# Patient Record
Sex: Male | Born: 1950
Health system: Southern US, Community
[De-identification: ages and names within clinical notes are randomized; demographics above are authoritative.]

## PROBLEM LIST (undated history)

## (undated) ENCOUNTER — Emergency Department (HOSPITAL_COMMUNITY): Admission: EM | Payer: BC Managed Care – PPO | Source: Home / Self Care

## (undated) DIAGNOSIS — E785 Hyperlipidemia, unspecified: Secondary | ICD-10-CM

## (undated) DIAGNOSIS — K5900 Constipation, unspecified: Secondary | ICD-10-CM

## (undated) DIAGNOSIS — G4733 Obstructive sleep apnea (adult) (pediatric): Secondary | ICD-10-CM

## (undated) DIAGNOSIS — I351 Nonrheumatic aortic (valve) insufficiency: Secondary | ICD-10-CM

## (undated) DIAGNOSIS — D649 Anemia, unspecified: Secondary | ICD-10-CM

## (undated) DIAGNOSIS — E559 Vitamin D deficiency, unspecified: Secondary | ICD-10-CM

## (undated) DIAGNOSIS — G8929 Other chronic pain: Secondary | ICD-10-CM

## (undated) DIAGNOSIS — I2510 Atherosclerotic heart disease of native coronary artery without angina pectoris: Secondary | ICD-10-CM

## (undated) DIAGNOSIS — F339 Major depressive disorder, recurrent, unspecified: Secondary | ICD-10-CM

## (undated) DIAGNOSIS — F419 Anxiety disorder, unspecified: Secondary | ICD-10-CM

## (undated) DIAGNOSIS — G2581 Restless legs syndrome: Secondary | ICD-10-CM

## (undated) DIAGNOSIS — G4761 Periodic limb movement disorder: Secondary | ICD-10-CM

## (undated) DIAGNOSIS — F429 Obsessive-compulsive disorder, unspecified: Secondary | ICD-10-CM

## (undated) DIAGNOSIS — E669 Obesity, unspecified: Secondary | ICD-10-CM

## (undated) DIAGNOSIS — T7840XA Allergy, unspecified, initial encounter: Secondary | ICD-10-CM

## (undated) DIAGNOSIS — R7303 Prediabetes: Secondary | ICD-10-CM

## (undated) DIAGNOSIS — I6529 Occlusion and stenosis of unspecified carotid artery: Secondary | ICD-10-CM

## (undated) DIAGNOSIS — E7800 Pure hypercholesterolemia, unspecified: Secondary | ICD-10-CM

## (undated) DIAGNOSIS — E739 Lactose intolerance, unspecified: Secondary | ICD-10-CM

## (undated) DIAGNOSIS — G4730 Sleep apnea, unspecified: Secondary | ICD-10-CM

## (undated) HISTORY — DX: Anemia, unspecified: D64.9

## (undated) HISTORY — DX: Allergy, unspecified, initial encounter: T78.40XA

## (undated) HISTORY — DX: Obstructive sleep apnea (adult) (pediatric): G47.33

## (undated) HISTORY — DX: Periodic limb movement disorder: G47.61

## (undated) HISTORY — DX: Obesity, unspecified: E66.9

## (undated) HISTORY — DX: Restless legs syndrome: G25.81

## (undated) HISTORY — DX: Major depressive disorder, recurrent, unspecified: F33.9

## (undated) HISTORY — DX: Occlusion and stenosis of unspecified carotid artery: I65.29

## (undated) HISTORY — DX: Sleep apnea, unspecified: G47.30

## (undated) HISTORY — DX: Pure hypercholesterolemia, unspecified: E78.00

## (undated) HISTORY — DX: Obsessive-compulsive disorder, unspecified: F42.9

## (undated) HISTORY — PX: COLONOSCOPY: SHX174

## (undated) HISTORY — DX: Atherosclerotic heart disease of native coronary artery without angina pectoris: I25.10

## (undated) HISTORY — PX: HEMORRHOID SURGERY: SHX153

## (undated) HISTORY — PX: DG BARIUM ENEMA (ARMC HX): HXRAD1447

## (undated) HISTORY — DX: Vitamin D deficiency, unspecified: E55.9

## (undated) HISTORY — DX: Hyperlipidemia, unspecified: E78.5

## (undated) HISTORY — DX: Constipation, unspecified: K59.00

## (undated) HISTORY — DX: Lactose intolerance, unspecified: E73.9

## (undated) HISTORY — DX: Nonrheumatic aortic (valve) insufficiency: I35.1

## (undated) HISTORY — DX: Other chronic pain: G89.29

## (undated) HISTORY — DX: Anxiety disorder, unspecified: F41.9

## (undated) HISTORY — DX: Prediabetes: R73.03

---
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
## 1998-11-11 ENCOUNTER — Ambulatory Visit (HOSPITAL_COMMUNITY): Admission: RE | Admit: 1998-11-11 | Discharge: 1998-11-11 | Payer: Self-pay | Admitting: Neurology

## 1998-11-11 ENCOUNTER — Encounter: Payer: Self-pay | Admitting: Neurology

## 1999-02-27 ENCOUNTER — Encounter: Admission: RE | Admit: 1999-02-27 | Discharge: 1999-05-28 | Payer: Self-pay | Admitting: Family Medicine

## 1999-06-14 ENCOUNTER — Encounter: Admission: RE | Admit: 1999-06-14 | Discharge: 1999-09-12 | Payer: Self-pay | Admitting: Family Medicine

## 1999-11-09 ENCOUNTER — Encounter: Admission: RE | Admit: 1999-11-09 | Discharge: 2000-02-07 | Payer: Self-pay | Admitting: Family Medicine

## 2000-12-11 ENCOUNTER — Emergency Department (HOSPITAL_COMMUNITY): Admission: EM | Admit: 2000-12-11 | Discharge: 2000-12-11 | Payer: Self-pay

## 2003-02-08 ENCOUNTER — Ambulatory Visit (HOSPITAL_BASED_OUTPATIENT_CLINIC_OR_DEPARTMENT_OTHER): Admission: RE | Admit: 2003-02-08 | Discharge: 2003-02-08 | Payer: Self-pay | Admitting: Pulmonary Disease

## 2003-09-26 ENCOUNTER — Ambulatory Visit (HOSPITAL_BASED_OUTPATIENT_CLINIC_OR_DEPARTMENT_OTHER): Admission: RE | Admit: 2003-09-26 | Discharge: 2003-09-26 | Payer: Self-pay | Admitting: Pulmonary Disease

## 2003-12-15 ENCOUNTER — Ambulatory Visit: Payer: Self-pay

## 2003-12-16 ENCOUNTER — Emergency Department (HOSPITAL_COMMUNITY): Admission: EM | Admit: 2003-12-16 | Discharge: 2003-12-16 | Payer: Self-pay | Admitting: Internal Medicine

## 2003-12-18 ENCOUNTER — Emergency Department (HOSPITAL_COMMUNITY): Admission: EM | Admit: 2003-12-18 | Discharge: 2003-12-18 | Payer: Self-pay | Admitting: Emergency Medicine

## 2003-12-27 ENCOUNTER — Ambulatory Visit: Payer: Self-pay | Admitting: Pulmonary Disease

## 2004-04-13 ENCOUNTER — Ambulatory Visit: Payer: Self-pay | Admitting: Pulmonary Disease

## 2005-05-07 ENCOUNTER — Ambulatory Visit: Payer: Self-pay | Admitting: Pulmonary Disease

## 2005-05-14 ENCOUNTER — Ambulatory Visit (HOSPITAL_BASED_OUTPATIENT_CLINIC_OR_DEPARTMENT_OTHER): Admission: RE | Admit: 2005-05-14 | Discharge: 2005-05-14 | Payer: Self-pay | Admitting: Pulmonary Disease

## 2005-05-29 ENCOUNTER — Ambulatory Visit: Payer: Self-pay | Admitting: Pulmonary Disease

## 2005-07-17 ENCOUNTER — Emergency Department (HOSPITAL_COMMUNITY): Admission: EM | Admit: 2005-07-17 | Discharge: 2005-07-17 | Payer: Self-pay | Admitting: Emergency Medicine

## 2005-07-20 ENCOUNTER — Encounter (HOSPITAL_COMMUNITY): Admission: RE | Admit: 2005-07-20 | Discharge: 2005-10-04 | Payer: Self-pay | Admitting: Emergency Medicine

## 2006-09-09 ENCOUNTER — Ambulatory Visit: Payer: Self-pay | Admitting: Surgery

## 2010-03-17 ENCOUNTER — Emergency Department (HOSPITAL_COMMUNITY)
Admission: EM | Admit: 2010-03-17 | Discharge: 2010-03-18 | Disposition: A | Discharge status: Home / Self Care | Payer: BC Managed Care – PPO | Attending: Emergency Medicine | Admitting: Emergency Medicine

## 2010-03-17 DIAGNOSIS — Z79899 Other long term (current) drug therapy: Secondary | ICD-10-CM | POA: Insufficient documentation

## 2010-03-17 DIAGNOSIS — Z7982 Long term (current) use of aspirin: Secondary | ICD-10-CM | POA: Insufficient documentation

## 2010-03-17 DIAGNOSIS — K648 Other hemorrhoids: Secondary | ICD-10-CM | POA: Insufficient documentation

## 2010-06-06 NOTE — Procedures (Signed)
CAROTID DUPLEX EXAM   INDICATION:  Follow up carotid artery disease.   HISTORY:  Diabetes:  No  Cardiac:  No  Smoking:  No  Previous Surgery:  No  CV History:  Amaurosis Fugax No, Paresthesias No, Hemiparesis No   The patient reports multiple episodes of visual disturbances as if heat  waves are coming off pavement.  Also had dysarthria in past.                                       RIGHT             LEFT  Brachial systolic pressure:         140               142  Brachial Doppler waveforms:         Triphasic         Triphasic  Vertebral direction of flow:        Antegrade         Antegrade  DUPLEX VELOCITIES (cm/sec)  CCA peak systolic                   97                114  ECA peak systolic                   128               159  ICA peak systolic                   93                101  ICA end diastolic                   19                32  PLAQUE MORPHOLOGY:                  Intimal thickening                  Mixed  PLAQUE AMOUNT:                      None              Mild  PLAQUE LOCATION:                                      ICA/bifurcation   IMPRESSION:  No evidence of right ICA stenosis.  Evidence of 1-39% left ICA stenosis.  No significant changes from previous study.   ___________________________________________  Janetta Hora Fields, MD   AS/MEDQ  D:  09/09/2006  T:  09/10/2006  Job:  [id number]   cc:   Duncan Dull, M.D.

## 2010-06-09 NOTE — Procedures (Signed)
NAME:  Eric [name], VILAR NO.:  [id number]   MEDICAL RECORD NO.:  [id number]          PATIENT TYPE:  OUT   LOCATION:  SLEEP CENTER                 FACILITY:  [facility]   PHYSICIAN:  Marcelyn Bruins, M.D. [facility] DATE OF BIRTH:  [birth year]   DATE OF STUDY:  05/14/2005                              NOCTURNAL POLYSOMNOGRAM   REFERRING PHYSICIAN:  Dr. Danice Goltz   INDICATIONS FOR STUDY:  Hypersomnia with sleep apnea.  The patient returns  for pressure optimization.   EPWORTH SCORE:  11.   SLEEP ARCHITECTURE:  The patient had total sleep time of 378 minutes with  adequate REM but never achieved slow wave sleep.  Sleep onset latency was  normal and REM onset was at the upper limits of normal.  Sleep efficiency  was 92%.   RESPIRATORY DATA:  The patient underwent some CPAP titration by protocol  with his medium ResMed Ultra Mirage full face mask from home.  The patient  was titrated incrementally over the night in order to suppress both  obstructive events and snoring.  The final pressure of 17 cm of water the  patient seemed to have a very good response.   OXYGEN DATA:  There was O2 desaturation as low as 74% prior to achieving  optimal CPAP.   CARDIAC DATA:  Rare PAC.   MOVEMENT/PARASOMNIAS:  The patient was found to have 456 leg jerks with  seven per hour resulting in arousal awakening.   IMPRESSION/RECOMMENDATIONS:  1.  Good control of previously documented obstructive sleep apnea with 718      cm of CPAP and the patient's medium ResMed Ultra Mirage full face mask      from home.  2.  Very large numbers of leg jerks with significant sleep disruption that      did not resolve even on optimal CPAP.      It did improve however.  I have noted the patient is on Requip at home,      and perhaps even needs a higher dose or possibly his leg jerks are due      to another etiology.  Clinical correlation is suggested.           ______________________________  Marcelyn Bruins,  M.D. [facility]  Diplomate, American Board of Sleep  Medicine    KC/MEDQ  D:  05/29/2005 15:15:51  T:  05/30/2005 13:18:51  Job:  [id number]

## 2010-06-09 NOTE — Procedures (Signed)
NAME:  Eric [name], Eric [name] NO.:  [id number]   MEDICAL RECORD NO.:  [id number]          PATIENT TYPE:  OUT   LOCATION:  SLEEP CENTER                 FACILITY:  [facility]   PHYSICIAN:  Marcelyn Bruins, M.D. [facility] DATE OF BIRTH:  1950/09/26   DATE OF ADMISSION:  09/26/2003  DATE OF DISCHARGE:  09/26/2003                              NOCTURNAL POLYSOMNOGRAM   REFERRING PHYSICIAN:  Dr. Danice Goltz   INDICATION FOR THE STUDY:  The patient has a history of obstructive sleep  apnea and is returning for CPAP titration.   SLEEP ARCHITECTURE:  The patient had a total sleep time of 408 minutes with  decreased REM and slow wave sleep.  Sleep onset latency was prolonged at 61  minutes and REM latency was greatly prolonged at 219 minutes.   IMPRESSION:  1.  Moderate control of obstructive sleep apnea with a CPAP pressure of 7      cm.  The patient had definite breakthrough toward the end of the study      in the supine position and REM.  I would suggest a treatment pressure of      8-9 cm.  Patient used his ResMed Ultra Mirage mask from home.  2.  No clinically significant cardiac arrhythmias.  3.  Very large numbers of leg jerks with significant sleep disruption.      Clinical correlation is suggested.      KC/MEDQ  D:  10/14/2003 15:53:31  T:  10/15/2003 18:25:12  Job:  [id number]

## 2010-10-19 ENCOUNTER — Other Ambulatory Visit: Payer: Self-pay | Admitting: Gastroenterology

## 2010-10-19 ENCOUNTER — Other Ambulatory Visit (HOSPITAL_COMMUNITY): Payer: Self-pay | Admitting: Gastroenterology

## 2010-10-19 ENCOUNTER — Ambulatory Visit (HOSPITAL_COMMUNITY)
Admission: RE | Admit: 2010-10-19 | Discharge: 2010-10-19 | Disposition: A | Discharge status: Home / Self Care | Payer: BC Managed Care – PPO | Source: Ambulatory Visit | Attending: Gastroenterology | Admitting: Gastroenterology

## 2010-10-19 DIAGNOSIS — Z9889 Other specified postprocedural states: Secondary | ICD-10-CM

## 2010-10-19 DIAGNOSIS — K5730 Diverticulosis of large intestine without perforation or abscess without bleeding: Secondary | ICD-10-CM | POA: Insufficient documentation

## 2010-10-19 DIAGNOSIS — R195 Other fecal abnormalities: Secondary | ICD-10-CM | POA: Insufficient documentation

## 2011-04-13 ENCOUNTER — Telehealth: Payer: Self-pay | Admitting: *Deleted

## 2011-04-13 NOTE — Telephone Encounter (Signed)
Pt called requesting a genetics appt due to strong family hx because he has a daughter asking.  Confirmed 04/19/11 appt w/ pt.

## 2011-04-19 ENCOUNTER — Other Ambulatory Visit: Payer: BC Managed Care – PPO

## 2011-04-19 ENCOUNTER — Ambulatory Visit (HOSPITAL_BASED_OUTPATIENT_CLINIC_OR_DEPARTMENT_OTHER): Payer: BC Managed Care – PPO | Admitting: Genetic Counselor

## 2011-04-19 DIAGNOSIS — Z8041 Family history of malignant neoplasm of ovary: Secondary | ICD-10-CM

## 2011-04-19 DIAGNOSIS — Z809 Family history of malignant neoplasm, unspecified: Secondary | ICD-10-CM

## 2011-04-19 DIAGNOSIS — Z803 Family history of malignant neoplasm of breast: Secondary | ICD-10-CM

## 2011-04-19 DIAGNOSIS — IMO0002 Reserved for concepts with insufficient information to code with codable children: Secondary | ICD-10-CM

## 2011-04-19 NOTE — Progress Notes (Signed)
Dr.  Kevan Ny requested a consultation for genetic counseling and risk assessment for Eric [name], a 61 y.o. male, for discussion of his family history of cancer. He presents to clinic today, with his wife, to discuss the possibility of a genetic predisposition to cancer, and to further clarify his risks, as well as his family members' risks for cancer.   HISTORY OF PRESENT ILLNESS: Eric [name] is a 61 y.o. male with no personal history of cancer.    No past medical history on file.  No past surgical history on file.  History  Substance Use Topics  . Smoking status: Not on file  . Smokeless tobacco: Not on file  . Alcohol Use: Not on file    FAMILY HISTORY:  We obtained a detailed, 4-generation family history.  Significant diagnoses are listed below: The patient's mother was diagnosed with breast cancer at age 59 and then with ovarian cancer at age 27.  She died at age 12.  He has a maternal cousin who died of pancreatic cancer in his 26s and a maternal cousin who died of breast cancer in her 68s.  There is no other reported family history of cancer.  Patient's maternal ancestors are of [place] descent, and paternal ancestors are of [place] descent. There is no reported Ashkenazi Jewish ancestry. There is no  known consanguinity.  GENETIC COUNSELING RISK ASSESSMENT, DISCUSSION, AND SUGGESTED FOLLOW UP: We reviewed the natural history and genetic etiology of sporadic, familial and hereditary cancer syndromes.  Approximately 5-10% of breast cancer is hereditary; of this, about 85% is a result of BRCA1 or BRCA2 mutations.  We discussed the red flags of hereditary cancer syndromes and the dominant inheritance patterns.  The patient was concerned about repercussions of testing for his health insurance.  We reviewed the [place] law, and explained that insurance companies are willing to pay for testing for individuals who meet specific criteria.  The patient had lots of questions  about risks.  The patient's family history is suggestive of the following possible diagnosis: hereditary breast and ovarian cancer.  We discussed that identification of a hereditary cancer syndrome may help his care providers tailor the patients medical management. If a mutation indicating BRCA1 or BRCA2 mutation is detected in this case, the [employer] recommendations would include increased cancer screening. If a mutation is detected, the patient will be referred back to the referring provider and to any additional appropriate care providers to discuss the relevant options.   If a mutation is not found in the patient, this will decrease the likelihood of BRCA mutations as the explanation for his  Family history of cancer. Cancer surveillance options would be discussed for the patient according to the appropriate standard National Comprehensive Cancer Network and American Cancer Society guidelines, with consideration of their personal and family history risk factors. In this case, the patient will be referred back to their care providers for discussions of management.   Based on the patient's personal and family history, statistical models (Penn II)  and literature data were used to estimate his risk of having a BRCA1 or BRCA2 mutation.  This estimates that his risk for a BRCA1 mutation is approximately 4% and the risk for a BRCA2 mutation is 6%, for a combined total of 10% risk.  Based on this estimate genetic testing is recommended.   After considering the risks, benefits, and limitations, the patient decided to think about the test and discuss it with his family members.  The patient was seen for a total of 90 minutes, greater than 50% of which was spent face-to-face counseling.  This plan is being carried out per Dr. Kevan Ny' recommendations.  This note will also be sent to the referring provider via the electronic medical record. The patient will be supplied with a summary  of this genetic counseling discussion as well as educational information on the discussed hereditary cancer syndromes following the conclusion of their visit.   Patient was discussed with Dr. Drue Second.  EDUCATIONAL INFORMATION SUPPLIED TO PATIENT AT ENCOUNTER:  Hereditary Breast and Ovarian Cancer Syndrome brochure  _______________________________________________________________________ For Office Staff:  Number of people involved in session: 3 Was an Intern/ student involved with case: not applicable }

## 2011-05-29 ENCOUNTER — Encounter: Payer: Self-pay | Admitting: Genetic Counselor

## 2011-05-31 ENCOUNTER — Telehealth: Payer: Self-pay | Admitting: Oncology

## 2011-05-31 ENCOUNTER — Other Ambulatory Visit: Payer: BC Managed Care – PPO | Admitting: Lab

## 2011-05-31 NOTE — Telephone Encounter (Signed)
Pt called to schedule his bra1/bfca 2 lab appt. Transferred the pt over to lisa to set up that appt

## 2011-06-05 ENCOUNTER — Telehealth: Payer: Self-pay | Admitting: Genetic Counselor

## 2011-06-05 NOTE — Telephone Encounter (Signed)
Spoke with Rudi Rummage wife, about needing a copy of the insurance card in order to [employer] for genetic testing.  She will fax me over a copy of the insurance card.

## 2011-06-21 ENCOUNTER — Telehealth: Payer: Self-pay | Admitting: Genetic Counselor

## 2011-06-21 ENCOUNTER — Encounter: Payer: Self-pay | Admitting: Genetic Counselor

## 2011-06-21 NOTE — Telephone Encounter (Signed)
Revealed negative BRCA and BART test results. 

## 2011-06-26 ENCOUNTER — Encounter: Payer: Self-pay | Admitting: Genetic Counselor

## 2012-10-14 ENCOUNTER — Other Ambulatory Visit: Payer: Self-pay | Admitting: Family Medicine

## 2012-10-14 DIAGNOSIS — I779 Disorder of arteries and arterioles, unspecified: Secondary | ICD-10-CM

## 2012-10-15 ENCOUNTER — Ambulatory Visit
Admission: RE | Admit: 2012-10-15 | Discharge: 2012-10-15 | Disposition: A | Discharge status: Home / Self Care | Payer: BC Managed Care – PPO | Source: Ambulatory Visit | Attending: Family Medicine | Admitting: Family Medicine

## 2012-10-15 DIAGNOSIS — I779 Disorder of arteries and arterioles, unspecified: Secondary | ICD-10-CM

## 2012-10-22 ENCOUNTER — Encounter: Payer: Self-pay | Admitting: Vascular Surgery

## 2012-10-28 ENCOUNTER — Ambulatory Visit: Payer: BC Managed Care – PPO | Admitting: Interventional Cardiology

## 2012-11-11 ENCOUNTER — Ambulatory Visit: Payer: BC Managed Care – PPO | Admitting: Interventional Cardiology

## 2012-11-19 ENCOUNTER — Encounter: Payer: Self-pay | Admitting: Vascular Surgery

## 2012-11-20 ENCOUNTER — Encounter: Payer: BC Managed Care – PPO | Admitting: Vascular Surgery

## 2012-12-16 ENCOUNTER — Encounter: Payer: BC Managed Care – PPO | Admitting: Vascular Surgery

## 2012-12-22 ENCOUNTER — Ambulatory Visit: Payer: BC Managed Care – PPO | Admitting: Interventional Cardiology

## 2014-01-05 ENCOUNTER — Ambulatory Visit (INDEPENDENT_AMBULATORY_CARE_PROVIDER_SITE_OTHER): Payer: BC Managed Care – PPO | Admitting: Podiatry

## 2014-01-05 ENCOUNTER — Ambulatory Visit (INDEPENDENT_AMBULATORY_CARE_PROVIDER_SITE_OTHER): Payer: BC Managed Care – PPO

## 2014-01-05 ENCOUNTER — Encounter: Payer: Self-pay | Admitting: Podiatry

## 2014-01-05 DIAGNOSIS — M722 Plantar fascial fibromatosis: Secondary | ICD-10-CM

## 2014-01-05 DIAGNOSIS — L603 Nail dystrophy: Secondary | ICD-10-CM

## 2014-01-05 DIAGNOSIS — R52 Pain, unspecified: Secondary | ICD-10-CM

## 2014-01-05 DIAGNOSIS — L600 Ingrowing nail: Secondary | ICD-10-CM

## 2014-01-05 NOTE — Progress Notes (Signed)
   Subjective:    Patient ID: Eric [name], male    DOB: 1950/05/14, 63 y.o.   MRN: [id number]  HPI  PT STATED LT FOOT BACK OF THE HEEL BEEN PAINFUL FOR ON AND OFF SINCE 2013. THE HEEL IS BEEN THE SAME BUT SOMETIMES GET WORSE. THE FOOT GET AGGRAVATED BY WALKING AND TRIED WEARING ORTHOTICS.  ALSO, CHECK LT 3RD TOENAIL IS THICK.  Review of Systems  Skin: Positive for color change.  All other systems reviewed and are negative.      Objective:   Physical Exam: I have reviewed his past medical history medications allergies surgery social history and review of systems. Pulses are strongly palpable bilateral. Neurologic sensorium is intact per Semmes-Weinstein monofilament. Deep tendon reflexes are intact bilaterally muscle strength is 5 over 5 dorsiflexion plantar flexion and inversion everters all edges of musculature is intact. Orthopedic evaluation demonstrates all joints distal to the angle full range of motion without crepitation. He has mild tenderness on palpation of the medial calcaneal tubercle left greater than right. Cutaneous evaluation Mr. supple hydrated uterus with exception of the third digital nail plate left which restraints discoloration as well as a raised appearance that extends from proximal to distal. No tinea pedis. He does relate jock itch.        Assessment & Plan:  Assessment: Plantar fasciitis bilateral. Nail dystrophy with ingrown nail third digit left foot.  Plan: He was scanned for another set of orthotics today. We removed the toenail today in total and dressed with a dry sterile compressive dressing at which time he received metformin home-going instructions for the care of the toe and he will start topical antifungal once the wound heals. I will follow-up with him and his orthotics come in.

## 2014-01-05 NOTE — Patient Instructions (Signed)

## 2014-01-12 ENCOUNTER — Encounter: Payer: Self-pay | Admitting: Podiatry

## 2014-01-12 ENCOUNTER — Ambulatory Visit (INDEPENDENT_AMBULATORY_CARE_PROVIDER_SITE_OTHER): Payer: BC Managed Care – PPO | Admitting: Podiatry

## 2014-01-12 VITALS — BP 125/72 | HR 70 | Resp 16

## 2014-01-12 DIAGNOSIS — Z9889 Other specified postprocedural states: Secondary | ICD-10-CM

## 2014-01-12 NOTE — Progress Notes (Signed)
He presents today for follow-up of his nail avulsion third digit left foot. He states that seems to be healing. He is ready to start applying the econazole cream.  Objective: Vital signs are stable he is alert and oriented 3 there is no erythema edema cellulitis drainage or odor the nail bed appears to be healing well. There is be healing well.  Assessment: Well-healing surgical toe third left.  Plan: Continue so Epsom salts and warm water until completely dried out. He will then start application of a consult cream twice daily until the nail has grown out completely. Should this recur he will contemplate total nail avulsion with matrixectomy.

## 2014-06-25 ENCOUNTER — Other Ambulatory Visit: Payer: Self-pay

## 2014-06-25 ENCOUNTER — Ambulatory Visit: Payer: Self-pay | Admitting: *Deleted

## 2014-06-25 DIAGNOSIS — M722 Plantar fascial fibromatosis: Secondary | ICD-10-CM

## 2014-06-25 NOTE — Progress Notes (Signed)
Pt is here to PUO 

## 2014-06-25 NOTE — Patient Instructions (Signed)

## 2015-05-16 DIAGNOSIS — G4733 Obstructive sleep apnea (adult) (pediatric): Secondary | ICD-10-CM | POA: Diagnosis not present

## 2015-05-16 DIAGNOSIS — G2581 Restless legs syndrome: Secondary | ICD-10-CM | POA: Diagnosis not present

## 2015-05-19 DIAGNOSIS — G4733 Obstructive sleep apnea (adult) (pediatric): Secondary | ICD-10-CM | POA: Diagnosis not present

## 2015-06-06 DIAGNOSIS — G4733 Obstructive sleep apnea (adult) (pediatric): Secondary | ICD-10-CM | POA: Diagnosis not present

## 2015-07-28 DIAGNOSIS — G4761 Periodic limb movement disorder: Secondary | ICD-10-CM | POA: Diagnosis not present

## 2015-07-28 DIAGNOSIS — G4733 Obstructive sleep apnea (adult) (pediatric): Secondary | ICD-10-CM | POA: Diagnosis not present

## 2015-07-28 DIAGNOSIS — G2581 Restless legs syndrome: Secondary | ICD-10-CM | POA: Diagnosis not present

## 2015-08-17 DIAGNOSIS — E669 Obesity, unspecified: Secondary | ICD-10-CM | POA: Diagnosis not present

## 2015-09-07 DIAGNOSIS — E669 Obesity, unspecified: Secondary | ICD-10-CM | POA: Diagnosis not present

## 2015-10-11 DIAGNOSIS — Z23 Encounter for immunization: Secondary | ICD-10-CM | POA: Diagnosis not present

## 2015-10-12 DIAGNOSIS — E669 Obesity, unspecified: Secondary | ICD-10-CM | POA: Diagnosis not present

## 2015-11-04 DIAGNOSIS — G4733 Obstructive sleep apnea (adult) (pediatric): Secondary | ICD-10-CM | POA: Diagnosis not present

## 2015-11-11 DIAGNOSIS — I6522 Occlusion and stenosis of left carotid artery: Secondary | ICD-10-CM | POA: Diagnosis not present

## 2015-11-11 DIAGNOSIS — I779 Disorder of arteries and arterioles, unspecified: Secondary | ICD-10-CM | POA: Diagnosis not present

## 2015-11-23 DIAGNOSIS — E669 Obesity, unspecified: Secondary | ICD-10-CM | POA: Diagnosis not present

## 2015-11-28 DIAGNOSIS — H2513 Age-related nuclear cataract, bilateral: Secondary | ICD-10-CM | POA: Diagnosis not present

## 2015-11-28 DIAGNOSIS — H5213 Myopia, bilateral: Secondary | ICD-10-CM | POA: Diagnosis not present

## 2015-11-28 DIAGNOSIS — H52203 Unspecified astigmatism, bilateral: Secondary | ICD-10-CM | POA: Diagnosis not present

## 2015-11-28 DIAGNOSIS — D3131 Benign neoplasm of right choroid: Secondary | ICD-10-CM | POA: Diagnosis not present

## 2015-12-05 DIAGNOSIS — G4733 Obstructive sleep apnea (adult) (pediatric): Secondary | ICD-10-CM | POA: Diagnosis not present

## 2015-12-07 DIAGNOSIS — G4733 Obstructive sleep apnea (adult) (pediatric): Secondary | ICD-10-CM | POA: Diagnosis not present

## 2015-12-08 DIAGNOSIS — L821 Other seborrheic keratosis: Secondary | ICD-10-CM | POA: Diagnosis not present

## 2015-12-08 DIAGNOSIS — L814 Other melanin hyperpigmentation: Secondary | ICD-10-CM | POA: Diagnosis not present

## 2015-12-08 DIAGNOSIS — D225 Melanocytic nevi of trunk: Secondary | ICD-10-CM | POA: Diagnosis not present

## 2015-12-08 DIAGNOSIS — L570 Actinic keratosis: Secondary | ICD-10-CM | POA: Diagnosis not present

## 2015-12-08 DIAGNOSIS — D1801 Hemangioma of skin and subcutaneous tissue: Secondary | ICD-10-CM | POA: Diagnosis not present

## 2015-12-14 DIAGNOSIS — Z13228 Encounter for screening for other metabolic disorders: Secondary | ICD-10-CM | POA: Diagnosis not present

## 2015-12-14 DIAGNOSIS — Z0000 Encounter for general adult medical examination without abnormal findings: Secondary | ICD-10-CM | POA: Diagnosis not present

## 2015-12-14 DIAGNOSIS — E7800 Pure hypercholesterolemia, unspecified: Secondary | ICD-10-CM | POA: Diagnosis not present

## 2015-12-14 DIAGNOSIS — R7303 Prediabetes: Secondary | ICD-10-CM | POA: Diagnosis not present

## 2015-12-19 DIAGNOSIS — E7800 Pure hypercholesterolemia, unspecified: Secondary | ICD-10-CM | POA: Diagnosis not present

## 2015-12-19 DIAGNOSIS — K921 Melena: Secondary | ICD-10-CM | POA: Diagnosis not present

## 2015-12-19 DIAGNOSIS — E669 Obesity, unspecified: Secondary | ICD-10-CM | POA: Diagnosis not present

## 2015-12-19 DIAGNOSIS — Z0000 Encounter for general adult medical examination without abnormal findings: Secondary | ICD-10-CM | POA: Diagnosis not present

## 2015-12-19 DIAGNOSIS — R7303 Prediabetes: Secondary | ICD-10-CM | POA: Diagnosis not present

## 2016-01-04 DIAGNOSIS — E669 Obesity, unspecified: Secondary | ICD-10-CM | POA: Diagnosis not present

## 2016-01-06 DIAGNOSIS — H00016 Hordeolum externum left eye, unspecified eyelid: Secondary | ICD-10-CM | POA: Diagnosis not present

## 2016-02-01 DIAGNOSIS — E669 Obesity, unspecified: Secondary | ICD-10-CM | POA: Diagnosis not present

## 2016-02-14 DIAGNOSIS — M25561 Pain in right knee: Secondary | ICD-10-CM | POA: Diagnosis not present

## 2016-02-14 DIAGNOSIS — M170 Bilateral primary osteoarthritis of knee: Secondary | ICD-10-CM | POA: Diagnosis not present

## 2016-02-14 DIAGNOSIS — G8929 Other chronic pain: Secondary | ICD-10-CM | POA: Diagnosis not present

## 2016-02-14 DIAGNOSIS — M25562 Pain in left knee: Secondary | ICD-10-CM | POA: Diagnosis not present

## 2016-02-14 DIAGNOSIS — M1711 Unilateral primary osteoarthritis, right knee: Secondary | ICD-10-CM | POA: Diagnosis not present

## 2016-02-29 DIAGNOSIS — E669 Obesity, unspecified: Secondary | ICD-10-CM | POA: Diagnosis not present

## 2016-03-22 DIAGNOSIS — M1712 Unilateral primary osteoarthritis, left knee: Secondary | ICD-10-CM | POA: Diagnosis not present

## 2016-03-22 DIAGNOSIS — M1711 Unilateral primary osteoarthritis, right knee: Secondary | ICD-10-CM | POA: Diagnosis not present

## 2016-04-11 DIAGNOSIS — E669 Obesity, unspecified: Secondary | ICD-10-CM | POA: Diagnosis not present

## 2016-05-09 DIAGNOSIS — E669 Obesity, unspecified: Secondary | ICD-10-CM | POA: Diagnosis not present

## 2016-06-06 DIAGNOSIS — E669 Obesity, unspecified: Secondary | ICD-10-CM | POA: Diagnosis not present

## 2016-06-06 DIAGNOSIS — G4733 Obstructive sleep apnea (adult) (pediatric): Secondary | ICD-10-CM | POA: Diagnosis not present

## 2016-07-20 DIAGNOSIS — G4733 Obstructive sleep apnea (adult) (pediatric): Secondary | ICD-10-CM | POA: Diagnosis not present

## 2016-08-02 DIAGNOSIS — G4733 Obstructive sleep apnea (adult) (pediatric): Secondary | ICD-10-CM | POA: Diagnosis not present

## 2016-08-07 DIAGNOSIS — G4733 Obstructive sleep apnea (adult) (pediatric): Secondary | ICD-10-CM | POA: Diagnosis not present

## 2016-09-03 DIAGNOSIS — M1711 Unilateral primary osteoarthritis, right knee: Secondary | ICD-10-CM | POA: Diagnosis not present

## 2016-09-03 DIAGNOSIS — M25562 Pain in left knee: Secondary | ICD-10-CM | POA: Diagnosis not present

## 2016-09-03 DIAGNOSIS — M25561 Pain in right knee: Secondary | ICD-10-CM | POA: Diagnosis not present

## 2016-09-03 DIAGNOSIS — M1712 Unilateral primary osteoarthritis, left knee: Secondary | ICD-10-CM | POA: Diagnosis not present

## 2016-09-03 DIAGNOSIS — M170 Bilateral primary osteoarthritis of knee: Secondary | ICD-10-CM | POA: Diagnosis not present

## 2016-09-05 DIAGNOSIS — E669 Obesity, unspecified: Secondary | ICD-10-CM | POA: Diagnosis not present

## 2016-09-26 DIAGNOSIS — E669 Obesity, unspecified: Secondary | ICD-10-CM | POA: Diagnosis not present

## 2016-10-24 DIAGNOSIS — E669 Obesity, unspecified: Secondary | ICD-10-CM | POA: Diagnosis not present

## 2016-11-02 DIAGNOSIS — Z23 Encounter for immunization: Secondary | ICD-10-CM | POA: Diagnosis not present

## 2016-11-09 DIAGNOSIS — I6522 Occlusion and stenosis of left carotid artery: Secondary | ICD-10-CM | POA: Diagnosis not present

## 2016-11-09 DIAGNOSIS — I779 Disorder of arteries and arterioles, unspecified: Secondary | ICD-10-CM | POA: Diagnosis not present

## 2016-11-13 DIAGNOSIS — H903 Sensorineural hearing loss, bilateral: Secondary | ICD-10-CM | POA: Diagnosis not present

## 2016-12-11 DIAGNOSIS — D225 Melanocytic nevi of trunk: Secondary | ICD-10-CM | POA: Diagnosis not present

## 2016-12-11 DIAGNOSIS — L814 Other melanin hyperpigmentation: Secondary | ICD-10-CM | POA: Diagnosis not present

## 2016-12-11 DIAGNOSIS — L821 Other seborrheic keratosis: Secondary | ICD-10-CM | POA: Diagnosis not present

## 2016-12-11 DIAGNOSIS — D1801 Hemangioma of skin and subcutaneous tissue: Secondary | ICD-10-CM | POA: Diagnosis not present

## 2016-12-24 DIAGNOSIS — E7800 Pure hypercholesterolemia, unspecified: Secondary | ICD-10-CM | POA: Diagnosis not present

## 2016-12-24 DIAGNOSIS — R7303 Prediabetes: Secondary | ICD-10-CM | POA: Diagnosis not present

## 2016-12-24 DIAGNOSIS — Z125 Encounter for screening for malignant neoplasm of prostate: Secondary | ICD-10-CM | POA: Diagnosis not present

## 2016-12-24 DIAGNOSIS — Z5181 Encounter for therapeutic drug level monitoring: Secondary | ICD-10-CM | POA: Diagnosis not present

## 2016-12-24 DIAGNOSIS — E559 Vitamin D deficiency, unspecified: Secondary | ICD-10-CM | POA: Diagnosis not present

## 2016-12-26 DIAGNOSIS — E7800 Pure hypercholesterolemia, unspecified: Secondary | ICD-10-CM | POA: Diagnosis not present

## 2016-12-26 DIAGNOSIS — R8290 Unspecified abnormal findings in urine: Secondary | ICD-10-CM | POA: Diagnosis not present

## 2016-12-26 DIAGNOSIS — Z23 Encounter for immunization: Secondary | ICD-10-CM | POA: Diagnosis not present

## 2016-12-26 DIAGNOSIS — Z0000 Encounter for general adult medical examination without abnormal findings: Secondary | ICD-10-CM | POA: Diagnosis not present

## 2016-12-26 DIAGNOSIS — E559 Vitamin D deficiency, unspecified: Secondary | ICD-10-CM | POA: Diagnosis not present

## 2016-12-26 DIAGNOSIS — R739 Hyperglycemia, unspecified: Secondary | ICD-10-CM | POA: Diagnosis not present

## 2017-01-22 DIAGNOSIS — H00013 Hordeolum externum right eye, unspecified eyelid: Secondary | ICD-10-CM | POA: Diagnosis not present

## 2017-04-08 DIAGNOSIS — H52203 Unspecified astigmatism, bilateral: Secondary | ICD-10-CM | POA: Diagnosis not present

## 2017-04-08 DIAGNOSIS — H524 Presbyopia: Secondary | ICD-10-CM | POA: Diagnosis not present

## 2017-04-08 DIAGNOSIS — D3131 Benign neoplasm of right choroid: Secondary | ICD-10-CM | POA: Diagnosis not present

## 2017-04-08 DIAGNOSIS — H2513 Age-related nuclear cataract, bilateral: Secondary | ICD-10-CM | POA: Diagnosis not present

## 2017-04-08 DIAGNOSIS — H0012 Chalazion right lower eyelid: Secondary | ICD-10-CM | POA: Diagnosis not present

## 2017-04-08 DIAGNOSIS — H5213 Myopia, bilateral: Secondary | ICD-10-CM | POA: Diagnosis not present

## 2017-04-09 DIAGNOSIS — Z79899 Other long term (current) drug therapy: Secondary | ICD-10-CM | POA: Diagnosis not present

## 2017-04-09 DIAGNOSIS — F331 Major depressive disorder, recurrent, moderate: Secondary | ICD-10-CM | POA: Diagnosis not present

## 2017-05-22 DIAGNOSIS — E669 Obesity, unspecified: Secondary | ICD-10-CM | POA: Diagnosis not present

## 2017-05-30 DIAGNOSIS — H00016 Hordeolum externum left eye, unspecified eyelid: Secondary | ICD-10-CM | POA: Diagnosis not present

## 2017-05-30 DIAGNOSIS — G4733 Obstructive sleep apnea (adult) (pediatric): Secondary | ICD-10-CM | POA: Diagnosis not present

## 2017-08-08 DIAGNOSIS — G4733 Obstructive sleep apnea (adult) (pediatric): Secondary | ICD-10-CM | POA: Diagnosis not present

## 2017-08-08 DIAGNOSIS — H01009 Unspecified blepharitis unspecified eye, unspecified eyelid: Secondary | ICD-10-CM | POA: Diagnosis not present

## 2017-08-14 DIAGNOSIS — F331 Major depressive disorder, recurrent, moderate: Secondary | ICD-10-CM | POA: Diagnosis not present

## 2017-10-15 DIAGNOSIS — F429 Obsessive-compulsive disorder, unspecified: Secondary | ICD-10-CM

## 2017-10-18 ENCOUNTER — Encounter: Payer: Self-pay | Admitting: *Deleted

## 2017-10-24 DIAGNOSIS — G4733 Obstructive sleep apnea (adult) (pediatric): Secondary | ICD-10-CM | POA: Diagnosis not present

## 2017-10-25 DIAGNOSIS — Z23 Encounter for immunization: Secondary | ICD-10-CM | POA: Diagnosis not present

## 2017-11-01 ENCOUNTER — Encounter: Payer: Self-pay | Admitting: Psychiatry

## 2017-11-01 ENCOUNTER — Ambulatory Visit: Payer: No Typology Code available for payment source | Admitting: Psychiatry

## 2017-11-01 VITALS — BP 153/70 | HR 67

## 2017-11-01 DIAGNOSIS — G4733 Obstructive sleep apnea (adult) (pediatric): Secondary | ICD-10-CM | POA: Diagnosis not present

## 2017-11-01 DIAGNOSIS — G2581 Restless legs syndrome: Secondary | ICD-10-CM

## 2017-11-01 DIAGNOSIS — F422 Mixed obsessional thoughts and acts: Secondary | ICD-10-CM | POA: Diagnosis not present

## 2017-11-01 DIAGNOSIS — N529 Male erectile dysfunction, unspecified: Secondary | ICD-10-CM

## 2017-11-01 DIAGNOSIS — F331 Major depressive disorder, recurrent, moderate: Secondary | ICD-10-CM | POA: Diagnosis not present

## 2017-11-01 DIAGNOSIS — R7989 Other specified abnormal findings of blood chemistry: Secondary | ICD-10-CM

## 2017-11-01 MED ORDER — LITHIUM CARBONATE 600 MG PO CAPS: 600.0000 mg | ORAL_CAPSULE | ORAL | 1 refills | Status: DC

## 2017-11-01 MED ORDER — ROPINIROLE HCL 1 MG PO TABS: 3.0000 mg | ORAL_TABLET | Freq: Every day | ORAL | 2 refills | Status: DC

## 2017-11-01 NOTE — Progress Notes (Signed)
[facility]  Patient ID: Eric [name],  MRN: [id number]  PCP: Shaune Pollack, MD  Date of Evaluation: 11/01/2017 Time spent:30 minutes   HISTORY/CURRENT STATUS: HPI At his last visit we changed the ropinirole to pramipexole in hopes of getting some antidepressant effects from the pramipexole.  He did not notice any mood effects.  We had also increase the lithium level.   CC sexual SE. Increased ED.  Disc Lexapro.  Wonders if it's pramipexole.  Did not have it on ropiinirole.  Lifelong fidgety that is not uncomfortable to him.  Still fidgety.  Took ropinirole bc nHS fidgetiness bothered wife's sleep.  No px between transition from ropinirole to pramipexole.  Thinks ropinirole controls RLS better with no change in mood.  OCD no worse with lower Lexapro.  Individual Medical History/ Review of Systems: Changes? :Yes using cpap  Allergies: Erythromycin  Current Medications:  Current Outpatient Medications:  .  Armodafinil 250 MG tablet, Take 250 mg by mouth 2 (two) times daily. qam & noon, Disp: , Rfl:  .  aspirin 81 MG tablet, Take 81 mg by mouth daily., Disp: , Rfl:  .  buPROPion (WELLBUTRIN XL) 150 MG 24 hr tablet, Take 450 mg by mouth daily. , Disp: , Rfl:  .  Cholecalciferol (VITAMIN D-3) 5000 units TABS, Take 5,000 Units by mouth daily., Disp: , Rfl:  .  co-enzyme Q-10 30 MG capsule, Take 30 mg by mouth daily., Disp: , Rfl:  .  CRESTOR 5 MG tablet, Take 5 mg by mouth daily., Disp: , Rfl: 2 .  escitalopram (LEXAPRO) 20 MG tablet, Take 10 mg by mouth daily. , Disp: , Rfl:  .  lithium carbonate 600 MG capsule, Take 1 capsule (600 mg total) by mouth every morning. tk 3, Disp: 90 capsule, Rfl: 1 .  rosuvastatin (CRESTOR) 10 MG tablet, Take 10 mg by mouth daily., Disp: , Rfl:  .  desonide (DESOWEN) 0.05 % ointment, , Disp: , Rfl: 0 .  econazole nitrate 1 % cream, Apply topically daily., Disp: , Rfl:  .  LACTOBACILLUS PO, Take by mouth., Disp: , Rfl:  .  mupirocin  ointment (BACTROBAN) 2 %, , Disp: , Rfl: 1 .  rOPINIRole (REQUIP) 1 MG tablet, Take 3 tablets (3 mg total) by mouth at bedtime., Disp: 90 tablet, Rfl: 2 Medication Side Effects: Other: sexual ED    Family Medical/ Social History: Changes? Yes  PE December with Dr. Kevan Ny who will be retiring.  MENTAL HEALTH EXAM:  Blood pressure (!) 153/70, pulse 67.There is no height or weight on file to calculate BMI.  General Appearance: Casual and fidgety  Eye Contact:  Good  Speech:  Normal Rate  Volume:  Normal  Mood:  Anxious and Depressed  Affect:  Appropriate  Thought Process:  Goal Directed  Orientation:  Full (Time, Place, and Person)  Thought Content: WDL   Suicidal Thoughts:  No  Homicidal Thoughts:  No  Memory:  Recent  Judgement:  Good  Insight:  Good  Psychomotor Activity:  Restlessness  Concentration:  Concentration: Good  Recall:  Good  Fund of Knowledge: Good  Language: Good  Akathisia:  Yes  AIMS (if indicated): not done  Assets:  Architect Leisure Time Social Support Talents/Skills Vocational/Educational  ADL's:  Intact  Cognition: WNL  Prognosis:  Good    DIAGNOSES:    ICD-10-CM   1. Mixed obsessional thoughts and acts F42.2   2. Low vitamin D level R79.89   3.  Major depressive disorder, recurrent episode, moderate (HCC) F33.1   4. Obstructive sleep apnea G47.33   5. Inability to maintain erection N52.9   6. Restless legs syndrome (RLS) G25.81 rOPINIRole (REQUIP) 1 MG tablet   Li 0.5 this is an acceptable level.  RECOMMENDATIONS:  Disc sexual SE occ caused by pramipexole as well as Lexapro.  Asked questions about this. It appears that the switch from ropinirole to pramipexole must have caused sexual side effects with difficulties with erection.  Discussed possible treatment of this versus switching back to ropinirole. DC pramipexole and return to ropinirole 2mg  q pm.  Disc CPAP use and it's importance for his  mental health as well as his physical health.  Asked questions about testosterone.  Discussed advantages and disavantages of treating low T.    Rec check level.  He will talk to his primary care doctor about this at his physical.  Repeat vitamin D.  Discussed the importance of vitamin D for mental health Zeferino has chronic anxiety and fluctuating symptoms in addition to depression and he feels the need for every 3 week follow-up over the long-term.  Psychotherapy focused on stress management.  Also cognitive techniques.  Lauraine Rinne, MD

## 2017-11-06 ENCOUNTER — Telehealth: Payer: Self-pay | Admitting: Psychiatry

## 2017-11-06 MED ORDER — RISPERIDONE 0.5 MG PO TABS: 0.5000 mg | ORAL_TABLET | Freq: Two times a day (BID) | ORAL | 1 refills | Status: DC | PRN

## 2017-11-06 NOTE — Telephone Encounter (Signed)
Patient forgot his medications last night and feel anxious today.  Asks about taking Risperdal initially 0.25 mg now for anxiety as it is been helpful in the past.  Told him to go ahead and take his evening's Lexapro now because he may also be experience some SSRI withdrawal but yes take risperidone 0.25 mg now and as needed for anxiety is okay.  He agreed.  Meredith Staggers MD

## 2017-11-07 DIAGNOSIS — H903 Sensorineural hearing loss, bilateral: Secondary | ICD-10-CM | POA: Diagnosis not present

## 2017-11-08 DIAGNOSIS — I739 Peripheral vascular disease, unspecified: Secondary | ICD-10-CM | POA: Diagnosis not present

## 2017-11-12 ENCOUNTER — Encounter: Payer: Self-pay | Admitting: Emergency Medicine

## 2017-11-18 ENCOUNTER — Other Ambulatory Visit: Payer: Self-pay | Admitting: Psychiatry

## 2017-11-18 DIAGNOSIS — L989 Disorder of the skin and subcutaneous tissue, unspecified: Secondary | ICD-10-CM | POA: Diagnosis not present

## 2017-11-22 ENCOUNTER — Encounter: Payer: Self-pay | Admitting: Psychiatry

## 2017-11-22 ENCOUNTER — Ambulatory Visit: Payer: No Typology Code available for payment source | Admitting: Psychiatry

## 2017-11-22 DIAGNOSIS — F5221 Male erectile disorder: Secondary | ICD-10-CM | POA: Diagnosis not present

## 2017-11-22 DIAGNOSIS — G2581 Restless legs syndrome: Secondary | ICD-10-CM

## 2017-11-22 DIAGNOSIS — R7989 Other specified abnormal findings of blood chemistry: Secondary | ICD-10-CM

## 2017-11-22 DIAGNOSIS — F422 Mixed obsessional thoughts and acts: Secondary | ICD-10-CM | POA: Diagnosis not present

## 2017-11-22 DIAGNOSIS — F331 Major depressive disorder, recurrent, moderate: Secondary | ICD-10-CM

## 2017-11-22 DIAGNOSIS — G4733 Obstructive sleep apnea (adult) (pediatric): Secondary | ICD-10-CM | POA: Diagnosis not present

## 2017-11-22 MED ORDER — LITHIUM CARBONATE 150 MG PO CAPS: 450.0000 mg | ORAL_CAPSULE | Freq: Every day | ORAL | 1 refills | Status: DC

## 2017-11-22 MED ORDER — MODAFINIL 200 MG PO TABS: 200.0000 mg | ORAL_TABLET | Freq: Two times a day (BID) | ORAL | 5 refills | Status: DC

## 2017-11-22 NOTE — Progress Notes (Signed)
Eric [name] [id number] [birth year] 67 y.o.  Subjective:   Patient ID:  Eric [name] is a 67 y.o. (DOB Feb 09, 1950) male.  Chief Complaint:  Chief Complaint  Patient presents with  . Medication Problem    sexual side effects  . Anxiety  . Sleeping Problem    RLS, OSA  . Depression    HPI Eric [name] presents to the office today for follow-up of OCD, Depression RLS, sexual problems.  Sexual px are a primary concern.  He wants to reduce lithium to 150 to see if sex function is better. Took pramipexole for 2 weeks without any changes in RLS or mood.  Stopped it and returned to ropinirole bc he read it could cause sexual SE.  Still having sexual px and reduced lithium and is trying to skip modafanil for the same reasons.  Still problems including erection and difficulty finishing.  Bothers wife.  Asked about various med causes.  Could function Sunday but not since. Restarted Risperidone 0.25 in am bc he felt it made hime feel a bit better re anxiety and mood.  Called wife per his request.  She thinks he's been a little better than usual.  Constant background worry seems the same.  Worry re: [name] and retirement things.  She wonders about mindfulness.  Pt reports that mood is Anxious and Depressed and describes anxiety as Moderate. Anxiety symptoms include: Excessive Worry, Obsessive Compulsive Symptoms:   Checking,,. Pt reports no sleep issues. Pt reports that appetite is good. Pt reports that energy is good and good. Concentration is good. Suicidal thoughts:  denied by patient.   Review of Systems:  Review of Systems  Neurological: Positive for tremors. Negative for weakness.  Psychiatric/Behavioral: Positive for dysphoric mood. Negative for agitation, behavioral problems, confusion, decreased concentration, hallucinations, self-injury, sleep disturbance and suicidal ideas. The patient is nervous/anxious. The patient is not hyperactive.   Tremor intentional is  mild.  Medications: I have reviewed the patient's current medications.  Current Outpatient Medications  Medication Sig Dispense Refill  . Armodafinil 250 MG tablet Take 250 mg by mouth 2 (two) times daily. qam & noon    . aspirin 81 MG tablet Take 81 mg by mouth daily.    Marland Kitchen buPROPion (WELLBUTRIN XL) 150 MG 24 hr tablet Take 450 mg by mouth daily.     . Cholecalciferol (VITAMIN D-3) 5000 units TABS Take 5,000 Units by mouth daily.    Marland Kitchen co-enzyme Q-10 30 MG capsule Take 30 mg by mouth daily.    . CRESTOR 5 MG tablet Take 5 mg by mouth daily.  2  . escitalopram (LEXAPRO) 20 MG tablet Take 10 mg by mouth daily.     Marland Kitchen LACTOBACILLUS PO Take by mouth.    . lithium carbonate 150 MG capsule Take 450 mg by mouth at bedtime.     . modafinil (PROVIGIL) 200 MG tablet Take 200 mg by mouth every morning. 1 qam and 1 noon    . risperiDONE (RISPERDAL) 0.25 MG tablet Take 0.25 mg by mouth daily.     Marland Kitchen rOPINIRole (REQUIP) 1 MG tablet Take 3 tablets (3 mg total) by mouth at bedtime. (Patient taking differently: Take 3 mg by mouth at bedtime. ) 90 tablet 2  . rosuvastatin (CRESTOR) 10 MG tablet Take 10 mg by mouth daily.    Marland Kitchen desonide (DESOWEN) 0.05 % ointment   0  . econazole nitrate 1 % cream Apply topically daily.    Marland Kitchen lithium carbonate 600 MG  capsule Take 1 capsule (600 mg total) by mouth every morning. tk 3 (Patient not taking: Reported on 11/22/2017) 90 capsule 1  . mupirocin ointment (BACTROBAN) 2 %   1  . pramipexole (MIRAPEX) 0.5 MG tablet TAKE ONE AND A HALF TABLET IN THE MORNING & 3 TABLETS IN THE EVENING (Patient not taking: Reported on 11/22/2017) 135 tablet 1   No current facility-administered medications for this visit.     Medication Side Effects: Other: sexual  Allergies:  Allergies  Allergen Reactions  . Erythromycin Hives    Past Medical History:  Diagnosis Date  . Allergy   . Anemia    iron  . Carotid artery occlusion   . Hyperlipidemia   . Obesity   . OCD (obsessive  compulsive disorder)   . Periodic limb movement disorder   . Sleep apnea     Family History  Problem Relation Age of Onset  . Cancer Mother        breast and ovarian  . Depression Father        bi-polar    Social History   Socioeconomic History  . Marital status: Married    Spouse name: Not on file  . Number of children: Not on file  . Years of education: Not on file  . Highest education level: Not on file  Occupational History  . Not on file  Social Needs  . Financial resource strain: Not on file  . Food insecurity:    Worry: Not on file    Inability: Not on file  . Transportation needs:    Medical: Not on file    Non-medical: Not on file  Tobacco Use  . Smoking status: Never Smoker  . Smokeless tobacco: Never Used  Substance and Sexual Activity  . Alcohol use: No  . Drug use: No  . Sexual activity: Not on file  Lifestyle  . Physical activity:    Days per week: Not on file    Minutes per session: Not on file  . Stress: Not on file  Relationships  . Social connections:    Talks on phone: Not on file    Gets together: Not on file    Attends religious service: Not on file    Active member of club or organization: Not on file    Attends meetings of clubs or organizations: Not on file    Relationship status: Not on file  . Intimate partner violence:    Fear of current or ex partner: Not on file    Emotionally abused: Not on file    Physically abused: Not on file    Forced sexual activity: Not on file  Other Topics Concern  . Not on file  Social History Narrative  . Not on file    Past Medical History, Surgical history, Social history, and Family history were reviewed and updated as appropriate.   F had tremor.  PE soon Dec 12.    Please see review of systems for further details on the patient's review from today.   Objective:   Physical Exam:  There were no vitals taken for this visit.  Physical Exam  Constitutional: He is oriented to person,  place, and time. He appears well-developed. No distress.  Musculoskeletal: He exhibits no deformity.  Neurological: He is alert and oriented to person, place, and time. He displays no tremor. Coordination and gait normal.  Psychiatric: His speech is normal and behavior is normal. Judgment and thought content normal. His mood appears  anxious. His affect is not angry, not blunt, not labile and not inappropriate. Cognition and memory are normal. He exhibits a depressed mood. He expresses no homicidal and no suicidal ideation. He expresses no suicidal plans and no homicidal plans.  Insight intact. No auditory or visual hallucinations. No delusions.  He is attentive.    Lab Review:  No results found for: NA, K, CL, CO2, GLUCOSE, BUN, CREATININE, CALCIUM, PROT, ALBUMIN, AST, ALT, ALKPHOS, BILITOT, GFRNONAA, GFRAA  No results found for: WBC, RBC, HGB, HCT, PLT, MCV, MCH, MCHC, RDW, LYMPHSABS, MONOABS, EOSABS, BASOSABS  No results found for: POCLITH, LITHIUM   No results found for: PHENYTOIN, PHENOBARB, VALPROATE, CBMZ   .res Assessment: Plan:    Mixed obsessional thoughts and acts  Major depressive disorder, recurrent episode, moderate (HCC)  Erectile disorder, acquired, generalized, moderate  Low vitamin D level  Restless legs syndrome  Obstructive sleep apnea  Please see After Visit Summary for patient specific instructions.  Option B6 500 mg BID for tremor which predates the lithium.  Likely has essential tremor bc F also had the tremor.  Discussed possible med causes  And non-medical causes of ED, including alcohol.  Also discussed possibility of using ED meds.  He prefers to avoid those if possible.  He wants to sort out if it's related to CVD.  He suspects the modafinil as a cause.  Discussed usual dosing ranges of it vs. Armodafinil.  Answered questions about mindfulness exercises.    Per his request drop lithium back to 150 to see if sex function is better.  Disc risk of  increased depression bc he said before this helped mood when he increased to 450mg  daily.  Get labs D, testosterone. Lithium not needed at so low of a dosage.  Consider TSH.  He plans this.  He asks about possibly increasing ropinirole for off label benefit for mood.  I advised against that to minimize number of medications to minimize risk of sexual dysfunction from that cause as well.  This appointment was 45 minutes  FU q 3weeks.  Meredith Staggers MD [facility]  Future Appointments  Date Time Provider Department Center  12/17/2017 11:30 AM Cottle, Steva Ready., MD [facility] None  01/08/2018 10:30 AM Cottle, Steva Ready., MD [facility] None    No orders of the defined types were placed in this encounter.     -------------------------------

## 2017-11-22 NOTE — Patient Instructions (Signed)
B6 500mg  twice daily may help tremor.

## 2017-11-24 DIAGNOSIS — G4733 Obstructive sleep apnea (adult) (pediatric): Secondary | ICD-10-CM | POA: Diagnosis not present

## 2017-12-08 DIAGNOSIS — J069 Acute upper respiratory infection, unspecified: Secondary | ICD-10-CM | POA: Diagnosis not present

## 2017-12-09 DIAGNOSIS — L814 Other melanin hyperpigmentation: Secondary | ICD-10-CM | POA: Diagnosis not present

## 2017-12-09 DIAGNOSIS — Q386 Other congenital malformations of mouth: Secondary | ICD-10-CM | POA: Diagnosis not present

## 2017-12-09 DIAGNOSIS — L821 Other seborrheic keratosis: Secondary | ICD-10-CM | POA: Diagnosis not present

## 2017-12-09 DIAGNOSIS — D225 Melanocytic nevi of trunk: Secondary | ICD-10-CM | POA: Diagnosis not present

## 2017-12-16 ENCOUNTER — Encounter: Payer: Self-pay | Admitting: Psychiatry

## 2017-12-16 ENCOUNTER — Ambulatory Visit: Payer: No Typology Code available for payment source | Admitting: Psychiatry

## 2017-12-16 DIAGNOSIS — R7989 Other specified abnormal findings of blood chemistry: Secondary | ICD-10-CM | POA: Diagnosis not present

## 2017-12-16 DIAGNOSIS — F331 Major depressive disorder, recurrent, moderate: Secondary | ICD-10-CM | POA: Diagnosis not present

## 2017-12-16 DIAGNOSIS — F422 Mixed obsessional thoughts and acts: Secondary | ICD-10-CM

## 2017-12-16 DIAGNOSIS — G2581 Restless legs syndrome: Secondary | ICD-10-CM

## 2017-12-16 NOTE — Addendum Note (Signed)
Addended by: Kirstie PeriOTTLE, CAREY G on: 12/16/2017 05:28 PM   Modules accepted: Orders

## 2017-12-16 NOTE — Progress Notes (Signed)
Eric ConnersMichael R [name] [id number] December 05, 1950 67 y.o.  Subjective:   Patient ID:  Eric [name] is a 67 y.o. (DOB December 05, 1950) male.  Chief Complaint:  Chief Complaint  Patient [name] with  . Depression  . Anxiety  . Sleep Apnea  . Medication Problem    changes made at last visit    HPI Eric [name] to the office today for follow-up of OCD and depression and med changes. Change back to ropinirole from pramipexole resolved the sexual SE and still mangaged the RLS without worsening depression.  Still sig OCD with anxiety including affecting sleep at times.  Overall the OCD is not much worse than it was at 20mg  daily but he's not sure.  Also unsure about the sexual SE difference.  Varies from 7-12 hours per BiPAP.  Good response to the Tx.  Anxiety over the kids.  Son is making progress.  Still not enough energy.  Prior psychiatric medication trials include Lexapro, citalopram, clomipramine, paroxetine, fluoxetine, Luvox, Abilify, bupropion, Cerefolin NAC, and modafinil and Nuvigil.   Review of Systems:  Review of Systems  Musculoskeletal: Positive for arthralgias and myalgias.  Psychiatric/Behavioral: Positive for dysphoric mood. Negative for agitation, behavioral problems, confusion, decreased concentration, hallucinations, self-injury, sleep disturbance and suicidal ideas. The patient is nervous/anxious. The patient is not hyperactive.     Medications: I have reviewed the patient's current medications.  Current Outpatient Medications  Medication Sig Dispense Refill  . ALPRAZolam (XANAX) 0.25 MG tablet Take 0.25 mg by mouth 2 (two) times daily as needed for anxiety or sleep.    Marland Kitchen. aspirin 81 MG tablet Take 81 mg by mouth daily.    Marland Kitchen. buPROPion (WELLBUTRIN XL) 150 MG 24 hr tablet Take 450 mg by mouth daily.     . Cholecalciferol (VITAMIN D-3) 5000 units TABS Take 5,000 Units by mouth daily.    Marland Kitchen. co-enzyme Q-10 30 MG capsule Take 30 mg by mouth daily.    .  CRESTOR 5 MG tablet Take 5 mg by mouth daily.  2  . desonide (DESOWEN) 0.05 % ointment   0  . econazole nitrate 1 % cream Apply topically daily.    Marland Kitchen. escitalopram (LEXAPRO) 20 MG tablet Take 10 mg by mouth daily.     Marland Kitchen. lithium carbonate 150 MG capsule Take 150 mg by mouth at bedtime.    . modafinil (PROVIGIL) 200 MG tablet Take 1 tablet (200 mg total) by mouth 2 (two) times daily. 1 qam and 1 noon (Patient taking differently: Take 100 mg by mouth daily. ) 60 tablet 5  . mupirocin ointment (BACTROBAN) 2 %   1  . risperiDONE (RISPERDAL) 0.25 MG tablet Take 0.25 mg by mouth daily as needed (tends to brighten mood when needed).     Marland Kitchen. rOPINIRole (REQUIP) 1 MG tablet Take 3 tablets (3 mg total) by mouth at bedtime. (Patient taking differently: Take 2-3 mg by mouth at bedtime. ) 90 tablet 2  . LACTOBACILLUS PO Take by mouth.     No current facility-administered medications for this visit.     Medication Side Effects: None  Allergies:  Allergies  Allergen Reactions  . Erythromycin Hives    Past Medical History:  Diagnosis Date  . Allergy   . Anemia    iron  . Carotid artery occlusion   . Hyperlipidemia   . Obesity   . OCD (obsessive compulsive disorder)   . Periodic limb movement disorder   . Sleep apnea     Family  History  Problem Relation Age of Onset  . Cancer Mother        breast and ovarian  . Depression Father        bi-polar    Social History   Socioeconomic History  . Marital status: Married    Spouse name: Not on file  . Number of children: Not on file  . Years of education: Not on file  . Highest education level: Not on file  Occupational History  . Not on file  Social Needs  . Financial resource strain: Not on file  . Food insecurity:    Worry: Not on file    Inability: Not on file  . Transportation needs:    Medical: Not on file    Non-medical: Not on file  Tobacco Use  . Smoking status: Never Smoker  . Smokeless tobacco: Never Used  Substance and  Sexual Activity  . Alcohol use: No  . Drug use: No  . Sexual activity: Not on file  Lifestyle  . Physical activity:    Days per week: Not on file    Minutes per session: Not on file  . Stress: Not on file  Relationships  . Social connections:    Talks on phone: Not on file    Gets together: Not on file    Attends religious service: Not on file    Active member of club or organization: Not on file    Attends meetings of clubs or organizations: Not on file    Relationship status: Not on file  . Intimate partner violence:    Fear of current or ex partner: Not on file    Emotionally abused: Not on file    Physically abused: Not on file    Forced sexual activity: Not on file  Other Topics Concern  . Not on file  Social History Narrative  . Not on file    Past Medical History, Surgical history, Social history, and Family history were reviewed and updated as appropriate.   Please see review of systems for further details on the patient's review from today.   Objective:   Physical Exam:  There were no vitals taken for this visit.  Physical Exam  Neurological: He displays no tremor. Gait normal.  Fidgety per usual.  Psychiatric: His behavior is normal. Judgment normal. His mood appears anxious. His affect is not angry. His speech is not rapid and/or pressured and not slurred. Thought content is not paranoid. Cognition and memory are normal. He exhibits a depressed mood. He expresses no homicidal and no suicidal ideation.  Insight and judgment good. Obsessions with anxiety continue. He is attentive.    Lab Review:  No results found for: NA, K, CL, CO2, GLUCOSE, BUN, CREATININE, CALCIUM, PROT, ALBUMIN, AST, ALT, ALKPHOS, BILITOT, GFRNONAA, GFRAA  No results found for: WBC, RBC, HGB, HCT, PLT, MCV, MCH, MCHC, RDW, LYMPHSABS, MONOABS, EOSABS, BASOSABS  No results found for: POCLITH, LITHIUM   No results found for: PHENYTOIN, PHENOBARB, VALPROATE, CBMZ   .res Assessment:  Plan:    Mixed obsessional thoughts and acts  Major depressive disorder, recurrent episode, moderate (HCC)  Restless legs syndrome  Low vitamin D level  Eric [name] has a long history of depression and OCD which are partially controlled.  He believes the depression is a consequence of the residual OCD.  He has some compulsive checking and obsessions around the house maintenance.  He wishes to avoid sexual side effects and so we are keeping the SSRI  at the lowest possible dose.  He is tried all of the reasonable SSRI options with the exception possibly of sertraline but it is likely to have more sexual dysfunction than what he is taking now.  When travels then tends to have less OCD bc triggered less.  Will visit D in [place] for 10 days.  No problem with the switch back to ropinirole.  Counseled patient regarding potential benefits, risks, and side effects of lithium to include potential risk of lithium affecting thyroid and renal function.  Discussed need for periodic lab monitoring to determine drug level and to assess for potential adverse effects.  Counseled patient regarding signs and symptoms of lithium toxicity and advised that they notify office immediately or seek urgent medical attention if experiencing these signs and symptoms.  Patient advised to contact office with any questions or concerns.  Will check BMP and TSH and vitamin D.  Discussed potential metabolic side effects associated with atypical antipsychotics, as well as potential risk for movement side effects. Advised pt to contact office if movement side effects occur.   We discussed the role of the Wellbutrin at helping his mood and his energy level.  Follow-up 3 weeks  This appointment was 45 minutes  Meredith Staggers MD, DFAPA. Please see After Visit Summary for patient specific instructions.  Future Appointments  Date Time Provider Department Center  01/21/2018 11:30 AM Cottle, Steva Ready., MD [facility] None   02/05/2018 11:30 AM Cottle, Steva Ready., MD [facility] None    No orders of the defined types were placed in this encounter.     -------------------------------

## 2017-12-17 ENCOUNTER — Ambulatory Visit: Payer: No Typology Code available for payment source | Admitting: Psychiatry

## 2017-12-23 ENCOUNTER — Other Ambulatory Visit: Payer: Self-pay

## 2017-12-23 MED ORDER — ESCITALOPRAM OXALATE 20 MG PO TABS: 20.0000 mg | ORAL_TABLET | Freq: Every day | ORAL | 0 refills | Status: DC

## 2017-12-24 DIAGNOSIS — G4733 Obstructive sleep apnea (adult) (pediatric): Secondary | ICD-10-CM | POA: Diagnosis not present

## 2017-12-31 DIAGNOSIS — Z1159 Encounter for screening for other viral diseases: Secondary | ICD-10-CM | POA: Diagnosis not present

## 2017-12-31 DIAGNOSIS — Z125 Encounter for screening for malignant neoplasm of prostate: Secondary | ICD-10-CM | POA: Diagnosis not present

## 2017-12-31 DIAGNOSIS — E7800 Pure hypercholesterolemia, unspecified: Secondary | ICD-10-CM | POA: Diagnosis not present

## 2017-12-31 DIAGNOSIS — F331 Major depressive disorder, recurrent, moderate: Secondary | ICD-10-CM | POA: Diagnosis not present

## 2017-12-31 DIAGNOSIS — R7303 Prediabetes: Secondary | ICD-10-CM | POA: Diagnosis not present

## 2017-12-31 DIAGNOSIS — E559 Vitamin D deficiency, unspecified: Secondary | ICD-10-CM | POA: Diagnosis not present

## 2017-12-31 DIAGNOSIS — R5383 Other fatigue: Secondary | ICD-10-CM | POA: Diagnosis not present

## 2018-01-02 DIAGNOSIS — E7800 Pure hypercholesterolemia, unspecified: Secondary | ICD-10-CM | POA: Diagnosis not present

## 2018-01-02 DIAGNOSIS — I6529 Occlusion and stenosis of unspecified carotid artery: Secondary | ICD-10-CM | POA: Diagnosis not present

## 2018-01-02 DIAGNOSIS — Z0000 Encounter for general adult medical examination without abnormal findings: Secondary | ICD-10-CM | POA: Diagnosis not present

## 2018-01-02 DIAGNOSIS — R7303 Prediabetes: Secondary | ICD-10-CM | POA: Diagnosis not present

## 2018-01-02 DIAGNOSIS — Z23 Encounter for immunization: Secondary | ICD-10-CM | POA: Diagnosis not present

## 2018-01-06 ENCOUNTER — Ambulatory Visit: Payer: No Typology Code available for payment source | Admitting: Psychiatry

## 2018-01-08 ENCOUNTER — Ambulatory Visit: Payer: No Typology Code available for payment source | Admitting: Psychiatry

## 2018-01-21 ENCOUNTER — Encounter

## 2018-01-21 ENCOUNTER — Encounter: Payer: Self-pay | Admitting: Psychiatry

## 2018-01-21 ENCOUNTER — Ambulatory Visit: Payer: No Typology Code available for payment source | Admitting: Psychiatry

## 2018-01-21 VITALS — BP 121/61 | HR 64

## 2018-01-21 DIAGNOSIS — F422 Mixed obsessional thoughts and acts: Secondary | ICD-10-CM

## 2018-01-21 DIAGNOSIS — F331 Major depressive disorder, recurrent, moderate: Secondary | ICD-10-CM

## 2018-01-21 DIAGNOSIS — G4733 Obstructive sleep apnea (adult) (pediatric): Secondary | ICD-10-CM

## 2018-01-21 DIAGNOSIS — R7989 Other specified abnormal findings of blood chemistry: Secondary | ICD-10-CM

## 2018-01-21 DIAGNOSIS — G2581 Restless legs syndrome: Secondary | ICD-10-CM | POA: Diagnosis not present

## 2018-01-21 DIAGNOSIS — F5221 Male erectile disorder: Secondary | ICD-10-CM

## 2018-01-21 NOTE — Progress Notes (Signed)
Eric [name] [id number] November 19, 1950 67 y.o.  Subjective:   Patient ID:  Eric [name] is a 67 y.o. (DOB March 07, 1950) male.  Chief Complaint:  Chief Complaint  Patient presents with  . Follow-up    Medication Management  . Anxiety    OCD  . Depression    HPI Eric [name] presents to the office today for follow-up of OCD and depression and med changes. Change back to ropinirole from pramipexole resolved the sexual SE and still mangaged the RLS without worsening depression.  Still sig OCD with anxiety including affecting sleep at times.  Overall the OCD is not much worse than it was at 20mg  daily but he's not sure.  Also unsure about the sexual SE difference.  Varies from 7-12 hours per BiPAP.  Good response to the Tx.  Anxiety over the kids.  Son is making progress.  Sexual functioning is better with less Lexapro.  OCD is not markedly worse at this time.  Asked questions about metformin and it's affects.  Also discussed stressor of renewed relationship with mentally and physicallly ill friend.  Still not enough energy.Mood a little better but still afraid to start projects out of fear that he'll make mistakes.  Prior psychiatric medication trials include Lexapro, citalopram, clomipramine, paroxetine, fluoxetine, Luvox, Abilify, bupropion, Cerefolin NAC, and modafinil and Nuvigil, pramipexole.   Review of Systems:  Review of Systems  Musculoskeletal: Positive for arthralgias and myalgias.  Psychiatric/Behavioral: Positive for dysphoric mood. Negative for agitation, behavioral problems, confusion, decreased concentration, hallucinations, self-injury, sleep disturbance and suicidal ideas. The patient is nervous/anxious. The patient is not hyperactive.     Medications: I have reviewed the patient's current medications.  Current Outpatient Medications  Medication Sig Dispense Refill  . ALPRAZolam (XANAX) 0.25 MG tablet Take 0.25 mg by mouth 2 (two) times daily  as needed for anxiety or sleep.    Marland Kitchen aspirin 81 MG tablet Take 81 mg by mouth daily.    Marland Kitchen buPROPion (WELLBUTRIN XL) 150 MG 24 hr tablet Take 450 mg by mouth daily.     . Cholecalciferol (VITAMIN D-3) 5000 units TABS Take 5,000 Units by mouth daily.    . Coenzyme Q10 200 MG TABS Take 300 mg by mouth daily.     . CRESTOR 5 MG tablet Take 5 mg by mouth daily.  2  . desonide (DESOWEN) 0.05 % ointment   0  . econazole nitrate 1 % cream Apply topically daily.    Marland Kitchen escitalopram (LEXAPRO) 20 MG tablet Take 1 tablet (20 mg total) by mouth daily. (Patient taking differently: Take 10 mg by mouth daily. ) 30 tablet 0  . lithium carbonate 150 MG capsule Take 150 mg by mouth at bedtime.    . modafinil (PROVIGIL) 100 MG tablet Take 100 mg by mouth daily.    . mupirocin ointment (BACTROBAN) 2 %   1  . risperiDONE (RISPERDAL) 0.25 MG tablet Take 0.25 mg by mouth daily as needed (tends to brighten mood when needed).     Marland Kitchen rOPINIRole (REQUIP) 1 MG tablet Take 2 mg by mouth at bedtime.     Marland Kitchen LACTOBACILLUS PO Take by mouth.     No current facility-administered medications for this visit.     Medication Side Effects: None sexual SE are better not  All gone.  Allergies:  Allergies  Allergen Reactions  . Erythromycin Hives    Past Medical History:  Diagnosis Date  . Allergy   . Anemia    iron  .  Carotid artery occlusion   . Hyperlipidemia   . Obesity   . OCD (obsessive compulsive disorder)   . Periodic limb movement disorder   . Sleep apnea     Family History  Problem Relation Age of Onset  . Cancer Mother        breast and ovarian  . Depression Father        bi-polar    Social History   Socioeconomic History  . Marital status: Married    Spouse name: Not on file  . Number of children: Not on file  . Years of education: Not on file  . Highest education level: Not on file  Occupational History  . Not on file  Social Needs  . Financial resource strain: Not on file  . Food  insecurity:    Worry: Not on file    Inability: Not on file  . Transportation needs:    Medical: Not on file    Non-medical: Not on file  Tobacco Use  . Smoking status: Never Smoker  . Smokeless tobacco: Never Used  Substance and Sexual Activity  . Alcohol use: No  . Drug use: No  . Sexual activity: Not on file  Lifestyle  . Physical activity:    Days per week: Not on file    Minutes per session: Not on file  . Stress: Not on file  Relationships  . Social connections:    Talks on phone: Not on file    Gets together: Not on file    Attends religious service: Not on file    Active member of club or organization: Not on file    Attends meetings of clubs or organizations: Not on file    Relationship status: Not on file  . Intimate partner violence:    Fear of current or ex partner: Not on file    Emotionally abused: Not on file    Physically abused: Not on file    Forced sexual activity: Not on file  Other Topics Concern  . Not on file  Social History Narrative  . Not on file    Past Medical History, Surgical history, Social history, and Family history were reviewed and updated as appropriate.   Please see review of systems for further details on the patient's review from today.   Objective:   Physical Exam:  BP 121/61   Pulse 64   Physical Exam Neurological:     Motor: No tremor.     Gait: Gait normal.     Comments: Fidgety per usual.  Psychiatric:        Attention and Perception: He is attentive.        Mood and Affect: Mood is anxious and depressed. Affect is not angry.        Speech: Speech is not rapid and pressured or slurred.        Behavior: Behavior normal.        Thought Content: Thought content is not paranoid. Thought content does not include homicidal or suicidal ideation.        Judgment: Judgment normal.     Comments: Insight and judgment good. Obsessions with anxiety continue.     Lab Review:  No results found for: NA, K, CL, CO2, GLUCOSE,  BUN, CREATININE, CALCIUM, PROT, ALBUMIN, AST, ALT, ALKPHOS, BILITOT, GFRNONAA, GFRAA  No results found for: WBC, RBC, HGB, HCT, PLT, MCV, MCH, MCHC, RDW, LYMPHSABS, MONOABS, EOSABS, BASOSABS  No results found for: POCLITH, LITHIUM   No results  found for: PHENYTOIN, PHENOBARB, VALPROATE, CBMZ   .res Assessment: Plan:    Mixed obsessional thoughts and acts  Major depressive disorder, recurrent episode, moderate (HCC)  Restless legs syndrome  Low vitamin D level  Obstructive sleep apnea  Erectile disorder, acquired, generalized, moderate  Eric [name] has a long history of depression and OCD which are partially controlled.  He believes the depression is a consequence of the residual OCD.  He has some compulsive checking and obsessions around the house maintenance.  He wishes to avoid sexual side effects and so we are keeping the SSRI at the lowest possible dose.  He is tried all of the reasonable SSRI options with the exception possibly of sertraline but it is likely to have more sexual dysfunction than what he is taking now.  When travels then tends to have less OCD bc triggered less.    No problem with the switch back to ropinirole. RLS managed.  Counseled patient regarding potential benefits, risks, and side effects of lithium to include potential risk of lithium affecting thyroid and renal function.  Discussed need for periodic lab monitoring to determine drug level and to assess for potential adverse effects.  Counseled patient regarding signs and symptoms of lithium toxicity and advised that they notify office immediately or seek urgent medical attention if experiencing these signs and symptoms.  Patient advised to contact office with any questions or concerns.  Vitamin D level acceptable at 54.5.  Disc value of Vitamin D.  Disc boundary setting with a person she's dealing with.   Answered questions around the use of metformin and it's potential protection of telomere  length.  Discussed potential metabolic side effects associated with atypical antipsychotics, as well as potential risk for movement side effects. Advised pt to contact office if movement side effects occur.   We discussed the role of the Wellbutrin at helping his mood and his energy level.  He's using Nuvigil or Provigil on prn basis for excessive sleepiness related to obstructive sleep apnea  Follow-up 3 weeks  This appointment was 40 minutes  Meredith Staggersarey Cottle MD, DFAPA. Please see After Visit Summary for patient specific instructions.  Future Appointments  Date Time Provider Department Center  02/05/2018 11:30 AM Cottle, Steva Readyarey G Jr., MD [facility] None  02/28/2018  1:00 PM Cottle, Steva Readyarey G Jr., MD [facility] None  03/21/2018 11:15 AM Cottle, Steva Readyarey G Jr., MD [facility] None    No orders of the defined types were placed in this encounter.     -------------------------------

## 2018-01-24 DIAGNOSIS — G4733 Obstructive sleep apnea (adult) (pediatric): Secondary | ICD-10-CM | POA: Diagnosis not present

## 2018-01-26 ENCOUNTER — Other Ambulatory Visit: Payer: Self-pay | Admitting: Psychiatry

## 2018-01-29 ENCOUNTER — Ambulatory Visit: Payer: No Typology Code available for payment source | Admitting: Psychiatry

## 2018-01-30 ENCOUNTER — Other Ambulatory Visit: Payer: Self-pay

## 2018-01-30 MED ORDER — BUPROPION HCL ER (XL) 150 MG PO TB24: 450.0000 mg | ORAL_TABLET | Freq: Every day | ORAL | 0 refills | Status: DC

## 2018-02-05 ENCOUNTER — Encounter: Payer: Self-pay | Admitting: Psychiatry

## 2018-02-05 ENCOUNTER — Ambulatory Visit: Payer: No Typology Code available for payment source | Admitting: Psychiatry

## 2018-02-05 DIAGNOSIS — F422 Mixed obsessional thoughts and acts: Secondary | ICD-10-CM | POA: Diagnosis not present

## 2018-02-05 DIAGNOSIS — F5221 Male erectile disorder: Secondary | ICD-10-CM | POA: Diagnosis not present

## 2018-02-05 DIAGNOSIS — G2581 Restless legs syndrome: Secondary | ICD-10-CM

## 2018-02-05 DIAGNOSIS — F331 Major depressive disorder, recurrent, moderate: Secondary | ICD-10-CM | POA: Diagnosis not present

## 2018-02-05 DIAGNOSIS — G4733 Obstructive sleep apnea (adult) (pediatric): Secondary | ICD-10-CM

## 2018-02-05 DIAGNOSIS — R7989 Other specified abnormal findings of blood chemistry: Secondary | ICD-10-CM

## 2018-02-05 NOTE — Progress Notes (Signed)
[name] [name] [id number] 16-Jul-1950 68 y.o.  Subjective:   Patient ID:  Eric BisMichael R Declercq is a 68 y.o. (DOB 16-Jul-1950) male.  Chief Complaint:  Chief Complaint  Patient presents with  . Follow-up    Medication Management  . Anxiety    HPI  Last seen 01/21/18 [name] [name] presents to the office today for follow-up of OCD and depression and med changes. CO more anxiety.  Asks if son can switch providers.  He's also seeing Eric CarawayJane [name], counseling.  Has to change PCP due to change in health insurance and very upset over this.  Started having panic attacks over money issues that don't all relate to him.  Excessive worry.  Some triggers or increased anxiety.  "Things are going crazy."  More obsessions.  Sleep fine once asleep.  Struggles to remember Wellbutrin.  Gets it maybe 50% time.  Change back to ropinirole from pramipexole resolved the sexual SE and still mangaged the RLS without worsening depression.  Still sig OCD with anxiety including affecting sleep at times.  Sexual functioning is better with less Lexapro.    Also discussed stressor of renewed relationship with mentally and physicallly ill friend.  Still not enough energy.Mood a little better but still afraid to start projects out of fear that he'll make mistakes.  Prior psychiatric medication trials include Lexapro, citalopram, clomipramine, paroxetine, fluoxetine, Luvox, Abilify, bupropion, Cerefolin NAC, and modafinil and Nuvigil, pramipexole.   Review of Systems:  Review of Systems  Musculoskeletal: Positive for arthralgias and myalgias.  Neurological: Negative for tremors and weakness.  Psychiatric/Behavioral: Positive for dysphoric mood. Negative for agitation, behavioral problems, confusion, decreased concentration, hallucinations, self-injury, sleep disturbance and suicidal ideas. The patient is nervous/anxious. The patient is not hyperactive.     Medications: I have reviewed the  patient's current medications.  Current Outpatient Medications  Medication Sig Dispense Refill  . ALPRAZolam (XANAX) 0.25 MG tablet Take 0.25 mg by mouth 2 (two) times daily as needed for anxiety or sleep.    Marland Kitchen. aspirin 81 MG tablet Take 81 mg by mouth daily.    Marland Kitchen. buPROPion (WELLBUTRIN XL) 150 MG 24 hr tablet Take 3 tablets (450 mg total) by mouth daily. 270 tablet 0  . Cholecalciferol (VITAMIN D-3) 5000 units TABS Take 5,000 Units by mouth daily.    . Coenzyme Q10 200 MG TABS Take 300 mg by mouth daily.     . CRESTOR 5 MG tablet Take 5 mg by mouth daily.  2  . econazole nitrate 1 % cream Apply topically daily.    Marland Kitchen. escitalopram (LEXAPRO) 20 MG tablet Take 0.5 tablets (10 mg total) by mouth daily. 30 tablet 0  . lithium carbonate 150 MG capsule Take 150 mg by mouth at bedtime.    . modafinil (PROVIGIL) 100 MG tablet Take 100 mg by mouth daily.    . mupirocin ointment (BACTROBAN) 2 %   1  . risperiDONE (RISPERDAL) 0.25 MG tablet Take 0.25 mg by mouth daily as needed (tends to brighten mood when needed).     Marland Kitchen. rOPINIRole (REQUIP) 1 MG tablet Take 2 mg by mouth at bedtime.     Marland Kitchen. desonide (DESOWEN) 0.05 % ointment   0  . LACTOBACILLUS PO Take by mouth.     No current facility-administered medications for this visit.     Medication Side Effects: None sexual SE are better not  All gone.  Allergies:  Allergies  Allergen Reactions  . Erythromycin Hives    Past Medical History:  Diagnosis Date  . Allergy   . Anemia    iron  . Carotid artery occlusion   . Hyperlipidemia   . Obesity   . OCD (obsessive compulsive disorder)   . Periodic limb movement disorder   . Sleep apnea     Family History  Problem Relation Age of Onset  . Cancer Mother        breast and ovarian  . Depression Father        bi-polar    Social History   Socioeconomic History  . Marital status: Married    Spouse name: Not on file  . Number of children: Not on file  . Years of education: Not on file  .  Highest education level: Not on file  Occupational History  . Not on file  Social Needs  . Financial resource strain: Not on file  . Food insecurity:    Worry: Not on file    Inability: Not on file  . Transportation needs:    Medical: Not on file    Non-medical: Not on file  Tobacco Use  . Smoking status: Never Smoker  . Smokeless tobacco: Never Used  Substance and Sexual Activity  . Alcohol use: No  . Drug use: No  . Sexual activity: Not on file  Lifestyle  . Physical activity:    Days per week: Not on file    Minutes per session: Not on file  . Stress: Not on file  Relationships  . Social connections:    Talks on phone: Not on file    Gets together: Not on file    Attends religious service: Not on file    Active member of club or organization: Not on file    Attends meetings of clubs or organizations: Not on file    Relationship status: Not on file  . Intimate partner violence:    Fear of current or ex partner: Not on file    Emotionally abused: Not on file    Physically abused: Not on file    Forced sexual activity: Not on file  Other Topics Concern  . Not on file  Social History Narrative  . Not on file    Past Medical History, Surgical history, Social history, and Family history were reviewed and updated as appropriate.   Please see review of systems for further details on the patient's review from today.   Objective:   Physical Exam:  There were no vitals taken for this visit.  Physical Exam Neurological:     Motor: No tremor.     Gait: Gait normal.     Comments: Fidgety per usual.  Psychiatric:        Attention and Perception: He is attentive.        Mood and Affect: Mood is anxious and depressed. Affect is not angry.        Speech: Speech is not rapid and pressured or slurred.        Behavior: Behavior normal.        Thought Content: Thought content is not paranoid. Thought content does not include homicidal or suicidal ideation.        Judgment:  Judgment normal.     Comments: Insight and judgment good. Obsessions with anxiety continue.     Lab Review:  No results found for: NA, K, CL, CO2, GLUCOSE, BUN, CREATININE, CALCIUM, PROT, ALBUMIN, AST, ALT, ALKPHOS, BILITOT, GFRNONAA, GFRAA  No results found for: WBC, RBC, HGB, HCT, PLT, MCV, MCH, MCHC,  RDW, LYMPHSABS, MONOABS, EOSABS, BASOSABS  No results found for: POCLITH, LITHIUM   No results found for: PHENYTOIN, PHENOBARB, VALPROATE, CBMZ   .res Assessment: Plan:    Mixed obsessional thoughts and acts  Major depressive disorder, recurrent episode, moderate (HCC)  Restless legs syndrome  Erectile disorder, acquired, generalized, moderate  Low vitamin D level  Obstructive sleep apnea  Mr. Pegan has a long history of depression and OCD which are partially controlled.  He believes the depression is a consequence of the residual OCD.  He has some compulsive checking and obsessions around the house maintenance.  He wishes to avoid sexual side effects and so we are keeping the SSRI at the lowest possible dose.  He is tried all of the reasonable SSRI options with the exception possibly of sertraline but it is likely to have more sexual dysfunction than what he is taking now.  When travels then tends to have less OCD bc triggered less.  However the anxiety is worse since his last visit.  We discussed at length the gradual increased benefit when SSRIs are added and the very slow sometimes loss of benefit when they are withdrawn.  This could be a factor.  Agreed increase in the Lexapro back to 20 mg daily.  Dose cut  To 10 in March 2019.  No problem with the switch back to ropinirole. RLS managed.  Hold new supplement for a week to see if it's causing anxiety(Dr. Gundry's Restore).  Ingredients reviewed with him.  Some of these are unknown to me.  Counseled patient regarding potential benefits, risks, and side effects of lithium to include potential risk of lithium affecting  thyroid and renal function.  Discussed need for periodic lab monitoring to determine drug level and to assess for potential adverse effects.  Counseled patient regarding signs and symptoms of lithium toxicity and advised that they notify office immediately or seek urgent medical attention if experiencing these signs and symptoms.  Patient advised to contact office with any questions or concerns.  Vitamin D level acceptable at 54.5.  Disc value of Vitamin D.  Disc dealing with recent insurance problems, health insurance problems  Discussed potential metabolic side effects associated with atypical antipsychotics, as well as potential risk for movement side effects. Advised pt to contact office if movement side effects occur.   We discussed the role of the Wellbutrin at helping his mood and his energy level.  He's using Nuvigil or Provigil on prn basis for excessive sleepiness related to obstructive sleep apnea  Follow-up 3 weeks  This appointment was 40 minutes  Meredith Staggers MD, DFAPA. Please see After Visit Summary for patient specific instructions.  Future Appointments  Date Time Provider Department Center  02/28/2018  1:00 PM Cottle, Steva Ready., MD [facility] None  03/21/2018 11:15 AM Cottle, Steva Ready., MD [facility] None  04/09/2018  1:30 PM Cottle, Steva Ready., MD [facility] None    No orders of the defined types were placed in this encounter.     -------------------------------

## 2018-02-08 DIAGNOSIS — Z0000 Encounter for general adult medical examination without abnormal findings: Secondary | ICD-10-CM | POA: Diagnosis not present

## 2018-02-24 DIAGNOSIS — G4733 Obstructive sleep apnea (adult) (pediatric): Secondary | ICD-10-CM | POA: Diagnosis not present

## 2018-02-28 ENCOUNTER — Encounter: Payer: Self-pay | Admitting: Psychiatry

## 2018-02-28 ENCOUNTER — Ambulatory Visit: Payer: No Typology Code available for payment source | Admitting: Psychiatry

## 2018-02-28 VITALS — BP 122/71 | HR 61

## 2018-02-28 DIAGNOSIS — R7989 Other specified abnormal findings of blood chemistry: Secondary | ICD-10-CM

## 2018-02-28 DIAGNOSIS — F5221 Male erectile disorder: Secondary | ICD-10-CM

## 2018-02-28 DIAGNOSIS — F422 Mixed obsessional thoughts and acts: Secondary | ICD-10-CM

## 2018-02-28 DIAGNOSIS — F331 Major depressive disorder, recurrent, moderate: Secondary | ICD-10-CM | POA: Diagnosis not present

## 2018-02-28 DIAGNOSIS — G2581 Restless legs syndrome: Secondary | ICD-10-CM | POA: Diagnosis not present

## 2018-02-28 NOTE — Progress Notes (Signed)
Eric [name] [id number] [birth year] 68 y.o.  Subjective:   Patient ID:  Eric [name] is a 69 y.o. (DOB [birth year]) male.  Chief Complaint:  Chief Complaint  Patient presents with  . Follow-up    Medication Management  . Anxiety  . Depression  . Fatigue    HPI  Last seen February 05, 2018 [name] presents to the office today for follow-up of OCD and depression and med changes. CO more fatigue.  Trying to balance benefit and SE SSRI.  Increase Lexapro back to 20 mg 3 weeks ago.  Less interest and enjoyment and motivation and energy.  Wonders if it's depression.  Anxiety is better, no panic.  Usually plenty of sleep.  Not productive enough.  Asks if son can switch providers.  He's also seeing Cari Caraway, counseling.  Has to change PCP due to change in health insurance and very upset over this.  Better remember Wellbutrin.  Gets it most of the time.  Change back to ropinirole from pramipexole resolved the sexual SE and still mangaged the RLS without worsening depression.  Still sig OCD with anxiety including affecting sleep at times.  Sexual functioning is better with less Lexapro.    Also discussed stressor of renewed relationship with mentally and physicallly ill friend.  Mood a little better but still afraid to start projects out of fear that he'll make mistakes.  If takes higher modafinil doesn't seem to do much more. Nuvigil better but higher BP and more sexual SE.  Caffeine varies from none to 5 cups.  No sig risperidone bc no response for anxiety.  Asks to increase it prn.  Prior psychiatric medication trials include Lexapro, citalopram NR, clomipramine, paroxetine, fluoxetine, Luvox, Trintellix,  Abilify 10 fatigue, bupropion, Cerefolin NAC, and modafinil and Nuvigil, pramipexole, remotely took Adderall and Ritalin.   Review of Systems:  Review of Systems  Constitutional: Positive for fatigue.  Respiratory: Negative for shortness  of breath.   Musculoskeletal: Positive for arthralgias and myalgias.  Neurological: Negative for tremors and weakness.  Psychiatric/Behavioral: Positive for dysphoric mood. Negative for agitation, behavioral problems, confusion, decreased concentration, hallucinations, self-injury, sleep disturbance and suicidal ideas. The patient is nervous/anxious. The patient is not hyperactive.     Medications: I have reviewed the patient's current medications.  Current Outpatient Medications  Medication Sig Dispense Refill  . ALPRAZolam (XANAX) 0.25 MG tablet Take 0.25 mg by mouth 2 (two) times daily as needed for anxiety or sleep.    Marland Kitchen aspirin 81 MG tablet Take 81 mg by mouth daily.    Marland Kitchen buPROPion (WELLBUTRIN XL) 150 MG 24 hr tablet Take 3 tablets (450 mg total) by mouth daily. 270 tablet 0  . Cholecalciferol (VITAMIN D-3) 5000 units TABS Take 5,000 Units by mouth daily.    . Coenzyme Q10 200 MG TABS Take 300 mg by mouth daily.     . CRESTOR 5 MG tablet Take 5 mg by mouth daily.  2  . desonide (DESOWEN) 0.05 % ointment   0  . econazole nitrate 1 % cream Apply topically daily.    Marland Kitchen escitalopram (LEXAPRO) 20 MG tablet Take 0.5 tablets (10 mg total) by mouth daily. (Patient taking differently: Take 20 mg by mouth daily. ) 30 tablet 0  . lithium carbonate 150 MG capsule Take 150 mg by mouth at bedtime.    . modafinil (PROVIGIL) 100 MG tablet Take 100 mg by mouth daily.    . mupirocin ointment (BACTROBAN) 2 %   1  .  risperiDONE (RISPERDAL) 0.25 MG tablet Take 0.25 mg by mouth daily as needed (tends to brighten mood when needed).     Marland Kitchen. rOPINIRole (REQUIP) 1 MG tablet Take 2 mg by mouth at bedtime.     Marland Kitchen. LACTOBACILLUS PO Take by mouth.     No current facility-administered medications for this visit.     Medication Side Effects: None sexual SE are better not  All gone.  Allergies:  Allergies  Allergen Reactions  . Erythromycin Hives    Past Medical History:  Diagnosis Date  . Allergy   . Anemia     iron  . Carotid artery occlusion   . Hyperlipidemia   . Obesity   . OCD (obsessive compulsive disorder)   . Periodic limb movement disorder   . Sleep apnea     Family History  Problem Relation Age of Onset  . Cancer Mother        breast and ovarian  . Depression Father        bi-polar    Social History   Socioeconomic History  . Marital status: Married    Spouse name: Not on file  . Number of children: Not on file  . Years of education: Not on file  . Highest education level: Not on file  Occupational History  . Not on file  Social Needs  . Financial resource strain: Not on file  . Food insecurity:    Worry: Not on file    Inability: Not on file  . Transportation needs:    Medical: Not on file    Non-medical: Not on file  Tobacco Use  . Smoking status: Never Smoker  . Smokeless tobacco: Never Used  Substance and Sexual Activity  . Alcohol use: No  . Drug use: No  . Sexual activity: Not on file  Lifestyle  . Physical activity:    Days per week: Not on file    Minutes per session: Not on file  . Stress: Not on file  Relationships  . Social connections:    Talks on phone: Not on file    Gets together: Not on file    Attends religious service: Not on file    Active member of club or organization: Not on file    Attends meetings of clubs or organizations: Not on file    Relationship status: Not on file  . Intimate partner violence:    Fear of current or ex partner: Not on file    Emotionally abused: Not on file    Physically abused: Not on file    Forced sexual activity: Not on file  Other Topics Concern  . Not on file  Social History Narrative  . Not on file    Past Medical History, Surgical history, Social history, and Family history were reviewed and updated as appropriate.   Please see review of systems for further details on the patient's review from today.   Objective:   Physical Exam:  There were no vitals taken for this visit.  Physical  Exam Neurological:     Motor: No tremor.     Gait: Gait normal.     Comments: Fidgety per usual.  Psychiatric:        Attention and Perception: He is attentive.        Mood and Affect: Mood is anxious and depressed. Affect is not angry.        Speech: Speech is not rapid and pressured or slurred.  Behavior: Behavior normal.        Thought Content: Thought content is not paranoid. Thought content does not include homicidal or suicidal ideation.        Judgment: Judgment normal.     Comments: Insight and judgment good. Obsessions with anxiety continue.     Lab Review:  No results found for: NA, K, CL, CO2, GLUCOSE, BUN, CREATININE, CALCIUM, PROT, ALBUMIN, AST, ALT, ALKPHOS, BILITOT, GFRNONAA, GFRAA  No results found for: WBC, RBC, HGB, HCT, PLT, MCV, MCH, MCHC, RDW, LYMPHSABS, MONOABS, EOSABS, BASOSABS  No results found for: POCLITH, LITHIUM   No results found for: PHENYTOIN, PHENOBARB, VALPROATE, CBMZ   .res Assessment: Plan:    No diagnosis found.  Mr. Paulita Cradleendergraft has a long history of depression and OCD which are partially controlled.  He believes the depression is a consequence of the residual OCD.  He has some compulsive checking and obsessions around the house maintenance.  He wishes to avoid sexual side effects and so we are keeping the SSRI at the lowest possible dose.  He is tried all of the reasonable SSRI options with the exception possibly of sertraline but it is likely to have more sexual dysfunction than what he is taking now.  When travels then tends to have less OCD bc triggered less.  However the anxiety is worse since his last visit.  We discussed at length the gradual increased benefit when SSRIs are added and the very slow sometimes loss of benefit when they are withdrawn.  This could be a factor.  Continue the increase in the Lexapro back to 20 mg daily for 3 more weeks, total 6 weeks and then make decisions about depression treatment..  Dose cut  To 10 in  March 2019.  No problem with the switch back to ropinirole. RLS managed.  Hold new supplement for a week to see if it's causing anxiety(Dr. Gundry's Restore).  Ingredients reviewed with him.  Some of these are unknown to me.  Counseled patient regarding potential benefits, risks, and side effects of lithium to include potential risk of lithium affecting thyroid and renal function.  Discussed need for periodic lab monitoring to determine drug level and to assess for potential adverse effects.  Counseled patient regarding signs and symptoms of lithium toxicity and advised that they notify office immediately or seek urgent medical attention if experiencing these signs and symptoms.  Patient advised to contact office with any questions or concerns.  Vitamin D level acceptable at 54.5.  Disc value of Vitamin D.  OK increase risperidone to double dosage prn anxiety.  Disc fatigue risk. Discussed potential metabolic side effects associated with atypical antipsychotics, as well as potential risk for movement side effects. Advised pt to contact office if movement side effects occur.   We discussed the role of the Wellbutrin at helping his mood and his energy level.  He's more consistent.  He's using Nuvigil or Provigil on prn basis for excessive sleepiness related to obstructive sleep apnea.    If still low energy and depression at follow up trial Ritalin.  Option Rexulti.   Follow-up 3 weeks  This appointment was 40 minutes  Meredith Staggersarey Cottle MD, DFAPA. Please see After Visit Summary for patient specific instructions.  Future Appointments  Date Time Provider Department Center  03/21/2018 11:15 AM Cottle, Steva Readyarey G Jr., MD [facility] None  04/09/2018  1:30 PM Cottle, Steva Readyarey G Jr., MD [facility] None    No orders of the defined types were placed in this encounter.     -------------------------------

## 2018-03-05 DIAGNOSIS — Z23 Encounter for immunization: Secondary | ICD-10-CM | POA: Diagnosis not present

## 2018-03-21 ENCOUNTER — Encounter: Payer: Self-pay | Admitting: Psychiatry

## 2018-03-21 ENCOUNTER — Ambulatory Visit: Payer: No Typology Code available for payment source | Admitting: Psychiatry

## 2018-03-21 DIAGNOSIS — G2581 Restless legs syndrome: Secondary | ICD-10-CM

## 2018-03-21 DIAGNOSIS — F5221 Male erectile disorder: Secondary | ICD-10-CM | POA: Diagnosis not present

## 2018-03-21 DIAGNOSIS — R7989 Other specified abnormal findings of blood chemistry: Secondary | ICD-10-CM

## 2018-03-21 DIAGNOSIS — F331 Major depressive disorder, recurrent, moderate: Secondary | ICD-10-CM | POA: Diagnosis not present

## 2018-03-21 DIAGNOSIS — F422 Mixed obsessional thoughts and acts: Secondary | ICD-10-CM | POA: Diagnosis not present

## 2018-03-21 DIAGNOSIS — G4733 Obstructive sleep apnea (adult) (pediatric): Secondary | ICD-10-CM

## 2018-03-21 MED ORDER — METHYLPHENIDATE HCL 10 MG PO TABS: 10.0000 mg | ORAL_TABLET | Freq: Four times a day (QID) | ORAL | 0 refills | Status: DC

## 2018-03-21 NOTE — Patient Instructions (Signed)
Start with 1 methylphenidate 3 times daily for 1 week, Then 1.5 tablet 3 times daily for 1 week, If needed up to 2 tablets 3 times daily.

## 2018-03-21 NOTE — Progress Notes (Signed)
Eric [name] [id number] 1950/11/19 68 y.o.  Subjective:   Patient ID:  Eric [name] is a 68 y.o. (DOB 1950-03-29) male.  Chief Complaint:  Chief Complaint  Patient presents with  . Follow-up    Medication management    HPI  Last seen Feb 28, 2018 Eric [name] presents to the office today for follow-up of OCD and depression and med changes. CO  Fatigue without change.  More consistent with the Wellbutrin but not perfect.  Tends to stay up too late, trouble letting things go.    Anxiety is better off the Dr. Drue [name] supplement.  Trying to balance benefit and SE SSRI.  Increase Lexapro back to 20 mg 6 weeks ago.  It seems to be fine.  Better with the OCD overall at the higher dose and managing the Sexual Se with drug holidays.  Still poor interest and enjoyment and motivation and energy.  Wonders if it's depression.  Anxiety is better, no panic.  Usually plenty of sleep.  Not productive enough.  Change back to ropinirole from pramipexole resolved the sexual SE and still mangaged the RLS without worsening depression.  RLS managed.  Still sig OCD with anxiety including affecting sleep at times.  Sexual functioning was better with less Lexapro.    If takes higher modafinil doesn't seem to do much more. Nuvigil better but higher BP and more sexual SE. he asks about other medications potentially that might help energy and motivation without causing sexual side effects.  Caffeine varies from none to 5 cups.  No sig risperidone bc no response for anxiety.  Asks to increase it prn.  Prior psychiatric medication trials include Lexapro, citalopram NR, clomipramine, paroxetine, fluoxetine, Luvox, Trintellix,  Abilify 10 fatigue, bupropion, Cerefolin NAC, and modafinil and Nuvigil, pramipexole, remotely took Adderall and Ritalin.   Review of Systems:  Review of Systems  Constitutional: Positive for fatigue.  Respiratory: Negative for shortness of breath.    Musculoskeletal: Positive for arthralgias and myalgias.  Neurological: Negative for tremors and weakness.  Psychiatric/Behavioral: Positive for dysphoric mood. Negative for agitation, behavioral problems, confusion, decreased concentration, hallucinations, self-injury, sleep disturbance and suicidal ideas. The patient is nervous/anxious. The patient is not hyperactive.     Medications: I have reviewed the patient's current medications.  Current Outpatient Medications  Medication Sig Dispense Refill  . ALPRAZolam (XANAX) 0.25 MG tablet Take 0.25 mg by mouth 2 (two) times daily as needed for anxiety or sleep.    Marland Kitchen aspirin 81 MG tablet Take 81 mg by mouth daily.    Marland Kitchen buPROPion (WELLBUTRIN XL) 150 MG 24 hr tablet Take 3 tablets (450 mg total) by mouth daily. 270 tablet 0  . Cholecalciferol (VITAMIN D-3) 5000 units TABS Take 5,000 Units by mouth daily.    . Coenzyme Q10 200 MG TABS Take 300 mg by mouth daily.     . CRESTOR 5 MG tablet Take 5 mg by mouth daily.  2  . desonide (DESOWEN) 0.05 % ointment   0  . econazole nitrate 1 % cream Apply topically daily.    Marland Kitchen escitalopram (LEXAPRO) 20 MG tablet Take 0.5 tablets (10 mg total) by mouth daily. (Patient taking differently: Take 20 mg by mouth daily. ) 30 tablet 0  . lithium carbonate 150 MG capsule Take 150 mg by mouth at bedtime.    . modafinil (PROVIGIL) 100 MG tablet Take 100 mg by mouth daily.    . mupirocin ointment (BACTROBAN) 2 %   1  .  risperiDONE (RISPERDAL) 0.25 MG tablet Take 0.25 mg by mouth daily as needed (tends to brighten mood when needed).     Marland Kitchen rOPINIRole (REQUIP) 1 MG tablet Take 2 mg by mouth at bedtime.     . methylphenidate (RITALIN) 10 MG tablet Take 1 tablet (10 mg total) by mouth 4 (four) times daily. 120 tablet 0   No current facility-administered medications for this visit.     Medication Side Effects: None sexual SE are better not  All gone.  Allergies:  Allergies  Allergen Reactions  . Erythromycin Hives     Past Medical History:  Diagnosis Date  . Allergy   . Anemia    iron  . Carotid artery occlusion   . Hyperlipidemia   . Obesity   . OCD (obsessive compulsive disorder)   . Periodic limb movement disorder   . Sleep apnea     Family History  Problem Relation Age of Onset  . Cancer Mother        breast and ovarian  . Depression Father        bi-polar    Social History   Socioeconomic History  . Marital status: Married    Spouse name: Not on file  . Number of children: Not on file  . Years of education: Not on file  . Highest education level: Not on file  Occupational History  . Not on file  Social Needs  . Financial resource strain: Not on file  . Food insecurity:    Worry: Not on file    Inability: Not on file  . Transportation needs:    Medical: Not on file    Non-medical: Not on file  Tobacco Use  . Smoking status: Never Smoker  . Smokeless tobacco: Never Used  Substance and Sexual Activity  . Alcohol use: No  . Drug use: No  . Sexual activity: Not on file  Lifestyle  . Physical activity:    Days per week: Not on file    Minutes per session: Not on file  . Stress: Not on file  Relationships  . Social connections:    Talks on phone: Not on file    Gets together: Not on file    Attends religious service: Not on file    Active member of club or organization: Not on file    Attends meetings of clubs or organizations: Not on file    Relationship status: Not on file  . Intimate partner violence:    Fear of current or ex partner: Not on file    Emotionally abused: Not on file    Physically abused: Not on file    Forced sexual activity: Not on file  Other Topics Concern  . Not on file  Social History Narrative  . Not on file    Past Medical History, Surgical history, Social history, and Family history were reviewed and updated as appropriate.   Please see review of systems for further details on the patient's review from today.   Objective:    Physical Exam:  There were no vitals taken for this visit.  Physical Exam Neurological:     Motor: No tremor.     Gait: Gait normal.     Comments: Fidgety per usual.  Psychiatric:        Attention and Perception: He is attentive.        Mood and Affect: Mood is anxious and depressed. Affect is not angry.        Speech:  Speech is not rapid and pressured or slurred.        Behavior: Behavior normal.        Thought Content: Thought content is not paranoid. Thought content does not include homicidal or suicidal ideation.        Judgment: Judgment normal.     Comments: Insight and judgment good. Obsessions with anxiety continue.     Lab Review:  No results found for: NA, K, CL, CO2, GLUCOSE, BUN, CREATININE, CALCIUM, PROT, ALBUMIN, AST, ALT, ALKPHOS, BILITOT, GFRNONAA, GFRAA  No results found for: WBC, RBC, HGB, HCT, PLT, MCV, MCH, MCHC, RDW, LYMPHSABS, MONOABS, EOSABS, BASOSABS  No results found for: POCLITH, LITHIUM   No results found for: PHENYTOIN, PHENOBARB, VALPROATE, CBMZ   .res Assessment: Plan:    Major depressive disorder, recurrent episode, moderate (HCC)  Mixed obsessional thoughts and acts  Restless legs syndrome  Erectile disorder, acquired, generalized, moderate  Low vitamin D level  Obstructive sleep apnea  Mr. Eric [name] has a long history of depression and OCD which are partially controlled.  He believes the depression is a consequence of the residual OCD.  He has some compulsive checking and obsessions around the house maintenance.  He wishes to avoid sexual side effects and so we are keeping the SSRI at the lowest possible dose.  He is tried all of the reasonable SSRI options with the exception possibly of sertraline but it is likely to have more sexual dysfunction than what he is taking now.  When travels then tends to have less OCD bc triggered less.  However the anxiety is worse since his last visit.  We discussed at length the gradual increased  benefit when SSRIs are added and the very slow sometimes loss of benefit when they are withdrawn.  This could be a factor.  Better with the increase in the Lexapro back to 20 mg daily for total 6 weeks   Dose cut  To 10 in March 2019.  His depression is not controlled as well as he would like and is affecting his quality of life and his productivity.  In view of multiple med failures it is reasonable to consider stimulant augmentation.  We discussed the different types of stimulants in detail.  Discussed potential benefits, risks, and side effects of stimulants with patient to include increased heart rate, palpitations, insomnia, increased anxiety, increased irritability, or decreased appetite.  Instructed patient to contact office if experiencing any significant tolerability issues. The most data for benefit for depression comes with methylphenidate.  We will start with regular methylphenidate 10 mg 3 times daily and increase up to 20 mg 3 times daily if needed.  We them it may then switch him to a long-acting methylphenidate if he prefers.  He agrees with this plan  No problem with the switch back to ropinirole. RLS managed.  Hold new supplement for a week to see if it's causing anxiety(Dr. Gundry's Restore).  Ingredients reviewed with him.  Some of these are unknown to me.  Counseled patient regarding potential benefits, risks, and side effects of lithium to include potential risk of lithium affecting thyroid and renal function.  Discussed need for periodic lab monitoring to determine drug level and to assess for potential adverse effects.  Counseled patient regarding signs and symptoms of lithium toxicity and advised that they notify office immediately or seek urgent medical attention if experiencing these signs and symptoms.  Patient advised to contact office with any questions or concerns.  Vitamin D level acceptable at  54.5.  Disc value of Vitamin D.  OK increase risperidone to double dosage  prn anxiety.  Disc fatigue risk. Discussed potential metabolic side effects associated with atypical antipsychotics, as well as potential risk for movement side effects. Advised pt to contact office if movement side effects occur.   We discussed the role of the Wellbutrin at helping his mood and his energy level.  He's more consistent.  He's using Nuvigil or Provigil on prn basis for excessive sleepiness related to obstructive sleep apnea.    If still low energy and depression at follow up trial Ritalin.  Option Rexulti.   Ritalin 10-20 TID.  This was a 40-minute appointment  Follow-up 3 weeks  This appointment was 40 minutes  Meredith Staggers MD, DFAPA. Please see After Visit Summary for patient specific instructions.  Future Appointments  Date Time Provider Department Center  04/09/2018  1:30 PM Cottle, Steva Ready., MD [facility] None  05/01/2018  1:00 PM Cottle, Steva Ready., MD [facility] None  05/23/2018  9:30 AM Cottle, Steva Ready., MD [facility] None  06/13/2018  9:00 AM Cottle, Steva Ready., MD [facility] None    No orders of the defined types were placed in this encounter.     -------------------------------

## 2018-03-25 DIAGNOSIS — G4733 Obstructive sleep apnea (adult) (pediatric): Secondary | ICD-10-CM | POA: Diagnosis not present

## 2018-03-26 ENCOUNTER — Telehealth: Payer: Self-pay

## 2018-03-26 ENCOUNTER — Other Ambulatory Visit: Payer: Self-pay | Admitting: Psychiatry

## 2018-03-26 MED ORDER — METHYLPHENIDATE HCL 10 MG PO TABS: 10.0000 mg | ORAL_TABLET | Freq: Four times a day (QID) | ORAL | 0 refills | Status: DC

## 2018-03-26 NOTE — Telephone Encounter (Signed)
Prior authorization submitted on methylphenidate 10mg  #120 through [facility], denied due to diagnosis and not being a medically accepted indication. Pt aware and recommend he use [facility], retail is $23.00/month supply. Pt agrees and will submit rx to [facility]. Provider aware of change.

## 2018-03-27 ENCOUNTER — Telehealth: Payer: Self-pay | Admitting: Psychiatry

## 2018-03-27 NOTE — Telephone Encounter (Signed)
Pt left message he checked [facility] for Rx. Pharm stated they did not get Rx. Please send and advise per Pt

## 2018-03-27 NOTE — Telephone Encounter (Signed)
Was submitted yesterday to [facility], needs to check with pharmacy

## 2018-04-02 DIAGNOSIS — Z79899 Other long term (current) drug therapy: Secondary | ICD-10-CM | POA: Diagnosis not present

## 2018-04-02 DIAGNOSIS — R7303 Prediabetes: Secondary | ICD-10-CM | POA: Diagnosis not present

## 2018-04-03 ENCOUNTER — Other Ambulatory Visit: Payer: Self-pay

## 2018-04-03 MED ORDER — ESCITALOPRAM OXALATE 20 MG PO TABS: 20.0000 mg | ORAL_TABLET | Freq: Every day | ORAL | 1 refills | Status: DC

## 2018-04-04 DIAGNOSIS — E669 Obesity, unspecified: Secondary | ICD-10-CM | POA: Diagnosis not present

## 2018-04-04 DIAGNOSIS — R7303 Prediabetes: Secondary | ICD-10-CM | POA: Diagnosis not present

## 2018-04-04 DIAGNOSIS — E7800 Pure hypercholesterolemia, unspecified: Secondary | ICD-10-CM | POA: Diagnosis not present

## 2018-04-09 ENCOUNTER — Ambulatory Visit: Payer: No Typology Code available for payment source | Admitting: Psychiatry

## 2018-04-09 ENCOUNTER — Encounter: Payer: Self-pay | Admitting: Psychiatry

## 2018-04-09 ENCOUNTER — Other Ambulatory Visit: Payer: Self-pay

## 2018-04-09 VITALS — BP 123/70 | HR 69

## 2018-04-09 DIAGNOSIS — F422 Mixed obsessional thoughts and acts: Secondary | ICD-10-CM

## 2018-04-09 DIAGNOSIS — G2581 Restless legs syndrome: Secondary | ICD-10-CM

## 2018-04-09 DIAGNOSIS — F5221 Male erectile disorder: Secondary | ICD-10-CM | POA: Diagnosis not present

## 2018-04-09 DIAGNOSIS — F331 Major depressive disorder, recurrent, moderate: Secondary | ICD-10-CM | POA: Diagnosis not present

## 2018-04-09 DIAGNOSIS — R7989 Other specified abnormal findings of blood chemistry: Secondary | ICD-10-CM

## 2018-04-09 MED ORDER — METHYLPHENIDATE HCL 10 MG PO TABS: 20.0000 mg | ORAL_TABLET | Freq: Two times a day (BID) | ORAL | 0 refills | Status: DC

## 2018-04-09 NOTE — Progress Notes (Signed)
Eric [name] [id number] 1950-03-11 68 y.o.  Subjective:   Patient ID:  Eric [name] is a 68 y.o. (DOB 02/11/1950) male.  Chief Complaint:  Chief Complaint  Patient presents with  . Follow-up    Medication Management  . Depression    HPI  Last seen Mar 21, 2018.  Also called wife on phone. Eric [name] presents to the office today for follow-up of OCD and depression and med changes.  At last visit added Ritalin and helped him be more alert and more focused.  Sees the benefit.  No sleep interruption.  Uncertain about mood.  Better productivity.  Overall pleased though some sexual dysfunction with it so has to time it right.  Typically taking it twice daily at 20 mg.    Per wife:  Less excess sleep.  More chipper.  No SE concerns per wife.  Not taking modafinil.  Trying to balance benefit and SE SSRI.  Increase Lexapro back to 20 mg January.  It seems to be fine.  Better with the OCD overall at the higher dose and managing the Sexual Se with drug holidays.   Anxiety is better, no panic.  Usually plenty of sleep.  Not productive enough.  Change back to ropinirole from pramipexole resolved the sexual SE and still mangaged the RLS without worsening depression.  RLS managed unless stays up too late.   Still sig OCD with anxiety including affecting sleep at times.  Sexual functioning was better with less Lexapro.    If takes higher modafinil doesn't seem to do much more. Nuvigil better but higher BP and more sexual SE. he asks about other medications potentially that might help energy and motivation without causing sexual side effects.  Caffeine varies from none to 5 cups.  Some response from risperidone  At 0.5mg  bc no response for anxiety.  Asks to increase it prn.  Rare alprazolam.  Doesn't want to become habituated to meds and has questions about this.  Prior psychiatric medication trials include Lexapro, citalopram NR, clomipramine, paroxetine,  fluoxetine, Luvox, Trintellix,  Abilify 10 fatigue, bupropion, Cerefolin NAC, and modafinil and Nuvigil, pramipexole, remotely took Adderall and Ritalin.  Review of Systems:  Review of Systems  Constitutional: Negative for fatigue.  Respiratory: Negative for shortness of breath.   Musculoskeletal: Positive for arthralgias and myalgias.  Neurological: Negative for tremors and weakness.  Psychiatric/Behavioral: Positive for dysphoric mood. Negative for agitation, behavioral problems, confusion, decreased concentration, hallucinations, self-injury, sleep disturbance and suicidal ideas. The patient is nervous/anxious. The patient is not hyperactive.     Medications: I have reviewed the patient's current medications.  Current Outpatient Medications  Medication Sig Dispense Refill  . ALPRAZolam (XANAX) 0.25 MG tablet Take 0.25 mg by mouth 2 (two) times daily as needed for anxiety or sleep.    Marland Kitchen aspirin 81 MG tablet Take 81 mg by mouth daily.    Marland Kitchen buPROPion (WELLBUTRIN XL) 150 MG 24 hr tablet Take 3 tablets (450 mg total) by mouth daily. 270 tablet 0  . Cholecalciferol (VITAMIN D-3) 5000 units TABS Take 5,000 Units by mouth daily.    . Coenzyme Q10 200 MG TABS Take 300 mg by mouth daily.     . CRESTOR 5 MG tablet Take 5 mg by mouth daily.  2  . desonide (DESOWEN) 0.05 % ointment   0  . econazole nitrate 1 % cream Apply topically daily.    Marland Kitchen escitalopram (LEXAPRO) 20 MG tablet Take 1 tablet (20 mg total) by mouth  daily. 30 tablet 1  . lithium carbonate 150 MG capsule Take 150 mg by mouth at bedtime.    . methylphenidate (RITALIN) 10 MG tablet Take 2 tablets (20 mg total) by mouth 2 (two) times daily with breakfast and lunch. 360 tablet 0  . modafinil (PROVIGIL) 100 MG tablet Take 100 mg by mouth daily.    . mupirocin ointment (BACTROBAN) 2 %   1  . risperiDONE (RISPERDAL) 0.25 MG tablet Take 0.25 mg by mouth daily as needed (tends to brighten mood when needed).     Marland Kitchen rOPINIRole (REQUIP) 1 MG  tablet TAKE 3 TABLETS (3 MG TOTAL) BY MOUTH AT BEDTIME. (Patient taking differently: 2 mg. ) 90 tablet 2   No current facility-administered medications for this visit.     Medication Side Effects: None sexual SE are better not  All gone.  Allergies:  Allergies  Allergen Reactions  . Erythromycin Hives    Past Medical History:  Diagnosis Date  . Allergy   . Anemia    iron  . Carotid artery occlusion   . Hyperlipidemia   . Obesity   . OCD (obsessive compulsive disorder)   . Periodic limb movement disorder   . Sleep apnea     Family History  Problem Relation Age of Onset  . Cancer Mother        breast and ovarian  . Depression Father        bi-polar    Social History   Socioeconomic History  . Marital status: Married    Spouse name: Not on file  . Number of children: Not on file  . Years of education: Not on file  . Highest education level: Not on file  Occupational History  . Not on file  Social Needs  . Financial resource strain: Not on file  . Food insecurity:    Worry: Not on file    Inability: Not on file  . Transportation needs:    Medical: Not on file    Non-medical: Not on file  Tobacco Use  . Smoking status: Never Smoker  . Smokeless tobacco: Never Used  Substance and Sexual Activity  . Alcohol use: No  . Drug use: No  . Sexual activity: Not on file  Lifestyle  . Physical activity:    Days per week: Not on file    Minutes per session: Not on file  . Stress: Not on file  Relationships  . Social connections:    Talks on phone: Not on file    Gets together: Not on file    Attends religious service: Not on file    Active member of club or organization: Not on file    Attends meetings of clubs or organizations: Not on file    Relationship status: Not on file  . Intimate partner violence:    Fear of current or ex partner: Not on file    Emotionally abused: Not on file    Physically abused: Not on file    Forced sexual activity: Not on file   Other Topics Concern  . Not on file  Social History Narrative  . Not on file    Past Medical History, Surgical history, Social history, and Family history were reviewed and updated as appropriate.   Please see review of systems for further details on the patient's review from today.   Objective:   Physical Exam:  BP 123/70 (BP Location: Right Arm)   Pulse 69   Physical Exam Neurological:  Motor: No tremor.     Gait: Gait normal.     Comments: Fidgety per usual.  Psychiatric:        Attention and Perception: He is attentive.        Mood and Affect: Mood is anxious and depressed. Affect is not angry.        Speech: Speech is not rapid and pressured or slurred.        Behavior: Behavior normal.        Thought Content: Thought content is not paranoid. Thought content does not include homicidal or suicidal ideation.        Judgment: Judgment normal.     Comments: Insight and judgment good. Obsessions with anxiety continue.     Lab Review:  No results found for: NA, K, CL, CO2, GLUCOSE, BUN, CREATININE, CALCIUM, PROT, ALBUMIN, AST, ALT, ALKPHOS, BILITOT, GFRNONAA, GFRAA  No results found for: WBC, RBC, HGB, HCT, PLT, MCV, MCH, MCHC, RDW, LYMPHSABS, MONOABS, EOSABS, BASOSABS  No results found for: POCLITH, LITHIUM   No results found for: PHENYTOIN, PHENOBARB, VALPROATE, CBMZ  Vitamin D level acceptable at 54.5.   Marland Kitchenres Assessment: Plan:    Major depressive disorder, recurrent episode, moderate (HCC)  Mixed obsessional thoughts and acts  Restless legs syndrome  Erectile disorder, acquired, generalized, moderate  Low vitamin D level  Mr. Wickersham has a long history of depression and OCD which are partially controlled.  He believes the depression is a consequence of the residual OCD.  He has some compulsive checking and obsessions around the house maintenance.  He wishes to avoid sexual side effects and so we are keeping the SSRI at the lowest possible dose.  He is  tried all of the reasonable SSRI options with the exception possibly of sertraline but it is likely to have more sexual dysfunction than what he is taking now.  When travels then tends to have less OCD bc triggered less.  However the anxiety is worse since his last visit.  We discussed at length the gradual increased benefit when SSRIs are added and the very slow sometimes loss of benefit when they are withdrawn.  This could be a factor.  Better with the increase in the Lexapro back to 20 mg daily.   Dose cut  To 10 in March 2019.  His depression is improved on the Ritalin.  We discussed the different types of stimulants in detail.  Discussed potential benefits, risks, and side effects of stimulants with patient to include increased heart rate, palpitations, insomnia, increased anxiety, increased irritability, or decreased appetite.  Answered his questions about tolerance and withdrawal.  Instructed patient to contact office if experiencing any significant tolerability issues.  We will continue the benefit by continuing methylphenidate up to 40 mg daily.  We will stick with a short acting because his insurance refuses to cover it.  No problem with the switch back to ropinirole. RLS managed.  OK increase risperidone to double dosage prn anxiety.  Disc fatigue risk. Discussed potential metabolic side effects associated with atypical antipsychotics, as well as potential risk for movement side effects. Advised pt to contact office if movement side effects occur.  Discussed the potential benefit and side effects for anxiety of risperidone versus alprazolam.  The dosages are low of each.  He shows no evidence of abuse. We discussed the short-term risks associated with benzodiazepines including sedation and increased fall risk among others.  Discussed long-term side effect risk including dependence, potential withdrawal symptoms, and the potential eventual  dose-related risk of dementia. We discussed the role of  the Wellbutrin at helping his mood and his energy level.  He's more consistent.  He's stopped using Nuvigil or Provigil on prn basis for excessive sleepiness related to obstructive sleep apnea.     Option Rexulti.   This was a 30-minute appointment  Follow-up 3 weeks  Meredith Staggers MD, DFAPA. Please see After Visit Summary for patient specific instructions.  Future Appointments  Date Time Provider Department Center  05/01/2018  1:00 PM Cottle, Steva Ready., MD [facility] None  05/23/2018  9:30 AM Cottle, Steva Ready., MD [facility] None  06/13/2018  9:00 AM Cottle, Steva Ready., MD [facility] None    No orders of the defined types were placed in this encounter.     -------------------------------

## 2018-04-25 DIAGNOSIS — G4733 Obstructive sleep apnea (adult) (pediatric): Secondary | ICD-10-CM | POA: Diagnosis not present

## 2018-05-01 ENCOUNTER — Other Ambulatory Visit: Payer: Self-pay

## 2018-05-01 ENCOUNTER — Encounter: Payer: Self-pay | Admitting: Psychiatry

## 2018-05-01 ENCOUNTER — Ambulatory Visit (INDEPENDENT_AMBULATORY_CARE_PROVIDER_SITE_OTHER): Payer: No Typology Code available for payment source | Admitting: Psychiatry

## 2018-05-01 DIAGNOSIS — G2581 Restless legs syndrome: Secondary | ICD-10-CM | POA: Diagnosis not present

## 2018-05-01 DIAGNOSIS — R7989 Other specified abnormal findings of blood chemistry: Secondary | ICD-10-CM

## 2018-05-01 DIAGNOSIS — G4733 Obstructive sleep apnea (adult) (pediatric): Secondary | ICD-10-CM | POA: Diagnosis not present

## 2018-05-01 DIAGNOSIS — F331 Major depressive disorder, recurrent, moderate: Secondary | ICD-10-CM | POA: Diagnosis not present

## 2018-05-01 DIAGNOSIS — F422 Mixed obsessional thoughts and acts: Secondary | ICD-10-CM | POA: Diagnosis not present

## 2018-05-01 NOTE — Progress Notes (Signed)
Eric [name] [id number] February 02, 1950 68 y.o.  Subjective:   Patient ID:  Eric [name] is a 68 y.o. (DOB [birth year]) male.  Chief Complaint:  No chief complaint on file.   HPI   [name] presents to the office today for follow-up of OCD and depression and med changes. Last seen April 09, 2018.  He asked to increase the as needed risperidone for anxiety because he finds it helpful.  That was agreed to.  Drugs still interfere with sex but still takes drug holidays.  OCD not over health.  Anxiety managed without extra risperidone lately.  Pretty good and stable.  Trying to take Ritalin more. Asked about duration.   RLS managed ok unless stays up too late.   Added Ritalin and helped him be more alert and more focused.  Sees the benefit.  No sleep interruption.  No increase in BP.  Uncertain about mood.  Better productivity.  Overall pleased though some sexual dysfunction with it so has to time it right.  Typically taking it once daily at 20 mg-30 mg.  Doesn't feel he needs it twice daily most of the time.    Per wife:  Less excess sleep.  More chipper.  No SE concerns per wife.  Not taking modafinil.    Trying to balance benefit and SE SSRI.  Increase Lexapro back to 20 mg January.  It seems to be fine.  Better with the OCD overall at the higher dose and managing the Sexual Se with drug holidays.   Anxiety is better, no panic.  Usually plenty of sleep.  Not productive enough.  Change back to ropinirole from pramipexole resolved the sexual SE and still mangaged the RLS without worsening depression.  RLS managed unless stays up too late.   Still sig OCD with anxiety including affecting sleep at times.  Sexual functioning was better with less Lexapro.    If takes higher modafinil doesn't seem to do much more. Nuvigil better but higher BP and more sexual SE. he asks about other medications potentially that might help energy and motivation without causing sexual  side effects.  Caffeine varies from none to 5 cups.  Some response from risperidone  At 0.5mg  bc no response for anxiety.  Asks to increase it prn.  Rare alprazolam.  Doesn't want to become habituated to meds and has questions about this.  Prior psychiatric medication trials include Lexapro, citalopram NR, clomipramine, paroxetine, fluoxetine, Luvox, Trintellix,  Abilify 10 fatigue, bupropion, Cerefolin NAC, and modafinil and Nuvigil, pramipexole, remotely took Adderall and Ritalin.  Review of Systems:  Review of Systems  Constitutional: Negative for fatigue.  Respiratory: Negative for shortness of breath.   Musculoskeletal: Positive for arthralgias and myalgias.  Neurological: Negative for tremors and weakness.  Psychiatric/Behavioral: Positive for dysphoric mood. Negative for agitation, behavioral problems, confusion, decreased concentration, hallucinations, self-injury, sleep disturbance and suicidal ideas. The patient is nervous/anxious. The patient is not hyperactive.     Medications: I have reviewed the patient's current medications.  Current Outpatient Medications  Medication Sig Dispense Refill  . ALPRAZolam (XANAX) 0.25 MG tablet Take 0.25 mg by mouth 2 (two) times daily as needed for anxiety or sleep.    Marland Kitchen aspirin 81 MG tablet Take 81 mg by mouth daily.    Marland Kitchen buPROPion (WELLBUTRIN XL) 150 MG 24 hr tablet Take 3 tablets (450 mg total) by mouth daily. 270 tablet 0  . Cholecalciferol (VITAMIN D-3) 5000 units TABS Take 5,000 Units by mouth daily.    Marland Kitchen  Coenzyme Q10 200 MG TABS Take 300 mg by mouth daily.     . CRESTOR 5 MG tablet Take 5 mg by mouth daily.  2  . desonide (DESOWEN) 0.05 % ointment   0  . escitalopram (LEXAPRO) 20 MG tablet Take 1 tablet (20 mg total) by mouth daily. 30 tablet 1  . lithium carbonate 150 MG capsule Take 150 mg by mouth at bedtime.    . methylphenidate (RITALIN) 10 MG tablet Take 2 tablets (20 mg total) by mouth 2 (two) times daily with breakfast and  lunch. 360 tablet 0  . risperiDONE (RISPERDAL) 0.25 MG tablet Take 0.25 mg by mouth daily as needed (tends to brighten mood when needed).     Marland Kitchen. rOPINIRole (REQUIP) 1 MG tablet TAKE 3 TABLETS (3 MG TOTAL) BY MOUTH AT BEDTIME. (Patient taking differently: 2 mg. ) 90 tablet 2  . econazole nitrate 1 % cream Apply topically daily.    . modafinil (PROVIGIL) 100 MG tablet Take 100 mg by mouth daily.    . mupirocin ointment (BACTROBAN) 2 %   1   No current facility-administered medications for this visit.     Medication Side Effects: None sexual SE are better not  All gone.  Allergies:  Allergies  Allergen Reactions  . Erythromycin Hives    Past Medical History:  Diagnosis Date  . Allergy   . Anemia    iron  . Carotid artery occlusion   . Hyperlipidemia   . Obesity   . OCD (obsessive compulsive disorder)   . Periodic limb movement disorder   . Sleep apnea     Family History  Problem Relation Age of Onset  . Cancer Mother        breast and ovarian  . Depression Father        bi-polar    Social History   Socioeconomic History  . Marital status: Married    Spouse name: Not on file  . Number of children: Not on file  . Years of education: Not on file  . Highest education level: Not on file  Occupational History  . Not on file  Social Needs  . Financial resource strain: Not on file  . Food insecurity:    Worry: Not on file    Inability: Not on file  . Transportation needs:    Medical: Not on file    Non-medical: Not on file  Tobacco Use  . Smoking status: Never Smoker  . Smokeless tobacco: Never Used  Substance and Sexual Activity  . Alcohol use: No  . Drug use: No  . Sexual activity: Not on file  Lifestyle  . Physical activity:    Days per week: Not on file    Minutes per session: Not on file  . Stress: Not on file  Relationships  . Social connections:    Talks on phone: Not on file    Gets together: Not on file    Attends religious service: Not on file     Active member of club or organization: Not on file    Attends meetings of clubs or organizations: Not on file    Relationship status: Not on file  . Intimate partner violence:    Fear of current or ex partner: Not on file    Emotionally abused: Not on file    Physically abused: Not on file    Forced sexual activity: Not on file  Other Topics Concern  . Not on file  Social History  Narrative  . Not on file    Past Medical History, Surgical history, Social history, and Family history were reviewed and updated as appropriate.   Please see review of systems for further details on the patient's review from today.   Objective:   Physical Exam:  There were no vitals taken for this visit.  Physical Exam Neurological:     Motor: No tremor.     Gait: Gait normal.     Comments: Fidgety per usual.  Psychiatric:        Attention and Perception: He is attentive.        Mood and Affect: Mood is anxious and depressed. Affect is not angry.        Speech: Speech is not rapid and pressured or slurred.        Behavior: Behavior normal.        Thought Content: Thought content is not paranoid. Thought content does not include homicidal or suicidal ideation.        Judgment: Judgment normal.     Comments: Insight and judgment good. Obsessions with anxiety continue.  But fortunately no obsessions over the Covid virus or his health.     Lab Review:  No results found for: NA, K, CL, CO2, GLUCOSE, BUN, CREATININE, CALCIUM, PROT, ALBUMIN, AST, ALT, ALKPHOS, BILITOT, GFRNONAA, GFRAA  No results found for: WBC, RBC, HGB, HCT, PLT, MCV, MCH, MCHC, RDW, LYMPHSABS, MONOABS, EOSABS, BASOSABS  No results found for: POCLITH, LITHIUM   No results found for: PHENYTOIN, PHENOBARB, VALPROATE, CBMZ  Vitamin D level acceptable at 54.5.   Marland Kitchenres Assessment: Plan:    Mixed obsessional thoughts and acts  Major depressive disorder, recurrent episode, moderate (HCC)  Restless legs syndrome  Obstructive  sleep apnea  Low vitamin D level  Mr. Mccord has a long history of depression and OCD which are partially controlled.  He believes the depression is a consequence of the residual OCD.  He has some compulsive checking and obsessions around the house maintenance.  He wishes to avoid sexual side effects and so we are keeping the SSRI at the lowest possible dose.  He is tried all of the reasonable SSRI options with the exception possibly of sertraline but it is likely to have more sexual dysfunction than what he is taking now.  When travels then tends to have less OCD bc triggered less.  As discussed at the last appointment fortunately his anxiety has improved somewhat over time since we increased the Lexapro the last time.    Better with the increase in the Lexapro back to 20 mg daily.   Dose cut  To 10 in March 2019.  His dep pression is improved with the Ritalin as is his productivity.  We discussed the duration of the stimulant and whether to take it once or twice a day which will be left up to him.  answered his questions about possible withdrawal headaches that he is experiencing the day after taking Ritalin.  This is uncertain I asked him to pay attention to the pattern.. We will continue the benefit by continuing methylphenidate up to 40 mg daily.  We will stick with a short acting because his insurance refuses to cover it.  No problem with the switch back to ropinirole. RLS managed.  He's stopped using Nuvigil or Provigil on prn basis for excessive sleepiness related to obstructive sleep apnea.     Option Rexulti.   This was a 15-minute appointment  Follow-up 3 weeks  Meredith Staggers  MD, DFAPA. Please see After Visit Summary for patient specific instructions.  Future Appointments  Date Time Provider Department Center  05/23/2018  9:30 AM Cottle, Steva Ready., MD [facility] None  06/13/2018  9:00 AM Cottle, Steva Ready., MD [facility] None    No orders of the defined types were placed in this  encounter.     -------------------------------

## 2018-05-23 ENCOUNTER — Other Ambulatory Visit: Payer: Self-pay

## 2018-05-23 ENCOUNTER — Ambulatory Visit: Payer: No Typology Code available for payment source | Admitting: Psychiatry

## 2018-05-23 ENCOUNTER — Encounter: Payer: Self-pay | Admitting: Psychiatry

## 2018-05-23 DIAGNOSIS — G2581 Restless legs syndrome: Secondary | ICD-10-CM | POA: Diagnosis not present

## 2018-05-23 DIAGNOSIS — F331 Major depressive disorder, recurrent, moderate: Secondary | ICD-10-CM | POA: Diagnosis not present

## 2018-05-23 DIAGNOSIS — G4733 Obstructive sleep apnea (adult) (pediatric): Secondary | ICD-10-CM

## 2018-05-23 DIAGNOSIS — F422 Mixed obsessional thoughts and acts: Secondary | ICD-10-CM | POA: Diagnosis not present

## 2018-05-23 DIAGNOSIS — F5221 Male erectile disorder: Secondary | ICD-10-CM

## 2018-05-23 NOTE — Progress Notes (Signed)
Eric BisMichael R Kuhlmann [id number] 15-Feb-1950 68 y.o.   Virtual Visit via WebEx app  I connected with pt by WebEx and verified that I am speaking with the correct person using two identifiers.   I discussed the limitations, risks, security and privacy concerns of performing an evaluation and management service by WebEx and the availability of in person appointments. I also discussed with the patient that there may be a patient responsible charge related to this service. The patient expressed understanding and agreed to proceed.  I discussed the assessment and treatment plan with the patient. The patient was provided an opportunity to ask questions and all were answered. The patient agreed with the plan and demonstrated an understanding of the instructions.   The patient was advised to call back or seek an in-person evaluation if the symptoms worsen or if the condition fails to improve as anticipated.  I provided 35 minutes of non-face-to-face time during this encounter. The call started at [phone] and ended at [phone]. The patient was located at home and the provider was located office.   Subjective:   Patient ID:  Eric [name] is a 68 y.o. (DOB 15-Feb-1950) male.  Chief Complaint:  Chief Complaint  Patient presents with  . Follow-up    Medication Management  . Anxiety    OCD  . ADD    HPI   [name] presents to the office today for follow-up of OCD and depression and med changes.  Last seen April 5.  Worry about BP.  Took Ritalin 30 in am and notices avg 130/73 30 min later.  Overall over time ADD seems worse over time.  Better effect from the Ritalin at 30 mg at a time. Usually only once daily. Added Ritalin and helped him be more alert and more focused.  Sees the benefit.  No sleep interruption.  No increase in BP.  Uncertain about mood.  Better productivity.  Overall pleased though some sexual dysfunction with it so has to time it right.  Typically taking it once daily  at 30 mg.    Not much interest in activity that are physical like gardening but interest Obsessing on light switches and son's salary after a lay off.  Has been able to let it go easier than normal. Drugs still interfere with sex but still takes drug holidays.  OCD not over health.  Anxiety managed without extra risperidone lately.  Pretty good and stable.  Trying to balance benefit and SE SSRI.  Increase Lexapro back to 20 mg January.  It seems to be fine.  Better with the OCD overall at the higher dose and managing the Sexual Se with drug holidays.  Anxiety is better, no panic.  Usually plenty of sleep.  Not taking Xanax daytime only HS and usually not needed. Not productive enough.  Per wife: still excess sleep. Mainly annoys her be he goes to sleep late and gets up late.  Average 8-9 hours.   More chipper.  No SE concerns per wife.  RLS managed ok unless stays up too late.   Not taking modafinil and trying Ritalin instead. If takes higher modafinil doesn't seem to do much more. Nuvigil better but higher BP and more sexual SE. he asks about other medications potentially that might help energy and motivation without causing sexual side effects.  Caffeine varies from none to 5 cups.  Doesn't want to become habituated to meds and has questions about this.   Prior psychiatric medication trials include Lexapro,  citalopram NR, clomipramine, paroxetine, fluoxetine, Luvox, Trintellix,  Abilify 10 fatigue, bupropion, Cerefolin NAC, and modafinil and Nuvigil, pramipexole, remotely took Adderall and Ritalin.  Review of Systems:  Review of Systems  Constitutional: Negative for fatigue.  Respiratory: Negative for shortness of breath.   Musculoskeletal: Positive for arthralgias and myalgias.  Neurological: Negative for tremors and weakness.  Psychiatric/Behavioral: Positive for dysphoric mood. Negative for agitation, behavioral problems, confusion, decreased concentration, hallucinations,  self-injury, sleep disturbance and suicidal ideas. The patient is nervous/anxious. The patient is not hyperactive.     Medications: I have reviewed the patient's current medications.  Current Outpatient Medications  Medication Sig Dispense Refill  . ALPRAZolam (XANAX) 0.25 MG tablet Take 0.25 mg by mouth 2 (two) times daily as needed for anxiety or sleep.    Marland Kitchen aspirin 81 MG tablet Take 81 mg by mouth daily.    Marland Kitchen buPROPion (WELLBUTRIN XL) 150 MG 24 hr tablet Take 3 tablets (450 mg total) by mouth daily. 270 tablet 0  . Cholecalciferol (VITAMIN D-3) 5000 units TABS Take 5,000 Units by mouth daily.    . Coenzyme Q10 200 MG TABS Take 300 mg by mouth daily.     . CRESTOR 5 MG tablet Take 5 mg by mouth daily.  2  . desonide (DESOWEN) 0.05 % ointment   0  . econazole nitrate 1 % cream Apply topically daily.    Marland Kitchen escitalopram (LEXAPRO) 20 MG tablet Take 1 tablet (20 mg total) by mouth daily. 30 tablet 1  . lactase (LACTAID) 3000 units tablet Take 3,000 Units by mouth 3 (three) times daily with meals.    Marland Kitchen lithium carbonate 150 MG capsule Take 150 mg by mouth at bedtime.    . methylphenidate (RITALIN) 10 MG tablet Take 2 tablets (20 mg total) by mouth 2 (two) times daily with breakfast and lunch. 360 tablet 0  . mupirocin ointment (BACTROBAN) 2 %   1  . risperiDONE (RISPERDAL) 0.25 MG tablet Take 0.25 mg by mouth daily as needed (tends to brighten mood when needed).     Marland Kitchen rOPINIRole (REQUIP) 1 MG tablet TAKE 3 TABLETS (3 MG TOTAL) BY MOUTH AT BEDTIME. (Patient taking differently: 2 mg. ) 90 tablet 2   No current facility-administered medications for this visit.     Medication Side Effects: None sexual SE are better not  All gone.  Allergies:  Allergies  Allergen Reactions  . Erythromycin Hives    Past Medical History:  Diagnosis Date  . Allergy   . Anemia    iron  . Carotid artery occlusion   . Hyperlipidemia   . Obesity   . OCD (obsessive compulsive disorder)   . Periodic limb  movement disorder   . Sleep apnea     Family History  Problem Relation Age of Onset  . Cancer Mother        breast and ovarian  . Depression Father        bi-polar    Social History   Socioeconomic History  . Marital status: Married    Spouse name: Not on file  . Number of children: Not on file  . Years of education: Not on file  . Highest education level: Not on file  Occupational History  . Not on file  Social Needs  . Financial resource strain: Not on file  . Food insecurity:    Worry: Not on file    Inability: Not on file  . Transportation needs:    Medical: Not on  file    Non-medical: Not on file  Tobacco Use  . Smoking status: Never Smoker  . Smokeless tobacco: Never Used  Substance and Sexual Activity  . Alcohol use: No  . Drug use: No  . Sexual activity: Not on file  Lifestyle  . Physical activity:    Days per week: Not on file    Minutes per session: Not on file  . Stress: Not on file  Relationships  . Social connections:    Talks on phone: Not on file    Gets together: Not on file    Attends religious service: Not on file    Active member of club or organization: Not on file    Attends meetings of clubs or organizations: Not on file    Relationship status: Not on file  . Intimate partner violence:    Fear of current or ex partner: Not on file    Emotionally abused: Not on file    Physically abused: Not on file    Forced sexual activity: Not on file  Other Topics Concern  . Not on file  Social History Narrative  . Not on file    Past Medical History, Surgical history, Social history, and Family history were reviewed and updated as appropriate.   Please see review of systems for further details on the patient's review from today.   Objective:   Physical Exam:  There were no vitals taken for this visit.  Physical Exam Neurological:     Mental Status: He is alert and oriented to person, place, and time.     Cranial Nerves: No dysarthria.   Psychiatric:        Attention and Perception: Attention normal. He does not perceive auditory hallucinations.        Mood and Affect: Mood is anxious and depressed.        Speech: Speech normal.        Behavior: Behavior is cooperative.        Thought Content: Thought content is not paranoid or delusional. Thought content does not include homicidal or suicidal ideation. Thought content does not include homicidal or suicidal plan.        Cognition and Memory: Cognition and memory normal.        Judgment: Judgment normal.     Comments: Obsessing but manageable.     Lab Review:  No results found for: NA, K, CL, CO2, GLUCOSE, BUN, CREATININE, CALCIUM, PROT, ALBUMIN, AST, ALT, ALKPHOS, BILITOT, GFRNONAA, GFRAA  No results found for: WBC, RBC, HGB, HCT, PLT, MCV, MCH, MCHC, RDW, LYMPHSABS, MONOABS, EOSABS, BASOSABS  No results found for: POCLITH, LITHIUM   No results found for: PHENYTOIN, PHENOBARB, VALPROATE, CBMZ  Vitamin D level acceptable at 54.5.   Marland Kitchenres Assessment: Plan:    Mixed obsessional thoughts and acts  Major depressive disorder, recurrent episode, moderate (HCC)  Restless legs syndrome  Obstructive sleep apnea  Erectile disorder, acquired, generalized, moderate  Mr. Carmickle has a long history of depression and OCD which are partially controlled.  He believes the depression is a consequence of the residual OCD.  He has some compulsive checking and obsessions around the house maintenance.  He wishes to avoid sexual side effects and so we are keeping the SSRI at the lowest possible dose.  He is tried all of the reasonable SSRI options with the exception possibly of sertraline but it is likely to have more sexual dysfunction than what he is taking now.  When travels then tends  to have less OCD bc triggered less.  As discussed at the last appointment fortunately his anxiety has improved somewhat over time since we increased the Lexapro the last time.    Better with the  increase in the Lexapro back to 20 mg daily.   Dose cut  To 10 in March 2019.  His dep pression is improved with the Ritalin as is his productivity.  We discussed the duration of the stimulant and whether to take it once or twice a day which will be left up to him.  answered his questions about possible withdrawal headaches that he is experiencing the day after taking Ritalin.  This is uncertain I asked him to pay attention to the pattern.. We will continue the benefit by increasing methylphenidate up to 30 BID to improve health depression and concentration.  We will stick with a short acting because his insurance refuses to cover it.  No problem with the switch back to ropinirole. RLS managed.  He's stopped using Nuvigil or Provigil on prn basis for excessive sleepiness related to obstructive sleep apnea.     Discussed cognitive behavioral techniques for managing both depressive and anxiety symptoms.  Discussed his delayed sleep phase syndrome which currently is only a problem because it is bothering his wife.  Option Rexulti.   Follow-up 3 weeks  Meredith Staggers MD, DFAPA. Please see After Visit Summary for patient specific instructions.  Future Appointments  Date Time Provider Department Center  06/13/2018  9:00 AM Cottle, Steva Ready., MD [facility] None  07/03/2018  4:00 PM Cottle, Steva Ready., MD [facility] None  07/24/2018 11:00 AM Cottle, Steva Ready., MD [facility] None  08/15/2018 11:15 AM Cottle, Steva Ready., MD [facility] None    No orders of the defined types were placed in this encounter.     -------------------------------

## 2018-05-25 DIAGNOSIS — G4733 Obstructive sleep apnea (adult) (pediatric): Secondary | ICD-10-CM | POA: Diagnosis not present

## 2018-06-12 ENCOUNTER — Other Ambulatory Visit: Payer: Self-pay | Admitting: Psychiatry

## 2018-06-13 ENCOUNTER — Other Ambulatory Visit: Payer: Self-pay

## 2018-06-13 ENCOUNTER — Ambulatory Visit: Payer: No Typology Code available for payment source | Admitting: Psychiatry

## 2018-06-13 ENCOUNTER — Other Ambulatory Visit: Payer: Self-pay | Admitting: Psychiatry

## 2018-06-13 ENCOUNTER — Encounter: Payer: Self-pay | Admitting: Psychiatry

## 2018-06-13 DIAGNOSIS — G4733 Obstructive sleep apnea (adult) (pediatric): Secondary | ICD-10-CM | POA: Diagnosis not present

## 2018-06-13 DIAGNOSIS — R7989 Other specified abnormal findings of blood chemistry: Secondary | ICD-10-CM

## 2018-06-13 DIAGNOSIS — G2581 Restless legs syndrome: Secondary | ICD-10-CM

## 2018-06-13 DIAGNOSIS — F331 Major depressive disorder, recurrent, moderate: Secondary | ICD-10-CM | POA: Diagnosis not present

## 2018-06-13 DIAGNOSIS — F422 Mixed obsessional thoughts and acts: Secondary | ICD-10-CM | POA: Diagnosis not present

## 2018-06-13 DIAGNOSIS — F5221 Male erectile disorder: Secondary | ICD-10-CM

## 2018-06-13 NOTE — Progress Notes (Signed)
Caryl BisMichael R Goerner [id number] 11-Jun-1950 68 y.o.   Virtual Visit via WebEx app  I connected with pt by WebEx and verified that I am speaking with the correct person using two identifiers.   I discussed the limitations, risks, security and privacy concerns of performing an evaluation and management service by WebEx and the availability of in person appointments. I also discussed with the patient that there may be a patient responsible charge related to this service. The patient expressed understanding and agreed to proceed.  I discussed the assessment and treatment plan with the patient. The patient was provided an opportunity to ask questions and all were answered. The patient agreed with the plan and demonstrated an understanding of the instructions.   The patient was advised to call back or seek an in-person evaluation if the symptoms worsen or if the condition fails to improve as anticipated.  I provided 35 minutes of non-face-to-face time during this encounter. The call started at 9:00 and ended at 9:35 the patient was located at home and the provider was located office.   Subjective:   Patient ID:  Caryl BisMichael R Benninger is a 68 y.o. (DOB 11-Jun-1950) male.  Chief Complaint:  Chief Complaint  Patient presents with  . Follow-up    Medication Management  . Other    OCD    HPI   Caryl BisMichael R Senters presents for WebEx telemedicine today for follow-up of OCD and depression and med changes.  Last visit May 1.  We increased Ritalin to 30 BID for mood , productivity, and concentration bc some benefit at the lower dose.    He actually only increased  To at most 30 am and 20 pm.  Has to watch out for sleep.  If he takes the Ritalin too late it interferes with his sleep.  He has had somewhat erratic sleep schedule lately staying up too late which is not an uncommon pattern for him.  Then he sleeps too late in the morning.  Watching too much news.  And this is depressing and wife  notices that.  Contributes to depression. Also QT  Restrictions also somewhat depressing.  Depression is not severe.  He has seen mood benefit from the Ritalin.  It is helped his productivity and concentration to some degree.  He would like to experiment with 30 mg in the morning and 20 mg in the afternoon and try to be more consistent as he is not been very consistent with it.  Earliest OCD remembered in early 20's.    Not much interest in activity that are physical like gardening but interest Obsessing on light switches and son's salary after a lay off.  Has been able to let it go easier than normal. Drugs still interfere with sex but still takes drug holidays.  OCD not over health.  Anxiety managed without extra risperidone lately.  Pretty good and stable.  Trying to balance benefit and SE SSRI.  Increase Lexapro back to 20 mg January.  It seems to be fine.  Better with the OCD overall at the higher dose and managing the Sexual Se with drug holidays.  Anxiety is better, no panic.  Usually plenty of sleep.  Not taking Xanax daytime only HS and usually not needed. Not productive enough.  Worrying about light switches more than usual but otherwise the OCD is managed.    Per wife: still excess sleep. Mainly annoys her be he goes to sleep late and gets up late.  Average 8-9 hours.  More chipper.  No SE concerns per wife.  RLS managed ok unless stays up too late.   Not taking modafinil and trying Ritalin instead. If takes higher modafinil doesn't seem to do much more. Nuvigil better but higher BP and more sexual SE. he asks about other medications potentially that might help energy and motivation without causing sexual side effects.  Caffeine varies from none to 5 cups.  Prior psychiatric medication trials include Lexapro, citalopram NR, clomipramine, paroxetine, fluoxetine, Luvox, Trintellix,  Abilify 10 fatigue, bupropion, Cerefolin NAC, and modafinil and Nuvigil, pramipexole, remotely took  Adderall and Ritalin.  Review of Systems:  Review of Systems  Constitutional: Positive for fatigue.  Respiratory: Negative for shortness of breath.   Musculoskeletal: Positive for arthralgias and myalgias.  Neurological: Negative for tremors and weakness.  Psychiatric/Behavioral: Positive for dysphoric mood. Negative for agitation, behavioral problems, confusion, decreased concentration, hallucinations, self-injury, sleep disturbance and suicidal ideas. The patient is nervous/anxious. The patient is not hyperactive.     Medications: I have reviewed the patient's current medications.  Current Outpatient Medications  Medication Sig Dispense Refill  . ALPRAZolam (XANAX) 0.25 MG tablet Take 0.25 mg by mouth 2 (two) times daily as needed for anxiety or sleep.    Marland Kitchen aspirin 81 MG tablet Take 81 mg by mouth daily.    Marland Kitchen buPROPion (WELLBUTRIN XL) 150 MG 24 hr tablet Take 3 tablets (450 mg total) by mouth daily. 270 tablet 0  . Cholecalciferol (VITAMIN D-3) 5000 units TABS Take 5,000 Units by mouth daily.    . Coenzyme Q10 200 MG TABS Take 300 mg by mouth daily.     . CRESTOR 5 MG tablet Take 5 mg by mouth daily.  2  . desonide (DESOWEN) 0.05 % ointment   0  . econazole nitrate 1 % cream Apply topically daily.    Marland Kitchen escitalopram (LEXAPRO) 20 MG tablet Take 1 tablet (20 mg total) by mouth daily. 30 tablet 1  . lactase (LACTAID) 3000 units tablet Take 3,000 Units by mouth 3 (three) times daily with meals.    Marland Kitchen lithium carbonate 150 MG capsule Take 150 mg by mouth at bedtime.    . methylphenidate (RITALIN) 10 MG tablet Take 2 tablets (20 mg total) by mouth 2 (two) times daily with breakfast and lunch. 360 tablet 0  . mupirocin ointment (BACTROBAN) 2 %   1  . risperiDONE (RISPERDAL) 0.25 MG tablet Take 0.25 mg by mouth daily as needed (tends to brighten mood when needed).     Marland Kitchen rOPINIRole (REQUIP) 1 MG tablet TAKE 3 TABLETS (3 MG TOTAL) BY MOUTH AT BEDTIME. (Patient taking differently: 2 mg. ) 90 tablet  2   No current facility-administered medications for this visit.     Medication Side Effects: None sexual SE are better not  All gone.  Allergies:  Allergies  Allergen Reactions  . Erythromycin Hives    Past Medical History:  Diagnosis Date  . Allergy   . Anemia    iron  . Carotid artery occlusion   . Hyperlipidemia   . Obesity   . OCD (obsessive compulsive disorder)   . Periodic limb movement disorder   . Sleep apnea     Family History  Problem Relation Age of Onset  . Cancer Mother        breast and ovarian  . Depression Father        bi-polar    Social History   Socioeconomic History  . Marital status: Married  Spouse name: Not on file  . Number of children: Not on file  . Years of education: Not on file  . Highest education level: Not on file  Occupational History  . Not on file  Social Needs  . Financial resource strain: Not on file  . Food insecurity:    Worry: Not on file    Inability: Not on file  . Transportation needs:    Medical: Not on file    Non-medical: Not on file  Tobacco Use  . Smoking status: Never Smoker  . Smokeless tobacco: Never Used  Substance and Sexual Activity  . Alcohol use: No  . Drug use: No  . Sexual activity: Not on file  Lifestyle  . Physical activity:    Days per week: Not on file    Minutes per session: Not on file  . Stress: Not on file  Relationships  . Social connections:    Talks on phone: Not on file    Gets together: Not on file    Attends religious service: Not on file    Active member of club or organization: Not on file    Attends meetings of clubs or organizations: Not on file    Relationship status: Not on file  . Intimate partner violence:    Fear of current or ex partner: Not on file    Emotionally abused: Not on file    Physically abused: Not on file    Forced sexual activity: Not on file  Other Topics Concern  . Not on file  Social History Narrative  . Not on file    Past Medical  History, Surgical history, Social history, and Family history were reviewed and updated as appropriate.   Please see review of systems for further details on the patient's review from today.   Objective:   Physical Exam:  There were no vitals taken for this visit.  Physical Exam Neurological:     Mental Status: He is alert and oriented to person, place, and time.     Cranial Nerves: No dysarthria.  Psychiatric:        Attention and Perception: Attention normal. He does not perceive auditory hallucinations.        Mood and Affect: Mood is anxious and depressed.        Speech: Speech normal.        Behavior: Behavior is cooperative.        Thought Content: Thought content is not paranoid or delusional. Thought content does not include homicidal or suicidal ideation. Thought content does not include homicidal or suicidal plan.        Cognition and Memory: Cognition and memory normal.        Judgment: Judgment normal.     Comments: Obsessing but manageable.     Lab Review:  No results found for: NA, K, CL, CO2, GLUCOSE, BUN, CREATININE, CALCIUM, PROT, ALBUMIN, AST, ALT, ALKPHOS, BILITOT, GFRNONAA, GFRAA  No results found for: WBC, RBC, HGB, HCT, PLT, MCV, MCH, MCHC, RDW, LYMPHSABS, MONOABS, EOSABS, BASOSABS  No results found for: POCLITH, LITHIUM   No results found for: PHENYTOIN, PHENOBARB, VALPROATE, CBMZ  Vitamin D level acceptable at 54.5.   Marland Kitchenres Assessment: Plan:    No diagnosis found.  Mr. Chmura has a long history of depression and OCD which are partially controlled.  He believes the depression is a consequence of the residual OCD.  He has some compulsive checking and obsessions around the house maintenance.  He wishes to  avoid sexual side effects and so we are keeping the SSRI at the lowest possible dose.  He is tried all of the reasonable SSRI options with the exception possibly of sertraline but it is likely to have more sexual dysfunction than what he is taking now.   When travels then tends to have less OCD bc triggered less.  As discussed at the last appointment fortunately his anxiety has improved somewhat over time since we increased the Lexapro the last time.    Better with the increase in the Lexapro back to 20 mg daily.   Dose cut  To 10 in March 2019.  Extensive discussion His dep pression is improved with the Ritalin as is his productivity.  We had a further extensive discussion about the duration and dose of Ritalin and how it needs to be used in his case twice a day.  We discussed the risks of a crash in the afternoon if he does not take the afternoon dose but also if he takes it too late he will get insomnia and he has had that problem at times.  Our goal will be 30 mg in the morning and 20 mg in the afternoon and for him to be as consistent with it as possible.  We discussed the disadvantage of consistent use of a stimulant because especially if it is at a higher dose like this if he does not take it he will be unusually fatigued and sleepy.  He has noted that on occasion.  However he would like to try the benefit of using it consistently and if is not sufficient then we will wean it back down to a lower dosage.  He is not unusually anxious from it.  We will stick with a short acting because his insurance refuses to cover it.  Discussed potential benefits, risks, and side effects of stimulants with patient to include increased heart rate, palpitations, insomnia, increased anxiety, increased irritability, or decreased appetite.  Instructed patient to contact office if experiencing any significant tolerability issues.  No problem with the switch back to ropinirole. RLS managed.  He's stopped using Nuvigil or Provigil on prn basis for excessive sleepiness related to obstructive sleep apnea.     Discussed cognitive behavioral techniques for managing both depressive and anxiety symptoms.  Discussed his delayed sleep phase syndrome which currently is only a  problem because it is bothering his wife.  Option Rexulti.   Follow-up 3 weeks  Meredith Staggers MD, DFAPA. Please see After Visit Summary for patient specific instructions.  Future Appointments  Date Time Provider Department Center  07/03/2018  4:00 PM Cottle, Steva Ready., MD [facility] None  07/24/2018 11:00 AM Cottle, Steva Ready., MD [facility] None  08/15/2018 11:15 AM Cottle, Steva Ready., MD [facility] None    No orders of the defined types were placed in this encounter.     -------------------------------

## 2018-06-25 DIAGNOSIS — G4733 Obstructive sleep apnea (adult) (pediatric): Secondary | ICD-10-CM | POA: Diagnosis not present

## 2018-06-28 ENCOUNTER — Other Ambulatory Visit: Payer: Self-pay | Admitting: Psychiatry

## 2018-07-02 DIAGNOSIS — R7303 Prediabetes: Secondary | ICD-10-CM | POA: Diagnosis not present

## 2018-07-02 DIAGNOSIS — Z79899 Other long term (current) drug therapy: Secondary | ICD-10-CM | POA: Diagnosis not present

## 2018-07-03 ENCOUNTER — Other Ambulatory Visit: Payer: Self-pay

## 2018-07-03 ENCOUNTER — Encounter: Payer: Self-pay | Admitting: Psychiatry

## 2018-07-03 ENCOUNTER — Ambulatory Visit (INDEPENDENT_AMBULATORY_CARE_PROVIDER_SITE_OTHER): Payer: No Typology Code available for payment source | Admitting: Psychiatry

## 2018-07-03 DIAGNOSIS — R7989 Other specified abnormal findings of blood chemistry: Secondary | ICD-10-CM

## 2018-07-03 DIAGNOSIS — F331 Major depressive disorder, recurrent, moderate: Secondary | ICD-10-CM | POA: Diagnosis not present

## 2018-07-03 DIAGNOSIS — G2581 Restless legs syndrome: Secondary | ICD-10-CM

## 2018-07-03 DIAGNOSIS — G4733 Obstructive sleep apnea (adult) (pediatric): Secondary | ICD-10-CM | POA: Diagnosis not present

## 2018-07-03 DIAGNOSIS — F422 Mixed obsessional thoughts and acts: Secondary | ICD-10-CM

## 2018-07-03 DIAGNOSIS — F5221 Male erectile disorder: Secondary | ICD-10-CM

## 2018-07-03 NOTE — Progress Notes (Signed)
Eric BisMichael R Forrer [id number] 07/22/1950 68 y.o.   Virtual Visit via WebEx app  I connected with pt by WebEx and verified that I am speaking with the correct person using two identifiers.   I discussed the limitations, risks, security and privacy concerns of performing an evaluation and management service by WebEx and the availability of in person appointments. I also discussed with the patient that there may be a patient responsible charge related to this service. The patient expressed understanding and agreed to proceed.  I discussed the assessment and treatment plan with the patient. The patient was provided an opportunity to ask questions and all were answered. The patient agreed with the plan and demonstrated an understanding of the instructions.   The patient was advised to call back or seek an in-person evaluation if the symptoms worsen or if the condition fails to improve as anticipated.  I provided 35 minutes of non-face-to-face time during this encounter. The call started at 4:00 and ended at 4:35 the patient was located at home and the provider was located office.   Subjective:   Patient ID:  Eric [name] is a 68 y.o. (DOB 07/22/1950) male.  Chief Complaint:  Chief Complaint  Patient presents with  . Follow-up    Medication Management  . Other    Medication Management    HPI   Eric BisMichael R Lariccia presents for WebEx telemedicine today for follow-up of OCD and depression and med changes.  Last visit was Jun 13, 2018.  We adjusted on the Ritalin dosing with a goal of 30 mg in the morning and 20 mg in the afternoon.  He has had trouble being consistent with the afternoon dosage.  That is due to forgetfulness.  Pretty good overall.  Getting better at keeping up with Ritalin as noted.  He and wife have seen benefit. Mood better, look happier and get a little more done during the day.  Worries his lack of energy maybe related to his AVR.   Has tracked BP and it's  averaging mostly normal. SE mild HA.  No problems with sleep from it.  Sleep varies with when he goes to bed.  Tends to stay up late.    Watching too much news.  And this is depressing and wife notices that.  Contributes to depression. Also QT  Restrictions also somewhat depressing.  Depression is not severe.  He has seen mood benefit from the Ritalin.  It is helped his productivity and concentration to some degree.  He would like to experiment with 30 mg in the morning and 20 mg in the afternoon and try to be more consistent as he is not been very consistent with it.  Earliest OCD remembered in early 20's.  Still checking light switches when goes to bed but not severe anxiety around.  Will have anxiety if starts to deal with any legal issues.    Not much interest in activity that are physical like gardening but interest Obsessing on light switches and son's salary after a lay off.  Has been able to let it go easier than normal.  Drugs still interfere with sex but still takes drug holidays.  OCD not over health.  Anxiety managed without extra risperidone lately.  Pretty good and stable.  Trying to balance benefit and SE SSRI.  Increase Lexapro back to 20 mg January.  It seems to be fine.  Better with the OCD overall at the higher dose and managing the Sexual Se with drug holidays.  Anxiety is better, no panic.  Usually plenty of sleep.  Not taking Xanax daytime only HS and usually not needed. Not productive enough.  Worrying about light switches more than usual but otherwise the OCD is managed.     Per wife: still excess sleep. Mainly annoys her be he goes to sleep late and gets up late.  Average 8-9 hours.   More chipper.  No SE concerns per wife.  RLS managed ok unless stays up too late.   Not taking modafinil and trying Ritalin instead. If takes higher modafinil doesn't seem to do much more. Nuvigil better but higher BP and more sexual SE. he asks about other medications potentially that  might help energy and motivation without causing sexual side effects.  Caffeine varies from none to 5 cups.  Prior psychiatric medication trials include Lexapro, citalopram NR, clomipramine, paroxetine, fluoxetine, Luvox, Trintellix,  Abilify 10 fatigue, bupropion, Cerefolin NAC, and modafinil and Nuvigil, pramipexole, remotely took Adderall and Ritalin.  Review of Systems:  Review of Systems  Constitutional: Positive for fatigue.  Respiratory: Negative for shortness of breath.   Musculoskeletal: Positive for arthralgias and myalgias.  Neurological: Negative for tremors and weakness.  Psychiatric/Behavioral: Positive for dysphoric mood. Negative for agitation, behavioral problems, confusion, decreased concentration, hallucinations, self-injury, sleep disturbance and suicidal ideas. The patient is nervous/anxious. The patient is not hyperactive.     Medications: I have reviewed the patient's current medications.  Current Outpatient Medications  Medication Sig Dispense Refill  . ALPRAZolam (XANAX) 0.25 MG tablet Take 0.25 mg by mouth 2 (two) times daily as needed for anxiety or sleep.    Marland Kitchen aspirin 81 MG tablet Take 81 mg by mouth daily.    Marland Kitchen buPROPion (WELLBUTRIN XL) 150 MG 24 hr tablet TAKE 3 TABLETS (450 MG TOTAL) BY MOUTH DAILY. 270 tablet 0  . Cholecalciferol (VITAMIN D-3) 5000 units TABS Take 5,000 Units by mouth daily.    . Coenzyme Q10 200 MG TABS Take 300 mg by mouth daily.     . CRESTOR 5 MG tablet Take 5 mg by mouth daily.  2  . desonide (DESOWEN) 0.05 % ointment   0  . econazole nitrate 1 % cream Apply topically daily.    Marland Kitchen escitalopram (LEXAPRO) 20 MG tablet TAKE 1 TABLET BY MOUTH EVERY DAY 60 tablet 0  . lactase (LACTAID) 3000 units tablet Take 3,000 Units by mouth as needed.     . lithium carbonate 150 MG capsule Take 150 mg by mouth at bedtime.    . methylphenidate (RITALIN) 10 MG tablet Take 2 tablets (20 mg total) by mouth 2 (two) times daily with breakfast and lunch.  (Patient taking differently: Take 20 mg by mouth 2 (two) times daily with breakfast and lunch. 3 in the am 2 at 2 pm) 360 tablet 0  . mupirocin ointment (BACTROBAN) 2 %   1  . risperiDONE (RISPERDAL) 0.25 MG tablet Take 0.25 mg by mouth daily as needed (tends to brighten mood when needed).     Marland Kitchen rOPINIRole (REQUIP) 1 MG tablet TAKE 3 TABLETS (3 MG TOTAL) BY MOUTH AT BEDTIME. (Patient taking differently: Take 2 mg by mouth at bedtime. ) 180 tablet 1   No current facility-administered medications for this visit.     Medication Side Effects: None sexual SE are better not  All gone.  Allergies:  Allergies  Allergen Reactions  . Erythromycin Hives    Past Medical History:  Diagnosis Date  . Allergy   . Anemia  iron  . Carotid artery occlusion   . Hyperlipidemia   . Obesity   . OCD (obsessive compulsive disorder)   . Periodic limb movement disorder   . Sleep apnea     Family History  Problem Relation Age of Onset  . Cancer Mother        breast and ovarian  . Depression Father        bi-polar    Social History   Socioeconomic History  . Marital status: Married    Spouse name: Not on file  . Number of children: Not on file  . Years of education: Not on file  . Highest education level: Not on file  Occupational History  . Not on file  Social Needs  . Financial resource strain: Not on file  . Food insecurity    Worry: Not on file    Inability: Not on file  . Transportation needs    Medical: Not on file    Non-medical: Not on file  Tobacco Use  . Smoking status: Never Smoker  . Smokeless tobacco: Never Used  Substance and Sexual Activity  . Alcohol use: No  . Drug use: No  . Sexual activity: Not on file  Lifestyle  . Physical activity    Days per week: Not on file    Minutes per session: Not on file  . Stress: Not on file  Relationships  . Social Musicianconnections    Talks on phone: Not on file    Gets together: Not on file    Attends religious service: Not on  file    Active member of club or organization: Not on file    Attends meetings of clubs or organizations: Not on file    Relationship status: Not on file  . Intimate partner violence    Fear of current or ex partner: Not on file    Emotionally abused: Not on file    Physically abused: Not on file    Forced sexual activity: Not on file  Other Topics Concern  . Not on file  Social History Narrative  . Not on file    Past Medical History, Surgical history, Social history, and Family history were reviewed and updated as appropriate.   Please see review of systems for further details on the patient's review from today.   Objective:   Physical Exam:  There were no vitals taken for this visit.  Physical Exam Neurological:     Mental Status: He is alert and oriented to person, place, and time.     Cranial Nerves: No dysarthria.  Psychiatric:        Attention and Perception: Attention normal. He does not perceive auditory hallucinations.        Mood and Affect: Mood is anxious and depressed.        Speech: Speech normal.        Behavior: Behavior is cooperative.        Thought Content: Thought content is not paranoid or delusional. Thought content does not include homicidal or suicidal ideation. Thought content does not include homicidal or suicidal plan.        Cognition and Memory: Cognition and memory normal.        Judgment: Judgment normal.     Comments: Obsessing but manageable.     Lab Review:  No results found for: NA, K, CL, CO2, GLUCOSE, BUN, CREATININE, CALCIUM, PROT, ALBUMIN, AST, ALT, ALKPHOS, BILITOT, GFRNONAA, GFRAA  No results found for: WBC, RBC, HGB,  HCT, PLT, MCV, MCH, MCHC, RDW, LYMPHSABS, MONOABS, EOSABS, BASOSABS  No results found for: POCLITH, LITHIUM   No results found for: PHENYTOIN, PHENOBARB, VALPROATE, CBMZ  Vitamin D level acceptable at 54.5.   Marland Kitchen.res Assessment: Plan:      Obsessive-compulsive disorder Major depression recurrent moderate in  partial remission Restless leg syndrome Obstructive sleep apnea Erectile dysfunction Low serum vitamin D   Eric [name] has a long history of depression and OCD which are partially controlled.  He believes the depression is a consequence of the residual OCD.  He has some compulsive checking and obsessions around the house maintenance.  He wishes to avoid sexual side effects and so we are keeping the SSRI at the lowest possible dose.  He is tried all of the reasonable SSRI options with the exception possibly of sertraline but it is likely to have more sexual dysfunction than what he is taking now.  When travels then tends to have less OCD bc triggered less.  As discussed at the last appointment fortunately his anxiety has improved somewhat over time since we increased the Lexapro the last time.    Better with the increase in the Lexapro back to 20 mg daily.   Dose cut  To 10 in March 2019.  Extensive discussion His dep pression is improved with the Ritalin as is his productivity.  We had a further extensive discussion about the duration and dose of Ritalin and how it needs to be used in his case twice a day.  We discussed the risks of a crash in the afternoon if he does not take the afternoon dose but also if he takes it too late he will get insomnia and he has had that problem at times.  Our goal will be 30 mg in the morning and 20 mg in the afternoon and for him to be as consistent with it as possible.  We discussed the disadvantage of consistent use of a stimulant because especially if it is at a higher dose like this if he does not take it he will be unusually fatigued and sleepy.  He has noted that on occasion.  However he would like to try the benefit of using it consistently and if is not sufficient then we will wean it back down to a lower dosage.  He is not unusually anxious from it.  We will stick with a short acting because his insurance refuses to cover it.  Discussed potential benefits,  risks, and side effects of stimulants with patient to include increased heart rate, palpitations, insomnia, increased anxiety, increased irritability, or decreased appetite.  Instructed patient to contact office if experiencing any significant tolerability issues.  No problem with the switch back to ropinirole. RLS managed.  He's stopped using Nuvigil or Provigil on prn basis for excessive sleepiness related to obstructive sleep apnea.     Discussed cognitive behavioral techniques for managing both depressive and anxiety symptoms.  Discussed his delayed sleep phase syndrome which currently is only a problem because it is bothering his wife.  Option Rexulti.   No med changes today.  Give methylphenidate longer to work for depressive symptoms.  Follow-up 3 weeks  Meredith Staggersarey Cottle MD, DFAPA. Please see After Visit Summary for patient specific instructions.  Future Appointments  Date Time Provider Department Center  07/24/2018 11:00 AM Cottle, Steva Readyarey G Jr., MD [facility] None  08/15/2018 11:15 AM Cottle, Steva Readyarey G Jr., MD [facility] None  09/05/2018 10:00 AM Cottle, Steva Readyarey G Jr., MD [facility] None  No orders of the defined types were placed in this encounter.     -------------------------------

## 2018-07-07 DIAGNOSIS — I351 Nonrheumatic aortic (valve) insufficiency: Secondary | ICD-10-CM | POA: Diagnosis not present

## 2018-07-07 DIAGNOSIS — R5383 Other fatigue: Secondary | ICD-10-CM | POA: Diagnosis not present

## 2018-07-24 ENCOUNTER — Ambulatory Visit (INDEPENDENT_AMBULATORY_CARE_PROVIDER_SITE_OTHER): Payer: No Typology Code available for payment source | Admitting: Psychiatry

## 2018-07-24 ENCOUNTER — Encounter: Payer: Self-pay | Admitting: Psychiatry

## 2018-07-24 ENCOUNTER — Other Ambulatory Visit: Payer: Self-pay

## 2018-07-24 DIAGNOSIS — F331 Major depressive disorder, recurrent, moderate: Secondary | ICD-10-CM | POA: Diagnosis not present

## 2018-07-24 DIAGNOSIS — G2581 Restless legs syndrome: Secondary | ICD-10-CM | POA: Diagnosis not present

## 2018-07-24 DIAGNOSIS — F422 Mixed obsessional thoughts and acts: Secondary | ICD-10-CM | POA: Diagnosis not present

## 2018-07-24 DIAGNOSIS — F5221 Male erectile disorder: Secondary | ICD-10-CM

## 2018-07-24 DIAGNOSIS — G4733 Obstructive sleep apnea (adult) (pediatric): Secondary | ICD-10-CM | POA: Diagnosis not present

## 2018-07-24 DIAGNOSIS — R7989 Other specified abnormal findings of blood chemistry: Secondary | ICD-10-CM

## 2018-07-24 NOTE — Progress Notes (Signed)
Eric BisMichael R Goldammer [id number] September 10, 1950 68 y.o.   Virtual Visit via WebEx app  I connected with pt by WebEx and verified that I am speaking with the correct person using two identifiers.   I discussed the limitations, risks, security and privacy concerns of performing an evaluation and management service by WebEx and the availability of in person appointments. I also discussed with the patient that there may be a patient responsible charge related to this service. The patient expressed understanding and agreed to proceed.  I discussed the assessment and treatment plan with the patient. The patient was provided an opportunity to ask questions and all were answered. The patient agreed with the plan and demonstrated an understanding of the instructions.   The patient was advised to call back or seek an in-person evaluation if the symptoms worsen or if the condition fails to improve as anticipated.  I provided 35 minutes of non-face-to-face time during this encounter. The call started at 11:30 and ended at [facility] the patient was located at home and the provider was located office.   Subjective:   Patient ID:  Eric [name] is a 68 y.o. (DOB September 10, 1950) male.  Chief Complaint:  Chief Complaint  Patient presents with  . Depression  . Anxiety    OCD  . Medication Management    Depression        Associated symptoms include fatigue and myalgias.  Associated symptoms include no decreased concentration and no suicidal ideas.  Past medical history includes anxiety.   Anxiety Symptoms include nervous/anxious behavior. Patient reports no confusion, decreased concentration, shortness of breath or suicidal ideas.       Eric [name] presents for WebEx telemedicine today for follow-up of OCD and depression and med changes.  Last visit was July 03, 2018.  We adjusted on the Ritalin dosing with a goal of 30 mg in the morning and 20 mg in the afternoon.  He has had trouble being  consistent with the afternoon dosage.  That is due to forgetfulness.  It is helpful.  Needed Xanax twice last week.  Anxiety at bedtime either with world or son's unemployment.  Sometimes hard to go to sleep. It helped.  Dreaming more including anxiety dreams. Admits worsening OCD over son's issues.  Has fought the compulsion that is associated with the obsessive thought.    Fairly well mood.   Getting better at keeping up with Ritalin as noted.  He and wife have seen benefit. Mood better, look happier and get a little more done during the day.  Worries his lack of energy maybe related to his AVR.   Has tracked BP and it's averaging mostly normal. SE mild HA.  [facility].  Still is forgetful and affects Ritalin use in the afternoon.  Sleep varies with when he goes to bed.  Tends to stay up late.    Watching too much news.  And this is depressing and wife notices that.  Contributes to depression. Also QT  Restrictions also somewhat depressing.  Depression is not severe.  He has seen mood benefit from the Ritalin.  It is helped his productivity and concentration to some degree.  He would like to experiment with 30 mg in the morning and 20 mg in the afternoon and try to be more consistent as he is not been very consistent with it.  Earliest OCD remembered in early 20's.  Still checking light switches when goes to  bed but not severe anxiety around.  Will have anxiety if starts to deal with any legal issues.    Not much interest in activity that are physical like gardening but interest Obsessing on light switches and son's salary after a lay off.  Has been able to let it go easier than normal.  Drugs still interfere with sex but still takes drug holidays.  OCD not over health.  Anxiety managed without extra risperidone lately.  Pretty good and stable.  Trying to balance benefit and SE SSRI.  Increase Lexapro back to 20 mg January.  It seems to be fine.  Better  with the OCD overall at the higher dose and managing the Sexual Se with drug holidays. Anxiety is better, no panic.  Usually plenty of sleep.  Not taking Xanax daytime only HS and usually not needed. Not productive enough.  Worrying about light switches more than usual but otherwise the OCD is managed.     Per wife: still excess sleep. Mainly annoys her be he goes to sleep late and gets up late.  Average 8-9 hours.    No SE concerns per wife.  RLS managed ok unless stays up too late.   Caffeine varies from none to 5 cups.  Wife will be able to teach online this fall bc of Covid.  Prior psychiatric medication trials include Lexapro, citalopram NR, clomipramine, paroxetine, fluoxetine, Luvox, Trintellix,  Abilify 10 fatigue, bupropion, Cerefolin NAC, and modafinil and Nuvigil, pramipexole, remotely took Adderall and Ritalin.  Review of Systems:  Review of Systems  Constitutional: Positive for fatigue.  Respiratory: Negative for shortness of breath.   Musculoskeletal: Positive for arthralgias and myalgias.  Neurological: Negative for tremors and weakness.  Psychiatric/Behavioral: Positive for depression and dysphoric mood. Negative for agitation, behavioral problems, confusion, decreased concentration, hallucinations, self-injury, sleep disturbance and suicidal ideas. The patient is nervous/anxious. The patient is not hyperactive.     Medications: I have reviewed the patient's current medications.  Current Outpatient Medications  Medication Sig Dispense Refill  . ALPRAZolam (XANAX) 0.25 MG tablet Take 0.25 mg by mouth 2 (two) times daily as needed for anxiety or sleep.    Marland Kitchen aspirin 81 MG tablet Take 81 mg by mouth daily.    Marland Kitchen buPROPion (WELLBUTRIN XL) 150 MG 24 hr tablet TAKE 3 TABLETS (450 MG TOTAL) BY MOUTH DAILY. 270 tablet 0  . Cholecalciferol (VITAMIN D-3) 5000 units TABS Take 5,000 Units by mouth daily.    . Coenzyme Q10 200 MG TABS Take 300 mg by mouth daily.     . CRESTOR 5 MG  tablet Take 5 mg by mouth daily.  2  . desonide (DESOWEN) 0.05 % ointment   0  . econazole nitrate 1 % cream Apply topically daily.    Marland Kitchen escitalopram (LEXAPRO) 20 MG tablet TAKE 1 TABLET BY MOUTH EVERY DAY 60 tablet 0  . lactase (LACTAID) 3000 units tablet Take 3,000 Units by mouth as needed.     . lithium carbonate 150 MG capsule Take 150 mg by mouth at bedtime.    . methylphenidate (RITALIN) 10 MG tablet Take 2 tablets (20 mg total) by mouth 2 (two) times daily with breakfast and lunch. (Patient taking differently: Take 20 mg by mouth 2 (two) times daily with breakfast and lunch. 3 in the am 2 at 2 pm) 360 tablet 0  . mupirocin ointment (BACTROBAN) 2 %   1  . rOPINIRole (REQUIP) 1 MG tablet TAKE 3 TABLETS (3 MG TOTAL) BY MOUTH  AT BEDTIME. (Patient taking differently: Take 2 mg by mouth at bedtime. ) 180 tablet 1  . risperiDONE (RISPERDAL) 0.25 MG tablet Take 0.25 mg by mouth daily as needed (tends to brighten mood when needed).      No current facility-administered medications for this visit.     Medication Side Effects: None sexual SE are better not  All gone.  Allergies:  Allergies  Allergen Reactions  . Erythromycin Hives    Past Medical History:  Diagnosis Date  . Allergy   . Anemia    iron  . Carotid artery occlusion   . Hyperlipidemia   . Obesity   . OCD (obsessive compulsive disorder)   . Periodic limb movement disorder   . Sleep apnea     Family History  Problem Relation Age of Onset  . Cancer Mother        breast and ovarian  . Depression Father        bi-polar    Social History   Socioeconomic History  . Marital status: Married    Spouse name: Not on file  . Number of children: Not on file  . Years of education: Not on file  . Highest education level: Not on file  Occupational History  . Not on file  Social Needs  . Financial resource strain: Not on file  . Food insecurity    Worry: Not on file    Inability: Not on file  . Transportation needs     Medical: Not on file    Non-medical: Not on file  Tobacco Use  . Smoking status: Never Smoker  . Smokeless tobacco: Never Used  Substance and Sexual Activity  . Alcohol use: No  . Drug use: No  . Sexual activity: Not on file  Lifestyle  . Physical activity    Days per week: Not on file    Minutes per session: Not on file  . Stress: Not on file  Relationships  . Social Musicianconnections    Talks on phone: Not on file    Gets together: Not on file    Attends religious service: Not on file    Active member of club or organization: Not on file    Attends meetings of clubs or organizations: Not on file    Relationship status: Not on file  . Intimate partner violence    Fear of current or ex partner: Not on file    Emotionally abused: Not on file    Physically abused: Not on file    Forced sexual activity: Not on file  Other Topics Concern  . Not on file  Social History Narrative  . Not on file    Past Medical History, Surgical history, Social history, and Family history were reviewed and updated as appropriate.   Please see review of systems for further details on the patient's review from today.   Objective:   Physical Exam:  There were no vitals taken for this visit.  Physical Exam Neurological:     Mental Status: He is alert and oriented to person, place, and time.     Cranial Nerves: No dysarthria.  Psychiatric:        Attention and Perception: Attention normal. He does not perceive auditory hallucinations.        Mood and Affect: Mood is anxious and depressed.        Speech: Speech normal.        Behavior: Behavior is cooperative.  Thought Content: Thought content is not paranoid or delusional. Thought content does not include homicidal or suicidal ideation. Thought content does not include homicidal or suicidal plan.        Cognition and Memory: Cognition and memory normal.        Judgment: Judgment normal.     Comments: Obsessing but manageable.     Lab  Review:  No results found for: NA, K, CL, CO2, GLUCOSE, BUN, CREATININE, CALCIUM, PROT, ALBUMIN, AST, ALT, ALKPHOS, BILITOT, GFRNONAA, GFRAA  No results found for: WBC, RBC, HGB, HCT, PLT, MCV, MCH, MCHC, RDW, LYMPHSABS, MONOABS, EOSABS, BASOSABS  No results found for: POCLITH, LITHIUM   No results found for: PHENYTOIN, PHENOBARB, VALPROATE, CBMZ  Vitamin D level acceptable at 54.5.   Marland Kitchenres Assessment: Plan:      Obsessive-compulsive disorder Major depression recurrent moderate in partial remission Restless leg syndrome Obstructive sleep apnea Erectile dysfunction Low serum vitamin D   Mr. Upperman has a long history of depression and OCD which are partially controlled.  He believes the depression is a consequence of the residual OCD.  He has some compulsive checking and obsessions around the house maintenance.  He wishes to avoid sexual side effects and so we are keeping the SSRI at the lowest possible dose.  He is tried all of the reasonable SSRI options with the exception possibly of sertraline but it is likely to have more sexual dysfunction than what he is taking now.  When travels then tends to have less OCD bc triggered less.  As discussed at the last appointment fortunately his anxiety has improved somewhat over time since we increased the Lexapro the last time.    Better with the increase in the Lexapro back to 20 mg daily.   Dose cut  To 10 in March 2019.  Extensive discussion His dep pression is improved with the Ritalin as is his productivity.  We had a further extensive discussion about the duration and dose of Ritalin and how it needs to be used in his case twice a day.  We discussed the risks of a crash in the afternoon if he does not take the afternoon dose but also if he takes it too late he will get insomnia and he has had that problem at times.  Our goal will be 30 mg in the morning and 20 mg in the afternoon and for him to be as consistent with it as possible.  We  discussed the disadvantage of consistent use of a stimulant because especially if it is at a higher dose like this if he does not take it he will be unusually fatigued and sleepy.  He has noted that on occasion.  However he would like to try the benefit of using it consistently and if is not sufficient then we will wean it back down to a lower dosage.  He is not unusually anxious from it.  We will stick with a short acting because his insurance refuses to cover it.  ERP discussed again in relation to son's situation and this has triggered his OCD.    Discussed potential benefits, risks, and side effects of stimulants with patient to include increased heart rate, palpitations, insomnia, increased anxiety, increased irritability, or decreased appetite.  Instructed patient to contact office if experiencing any significant tolerability issues.  No problem with the switch back to ropinirole. RLS managed.  He's stopped using Nuvigil or Provigil on prn basis for excessive sleepiness related to obstructive sleep apnea.  Discussed cognitive behavioral techniques for managing both depressive and anxiety symptoms.  Discussed his delayed sleep phase syndrome which currently is only a problem because it is bothering his wife.  Option Rexulti.   No med changes today.  Give methylphenidate longer to work for depressive symptoms.  Follow-up 3 weeks  Meredith Staggersarey Cottle MD, DFAPA. Please see After Visit Summary for patient specific instructions.  Future Appointments  Date Time Provider Department Center  08/15/2018 11:15 AM Cottle, Steva Readyarey G Jr., MD [facility] None  09/05/2018 10:00 AM Cottle, Steva Readyarey G Jr., MD [facility] None    No orders of the defined types were placed in this encounter.     -------------------------------

## 2018-07-25 DIAGNOSIS — G4733 Obstructive sleep apnea (adult) (pediatric): Secondary | ICD-10-CM | POA: Diagnosis not present

## 2018-07-25 MED ORDER — METHYLPHENIDATE HCL 10 MG PO TABS: ORAL_TABLET | ORAL | 0 refills | Status: DC

## 2018-07-29 ENCOUNTER — Telehealth: Payer: Self-pay

## 2018-07-29 NOTE — Telephone Encounter (Signed)
Prior authorization approved for Methylphenidate 10 mg #150 per month through [facility] effective 07/29/2018-07/28/2019

## 2018-07-29 NOTE — Telephone Encounter (Signed)
Thank you :)

## 2018-07-30 ENCOUNTER — Telehealth: Payer: Self-pay

## 2018-07-30 NOTE — Telephone Encounter (Signed)
Pt. Informed of PA approval.

## 2018-07-30 NOTE — Telephone Encounter (Signed)
-----   Message from Purnell Shoemaker., MD sent at 07/29/2018  2:42 PM EDT ----- Regarding: notify pt of PA approval for methylphenidate Albina Billet, he was asking about this.  Please let him know it's approved

## 2018-08-05 ENCOUNTER — Other Ambulatory Visit: Payer: Self-pay | Admitting: Psychiatry

## 2018-08-05 DIAGNOSIS — R5383 Other fatigue: Secondary | ICD-10-CM | POA: Diagnosis not present

## 2018-08-05 DIAGNOSIS — I351 Nonrheumatic aortic (valve) insufficiency: Secondary | ICD-10-CM | POA: Diagnosis not present

## 2018-08-14 DIAGNOSIS — G4733 Obstructive sleep apnea (adult) (pediatric): Secondary | ICD-10-CM | POA: Diagnosis not present

## 2018-08-15 ENCOUNTER — Ambulatory Visit (INDEPENDENT_AMBULATORY_CARE_PROVIDER_SITE_OTHER): Payer: No Typology Code available for payment source | Admitting: Psychiatry

## 2018-08-15 ENCOUNTER — Encounter: Payer: Self-pay | Admitting: Psychiatry

## 2018-08-15 ENCOUNTER — Other Ambulatory Visit: Payer: Self-pay

## 2018-08-15 DIAGNOSIS — F5221 Male erectile disorder: Secondary | ICD-10-CM

## 2018-08-15 DIAGNOSIS — F422 Mixed obsessional thoughts and acts: Secondary | ICD-10-CM | POA: Diagnosis not present

## 2018-08-15 DIAGNOSIS — F331 Major depressive disorder, recurrent, moderate: Secondary | ICD-10-CM

## 2018-08-15 DIAGNOSIS — G2581 Restless legs syndrome: Secondary | ICD-10-CM

## 2018-08-15 DIAGNOSIS — G4733 Obstructive sleep apnea (adult) (pediatric): Secondary | ICD-10-CM

## 2018-08-15 DIAGNOSIS — R7989 Other specified abnormal findings of blood chemistry: Secondary | ICD-10-CM

## 2018-08-15 NOTE — Progress Notes (Signed)
Eric BisMichael R Trim [id number] 31-Oct-1950 68 y.o.   Virtual Visit via WebEx app  I connected with pt by WebEx and verified that I am speaking with the correct person using two identifiers.   I discussed the limitations, risks, security and privacy concerns of performing an evaluation and management service by WebEx and the availability of in person appointments. I also discussed with the patient that there may be a patient responsible charge related to this service. The patient expressed understanding and agreed to proceed.  I discussed the assessment and treatment plan with the patient. The patient was provided an opportunity to ask questions and all were answered. The patient agreed with the plan and demonstrated an understanding of the instructions.   The patient was advised to call back or seek an in-person evaluation if the symptoms worsen or if the condition fails to improve as anticipated.  I provided 40 minutes of non-face-to-face time during this encounter. The call started at 1120 and [facility] the patient was located at home and the provider was located office.   Subjective:   Patient ID:  Eric [name] is a 68 y.o. (DOB 31-Oct-1950) male.  Chief Complaint:  Chief Complaint  Patient presents with  . Follow-up    Medication Management  . Other    OCD    Depression        Associated symptoms include fatigue and myalgias.  Associated symptoms include no decreased concentration and no suicidal ideas.  Past medical history includes anxiety.   Anxiety Symptoms include nervous/anxious behavior. Patient reports no confusion, decreased concentration, shortness of breath or suicidal ideas.       [name] Awadallah presents for WebEx telemedicine today for follow-up of OCD and depression and med changes.  Last Visit July 2.  No med changes.    Overall pretty well.  Keep having trouble remembering [facility].  Lasts longer than 4 hours.  Notices benefit from it.  Other  doctors OK it too.  Background anxiety is a bit worse bc son Eric [name]'s job struggles.  No panic.  Still checks light switches so OCD is still about the same.  Wife is on board on the decision about the security system.    Needed Xanax twice last week.  Anxiety at bedtime either with world or son's unemployment.  Sleep is good.   It helped.  Dreaming more including anxiety dreams. Admits worsening OCD over son's issues.  Has fought the compulsion that is associated with the obsessive thought.    Energy poor but can't exercise DT ortho problems.  Plans to eventually start walking again.  Doesn't feel he's motivated enough to get paperwork completed.  Fairly well mood.   Getting better at keeping up with Ritalin as noted.  He and wife have seen benefit. Mood better, look happier and get a little more done during the day.  Has tracked BP and it's averaging mostly normal. SE mild HA.  [facility].  Still is forgetful and affects Ritalin use in the afternoon.  Sleep varies with when he goes to bed.  Tends to stay up late.    Watching too much news.  And this is depressing and wife notices that.  Contributes to depression. Also QT  Restrictions also somewhat depressing.  Depression is not severe.  He has seen mood benefit from the Ritalin.  It is helped his productivity and concentration to some degree.  He would like to  experiment with 30 mg in the morning and 20 mg in the afternoon and try to be more consistent as he is not been very consistent with it.  Earliest OCD remembered in early 20's.  Still checking light switches when goes to bed but not severe anxiety around.  Will have anxiety if starts to deal with any legal issues.    Not much interest in activity that are physical like gardening but interest Obsessing on light switches and son's salary after a lay off.  Has been able to let it go easier than normal.  Drugs still interfere with sex but still takes  drug holidays.  OCD not over health.  Anxiety managed without extra risperidone lately.  Pretty good and stable.  Trying to balance benefit and SE SSRI.  Increase Lexapro back to 20 mg January.  It seems to be fine.  Better with the OCD overall at the higher dose and managing the Sexual Se with drug holidays. Anxiety is better, no panic.  Usually plenty of sleep.  Not taking Xanax daytime only HS and usually not needed. Not productive enough.  Worrying about light switches more than usual but otherwise the OCD is managed.     Per wife: still excess sleep. Mainly annoys her be he goes to sleep late and gets up late.  Average 8-9 hours.    No SE concerns per wife.  RLS managed ok unless stays up too late.   Caffeine varies from none to 5 cups.  Wife will be able to teach online this fall bc of Covid.  Prior psychiatric medication trials include Lexapro, citalopram NR, clomipramine, paroxetine, fluoxetine, Luvox, Trintellix,  Abilify 10 fatigue, bupropion, Cerefolin NAC, and modafinil and Nuvigil, pramipexole, remotely took Adderall and Ritalin.  Review of Systems:  Review of Systems  Constitutional: Positive for fatigue.  Respiratory: Negative for shortness of breath.   Musculoskeletal: Positive for arthralgias and myalgias.  Neurological: Negative for tremors and weakness.  Psychiatric/Behavioral: Positive for depression and dysphoric mood. Negative for agitation, behavioral problems, confusion, decreased concentration, hallucinations, self-injury, sleep disturbance and suicidal ideas. The patient is nervous/anxious. The patient is not hyperactive.     Medications: I have reviewed the patient's current medications.  Current Outpatient Medications  Medication Sig Dispense Refill  . ALPRAZolam (XANAX) 0.25 MG tablet Take 0.25 mg by mouth 2 (two) times daily as needed for anxiety or sleep.    Marland Kitchen. aspirin 81 MG tablet Take 81 mg by mouth daily.    Marland Kitchen. buPROPion (WELLBUTRIN XL) 150 MG 24 hr  tablet TAKE 3 TABLETS (450 MG TOTAL) BY MOUTH DAILY. 270 tablet 0  . Cholecalciferol (VITAMIN D-3) 5000 units TABS Take 5,000 Units by mouth daily.    . Coenzyme Q10 200 MG TABS Take 300 mg by mouth daily.     . CRESTOR 5 MG tablet Take 5 mg by mouth daily.  2  . desonide (DESOWEN) 0.05 % ointment   0  . econazole nitrate 1 % cream Apply topically daily.    Marland Kitchen. escitalopram (LEXAPRO) 20 MG tablet TAKE 1 TABLET BY MOUTH EVERY DAY 60 tablet 0  . lactase (LACTAID) 3000 units tablet Take 3,000 Units by mouth as needed.     . lithium carbonate 150 MG capsule Take 150 mg by mouth at bedtime.    . methylphenidate (RITALIN) 10 MG tablet 3 tablet in the morning and 2 at noon 150 tablet 0  . mupirocin ointment (BACTROBAN) 2 %   1  . risperiDONE (  RISPERDAL) 0.25 MG tablet Take 0.25 mg by mouth daily as needed (tends to brighten mood when needed).     Marland Kitchen. rOPINIRole (REQUIP) 1 MG tablet TAKE 3 TABLETS (3 MG TOTAL) BY MOUTH AT BEDTIME. (Patient taking differently: Take 2 mg by mouth at bedtime. ) 180 tablet 1   No current facility-administered medications for this visit.     Medication Side Effects: None sexual SE are better not  All gone.  Allergies:  Allergies  Allergen Reactions  . Erythromycin Hives    Past Medical History:  Diagnosis Date  . Allergy   . Anemia    iron  . Carotid artery occlusion   . Hyperlipidemia   . Obesity   . OCD (obsessive compulsive disorder)   . Periodic limb movement disorder   . Sleep apnea     Family History  Problem Relation Age of Onset  . Cancer Mother        breast and ovarian  . Depression Father        bi-polar    Social History   Socioeconomic History  . Marital status: Married    Spouse name: Not on file  . Number of children: Not on file  . Years of education: Not on file  . Highest education level: Not on file  Occupational History  . Not on file  Social Needs  . Financial resource strain: Not on file  . Food insecurity    Worry: Not  on file    Inability: Not on file  . Transportation needs    Medical: Not on file    Non-medical: Not on file  Tobacco Use  . Smoking status: Never Smoker  . Smokeless tobacco: Never Used  Substance and Sexual Activity  . Alcohol use: No  . Drug use: No  . Sexual activity: Not on file  Lifestyle  . Physical activity    Days per week: Not on file    Minutes per session: Not on file  . Stress: Not on file  Relationships  . Social Musicianconnections    Talks on phone: Not on file    Gets together: Not on file    Attends religious service: Not on file    Active member of club or organization: Not on file    Attends meetings of clubs or organizations: Not on file    Relationship status: Not on file  . Intimate partner violence    Fear of current or ex partner: Not on file    Emotionally abused: Not on file    Physically abused: Not on file    Forced sexual activity: Not on file  Other Topics Concern  . Not on file  Social History Narrative  . Not on file    Past Medical History, Surgical history, Social history, and Family history were reviewed and updated as appropriate.   Please see review of systems for further details on the patient's review from today.   Objective:   Physical Exam:  There were no vitals taken for this visit.  Physical Exam Neurological:     Mental Status: He is alert and oriented to person, place, and time.     Cranial Nerves: No dysarthria.  Psychiatric:        Attention and Perception: Attention normal. He does not perceive auditory hallucinations.        Mood and Affect: Mood is anxious and depressed.        Speech: Speech normal.  Behavior: Behavior is cooperative.        Thought Content: Thought content is not paranoid or delusional. Thought content does not include homicidal or suicidal ideation. Thought content does not include homicidal or suicidal plan.        Cognition and Memory: Cognition and memory normal.        Judgment: Judgment  normal.     Comments: Obsessing but manageable.     Lab Review:  No results found for: NA, K, CL, CO2, GLUCOSE, BUN, CREATININE, CALCIUM, PROT, ALBUMIN, AST, ALT, ALKPHOS, BILITOT, GFRNONAA, GFRAA  No results found for: WBC, RBC, HGB, HCT, PLT, MCV, MCH, MCHC, RDW, LYMPHSABS, MONOABS, EOSABS, BASOSABS  No results found for: POCLITH, LITHIUM   No results found for: PHENYTOIN, PHENOBARB, VALPROATE, CBMZ  Vitamin D level acceptable at 54.5.    Echocardiogram is stable re: AVR over the last 8 years and not likely the cause of lethargy.  .res Assessment: Plan:    Obsessive-compulsive disorder Major depression recurrent moderate in partial remission Restless leg syndrome Obstructive sleep apnea Erectile dysfunction Low serum vitamin D  Mr. Nole has a long history of depression and OCD which are partially controlled.  He believes the depression is a consequence of the residual OCD.  He has some compulsive checking and obsessions around the house maintenance.  He wishes to avoid sexual side effects and so we are keeping the SSRI at the lowest possible dose.  He is tried all of the reasonable SSRI options with the exception possibly of sertraline but it is likely to have more sexual dysfunction than what he is taking now.  When travels then tends to have less OCD bc triggered less.  As discussed  fortunately his anxiety has improved somewhat over time since we increased the Lexapro the last time.    Better with the increase in the Lexapro back to 20 mg daily.   Dose cut  To 10 in March 2019.  Extensive discussion His depression is improved with the Ritalin as is his productivity.  We had a further extensive discussion about the duration and dose of Ritalin and how it needs to be used in his case twice a day.  We discussed the risks of a crash in the afternoon if he does not take the afternoon dose but also if he takes it too late he will get insomnia and he has had that problem at times.   He's satisfied with 30 mg in the morning and No afternoon dosage.  It has been helpful.  and for him to be as consistent with it as possible.    He is not unusually anxious from it.  Discussed the option of switching to long-acting methylphenidate now that his insurance is agreed to cover it. Discussed potential benefits, risks, and side effects of stimulants with patient to include increased heart rate, palpitations, insomnia, increased anxiety, increased irritability, or decreased appetite.  Instructed patient to contact office if experiencing any significant tolerability issues. ERP discussed again in relation to son's situation and this has triggered his OCD.    Disc screening tools for cognitive deficit.  Disc options.  He'll try getting wife to administer MMSE.  Discussed the options of neuropsychological testing  No problem with the switch back to ropinirole. RLS managed.  Discussed cognitive behavioral techniques for managing both depressive and anxiety symptoms.  Discussed his delayed sleep phase syndrome which currently is only a problem because it is bothering his wife.  Option Rexulti.   No  med changes today.  Give methylphenidate longer to work for depressive symptoms.  He wants to temporarily stop lithium for 3 weeks and see it it's contributing to lethargy.  Follow-up 3 weeks  Meredith Staggers MD, DFAPA. Please see After Visit Summary for patient specific instructions.  Future Appointments  Date Time Provider Department Center  09/05/2018 10:00 AM Cottle, Steva Ready., MD [facility] None  09/26/2018 10:00 AM Cottle, Steva Ready., MD [facility] None  10/17/2018  9:00 AM Cottle, Steva Ready., MD [facility] None    No orders of the defined types were placed in this encounter.     -------------------------------

## 2018-08-25 DIAGNOSIS — G4733 Obstructive sleep apnea (adult) (pediatric): Secondary | ICD-10-CM | POA: Diagnosis not present

## 2018-09-05 ENCOUNTER — Ambulatory Visit (INDEPENDENT_AMBULATORY_CARE_PROVIDER_SITE_OTHER): Payer: No Typology Code available for payment source | Admitting: Psychiatry

## 2018-09-05 ENCOUNTER — Encounter: Payer: Self-pay | Admitting: Psychiatry

## 2018-09-05 ENCOUNTER — Other Ambulatory Visit: Payer: Self-pay

## 2018-09-05 DIAGNOSIS — F422 Mixed obsessional thoughts and acts: Secondary | ICD-10-CM

## 2018-09-05 DIAGNOSIS — G2581 Restless legs syndrome: Secondary | ICD-10-CM

## 2018-09-05 DIAGNOSIS — F331 Major depressive disorder, recurrent, moderate: Secondary | ICD-10-CM | POA: Diagnosis not present

## 2018-09-05 DIAGNOSIS — F5221 Male erectile disorder: Secondary | ICD-10-CM | POA: Diagnosis not present

## 2018-09-05 DIAGNOSIS — R7989 Other specified abnormal findings of blood chemistry: Secondary | ICD-10-CM

## 2018-09-05 DIAGNOSIS — G4733 Obstructive sleep apnea (adult) (pediatric): Secondary | ICD-10-CM

## 2018-09-05 NOTE — Progress Notes (Signed)
Eric BisMichael R Pyles [id number] September 11, 1950 68 y.o.   Virtual Visit via WebEx app  I connected with pt by WebEx and verified that I am speaking with the correct person using two identifiers.   I discussed the limitations, risks, security and privacy concerns of performing an evaluation and management service by WebEx and the availability of in person appointments. I also discussed with the patient that there may be a patient responsible charge related to this service. The patient expressed understanding and agreed to proceed.  I discussed the assessment and treatment plan with the patient. The patient was provided an opportunity to ask questions and all were answered. The patient agreed with the plan and demonstrated an understanding of the instructions.   The patient was advised to call back or seek an in-person evaluation if the symptoms worsen or if the condition fails to improve as anticipated.  I provided 40 minutes of non-face-to-face time during this encounter. The call started at 1030 and ended at 1110 the patient was located at home and the provider was located office.   Subjective:   Patient ID:  Eric [name] is a 68 y.o. (DOB September 11, 1950) male.  Chief Complaint:  Chief Complaint  Patient presents with  . Follow-up    psych med managment  . Depression  . Anxiety    Depression        Associated symptoms include fatigue and myalgias.  Associated symptoms include no decreased concentration and no suicidal ideas.  Past medical history includes anxiety.   Anxiety Symptoms include nervous/anxious behavior. Patient reports no confusion, decreased concentration, shortness of breath or suicidal ideas.       Eric [name] presents for WebEx telemedicine today for follow-up of OCD and depression and med changes.  Last Visit July 24.  No med changes except stopped lithium to see if energy is better.    Can't tell about energy bc sleep more disrupted so wants to  try off the lithium longer.  He also reduced the Lexapro about the same to 10 mg from 20 mg for the purpose of less sexual SE.    Occ SI well tempered by experience with his brother so wouldn't do it.  Still residual depression in part related to chronic OCD.  Most sig health problem is weight of #287.  Asks if Lexapro is contributing.    Overall pretty well.  Keep having trouble remembering [facility].  Lasts longer than 4 hours.  Notices benefit from it.  Other doctors OK it too.   Background anxiety is a bit worse bc son [name]'s job struggles.  No panic.  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  Wife is on board on the decision about the security system.    Needed Xanax a lttle more DT sleep irregularity bc uses it occ for ins and only once for anxiety.  Anxiety at bedtime either with world or son's unemployment.  Sleep is good.   It helped.  Dreaming more including anxiety dreams. Admits worsening OCD over son's issues.  Has fought the compulsion that is associated with the obsessive thought.    Energy poor but can't exercise DT ortho problems.  Plans to eventually start walking again.  Doesn't feel he's motivated enough to get paperwork completed.  Fairly well mood.   Getting better at keeping up with Ritalin as noted.  He  and wife have seen benefit. Mood better, look happier and get a little more done during the day.  Has tracked BP and it's averaging mostly normal. SE mild HA.  D still talking about [facility] [place] [facility] which pisses him off.  Still is forgetful and affects Ritalin use in the afternoon.  Sleep varies with when he goes to bed.  Tends to stay up late.    Watching too much news.  And this is depressing and wife notices that.  Contributes to depression. Also QT  Restrictions also somewhat depressing.  Depression is not severe.  He has seen mood benefit from the Ritalin.  It is  helped his productivity and concentration to some degree.  Earliest OCD remembered in early 20's.  Still checking light switches when goes to bed but not severe anxiety around.  Will have anxiety if starts to deal with any legal issues.    Not much interest in activity that are physical like gardening but interest Obsessing on light switches and son's salary after a lay off.  Has been able to let it go easier than normal.  Drugs still interfere with sex but still takes drug holidays.  OCD not over health.  Anxiety managed without extra risperidone lately.  Pretty good and stable.  Trying to balance benefit and SE SSRI.  Increase Lexapro back to 20 mg January.  This gotten tired of the sexual side effects and reduced the Lexapro on his own to 10 mg between 2 and 3 weeks ago. Anxiety is better, no panic.  Usually plenty of sleep.  Not taking Xanax daytime only HS and usually not needed. Not productive enough.  Worrying about light switches more than usual but otherwise the OCD is managed.     RLS managed ok unless stays up too late.   Caffeine varies from none to 5 cups.  Wife will be able to teach online this fall bc of Covid.  Prior psychiatric medication trials include Lexapro, citalopram NR, clomipramine weight gain, paroxetine, fluoxetine, Luvox, Trintellix,  Abilify 10 fatigue, bupropion, Cerefolin NAC, and modafinil and Nuvigil, pramipexole, remotely took Adderall and Ritalin.  Review of Systems:  Review of Systems  Constitutional: Positive for fatigue.  Respiratory: Negative for shortness of breath.   Musculoskeletal: Positive for arthralgias and myalgias.  Neurological: Negative for tremors and weakness.  Psychiatric/Behavioral: Positive for depression and dysphoric mood. Negative for agitation, behavioral problems, confusion, decreased concentration, hallucinations, self-injury, sleep disturbance and suicidal ideas. The patient is nervous/anxious. The patient is not hyperactive.      Medications: I have reviewed the patient's current medications.  Current Outpatient Medications  Medication Sig Dispense Refill  . ALPRAZolam (XANAX) 0.25 MG tablet Take 0.25 mg by mouth 2 (two) times daily as needed for anxiety or sleep.    Marland Kitchen. aspirin 81 MG tablet Take 81 mg by mouth daily.    Marland Kitchen. buPROPion (WELLBUTRIN XL) 150 MG 24 hr tablet TAKE 3 TABLETS (450 MG TOTAL) BY MOUTH DAILY. 270 tablet 0  . Cholecalciferol (VITAMIN D-3) 5000 units TABS Take 5,000 Units by mouth daily.    . Coenzyme Q10 200 MG TABS Take 300 mg by mouth daily.     . CRESTOR 5 MG tablet Take 5 mg by mouth daily.  2  . desonide (DESOWEN) 0.05 % ointment   0  . methylphenidate (RITALIN) 10 MG tablet 3 tablet in the morning and 2 at noon 150 tablet 0  . risperiDONE (RISPERDAL) 0.25 MG tablet Take 0.25 mg by  mouth daily as needed (tends to brighten mood when needed).     Marland Kitchen. rOPINIRole (REQUIP) 1 MG tablet TAKE 3 TABLETS (3 MG TOTAL) BY MOUTH AT BEDTIME. (Patient taking differently: Take 2 mg by mouth at bedtime. ) 180 tablet 1  . econazole nitrate 1 % cream Apply topically daily.    Marland Kitchen. escitalopram (LEXAPRO) 20 MG tablet TAKE 1 TABLET BY MOUTH EVERY DAY (Patient taking differently: Take 10 mg by mouth daily. ) 60 tablet 0  . lactase (LACTAID) 3000 units tablet Take 3,000 Units by mouth as needed.     . lithium carbonate 150 MG capsule Take 150 mg by mouth at bedtime.    . mupirocin ointment (BACTROBAN) 2 %   1   No current facility-administered medications for this visit.     Medication Side Effects: None sexual SE are better not  All gone.  Allergies:  Allergies  Allergen Reactions  . Erythromycin Hives    Past Medical History:  Diagnosis Date  . Allergy   . Anemia    iron  . Carotid artery occlusion   . Hyperlipidemia   . Obesity   . OCD (obsessive compulsive disorder)   . Periodic limb movement disorder   . Sleep apnea     Family History  Problem Relation Age of Onset  . Cancer Mother         breast and ovarian  . Depression Father        bi-polar    Social History   Socioeconomic History  . Marital status: Married    Spouse name: Not on file  . Number of children: Not on file  . Years of education: Not on file  . Highest education level: Not on file  Occupational History  . Not on file  Social Needs  . Financial resource strain: Not on file  . Food insecurity    Worry: Not on file    Inability: Not on file  . Transportation needs    Medical: Not on file    Non-medical: Not on file  Tobacco Use  . Smoking status: Never Smoker  . Smokeless tobacco: Never Used  Substance and Sexual Activity  . Alcohol use: No  . Drug use: No  . Sexual activity: Not on file  Lifestyle  . Physical activity    Days per week: Not on file    Minutes per session: Not on file  . Stress: Not on file  Relationships  . Social Musicianconnections    Talks on phone: Not on file    Gets together: Not on file    Attends religious service: Not on file    Active member of club or organization: Not on file    Attends meetings of clubs or organizations: Not on file    Relationship status: Not on file  . Intimate partner violence    Fear of current or ex partner: Not on file    Emotionally abused: Not on file    Physically abused: Not on file    Forced sexual activity: Not on file  Other Topics Concern  . Not on file  Social History Narrative  . Not on file    Past Medical History, Surgical history, Social history, and Family history were reviewed and updated as appropriate.   Please see review of systems for further details on the patient's review from today.   Objective:   Physical Exam:  There were no vitals taken for this visit.  Physical Exam Neurological:  Mental Status: He is alert and oriented to person, place, and time.     Cranial Nerves: No dysarthria.  Psychiatric:        Attention and Perception: Attention normal. He does not perceive auditory hallucinations.         Mood and Affect: Mood is anxious and depressed.        Speech: Speech normal.        Behavior: Behavior is cooperative.        Thought Content: Thought content is not paranoid or delusional. Thought content does not include homicidal or suicidal ideation. Thought content does not include homicidal or suicidal plan.        Cognition and Memory: Cognition and memory normal.        Judgment: Judgment normal.     Comments: Obsessing but manageable.     Lab Review:  No results found for: NA, K, CL, CO2, GLUCOSE, BUN, CREATININE, CALCIUM, PROT, ALBUMIN, AST, ALT, ALKPHOS, BILITOT, GFRNONAA, GFRAA  No results found for: WBC, RBC, HGB, HCT, PLT, MCV, MCH, MCHC, RDW, LYMPHSABS, MONOABS, EOSABS, BASOSABS  No results found for: POCLITH, LITHIUM   No results found for: PHENYTOIN, PHENOBARB, VALPROATE, CBMZ  Vitamin D level acceptable at 54.5.    Echocardiogram is stable re: AVR over the last 8 years and not likely the cause of lethargy.  .res Assessment: Plan:    Diaz was seen today for follow-up, depression and anxiety.  Diagnoses and all orders for this visit:  Mixed obsessional thoughts and acts  Major depressive disorder, recurrent episode, moderate (HCC)  Restless legs syndrome  Erectile disorder, acquired, generalized, moderate  Obstructive sleep apnea  Low vitamin D level   Eric [name] has a long history of depression and OCD which are partially controlled.  He believes the depression is a consequence of the residual OCD.  He has some compulsive checking and obsessions around the house maintenance.  He wishes to avoid sexual side effects and so we are keeping the SSRI at the lowest possible dose.  He is tried all of the reasonable SSRI options with the exception possibly of sertraline but it is likely to have more sexual dysfunction than what he is taking now.  When travels then tends to have less OCD bc triggered less.  As discussed  fortunately his anxiety has improved  somewhat over time since we increased the Lexapro the last time.    Better sexual SE with the decrease in the Lexapro.  Disc risk worsening OCD.    Encourage walking and disc dieting for wieght loss.  Disc need for weight loss. Current #287.   Di  Supportive therapy and dealing with some of his questions about his reduction in religiosity associated with aging or perhaps unrelated.  Disc screening tools for cognitive deficit.  Disc options.  Consider MMSE at next visit that occurs in person.  Discussed the options of neuropsychological testing  No problem with the switch back to ropinirole. RLS managed.  Discussed cognitive behavioral techniques for managing both depressive and anxiety symptoms.  Discussed his delayed sleep phase syndrome which currently is only a problem because it is bothering his wife.  Option Rexulti.   No med changes today.  Needs a longer to adjust to the reduction in Lexapro and discontinuing the lithium.  He wants to temporarily stop lithium for 3 more weeks and see it it's contributing to lethargy.  Follow-up 3 weeks  Meredith Staggers MD, DFAPA. Please see After Visit Summary for patient  specific instructions.  Future Appointments  Date Time Provider [place]  09/26/2018 10:00 AM Cottle, Billey Co., MD [facility] None  10/17/2018  9:00 AM Cottle, Billey Co., MD [facility] None    No orders of the defined types were placed in this encounter.     -------------------------------

## 2018-09-25 DIAGNOSIS — G4733 Obstructive sleep apnea (adult) (pediatric): Secondary | ICD-10-CM | POA: Diagnosis not present

## 2018-09-26 ENCOUNTER — Other Ambulatory Visit: Payer: Self-pay

## 2018-09-26 ENCOUNTER — Encounter: Payer: Self-pay | Admitting: Psychiatry

## 2018-09-26 ENCOUNTER — Ambulatory Visit (INDEPENDENT_AMBULATORY_CARE_PROVIDER_SITE_OTHER): Payer: No Typology Code available for payment source | Admitting: Psychiatry

## 2018-09-26 VITALS — BP 152/79

## 2018-09-26 DIAGNOSIS — F422 Mixed obsessional thoughts and acts: Secondary | ICD-10-CM

## 2018-09-26 DIAGNOSIS — F331 Major depressive disorder, recurrent, moderate: Secondary | ICD-10-CM

## 2018-09-26 DIAGNOSIS — G2581 Restless legs syndrome: Secondary | ICD-10-CM

## 2018-09-26 DIAGNOSIS — N529 Male erectile dysfunction, unspecified: Secondary | ICD-10-CM | POA: Diagnosis not present

## 2018-09-26 NOTE — Progress Notes (Signed)
Eric [name] Auth [id number] September 22, 1950 68 y.o.   Virtual Visit via WebEx app  I connected with pt by WebEx and verified that I am speaking with the correct person using two identifiers.   I discussed the limitations, risks, security and privacy concerns of performing an evaluation and management service by WebEx and the availability of in person appointments. I also discussed with the patient that there may be a patient responsible charge related to this service. The patient expressed understanding and agreed to proceed.  I discussed the assessment and treatment plan with the patient. The patient was provided an opportunity to ask questions and all were answered. The patient agreed with the plan and demonstrated an understanding of the instructions.   The patient was advised to call back or seek an in-person evaluation if the symptoms worsen or if the condition fails to improve as anticipated.  I provided 40 minutes of non-face-to-face time during this encounter. The call started at 1000 and ended at 1040 the patient was located at home and the provider was located office.   Subjective:   Patient ID:  Eric [name] Swallows is a 68 y.o. (DOB September 22, 1950) male.  Chief Complaint:  Chief Complaint  Patient presents with  . Follow-up    Medication Management  . Depression    Medication Management  . Other    OCD  . Anxiety  . ADD    wants increase Ritalin     Depression        Associated symptoms include fatigue and myalgias.  Associated symptoms include no decreased concentration, no headaches and no suicidal ideas.  Past medical history includes anxiety.   Anxiety Symptoms include nervous/anxious behavior. Patient reports no confusion, decreased concentration, shortness of breath or suicidal ideas.      Eric ConnersMichael [name] [name] presents for WebEx telemedicine today for follow-up of OCD and depression and med changes.  Last visit 3 weeks ago.    No med changes except stopped  lithium to see if energy is better.    Fairly well except disregulated sleep.  Wants to increase Ritalin to 40 and 30 AM and PM bc limited benefit.  NO SE. Has BP cuff at home.  Took 30 mg 2 hours ago.   Current BP 152/79 & P67. Current coffee 3-4 cups daily.     Wife concerned about his memory.  Trying to sort out inattention vs memory problems.  Can't tell about energy bc sleep more disrupted so wants to try off the lithium longer.  He also reduced the Lexapro about the same to 10 mg from 20 mg for the purpose of less sexual SE.   After longer period with less Lexapro he's had a noticed a little more obsessing but managed.  No sig changes noticed off the lithium.  No better energy.  He'd rather not take it .  Occ SI well tempered by experience with his brother so wouldn't do it.  Still residual depression in part related to chronic OCD.  Most sig health problem is weight of #287.  Asks if Lexapro is contributing.    Overall pretty well.  Keep having trouble remembering [facility].  Lasts longer than 4 hours.  Notices benefit from it.  Other doctors OK it too.   Background anxiety is a bit worse bc son [name]'s job struggles.  No panic.  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  Wife is on board on the decision about the security system.    Needed Xanax a lttle more DT sleep irregularity bc uses it occ for ins and only once for anxiety.  Anxiety at bedtime either with world or son's unemployment.  Sleep is good.   It helped.  Dreaming more including anxiety dreams. Admits worsening OCD over son's issues.  Has fought the compulsion that is associated with the obsessive thought.    Energy poor but can't exercise DT ortho problems.  Plans to eventually start walking again.  Doesn't feel he's motivated enough to get paperwork completed.  Fairly well mood.   Getting better at keeping up  with Ritalin as noted.  He and wife have seen benefit. Mood better, look happier and get a little more done during the day.  Has tracked BP and it's averaging mostly normal. SE mild HA.   Sleep varies with when he goes to bed.  Tends to stay up late.   Then sleeps late and some days as much is 12 hours daily.  Has checked his CPAP and his AHI is no more than 2.  Worrying about light switches and counting is worse more than usual but otherwise the OCD is managed.     RLS managed ok unless stays up too late.   Caffeine varies from none to 5 cups.  Wife will be able to teach online this fall bc of Covid.  Prior psychiatric medication trials include Lexapro, citalopram NR, clomipramine weight gain, paroxetine, fluoxetine, Luvox, Trintellix,  Abilify 10 fatigue, bupropion, Cerefolin NAC, and modafinil and Nuvigil, pramipexole, remotely took Adderall and Ritalin. Increase Lexapro back to 20 mg January. Review of Systems:  Review of Systems  Constitutional: Positive for fatigue.  Respiratory: Negative for shortness of breath.   Musculoskeletal: Positive for arthralgias and myalgias.  Neurological: Negative for tremors, weakness and headaches.  Psychiatric/Behavioral: Positive for depression and dysphoric mood. Negative for agitation, behavioral problems, confusion, decreased concentration, hallucinations, self-injury, sleep disturbance and suicidal ideas. The patient is nervous/anxious. The patient is not hyperactive.     Medications: I have reviewed the patient's current medications.  Current Outpatient Medications  Medication Sig Dispense Refill  . ALPRAZolam (XANAX) 0.25 MG tablet Take 0.25 mg by mouth 2 (two) times daily as needed for anxiety or sleep.    Marland Kitchen aspirin 81 MG tablet Take 81 mg by mouth daily.    Marland Kitchen buPROPion (WELLBUTRIN XL) 150 MG 24 hr tablet TAKE 3 TABLETS (450 MG TOTAL) BY MOUTH DAILY. 270 tablet 0  . Cholecalciferol (VITAMIN D-3) 5000 units TABS Take 5,000 Units by mouth  daily.    . Coenzyme Q10 200 MG TABS Take 300 mg by mouth daily.     . CRESTOR 5 MG tablet Take 5 mg by mouth daily.  2  . desonide (DESOWEN) 0.05 % ointment   0  . econazole nitrate 1 % cream Apply topically daily.    Marland Kitchen escitalopram (LEXAPRO) 20 MG tablet TAKE 1 TABLET BY MOUTH EVERY DAY (Patient taking differently: Take 10 mg by mouth daily. ) 60 tablet 0  . lactase (LACTAID) 3000 units tablet Take 3,000 Units by mouth as needed.     . methylphenidate (RITALIN) 10 MG tablet 3 tablet in the morning and 2 at noon 150 tablet 0  . mupirocin ointment (BACTROBAN) 2 %   1  . NON FORMULARY [employer] high impact plant protein- supplement    . risperiDONE (RISPERDAL) 0.25  MG tablet Take 0.25 mg by mouth daily as needed (tends to brighten mood when needed).     Marland Kitchen rOPINIRole (REQUIP) 1 MG tablet TAKE 3 TABLETS (3 MG TOTAL) BY MOUTH AT BEDTIME. (Patient taking differently: Take 2 mg by mouth at bedtime. ) 180 tablet 1  . lithium carbonate 150 MG capsule Take 150 mg by mouth at bedtime.     No current facility-administered medications for this visit.     Medication Side Effects: None sexual SE are better not  All gone.  Allergies:  Allergies  Allergen Reactions  . Erythromycin Hives    Past Medical History:  Diagnosis Date  . Allergy   . Anemia    iron  . Carotid artery occlusion   . Hyperlipidemia   . Obesity   . OCD (obsessive compulsive disorder)   . Periodic limb movement disorder   . Sleep apnea     Family History  Problem Relation Age of Onset  . Cancer Mother        breast and ovarian  . Depression Father        bi-polar    Social History   Socioeconomic History  . Marital status: Married    Spouse name: Not on file  . Number of children: Not on file  . Years of education: Not on file  . Highest education level: Not on file  Occupational History  . Not on file  Social Needs  . Financial resource strain: Not on file  . Food insecurity    Worry: Not on file     Inability: Not on file  . Transportation needs    Medical: Not on file    Non-medical: Not on file  Tobacco Use  . Smoking status: Never Smoker  . Smokeless tobacco: Never Used  Substance and Sexual Activity  . Alcohol use: No  . Drug use: No  . Sexual activity: Not on file  Lifestyle  . Physical activity    Days per week: Not on file    Minutes per session: Not on file  . Stress: Not on file  Relationships  . Social Musician on phone: Not on file    Gets together: Not on file    Attends religious service: Not on file    Active member of club or organization: Not on file    Attends meetings of clubs or organizations: Not on file    Relationship status: Not on file  . Intimate partner violence    Fear of current or ex partner: Not on file    Emotionally abused: Not on file    Physically abused: Not on file    Forced sexual activity: Not on file  Other Topics Concern  . Not on file  Social History Narrative  . Not on file    Past Medical History, Surgical history, Social history, and Family history were reviewed and updated as appropriate.   Please see review of systems for further details on the patient's review from today.   Objective:   Physical Exam:  BP (!) 152/79   Physical Exam Neurological:     Mental Status: He is alert and oriented to person, place, and time.     Cranial Nerves: No dysarthria.     Motor: No tremor.  Psychiatric:        Attention and Perception: Attention normal. He does not perceive auditory hallucinations.        Mood and Affect: Mood is anxious  and depressed.        Speech: Speech normal.        Behavior: Behavior is cooperative.        Thought Content: Thought content is not paranoid or delusional. Thought content does not include homicidal or suicidal ideation. Thought content does not include homicidal or suicidal plan.        Cognition and Memory: Cognition and memory normal.        Judgment: Judgment normal.      Comments: Obsessing but manageable. Immed meme 3/3 Easily serial 7's.      Lab Review:  No results found for: NA, K, CL, CO2, GLUCOSE, BUN, CREATININE, CALCIUM, PROT, ALBUMIN, AST, ALT, ALKPHOS, BILITOT, GFRNONAA, GFRAA  No results found for: WBC, RBC, HGB, HCT, PLT, MCV, MCH, MCHC, RDW, LYMPHSABS, MONOABS, EOSABS, BASOSABS  No results found for: POCLITH, LITHIUM   No results found for: PHENYTOIN, PHENOBARB, VALPROATE, CBMZ  Vitamin D level acceptable at 54.5.    Echocardiogram is stable re: AVR over the last 8 years and not likely the cause of lethargy.  .res Assessment: Plan:    Casimiro NeedleMichael was seen today for follow-up, depression, other, anxiety and add.  Diagnoses and all orders for this visit:  Mixed obsessional thoughts and acts  Major depressive disorder, recurrent episode, moderate (HCC)  Inability to maintain erection  Restless legs syndrome (RLS)   Mr. Paulita Cradleendergraft has a long history of depression and OCD which are partially controlled.  He believes the depression is a consequence of the residual OCD.  He has some compulsive checking and obsessions around the house maintenance.  He wishes to avoid sexual side effects and so we are keeping the SSRI at the lowest possible dose.  He is tried all of the reasonable SSRI options with the exception possibly of sertraline but it is likely to have more sexual dysfunction than what he is taking now.  When travels then tends to have less OCD bc triggered less.  As discussed  fortunately his anxiety has improved somewhat over time since we increased the Lexapro the last time.    Overall his level of depression is certainly not severe and not even moderate.  He is able to find things that he enjoys and he does have interests.  He is probably too inactive and not as motivated as he should be which is part of his reason for wanting to increase the Ritalin.  Discussed potential benefits, risks, and side effects of stimulants with patient  to include increased heart rate, palpitations, insomnia, increased anxiety, increased irritability, or decreased appetite.  Instructed patient to contact office if experiencing any significant tolerability issues. Disc risk of increasing the Ritalin further elevating BP and pulse if we increase the Ritalin.  Need to verify that it's not markedly elevated from taking the Ritalin. Doesn't think it's been consistently elevated. Disc crash risk.  He doesn't need feel it.   Increase Ritalin to 40 mg in the AM and 30 mg PM.  Thinks memory problem relate to OCD bc often counting in the in the background at rest which tends to reduce his attention.  Better sexual SE with the decrease in the Lexapro.  Disc risk worsening OCD.  As noted he thinks is a little worse but still manageable.  Encourage walking and disc dieting for wieght loss.  Disc need for weight loss. Current #287.   Di  Disc screening tools for cognitive deficit.  Disc options.  Consider MMSE at next visit that  occurs in person.  Discussed the options of neuropsychological testing Plan to administer [facility] test in office at [facility].  No problem with the switch back to ropinirole. RLS managed.  Still no complaints.  Discussed cognitive behavioral techniques for managing both depressive and anxiety symptoms.  Discussed his delayed sleep phase syndrome which currently is only a problem because it is bothering his wife.  Option Rexulti.   No med changes today.  Needs a longer to adjust to the reduction in Lexapro and discontinuing the lithium.  He wants to remain off lithium for 3 more weeks and see it it's contributing to lethargy.  He has noted no change in energy since being off it so far.  Follow-up 3 weeks  Meredith Staggers MD, DFAPA. Please see After Visit Summary for patient specific instructions.  Future Appointments  Date Time Provider Department Center  10/17/2018  9:00 AM Cottle, Steva Ready., MD [facility] None  11/06/2018  1:30 PM  Cottle, Steva Ready., MD [facility] None  11/27/2018  1:00 PM Cottle, Steva Ready., MD [facility] None    No orders of the defined types were placed in this encounter.     -------------------------------

## 2018-09-27 ENCOUNTER — Other Ambulatory Visit: Payer: Self-pay | Admitting: Psychiatry

## 2018-10-17 ENCOUNTER — Other Ambulatory Visit: Payer: Self-pay

## 2018-10-17 ENCOUNTER — Ambulatory Visit (INDEPENDENT_AMBULATORY_CARE_PROVIDER_SITE_OTHER): Payer: No Typology Code available for payment source | Admitting: Psychiatry

## 2018-10-17 ENCOUNTER — Encounter: Payer: Self-pay | Admitting: Psychiatry

## 2018-10-17 VITALS — BP 125/72 | HR 67

## 2018-10-17 DIAGNOSIS — F331 Major depressive disorder, recurrent, moderate: Secondary | ICD-10-CM | POA: Diagnosis not present

## 2018-10-17 DIAGNOSIS — F422 Mixed obsessional thoughts and acts: Secondary | ICD-10-CM | POA: Diagnosis not present

## 2018-10-17 DIAGNOSIS — G2581 Restless legs syndrome: Secondary | ICD-10-CM | POA: Diagnosis not present

## 2018-10-17 DIAGNOSIS — G4733 Obstructive sleep apnea (adult) (pediatric): Secondary | ICD-10-CM

## 2018-10-17 DIAGNOSIS — R7989 Other specified abnormal findings of blood chemistry: Secondary | ICD-10-CM

## 2018-10-17 NOTE — Progress Notes (Signed)
Eric [name] [id number] 09/11/1950 68 y.o.     Subjective:   Patient ID:  Eric [name] is a 68 y.o. (DOB 11/29/1950) male.  Chief Complaint:  Chief Complaint  Patient presents with  . Follow-up    Medication Management  . Depression    Medication Management  . Memory Loss    Depression        Associated symptoms include fatigue and myalgias.  Associated symptoms include no decreased concentration, no headaches and no suicidal ideas.  Past medical history includes anxiety.   Anxiety Symptoms include nervous/anxious behavior. Patient reports no confusion, decreased concentration, shortness of breath or suicidal ideas.      Eric [name] presents for [facility] telemedicine today for follow-up of OCD and depression and med changes.  Last visit 3 weeks ago.    No med changes except stopped lithium to see if energy is better.  No change noted there.  Wife concerned about his memory but thinks it's attention related and sees beenfit with Ritalin.  After longer period with less Lexapro he's had a noticed a little more obsessing but managed.  Overall pretty well.  Residual depression and anxiety and OCD as noted before without worsening.  Some anxiety over anticipated cognitive screening tests today. Some chronic worry obsessively over BP though objectively no real concerns.  Sleep varies with when he goes to bed.  Tends to stay up late.   Then sleeps late and some days as much is 12 hours daily.  Has checked his CPAP and his AHI is no more than 2.  Worrying about light switches and counting is worse more than usual but otherwise the OCD is managed.     RLS managed ok unless stays up too late.   Caffeine varies from none to 5 cups.  Wife will be able to teach online this fall bc of Covid.  Prior psychiatric medication trials include Lexapro, citalopram NR, clomipramine weight gain, paroxetine, fluoxetine, Luvox, Trintellix,  Abilify 10 fatigue, bupropion,  Cerefolin NAC, and modafinil and Nuvigil, pramipexole, remotely took Adderall and Ritalin. Increase Lexapro back to 20 mg January.  Review of Systems:  Review of Systems  Constitutional: Positive for fatigue.  Respiratory: Negative for shortness of breath.   Musculoskeletal: Positive for arthralgias and myalgias.  Neurological: Negative for tremors, weakness and headaches.  Psychiatric/Behavioral: Positive for depression and dysphoric mood. Negative for agitation, behavioral problems, confusion, decreased concentration, hallucinations, self-injury, sleep disturbance and suicidal ideas. The patient is nervous/anxious. The patient is not hyperactive.     Medications: I have reviewed the patient's current medications.  Current Outpatient Medications  Medication Sig Dispense Refill  . ALPRAZolam (XANAX) 0.25 MG tablet Take 0.25 mg by mouth 2 (two) times daily as needed for anxiety or sleep.    Marland Kitchen aspirin 81 MG tablet Take 81 mg by mouth daily.    Marland Kitchen buPROPion (WELLBUTRIN XL) 150 MG 24 hr tablet TAKE 3 TABLETS (450 MG TOTAL) BY MOUTH DAILY. 270 tablet 0  . Cholecalciferol (VITAMIN D-3) 5000 units TABS Take 10,000 Units by mouth daily.     . Coenzyme Q10 200 MG TABS Take 300 mg by mouth daily.     . CRESTOR 5 MG tablet Take 5 mg by mouth daily.  2  . desonide (DESOWEN) 0.05 % ointment   0  . econazole nitrate 1 % cream Apply topically daily.    Marland Kitchen escitalopram (LEXAPRO) 20 MG tablet TAKE 1 TABLET BY MOUTH EVERY DAY (Patient taking differently: Take  10 mg by mouth daily. ) 60 tablet 0  . lactase (LACTAID) 3000 units tablet Take 3,000 Units by mouth as needed.     . lithium carbonate 150 MG capsule Take 150 mg by mouth at bedtime.    . methylphenidate (RITALIN) 10 MG tablet 3 tablet in the morning and 2 at noon (Patient taking differently: 4 tablet in the morning and 3 at noon) 150 tablet 0  . mupirocin ointment (BACTROBAN) 2 %   1  . NON FORMULARY [employer] high impact plant protein- supplement     . risperiDONE (RISPERDAL) 0.25 MG tablet Take 0.25 mg by mouth daily as needed (tends to brighten mood when needed).     Marland Kitchen rOPINIRole (REQUIP) 1 MG tablet TAKE 3 TABLETS (3 MG TOTAL) BY MOUTH AT BEDTIME. (Patient taking differently: Take 2 mg by mouth at bedtime. ) 180 tablet 1   No current facility-administered medications for this visit.     Medication Side Effects: None sexual SE are better not  All gone.  Allergies:  Allergies  Allergen Reactions  . Erythromycin Hives    Past Medical History:  Diagnosis Date  . Allergy   . Anemia    iron  . Carotid artery occlusion   . Hyperlipidemia   . Obesity   . OCD (obsessive compulsive disorder)   . Periodic limb movement disorder   . Sleep apnea     Family History  Problem Relation Age of Onset  . Cancer Mother        breast and ovarian  . Depression Father        bi-polar    Social History   Socioeconomic History  . Marital status: Married    Spouse name: Not on file  . Number of children: Not on file  . Years of education: Not on file  . Highest education level: Not on file  Occupational History  . Not on file  Social Needs  . Financial resource strain: Not on file  . Food insecurity    Worry: Not on file    Inability: Not on file  . Transportation needs    Medical: Not on file    Non-medical: Not on file  Tobacco Use  . Smoking status: Never Smoker  . Smokeless tobacco: Never Used  Substance and Sexual Activity  . Alcohol use: No  . Drug use: No  . Sexual activity: Not on file  Lifestyle  . Physical activity    Days per week: Not on file    Minutes per session: Not on file  . Stress: Not on file  Relationships  . Social Musician on phone: Not on file    Gets together: Not on file    Attends religious service: Not on file    Active member of club or organization: Not on file    Attends meetings of clubs or organizations: Not on file    Relationship status: Not on file  . Intimate  partner violence    Fear of current or ex partner: Not on file    Emotionally abused: Not on file    Physically abused: Not on file    Forced sexual activity: Not on file  Other Topics Concern  . Not on file  Social History Narrative  . Not on file    Past Medical History, Surgical history, Social history, and Family history were reviewed and updated as appropriate.   Please see review of systems for further details  on the patient's review from today.   Objective:   Physical Exam:  BP 125/72   Pulse 67   Physical Exam Neurological:     Mental Status: He is alert and oriented to person, place, and time.     Cranial Nerves: No dysarthria.     Motor: No tremor.  Psychiatric:        Attention and Perception: Attention normal. He does not perceive auditory hallucinations.        Mood and Affect: Mood is anxious and depressed.        Speech: Speech normal.        Behavior: Behavior is cooperative.        Thought Content: Thought content is not paranoid or delusional. Thought content does not include homicidal or suicidal ideation. Thought content does not include homicidal or suicidal plan.        Cognition and Memory: Cognition and memory normal.        Judgment: Judgment normal.     Comments: Obsessing but manageable. Immed meme 3/3 Easily serial 7's.      Lab Review:  No results found for: NA, K, CL, CO2, GLUCOSE, BUN, CREATININE, CALCIUM, PROT, ALBUMIN, AST, ALT, ALKPHOS, BILITOT, GFRNONAA, GFRAA  No results found for: WBC, RBC, HGB, HCT, PLT, MCV, MCH, MCHC, RDW, LYMPHSABS, MONOABS, EOSABS, BASOSABS  No results found for: POCLITH, LITHIUM   No results found for: PHENYTOIN, PHENOBARB, VALPROATE, CBMZ  Vitamin D level acceptable at 54.5.    Echocardiogram is stable re: AVR over the last 8 years and not likely the cause of lethargy.  .res Assessment: Plan:    Akhilesh was seen today for follow-up, depression and memory loss.  Diagnoses and all orders for this  visit:  Major depressive disorder, recurrent episode, moderate (HCC)  Mixed obsessional thoughts and acts  Restless legs syndrome (RLS)  Obstructive sleep apnea  Low vitamin D level   Greater than 50% of 30 min face to face time with patient was spent on counseling and coordination of care. We discussed Mr. Hohmann has a long history of depression and OCD which are partially controlled.  He believes the depression is a consequence of the residual OCD.  He has some compulsive checking and obsessions around the house maintenance.  He wishes to avoid sexual side effects and so we are keeping the SSRI at the lowest possible dose.  He is tried all of the reasonable SSRI options with the exception possibly of sertraline but it is likely to have more sexual dysfunction than what he is taking now.  When travels then tends to have less OCD bc triggered less.  As discussed  fortunately his anxiety has improved somewhat over time since we increased the Lexapro the last time.  Overall balance is ok between benefit and SE Lexapro for OCD at 10 mg daily.  Overall his level of depression is certainly not severe and not even moderate.  He is able to find things that he enjoys and he does have interests.  He is probably too inactive and not as motivated as he should be which is part of his reason for wanting to increase the Ritalin.  Discussed potential benefits, risks, and side effects of stimulants with patient to include increased heart rate, palpitations, insomnia, increased anxiety, increased irritability, or decreased appetite.  Instructed patient to contact office if experiencing any significant tolerability issues. Disc risk of increasing the Ritalin further elevating BP and pulse if we increase the Ritalin.  Need  to verify that it's not markedly elevated from taking the Ritalin. Doesn't think it's been consistently elevated. Disc crash risk.  He doesn't need feel it.   Continue Ritalin to 40 mg in the  AM and 30 mg PM.  Thinks memory problem relate to OCD bc often counting in the in the background at rest which tends to reduce his attention.  Encourage walking and disc dieting for wieght loss.  Disc need for weight loss. Current #287.     Disc screening tools for cognitive deficit.  Disc options.  Consider MMSE at next visit that occurs in person.  Discussed the options of neuropsychological testing [place] Cog test in office Today within normal limits MMSE 28/30. Animal fluency 17 today. (borderline) Taken as a whole, no indication to pursue neuropsychological testing.  No problem with the switch back to ropinirole. RLS managed.  Still no complaints.  Discussed cognitive behavioral techniques for managing both depressive and anxiety symptoms.  Discussed his delayed sleep phase syndrome which currently is only a problem because it is bothering his wife.  Option Rexulti.   No med changes today.  Needs a longer to adjust to the reduction in Lexapro and discontinuing the lithium.  Disc again potential for low dose lithium as cognitive protection. 150 mg daily per Dr. Arlie SolomonsPost article.  Rec he restart it.  Follow-up 3 weeks  Meredith Staggersarey Cottle MD, DFAPA. Please see After Visit Summary for patient specific instructions.  Future Appointments  Date Time Provider Department Center  11/06/2018  1:30 PM Cottle, Steva Readyarey G Jr., MD [facility] None  11/27/2018  1:00 PM Cottle, Steva Readyarey G Jr., MD [facility] None    No orders of the defined types were placed in this encounter.     -------------------------------

## 2018-10-17 NOTE — Patient Instructions (Addendum)
[place] Cog test in office Today within normal limits [place] MMSE 28/30. Animal fluency 17 today. (borderline- influenced by anxiety) Taken as a whole, no indication to pursue neuropsychological testing.

## 2018-10-24 DIAGNOSIS — Z23 Encounter for immunization: Secondary | ICD-10-CM | POA: Diagnosis not present

## 2018-11-03 DIAGNOSIS — G4733 Obstructive sleep apnea (adult) (pediatric): Secondary | ICD-10-CM | POA: Diagnosis not present

## 2018-11-06 ENCOUNTER — Encounter: Payer: Self-pay | Admitting: Psychiatry

## 2018-11-06 ENCOUNTER — Other Ambulatory Visit: Payer: Self-pay

## 2018-11-06 ENCOUNTER — Ambulatory Visit (INDEPENDENT_AMBULATORY_CARE_PROVIDER_SITE_OTHER): Payer: No Typology Code available for payment source | Admitting: Psychiatry

## 2018-11-06 VITALS — BP 131/89

## 2018-11-06 DIAGNOSIS — R7989 Other specified abnormal findings of blood chemistry: Secondary | ICD-10-CM

## 2018-11-06 DIAGNOSIS — G4733 Obstructive sleep apnea (adult) (pediatric): Secondary | ICD-10-CM | POA: Diagnosis not present

## 2018-11-06 DIAGNOSIS — F331 Major depressive disorder, recurrent, moderate: Secondary | ICD-10-CM

## 2018-11-06 DIAGNOSIS — F422 Mixed obsessional thoughts and acts: Secondary | ICD-10-CM | POA: Diagnosis not present

## 2018-11-06 DIAGNOSIS — G2581 Restless legs syndrome: Secondary | ICD-10-CM | POA: Diagnosis not present

## 2018-11-06 NOTE — Progress Notes (Signed)
Eric [name] [id number] [birth year] 68 y.o.     Subjective:   Patient ID:  Eric [name] is a 68 y.o. (DOB 1950-07-12) male.  Chief Complaint:  Chief Complaint  Patient presents with  . Follow-up    Medication Management  . Depression    Medication Management  . Other    OCD  . Medication Refill    Lithium     Depression        Associated symptoms include fatigue and myalgias.  Associated symptoms include no decreased concentration, no headaches and no suicidal ideas.  Past medical history includes anxiety.   Anxiety Symptoms include nervous/anxious behavior. Patient reports no confusion, decreased concentration, shortness of breath or suicidal ideas.    Medication Refill Associated symptoms include arthralgias, fatigue and myalgias. Pertinent negatives include no headaches or weakness.    Eric [name] presents for [facility] telemedicine today for follow-up of OCD and depression and med changes.  Last visit 3 weeks ago.    No med changes except stopped lithium to see if energy is better.  No change noted there.  Wife concerned about his memory but thinks it's attention related and sees beenfit with Ritalin.  Sexual function is OK if he waits long enough between attempts.  Also disc effects of age and testosterone.  Disc risk of testosterone.  After longer period with less Lexapro he's had a noticed a little more obsessing but managed.  A little worsening OCD about the light switches.  But it is manageable.  Still worry over Covid.  Manages this by   Overall pretty well.  Residual depression and anxiety and OCD as noted before without worsening.  Some anxiety over anticipated cognitive screening tests today. Some chronic worry obsessively over BP though objectively no real concerns.  Sleep varies with when he goes to bed.  Tends to stay up late.   Then sleeps late and some days as much is 12 hours daily.  Has checked his CPAP and his AHI is no more than  2.  Worrying about light switches and counting is worse more than usual but otherwise the OCD is managed.     RLS managed ok unless stays up too late.   Caffeine varies from none to 5 cups.  Wife will be able to teach online this fall bc of Covid.  Prior psychiatric medication trials include Lexapro, citalopram NR, clomipramine weight gain, paroxetine, fluoxetine, Luvox, Trintellix,  Abilify 10 fatigue, bupropion, Cerefolin NAC, and modafinil and Nuvigil, pramipexole, remotely took Adderall and Ritalin. Increase Lexapro back to 20 mg January.  Review of Systems:  Review of Systems  Constitutional: Positive for fatigue.  Respiratory: Negative for shortness of breath.   Musculoskeletal: Positive for arthralgias and myalgias.  Neurological: Negative for tremors, weakness and headaches.  Psychiatric/Behavioral: Positive for depression and dysphoric mood. Negative for agitation, behavioral problems, confusion, decreased concentration, hallucinations, self-injury, sleep disturbance and suicidal ideas. The patient is nervous/anxious. The patient is not hyperactive.     Medications: I have reviewed the patient's current medications.  Current Outpatient Medications  Medication Sig Dispense Refill  . ALPRAZolam (XANAX) 0.25 MG tablet Take 0.25 mg by mouth 2 (two) times daily as needed for anxiety or sleep.    Marland Kitchen aspirin 81 MG tablet Take 81 mg by mouth daily.    Marland Kitchen buPROPion (WELLBUTRIN XL) 150 MG 24 hr tablet TAKE 3 TABLETS (450 MG TOTAL) BY MOUTH DAILY. 270 tablet 0  . Cholecalciferol (VITAMIN D-3) 5000 units TABS Take  10,000 Units by mouth daily.     . Coenzyme Q10 200 MG TABS Take 300 mg by mouth daily.     . CRESTOR 5 MG tablet Take 5 mg by mouth daily.  2  . desonide (DESOWEN) 0.05 % ointment   0  . econazole nitrate 1 % cream Apply topically daily.    Marland Kitchen escitalopram (LEXAPRO) 20 MG tablet TAKE 1 TABLET BY MOUTH EVERY DAY (Patient taking differently: Take 10 mg by mouth daily. ) 60 tablet  0  . lactase (LACTAID) 3000 units tablet Take 3,000 Units by mouth as needed.     . lithium carbonate 150 MG capsule Take 150 mg by mouth at bedtime.    . methylphenidate (RITALIN) 10 MG tablet 3 tablet in the morning and 2 at noon (Patient taking differently: 4 tablet in the morning and 3 at noon) 150 tablet 0  . mupirocin ointment (BACTROBAN) 2 %   1  . NON FORMULARY [employer] high impact plant protein- supplement    . risperiDONE (RISPERDAL) 0.25 MG tablet Take 0.25 mg by mouth daily as needed (tends to brighten mood when needed).     Marland Kitchen rOPINIRole (REQUIP) 1 MG tablet TAKE 3 TABLETS (3 MG TOTAL) BY MOUTH AT BEDTIME. (Patient taking differently: Take 2 mg by mouth at bedtime. ) 180 tablet 1   No current facility-administered medications for this visit.     Medication Side Effects: None sexual SE are better not  All gone.  Allergies:  Allergies  Allergen Reactions  . Erythromycin Hives    Past Medical History:  Diagnosis Date  . Allergy   . Anemia    iron  . Carotid artery occlusion   . Hyperlipidemia   . Obesity   . OCD (obsessive compulsive disorder)   . Periodic limb movement disorder   . Sleep apnea     Family History  Problem Relation Age of Onset  . Cancer Mother        breast and ovarian  . Depression Father        bi-polar    Social History   Socioeconomic History  . Marital status: Married    Spouse name: Not on file  . Number of children: Not on file  . Years of education: Not on file  . Highest education level: Not on file  Occupational History  . Not on file  Social Needs  . Financial resource strain: Not on file  . Food insecurity    Worry: Not on file    Inability: Not on file  . Transportation needs    Medical: Not on file    Non-medical: Not on file  Tobacco Use  . Smoking status: Never Smoker  . Smokeless tobacco: Never Used  Substance and Sexual Activity  . Alcohol use: No  . Drug use: No  . Sexual activity: Not on file  Lifestyle   . Physical activity    Days per week: Not on file    Minutes per session: Not on file  . Stress: Not on file  Relationships  . Social Herbalist on phone: Not on file    Gets together: Not on file    Attends religious service: Not on file    Active member of club or organization: Not on file    Attends meetings of clubs or organizations: Not on file    Relationship status: Not on file  . Intimate partner violence    Fear of current  or ex partner: Not on file    Emotionally abused: Not on file    Physically abused: Not on file    Forced sexual activity: Not on file  Other Topics Concern  . Not on file  Social History Narrative  . Not on file    Past Medical History, Surgical history, Social history, and Family history were reviewed and updated as appropriate.   Please see review of systems for further details on the patient's review from today.   Objective:   Physical Exam:  BP 131/89   Physical Exam Neurological:     Mental Status: He is alert and oriented to person, place, and time.     Cranial Nerves: No dysarthria.     Motor: No tremor.  Psychiatric:        Attention and Perception: Attention normal. He does not perceive auditory hallucinations.        Mood and Affect: Mood is anxious and depressed.        Speech: Speech normal. Speech is not slurred.        Behavior: Behavior is cooperative.        Thought Content: Thought content is not paranoid or delusional. Thought content does not include homicidal or suicidal ideation. Thought content does not include homicidal or suicidal plan.        Cognition and Memory: Cognition and memory normal.        Judgment: Judgment normal.     Comments: Obsessing but manageable.       Lab Review:  No results found for: NA, K, CL, CO2, GLUCOSE, BUN, CREATININE, CALCIUM, PROT, ALBUMIN, AST, ALT, ALKPHOS, BILITOT, GFRNONAA, GFRAA  No results found for: WBC, RBC, HGB, HCT, PLT, MCV, MCH, MCHC, RDW, LYMPHSABS,  MONOABS, EOSABS, BASOSABS  No results found for: POCLITH, LITHIUM   No results found for: PHENYTOIN, PHENOBARB, VALPROATE, CBMZ  Vitamin D level acceptable at 54.5.    Echocardiogram is stable re: AVR over the last 8 years and not likely the cause of lethargy.  .res Assessment: Plan:    Ibrohim was seen today for follow-up, depression, other and medication refill.  Diagnoses and all orders for this visit:  Mixed obsessional thoughts and acts  Restless legs syndrome (RLS)  Major depressive disorder, recurrent episode, moderate (HCC)  Obstructive sleep apnea  Low vitamin D level      Greater than 50% of 30 min face to face time with patient was spent on counseling and coordination of care. We discussed Mr. Kossman has a long history of depression and OCD which are partially controlled.  He believes the depression is a consequence of the residual OCD.  He has some compulsive checking and obsessions around the house maintenance.  He wishes to avoid sexual side effects and so we are keeping the SSRI at the lowest possible dose.  He is tried all of the reasonable SSRI options with the exception possibly of sertraline but it is likely to have more sexual dysfunction than what he is taking now.  When travels then tends to have less OCD bc triggered less.  As discussed  fortunately his anxiety has improved somewhat over time since we increased the Lexapro the last time.  Overall balance is ok between benefit and SE Lexapro for OCD at 10 mg daily.  Overall his level of depression is certainly not severe and not even moderate.  He is able to find things that he enjoys and he does have interests.  He is probably too  inactive and not as motivated as he should be which is part of his reason for wanting to increase the Ritalin.  Discussed potential benefits, risks, and side effects of stimulants with patient to include increased heart rate, palpitations, insomnia, increased anxiety, increased  irritability, or decreased appetite.  Instructed patient to contact office if experiencing any significant tolerability issues. Disc risk of increasing the Ritalin further elevating BP and pulse if we increase the Ritalin.  Need to verify that it's not markedly elevated from taking the Ritalin. Doesn't think it's been consistently elevated. Disc crash risk.  He doesn't need feel it.   Continue Ritalin to 40 mg in the AM and 30 mg PM.  Thinks memory problem relate to OCD bc often counting in the in the background at rest which tends to reduce his attention.  Encourage walking and disc dieting for wieght loss.  Disc need for weight loss. Current #287.     Disc screening tools for cognitive deficit.  Disc options.  Consider MMSE at next visit that occurs in person.  Discussed the options of neuropsychological testing [place] Cog test in office Today within normal limits MMSE 28/30. Animal fluency 17 today. (borderline) Taken as a whole, no indication to pursue neuropsychological testing.  No problem with the switch back to ropinirole. RLS managed.  Still no complaints.  Discussed cognitive behavioral techniques for managing both depressive and anxiety symptoms.  Discussed his delayed sleep phase syndrome which currently is only a problem because it is bothering his wife.  Option Rexulti.   Disc again potential for low dose lithium as cognitive protection. 150 mg daily per Dr. Arlie SolomonsPost article.  Rec he restart it.  Follow-up 3 weeks  Meredith Staggersarey Cottle MD, DFAPA. Please see After Visit Summary for patient specific instructions.  Future Appointments  Date Time Provider Department Center  11/27/2018  1:00 PM Cottle, Steva Readyarey G Jr., MD [facility] None  12/11/2018  2:00 PM Cottle, Steva Readyarey G Jr., MD [facility] None  01/01/2019  1:15 PM Cottle, Steva Readyarey G Jr., MD [facility] None  01/22/2019  1:00 PM Cottle, Steva Readyarey G Jr., MD [facility] None    No orders of the defined types were placed in this encounter.      -------------------------------

## 2018-11-07 ENCOUNTER — Telehealth: Payer: Self-pay | Admitting: Psychiatry

## 2018-11-07 ENCOUNTER — Other Ambulatory Visit: Payer: Self-pay | Admitting: Psychiatry

## 2018-11-07 DIAGNOSIS — I779 Disorder of arteries and arterioles, unspecified: Secondary | ICD-10-CM | POA: Diagnosis not present

## 2018-11-07 DIAGNOSIS — I6522 Occlusion and stenosis of left carotid artery: Secondary | ICD-10-CM | POA: Diagnosis not present

## 2018-11-07 MED ORDER — LITHIUM CARBONATE 150 MG PO CAPS: 150.0000 mg | ORAL_CAPSULE | Freq: Every day | ORAL | 1 refills | Status: DC

## 2018-11-07 NOTE — Telephone Encounter (Signed)
Patient called and left a message said that dr. Clovis Pu  Was suppose to send in lithium 150 mg to the pharmacy. Please call him and let him know it has been taken care of.

## 2018-11-20 ENCOUNTER — Other Ambulatory Visit: Payer: Self-pay

## 2018-11-20 ENCOUNTER — Telehealth: Payer: Self-pay | Admitting: Psychiatry

## 2018-11-20 MED ORDER — METHYLPHENIDATE HCL 10 MG PO TABS: ORAL_TABLET | ORAL | 0 refills | Status: DC

## 2018-11-20 NOTE — Telephone Encounter (Signed)
Please RF Methylphenidate to [facility]. On file

## 2018-11-20 NOTE — Telephone Encounter (Signed)
Last refill is July 2020, pended for approval

## 2018-11-24 ENCOUNTER — Other Ambulatory Visit: Payer: Self-pay | Admitting: Psychiatry

## 2018-11-27 ENCOUNTER — Encounter: Payer: Self-pay | Admitting: Psychiatry

## 2018-11-27 ENCOUNTER — Ambulatory Visit (INDEPENDENT_AMBULATORY_CARE_PROVIDER_SITE_OTHER): Payer: No Typology Code available for payment source | Admitting: Psychiatry

## 2018-11-27 ENCOUNTER — Other Ambulatory Visit: Payer: Self-pay

## 2018-11-27 DIAGNOSIS — G4733 Obstructive sleep apnea (adult) (pediatric): Secondary | ICD-10-CM

## 2018-11-27 DIAGNOSIS — R7989 Other specified abnormal findings of blood chemistry: Secondary | ICD-10-CM

## 2018-11-27 DIAGNOSIS — F331 Major depressive disorder, recurrent, moderate: Secondary | ICD-10-CM | POA: Diagnosis not present

## 2018-11-27 DIAGNOSIS — F422 Mixed obsessional thoughts and acts: Secondary | ICD-10-CM | POA: Diagnosis not present

## 2018-11-27 DIAGNOSIS — G2581 Restless legs syndrome: Secondary | ICD-10-CM | POA: Diagnosis not present

## 2018-11-27 DIAGNOSIS — F5221 Male erectile disorder: Secondary | ICD-10-CM

## 2018-11-27 MED ORDER — METHYLPHENIDATE HCL 10 MG PO TABS: ORAL_TABLET | ORAL | 0 refills | Status: DC

## 2018-11-27 NOTE — Progress Notes (Signed)
Eric [name] [id number] [birth year] 68 y.o.   Virtual Visit via [place]  I connected with pt by WebEx and verified that I am speaking with the correct person using two identifiers.   I discussed the limitations, risks, security and privacy concerns of performing an evaluation and management service by Eric [name] and the availability of in person appointments. I also discussed with the patient that there may be a patient responsible charge related to this service. The patient expressed understanding and agreed to proceed.  I discussed the assessment and treatment plan with the patient. The patient was provided an opportunity to ask questions and all were answered. The patient agreed with the plan and demonstrated an understanding of the instructions.   The patient was advised to call back or seek an in-person evaluation if the symptoms worsen or if the condition fails to improve as anticipated.  I provided 30 minutes of video time during this encounter. The call started at 100 and ended at 1:30. The patient was located at home and the provider was located office.   Subjective:   Patient ID:  Eric [name] is a 68 y.o. (DOB 06-04-1950) male.  Chief Complaint:  Chief Complaint  Patient presents with  . Follow-up    Medication Management  . ADHD  . Anxiety    OCD  . Depression    Depression        Associated symptoms include fatigue and myalgias.  Associated symptoms include no decreased concentration, no headaches and no suicidal ideas.  Past medical history includes anxiety.   Medication Refill Associated symptoms include arthralgias, fatigue and myalgias. Pertinent negatives include no headaches or weakness.  Anxiety Symptoms include nervous/anxious behavior. Patient reports no confusion, decreased concentration, dizziness, shortness of breath or suicidal ideas.      Eric [name] presents for WebEx telemedicine today for follow-up of OCD and depression and med  changes.  Last visit 3 weeks ago.   No improvement in energy of lithium and it was recommended that he restart lithium 150 mg daily for his neuro protective effect.  Disc Ritalin and he thinks it's helpful but makes him fidgety SE.  Legs are jumping. No worsening anxiety.  Still some general malaise.    Going to [facility] 12/4 until 12/21.  Can do video session while he's away.  Disc concerns about insurance company PA including QL for Ritalin over 3 daily.    Overall doing ok.  Did poll observation, but was worried about it.  OCD managed.  Wife more on him about cleaning up the house.  Disc it in relation to the Ritalin timing.  Sexual function is OK if he waits long enough between attempts.  Also disc effects of age and testosterone.  Disc risk of testosterone.  Manageable OCD at the lower dose of Lexapro.  Still issues with light switches.  After longer period with less Lexapro he's had a noticed a little more obsessing but managed.  A little worsening OCD about the light switches.  But it is manageable.  Still worry over Covid but does not exacerbate OCD.  Overall pretty well.  Residual depression and anxiety and OCD as noted before without worsening. Some anxiety over anticipated cognitive screening tests today.  Some chronic worry obsessively over BP though objectively no real concerns.  Sleep varies with when he goes to bed.  Tends to stay up late.   Then sleeps late and some days as much is 12 hours daily.  Has checked his  CPAP and his AHI is no more than 2.  Worrying about light switches and counting is worse more than usual but otherwise the OCD is managed.     RLS managed ok unless stays up too late.  Sleep OK.  Caffeine varies from none to 5 cups.  Wife will be able to teach online this fall bc of Covid.  Prior psychiatric medication trials include Lexapro, citalopram NR, clomipramine weight gain, paroxetine, fluoxetine, Luvox, Trintellix,  Abilify 10 fatigue, bupropion, Cerefolin  NAC, and modafinil and Nuvigil, pramipexole, remotely took Adderall and Ritalin. Increase Lexapro back to 20 mg January.  Review of Systems:  Review of Systems  Constitutional: Positive for fatigue.  Respiratory: Negative for shortness of breath.   Musculoskeletal: Positive for arthralgias and myalgias.  Neurological: Negative for dizziness, tremors, weakness and headaches.  Psychiatric/Behavioral: Positive for depression and dysphoric mood. Negative for agitation, behavioral problems, confusion, decreased concentration, hallucinations, self-injury, sleep disturbance and suicidal ideas. The patient is nervous/anxious. The patient is not hyperactive.     Medications: I have reviewed the patient's current medications.  Current Outpatient Medications  Medication Sig Dispense Refill  . ALPRAZolam (XANAX) 0.25 MG tablet Take 0.25 mg by mouth 2 (two) times daily as needed for anxiety or sleep.    Marland Kitchen aspirin 81 MG tablet Take 81 mg by mouth daily.    Marland Kitchen buPROPion (WELLBUTRIN XL) 150 MG 24 hr tablet TAKE 3 TABLETS (450 MG TOTAL) BY MOUTH DAILY. 270 tablet 0  . Cholecalciferol (VITAMIN D-3) 5000 units TABS Take 10,000 Units by mouth daily.     . Coenzyme Q10 200 MG TABS Take 300 mg by mouth daily.     . CRESTOR 5 MG tablet Take 5 mg by mouth daily.  2  . desonide (DESOWEN) 0.05 % ointment   0  . econazole nitrate 1 % cream Apply topically daily.    Marland Kitchen escitalopram (LEXAPRO) 20 MG tablet TAKE 1 TABLET BY MOUTH EVERY DAY (Patient taking differently: Take 10 mg by mouth daily. ) 60 tablet 0  . lactase (LACTAID) 3000 units tablet Take 3,000 Units by mouth as needed.     . lithium carbonate 150 MG capsule Take 1 capsule (150 mg total) by mouth at bedtime. 90 capsule 1  . methylphenidate (RITALIN) 10 MG tablet 4 tablet in the morning and 3 at noon 210 tablet 0  . mupirocin ointment (BACTROBAN) 2 %   1  . NON FORMULARY [employer] high impact plant protein- supplement    . risperiDONE (RISPERDAL) 0.25 MG  tablet Take 0.25 mg by mouth daily as needed (tends to brighten mood when needed).     Marland Kitchen rOPINIRole (REQUIP) 1 MG tablet TAKE 3 TABLETS (3 MG TOTAL) BY MOUTH AT BEDTIME. (Patient taking differently: Take 2 mg by mouth at bedtime. ) 270 tablet 0   No current facility-administered medications for this visit.     Medication Side Effects: None sexual SE are better not  All gone.  Allergies:  Allergies  Allergen Reactions  . Erythromycin Hives    Past Medical History:  Diagnosis Date  . Allergy   . Anemia    iron  . Carotid artery occlusion   . Hyperlipidemia   . Obesity   . OCD (obsessive compulsive disorder)   . Periodic limb movement disorder   . Sleep apnea     Family History  Problem Relation Age of Onset  . Cancer Mother        breast and ovarian  .  Depression Father        bi-polar    Social History   Socioeconomic History  . Marital status: Married    Spouse name: Not on file  . Number of children: Not on file  . Years of education: Not on file  . Highest education level: Not on file  Occupational History  . Not on file  Social Needs  . Financial resource strain: Not on file  . Food insecurity    Worry: Not on file    Inability: Not on file  . Transportation needs    Medical: Not on file    Non-medical: Not on file  Tobacco Use  . Smoking status: Never Smoker  . Smokeless tobacco: Never Used  Substance and Sexual Activity  . Alcohol use: No  . Drug use: No  . Sexual activity: Not on file  Lifestyle  . Physical activity    Days per week: Not on file    Minutes per session: Not on file  . Stress: Not on file  Relationships  . Social Musicianconnections    Talks on phone: Not on file    Gets together: Not on file    Attends religious service: Not on file    Active member of club or organization: Not on file    Attends meetings of clubs or organizations: Not on file    Relationship status: Not on file  . Intimate partner violence    Fear of current or  ex partner: Not on file    Emotionally abused: Not on file    Physically abused: Not on file    Forced sexual activity: Not on file  Other Topics Concern  . Not on file  Social History Narrative  . Not on file    Past Medical History, Surgical history, Social history, and Family history were reviewed and updated as appropriate.   Please see review of systems for further details on the patient's review from today.   Objective:   Physical Exam:  There were no vitals taken for this visit.  Physical Exam Neurological:     Mental Status: He is alert and oriented to person, place, and time.     Cranial Nerves: No dysarthria.     Motor: No tremor.  Psychiatric:        Attention and Perception: Attention normal. He does not perceive auditory hallucinations.        Mood and Affect: Mood is anxious. Mood is not depressed.        Speech: Speech normal. Speech is not slurred.        Behavior: Behavior is cooperative.        Thought Content: Thought content is not paranoid or delusional. Thought content does not include homicidal or suicidal ideation. Thought content does not include homicidal or suicidal plan.        Cognition and Memory: Cognition and memory normal.        Judgment: Judgment normal.     Comments: Obsessing but manageable.     November 06, 2018: [place] Cog test in office within normal limits MMSE 28/30. Animal fluency 17 . (borderline) Taken as a whole, no indication to pursue neuropsychological testing.  Lab Review:  No results found for: NA, K, CL, CO2, GLUCOSE, BUN, CREATININE, CALCIUM, PROT, ALBUMIN, AST, ALT, ALKPHOS, BILITOT, GFRNONAA, GFRAA  No results found for: WBC, RBC, HGB, HCT, PLT, MCV, MCH, MCHC, RDW, LYMPHSABS, MONOABS, EOSABS, BASOSABS  No results found for: POCLITH, LITHIUM  No results found for: PHENYTOIN, PHENOBARB, VALPROATE, CBMZ  Vitamin D level acceptable at 54.5.    Echocardiogram is stable re: AVR over the last 8 years and not likely  the cause of lethargy.  .res Assessment: Plan:    Eric [name] was seen today for follow-up, adhd, anxiety and depression.  Diagnoses and all orders for this visit:  Mixed obsessional thoughts and acts  Restless legs syndrome (RLS)  Major depressive disorder, recurrent episode, moderate (HCC)  Obstructive sleep apnea  Low vitamin D level  Erectile disorder, acquired, generalized, moderate  Other orders -     methylphenidate (RITALIN) 10 MG tablet; 4 tablet in the morning and 3 at noon      Greater than 50% of 30 min face to face time with patient was spent on counseling and coordination of care. We discussed Mr. Eltzroth has a long history of depression and OCD which are partially controlled.  He believes the depression is a consequence of the residual OCD.  He has some compulsive checking and obsessions around the house maintenance.  He wishes to avoid sexual side effects and so we are keeping the SSRI at the lowest possible dose.  He is tried all of the reasonable SSRI options with the exception possibly of sertraline but it is likely to have more sexual dysfunction than what he is taking now.  When travels then tends to have less OCD bc triggered less.  As discussed  fortunately his anxiety has improved somewhat over time since we increased the Lexapro the last time.  Overall balance is ok between benefit and SE Lexapro for OCD at 10 mg daily.  Overall his level of depression is certainly not severe and not even moderate.  He is able to find things that he enjoys and he does have interests.  He is probably too inactive and not as motivated as he should be which is part of his reason for wanting to increase the Ritalin.  Discussed potential benefits, risks, and side effects of stimulants with patient to include increased heart rate, palpitations, insomnia, increased anxiety, increased irritability, or decreased appetite.  Instructed patient to contact office if experiencing any  significant tolerability issues. Disc risk of increasing the Ritalin further elevating BP and pulse if we increase the Ritalin.  Need to verify that it's not markedly elevated from taking the Ritalin. Doesn't think it's been consistently elevated. Disc crash risk.  He doesn't need feel it.   Continue Ritalin to 40 mg in the AM and 30 mg PM or try  40 mg AM and 20 PM DT insurance requirements. He feels that this is helpful. Thinks memory problem relate to OCD bc often counting in the in the background at rest which tends to reduce his attention.  Encourage walking and disc dieting for wieght loss.  Disc need for weight loss. Current #287.     Disc screening tools for cognitive deficit.  Disc options.  Consider MMSE at next visit that occurs in person.  Discussed the options of neuropsychological testing  No problem with the switch back to ropinirole. RLS managed.  Still no complaints.  Discussed cognitive behavioral techniques for managing both depressive and anxiety symptoms.  Discussed his delayed sleep phase syndrome which currently is only a problem because it is bothering his wife.  Option Rexulti.   Disc again potential for low dose lithium as cognitive protection. 150 mg daily per Dr. Olene Floss article.  Rec he restart it.  Follow-up 3 weeks  Eric Parents  MD, DFAPA. Please see After Visit Summary for patient specific instructions.  Future Appointments  Date Time Provider Department Center  12/11/2018  2:00 PM Cottle, Steva Ready., MD [facility] None  01/01/2019  1:15 PM Cottle, Steva Ready., MD [facility] None  01/22/2019  1:00 PM Cottle, Steva Ready., MD [facility] None    No orders of the defined types were placed in this encounter.     -------------------------------

## 2018-12-11 ENCOUNTER — Ambulatory Visit (INDEPENDENT_AMBULATORY_CARE_PROVIDER_SITE_OTHER): Payer: No Typology Code available for payment source | Admitting: Psychiatry

## 2018-12-11 ENCOUNTER — Encounter: Payer: Self-pay | Admitting: Psychiatry

## 2018-12-11 DIAGNOSIS — F422 Mixed obsessional thoughts and acts: Secondary | ICD-10-CM | POA: Diagnosis not present

## 2018-12-11 DIAGNOSIS — R7989 Other specified abnormal findings of blood chemistry: Secondary | ICD-10-CM

## 2018-12-11 DIAGNOSIS — G4733 Obstructive sleep apnea (adult) (pediatric): Secondary | ICD-10-CM

## 2018-12-11 DIAGNOSIS — F5221 Male erectile disorder: Secondary | ICD-10-CM

## 2018-12-11 DIAGNOSIS — F331 Major depressive disorder, recurrent, moderate: Secondary | ICD-10-CM

## 2018-12-11 DIAGNOSIS — G2581 Restless legs syndrome: Secondary | ICD-10-CM | POA: Diagnosis not present

## 2018-12-11 NOTE — Progress Notes (Signed)
Eric BisMichael R Lobdell [id number] 1950/11/09 68 y.o.   Virtual Visit via [facility]  I connected with pt by WebEx and verified that I am speaking with the correct person using two identifiers.   I discussed the limitations, risks, security and privacy concerns of performing an evaluation and management service by [facility] and the availability of in person appointments. I also discussed with the patient that there may be a patient responsible charge related to this service. The patient expressed understanding and agreed to proceed.  I discussed the assessment and treatment plan with the patient. The patient was provided an opportunity to ask questions and all were answered. The patient agreed with the plan and demonstrated an understanding of the instructions.   The patient was advised to call back or seek an in-person evaluation if the symptoms worsen or if the condition fails to improve as anticipated.  I provided 30 minutes of video time during this encounter. The call started at 200 and ended at 2:30. The patient was located at home and the provider was located office.   Subjective:   Patient ID:  Eric [name] is a 68 y.o. (DOB 1950/11/09) male.  Chief Complaint:  Chief Complaint  Patient presents with  . Follow-up    Mediction Management  . Other    Mixed obsessional thoughts and acts  . Sexual Problem    from Lexapro    Depression        Associated symptoms include fatigue and myalgias.  Associated symptoms include no decreased concentration, no headaches and no suicidal ideas.  Past medical history includes anxiety.   Medication Refill Associated symptoms include arthralgias, fatigue and myalgias. Pertinent negatives include no headaches or weakness.  Anxiety Symptoms include nervous/anxious behavior. Patient reports no confusion, decreased concentration, dizziness, shortness of breath or suicidal ideas.      Eric [name] presents for [facility] telemedicine today for  follow-up of OCD and depression and med changes.  Last visit November 27, 2018.   No improvement in energy of lithium and it was recommended that he restart lithium 150 mg daily for his neuro protective effect.  Overall seems fairly well.  Residual OCD.  Not overly anxious over Covid but wife is somewhat.    Disc Ritalin and he thinks it's helpful but makes him fidgety SE.  Legs are jumping. No worsening anxiety.  Still some general malaise.    D and 2 gkids in [place] but trip will be cancelled soon.   Can do video session while he's away.  Taking Ritalin just once daily bc gets up late.  Sig delayed sleep cycle.  Average 8 but sometimes only 6 hours like last night.    Sexual function is OK if he waits long enough between attempts.  Also disc effects of age and testosterone.  Disc risk of testosterone.  Manageable OCD at the lower dose of Lexapro.  Still issues with light switches.  After longer period with less Lexapro he's had a noticed a little more obsessing but managed.  A little worsening OCD about the light switches.  But it is manageable.  Still worry over Covid but does not exacerbate OCD.  Interest and enjoyment is reduced.  Too much time on the computer but tolerable bc interest in sex is stil high.    Sleep varies with when he goes to bed.  Tends to stay up late.   Then sleeps late and some days as much is 12 hours daily.  Has checked his CPAP  and his AHI is no more than 2.  Worrying about light switches and counting is worse more than usual but otherwise the OCD is managed.     RLS managed ok unless stays up too late.  Sleep good without trouble except as noted. .  Caffeine varies from none to 5 cups.  Infrequent Xanax.   Disc article in the [employer] that says Asimov was correct and individuals cannot change history and that political discord will continue.    Wife will be able to teach online this fall bc of Covid.  Prior psychiatric medication trials include Lexapro,  citalopram NR, clomipramine weight gain, paroxetine, fluoxetine, Luvox, Trintellix,  Abilify 10 fatigue, bupropion, Cerefolin NAC, and modafinil and Nuvigil, pramipexole, remotely took Adderall and Ritalin. Increase Lexapro back to 20 mg January.  Review of Systems:  Review of Systems  Constitutional: Positive for fatigue.  Respiratory: Negative for shortness of breath.   Musculoskeletal: Positive for arthralgias and myalgias.  Neurological: Negative for dizziness, tremors, weakness and headaches.  Psychiatric/Behavioral: Positive for depression and dysphoric mood. Negative for agitation, behavioral problems, confusion, decreased concentration, hallucinations, self-injury, sleep disturbance and suicidal ideas. The patient is nervous/anxious. The patient is not hyperactive.     Medications: I have reviewed the patient's current medications.  Current Outpatient Medications  Medication Sig Dispense Refill  . ALPRAZolam (XANAX) 0.25 MG tablet Take 0.25 mg by mouth 2 (two) times daily as needed for anxiety or sleep.    Marland Kitchen aspirin 81 MG tablet Take 81 mg by mouth daily.    Marland Kitchen buPROPion (WELLBUTRIN XL) 150 MG 24 hr tablet TAKE 3 TABLETS (450 MG TOTAL) BY MOUTH DAILY. 270 tablet 0  . Cholecalciferol (VITAMIN D-3) 5000 units TABS Take 10,000 Units by mouth daily.     . Coenzyme Q10 200 MG TABS Take 300 mg by mouth daily.     . CRESTOR 5 MG tablet Take 5 mg by mouth daily.  2  . Cyanocobalamin (VITAMIN B 12) 500 MCG TABS Take by mouth.    . desonide (DESOWEN) 0.05 % ointment   0  . econazole nitrate 1 % cream Apply topically daily.    Marland Kitchen escitalopram (LEXAPRO) 20 MG tablet TAKE 1 TABLET BY MOUTH EVERY DAY (Patient taking differently: Take 10 mg by mouth daily. ) 60 tablet 0  . lactase (LACTAID) 3000 units tablet Take 3,000 Units by mouth as needed.     . lithium carbonate 150 MG capsule Take 1 capsule (150 mg total) by mouth at bedtime. 90 capsule 1  . methylphenidate (RITALIN) 10 MG tablet 4 tablet  in the morning and 3 at noon (Patient taking differently: 3 tablet in the morning and 3 at noon) 210 tablet 0  . mupirocin ointment (BACTROBAN) 2 %   1  . NON FORMULARY [employer] high impact plant protein- supplement    . risperiDONE (RISPERDAL) 0.25 MG tablet Take 0.25 mg by mouth daily as needed (tends to brighten mood when needed).     Marland Kitchen rOPINIRole (REQUIP) 1 MG tablet TAKE 3 TABLETS (3 MG TOTAL) BY MOUTH AT BEDTIME. (Patient taking differently: Take 2 mg by mouth at bedtime. ) 270 tablet 0   No current facility-administered medications for this visit.     Medication Side Effects: None sexual SE are better not  All gone.  Allergies:  Allergies  Allergen Reactions  . Erythromycin Hives    Past Medical History:  Diagnosis Date  . Allergy   . Anemia    iron  .  Carotid artery occlusion   . Hyperlipidemia   . Obesity   . OCD (obsessive compulsive disorder)   . Periodic limb movement disorder   . Sleep apnea     Family History  Problem Relation Age of Onset  . Cancer Mother        breast and ovarian  . Depression Father        bi-polar    Social History   Socioeconomic History  . Marital status: Married    Spouse name: Not on file  . Number of children: Not on file  . Years of education: Not on file  . Highest education level: Not on file  Occupational History  . Not on file  Social Needs  . Financial resource strain: Not on file  . Food insecurity    Worry: Not on file    Inability: Not on file  . Transportation needs    Medical: Not on file    Non-medical: Not on file  Tobacco Use  . Smoking status: Never Smoker  . Smokeless tobacco: Never Used  Substance and Sexual Activity  . Alcohol use: No  . Drug use: No  . Sexual activity: Not on file  Lifestyle  . Physical activity    Days per week: Not on file    Minutes per session: Not on file  . Stress: Not on file  Relationships  . Social Musician on phone: Not on file    Gets together:  Not on file    Attends religious service: Not on file    Active member of club or organization: Not on file    Attends meetings of clubs or organizations: Not on file    Relationship status: Not on file  . Intimate partner violence    Fear of current or ex partner: Not on file    Emotionally abused: Not on file    Physically abused: Not on file    Forced sexual activity: Not on file  Other Topics Concern  . Not on file  Social History Narrative  . Not on file    Past Medical History, Surgical history, Social history, and Family history were reviewed and updated as appropriate.   Please see review of systems for further details on the patient's review from today.   Objective:   Physical Exam:  There were no vitals taken for this visit.  Physical Exam Neurological:     Mental Status: He is alert and oriented to person, place, and time.     Cranial Nerves: No dysarthria.     Motor: No tremor.  Psychiatric:        Attention and Perception: Attention normal. He does not perceive auditory hallucinations.        Mood and Affect: Mood is anxious. Mood is not depressed.        Speech: Speech normal. Speech is not slurred.        Behavior: Behavior is cooperative.        Thought Content: Thought content is not paranoid or delusional. Thought content does not include homicidal or suicidal ideation. Thought content does not include homicidal or suicidal plan.        Cognition and Memory: Cognition and memory normal.        Judgment: Judgment normal.     Comments: Obsessing but manageable.     November 06, 2018: [place] Cog test in office within normal limits MMSE 28/30. Animal fluency 17 . (borderline) Taken as a  whole, no indication to pursue neuropsychological testing.  Lab Review:  No results found for: NA, K, CL, CO2, GLUCOSE, BUN, CREATININE, CALCIUM, PROT, ALBUMIN, AST, ALT, ALKPHOS, BILITOT, GFRNONAA, GFRAA  No results found for: WBC, RBC, HGB, HCT, PLT, MCV, MCH, MCHC,  RDW, LYMPHSABS, MONOABS, EOSABS, BASOSABS  No results found for: POCLITH, LITHIUM   No results found for: PHENYTOIN, PHENOBARB, VALPROATE, CBMZ  Vitamin D level acceptable at 54.5.    Echocardiogram is stable re: AVR over the last 8 years and not likely the cause of lethargy.  .res Assessment: Plan:    Kamarius was seen today for follow-up, other and sexual problem.  Diagnoses and all orders for this visit:  Mixed obsessional thoughts and acts  Major depressive disorder, recurrent episode, moderate (HCC)  Restless legs syndrome (RLS)  Obstructive sleep apnea  Low vitamin D level  Erectile disorder, acquired, generalized, moderate    Greater than 50% of 30 min face to face time with patient was spent on counseling and coordination of care. We discussed Mr. Giraud has a long history of depression and OCD which are partially controlled.  He requires frequent follow-up and his request because of significant residual depression and anxiety and concerns about polypharmacy and he wants regular therapeutic advice on how to further reduce his OCD.  He believes the depression is a consequence of the residual OCD.  He has some compulsive checking and obsessions around the house maintenance.  He wishes to avoid sexual side effects and so we are keeping the SSRI at the lowest possible dose.  He is tried all of the reasonable SSRI options with the exception possibly of sertraline but it is likely to have more sexual dysfunction than what he is taking now.  When travels then tends to have less OCD bc triggered less.  As discussed  fortunately his anxiety has improved somewhat over time since we increased the Lexapro the last time.  Overall balance is ok between benefit and SE Lexapro for OCD at 10 mg daily.  His OCD is persistent with checking lites, stove, etc. but it is not heavily time-consuming and is mildly to moderately distressing.  Overall his level of depression is moderate.  He is  able to find things that he enjoys and he does have interests but they are reduced below normal for him.Marland Kitchen  He is probably too inactive and not as motivated as he should be which is part of his reason for wanting to increase the Ritalin.  Discussed potential benefits, risks, and side effects of stimulants with patient to include increased heart rate, palpitations, insomnia, increased anxiety, increased irritability, or decreased appetite.  Instructed patient to contact office if experiencing any significant tolerability issues. Disc risk of increasing the Ritalin further elevating BP and pulse if we increase the Ritalin.  Need to verify that it's not markedly elevated from taking the Ritalin. Doesn't think it's been consistently elevated. Disc crash risk.  He doesn't need feel it.   Continue Ritalin to 40 mg in the AM and 30 mg PM or try  40 mg AM and 20 PM DT insurance requirements. He feels that this is helpful. Thinks memory problem relate to OCD bc often counting in the in the background at rest which tends to reduce his attention.  Encourage walking and disc dieting for wieght loss.  Disc need for weight loss. Current #287.     No problem with the switch back to ropinirole. RLS managed.  Still no complaints.  Discussed  cognitive behavioral techniques for managing both depressive and anxiety symptoms.  For example discussed delay in giving into the compulsion as a form of extinction and response prevention.  Discussed his delayed sleep phase syndrome which currently is only a problem because it is bothering his wife.  However this is fairly chronic and not changing.  Option Rexulti.   He has resumed lithium 150 mg daily for its neuro protective effect.  He remains concerned about his short-term memory as previously noted  No med changes today Follow-up 3 weeks  Meredith Staggers MD, DFAPA. Please see After Visit Summary for patient specific instructions.  Future Appointments  Date Time  Provider Department Center  01/01/2019  1:15 PM Cottle, Steva Ready., MD [facility] None  01/22/2019  1:00 PM Cottle, Steva Ready., MD [facility] None    No orders of the defined types were placed in this encounter.     -------------------------------

## 2018-12-23 DIAGNOSIS — G4733 Obstructive sleep apnea (adult) (pediatric): Secondary | ICD-10-CM | POA: Diagnosis not present

## 2018-12-24 ENCOUNTER — Other Ambulatory Visit: Payer: Self-pay | Admitting: Psychiatry

## 2018-12-24 NOTE — Telephone Encounter (Signed)
Last note is for 10 mg not 20 mg?

## 2019-01-01 ENCOUNTER — Other Ambulatory Visit: Payer: Self-pay

## 2019-01-01 ENCOUNTER — Ambulatory Visit (INDEPENDENT_AMBULATORY_CARE_PROVIDER_SITE_OTHER): Payer: No Typology Code available for payment source | Admitting: Psychiatry

## 2019-01-01 ENCOUNTER — Encounter: Payer: Self-pay | Admitting: Psychiatry

## 2019-01-01 DIAGNOSIS — G4733 Obstructive sleep apnea (adult) (pediatric): Secondary | ICD-10-CM

## 2019-01-01 DIAGNOSIS — F422 Mixed obsessional thoughts and acts: Secondary | ICD-10-CM

## 2019-01-01 DIAGNOSIS — G2581 Restless legs syndrome: Secondary | ICD-10-CM

## 2019-01-01 DIAGNOSIS — F5221 Male erectile disorder: Secondary | ICD-10-CM

## 2019-01-01 DIAGNOSIS — R7989 Other specified abnormal findings of blood chemistry: Secondary | ICD-10-CM

## 2019-01-01 DIAGNOSIS — F331 Major depressive disorder, recurrent, moderate: Secondary | ICD-10-CM | POA: Diagnosis not present

## 2019-01-01 NOTE — Progress Notes (Signed)
Eric [name] [id number] 1950/05/06 68 y.o.   Virtual Visit via [place]  I connected with pt by WebEx and verified that I am speaking with the correct person using two identifiers.   I discussed the limitations, risks, security and privacy concerns of performing an evaluation and management service by [facility] and the availability of in person appointments. I also discussed with the patient that there may be a patient responsible charge related to this service. The patient expressed understanding and agreed to proceed.  I discussed the assessment and treatment plan with the patient. The patient was provided an opportunity to ask questions and all were answered. The patient agreed with the plan and demonstrated an understanding of the instructions.   The patient was advised to call back or seek an in-person evaluation if the symptoms worsen or if the condition fails to improve as anticipated.  I provided 30 minutes of video time during this encounter. The call started at 115 and ended at 1:45. The patient was located at home and the provider was located office.   Subjective:   Patient ID:  Eric [name] is a 68 y.o. (DOB [birth year]) male.  Chief Complaint:  Chief Complaint  Patient presents with  . Follow-up    Medication Management  . Depression    Medication Management  . OCD  . Fatigue    Depression        Associated symptoms include fatigue and myalgias.  Associated symptoms include no decreased concentration, no headaches and no suicidal ideas.  Past medical history includes anxiety.   Medication Refill Associated symptoms include arthralgias, fatigue and myalgias. Pertinent negatives include no headaches or weakness.  Anxiety Symptoms include nervous/anxious behavior. Patient reports no confusion, decreased concentration, dizziness, shortness of breath or suicidal ideas.      Eric [name] presents for WebEx telemedicine today for follow-up of OCD and  depression and med changes.  visit November 27, 2018.   No improvement in energy of lithium and it was recommended that he restart lithium 150 mg daily for his neuro protective effect.  Last visit December 11, 2018.  No meds were changed.  He was satisfied with the meds currently prescribed.  Pretty good but not great.  OCD and worry seem worse in evening when the Ritalin is wearing off he thinks.  W noted December is usually a bad month for him DT anniversary issues.   Overall seems fairly well.  Residual OCD.  Not overly anxious over Covid but wife is somewhat.    Disc Ritalin and he thinks it's helpful but makes him fidgety SE.  Legs are jumping. No worsening anxiety.  Still some general malaise.    D and 2 gkids in [place] but trip will be cancelled soon.   Can do video session while he's away.  Taking Ritalin just once daily bc gets up late.  Sig delayed sleep cycle.  Average 8 but sometimes only 6 hours like last night.    Sexual function is OK if he waits long enough between attempts.  Also disc effects of age and testosterone.  Disc risk of testosterone.  Manageable OCD at the lower dose of Lexapro.  Still issues with light switches.  After longer period with less Lexapro he's had a noticed a little more obsessing but managed.  A little worsening OCD about the light switches.  But it is manageable.  Still worry over Covid but does not exacerbate OCD.  Interest and enjoyment is reduced.  Too  much time on the computer but tolerable bc interest in sex is stil high and function good.  Sleep varies with when he goes to bed.  Tends to stay up late.   Then sleeps late and some days as much is 12 hours daily.  Has checked his CPAP and his AHI is no more than 2.  Worrying about light switches and counting is worse more than usual but otherwise the OCD is managed.     RLS managed ok unless stays up too late.  Sleep good without trouble except as noted. .  Caffeine varies from none to 5 cups.   Infrequent Xanax.   Disc article in the [place] that says Asimov was correct and individuals cannot change history and that political discord will continue.    Wife will be able to teach online this fall bc of Covid.  Prior psychiatric medication trials include Lexapro, citalopram NR, clomipramine weight gain, paroxetine, fluoxetine, Luvox, Trintellix,   bupropion, Abilify 10 fatigue, Cerefolin NAC, and modafinil and Nuvigil, pramipexole,  remotely took Adderall and Ritalin. Increase Lexapro back to 20 mg January.  Review of Systems:  Review of Systems  Constitutional: Positive for fatigue.  Respiratory: Negative for shortness of breath.   Musculoskeletal: Positive for arthralgias and myalgias.  Neurological: Negative for dizziness, tremors, weakness and headaches.  Psychiatric/Behavioral: Positive for depression and dysphoric mood. Negative for agitation, behavioral problems, confusion, decreased concentration, hallucinations, self-injury, sleep disturbance and suicidal ideas. The patient is nervous/anxious. The patient is not hyperactive.     Medications: I have reviewed the patient's current medications.  Current Outpatient Medications  Medication Sig Dispense Refill  . ALPRAZolam (XANAX) 0.25 MG tablet Take 0.25 mg by mouth 2 (two) times daily as needed for anxiety or sleep.    Marland Kitchen aspirin 81 MG tablet Take 81 mg by mouth daily.    Marland Kitchen buPROPion (WELLBUTRIN XL) 150 MG 24 hr tablet TAKE 3 TABLETS (450 MG TOTAL) BY MOUTH DAILY. 270 tablet 0  . Cholecalciferol (VITAMIN D-3) 5000 units TABS Take 10,000 Units by mouth daily.     . Coenzyme Q10 200 MG TABS Take 300 mg by mouth daily.     . CRESTOR 5 MG tablet Take 5 mg by mouth daily.  2  . Cyanocobalamin (VITAMIN B 12) 500 MCG TABS Take by mouth.    . desonide (DESOWEN) 0.05 % ointment   0  . econazole nitrate 1 % cream Apply topically daily.    Marland Kitchen escitalopram (LEXAPRO) 20 MG tablet TAKE 1 TABLET BY MOUTH EVERY DAY (Patient taking  differently: 10 mg. ) 90 tablet 0  . lactase (LACTAID) 3000 units tablet Take 3,000 Units by mouth as needed.     . lithium carbonate 150 MG capsule Take 1 capsule (150 mg total) by mouth at bedtime. 90 capsule 1  . methylphenidate (RITALIN) 10 MG tablet 4 tablet in the morning and 3 at noon (Patient taking differently: 3 tablet in the morning and 3 at noon) 210 tablet 0  . mupirocin ointment (BACTROBAN) 2 %   1  . risperiDONE (RISPERDAL) 0.25 MG tablet Take 0.25 mg by mouth daily as needed (tends to brighten mood when needed).     Marland Kitchen rOPINIRole (REQUIP) 1 MG tablet TAKE 3 TABLETS (3 MG TOTAL) BY MOUTH AT BEDTIME. (Patient taking differently: Take 2 mg by mouth at bedtime. ) 270 tablet 0   No current facility-administered medications for this visit.    Medication Side Effects: None sexual SE are better  not  All gone.  Allergies:  Allergies  Allergen Reactions  . Erythromycin Hives    Past Medical History:  Diagnosis Date  . Allergy   . Anemia    iron  . Carotid artery occlusion   . Hyperlipidemia   . Obesity   . OCD (obsessive compulsive disorder)   . Periodic limb movement disorder   . Sleep apnea     Family History  Problem Relation Age of Onset  . Cancer Mother        breast and ovarian  . Depression Father        bi-polar    Social History   Socioeconomic History  . Marital status: Married    Spouse name: Not on file  . Number of children: Not on file  . Years of education: Not on file  . Highest education level: Not on file  Occupational History  . Not on file  Tobacco Use  . Smoking status: Never Smoker  . Smokeless tobacco: Never Used  Substance and Sexual Activity  . Alcohol use: No  . Drug use: No  . Sexual activity: Not on file  Other Topics Concern  . Not on file  Social History Narrative  . Not on file   Social Determinants of Health   Financial Resource Strain:   . Difficulty of Paying Living Expenses: Not on file  Food Insecurity:   .  Worried About Programme researcher, broadcasting/film/video in the Last Year: Not on file  . Ran Out of Food in the Last Year: Not on file  Transportation Needs:   . Lack of Transportation (Medical): Not on file  . Lack of Transportation (Non-Medical): Not on file  Physical Activity:   . Days of Exercise per Week: Not on file  . Minutes of Exercise per Session: Not on file  Stress:   . Feeling of Stress : Not on file  Social Connections:   . Frequency of Communication with Friends and Family: Not on file  . Frequency of Social Gatherings with Friends and Family: Not on file  . Attends Religious Services: Not on file  . Active Member of Clubs or Organizations: Not on file  . Attends Banker Meetings: Not on file  . Marital Status: Not on file  Intimate Partner Violence:   . Fear of Current or Ex-Partner: Not on file  . Emotionally Abused: Not on file  . Physically Abused: Not on file  . Sexually Abused: Not on file    Past Medical History, Surgical history, Social history, and Family history were reviewed and updated as appropriate.   Please see review of systems for further details on the patient's review from today.   Objective:   Physical Exam:  There were no vitals taken for this visit.  Physical Exam Neurological:     Mental Status: He is alert and oriented to person, place, and time.     Cranial Nerves: No dysarthria.     Motor: No tremor.  Psychiatric:        Attention and Perception: Attention normal. He does not perceive auditory hallucinations.        Mood and Affect: Mood is anxious and depressed.        Speech: Speech normal. Speech is not slurred.        Behavior: Behavior is cooperative.        Thought Content: Thought content is not paranoid or delusional. Thought content does not include homicidal or suicidal ideation. Thought  content does not include homicidal or suicidal plan.        Cognition and Memory: Cognition and memory normal.        Judgment: Judgment normal.      Comments: Obsessing but manageable. Anxiety is a little worse especially towards the evening and wonders if it is right related to timing of Ritalin wearing off    November 06, 2018: [place] Cog test in office within normal limits MMSE 28/30. Animal fluency 17 . (borderline) Taken as a whole, no indication to pursue neuropsychological testing.  Lab Review:  No results found for: NA, K, CL, CO2, GLUCOSE, BUN, CREATININE, CALCIUM, PROT, ALBUMIN, AST, ALT, ALKPHOS, BILITOT, GFRNONAA, GFRAA  No results found for: WBC, RBC, HGB, HCT, PLT, MCV, MCH, MCHC, RDW, LYMPHSABS, MONOABS, EOSABS, BASOSABS  No results found for: POCLITH, LITHIUM   No results found for: PHENYTOIN, PHENOBARB, VALPROATE, CBMZ  Vitamin D level acceptable at 54.5.    Echocardiogram is stable re: AVR over the last 8 years and not likely the cause of lethargy.  .res Assessment: Plan:    Eric NeedleMichael was seen today for follow-up, depression, ocd and fatigue.  Diagnoses and all orders for this visit:  Mixed obsessional thoughts and acts  Major depressive disorder, recurrent episode, moderate (HCC)  Restless legs syndrome (RLS)  Obstructive sleep apnea  Low vitamin D level  Erectile disorder, acquired, generalized, moderate    Greater than 50% of 30 min face to face time with patient was spent on counseling and coordination of care. We discussed Mr. Paulita Cradleendergraft has a long history of depression and OCD which are partially controlled.  He requires frequent follow-up and his request because of significant residual depression and anxiety and concerns about polypharmacy and he wants regular therapeutic advice on how to further reduce his OCD.  He believes the depression is a consequence of the residual OCD.  He has some compulsive checking and obsessions around the house maintenance.  He wishes to avoid sexual side effects and so we are keeping the SSRI at the lowest possible dose.  He is tried all of the reasonable SSRI  options with the exception possibly of sertraline but it is likely to have more sexual dysfunction than what he is taking now.  When travels then tends to have less OCD bc triggered less.  As discussed  fortunately his anxiety has improved somewhat over time since we increased the Lexapro the last time.  Overall balance is ok between benefit and SE Lexapro for OCD at 10 mg daily.  Asks about increasing Lexapro for worry.  No bc this increase worry may be temporary based on past history of symptoms being worse in November and also because of the question about it being related to the timing of Ritalin wearing off.  Need to answer that question first..    His OCD is persistent with checking lites, stove, etc. but it is not heavily time-consuming and is mildly to moderately distressing.  Overall his level of depression is moderate.  He is able to find things that he enjoys and he does have interests but they are reduced below normal for him.Marland Kitchen.  He is probably too inactive and not as motivated as he should be which is part of his reason for wanting to increase the Ritalin.  Discussed potential benefits, risks, and side effects of stimulants with patient to include increased heart rate, palpitations, insomnia, increased anxiety, increased irritability, or decreased appetite.  Instructed patient to contact office if experiencing any  significant tolerability issues. Disc risk of increasing the Ritalin further elevating BP and pulse if we increase the Ritalin.  Need to verify that it's not markedly elevated from taking the Ritalin. Doesn't think it's been consistently elevated. Disc crash risk.  He doesn't need feel it.   Option reduce Ritalin to lessen the worry when it wears off.   He feels that this is helpful. Thinks memory problem relate to OCD bc often counting in the in the background at rest which tends to reduce his attention.  Encourage walking and disc dieting for wieght loss.  Disc need for weight  loss. Current #[id number].     Disc standard evaluation of fatigue. Send copies of labs to me.  Before his next appt with me with [facility].  No problem with the switch back to ropinirole. RLS managed.  Still no complaints.  Discussed cognitive behavioral techniques for managing both depressive and anxiety symptoms.  For example discussed delay in giving into the compulsion as a form of extinction and response prevention.  Discussed his delayed sleep phase syndrome which currently is only a problem because it is bothering his wife.  However this is fairly chronic and not changing.  Option Rexulti.   He has resumed lithium 150 mg daily for its neuro protective effect.  He remains concerned about his short-term memory as previously noted  No med changes today except OK to reduce Ritalin and see if PM worry is better.  Follow-up 3 weeks  Lynder Parents MD, DFAPA. Please see After Visit Summary for patient specific instructions.  Future Appointments  Date Time Provider [place]  01/22/2019  1:00 PM Cottle, [place]ley [place]., MD [facility] None    No orders of the defined types were placed in this encounter.     -------------------------------

## 2019-01-09 DIAGNOSIS — Z125 Encounter for screening for malignant neoplasm of prostate: Secondary | ICD-10-CM | POA: Diagnosis not present

## 2019-01-09 DIAGNOSIS — Z6839 Body mass index (BMI) 39.0-39.9, adult: Secondary | ICD-10-CM | POA: Diagnosis not present

## 2019-01-09 DIAGNOSIS — Z13220 Encounter for screening for lipoid disorders: Secondary | ICD-10-CM | POA: Diagnosis not present

## 2019-01-09 DIAGNOSIS — Z131 Encounter for screening for diabetes mellitus: Secondary | ICD-10-CM | POA: Diagnosis not present

## 2019-01-09 DIAGNOSIS — Z79899 Other long term (current) drug therapy: Secondary | ICD-10-CM | POA: Diagnosis not present

## 2019-01-13 DIAGNOSIS — Z0000 Encounter for general adult medical examination without abnormal findings: Secondary | ICD-10-CM | POA: Diagnosis not present

## 2019-01-22 ENCOUNTER — Encounter: Payer: Self-pay | Admitting: Psychiatry

## 2019-01-22 ENCOUNTER — Ambulatory Visit (INDEPENDENT_AMBULATORY_CARE_PROVIDER_SITE_OTHER): Payer: No Typology Code available for payment source | Admitting: Psychiatry

## 2019-01-22 DIAGNOSIS — G2581 Restless legs syndrome: Secondary | ICD-10-CM

## 2019-01-22 DIAGNOSIS — F331 Major depressive disorder, recurrent, moderate: Secondary | ICD-10-CM | POA: Diagnosis not present

## 2019-01-22 DIAGNOSIS — F422 Mixed obsessional thoughts and acts: Secondary | ICD-10-CM | POA: Diagnosis not present

## 2019-01-22 DIAGNOSIS — G4733 Obstructive sleep apnea (adult) (pediatric): Secondary | ICD-10-CM | POA: Diagnosis not present

## 2019-01-22 DIAGNOSIS — F5221 Male erectile disorder: Secondary | ICD-10-CM

## 2019-01-22 DIAGNOSIS — R7989 Other specified abnormal findings of blood chemistry: Secondary | ICD-10-CM

## 2019-01-22 NOTE — Progress Notes (Signed)
THERIN VETSCH [id number] [birth year] 68 y.o.   Virtual Visit via [facility]  I connected with pt by WebEx and verified that I am speaking with the correct person using two identifiers.   I discussed the limitations, risks, security and privacy concerns of performing an evaluation and management service by Jackqul[facility] and the availability of in person appointments. I also discussed with the patient that there may be a patient responsible charge related to this service. The patient expressed understanding and agreed to proceed.  I discussed the assessment and treatment plan with the patient. The patient was provided an opportunity to ask questions and all were answered. The patient agreed with the plan and demonstrated an understanding of the instructions.   The patient was advised to call back or seek an in-person evaluation if the symptoms worsen or if the condition fails to improve as anticipated.  I provided 30 minutes of video time during this encounter. The call started at 100 and ended at 1:30. The patient was located at home and the provider was located office.   Subjective:   Patient ID:  Eric [name] is a 68 y.o. (DOB [birth year]) male.  Chief Complaint:  Chief Complaint  Patient presents with  . Follow-up    Medication Management  . Depression    Medication Management  . Other    Medication Management    Depression        Associated symptoms include fatigue and myalgias.  Associated symptoms include no decreased concentration, no headaches and no suicidal ideas.  Past medical history includes anxiety.   Medication Refill Associated symptoms include arthralgias, fatigue and myalgias. Pertinent negatives include no headaches or weakness.  Anxiety Symptoms include nervous/anxious behavior. Patient reports no confusion, decreased concentration, dizziness, shortness of breath or suicidal ideas.      Otelia Limes Baranski presents for WebEx telemedicine today for  follow-up of OCD and depression and med changes.  visit November 27, 2018.   No improvement in energy of lithium and it was recommended that he restart lithium 150 mg daily for his neuro protective effect.  visit December 11, 2018.  No meds were changed.  He was satisfied with the meds currently prescribed.  Last visit December 23, 2018. No med changes except OK to reduce Ritalin and see if PM worry is better.   Disc labs MCV a little high.  MCH a little high.  Glu 104.  EGFR slightly low.  Lipid panel, PSA all OK.  A1C 6.1.  TSH WNL. 2.94.  Disc fatigue with PCP.  Exercise about 3 times weekly with trainer for 30 mins-45 mins.  Watch says he has fragmented sleep.  Dr. Maxwell Caul says CPAP data looks pretty good.    Pretty good but not great.  OCD and worry seem worse in evening when the Ritalin is wearing off he thinks.  W noted December is usually a bad month for him DT anniversary issues.   Overall seems fairly well.  Residual OCD.  OCD manageable generally.  Not overly anxious over Covid but wife is somewhat.     Disc Ritalin and he thinks it's helpful but makes him fidgety SE.  Legs are jumping. No worsening anxiety.  Still some general malaise.    D and 2 gkids in [place] but trip will be cancelled soon.   Can do video session while he's away.  Taking Ritalin just once daily bc gets up late.  Sig delayed sleep cycle.  Average 8 but sometimes only 6  hours like last night.    Sexual function is OK if he waits long enough between attempts.  Also disc effects of age and testosterone.  Disc risk of testosterone.  Manageable OCD at the lower dose of Lexapro.  Still issues with light switches.  After longer period with less Lexapro he's had a noticed a little more obsessing but managed.  A little worsening OCD about the light switches.  But it is manageable.  Still worry over Covid but does not exacerbate OCD.   Interest and enjoyment is reduced.  Too much time on the computer but tolerable bc  interest in sex is stil high and function good. Still CO fatigue.    Sleep varies with when he goes to bed.  Tends to stay up late.   Then sleeps late and some days as much is 12 hours daily.  Usually enough sleep unless he stays up too late.  Has checked his CPAP and his AHI is no more than 2.  Increased ropinirole to 3 mg nightly and thinks it has helped deepen sleep.  Worrying about light switches and counting is worse more than usual but otherwise the OCD is managed.     RLS managed ok unless stays up too late.  Sleep good without trouble except as noted. .  Caffeine varies from none to 5 cups.  Infrequent Xanax.   [name] in [place] state.    Prior psychiatric medication trials include Lexapro, citalopram NR, clomipramine weight gain, paroxetine, fluoxetine, Luvox, Trintellix,   bupropion, Abilify 10 fatigue, Cerefolin NAC, and modafinil and Nuvigil, pramipexole,  remotely took Adderall and Ritalin. Increase Lexapro back to 20 mg January.  Review of Systems:  Review of Systems  Constitutional: Positive for fatigue.  Respiratory: Negative for shortness of breath.   Musculoskeletal: Positive for arthralgias and myalgias.  Neurological: Negative for dizziness, tremors, weakness and headaches.  Psychiatric/Behavioral: Positive for depression and dysphoric mood. Negative for agitation, behavioral problems, confusion, decreased concentration, hallucinations, self-injury, sleep disturbance and suicidal ideas. The patient is nervous/anxious. The patient is not hyperactive.     Medications: I have reviewed the patient's current medications.  Current Outpatient Medications  Medication Sig Dispense Refill  . ALPRAZolam (XANAX) 0.25 MG tablet Take 0.25 mg by mouth 2 (two) times daily as needed for anxiety or sleep.    Marland Kitchen aspirin 81 MG tablet Take 81 mg by mouth daily.    Marland Kitchen buPROPion (WELLBUTRIN XL) 150 MG 24 hr tablet TAKE 3 TABLETS (450 MG TOTAL) BY MOUTH DAILY. 270 tablet 0  . Cholecalciferol  (VITAMIN D-3) 5000 units TABS Take 5,000 Units by mouth daily.     . Coenzyme Q10 200 MG TABS Take 300 mg by mouth daily.     . CRESTOR 5 MG tablet Take 5 mg by mouth daily.  2  . Cyanocobalamin (VITAMIN B 12) 500 MCG TABS Take 1,000 mcg by mouth.     . desonide (DESOWEN) 0.05 % ointment   0  . econazole nitrate 1 % cream Apply topically daily.    Marland Kitchen escitalopram (LEXAPRO) 20 MG tablet TAKE 1 TABLET BY MOUTH EVERY DAY (Patient taking differently: 10 mg. ) 90 tablet 0  . lactase (LACTAID) 3000 units tablet Take 3,000 Units by mouth as needed.     . lithium carbonate 150 MG capsule Take 1 capsule (150 mg total) by mouth at bedtime. 90 capsule 1  . methylphenidate (RITALIN) 10 MG tablet 4 tablet in the morning and 3 at noon (Patient taking  differently: 3 tablet in the morning and 3 at noon) 210 tablet 0  . mupirocin ointment (BACTROBAN) 2 %   1  . risperiDONE (RISPERDAL) 0.25 MG tablet Take 0.25 mg by mouth daily as needed (tends to brighten mood when needed).     Marland Kitchen rOPINIRole (REQUIP) 1 MG tablet TAKE 3 TABLETS (3 MG TOTAL) BY MOUTH AT BEDTIME. (Patient taking differently: Take 3 mg by mouth at bedtime. ) 270 tablet 0   No current facility-administered medications for this visit.    Medication Side Effects: None sexual SE are better not  All gone.  Allergies:  Allergies  Allergen Reactions  . Erythromycin Hives    Past Medical History:  Diagnosis Date  . Allergy   . Anemia    iron  . Carotid artery occlusion   . Hyperlipidemia   . Obesity   . OCD (obsessive compulsive disorder)   . Periodic limb movement disorder   . Sleep apnea     Family History  Problem Relation Age of Onset  . Cancer Mother        breast and ovarian  . Depression Father        bi-polar    Social History   Socioeconomic History  . Marital status: Married    Spouse name: Not on file  . Number of children: Not on file  . Years of education: Not on file  . Highest education level: Not on file   Occupational History  . Not on file  Tobacco Use  . Smoking status: Never Smoker  . Smokeless tobacco: Never Used  Substance and Sexual Activity  . Alcohol use: No  . Drug use: No  . Sexual activity: Not on file  Other Topics Concern  . Not on file  Social History Narrative  . Not on file   Social Determinants of Health   Financial Resource Strain:   . Difficulty of Paying Living Expenses: Not on file  Food Insecurity:   . Worried About Charity fundraiser in the Last Year: Not on file  . Ran Out of Food in the Last Year: Not on file  Transportation Needs:   . Lack of Transportation (Medical): Not on file  . Lack of Transportation (Non-Medical): Not on file  Physical Activity:   . Days of Exercise per Week: Not on file  . Minutes of Exercise per Session: Not on file  Stress:   . Feeling of Stress : Not on file  Social Connections:   . Frequency of Communication with Friends and Family: Not on file  . Frequency of Social Gatherings with Friends and Family: Not on file  . Attends Religious Services: Not on file  . Active Member of Clubs or Organizations: Not on file  . Attends Archivist Meetings: Not on file  . Marital Status: Not on file  Intimate Partner Violence:   . Fear of Current or Ex-Partner: Not on file  . Emotionally Abused: Not on file  . Physically Abused: Not on file  . Sexually Abused: Not on file    Past Medical History, Surgical history, Social history, and Family history were reviewed and updated as appropriate.   Please see review of systems for further details on the patient's review from today.   Objective:   Physical Exam:  There were no vitals taken for this visit.  Physical Exam Neurological:     Mental Status: He is alert and oriented to person, place, and time.  Cranial Nerves: No dysarthria.     Motor: No tremor.  Psychiatric:        Attention and Perception: Attention normal. He does not perceive auditory  hallucinations.        Mood and Affect: Mood is anxious and depressed.        Speech: Speech normal. Speech is not slurred.        Behavior: Behavior is cooperative.        Thought Content: Thought content is not paranoid or delusional. Thought content does not include homicidal or suicidal ideation. Thought content does not include homicidal or suicidal plan.        Cognition and Memory: Cognition and memory normal.        Judgment: Judgment normal.     Comments: Obsessing but manageable. Anxiety is a little worse especially towards the evening and wonders if it is right related to timing of Ritalin wearing off    November 06, 2018: [place] Cog test in office within normal limits MMSE 28/30. Animal fluency 17 . (borderline) Taken as a whole, no indication to pursue neuropsychological testing.  Lab Review:  No results found for: NA, K, CL, CO2, GLUCOSE, BUN, CREATININE, CALCIUM, PROT, ALBUMIN, AST, ALT, ALKPHOS, BILITOT, GFRNONAA, GFRAA  No results found for: WBC, RBC, HGB, HCT, PLT, MCV, MCH, MCHC, RDW, LYMPHSABS, MONOABS, EOSABS, BASOSABS  No results found for: POCLITH, LITHIUM   No results found for: PHENYTOIN, PHENOBARB, VALPROATE, CBMZ  Vitamin D level acceptable at 54.5.    Echocardiogram is stable re: AVR over the last 8 years and not likely the cause of lethargy.  .res Assessment: Plan:    Raiyan was seen today for follow-up, depression and other.  Diagnoses and all orders for this visit:  Mixed obsessional thoughts and acts  Major depressive disorder, recurrent episode, moderate (HCC)  Obstructive sleep apnea  Restless legs syndrome (RLS)  Low vitamin D level  Erectile disorder, acquired, generalized, moderate    Greater than 50% of 30 min face to face time with patient was spent on counseling and coordination of care. We discussed Mr. Mccaffrey has a long history of depression and OCD which are partially controlled.  He requires frequent follow-up and his  request because of significant residual depression and anxiety and concerns about polypharmacy and he wants regular therapeutic advice on how to further reduce his OCD.  He believes the depression is a consequence of the residual OCD.  He has some compulsive checking and obsessions around the house maintenance.  He wishes to avoid sexual side effects and so we are keeping the SSRI at the lowest possible dose.  He is tried all of the reasonable SSRI options with the exception possibly of sertraline but it is likely to have more sexual dysfunction than what he is taking now.  When travels then tends to have less OCD bc triggered less.  As discussed  fortunately his anxiety has improved somewhat over time since we increased the Lexapro the last time.  Overall balance is ok between benefit and SE Lexapro for OCD at 10 mg daily.  His OCD is persistent with checking lites, stove, etc. but it is not heavily time-consuming and is mildly to moderately distressing. Continue Lexapro 10 daily  Overall his level of depression is moderate.  He is able to find things that he enjoys and he does have interests but they are reduced below normal for him.Marland Kitchen  He is probably too inactive and not as motivated as he should  be which is part of his reason for wanting to increase the Ritalin.  Discussed potential benefits, risks, and side effects of stimulants with patient to include increased heart rate, palpitations, insomnia, increased anxiety, increased irritability, or decreased appetite.  Instructed patient to contact office if experiencing any significant tolerability issues. Disc risk of increasing the Ritalin further elevating BP and pulse if we increase the Ritalin.  Need to verify that it's not markedly elevated from taking the Ritalin. Doesn't think it's been consistently elevated. Disc crash risk.  He doesn't need feel it.   Option reduce Ritalin to lessen the worry when it wears off.   He feels that this is  helpful. Thinks memory problem relate to OCD bc often counting in the in the background at rest which tends to reduce his attention.   Encourage walking and disc dieting for wieght loss.  Disc need for weight loss. Current #287.     Disc standard evaluation of fatigue. Send copies of labs to me.  Before his next appt with me with [facility].  No problem with the switch back to ropinirole. RLS managed.  Still no complaints. Disc also dx PLMS.    Discussed cognitive behavioral techniques for managing both depressive and anxiety symptoms.  For example discussed delay in giving into the compulsion as a form of extinction and response prevention.  Discussed his delayed sleep phase syndrome which currently is only a problem because it is bothering his wife.  However this is fairly chronic and not changing.  Option Rexulti.   Continue lithium 150 mg daily for its neuro protective effect.  He remains concerned about his short-term memory as previously noted  reduce Ritalin in PM  And worry is better generally in the evening than it was.   Follow-up 3 weeks  Lynder Parents MD, DFAPA.  Please see After Visit Summary for patient specific instructions.  Future Appointments  Date Time Provider [place]  02/12/2019  1:30 PM Cottle, Billey Co., MD [facility] None  03/05/2019  1:30 PM Cottle, Billey Co., MD [facility] None  03/26/2019  2:00 PM Cottle, Billey Co., MD [facility] None    No orders of the defined types were placed in this encounter.     -------------------------------

## 2019-02-03 DIAGNOSIS — D225 Melanocytic nevi of trunk: Secondary | ICD-10-CM | POA: Diagnosis not present

## 2019-02-03 DIAGNOSIS — Z23 Encounter for immunization: Secondary | ICD-10-CM | POA: Diagnosis not present

## 2019-02-03 DIAGNOSIS — L814 Other melanin hyperpigmentation: Secondary | ICD-10-CM | POA: Diagnosis not present

## 2019-02-03 DIAGNOSIS — L578 Other skin changes due to chronic exposure to nonionizing radiation: Secondary | ICD-10-CM | POA: Diagnosis not present

## 2019-02-12 ENCOUNTER — Encounter: Payer: Self-pay | Admitting: Psychiatry

## 2019-02-12 ENCOUNTER — Ambulatory Visit (INDEPENDENT_AMBULATORY_CARE_PROVIDER_SITE_OTHER): Payer: No Typology Code available for payment source | Admitting: Psychiatry

## 2019-02-12 VITALS — BP 124/74 | HR 73

## 2019-02-12 DIAGNOSIS — R7989 Other specified abnormal findings of blood chemistry: Secondary | ICD-10-CM

## 2019-02-12 DIAGNOSIS — Z23 Encounter for immunization: Secondary | ICD-10-CM | POA: Diagnosis not present

## 2019-02-12 DIAGNOSIS — G4733 Obstructive sleep apnea (adult) (pediatric): Secondary | ICD-10-CM

## 2019-02-12 DIAGNOSIS — F331 Major depressive disorder, recurrent, moderate: Secondary | ICD-10-CM | POA: Diagnosis not present

## 2019-02-12 DIAGNOSIS — G2581 Restless legs syndrome: Secondary | ICD-10-CM

## 2019-02-12 DIAGNOSIS — F5221 Male erectile disorder: Secondary | ICD-10-CM

## 2019-02-12 DIAGNOSIS — F422 Mixed obsessional thoughts and acts: Secondary | ICD-10-CM

## 2019-02-12 NOTE — Patient Instructions (Addendum)
L-Arginine 500 mg capsules 2 daily  Consider discussing gabapentin with [name] in place of ropinirole.

## 2019-02-12 NOTE — Progress Notes (Signed)
Eric [name] [id number] [birth year] 69 y.o.    Subjective:   Patient ID:  Eric [name] is a 69 y.o. (DOB 11-13-1950) male.  Chief Complaint:  Chief Complaint  Patient presents with  . Follow-up    Medication Management  . Other    OCD    Depression        Associated symptoms include fatigue and myalgias.  Associated symptoms include no decreased concentration, no headaches and no suicidal ideas.  Past medical history includes anxiety.   Medication Refill Associated symptoms include arthralgias, fatigue and myalgias. Pertinent negatives include no headaches or weakness.  Anxiety Symptoms include nervous/anxious behavior. Patient reports no confusion, decreased concentration, dizziness, shortness of breath or suicidal ideas.      Eric [name] presents for  for follow-up of OCD and depression and med changes.  visit November 27, 2018.   No improvement in energy of lithium and it was recommended that he restart lithium 150 mg daily for his neuro protective effect.  visit December 11, 2018.  No meds were changed.  He was satisfied with the meds currently prescribed.  visit December 23, 2018. No med changes except OK to reduce Ritalin and see if PM worry is better.   Disc labs MCV a little high.  MCH a little high.  Glu 104.  EGFR slightly low.  Lipid panel, PSA all OK.  A1C 6.1.  TSH WNL. 2.94.  Disc fatigue with PCP.  Recently a little worse with anxiety and OCD but manageable and mainly at night.  Not wanting to change the Lexapro bc sexual SE.  Exercise about 3 times weekly with trainer for 30 mins-45 mins.  Watch says he has fragmented sleep.  Dr. Maxwell Caul says CPAP data looks pretty good.    Pretty good but not great.  OCD and worry seem worse in evening when the Ritalin is wearing off he thinks.  W noted December is usually a bad month for him DT anniversary issues.   Overall seems fairly well.  Residual OCD.  OCD manageable generally.  Not overly  anxious over Covid but wife is somewhat.     Disc Ritalin and he thinks it's helpful for energy without SE.  Legs are jumping. No worsening anxiety.  Still some general malaise.    Primary stress D and 2 gkids in [place].   Can do video session while he's away.  Taking Ritalin 30 mg just once daily bc gets up late.  Sig delayed sleep cycle.  Average 8 but sometimes only 6 hours like last night.   Can find things he enjoys.  But not a lot of things.   Sexual function is OK if he waits long enough between attempts.  Also disc effects of age and testosterone.  Disc risk of testosterone.  Manageable OCD at the lower dose of Lexapro.  Still issues with light switches.  After longer period with less Lexapro he's had a noticed a little more obsessing but managed.  A little worsening OCD about the light switches.  But it is manageable.  Still worry over Covid but does not exacerbate OCD.   Interest and enjoyment is reduced.  Too much time on the computer but tolerable bc interest in sex is stil high and function good. Still CO fatigue.    Sleep varies with when he goes to bed.  Tends to stay up late.   May sleep excessively or not enough 12 hours daily.  Usually enough sleep and sets alarm  clock for 8 hours.unless he stays up too late.  Has checked his CPAP and his AHI is no more than 2.  Increased ropinirole to 3 mg nightly and thinks it has helped deepen sleep.  Worrying about light switches and counting is worse more than usual but otherwise the OCD is managed.     RLS managed ok unless stays up too late.  Sleep good without trouble except as noted. .  Caffeine varies from none to 5 cups.  Infrequent Xanax.   [name] in [place] state.    Prior psychiatric medication trials include Lexapro, citalopram NR, clomipramine weight gain, paroxetine, fluoxetine, Luvox, Trintellix,   bupropion, Abilify 10 fatigue, Cerefolin NAC, and modafinil and Nuvigil,  pramipexole,  remotely took Adderall and Ritalin. Increase  Lexapro back to 20 mg January.  Review of Systems:  Review of Systems  Constitutional: Positive for fatigue.  Respiratory: Negative for shortness of breath.   Musculoskeletal: Positive for arthralgias and myalgias.  Neurological: Negative for dizziness, tremors, weakness and headaches.  Psychiatric/Behavioral: Positive for depression and dysphoric mood. Negative for agitation, behavioral problems, confusion, decreased concentration, hallucinations, self-injury, sleep disturbance and suicidal ideas. The patient is nervous/anxious. The patient is not hyperactive.     Medications: I have reviewed the patient's current medications.  Current Outpatient Medications  Medication Sig Dispense Refill  . ALPRAZolam (XANAX) 0.25 MG tablet Take 0.25 mg by mouth 2 (two) times daily as needed for anxiety or sleep.    Marland Kitchen aspirin 81 MG tablet Take 81 mg by mouth daily.    Marland Kitchen buPROPion (WELLBUTRIN XL) 150 MG 24 hr tablet TAKE 3 TABLETS (450 MG TOTAL) BY MOUTH DAILY. 270 tablet 0  . Cholecalciferol (VITAMIN D-3) 5000 units TABS Take 5,000 Units by mouth daily.     . Coenzyme Q10 200 MG TABS Take 300 mg by mouth daily.     . CRESTOR 5 MG tablet Take 5 mg by mouth daily.  2  . Cyanocobalamin (VITAMIN B 12) 500 MCG TABS Take 1,000 mcg by mouth.     . desonide (DESOWEN) 0.05 % ointment   0  . econazole nitrate 1 % cream Apply topically daily.    Marland Kitchen escitalopram (LEXAPRO) 20 MG tablet TAKE 1 TABLET BY MOUTH EVERY DAY (Patient taking differently: 10 mg. ) 90 tablet 0  . lactase (LACTAID) 3000 units tablet Take 3,000 Units by mouth as needed.     . lithium carbonate 150 MG capsule Take 1 capsule (150 mg total) by mouth at bedtime. 90 capsule 1  . methylphenidate (RITALIN) 10 MG tablet 4 tablet in the morning and 3 at noon (Patient taking differently: 3 tablet in the morning and 3 at noon) 210 tablet 0  . mupirocin ointment (BACTROBAN) 2 %   1  . risperiDONE (RISPERDAL) 0.25 MG tablet Take 0.25 mg by mouth daily as  needed (tends to brighten mood when needed).     Marland Kitchen rOPINIRole (REQUIP) 1 MG tablet TAKE 3 TABLETS (3 MG TOTAL) BY MOUTH AT BEDTIME. (Patient taking differently: Take 3 mg by mouth at bedtime. ) 270 tablet 0   No current facility-administered medications for this visit.    Medication Side Effects: None sexual SE are better not  All gone.  Allergies:  Allergies  Allergen Reactions  . Erythromycin Hives    Past Medical History:  Diagnosis Date  . Allergy   . Anemia    iron  . Carotid artery occlusion   . Hyperlipidemia   . Obesity   .  OCD (obsessive compulsive disorder)   . Periodic limb movement disorder   . Sleep apnea     Family History  Problem Relation Age of Onset  . Cancer Mother        breast and ovarian  . Depression Father        bi-polar    Social History   Socioeconomic History  . Marital status: Married    Spouse name: Not on file  . Number of children: Not on file  . Years of education: Not on file  . Highest education level: Not on file  Occupational History  . Not on file  Tobacco Use  . Smoking status: Never Smoker  . Smokeless tobacco: Never Used  Substance and Sexual Activity  . Alcohol use: No  . Drug use: No  . Sexual activity: Not on file  Other Topics Concern  . Not on file  Social History Narrative  . Not on file   Social Determinants of Health   Financial Resource Strain:   . Difficulty of Paying Living Expenses: Not on file  Food Insecurity:   . Worried About Charity fundraiser in the Last Year: Not on file  . Ran Out of Food in the Last Year: Not on file  Transportation Needs:   . Lack of Transportation (Medical): Not on file  . Lack of Transportation (Non-Medical): Not on file  Physical Activity:   . Days of Exercise per Week: Not on file  . Minutes of Exercise per Session: Not on file  Stress:   . Feeling of Stress : Not on file  Social Connections:   . Frequency of Communication with Friends and Family: Not on file   . Frequency of Social Gatherings with Friends and Family: Not on file  . Attends Religious Services: Not on file  . Active Member of Clubs or Organizations: Not on file  . Attends Archivist Meetings: Not on file  . Marital Status: Not on file  Intimate Partner Violence:   . Fear of Current or Ex-Partner: Not on file  . Emotionally Abused: Not on file  . Physically Abused: Not on file  . Sexually Abused: Not on file    Past Medical History, Surgical history, Social history, and Family history were reviewed and updated as appropriate.   Please see review of systems for further details on the patient's review from today.   Objective:   Physical Exam:  BP 124/74   Pulse 73   Physical Exam Neurological:     Mental Status: He is alert and oriented to person, place, and time.     Cranial Nerves: No dysarthria.     Motor: No tremor.  Psychiatric:        Attention and Perception: Attention normal. He does not perceive auditory hallucinations.        Mood and Affect: Mood is anxious and depressed.        Speech: Speech normal. Speech is not slurred.        Behavior: Behavior is cooperative.        Thought Content: Thought content is not paranoid or delusional. Thought content does not include homicidal or suicidal ideation. Thought content does not include homicidal or suicidal plan.        Cognition and Memory: Cognition and memory normal.        Judgment: Judgment normal.     Comments: Obsessing but manageable. Anxiety is a little worse especially towards the evening and wonders if  it is right related to timing of Ritalin wearing off    November 06, 2018: [place] Cog test in office within normal limits MMSE 28/30. Animal fluency 17 . (borderline) Taken as a whole, no indication to pursue neuropsychological testing.  Lab Review:  No results found for: NA, K, CL, CO2, GLUCOSE, BUN, CREATININE, CALCIUM, PROT, ALBUMIN, AST, ALT, ALKPHOS, BILITOT, GFRNONAA, GFRAA  No  results found for: WBC, RBC, HGB, HCT, PLT, MCV, MCH, MCHC, RDW, LYMPHSABS, MONOABS, EOSABS, BASOSABS  No results found for: POCLITH, LITHIUM   No results found for: PHENYTOIN, PHENOBARB, VALPROATE, CBMZ  Vitamin D level acceptable at 54.5.    Echocardiogram is stable re: AVR over the last 8 years and not likely the cause of lethargy.  .res Assessment: Plan:    Eric [name] was seen today for follow-up and other.  Diagnoses and all orders for this visit:  Mixed obsessional thoughts and acts  Major depressive disorder, recurrent episode, moderate (HCC)  Obstructive sleep apnea  Restless legs syndrome (RLS)  Low vitamin D level  Erectile disorder, acquired, generalized, moderate    Greater than 50% of 30 min face to face time with patient was spent on counseling and coordination of care. We discussed Eric [name] has a long history of depression and OCD which are partially controlled.  He requires frequent follow-up and his request because of significant residual depression and anxiety and concerns about polypharmacy and he wants regular therapeutic advice on how to further reduce his OCD.  He believes the depression is a consequence of the residual OCD.  He has some compulsive checking and obsessions around the house maintenance.  He wishes to avoid sexual side effects and so we are keeping the SSRI at the lowest possible dose.  He is tried all of the reasonable SSRI options with the exception possibly of sertraline but it is likely to have more sexual dysfunction than what he is taking now.  When travels then tends to have less OCD bc triggered less.  As discussed  fortunately his anxiety has improved somewhat over time since we increased the Lexapro the last time.  Overall balance is ok between benefit and SE Lexapro for OCD at 10 mg daily.  His OCD is persistent with checking lites, stove, etc. but it is not heavily time-consuming and is mildly to moderately distressing. Continue  Lexapro 10 daily  Overall his level of depression is moderate.  He is able to find things that he enjoys and he does have interests but they are reduced below normal for him.Marland Kitchen  He is probably too inactive and not as motivated as he should be which is part of his reason for wanting to increase the Ritalin.  Discussed potential benefits, risks, and side effects of stimulants with patient to include increased heart rate, palpitations, insomnia, increased anxiety, increased irritability, or decreased appetite.  Instructed patient to contact office if experiencing any significant tolerability issues. Disc risk of increasing the Ritalin further elevating BP and pulse if we increase the Ritalin.  Need to verify that it's not markedly elevated from taking the Ritalin. Doesn't think it's been consistently elevated. Disc crash risk.  He doesn't need feel it.   Option reduce Ritalin to lessen the worry when it wears off.   He feels that this is helpful. Thinks memory problem relate to OCD bc often counting in the in the background at rest which tends to reduce his attention.   Encourage walking and disc dieting for wieght loss.  Disc  need for weight loss. Current #287.     Disc standard evaluation of fatigue. Send copies of labs to me.  Before his next appt with me with [facility].  No problem with the switch back to ropinirole. RLS managed.  Still no complaints. Disc also dx PLMS.    Discussed cognitive behavioral techniques for managing both depressive and anxiety symptoms.  For example discussed delay in giving into the compulsion as a form of extinction and response prevention.  Discussed his delayed sleep phase syndrome which currently is only a problem because it is bothering his wife.  However this is fairly chronic and not changing.  Option Rexulti.   Continue lithium 150 mg daily for its neuro protective effect.  He remains concerned about his short-term memory as previously noted  reduce  Ritalin in PM  And worry is better generally in the evening than it was.   Follow-up 3 weeks  Eric Parents MD, DFAPA.  Please see After Visit Summary for patient specific instructions.  Future Appointments  Date Time Provider [place]  03/05/2019  1:30 PM Cottle, Billey Co., MD [facility] None  03/26/2019  2:00 PM Cottle, Billey Co., MD [facility] None  04/16/2019  1:00 PM Cottle, Billey Co., MD [facility] None  05/07/2019  1:30 PM Cottle, Billey Co., MD [facility] None    No orders of the defined types were placed in this encounter.     -------------------------------

## 2019-02-27 ENCOUNTER — Other Ambulatory Visit: Payer: Self-pay | Admitting: Psychiatry

## 2019-03-02 ENCOUNTER — Ambulatory Visit: Payer: Self-pay

## 2019-03-05 ENCOUNTER — Encounter: Payer: Self-pay | Admitting: Psychiatry

## 2019-03-05 ENCOUNTER — Ambulatory Visit (INDEPENDENT_AMBULATORY_CARE_PROVIDER_SITE_OTHER): Payer: No Typology Code available for payment source | Admitting: Psychiatry

## 2019-03-05 ENCOUNTER — Other Ambulatory Visit: Payer: Self-pay

## 2019-03-05 DIAGNOSIS — G2581 Restless legs syndrome: Secondary | ICD-10-CM | POA: Diagnosis not present

## 2019-03-05 DIAGNOSIS — F5221 Male erectile disorder: Secondary | ICD-10-CM

## 2019-03-05 DIAGNOSIS — R7989 Other specified abnormal findings of blood chemistry: Secondary | ICD-10-CM | POA: Diagnosis not present

## 2019-03-05 DIAGNOSIS — F422 Mixed obsessional thoughts and acts: Secondary | ICD-10-CM | POA: Diagnosis not present

## 2019-03-05 DIAGNOSIS — Z23 Encounter for immunization: Secondary | ICD-10-CM | POA: Diagnosis not present

## 2019-03-05 DIAGNOSIS — G4733 Obstructive sleep apnea (adult) (pediatric): Secondary | ICD-10-CM

## 2019-03-05 DIAGNOSIS — F331 Major depressive disorder, recurrent, moderate: Secondary | ICD-10-CM

## 2019-03-05 NOTE — Progress Notes (Signed)
Eric [name] [id number] January 14, 1951 69 y.o.    Subjective:   Patient ID:  Eric [name] is a 69 y.o. (DOB Sep 21, 1950) male.  Chief Complaint:  No chief complaint on file.   Depression        Associated symptoms include fatigue and myalgias.  Associated symptoms include no decreased concentration, no headaches and no suicidal ideas.  Past medical history includes anxiety.   Medication Refill Associated symptoms include arthralgias, fatigue and myalgias. Pertinent negatives include no headaches or weakness.  Anxiety Symptoms include nervous/anxious behavior. Patient reports no confusion, decreased concentration, dizziness, shortness of breath or suicidal ideas.      [name] Arora presents for  for follow-up of OCD and depression and med changes.  visit November 27, 2018.   No improvement in energy of lithium and it was recommended that he restart lithium 150 mg daily for his neuro protective effect.  visit December 11, 2018.  No meds were changed.  He was satisfied with the meds currently prescribed.  Last seen February 12, 2019 . No med changes.  Xanax only used 1-2 times/month.  Overall pretty good with better energy and getting to bed earlier and that helped.  Takes Ritalin several days per week.  Seems to help and lasts longer than 6 hours.  Depression manageable.  Feels uneasy late in evenings due to OCD and still checks.  A bit more productive and wife likes that too.  If it weren't for sexual SE would increase it.  No sometimes has ED.  Trainer recommending a colon cleanse.  Disc risk diarrhea affecting med absorption.  Also risk of anxiety with those meds.    To [facility] March 11-23.  Disc Covid concerns.    Exercise about 3 times weekly with trainer for 30 mins-45 mins.  Watch says he has fragmented sleep.  [name] says CPAP data looks pretty good.    Pretty good but not great.  OCD and worry seem worse in evening when the Ritalin is wearing off he  thinks.  W noted December is usually a bad month for him DT anniversary issues.   Overall seems fairly well.  Residual OCD.  OCD manageable generally.  Not overly anxious over Covid but wife is somewhat.     Disc Ritalin and he thinks it's helpful for energy without SE.  Legs are jumping. No worsening anxiety.  Still some general malaise.    Primary stress D and 2 gkids in [place].   Can do video session while he's away.  Taking Ritalin 30 mg just once daily bc gets up late.  Sig delayed sleep cycle.  Average 8 but sometimes only 6 hours like last night.   Can find things he enjoys.  But not a lot of things.   Sexual function is OK if he waits long enough between attempts.  Also disc effects of age and testosterone.  Disc risk of testosterone.  Manageable OCD at the lower dose of Lexapro.  Still issues with light switches.  After longer period with less Lexapro he's had a noticed a little more obsessing but managed.  A little worsening OCD about the light switches.  But it is manageable.  Still worry over Covid but does not exacerbate OCD.   Interest and enjoyment is reduced.  Too much time on the computer but tolerable bc interest in sex is stil high and function good. Still CO fatigue.    Sleep varies with when he goes to bed.  Tends  to stay up late.   May sleep excessively or not enough 12 hours daily.  Usually enough sleep and sets alarm clock for 8 hours.unless he stays up too late.  Has checked his CPAP and his AHI is no more than 2.  Increased ropinirole to 3 mg nightly and thinks it has helped deepen sleep.  Worrying about light switches and counting is worse more than usual but otherwise the OCD is managed.     RLS managed ok unless stays up too late.  Sleep good without trouble except as noted. .  Caffeine varies from none to 5 cups.  Infrequent Xanax.   [name] in [place] state.    Prior psychiatric medication trials include Lexapro, citalopram NR, clomipramine weight gain, paroxetine,  fluoxetine, Luvox, Trintellix,   bupropion, Abilify 10 fatigue, Cerefolin NAC, and modafinil and Nuvigil,  pramipexole,  remotely took Adderall and Ritalin. Increase Lexapro back to 20 mg January.  Review of Systems:  Review of Systems  Constitutional: Positive for fatigue.  Respiratory: Negative for shortness of breath.   Musculoskeletal: Positive for arthralgias and myalgias.  Neurological: Negative for dizziness, tremors, weakness and headaches.  Psychiatric/Behavioral: Positive for depression and dysphoric mood. Negative for agitation, behavioral problems, confusion, decreased concentration, hallucinations, self-injury, sleep disturbance and suicidal ideas. The patient is nervous/anxious. The patient is not hyperactive.     Medications: I have reviewed the patient's current medications.  Current Outpatient Medications  Medication Sig Dispense Refill  . ALPRAZolam (XANAX) 0.25 MG tablet Take 0.25 mg by mouth 2 (two) times daily as needed for anxiety or sleep.    Marland Kitchen aspirin 81 MG tablet Take 81 mg by mouth daily.    Marland Kitchen buPROPion (WELLBUTRIN XL) 150 MG 24 hr tablet TAKE 3 TABLETS (450 MG TOTAL) BY MOUTH DAILY. 270 tablet 0  . Cholecalciferol (VITAMIN D-3) 5000 units TABS Take 5,000 Units by mouth daily.     . Coenzyme Q10 200 MG TABS Take 300 mg by mouth daily.     . CRESTOR 5 MG tablet Take 5 mg by mouth daily.  2  . Cyanocobalamin (VITAMIN B 12) 500 MCG TABS Take 1,000 mcg by mouth.     . desonide (DESOWEN) 0.05 % ointment   0  . econazole nitrate 1 % cream Apply topically daily.    Marland Kitchen escitalopram (LEXAPRO) 20 MG tablet TAKE 1 TABLET BY MOUTH EVERY DAY (Patient taking differently: 10 mg. ) 90 tablet 0  . lactase (LACTAID) 3000 units tablet Take 3,000 Units by mouth as needed.     . lithium carbonate 150 MG capsule Take 1 capsule (150 mg total) by mouth at bedtime. 90 capsule 1  . methylphenidate (RITALIN) 10 MG tablet 4 tablet in the morning and 3 at noon (Patient taking differently:  3 tablet in the morning and 3 at noon) 210 tablet 0  . mupirocin ointment (BACTROBAN) 2 %   1  . risperiDONE (RISPERDAL) 0.25 MG tablet Take 0.25 mg by mouth daily as needed (tends to brighten mood when needed).     Marland Kitchen rOPINIRole (REQUIP) 1 MG tablet TAKE 3 TABLETS (3 MG TOTAL) BY MOUTH AT BEDTIME. 270 tablet 0   No current facility-administered medications for this visit.    Medication Side Effects: None sexual SE are better not  All gone.  Allergies:  Allergies  Allergen Reactions  . Erythromycin Hives    Past Medical History:  Diagnosis Date  . Allergy   . Anemia    iron  . Carotid  artery occlusion   . Hyperlipidemia   . Obesity   . OCD (obsessive compulsive disorder)   . Periodic limb movement disorder   . Sleep apnea     Family History  Problem Relation Age of Onset  . Cancer Mother        breast and ovarian  . Depression Father        bi-polar    Social History   Socioeconomic History  . Marital status: Married    Spouse name: Not on file  . Number of children: Not on file  . Years of education: Not on file  . Highest education level: Not on file  Occupational History  . Not on file  Tobacco Use  . Smoking status: Never Smoker  . Smokeless tobacco: Never Used  Substance and Sexual Activity  . Alcohol use: No  . Drug use: No  . Sexual activity: Not on file  Other Topics Concern  . Not on file  Social History Narrative  . Not on file   Social Determinants of Health   Financial Resource Strain:   . Difficulty of Paying Living Expenses: Not on file  Food Insecurity:   . Worried About Charity fundraiser in the Last Year: Not on file  . Ran Out of Food in the Last Year: Not on file  Transportation Needs:   . Lack of Transportation (Medical): Not on file  . Lack of Transportation (Non-Medical): Not on file  Physical Activity:   . Days of Exercise per Week: Not on file  . Minutes of Exercise per Session: Not on file  Stress:   . Feeling of Stress  : Not on file  Social Connections:   . Frequency of Communication with Friends and Family: Not on file  . Frequency of Social Gatherings with Friends and Family: Not on file  . Attends Religious Services: Not on file  . Active Member of Clubs or Organizations: Not on file  . Attends Archivist Meetings: Not on file  . Marital Status: Not on file  Intimate Partner Violence:   . Fear of Current or Ex-Partner: Not on file  . Emotionally Abused: Not on file  . Physically Abused: Not on file  . Sexually Abused: Not on file    Past Medical History, Surgical history, Social history, and Family history were reviewed and updated as appropriate.   Please see review of systems for further details on the patient's review from today.   Objective:   Physical Exam:  There were no vitals taken for this visit.  Physical Exam Constitutional:      General: He is not in acute distress.    Appearance: He is well-developed.  Musculoskeletal:        General: No deformity.  Neurological:     Mental Status: He is alert and oriented to person, place, and time.     Cranial Nerves: No dysarthria.     Motor: No tremor.     Coordination: Coordination normal.  Psychiatric:        Attention and Perception: Attention and perception normal. He does not perceive auditory or visual hallucinations.        Mood and Affect: Mood is anxious and depressed. Affect is not labile, blunt, angry or inappropriate.        Speech: Speech normal. Speech is not slurred.        Behavior: Behavior normal. Behavior is cooperative.        Thought Content: Thought  content normal. Thought content is not paranoid or delusional. Thought content does not include homicidal or suicidal ideation. Thought content does not include homicidal or suicidal plan.        Cognition and Memory: Cognition and memory normal.        Judgment: Judgment normal.     Comments: Obsessing but manageable. Anxiety is a little worse especially  towards the evening and wonders if it is right related to timing of Ritalin wearing off    November 06, 2018: [place] Cog test in office within normal limits MMSE 28/30. Animal fluency 17 . (borderline) Taken as a whole, no indication to pursue neuropsychological testing.  Lab Review:  No results found for: NA, K, CL, CO2, GLUCOSE, BUN, CREATININE, CALCIUM, PROT, ALBUMIN, AST, ALT, ALKPHOS, BILITOT, GFRNONAA, GFRAA  No results found for: WBC, RBC, HGB, HCT, PLT, MCV, MCH, MCHC, RDW, LYMPHSABS, MONOABS, EOSABS, BASOSABS  No results found for: POCLITH, LITHIUM   No results found for: PHENYTOIN, PHENOBARB, VALPROATE, CBMZ  Vitamin D level acceptable at 54.5.    Echocardiogram is stable re: AVR over the last 8 years and not likely the cause of lethargy.  .res Assessment: Plan:    Diagnoses and all orders for this visit:  Mixed obsessional thoughts and acts  Major depressive disorder, recurrent episode, moderate (HCC)  Restless legs syndrome (RLS)  Low vitamin D level  Obstructive sleep apnea  Erectile disorder, acquired, generalized, moderate    Greater than 50% of 30 min face to face time with patient was spent on counseling and coordination of care. We discussed Mr. Schellinger has a long history of depression and OCD which are partially controlled.  He requires frequent follow-up and his request because of significant residual depression and anxiety and concerns about polypharmacy and he wants regular therapeutic advice on how to further reduce his OCD.  He believes the depression is a consequence of the residual OCD.  He has some compulsive checking and obsessions around the house maintenance.  He wishes to avoid sexual side effects and so we are keeping the SSRI at the lowest possible dose.  He is tried all of the reasonable SSRI options with the exception possibly of sertraline but it is likely to have more sexual dysfunction than what he is taking now.  When travels then  tends to have less OCD bc triggered less.  As discussed  fortunately his anxiety has improved somewhat over time since we increased the Lexapro the last time.  Overall balance is ok between benefit and SE Lexapro for OCD at 10 mg daily.  His OCD is persistent with checking lites, stove, etc. but it is not heavily time-consuming and is mildly to moderately distressing. Continue Lexapro 10 daily he asks about skipping Lexapro for a week.  Discussed withdrawal and tachyphylaxis and strongly recommend against doing so.  He could skip it for a couple of days today with sexual side effects on rare occasions but not on a regular basis.  Overall his level of depression is moderate.  He is able to find things that he enjoys and he does have interests but they are reduced below normal for him.Marland Kitchen  He is probably too inactive and not as motivated as he should be which is part of his reason for wanting to increase the Ritalin.  Discussed potential benefits, risks, and side effects of stimulants with patient to include increased heart rate, palpitations, insomnia, increased anxiety, increased irritability, or decreased appetite.  Instructed patient to contact office  if experiencing any significant tolerability issues. Disc risk of increasing the Ritalin further elevating BP and pulse if we increase the Ritalin.  Need to verify that it's not markedly elevated from taking the Ritalin. Doesn't think it's been consistently elevated. Disc crash risk.  He doesn't need feel it.   Option reduce Ritalin to lessen the worry when it wears off.   He feels that this is helpful. Thinks memory problem relate to OCD bc often counting in the in the background at rest which tends to reduce his attention.   Encourage walking and disc dieting for wieght loss.  Disc need for weight loss. Current #287.     Option sildenafil but he doesn't want to do it.  Disc standard evaluation of fatigue. Send copies of labs to me.  Before his next  appt with me with [facility].  No problem with the switch back to ropinirole. RLS managed.  Still no complaints. Disc also dx PLMS.    Discussed cognitive behavioral techniques for managing both depressive and anxiety symptoms.  For example discussed delay in giving into the compulsion as a form of extinction and response prevention.  Discussed his delayed sleep phase syndrome which currently is only a problem because it is bothering his wife.  However this is fairly chronic and not changing.  Option Rexulti.   Continue lithium 150 mg daily for its neuro protective effect.  He remains concerned about his short-term memory as previously noted  reduce Ritalin in PM  And worry is better generally in the evening than it was.   Follow-up 3 weeks  Meredith Staggers MD, DFAPA.  Please see After Visit Summary for patient specific instructions.  Future Appointments  Date Time Provider Department Center  03/26/2019  2:00 PM Cottle, Steva Ready., MD [facility] None  04/16/2019  1:00 PM Cottle, Steva Ready., MD [facility] None  05/07/2019  1:30 PM Cottle, Steva Ready., MD [facility] None    No orders of the defined types were placed in this encounter.     -------------------------------

## 2019-03-26 ENCOUNTER — Ambulatory Visit (INDEPENDENT_AMBULATORY_CARE_PROVIDER_SITE_OTHER): Payer: No Typology Code available for payment source | Admitting: Psychiatry

## 2019-03-26 ENCOUNTER — Encounter: Payer: Self-pay | Admitting: Psychiatry

## 2019-03-26 DIAGNOSIS — R7989 Other specified abnormal findings of blood chemistry: Secondary | ICD-10-CM

## 2019-03-26 DIAGNOSIS — G2581 Restless legs syndrome: Secondary | ICD-10-CM

## 2019-03-26 DIAGNOSIS — F422 Mixed obsessional thoughts and acts: Secondary | ICD-10-CM | POA: Diagnosis not present

## 2019-03-26 DIAGNOSIS — F331 Major depressive disorder, recurrent, moderate: Secondary | ICD-10-CM

## 2019-03-26 DIAGNOSIS — G4733 Obstructive sleep apnea (adult) (pediatric): Secondary | ICD-10-CM

## 2019-03-26 DIAGNOSIS — F5221 Male erectile disorder: Secondary | ICD-10-CM

## 2019-03-26 MED ORDER — METHYLPHENIDATE HCL 10 MG PO TABS: ORAL_TABLET | ORAL | 0 refills | Status: DC

## 2019-03-26 MED ORDER — BUPROPION HCL ER (XL) 150 MG PO TB24: 450.0000 mg | ORAL_TABLET | Freq: Every day | ORAL | 1 refills | Status: DC

## 2019-03-26 NOTE — Progress Notes (Signed)
Eric [name] [id number] [birth year] 69 y.o.   Virtual Visit via [facility]  I connected with pt by WebEx and verified that I am speaking with the correct person using two identifiers.   I discussed the limitations, risks, security and privacy concerns of performing an evaluation and management service by [facility] and the availability of in person appointments. I also discussed with the patient that there may be a patient responsible charge related to this service. The patient expressed understanding and agreed to proceed.  I discussed the assessment and treatment plan with the patient. The patient was provided an opportunity to ask questions and all were answered. The patient agreed with the plan and demonstrated an understanding of the instructions.   The patient was advised to call back or seek an in-person evaluation if the symptoms worsen or if the condition fails to improve as anticipated.  I provided 30 minutes of video time during this encounter. The call started at 230 and ended at 3:00. The patient was located at home and the provider was located office.  Subjective:   Patient ID:  Eric [name] is a 69 y.o. (DOB 05/29/1950) male.  Chief Complaint:  Chief Complaint  Patient presents with  . Follow-up    Medication Management  . Other    OCD  . Anxiety    worse lately    Depression        Associated symptoms include fatigue and myalgias.  Associated symptoms include no decreased concentration, no headaches and no suicidal ideas.  Past medical history includes anxiety.   Medication Refill Associated symptoms include arthralgias, fatigue and myalgias. Pertinent negatives include no headaches or weakness.  Anxiety Symptoms include nervous/anxious behavior. Patient reports no confusion, decreased concentration, dizziness, shortness of breath or suicidal ideas.      Eric [name] presents for  for follow-up of OCD and depression and med changes.  visit  November 27, 2018.   No improvement in energy of lithium and it was recommended that he restart lithium 150 mg daily for his neuro protective effect.  visit December 11, 2018.  No meds were changed.  He was satisfied with the meds currently prescribed.  Last seen March 05, 2019 . No med changes except he was granted some flexibility around dosing of Ritalin..  Xanax only used 1-2 times/month. 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  Driven me crazy a little.  This is a trigger for OCD.  Xanax helped calm anxiety and help him to sleep.  Manageable OCD otherwise at the lower dose of Lexapro.  Still issues with light switches.  After longer period with less Lexapro he's had a noticed a little more obsessing but managed.  A little worsening OCD about the light switches.  But it is manageable.  Still worry over Covid but does not exacerbate OCD.   Going to [place] soon.  Has gotten both doses.  Wife also vaccinated.  To [facility] March 11-23.  Disc Covid concerns.   Overall pretty good with better energy and getting to bed earlier and that helped.  Takes Ritalin several days per week.  Seems to help and lasts longer than 6 hours.  Depression manageable.  Feels uneasy late in evenings due to OCD and still checks.  A bit more productive and wife likes that too.  If it weren't for sexual SE would increase it.  No sometimes has ED.   Trainer recommending a colon cleanse.  He did  not do it yet.  Disc risk diarrhea affecting med absorption.  Also risk of anxiety with those meds.    Exercise about 3 times weekly with trainer for 30 mins-45 mins.  Wife says he has fragmented sleep.  [name] says CPAP data looks pretty good.   Disc Ritalin and he thinks it's helpful for energy without SE. he feels he is a little more productive on Ritalin.  Legs are jumping. No worsening anxiety.  Still some general malaise.    Primary stress D and 2 gkids in [place].   Can do video session while he's  away.  Taking Ritalin 30 mg just once daily bc gets up late.  Sig delayed sleep cycle.  Average 8 but sometimes only 6 hours like last night.   Can find things he enjoys.  But not a lot of things.   Sexual function is OK if he waits long enough between attempts.  Also disc effects of age and testosterone.  Disc risk of testosterone. Interest and enjoyment is reduced.  Too much time on the computer but tolerable bc interest in sex is stil high and function good. Still CO fatigue.    RLS managed ok unless stays up too late.  Sleep good without trouble except as noted. .  Caffeine varies from none to 5 cups.  Infrequent Xanax.   [name] in [place] state.    Prior psychiatric medication trials include Lexapro, citalopram NR, clomipramine weight gain, paroxetine, fluoxetine, Luvox, Trintellix,   bupropion, Abilify 10 fatigue, Cerefolin NAC, and modafinil and Nuvigil,  pramipexole,  remotely took Adderall and Ritalin. Increase Lexapro back to 20 mg January.  Review of Systems:  Review of Systems  Constitutional: Positive for fatigue.  Respiratory: Negative for shortness of breath.   Musculoskeletal: Positive for arthralgias and myalgias.  Neurological: Negative for dizziness, tremors, weakness and headaches.  Psychiatric/Behavioral: Positive for depression and dysphoric mood. Negative for agitation, behavioral problems, confusion, decreased concentration, hallucinations, self-injury, sleep disturbance and suicidal ideas. The patient is nervous/anxious. The patient is not hyperactive.     Medications: I have reviewed the patient's current medications.  Current Outpatient Medications  Medication Sig Dispense Refill  . ALPRAZolam (XANAX) 0.25 MG tablet Take 0.25 mg by mouth 2 (two) times daily as needed for anxiety or sleep.    Marland Kitchen aspirin 81 MG tablet Take 81 mg by mouth daily.    Marland Kitchen buPROPion (WELLBUTRIN XL) 150 MG 24 hr tablet Take 3 tablets (450 mg total) by mouth daily. 270 tablet 1  .  Cholecalciferol (VITAMIN D-3) 5000 units TABS Take 5,000 Units by mouth daily.     . Coenzyme Q10 200 MG TABS Take 300 mg by mouth daily.     . CRESTOR 5 MG tablet Take 5 mg by mouth daily.  2  . Cyanocobalamin (VITAMIN B 12) 500 MCG TABS Take 1,000 mcg by mouth.     . desonide (DESOWEN) 0.05 % ointment   0  . econazole nitrate 1 % cream Apply topically daily.    Marland Kitchen escitalopram (LEXAPRO) 20 MG tablet TAKE 1 TABLET BY MOUTH EVERY DAY (Patient taking differently: 10 mg. ) 90 tablet 0  . lactase (LACTAID) 3000 units tablet Take 3,000 Units by mouth as needed.     . lithium carbonate 150 MG capsule Take 1 capsule (150 mg total) by mouth at bedtime. 90 capsule 1  . methylphenidate (RITALIN) 10 MG tablet 3 tablet in the morning and 3 at noon 180 tablet 0  .  mupirocin ointment (BACTROBAN) 2 %   1  . risperiDONE (RISPERDAL) 0.25 MG tablet Take 0.25 mg by mouth daily as needed (tends to brighten mood when needed).     Marland Kitchen rOPINIRole (REQUIP) 1 MG tablet TAKE 3 TABLETS (3 MG TOTAL) BY MOUTH AT BEDTIME. (Patient taking differently: Take 2 mg by mouth at bedtime. ) 270 tablet 0   No current facility-administered medications for this visit.    Medication Side Effects: None sexual SE are better not  All gone.  Allergies:  Allergies  Allergen Reactions  . Erythromycin Hives    Past Medical History:  Diagnosis Date  . Allergy   . Anemia    iron  . Carotid artery occlusion   . Hyperlipidemia   . Obesity   . OCD (obsessive compulsive disorder)   . Periodic limb movement disorder   . Sleep apnea     Family History  Problem Relation Age of Onset  . Cancer Mother        breast and ovarian  . Depression Father        bi-polar    Social History   Socioeconomic History  . Marital status: Married    Spouse name: Not on file  . Number of children: Not on file  . Years of education: Not on file  . Highest education level: Not on file  Occupational History  . Not on file  Tobacco Use  .  Smoking status: Never Smoker  . Smokeless tobacco: Never Used  Substance and Sexual Activity  . Alcohol use: No  . Drug use: No  . Sexual activity: Not on file  Other Topics Concern  . Not on file  Social History Narrative  . Not on file   Social Determinants of Health   Financial Resource Strain:   . Difficulty of Paying Living Expenses: Not on file  Food Insecurity:   . Worried About Programme researcher, broadcasting/film/video in the Last Year: Not on file  . Ran Out of Food in the Last Year: Not on file  Transportation Needs:   . Lack of Transportation (Medical): Not on file  . Lack of Transportation (Non-Medical): Not on file  Physical Activity:   . Days of Exercise per Week: Not on file  . Minutes of Exercise per Session: Not on file  Stress:   . Feeling of Stress : Not on file  Social Connections:   . Frequency of Communication with Friends and Family: Not on file  . Frequency of Social Gatherings with Friends and Family: Not on file  . Attends Religious Services: Not on file  . Active Member of Clubs or Organizations: Not on file  . Attends Banker Meetings: Not on file  . Marital Status: Not on file  Intimate Partner Violence:   . Fear of Current or Ex-Partner: Not on file  . Emotionally Abused: Not on file  . Physically Abused: Not on file  . Sexually Abused: Not on file    Past Medical History, Surgical history, Social history, and Family history were reviewed and updated as appropriate.   Please see review of systems for further details on the patient's review from today.   Objective:   Physical Exam:  There were no vitals taken for this visit.  Physical Exam Constitutional:      General: He is not in acute distress.    Appearance: He is well-developed.  Musculoskeletal:        General: No deformity.  Neurological:  Mental Status: He is alert and oriented to person, place, and time.     Cranial Nerves: No dysarthria.     Motor: No tremor.      Coordination: Coordination normal.  Psychiatric:        Attention and Perception: Attention and perception normal. He does not perceive auditory or visual hallucinations.        Mood and Affect: Mood is anxious and depressed. Affect is not labile, blunt, angry or inappropriate.        Speech: Speech normal. Speech is not slurred.        Behavior: Behavior normal. Behavior is cooperative.        Thought Content: Thought content normal. Thought content is not paranoid or delusional. Thought content does not include homicidal or suicidal ideation. Thought content does not include homicidal or suicidal plan.        Cognition and Memory: Cognition and memory normal.        Judgment: Judgment normal.     Comments: Obsessing but manageable. Anxiety is a little worse especially towards the evening and wonders if it is right related to timing of Ritalin wearing off    November 06, 2018: [place] Cog test in office within normal limits MMSE 28/30. Animal fluency 17 . (borderline) Taken as a whole, no indication to pursue neuropsychological testing.  Lab Review:  No results found for: NA, K, CL, CO2, GLUCOSE, BUN, CREATININE, CALCIUM, PROT, ALBUMIN, AST, ALT, ALKPHOS, BILITOT, GFRNONAA, GFRAA  No results found for: WBC, RBC, HGB, HCT, PLT, MCV, MCH, MCHC, RDW, LYMPHSABS, MONOABS, EOSABS, BASOSABS  No results found for: POCLITH, LITHIUM   No results found for: PHENYTOIN, PHENOBARB, VALPROATE, CBMZ  Vitamin D level acceptable at 54.5.    Echocardiogram is stable re: AVR over the last 8 years and not likely the cause of lethargy.  .res Assessment: Plan:    Jerrol was seen today for follow-up, other and anxiety.  Diagnoses and all orders for this visit:  Mixed obsessional thoughts and acts  Major depressive disorder, recurrent episode, moderate (HCC) -     methylphenidate (RITALIN) 10 MG tablet; 3 tablet in the morning and 3 at noon -     buPROPion (WELLBUTRIN XL) 150 MG 24 hr tablet; Take  3 tablets (450 mg total) by mouth daily.  Restless legs syndrome (RLS)  Low vitamin D level  Obstructive sleep apnea  Erectile disorder, acquired, generalized, moderate    Greater than 50% of 30 min face to face time with patient was spent on counseling and coordination of care. We discussed Mr. Labrada has a long history of depression and OCD which are partially controlled.  He requires frequent follow-up and his request because of significant residual depression and anxiety and concerns about polypharmacy and he wants regular therapeutic advice on how to further reduce his OCD.  He believes the depression is a consequence of the residual OCD.  He has some compulsive checking and obsessions around the house maintenance.  He wishes to avoid sexual side effects and so we are keeping the SSRI at the lowest possible dose.  He is tried all of the reasonable SSRI options with the exception possibly of sertraline but it is likely to have more sexual dysfunction than what he is taking now.  When travels then tends to have less OCD bc triggered less.    As discussed  fortunately his anxiety has improved somewhat over time since we increased the Lexapro the last time.  Overall balance  is ok between benefit and SE Lexapro for OCD at 10 mg daily.  His OCD was recently triggered by having to review a rental agreement.  Any legal things will tend to trigger his OCD but he managed with extra Xanax.  No excessive use of Xanax noted.  His OCD is persistent with checking lites, stove, etc. but it is not heavily time-consuming and is mildly to moderately distressing. Continue Lexapro 10 daily he asks about skipping Lexapro for a week.  Discussed withdrawal and tachyphylaxis and strongly recommend against doing so.  He could skip it for a couple of days today with sexual side effects on rare occasions but not on a regular basis.  Overall his level of depression is moderate.  He is able to find things that he  enjoys and he does have interests but they are reduced below normal for him.Marland Kitchen  He is probably too inactive and not as motivated as he should be which is part of his reason for wanting to increase the Ritalin.  Discussed potential benefits, risks, and side effects of stimulants with patient to include increased heart rate, palpitations, insomnia, increased anxiety, increased irritability, or decreased appetite.  Instructed patient to contact office if experiencing any significant tolerability issues. Disc risk of increasing the Ritalin further elevating BP and pulse if we increase the Ritalin.  Need to verify that it's not markedly elevated from taking the Ritalin. Doesn't think it's been consistently elevated. Disc crash risk.  He doesn't need feel it.   Option reduce Ritalin to lessen the worry when it wears off.   He feels that this is helpful. Thinks memory problem relate to OCD bc often counting in the in the background at rest which tends to reduce his attention.  Ritalin is being successfully used off label to augment antidepressants for depression and have resulted in improved productivity and attention.  Encourage walking and disc dieting for wieght loss.  Disc need for weight loss. Current #287.     Option sildenafil but he doesn't want to do it.  Disc standard evaluation of fatigue. Send copies of labs to me.  Before his next appt with me with [facility].  No problem with the switch back to ropinirole. RLS managed.  Still no complaints. Disc also dx PLMS.    Discussed cognitive behavioral techniques for managing both depressive and anxiety symptoms.  For example discussed delay in giving into the compulsion as a form of extinction and response prevention.  Discussed his delayed sleep phase syndrome which currently is only a problem because it is bothering his wife.  However this is fairly chronic and not changing.  Option Rexulti.   Continue lithium 150 mg daily for its neuro  protective effect.  He remains concerned about his short-term memory as previously noted  Flexibility regarding dosing Ritalin in PM  And worry is better generally in the evening than it was.  Usually taking 30 mg daily.  Follow-up 3 weeks  Lynder Parents MD, DFAPA.  Please see After Visit Summary for patient specific instructions.  Future Appointments  Date Time Provider [place]  04/16/2019  1:00 PM Cottle, Billey Co., MD [facility] None  05/07/2019  1:30 PM Cottle, Billey Co., MD [facility] None    No orders of the defined types were placed in this encounter.     -------------------------------

## 2019-03-27 ENCOUNTER — Telehealth: Payer: Self-pay

## 2019-03-27 NOTE — Telephone Encounter (Signed)
Prior authorization submitted for METHYLPHENIDATE 10 MG #180/30 day through [facility] ID# [id number] approved for 6 MONTHS until 07/28/2019.  PA# [id number] Submitted through cover my meds

## 2019-04-16 ENCOUNTER — Other Ambulatory Visit: Payer: Self-pay

## 2019-04-16 ENCOUNTER — Ambulatory Visit (INDEPENDENT_AMBULATORY_CARE_PROVIDER_SITE_OTHER): Payer: No Typology Code available for payment source | Admitting: Psychiatry

## 2019-04-16 ENCOUNTER — Encounter: Payer: Self-pay | Admitting: Psychiatry

## 2019-04-16 DIAGNOSIS — G2581 Restless legs syndrome: Secondary | ICD-10-CM

## 2019-04-16 DIAGNOSIS — F331 Major depressive disorder, recurrent, moderate: Secondary | ICD-10-CM | POA: Diagnosis not present

## 2019-04-16 DIAGNOSIS — R7989 Other specified abnormal findings of blood chemistry: Secondary | ICD-10-CM

## 2019-04-16 DIAGNOSIS — F422 Mixed obsessional thoughts and acts: Secondary | ICD-10-CM | POA: Diagnosis not present

## 2019-04-16 DIAGNOSIS — G4733 Obstructive sleep apnea (adult) (pediatric): Secondary | ICD-10-CM

## 2019-04-16 DIAGNOSIS — F5221 Male erectile disorder: Secondary | ICD-10-CM

## 2019-04-16 MED ORDER — METHYLPHENIDATE HCL 10 MG PO TABS: 30.0000 mg | ORAL_TABLET | Freq: Two times a day (BID) | ORAL | 0 refills | Status: DC

## 2019-04-16 MED ORDER — METHYLPHENIDATE HCL 10 MG PO TABS: ORAL_TABLET | ORAL | 0 refills | Status: DC

## 2019-04-16 NOTE — Progress Notes (Signed)
Eric [name] [id number] [birth year] 69 y.o.    Subjective:   Patient ID:  Eric [name] is a 69 y.o. (DOB September 09, 1950) male.  Chief Complaint:  Chief Complaint  Patient presents with  . Follow-up    Medication Management  . Other    OCD    Depression        Associated symptoms include fatigue and myalgias.  Associated symptoms include no decreased concentration, no headaches and no suicidal ideas.  Past medical history includes anxiety.   Medication Refill Associated symptoms include arthralgias, fatigue and myalgias. Pertinent negatives include no fever, headaches or weakness.  Anxiety Symptoms include nervous/anxious behavior. Patient reports no confusion, decreased concentration, dizziness, shortness of breath or suicidal ideas.      Eric [name] presents for  for follow-up of OCD and depression and med changes.  visit November 27, 2018.   No improvement in energy of lithium and it was recommended that he restart lithium 150 mg daily for his neuro protective effect.  visit December 11, 2018.  No meds were changed.  He was satisfied with the meds currently prescribed.  Last seen March 4,, 2021 . No med changes except he was granted some flexibility around dosing of Ritalin..  Just back from [place] visiting kids. Went well.    Xanax only used 1-2 times/month. 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  Driven me crazy a little.  This is a trigger for OCD.  Xanax helped calm anxiety and help him to sleep.  Manageable OCD otherwise at the lower dose of Lexapro.  Still issues with light switches.  After longer period with less Lexapro he's had a noticed a little more obsessing but managed.  A little worsening OCD about the light switches.  But it is manageable.  Still worry over Covid but does not exacerbate OCD.   Going to [place] soon.  Has gotten both doses.  Wife also vaccinated.  To [facility] March 11-23.  Disc Covid concerns. Occ bouts of  worry, obsessing.  No clear reasons for the bouts.  Frequency every few weeks lasting 20 mins.  Feels uneasy late in evenings due to OCD and still checks.  Recent trigger for OCD around legal issues.  A bit more productive and wife likes that too.  If it weren't for sexual SE would increase it.  No sometimes has ED.   Trainer recommending a colon cleanse.  He did not do it yet.  Disc risk diarrhea affecting med absorption.  Also risk of anxiety with those meds.    Exercise about 3 times weekly with trainer for 30 mins-45 mins.  Wife says he has fragmented sleep.  Dr. Maxwell Caul says CPAP data looks pretty good.   Disc Ritalin and he thinks it's helpful for energy without SE. he feels he is a little more productive on Ritalin.  Legs are jumping. No worsening anxiety.  Still some general malaise.    Primary stress D and 2 gkids in [place].   Can do video session while he's away.  Taking Ritalin 30 mg just once daily bc gets up late.  Sig delayed sleep cycle.  Average 8 but sometimes only 6 hours like last night.   Can find things he enjoys.  But not a lot of things.   Sexual function is OK if he waits long enough between attempts.  Also disc effects of age and testosterone.  Disc risk of testosterone. Interest and enjoyment is reduced.  Too much time on the computer but tolerable bc interest in sex is stil high and function good. Still CO fatigue.    RLS managed ok unless stays up too late.  Sleep good without trouble except as noted. .  Caffeine varies from none to 5 cups.  Infrequent Xanax.   [name] in [place] state.  Attends [employer].    B schizophrenic SUI. After M's death.  Prior psychiatric medication trials include Lexapro, citalopram NR, clomipramine weight gain, paroxetine, fluoxetine, Luvox, Trintellix,   bupropion, Abilify 10 fatigue, Cerefolin NAC, and modafinil and Nuvigil,  pramipexole,  remotely took Adderall and Ritalin. Increase Lexapro back to 20 mg  January.  Review of Systems:  Review of Systems  Constitutional: Positive for fatigue. Negative for fever.  Respiratory: Negative for shortness of breath.   Musculoskeletal: Positive for arthralgias and myalgias.  Neurological: Negative for dizziness, tremors, weakness and headaches.  Psychiatric/Behavioral: Positive for depression and dysphoric mood. Negative for agitation, behavioral problems, confusion, decreased concentration, hallucinations, self-injury, sleep disturbance and suicidal ideas. The patient is nervous/anxious. The patient is not hyperactive.     Medications: I have reviewed the patient's current medications.  Current Outpatient Medications  Medication Sig Dispense Refill  . ALPRAZolam (XANAX) 0.25 MG tablet Take 0.25 mg by mouth 2 (two) times daily as needed for anxiety or sleep.    Marland Kitchen aspirin 81 MG tablet Take 81 mg by mouth daily.    Marland Kitchen buPROPion (WELLBUTRIN XL) 150 MG 24 hr tablet Take 3 tablets (450 mg total) by mouth daily. 270 tablet 1  . Cholecalciferol (VITAMIN D-3) 5000 units TABS Take 5,000 Units by mouth daily.     . Coenzyme Q10 200 MG TABS Take 300 mg by mouth daily.     . CRESTOR 5 MG tablet Take 5 mg by mouth daily.  2  . Cyanocobalamin (VITAMIN B 12) 500 MCG TABS Take 1,000 mcg by mouth.     . desonide (DESOWEN) 0.05 % ointment   0  . econazole nitrate 1 % cream Apply topically daily.    Marland Kitchen escitalopram (LEXAPRO) 20 MG tablet TAKE 1 TABLET BY MOUTH EVERY DAY (Patient taking differently: 10 mg. ) 90 tablet 0  . lactase (LACTAID) 3000 units tablet Take 3,000 Units by mouth as needed.     . lithium carbonate 150 MG capsule Take 1 capsule (150 mg total) by mouth at bedtime. 90 capsule 1  . methylphenidate (RITALIN) 10 MG tablet 3 tablet in the morning and 3 at noon 180 tablet 0  . mupirocin ointment (BACTROBAN) 2 %   1  . risperiDONE (RISPERDAL) 0.25 MG tablet Take 0.25 mg by mouth daily as needed (tends to brighten mood when needed).     Marland Kitchen rOPINIRole (REQUIP)  1 MG tablet TAKE 3 TABLETS (3 MG TOTAL) BY MOUTH AT BEDTIME. (Patient taking differently: Take 2 mg by mouth at bedtime. ) 270 tablet 0   No current facility-administered medications for this visit.    Medication Side Effects: None sexual SE are better not  All gone.  Allergies:  Allergies  Allergen Reactions  . Erythromycin Hives    Past Medical History:  Diagnosis Date  . Allergy   . Anemia    iron  . Carotid artery occlusion   . Hyperlipidemia   . Obesity   . OCD (obsessive compulsive disorder)   . Periodic limb movement disorder   . Sleep apnea     Family History  Problem Relation Age of Onset  .  Cancer Mother        breast and ovarian  . Depression Father        bi-polar    Social History   Socioeconomic History  . Marital status: Married    Spouse name: Not on file  . Number of children: Not on file  . Years of education: Not on file  . Highest education level: Not on file  Occupational History  . Not on file  Tobacco Use  . Smoking status: Never Smoker  . Smokeless tobacco: Never Used  Substance and Sexual Activity  . Alcohol use: No  . Drug use: No  . Sexual activity: Not on file  Other Topics Concern  . Not on file  Social History Narrative  . Not on file   Social Determinants of Health   Financial Resource Strain:   . Difficulty of Paying Living Expenses:   Food Insecurity:   . Worried About Programme researcher, broadcasting/film/videounning Out of Food in the Last Year:   . Baristaan Out of Food in the Last Year:   Transportation Needs:   . Freight forwarderLack of Transportation (Medical):   Marland Kitchen. Lack of Transportation (Non-Medical):   Physical Activity:   . Days of Exercise per Week:   . Minutes of Exercise per Session:   Stress:   . Feeling of Stress :   Social Connections:   . Frequency of Communication with Friends and Family:   . Frequency of Social Gatherings with Friends and Family:   . Attends Religious Services:   . Active Member of Clubs or Organizations:   . Attends BankerClub or Organization  Meetings:   Marland Kitchen. Marital Status:   Intimate Partner Violence:   . Fear of Current or Ex-Partner:   . Emotionally Abused:   Marland Kitchen. Physically Abused:   . Sexually Abused:     Past Medical History, Surgical history, Social history, and Family history were reviewed and updated as appropriate.   Please see review of systems for further details on the patient's review from today.   Objective:   Physical Exam:  There were no vitals taken for this visit.  Physical Exam Constitutional:      General: He is not in acute distress.    Appearance: He is well-developed.  Musculoskeletal:        General: No deformity.  Neurological:     Mental Status: He is alert and oriented to person, place, and time.     Cranial Nerves: No dysarthria.     Motor: No tremor.     Coordination: Coordination normal.  Psychiatric:        Attention and Perception: Attention and perception normal. He does not perceive auditory or visual hallucinations.        Mood and Affect: Mood is anxious and depressed. Affect is not labile, blunt, angry or inappropriate.        Speech: Speech normal. Speech is not slurred.        Behavior: Behavior normal. Behavior is cooperative.        Thought Content: Thought content normal. Thought content is not paranoid or delusional. Thought content does not include homicidal or suicidal ideation. Thought content does not include homicidal or suicidal plan.        Cognition and Memory: Cognition and memory normal.        Judgment: Judgment normal.     Comments: Obsessing but manageable. Anxiety is a little worse especially towards the evening    November 06, 2018: [place] Cog test in office  within normal limits MMSE 28/30. Animal fluency 17 . (borderline) Taken as a whole, no indication to pursue neuropsychological testing.  Lab Review:  No results found for: NA, K, CL, CO2, GLUCOSE, BUN, CREATININE, CALCIUM, PROT, ALBUMIN, AST, ALT, ALKPHOS, BILITOT, GFRNONAA, GFRAA  No results  found for: WBC, RBC, HGB, HCT, PLT, MCV, MCH, MCHC, RDW, LYMPHSABS, MONOABS, EOSABS, BASOSABS  No results found for: POCLITH, LITHIUM   No results found for: PHENYTOIN, PHENOBARB, VALPROATE, CBMZ  Vitamin D level acceptable at 54.5.    Echocardiogram is stable re: AVR over the last 8 years and not likely the cause of lethargy.  .res Assessment: Plan:    There are no diagnoses linked to this encounter.  Greater than 50% of 30 min face to face time with patient was spent on counseling and coordination of care. We discussed Mr. Gerry has a long history of depression and OCD which are partially controlled.  He requires frequent follow-up and his request because of significant residual depression and anxiety and concerns about polypharmacy and he wants regular therapeutic advice on how to further reduce his OCD.  He believes the depression is a consequence of the residual OCD.  He has some compulsive checking and obsessions around the house maintenance.  He wishes to avoid sexual side effects and so we are keeping the SSRI at the lowest possible dose.  He is tried all of the reasonable SSRI options with the exception possibly of sertraline but it is likely to have more sexual dysfunction than what he is taking now.  When travels then tends to have less OCD bc triggered less.    As discussed  fortunately his anxiety has improved somewhat over time since we increased the Lexapro the last time.  Overall balance is ok between benefit and SE Lexapro for OCD at 10 mg daily.  His OCD was recently triggered by having to review a rental agreement.  Any legal things will tend to trigger his OCD but he managed with extra Xanax.  No excessive use of Xanax noted.  His OCD is persistent with checking lites, stove, etc. but it is not heavily time-consuming and is mildly to moderately distressing. Continue Lexapro 10 daily he asks about skipping Lexapro for a week.  Discussed withdrawal and tachyphylaxis and  strongly recommend against doing so.  He could skip it for a couple of days today with sexual side effects on rare occasions but not on a regular basis.  Overall his level of depression is moderate.  He is able to find things that he enjoys and he does have interests but they are reduced below normal for him.Marland Kitchen  He is probably too inactive and not as motivated as he should be which is part of his reason for wanting to increase the Ritalin.  Discussed potential benefits, risks, and side effects of stimulants with patient to include increased heart rate, palpitations, insomnia, increased anxiety, increased irritability, or decreased appetite.  Instructed patient to contact office if experiencing any significant tolerability issues. Disc risk of increasing the Ritalin further elevating BP and pulse if we increase the Ritalin.  Need to verify that it's not markedly elevated from taking the Ritalin. Doesn't think it's been consistently elevated. Disc crash risk.  He doesn't need feel it.   Option reduce Ritalin to lessen the worry when it wears off.   He feels that this is helpful. Thinks memory problem relate to OCD bc often counting in the in the background at rest which tends  to reduce his attention.  Ritalin is being successfully used off label to augment antidepressants for depression and have resulted in improved productivity and attention.  Encourage walking and disc dieting for wieght loss.  Disc need for weight loss. Current #287.     Option sildenafil but he doesn't want to do it.  Disc standard evaluation of fatigue. Send copies of labs to me.  Before his next appt with me with [facility].  No problem with the switch back to ropinirole. RLS managed.  Still no complaints. Disc also dx PLMS.    Discussed cognitive behavioral techniques for managing both depressive and anxiety symptoms.  For example discussed delay in giving into the compulsion as a form of extinction and response  prevention.  Discussed his delayed sleep phase syndrome which currently is only a problem because it is bothering his wife.  However this is fairly chronic and not changing.  Option Rexulti.   Continue lithium 150 mg daily for its neuro protective effect.  He remains concerned about his short-term memory as previously noted  Flexibility regarding dosing Ritalin in PM  And worry is better generally in the evening than it was.  Usually taking 30 mg daily.   No other med change  Follow-up 4 weeks  Meredith Staggers MD, DFAPA.  Please see After Visit Summary for patient specific instructions.  Future Appointments  Date Time Provider Department Center  05/07/2019  1:30 PM Cottle, Steva Ready., MD [facility] None  05/29/2019 11:00 AM Cottle, Steva Ready., MD [facility] None  06/18/2019  1:00 PM Cottle, Steva Ready., MD [facility] None    No orders of the defined types were placed in this encounter.     -------------------------------

## 2019-04-28 DIAGNOSIS — G4733 Obstructive sleep apnea (adult) (pediatric): Secondary | ICD-10-CM | POA: Diagnosis not present

## 2019-05-07 ENCOUNTER — Ambulatory Visit (INDEPENDENT_AMBULATORY_CARE_PROVIDER_SITE_OTHER): Payer: No Typology Code available for payment source | Admitting: Psychiatry

## 2019-05-07 ENCOUNTER — Other Ambulatory Visit: Payer: Self-pay

## 2019-05-07 ENCOUNTER — Encounter: Payer: Self-pay | Admitting: Psychiatry

## 2019-05-07 DIAGNOSIS — R7989 Other specified abnormal findings of blood chemistry: Secondary | ICD-10-CM

## 2019-05-07 DIAGNOSIS — F331 Major depressive disorder, recurrent, moderate: Secondary | ICD-10-CM

## 2019-05-07 DIAGNOSIS — G4733 Obstructive sleep apnea (adult) (pediatric): Secondary | ICD-10-CM

## 2019-05-07 DIAGNOSIS — F422 Mixed obsessional thoughts and acts: Secondary | ICD-10-CM | POA: Diagnosis not present

## 2019-05-07 DIAGNOSIS — G2581 Restless legs syndrome: Secondary | ICD-10-CM

## 2019-05-07 DIAGNOSIS — F5221 Male erectile disorder: Secondary | ICD-10-CM

## 2019-05-07 MED ORDER — ALPRAZOLAM 0.25 MG PO TABS: 0.2500 mg | ORAL_TABLET | Freq: Two times a day (BID) | ORAL | 1 refills | Status: DC | PRN

## 2019-05-07 NOTE — Progress Notes (Signed)
Eric [name] [id number] [birth year] 69 y.o.    Subjective:   Patient ID:  Eric [name] is a 69 y.o. (DOB [birth year]) male.  Chief Complaint:  Chief Complaint  Patient presents with  . Anxiety  . Depression  . Follow-up    OCD    Depression        Associated symptoms include fatigue and myalgias.  Associated symptoms include no decreased concentration, no headaches and no suicidal ideas.  Past medical history includes anxiety.   Medication Refill Associated symptoms include arthralgias, fatigue and myalgias. Pertinent negatives include no fever, headaches or weakness.  Anxiety Symptoms include nervous/anxious behavior. Patient reports no confusion, decreased concentration, dizziness, palpitations, shortness of breath or suicidal ideas.      Eric [name] presents for  for follow-up of OCD and depression and med changes.  visit November 27, 2018.   No improvement in energy of lithium and it was recommended that he restart lithium 150 mg daily for his neuro protective effect.  visit December 11, 2018.  No meds were changed.  He was satisfied with the meds currently prescribed.  seen March 4,, 2021 . No med changes except he was granted some flexibility around dosing of Ritalin.. Just back from [place] visiting kids. Went well.    Last seen April 16, 2019.  No meds were changed.  As of May 07, 2019 he reports the following:  Xanax only used 1-2 times/month. 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  Driven me crazy a little.  This is a trigger for OCD.  Xanax helped calm anxiety and help him to sleep.  Manageable OCD otherwise at the lower dose of Lexapro.  Still issues with light switches.  After longer period with less Lexapro he's had a noticed a little more obsessing but managed.  A little worsening OCD about the light switches.  But it is manageable.  Still worry over Covid but does not exacerbate OCD.  Risperidone is  infrequent.  Eric [name] says he's doing a little better with chore completion.  GS 69 yo coming to visit end of May and will play with train set.  OCD at baseline with light switches 5-10 minutes.  Had a relapse since here but it was brief.  Working on his sleep.    Exercise about 3 times weekly with trainer for 30 mins-45 mins.  Wife says he has fragmented sleep.  Dr. Earl [name] says CPAP data looks pretty good.   Disc Ritalin and he thinks it's helpful for energy without SE. he feels he is a little more productive on Ritalin.  Legs are jumping. No worsening anxiety.  Still some general malaise.    Taking Ritalin 30 mg just once daily bc gets up late. Primary benefit is energy.  Does not take it daily.  Sig delayed sleep cycle.  Average 8 but sometimes only 6 hours like last night.   Can find things he enjoys.  But not a lot of things.   Sexual function is OK if he waits long enough between attempts.  Also disc effects of age and testosterone.  Disc risk of testosterone. Interest and enjoyment is reduced.  Too much time on the computer but tolerable bc interest in sex is stil high and function good. Still CO fatigue.    RLS managed ok unless stays up too late.  Sleep good without trouble except as noted. .  Caffeine varies from none to 5 cups.  Infrequent Xanax.  [name] in [place] state.  Attends [employer].    B schizophrenic SUI. After M's death. PCP Eric [name] at [place]  Prior psychiatric medication trials include Lexapro, citalopram NR, clomipramine weight gain, paroxetine, fluoxetine, Luvox, Trintellix,   bupropion, Abilify 10 fatigue, Cerefolin NAC, and modafinil and Nuvigil,  pramipexole,  remotely took Adderall and Ritalin. Increase Lexapro back to 20 mg January.  Review of Systems:  Review of Systems  Constitutional: Positive for fatigue. Negative for fever.  Respiratory: Negative for shortness of breath.   Cardiovascular: Negative for palpitations.   Musculoskeletal: Positive for arthralgias and myalgias.  Neurological: Negative for dizziness, tremors, weakness and headaches.  Psychiatric/Behavioral: Positive for depression and dysphoric mood. Negative for agitation, behavioral problems, confusion, decreased concentration, hallucinations, self-injury, sleep disturbance and suicidal ideas. The patient is nervous/anxious. The patient is not hyperactive.     Medications: I have reviewed the patient's current medications.  Current Outpatient Medications  Medication Sig Dispense Refill  . ALPRAZolam (XANAX) 0.25 MG tablet Take 1 tablet (0.25 mg total) by mouth 2 (two) times daily as needed for anxiety or sleep. 30 tablet 1  . aspirin 81 MG tablet Take 81 mg by mouth daily.    Marland Kitchen buPROPion (WELLBUTRIN XL) 150 MG 24 hr tablet Take 3 tablets (450 mg total) by mouth daily. 270 tablet 1  . Cholecalciferol (VITAMIN D-3) 5000 units TABS Take 5,000 Units by mouth daily.     . Coenzyme Q10 200 MG TABS Take 300 mg by mouth daily.     . CRESTOR 5 MG tablet Take 5 mg by mouth daily.  2  . Cyanocobalamin (VITAMIN B 12) 500 MCG TABS Take 1,000 mcg by mouth.     . desonide (DESOWEN) 0.05 % ointment   0  . econazole nitrate 1 % cream Apply topically daily.    Marland Kitchen escitalopram (LEXAPRO) 20 MG tablet TAKE 1 TABLET BY MOUTH EVERY DAY (Patient taking differently: 10 mg. ) 90 tablet 0  . lactase (LACTAID) 3000 units tablet Take 3,000 Units by mouth as needed.     . lithium carbonate 150 MG capsule Take 1 capsule (150 mg total) by mouth at bedtime. 90 capsule 1  . methylphenidate (RITALIN) 10 MG tablet 3 tablet in the morning and 3 at noon 180 tablet 0  . [START ON 05/14/2019] methylphenidate (RITALIN) 10 MG tablet Take 3 tablets (30 mg total) by mouth 2 (two) times daily. 180 tablet 0  . [START ON 06/11/2019] methylphenidate (RITALIN) 10 MG tablet Take 3 tablets (30 mg total) by mouth 2 (two) times daily. 180 tablet 0  . mupirocin ointment (BACTROBAN) 2 %   1  .  risperiDONE (RISPERDAL) 0.25 MG tablet Take 0.25 mg by mouth daily as needed (tends to brighten mood when needed).     Marland Kitchen rOPINIRole (REQUIP) 1 MG tablet TAKE 3 TABLETS (3 MG TOTAL) BY MOUTH AT BEDTIME. (Patient taking differently: Take 2 mg by mouth at bedtime. ) 270 tablet 0   No current facility-administered medications for this visit.    Medication Side Effects: None sexual SE are better not  All gone.  Allergies:  Allergies  Allergen Reactions  . Erythromycin Hives    Past Medical History:  Diagnosis Date  . Allergy   . Anemia    iron  . Carotid artery occlusion   . Hyperlipidemia   . Obesity   . OCD (obsessive compulsive disorder)   . Periodic limb movement disorder   . Sleep apnea  Family History  Problem Relation Age of Onset  . Cancer Mother        breast and ovarian  . Depression Father        bi-polar    Social History   Socioeconomic History  . Marital status: Married    Spouse name: Not on file  . Number of children: Not on file  . Years of education: Not on file  . Highest education level: Not on file  Occupational History  . Not on file  Tobacco Use  . Smoking status: Never Smoker  . Smokeless tobacco: Never Used  Substance and Sexual Activity  . Alcohol use: No  . Drug use: No  . Sexual activity: Not on file  Other Topics Concern  . Not on file  Social History Narrative  . Not on file   Social Determinants of Health   Financial Resource Strain:   . Difficulty of Paying Living Expenses:   Food Insecurity:   . Worried About Programme researcher, broadcasting/film/videounning Out of Food in the Last Year:   . Baristaan Out of Food in the Last Year:   Transportation Needs:   . Freight forwarderLack of Transportation (Medical):   Marland Kitchen. Lack of Transportation (Non-Medical):   Physical Activity:   . Days of Exercise per Week:   . Minutes of Exercise per Session:   Stress:   . Feeling of Stress :   Social Connections:   . Frequency of Communication with Friends and Family:   . Frequency of Social  Gatherings with Friends and Family:   . Attends Religious Services:   . Active Member of Clubs or Organizations:   . Attends BankerClub or Organization Meetings:   Marland Kitchen. Marital Status:   Intimate Partner Violence:   . Fear of Current or Ex-Partner:   . Emotionally Abused:   Marland Kitchen. Physically Abused:   . Sexually Abused:     Past Medical History, Surgical history, Social history, and Family history were reviewed and updated as appropriate.   Please see review of systems for further details on the patient's review from today.   Objective:   Physical Exam:  There were no vitals taken for this visit.  Physical Exam Constitutional:      General: He is not in acute distress.    Appearance: He is well-developed. He is obese.  Musculoskeletal:        General: No deformity.  Neurological:     Mental Status: He is alert and oriented to person, place, and time.     Cranial Nerves: No dysarthria.     Motor: No tremor.     Coordination: Coordination normal.  Psychiatric:        Attention and Perception: Attention and perception normal. He does not perceive auditory or visual hallucinations.        Mood and Affect: Mood is anxious and depressed. Affect is not labile, blunt, angry or inappropriate.        Speech: Speech normal. Speech is not rapid and pressured or slurred.        Behavior: Behavior normal. Behavior is cooperative.        Thought Content: Thought content normal. Thought content is not paranoid or delusional. Thought content does not include homicidal or suicidal ideation. Thought content does not include homicidal or suicidal plan.        Cognition and Memory: Cognition and memory normal.        Judgment: Judgment normal.     Comments: Obsessing but manageable. Anxiety is a  little worse especially towards the evening    November 06, 2018: [place] Cog test in office within normal limits MMSE 28/30. Animal fluency 17 . (borderline) Taken as a whole, no indication to pursue  neuropsychological testing.  Lab Review:  No results found for: NA, K, CL, CO2, GLUCOSE, BUN, CREATININE, CALCIUM, PROT, ALBUMIN, AST, ALT, ALKPHOS, BILITOT, GFRNONAA, GFRAA  No results found for: WBC, RBC, HGB, HCT, PLT, MCV, MCH, MCHC, RDW, LYMPHSABS, MONOABS, EOSABS, BASOSABS  No results found for: POCLITH, LITHIUM   No results found for: PHENYTOIN, PHENOBARB, VALPROATE, CBMZ  Vitamin D level acceptable at 54.5.    Echocardiogram is stable re: AVR over the last 8 years and not likely the cause of lethargy.  .res Assessment: Plan:    Eric [name] was seen today for anxiety, depression and follow-up.  Diagnoses and all orders for this visit:  Mixed obsessional thoughts and acts  Major depressive disorder, recurrent episode, moderate (HCC)  Restless legs syndrome (RLS)  Low vitamin D level  Obstructive sleep apnea  Erectile disorder, acquired, generalized, moderate  Restless legs syndrome  Other orders -     ALPRAZolam (XANAX) 0.25 MG tablet; Take 1 tablet (0.25 mg total) by mouth 2 (two) times daily as needed for anxiety or sleep.    Greater than 50% of 30 min face to face time with patient was spent on counseling and coordination of care. We discussed Mr. Dente has a long history of depression and OCD which are partially controlled.  He requires frequent follow-up and his request because of significant residual depression and anxiety and concerns about polypharmacy and he wants regular therapeutic advice on how to further reduce his OCD.  He believes the depression is a consequence of the residual OCD.  He has some compulsive checking and obsessions around the house maintenance.  He wishes to avoid sexual side effects and so we are keeping the SSRI at the lowest possible dose.  He is tried all of the reasonable SSRI options with the exception possibly of sertraline but it is likely to have more sexual dysfunction than what he is taking now.  When travels then tends to have  less OCD bc triggered less.    Disc goal of more completely eliminating the OCD the less prone he'll be to relapses.  He is not sure he agrees.  As discussed  fortunately his anxiety has improved somewhat over time since we increased the Lexapro the last time.  Overall balance is ok between benefit and SE Lexapro for OCD at 10 mg daily.  His OCD was recently triggered by having to review a rental agreement.  Any legal things will tend to trigger his OCD but he managed with extra Xanax.  No excessive use of Xanax noted.  His OCD is persistent with checking lites, stove, etc. but it is not heavily time-consuming and is mildly to moderately distressing. Continue Lexapro 10 daily he asks about skipping Lexapro for a week.  Discussed withdrawal and tachyphylaxis and strongly recommend against doing so.  He could skip it for a couple of days today with sexual side effects on rare occasions but not on a regular basis.  Overall his level of depression is moderate.  He is able to find things that he enjoys and he does have interests but they are reduced below normal for him.Marland Kitchen  He is probably too inactive and not as motivated as he should be which is part of his reason for wanting to increase the Ritalin.  Discussed potential benefits, risks, and side effects of stimulants with patient to include increased heart rate, palpitations, insomnia, increased anxiety, increased irritability, or decreased appetite.  Instructed patient to contact office if experiencing any significant tolerability issues. Disc risk of increasing the Ritalin further elevating BP and pulse if we increase the Ritalin.  Need to verify that it's not markedly elevated from taking the Ritalin. Doesn't think it's been consistently elevated. Disc crash risk.  He doesn't need feel it.   Option reduce Ritalin to lessen the worry when it wears off.   He feels that this is helpful for energy. Flexibility regarding dosing Ritalin in PM  And worry is  better generally in the evening than it was.  Usually taking 30 mg daily. Disc pros and cons of daily vs prn and tolerance issues.  He plans to use it  Prn.  Thinks memory problem relate to OCD bc often counting in the in the background at rest which tends to reduce his attention.  Ritalin is being successfully used off label to augment antidepressants for depression and have resulted in improved productivity and attention.  Encourage walking and disc dieting for wieght loss.  Disc need for weight loss. Current #287.   Disc option Ozempic with PCP.  Option sildenafil but he doesn't want to do it.  Disc standard evaluation of fatigue. Send copies of labs to me.  Before his next appt with me with [facility].  No problem with the switch back to ropinirole. RLS managed.  Still no complaints. Disc also dx PLMS.    Discussed cognitive behavioral techniques for managing both depressive and anxiety symptoms.  For example discussed delay in giving into the compulsion as a form of extinction and response prevention.  Discussed his delayed sleep phase syndrome which currently is only a problem because it is bothering his wife.  However this is fairly chronic and not changing.  Option Rexulti.   Continue lithium 150 mg daily for its neuro protective effect.  He remains concerned about his short-term memory as previously noted  No other med change  Follow-up 4 weeks  Meredith Staggers MD, DFAPA.  Please see After Visit Summary for patient specific instructions.  Future Appointments  Date Time Provider Department Center  05/29/2019 11:00 AM Cottle, Steva Ready., MD [facility] None  06/18/2019  1:00 PM Cottle, Steva Ready., MD [facility] None    No orders of the defined types were placed in this encounter.     -------------------------------

## 2019-05-29 ENCOUNTER — Ambulatory Visit (INDEPENDENT_AMBULATORY_CARE_PROVIDER_SITE_OTHER): Payer: No Typology Code available for payment source | Admitting: Psychiatry

## 2019-05-29 ENCOUNTER — Encounter: Payer: Self-pay | Admitting: Psychiatry

## 2019-05-29 ENCOUNTER — Other Ambulatory Visit: Payer: Self-pay

## 2019-05-29 VITALS — BP 148/76 | HR 68

## 2019-05-29 DIAGNOSIS — F331 Major depressive disorder, recurrent, moderate: Secondary | ICD-10-CM

## 2019-05-29 DIAGNOSIS — F422 Mixed obsessional thoughts and acts: Secondary | ICD-10-CM

## 2019-05-29 DIAGNOSIS — G2581 Restless legs syndrome: Secondary | ICD-10-CM

## 2019-05-29 DIAGNOSIS — R7989 Other specified abnormal findings of blood chemistry: Secondary | ICD-10-CM | POA: Diagnosis not present

## 2019-05-29 DIAGNOSIS — F5221 Male erectile disorder: Secondary | ICD-10-CM

## 2019-05-29 DIAGNOSIS — G4733 Obstructive sleep apnea (adult) (pediatric): Secondary | ICD-10-CM

## 2019-05-29 NOTE — Progress Notes (Signed)
Eric [name] [id number] 12-24-1950 69 y.o.    Subjective:   Patient ID:  Eric [name] is a 69 y.o. (DOB Aug 11, 1950) male.  Chief Complaint:  Chief Complaint  Patient presents with  . Follow-up  . Anxiety    OCD  . Depression  . Fatigue    Depression        Associated symptoms include fatigue and myalgias.  Associated symptoms include no decreased concentration, no headaches and no suicidal ideas.  Past medical history includes anxiety.   Medication Refill Associated symptoms include arthralgias, fatigue and myalgias. Pertinent negatives include no chest pain, fever, headaches or weakness.  Anxiety Symptoms include nervous/anxious behavior. Patient reports no chest pain, confusion, decreased concentration, dizziness, palpitations, shortness of breath or suicidal ideas.      Eric [name] presents for  for follow-up of OCD and depression and med changes.  visit November 27, 2018.   No improvement in energy of lithium and it was recommended that he restart lithium 150 mg daily for his neuro protective effect.  visit December 11, 2018.  No meds were changed.  He was satisfied with the meds currently prescribed.  seen March 4,, 2021 . No med changes except he was granted some flexibility around dosing of Ritalin.. Just back from [place] visiting kids. Went well.    Last seen April 16, 2019.  No meds were changed.  As of May 07, 2019 he reports the following: Xanax only used 1-2 times/month. 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  Driven me crazy a little.  This is a trigger for OCD.  Xanax helped calm anxiety and help him to sleep.  Manageable OCD otherwise at the lower dose of Lexapro.  Still issues with light switches.  After longer period with less Lexapro he's had a noticed a little more obsessing but managed.  A little worsening OCD about the light switches.  But it is manageable.  Still worry over Covid but does not  exacerbate OCD.  Risperidone is infrequent. Eric [name] says he's doing a little better with chore completion.  GS 69 yo coming to visit end of May and will play with train set.  OCD at baseline with light switches 5-10 minutes.  Had a relapse since here but it was brief.    RLS managed ok unless stays up too late.  Caffeine varies from none to 5 cups.  Infrequent Xanax.  Exercise about 3 times weekly with trainer for 30 mins-45 mins. Wife says he has fragmented sleep.  Dr. Earl [name] says CPAP data looks pretty good.   Disc Ritalin and he thinks it's helpful for energy without SE. he feels he is a little more productive on Ritalin.  Legs are jumping. No worsening anxiety.  Still some general malaise.   Taking Ritalin 30 mg just once daily bc gets up late. Primary benefit is energy.  Still CO fatigue.  Does not take it daily.    Average 8.   Can find things he enjoys.  But not a lot of things.  Interest and enjoyment is reduced.  Sexual function is OK if he waits long enough between attempts.  Also disc effects of age and testosterone.  Disc risk of testosterone. Plan: Disc Ozempic for weight loss with PCP  05/29/2019 appt, the following noted: Increased ropinirole to 3 mg bc felt it worked better.  Rare Xanax and risperidone.   Making progress and getting things done. OCD does interfere bc doesn't  want to throw things away.  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   Setting up train set for GS.   Going to bed earlier and getting  Up earlier.  Taking least necessary Ritalin so just in the AM. Depression at baseline.   Stamina is not good. Wonders about tiredness.  Stumbling too much.  Stairs are a problem but manages.   [name].  Attends [employer].    B schizophrenic SUI. After M's death. PCP Eric [name], [facility]  Prior psychiatric medication trials include Lexapro, citalopram NR, clomipramine weight gain, paroxetine, fluoxetine, Luvox, Trintellix,    bupropion, Abilify 10 fatigue, Cerefolin NAC, and   pramipexole,  ropinirole remotely took Adderall, Ritalin 30, modafinil and Nuvigil, Increase Lexapro back to 20 mg January 2020.  Review of Systems:  Review of Systems  Constitutional: Positive for fatigue. Negative for fever.  Respiratory: Negative for shortness of breath.   Cardiovascular: Negative for chest pain and palpitations.  Musculoskeletal: Positive for arthralgias and myalgias.  Neurological: Negative for dizziness, tremors, weakness and headaches.  Psychiatric/Behavioral: Positive for depression and dysphoric mood. Negative for agitation, behavioral problems, confusion, decreased concentration, hallucinations, self-injury, sleep disturbance and suicidal ideas. The patient is nervous/anxious. The patient is not hyperactive.     Medications: I have reviewed the patient's current medications.  Current Outpatient Medications  Medication Sig Dispense Refill  . ALPRAZolam (XANAX) 0.25 MG tablet Take 1 tablet (0.25 mg total) by mouth 2 (two) times daily as needed for anxiety or sleep. 30 tablet 1  . aspirin 81 MG tablet Take 81 mg by mouth daily.    Marland Kitchen. buPROPion (WELLBUTRIN XL) 150 MG 24 hr tablet Take 3 tablets (450 mg total) by mouth daily. 270 tablet 1  . Cholecalciferol (VITAMIN D-3) 5000 units TABS Take 5,000 Units by mouth daily.     . Coenzyme Q10 200 MG TABS Take 300 mg by mouth daily.     . CRESTOR 5 MG tablet Take 5 mg by mouth daily.  2  . Cyanocobalamin (VITAMIN B 12) 500 MCG TABS Take 1,000 mcg by mouth.     . desonide (DESOWEN) 0.05 % ointment   0  . econazole nitrate 1 % cream Apply topically daily.    Marland Kitchen. escitalopram (LEXAPRO) 20 MG tablet TAKE 1 TABLET BY MOUTH EVERY DAY (Patient taking differently: 10 mg. ) 90 tablet 0  . lactase (LACTAID) 3000 units tablet Take 3,000 Units by mouth as needed.     . lithium carbonate 150 MG capsule Take 1 capsule (150 mg total) by mouth at bedtime. 90 capsule 1  . methylphenidate  (RITALIN) 10 MG tablet 3 tablet in the morning and 3 at noon 180 tablet 0  . methylphenidate (RITALIN) 10 MG tablet Take 3 tablets (30 mg total) by mouth 2 (two) times daily. 180 tablet 0  . [START ON 06/11/2019] methylphenidate (RITALIN) 10 MG tablet Take 3 tablets (30 mg total) by mouth 2 (two) times daily. 180 tablet 0  . mupirocin ointment (BACTROBAN) 2 %   1  . rOPINIRole (REQUIP) 1 MG tablet TAKE 3 TABLETS (3 MG TOTAL) BY MOUTH AT BEDTIME. (Patient taking differently: Take 3 mg by mouth at bedtime. ) 270 tablet 0  . risperiDONE (RISPERDAL) 0.25 MG tablet Take 0.25 mg by mouth daily as needed (tends to brighten mood when needed).      No current facility-administered medications for this visit.    Medication Side Effects: None sexual SE are  better not  All gone.  Allergies:  Allergies  Allergen Reactions  . Erythromycin Hives    Past Medical History:  Diagnosis Date  . Allergy   . Anemia    iron  . Carotid artery occlusion   . Hyperlipidemia   . Obesity   . OCD (obsessive compulsive disorder)   . Periodic limb movement disorder   . Sleep apnea     Family History  Problem Relation Age of Onset  . Cancer Mother        breast and ovarian  . Depression Father        bi-polar    Social History   Socioeconomic History  . Marital status: Married    Spouse name: Not on file  . Number of children: Not on file  . Years of education: Not on file  . Highest education level: Not on file  Occupational History  . Not on file  Tobacco Use  . Smoking status: Never Smoker  . Smokeless tobacco: Never Used  Substance and Sexual Activity  . Alcohol use: No  . Drug use: No  . Sexual activity: Not on file  Other Topics Concern  . Not on file  Social History Narrative  . Not on file   Social Determinants of Health   Financial Resource Strain:   . Difficulty of Paying Living Expenses:   Food Insecurity:   . Worried About Programme researcher, broadcasting/film/video in the Last Year:   . Garment/textile technologist in the Last Year:   Transportation Needs:   . Freight forwarder (Medical):   Marland Kitchen Lack of Transportation (Non-Medical):   Physical Activity:   . Days of Exercise per Week:   . Minutes of Exercise per Session:   Stress:   . Feeling of Stress :   Social Connections:   . Frequency of Communication with Friends and Family:   . Frequency of Social Gatherings with Friends and Family:   . Attends Religious Services:   . Active Member of Clubs or Organizations:   . Attends Banker Meetings:   Marland Kitchen Marital Status:   Intimate Partner Violence:   . Fear of Current or Ex-Partner:   . Emotionally Abused:   Marland Kitchen Physically Abused:   . Sexually Abused:     Past Medical History, Surgical history, Social history, and Family history were reviewed and updated as appropriate.   Please see review of systems for further details on the patient's review from today.   Objective:   Physical Exam:  BP (!) 148/76   Pulse 68   Physical Exam Constitutional:      General: He is not in acute distress.    Appearance: He is well-developed. He is obese.  Musculoskeletal:        General: No deformity.  Neurological:     Mental Status: He is alert and oriented to person, place, and time.     Cranial Nerves: No dysarthria.     Motor: No tremor.     Coordination: Coordination normal.  Psychiatric:        Attention and Perception: Attention and perception normal. He does not perceive auditory or visual hallucinations.        Mood and Affect: Mood is anxious and depressed. Affect is not labile, blunt, angry or inappropriate.        Speech: Speech normal. Speech is not rapid and pressured or slurred.        Behavior: Behavior normal. Behavior is cooperative.  Thought Content: Thought content normal. Thought content is not paranoid or delusional. Thought content does not include homicidal or suicidal ideation. Thought content does not include homicidal or suicidal plan.        Cognition  and Memory: Cognition and memory normal.        Judgment: Judgment normal.     Comments: Obsessing but manageable. Anxiety is a little worse especially towards the evening  Fidgety chronically varies intensity    November 06, 2018: [place] Cog test in office within normal limits MMSE 28/30. Animal fluency 17 . (borderline) Taken as a whole, no indication to pursue neuropsychological testing.  Lab Review:  No results found for: NA, K, CL, CO2, GLUCOSE, BUN, CREATININE, CALCIUM, PROT, ALBUMIN, AST, ALT, ALKPHOS, BILITOT, GFRNONAA, GFRAA  No results found for: WBC, RBC, HGB, HCT, PLT, MCV, MCH, MCHC, RDW, LYMPHSABS, MONOABS, EOSABS, BASOSABS  No results found for: POCLITH, LITHIUM   No results found for: PHENYTOIN, PHENOBARB, VALPROATE, CBMZ  Vitamin D level acceptable at 54.5.    Echocardiogram is stable re: AVR over the last 8 years and not likely the cause of lethargy.  .res Assessment: Plan:    Eric [name] was seen today for follow-up, anxiety, depression and fatigue.  Diagnoses and all orders for this visit:  Mixed obsessional thoughts and acts  Major depressive disorder, recurrent episode, moderate (HCC)  Restless legs syndrome (RLS)  Low vitamin D level  Obstructive sleep apnea  Erectile disorder, acquired, generalized, moderate    Greater than 50% of 30 min face to face time with patient was spent on counseling and coordination of care. We discussed Mr. Elsass has a long history of depression and OCD which are partially controlled.  He requires frequent follow-up and his request because of significant residual depression and anxiety and concerns about polypharmacy and he wants regular therapeutic advice on how to further reduce his OCD.  He believes the depression is a consequence of the residual OCD.  He has some compulsive checking and obsessions around the house maintenance.  He wishes to avoid sexual side effects and so we are keeping the SSRI at the lowest  possible dose.  He is tried all of the reasonable SSRI options with the exception possibly of sertraline but it is likely to have more sexual dysfunction than what he is taking now.  When travels then tends to have less OCD bc triggered less.    Disc goal of more completely eliminating the OCD the less prone he'll be to relapses. He is working on hoarding tendencies and this was discussed in relation to current behavioral therapy approaches.  As discussed  fortunately his anxiety has improved somewhat over time since we increased the Lexapro the last time.  Overall balance is ok between benefit and SE Lexapro for OCD at 10 mg daily.  His OCD was recently triggered by having to review a rental agreement.  Any legal things will tend to trigger his OCD but he managed with extra Xanax.  No excessive use of Xanax noted.  His OCD is persistent with checking lites, stove, etc. but it is not heavily time-consuming and is mildly to moderately distressing. Continue Lexapro 10 daily he asks about skipping Lexapro for a week.  Discussed withdrawal and tachyphylaxis and strongly recommend against doing so.  He could skip it for a couple of days today with sexual side effects on rare occasions but not on a regular basis.  Overall his level of depression is moderate better.  He is  able to find things that he enjoys and he does have interests but they are reduced below normal for him.Marland Kitchen  He is probably too inactive and not as motivated as he should be which is part of his reason for wanting to increase the Ritalin.  Discussed potential benefits, risks, and side effects of stimulants with patient to include increased heart rate, palpitations, insomnia, increased anxiety, increased irritability, or decreased appetite.  Instructed patient to contact office if experiencing any significant tolerability issues. Disc risk of increasing the Ritalin further elevating BP and pulse if we increase the Ritalin.  Need to verify that  it's not markedly elevated from taking the Ritalin. Doesn't think it's been consistently elevated. Disc crash risk.  He doesn't need feel it.   Option reduce Ritalin to lessen the worry when it wears off.   He feels that this is helpful for energy. Flexibility regarding dosing Ritalin in PM  And worry is better generally in the evening than it was.  Usually taking 30 mg daily. Disc pros and cons of daily vs prn and tolerance issues.  He plans to use it  Prn.  Thinks memory problem relate to OCD bc often counting in the in the background at rest which tends to reduce his attention.  Ritalin is being successfully used off label to augment antidepressants for depression and have resulted in improved productivity and attention.  Encourage walking and disc dieting for wieght loss.  Disc need for weight loss. Current #287.   Disc option Ozempic with PCP.  Gave copy of abstract and answered questions re: off-label use.  Option sildenafil but he doesn't want to do it.  Disc standard evaluation of fatigue. He wonders about Send copies of labs to me.  Before his next appt with me with [facility].  No problem with the switch back to ropinirole. RLS managed.  Still no complaints. Disc also dx PLMS.    Discussed cognitive behavioral techniques for managing both depressive and anxiety symptoms.  For example discussed delay in giving into the compulsion as a form of extinction and response prevention.  Discussed his delayed sleep phase syndrome which currently is only a problem because it is bothering his wife.  However this is fairly chronic and not changing.  Option Rexulti.   Continue lithium 150 mg daily for its neuro protective effect.  He remains concerned about his short-term memory as previously noted  No other med change  Follow-up 4 weeks  Meredith Staggers MD, DFAPA.  Please see After Visit Summary for patient specific instructions.  Future Appointments  Date Time Provider Department  Center  06/18/2019  1:00 PM Cottle, Steva Ready., MD [facility] None  07/17/2019 10:30 AM Cottle, Steva Ready., MD [facility] None    No orders of the defined types were placed in this encounter.     -------------------------------

## 2019-06-18 ENCOUNTER — Ambulatory Visit: Payer: No Typology Code available for payment source | Admitting: Psychiatry

## 2019-07-08 ENCOUNTER — Ambulatory Visit: Payer: No Typology Code available for payment source | Admitting: Psychiatry

## 2019-07-15 DIAGNOSIS — E669 Obesity, unspecified: Secondary | ICD-10-CM | POA: Diagnosis not present

## 2019-07-15 DIAGNOSIS — Z79899 Other long term (current) drug therapy: Secondary | ICD-10-CM | POA: Diagnosis not present

## 2019-07-15 DIAGNOSIS — E7800 Pure hypercholesterolemia, unspecified: Secondary | ICD-10-CM | POA: Diagnosis not present

## 2019-07-15 DIAGNOSIS — R7309 Other abnormal glucose: Secondary | ICD-10-CM | POA: Diagnosis not present

## 2019-07-17 ENCOUNTER — Ambulatory Visit (INDEPENDENT_AMBULATORY_CARE_PROVIDER_SITE_OTHER): Payer: No Typology Code available for payment source | Admitting: Psychiatry

## 2019-07-17 ENCOUNTER — Encounter: Payer: Self-pay | Admitting: Psychiatry

## 2019-07-17 ENCOUNTER — Other Ambulatory Visit: Payer: Self-pay

## 2019-07-17 DIAGNOSIS — F331 Major depressive disorder, recurrent, moderate: Secondary | ICD-10-CM

## 2019-07-17 DIAGNOSIS — F422 Mixed obsessional thoughts and acts: Secondary | ICD-10-CM

## 2019-07-17 DIAGNOSIS — F5221 Male erectile disorder: Secondary | ICD-10-CM

## 2019-07-17 DIAGNOSIS — G4733 Obstructive sleep apnea (adult) (pediatric): Secondary | ICD-10-CM

## 2019-07-17 DIAGNOSIS — R7989 Other specified abnormal findings of blood chemistry: Secondary | ICD-10-CM

## 2019-07-17 DIAGNOSIS — N529 Male erectile dysfunction, unspecified: Secondary | ICD-10-CM

## 2019-07-17 DIAGNOSIS — G2581 Restless legs syndrome: Secondary | ICD-10-CM

## 2019-07-17 NOTE — Progress Notes (Signed)
Eric [name] [id number] 1950-05-10 69 y.o.    Subjective:   Patient ID:  Eric [name] is a 69 y.o. (DOB 1950-05-10) male.  Chief Complaint:  Chief Complaint  Patient presents with  . Follow-up  . ADHD  . Depression  . Anxiety    Depression        Associated symptoms include fatigue and myalgias.  Associated symptoms include no decreased concentration, no headaches and no suicidal ideas.  Past medical history includes anxiety.   Medication Refill Associated symptoms include arthralgias, fatigue and myalgias. Pertinent negatives include no chest pain, fever, headaches or weakness.  Anxiety Symptoms include nervous/anxious behavior. Patient reports no chest pain, confusion, decreased concentration, dizziness, shortness of breath or suicidal ideas.      Eric [name] presents for  for follow-up of OCD and depression and med changes.  visit November 27, 2018.   No improvement in energy of lithium and it was recommended that he restart lithium 150 mg daily for his neuro protective effect.  visit December 11, 2018.  No meds were changed.  He was satisfied with the meds currently prescribed.  seen March 4,, 2021 . No med changes except he was granted some flexibility around dosing of Ritalin.. Just back from [place] visiting kids. Went well.    seen April 16, 2019.  No meds were changed.  As of May 07, 2019 he reports the following: Xanax only used 1-2 times/month. 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  Driven me crazy a little.  This is a trigger for OCD.  Xanax helped calm anxiety and help him to sleep.  Manageable OCD otherwise at the lower dose of Lexapro.  Still issues with light switches.  After longer period with less Lexapro he's had a noticed a little more obsessing but managed.  A little worsening OCD about the light switches.  But it is manageable.  Still worry over Covid but does not exacerbate OCD.  Risperidone is  infrequent. [name] says he's doing a little better with chore completion.  GS 69 yo coming to visit end of May and will play with train set.  OCD at baseline with light switches 5-10 minutes.  Had a relapse since here but it was brief.    RLS managed ok unless stays up too late.  Caffeine varies from none to 5 cups.  Infrequent Xanax.  Exercise about 3 times weekly with trainer for 30 mins-45 mins. Wife says he has fragmented sleep.  Dr. [name] says CPAP data looks pretty good.   Disc Ritalin and he thinks it's helpful for energy without SE. he feels he is a little more productive on Ritalin.  Legs are jumping. No worsening anxiety.  Still some general malaise.   Taking Ritalin 30 mg just once daily bc gets up late. Primary benefit is energy.  Still CO fatigue.  Does not take it daily.    Average 8.   Can find things he enjoys.  But not a lot of things.  Interest and enjoyment is reduced.  Sexual function is OK if he waits long enough between attempts.  Also disc effects of age and testosterone.  Disc risk of testosterone. Plan: Disc Ozempic for weight loss with PCP  05/29/2019 appt, the following noted: Increased ropinirole to 3 mg bc felt it worked better.  Rare Xanax and risperidone.   Making progress and getting things done. OCD does interfere bc doesn't want to throw things away.  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   Setting up train set for GS.   Going to bed earlier and getting  Up earlier.  Taking least necessary Ritalin so just in the AM. Depression at baseline.   Stamina is not good. Wonders about tiredness.  Stumbling too much.  Stairs are a problem but manages.   [name] in [place] state.  Attends [employer].   Plan without med changes.  07/17/19 appt with the following noted: Still checking light switches and perseverating on things and wife notieces. Lexapro 10 still causes some sexual SE and will occ skip it for sexual function. Asks about reduction. Still depressed  but not overly so. Sleep 8-10 hours. Doesn't want to increase Lexapro. Questions about lithium and Ozempic.  Concerns about lithium and blood level. Occ Xanax and rare Risperidone.  Ran out of Requip and kicked all night and stopped back on it.   Tolerating meds except Crestor. Asked questions about ropinirole dosing and effectiveness. Concerns about lethargy and wonders  Usually taking Ritalin just once daily.  B schizophrenic SUI. After M's death. PCP Kendra Opitz at [place]  Prior psychiatric medication trials include Lexapro, citalopram NR, clomipramine weight gain, paroxetine, fluoxetine, Luvox, Trintellix,   bupropion, Abilify 10 fatigue, Cerefolin NAC, and   pramipexole,  ropinirole remotely took Adderall, Ritalin 30, modafinil and Nuvigil, Increase Lexapro back to 20 mg January 2020.  Review of Systems:  Review of Systems  Constitutional: Positive for fatigue. Negative for fever.  Respiratory: Negative for shortness of breath.   Cardiovascular: Negative for chest pain.  Musculoskeletal: Positive for arthralgias and myalgias.  Neurological: Negative for dizziness, tremors, weakness and headaches.  Psychiatric/Behavioral: Positive for depression and dysphoric mood. Negative for agitation, behavioral problems, confusion, decreased concentration, hallucinations, self-injury, sleep disturbance and suicidal ideas. The patient is nervous/anxious. The patient is not hyperactive.     Medications: I have reviewed the patient's current medications.  Current Outpatient Medications  Medication Sig Dispense Refill  . ALPRAZolam (XANAX) 0.25 MG tablet Take 1 tablet (0.25 mg total) by mouth 2 (two) times daily as needed for anxiety or sleep. 30 tablet 1  . aspirin 81 MG tablet Take 81 mg by mouth daily.    Marland Kitchen buPROPion (WELLBUTRIN XL) 150 MG 24 hr tablet Take 3 tablets (450 mg total) by mouth daily. 270 tablet 1  . Cholecalciferol (VITAMIN D-3) 5000 units TABS Take 5,000 Units by  mouth daily.     . Coenzyme Q10 200 MG TABS Take 300 mg by mouth daily.     . Cyanocobalamin (VITAMIN B 12) 500 MCG TABS Take 1,000 mcg by mouth.     . desonide (DESOWEN) 0.05 % ointment   0  . econazole nitrate 1 % cream Apply topically daily.    Marland Kitchen escitalopram (LEXAPRO) 20 MG tablet TAKE 1 TABLET BY MOUTH EVERY DAY (Patient taking differently: 10 mg. ) 90 tablet 0  . Ezetimibe-Simvastatin (VYTORIN PO) Take by mouth.    . lactase (LACTAID) 3000 units tablet Take 3,000 Units by mouth as needed.     . lithium carbonate 150 MG capsule Take 1 capsule (150 mg total) by mouth at bedtime. 90 capsule 1  . methylphenidate (RITALIN) 10 MG tablet 3 tablet in the morning and 3 at noon 180 tablet 0  . methylphenidate (RITALIN) 10 MG tablet Take 3 tablets (30 mg total) by mouth 2 (two) times daily. 180 tablet 0  . methylphenidate (RITALIN) 10 MG tablet Take 3 tablets (30  mg total) by mouth 2 (two) times daily. 180 tablet 0  . mupirocin ointment (BACTROBAN) 2 %   1  . risperiDONE (RISPERDAL) 0.25 MG tablet Take 0.25 mg by mouth daily as needed (tends to brighten mood when needed).     Marland Kitchen rOPINIRole (REQUIP) 1 MG tablet TAKE 3 TABLETS (3 MG TOTAL) BY MOUTH AT BEDTIME. (Patient taking differently: Take 3 mg by mouth at bedtime. ) 270 tablet 0   No current facility-administered medications for this visit.    Medication Side Effects: None sexual SE are better not  All gone.  Allergies:  Allergies  Allergen Reactions  . Erythromycin Hives    Past Medical History:  Diagnosis Date  . Allergy   . Anemia    iron  . Carotid artery occlusion   . Hyperlipidemia   . Obesity   . OCD (obsessive compulsive disorder)   . Periodic limb movement disorder   . Sleep apnea     Family History  Problem Relation Age of Onset  . Cancer Mother        breast and ovarian  . Depression Father        bi-polar  . Depression Son     Social History   Socioeconomic History  . Marital status: Married    Spouse  name: Not on file  . Number of children: Not on file  . Years of education: Not on file  . Highest education level: Not on file  Occupational History  . Not on file  Tobacco Use  . Smoking status: Never Smoker  . Smokeless tobacco: Never Used  Substance and Sexual Activity  . Alcohol use: No  . Drug use: No  . Sexual activity: Not on file  Other Topics Concern  . Not on file  Social History Narrative  . Not on file   Social Determinants of Health   Financial Resource Strain:   . Difficulty of Paying Living Expenses:   Food Insecurity:   . Worried About Programme researcher, broadcasting/film/video in the Last Year:   . Barista in the Last Year:   Transportation Needs:   . Freight forwarder (Medical):   Marland Kitchen Lack of Transportation (Non-Medical):   Physical Activity:   . Days of Exercise per Week:   . Minutes of Exercise per Session:   Stress:   . Feeling of Stress :   Social Connections:   . Frequency of Communication with Friends and Family:   . Frequency of Social Gatherings with Friends and Family:   . Attends Religious Services:   . Active Member of Clubs or Organizations:   . Attends Banker Meetings:   Marland Kitchen Marital Status:   Intimate Partner Violence:   . Fear of Current or Ex-Partner:   . Emotionally Abused:   Marland Kitchen Physically Abused:   . Sexually Abused:     Past Medical History, Surgical history, Social history, and Family history were reviewed and updated as appropriate.   Please see review of systems for further details on the patient's review from today.   Objective:   Physical Exam:  There were no vitals taken for this visit.  Physical Exam Constitutional:      General: He is not in acute distress.    Appearance: He is well-developed. He is obese.  Musculoskeletal:        General: No deformity.  Neurological:     Mental Status: He is alert and oriented to person, place, and time.  Cranial Nerves: No dysarthria.     Motor: No tremor.      Coordination: Coordination normal.  Psychiatric:        Attention and Perception: Attention and perception normal. He does not perceive auditory or visual hallucinations.        Mood and Affect: Mood is anxious and depressed. Affect is not labile, blunt, angry or inappropriate.        Speech: Speech normal. Speech is not rapid and pressured or slurred.        Behavior: Behavior normal. Behavior is cooperative.        Thought Content: Thought content normal. Thought content is not paranoid or delusional. Thought content does not include homicidal or suicidal ideation. Thought content does not include homicidal or suicidal plan.        Cognition and Memory: Cognition and memory normal.        Judgment: Judgment normal.     Comments: Obsessing but manageable. Anxiety is a little worse especially towards the evening  .  Some concerns on meds. Fidgety chronically varies intensity    November 06, 2018: [place] Cog test in office within normal limits MMSE 28/30. Animal fluency 17 . (borderline) Taken as a whole, no indication to pursue neuropsychological testing.  Lab Review:  No results found for: NA, K, CL, CO2, GLUCOSE, BUN, CREATININE, CALCIUM, PROT, ALBUMIN, AST, ALT, ALKPHOS, BILITOT, GFRNONAA, GFRAA  No results found for: WBC, RBC, HGB, HCT, PLT, MCV, MCH, MCHC, RDW, LYMPHSABS, MONOABS, EOSABS, BASOSABS  No results found for: POCLITH, LITHIUM   No results found for: PHENYTOIN, PHENOBARB, VALPROATE, CBMZ  Vitamin D level acceptable at 54.5.    Echocardiogram is stable re: AVR over the last 8 years and not likely the cause of lethargy.  .res Assessment: Plan:    Korie was seen today for follow-up, adhd, depression and anxiety.  Diagnoses and all orders for this visit:  Major depressive disorder, recurrent episode, moderate (HCC)  Mixed obsessional thoughts and acts  Restless legs syndrome (RLS)  Restless legs syndrome  Obstructive sleep apnea  Erectile disorder,  acquired, generalized, moderate  Low vitamin D level  Inability to maintain erection    Greater than 50% of 30 min face to face time with patient was spent on counseling and coordination of care. We discussed Mr. Depaul has a long history of depression and OCD which are partially controlled.  He requires frequent follow-up and his request because of significant residual depression and anxiety and concerns about polypharmacy and he wants regular therapeutic advice on how to further reduce his OCD.  He believes the depression is a consequence of the residual OCD.  He has some compulsive checking and obsessions around the house maintenance.  He wishes to avoid sexual side effects and so we are keeping the SSRI at the lowest possible dose.  He is tried all of the reasonable SSRI options with the exception possibly of sertraline but it is likely to have more sexual dysfunction than what he is taking now.  When travels then tends to have less OCD bc triggered less.    Disc goal of more completely eliminating the OCD the less prone he'll be to relapses. He is working on hoarding tendencies and this was discussed in relation to current behavioral therapy approaches.  As discussed  fortunately his anxiety has improved somewhat over time since we increased the Lexapro the last time.  Overall balance is ok between benefit and SE Lexapro for OCD at 10 mg  daily.  His OCD was recently triggered by having to review a rental agreement.  Any legal things will tend to trigger his OCD but he managed with extra Xanax.  No excessive use of Xanax noted.  His OCD is persistent with checking lites, stove, etc. but it is not heavily time-consuming and is mildly to moderately distressing. Continue Lexapro 10 daily he asks about skipping Lexapro for a week.  Discussed withdrawal and tachyphylaxis and strongly recommend against doing so.  He could skip it for a couple of days today with sexual side effects on rare occasions  but not on a regular basis.  Overall his level of depression is moderate better.  He is able to find things that he enjoys and he does have interests but they are reduced below normal for him.Marland Kitchen  He is probably too inactive and not as motivated as he should be which is part of his reason for wanting to increase the Ritalin.  Discussed potential benefits, risks, and side effects of stimulants with patient to include increased heart rate, palpitations, insomnia, increased anxiety, increased irritability, or decreased appetite.  Instructed patient to contact office if experiencing any significant tolerability issues. Disc risk of increasing the Ritalin further elevating BP and pulse if we increase the Ritalin.  Need to verify that it's not markedly elevated from taking the Ritalin. Doesn't think it's been consistently elevated. Disc crash risk.  He doesn't need feel it.   Option reduce Ritalin to lessen the worry when it wears off.   He feels that this is helpful for energy. Flexibility regarding dosing Ritalin in PM  And worry is better generally in the evening than it was.  Usually taking 30 mg daily. Disc pros and cons of daily vs prn and tolerance issues.  He plans to use it  Prn.  Thinks memory problem relate to OCD bc often counting in the in the background at rest which tends to reduce his attention.  Ritalin is being successfully used off label to augment antidepressants for depression and have resulted in improved productivity and attention.  Encourage walking and disc dieting for wieght loss.  Disc need for weight loss. Current #287.   Disc option Ozempic with PCP.  Gave copy of abstract and answered questions re: off-label use.  Option sildenafil but he doesn't want to do it.  Disc standard evaluation of fatigue. He wonders about Send copies of labs to me.  Before his next appt with me with [facility].  No problem with the switch back to ropinirole. RLS managed.  Still no complaints.  Disc also dx PLMS.    Discussed cognitive behavioral techniques for managing both depressive and anxiety symptoms.  For example discussed delay in giving into the compulsion as a form of extinction and response prevention.  Discussed his delayed sleep phase syndrome which currently is only a problem because it is bothering his wife.  However this is fairly chronic and not changing.  Option Rexulti.  Plans to start Ozempic.  Some concerns about it.  Answered questions about it and the cost benefit analysis and the weight loss study  May stop lithium for awhile if he desires.  Option Continue lithium 150 mg daily for its neuro protective effect.  He remains concerned about his short-term memory as previously noted He got lithium level at [facility] on 07/16/19.  Disc his concerns.  Level like to be nondetectible bc dose is so low. Disc potential effect on calcium and CR as primary things  to monitor. Counseled patient regarding potential benefits, risks, and side effects of lithium to include potential risk of lithium affecting thyroid and renal function.  Discussed need for periodic lab monitoring to determine drug level and to assess for potential adverse effects.  Counseled patient regarding signs and symptoms of lithium toxicity and advised that they notify office immediately or seek urgent medical attention if experiencing these signs and symptoms.  Patient advised to contact office with any questions or concerns.  No other med change  Follow-up 4 weeks  Lynder Parents MD, DFAPA.  Please see After Visit Summary for patient specific instructions.  Future Appointments  Date Time Provider [place]  08/10/2019  1:00 PM Cottle, Billey Co., MD [facility] None  09/17/2019 11:30 AM Cottle, Billey Co., MD [facility] None  10/15/2019  1:30 PM Cottle, Billey Co., MD [facility] None    No orders of the defined types were placed in this encounter.     -------------------------------

## 2019-07-22 ENCOUNTER — Ambulatory Visit: Payer: BLUE CROSS/BLUE SHIELD | Admitting: Family Medicine

## 2019-07-22 ENCOUNTER — Ambulatory Visit: Payer: Self-pay

## 2019-07-22 ENCOUNTER — Encounter: Payer: Self-pay | Admitting: Family Medicine

## 2019-07-22 ENCOUNTER — Ambulatory Visit (INDEPENDENT_AMBULATORY_CARE_PROVIDER_SITE_OTHER): Payer: BLUE CROSS/BLUE SHIELD

## 2019-07-22 ENCOUNTER — Other Ambulatory Visit: Payer: Self-pay

## 2019-07-22 VITALS — BP 130/70 | HR 80 | Ht 71.0 in | Wt 286.4 lb

## 2019-07-22 DIAGNOSIS — M25552 Pain in left hip: Secondary | ICD-10-CM | POA: Diagnosis not present

## 2019-07-22 DIAGNOSIS — M160 Bilateral primary osteoarthritis of hip: Secondary | ICD-10-CM | POA: Diagnosis not present

## 2019-07-22 DIAGNOSIS — M25559 Pain in unspecified hip: Secondary | ICD-10-CM | POA: Diagnosis not present

## 2019-07-22 DIAGNOSIS — M79661 Pain in right lower leg: Secondary | ICD-10-CM | POA: Diagnosis not present

## 2019-07-22 DIAGNOSIS — M25551 Pain in right hip: Secondary | ICD-10-CM

## 2019-07-22 NOTE — Progress Notes (Signed)
Subjective:    CC: R calf pain  I, Molly Weber, LAT, ATC, am serving as scribe for Dr. Clementeen Graham.  HPI: Pt is a 69 y/o male presenting w/ c/o L calf pain x approximately 2 weeks after injuring himself jumping off of a diving board.  He locates his pain to his R medial calf near where the gastroc and the soleus intersect.  He reports having limped for about 5 days but pain has resolved w/ walking  Radiating pain: no Swelling: slight Aggravating factors: pressure to the area;  Treatments tried: ice, Aleve initially but not any longer  B hip pain: Chronic.  Also has hx of chronic B knee pain and has had cortizone injections in his knees.  He also had a course of PT for his knees.  He states that he has very tight hip flexors and occasional pain.  Pertinent review of Systems: No fevers or chills  Relevant historical information: Hypertension   Objective:    Vitals:   07/22/19 1321  BP: 130/70  Pulse: 80  SpO2: 98%   General: Well Developed, well nourished, and in no acute distress.   MSK: Hips bilaterally normal-appearing decreased range of motion to internal rotation with some pain. Right calf slight bruise at medial gastrocnemius muscle tendinous junction. Tender palpation in this region as well. Normal strength. No palpable cords.  Lab and Radiology Results  X-ray images bilateral hips obtained today personally and independently reviewed Bilateral hip DJD mild to moderate with some acetabular spurring present.  No acute fractures. Await formal radiology review  Diagnostic Limited MSK Ultrasound of: Right calf Right medial head gastrocnemius muscle tendinous junction with slight hypoechoic fluid tracking superficial to muscle to insertion with disruption of pennate muscle fibers consistent with small partial tear medial head. Impression: Head gastrocnemius strain/tear.    Impression and Recommendations:    Assessment and Plan: 69 y.o. male with right calf  strain.  Already improving 2 weeks then.  Plan to treat conservatively with eccentric exercises.  Patient already sees a physical therapist on his own.  Recommend adding eccentric exercise to the protocol  Hip tightness: Radiology over read of my x-ray interpretation is still pending however I am concerned for DJD and femoral acetabular impingement.  Reasonable to try some physical therapy activities however I am somewhat less optimistic that these will be helpful.  Recheck back as needed..   Orders Placed This Encounter  Procedures  . [place] LIMITED JOINT SPACE STRUCTURES LOW BILAT(NO LINKED CHARGES)    Order Specific Question:   Reason for Exam (SYMPTOM  OR DIAGNOSIS REQUIRED)    Answer:   r calf    Order Specific Question:   Preferred imaging location?    Answer:   [facility]ult [facility]  . DG HIP UNILAT WITH PELVIS 2-3 VIEWS RIGHT    Standing Status:   Future    Number of Occurrences:   1    Standing Expiration Date:   07/21/2020    Order Specific Question:   Reason for Exam (SYMPTOM  OR DIAGNOSIS REQUIRED)    Answer:   eval hip rightness    Order Specific Question:   Preferred imaging location?    Answer:   [facility]    Order Specific Question:   Radiology Contrast Protocol - do NOT remove file path    Answer:   [email or web address]  . DG Hip Unilat W OR W/O Pelvis 2-3 Views Left    Standing Status:  Future    Number of Occurrences:   1    Standing Expiration Date:   07/21/2020    Order Specific Question:   Reason for Exam (SYMPTOM  OR DIAGNOSIS REQUIRED)    Answer:   eval hip tightness    Order Specific Question:   Preferred imaging location?    Answer:   [facility]    Order Specific Question:   Radiology Contrast Protocol - do NOT remove file path    Answer:   \\[email or web address]   No orders of the defined types were placed in this encounter.   Discussed warning signs  or symptoms. Please see discharge instructions. Patient expresses understanding.   The above documentation has been reviewed and is accurate and complete Clementeen Graham, M.D.

## 2019-07-22 NOTE — Progress Notes (Signed)
Note duplication 

## 2019-07-22 NOTE — Patient Instructions (Signed)
Thank you for coming in today. Plan for xray today.  You should hear from my office tomorrow or Friday about the results of the xray.  Your report and my note will be available in mychart shortly.  Please show my note and the xray to your PT.    Recheck as needed.

## 2019-07-23 DIAGNOSIS — H52203 Unspecified astigmatism, bilateral: Secondary | ICD-10-CM | POA: Diagnosis not present

## 2019-07-23 DIAGNOSIS — H2513 Age-related nuclear cataract, bilateral: Secondary | ICD-10-CM | POA: Diagnosis not present

## 2019-07-23 DIAGNOSIS — H524 Presbyopia: Secondary | ICD-10-CM | POA: Diagnosis not present

## 2019-07-23 DIAGNOSIS — H5213 Myopia, bilateral: Secondary | ICD-10-CM | POA: Diagnosis not present

## 2019-07-23 DIAGNOSIS — D3131 Benign neoplasm of right choroid: Secondary | ICD-10-CM | POA: Diagnosis not present

## 2019-07-23 NOTE — Progress Notes (Signed)
No fractures.  Medium arthritis or narrowing present in left hip.

## 2019-07-23 NOTE — Progress Notes (Signed)
No fractures present.  Medium narrowing arthritis present right hip.

## 2019-07-28 ENCOUNTER — Encounter: Payer: Self-pay | Admitting: Family Medicine

## 2019-08-10 ENCOUNTER — Other Ambulatory Visit: Payer: Self-pay

## 2019-08-10 ENCOUNTER — Encounter: Payer: Self-pay | Admitting: Psychiatry

## 2019-08-10 ENCOUNTER — Ambulatory Visit (INDEPENDENT_AMBULATORY_CARE_PROVIDER_SITE_OTHER): Payer: No Typology Code available for payment source | Admitting: Psychiatry

## 2019-08-10 DIAGNOSIS — F5221 Male erectile disorder: Secondary | ICD-10-CM

## 2019-08-10 DIAGNOSIS — F331 Major depressive disorder, recurrent, moderate: Secondary | ICD-10-CM

## 2019-08-10 DIAGNOSIS — F422 Mixed obsessional thoughts and acts: Secondary | ICD-10-CM | POA: Diagnosis not present

## 2019-08-10 DIAGNOSIS — G2581 Restless legs syndrome: Secondary | ICD-10-CM | POA: Diagnosis not present

## 2019-08-10 DIAGNOSIS — G4733 Obstructive sleep apnea (adult) (pediatric): Secondary | ICD-10-CM

## 2019-08-10 NOTE — Progress Notes (Signed)
Eric [name] [id number] 09/12/1950 69 y.o.    Subjective:   Patient ID:  Eric [name] is a 69 y.o. (DOB 1950/12/24) male.  Chief Complaint:  Chief Complaint  Patient presents with  . Follow-up  . Anxiety  . Depression    Depression        Associated symptoms include fatigue and myalgias.  Associated symptoms include no decreased concentration, no headaches and no suicidal ideas.  Past medical history includes anxiety.   Medication Refill Associated symptoms include arthralgias, fatigue and myalgias. Pertinent negatives include no chest pain, fever, headaches or weakness.  Anxiety Symptoms include nervous/anxious behavior. Patient reports no chest pain, confusion, decreased concentration, dizziness, palpitations, shortness of breath or suicidal ideas.      [name] Hastings presents for  for follow-up of OCD and depression and med changes.  visit November 27, 2018.   No improvement in energy of lithium and it was recommended that he restart lithium 150 mg daily for his neuro protective effect.  visit December 11, 2018.  No meds were changed.  He was satisfied with the meds currently prescribed.  seen March 4,, 2021 . No med changes except he was granted some flexibility around dosing of Ritalin.. Just back from [place] visiting kids. Went well.    seen April 16, 2019.  No meds were changed.  As of May 07, 2019 he reports the following: Xanax only used 1-2 times/month. 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  Driven me crazy a little.  This is a trigger for OCD.  Xanax helped calm anxiety and help him to sleep.  Manageable OCD otherwise at the lower dose of Lexapro.  Still issues with light switches.  After longer period with less Lexapro he's had a noticed a little more obsessing but managed.  A little worsening OCD about the light switches.  But it is manageable.  Still worry over Covid but does not exacerbate OCD.  Risperidone is  infrequent. [name] says he's doing a little better with chore completion.  GS 69 yo coming to visit end of May and will play with train set.  OCD at baseline with light switches 5-10 minutes.  Had a relapse since here but it was brief.    RLS managed ok unless stays up too late.  Caffeine varies from none to 5 cups.  Infrequent Xanax.  Exercise about 3 times weekly with trainer for 30 mins-45 mins. Wife says he has fragmented sleep.  [name] says CPAP data looks pretty good.   Disc Ritalin and he thinks it's helpful for energy without SE. he feels he is a little more productive on Ritalin.  Legs are jumping. No worsening anxiety.  Still some general malaise.   Taking Ritalin 30 mg just once daily bc gets up late. Primary benefit is energy.  Still CO fatigue.  Does not take it daily.    Average 8.   Can find things he enjoys.  But not a lot of things.  Interest and enjoyment is reduced.  Sexual function is OK if he waits long enough between attempts.  Also disc effects of age and testosterone.  Disc risk of testosterone. Plan: Disc Ozempic for weight loss with PCP  05/29/2019 appt, the following noted: Increased ropinirole to 3 mg bc felt it worked better.  Rare Xanax and risperidone.   Making progress and getting things done. OCD does interfere bc doesn't want to throw things away.  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   Setting up train set for GS.   Going to bed earlier and getting  Up earlier.  Taking least necessary Ritalin so just in the AM. Depression at baseline.   Stamina is not good. Wonders about tiredness.  Stumbling too much.  Stairs are a problem but manages.   [name] in [place] state.  Attends [employer].   Plan without med changes.  07/17/19 appt with the following noted: Still checking light switches and perseverating on things and wife notieces. Lexapro 10 still causes some sexual SE and will occ skip it for sexual function. Asks about reduction. Still depressed  but not overly so. Sleep 8-10 hours. Doesn't want to increase Lexapro. Questions about lithium and Ozempic.  Concerns about lithium and blood level. Occ Xanax and rare Risperidone.  Ran out of Requip and kicked all night and stopped back on it.   Tolerating meds except Crestor. Asked questions about ropinirole dosing and effectiveness. Concerns about lethargy Usually taking Ritalin just once daily. No med changes.  08/10/19 appt with the following noted: Overall about the same and no worse.  Residual OCD unchanged.  Esp checks light switches.   Working on going to sleep earlier and up earlier bc wife says he has better energy in that situation than if stays up later. Disc weight loss concerns. Sleep unchanged. CPAP doc soon.  Disc brain and health concerns.   Depression, anxiety unchanged markedly.  A little more anxious in the PM. Taking Ritalin about half the time.  Doesn't think he withdraws. Coffee varies 1 cup to 4-5 daily.  Tolerates it. Disc questions about generics of Wellbutrin.  B schizophrenic SUI. After M's death. PCP Kendra Opitz at [facility] at 69 years old.  Prior psychiatric medication trials include Lexapro, citalopram NR, clomipramine weight gain, paroxetine, fluoxetine, Luvox, Trintellix,   bupropion, Abilify 10 fatigue, Cerefolin NAC, and   pramipexole,  ropinirole remotely took Adderall, Ritalin 30, modafinil and Nuvigil, Increase Lexapro back to 20 mg January 2020.  Review of Systems:  Review of Systems  Constitutional: Positive for fatigue. Negative for fever.  Respiratory: Negative for shortness of breath.   Cardiovascular: Negative for chest pain and palpitations.  Musculoskeletal: Positive for arthralgias and myalgias.  Neurological: Negative for dizziness, tremors, weakness and headaches.  Psychiatric/Behavioral: Positive for depression and dysphoric mood. Negative for agitation, behavioral problems, confusion, decreased  concentration, hallucinations, self-injury, sleep disturbance and suicidal ideas. The patient is nervous/anxious. The patient is not hyperactive.     Medications: I have reviewed the patient's current medications.  Current Outpatient Medications  Medication Sig Dispense Refill  . ALPRAZolam (XANAX) 0.25 MG tablet Take 1 tablet (0.25 mg total) by mouth 2 (two) times daily as needed for anxiety or sleep. 30 tablet 1  . aspirin 81 MG tablet Take 81 mg by mouth daily.    Marland Kitchen buPROPion (WELLBUTRIN XL) 150 MG 24 hr tablet Take 3 tablets (450 mg total) by mouth daily. 270 tablet 1  . Cholecalciferol (VITAMIN D-3) 5000 units TABS Take 5,000 Units by mouth daily.     . Coenzyme Q10 200 MG TABS Take 300 mg by mouth daily.     . Cyanocobalamin (VITAMIN B 12) 500 MCG TABS Take 1,000 mcg by mouth.     . desonide (DESOWEN) 0.05 % ointment   0  . econazole nitrate 1 % cream Apply topically daily.    Marland Kitchen escitalopram (LEXAPRO) 20 MG tablet TAKE 1 TABLET  BY MOUTH EVERY DAY (Patient taking differently: 10 mg. ) 90 tablet 0  . Ezetimibe-Simvastatin (VYTORIN PO) Take by mouth.    . lactase (LACTAID) 3000 units tablet Take 3,000 Units by mouth as needed.     . lithium carbonate 150 MG capsule Take 1 capsule (150 mg total) by mouth at bedtime. 90 capsule 1  . methylphenidate (RITALIN) 10 MG tablet 3 tablet in the morning and 3 at noon 180 tablet 0  . methylphenidate (RITALIN) 10 MG tablet Take 3 tablets (30 mg total) by mouth 2 (two) times daily. 180 tablet 0  . methylphenidate (RITALIN) 10 MG tablet Take 3 tablets (30 mg total) by mouth 2 (two) times daily. 180 tablet 0  . mupirocin ointment (BACTROBAN) 2 %   1  . risperiDONE (RISPERDAL) 0.25 MG tablet Take 0.25 mg by mouth daily as needed (tends to brighten mood when needed).     Marland Kitchen. rOPINIRole (REQUIP) 1 MG tablet TAKE 3 TABLETS (3 MG TOTAL) BY MOUTH AT BEDTIME. (Patient taking differently: Take 3 mg by mouth at bedtime. ) 270 tablet 0  . simvastatin (ZOCOR) 20 MG  tablet Take 20 mg by mouth at bedtime.     No current facility-administered medications for this visit.    Medication Side Effects: None sexual SE are better not  All gone.  Allergies:  Allergies  Allergen Reactions  . Erythromycin Hives    Past Medical History:  Diagnosis Date  . Allergy   . Anemia    iron  . Carotid artery occlusion   . Hyperlipidemia   . Obesity   . OCD (obsessive compulsive disorder)   . Periodic limb movement disorder   . Sleep apnea     Family History  Problem Relation Age of Onset  . Cancer Mother        breast and ovarian  . Depression Father        bi-polar  . Depression Son     Social History   Socioeconomic History  . Marital status: Married    Spouse name: Not on file  . Number of children: Not on file  . Years of education: Not on file  . Highest education level: Not on file  Occupational History  . Not on file  Tobacco Use  . Smoking status: Never Smoker  . Smokeless tobacco: Never Used  Substance and Sexual Activity  . Alcohol use: No  . Drug use: No  . Sexual activity: Not on file  Other Topics Concern  . Not on file  Social History Narrative  . Not on file   Social Determinants of Health   Financial Resource Strain:   . Difficulty of Paying Living Expenses:   Food Insecurity:   . Worried About Programme researcher, broadcasting/film/videounning Out of Food in the Last Year:   . Baristaan Out of Food in the Last Year:   Transportation Needs:   . Freight forwarderLack of Transportation (Medical):   Marland Kitchen. Lack of Transportation (Non-Medical):   Physical Activity:   . Days of Exercise per Week:   . Minutes of Exercise per Session:   Stress:   . Feeling of Stress :   Social Connections:   . Frequency of Communication with Friends and Family:   . Frequency of Social Gatherings with Friends and Family:   . Attends Religious Services:   . Active Member of Clubs or Organizations:   . Attends BankerClub or Organization Meetings:   Marland Kitchen. Marital Status:   Intimate Partner Violence:   .  Fear of  Current or Ex-Partner:   . Emotionally Abused:   Marland Kitchen Physically Abused:   . Sexually Abused:     Past Medical History, Surgical history, Social history, and Family history were reviewed and updated as appropriate.   Please see review of systems for further details on the patient's review from today.   Objective:   Physical Exam:  There were no vitals taken for this visit.  Physical Exam Constitutional:      General: He is not in acute distress.    Appearance: He is well-developed. He is obese.  Musculoskeletal:        General: No deformity.  Neurological:     Mental Status: He is alert and oriented to person, place, and time.     Cranial Nerves: No dysarthria.     Motor: No tremor.     Coordination: Coordination normal.  Psychiatric:        Attention and Perception: Attention and perception normal. He does not perceive auditory or visual hallucinations.        Mood and Affect: Mood is anxious and depressed. Affect is not labile, blunt, angry or inappropriate.        Speech: Speech normal. Speech is not rapid and pressured or slurred.        Behavior: Behavior normal. Behavior is cooperative.        Thought Content: Thought content normal. Thought content is not paranoid or delusional. Thought content does not include homicidal or suicidal ideation. Thought content does not include homicidal or suicidal plan.        Cognition and Memory: Cognition and memory normal.        Judgment: Judgment normal.     Comments: Obsessing but manageable. Anxiety is a little worse especially towards the evening  .  Overall residual chronic obsessions and compulsions Fidgety chronically varies intensity     November 06, 2018: [place] Cog test in office within normal limits MMSE 28/30. Animal fluency 17 . (borderline) Taken as a whole, no indication to pursue neuropsychological testing.  Lab Review:  No results found for: NA, K, CL, CO2, GLUCOSE, BUN, CREATININE, CALCIUM, PROT, ALBUMIN, AST,  ALT, ALKPHOS, BILITOT, GFRNONAA, GFRAA  No results found for: WBC, RBC, HGB, HCT, PLT, MCV, MCH, MCHC, RDW, LYMPHSABS, MONOABS, EOSABS, BASOSABS  No results found for: POCLITH, LITHIUM   No results found for: PHENYTOIN, PHENOBARB, VALPROATE, CBMZ  Vitamin D level acceptable at 54.5.    Echocardiogram is stable re: AVR over the last 8 years and not likely the cause of lethargy.  .res Assessment: Plan:    Reynald was seen today for follow-up, anxiety and depression.  Diagnoses and all orders for this visit:  Major depressive disorder, recurrent episode, moderate (HCC)  Mixed obsessional thoughts and acts  Restless legs syndrome (RLS)  Obstructive sleep apnea  Erectile disorder, acquired, generalized, moderate    Greater than 50% of 30 min face to face time with patient was spent on counseling and coordination of care. We discussed Mr. Loconte has a long history of depression and OCD which are partially controlled.  He requires frequent follow-up and his request because of significant residual depression and anxiety and concerns about polypharmacy and he wants regular therapeutic advice on how to further reduce his OCD.  He believes the depression is a consequence of the residual OCD.  He has some compulsive checking and obsessions around the house maintenance.  He wishes to avoid sexual side effects and so we are keeping  the SSRI at the lowest possible dose.  He is tried all of the reasonable SSRI options with the exception possibly of sertraline but it is likely to have more sexual dysfunction than what he is taking now.  When travels then tends to have less OCD bc triggered less.    Overall he is satisfied with the benefit to side effect ratio of Lexapro 10 mg versus the 20 mg which caused more sexual side effects. His OCD is persistent with checking lites, stove, etc. but it is not heavily time-consuming and is mildly to moderately distressing. Continue Lexapro 10 daily he asks  about skipping Lexapro for a week.    Overall his level of depression is mild to moderate with good general functioning.Marland Kitchen  He is able to find things that he enjoys and he does have interests but they are reduced below normal for him.Marland Kitchen  He is probably too inactive and not as motivated as he should be which is part of his reason for wanting to increase the Ritalin.  Discussed potential benefits, risks, and side effects of stimulants with patient to include increased heart rate, palpitations, insomnia, increased anxiety, increased irritability, or decreased appetite.  Instructed patient to contact office if experiencing any significant tolerability issues. Disc risk of increasing the Ritalin further elevating BP and pulse if we increase the Ritalin.  Need to verify that it's not markedly elevated from taking the Ritalin. Doesn't think it's been consistently elevated. Disc crash risk.  He doesn't need feel it.   Option reduce Ritalin to lessen the worry when it wears off.   He feels that this is helpful for energy. Flexibility regarding dosing Ritalin in PM  And worry is better generally in the evening than it was.  Usually taking 30 mg daily. Disc pros and cons of daily vs prn and tolerance issues.  He plans to use it  Prn.  Thinks memory problem relate to OCD bc often counting in the in the background at rest which tends to reduce his attention.  Ritalin is being successfully used off label to augment antidepressants for depression and have resulted in improved productivity and attention.  Previous screening of memory was not suggestive of any neuro degenerative process.  Encourage walking and disc dieting for wieght loss.  Disc need for weight loss. Current #287.   Disc option Ozempic with PCP.  Gave copy of abstract and answered questions re: off-label use. Plans to start Ozempic.  Some concerns about it.  Answered questions about it and the cost benefit analysis and the weight loss study Answered  questions about other weight loss meds in detail.  Option sildenafil but he doesn't want to do it.  No problem with the switch back to ropinirole. RLS managed.  Still no complaints. Disc also dx PLMS.    Option Rexulti.   No other med change  Follow-up 4 weeks  Meredith Staggers MD, DFAPA.  Please see After Visit Summary for patient specific instructions.  Future Appointments  Date Time Provider Department Center  09/17/2019 11:30 AM Cottle, Steva Ready., MD [facility] None  10/15/2019  1:30 PM Cottle, Steva Ready., MD [facility] None  11/12/2019  1:30 PM Cottle, Steva Ready., MD [facility] None  12/10/2019  1:30 PM Cottle, Steva Ready., MD [facility] None    No orders of the defined types were placed in this encounter.     -------------------------------

## 2019-08-13 DIAGNOSIS — G4733 Obstructive sleep apnea (adult) (pediatric): Secondary | ICD-10-CM | POA: Diagnosis not present

## 2019-08-24 ENCOUNTER — Encounter: Payer: Self-pay | Admitting: Psychiatry

## 2019-09-10 ENCOUNTER — Encounter: Payer: Self-pay | Admitting: Genetic Counselor

## 2019-09-10 DIAGNOSIS — H00021 Hordeolum internum right upper eyelid: Secondary | ICD-10-CM | POA: Diagnosis not present

## 2019-09-10 DIAGNOSIS — Z79899 Other long term (current) drug therapy: Secondary | ICD-10-CM | POA: Diagnosis not present

## 2019-09-10 DIAGNOSIS — E7800 Pure hypercholesterolemia, unspecified: Secondary | ICD-10-CM | POA: Diagnosis not present

## 2019-09-17 ENCOUNTER — Ambulatory Visit (INDEPENDENT_AMBULATORY_CARE_PROVIDER_SITE_OTHER): Payer: No Typology Code available for payment source | Admitting: Psychiatry

## 2019-09-17 ENCOUNTER — Other Ambulatory Visit: Payer: Self-pay

## 2019-09-17 DIAGNOSIS — F422 Mixed obsessional thoughts and acts: Secondary | ICD-10-CM

## 2019-09-17 DIAGNOSIS — G2581 Restless legs syndrome: Secondary | ICD-10-CM | POA: Diagnosis not present

## 2019-09-17 DIAGNOSIS — G4733 Obstructive sleep apnea (adult) (pediatric): Secondary | ICD-10-CM | POA: Diagnosis not present

## 2019-09-17 DIAGNOSIS — F331 Major depressive disorder, recurrent, moderate: Secondary | ICD-10-CM | POA: Diagnosis not present

## 2019-09-17 DIAGNOSIS — F5221 Male erectile disorder: Secondary | ICD-10-CM

## 2019-09-17 DIAGNOSIS — R7989 Other specified abnormal findings of blood chemistry: Secondary | ICD-10-CM

## 2019-09-17 MED ORDER — METHYLPHENIDATE HCL 10 MG PO TABS: 30.0000 mg | ORAL_TABLET | Freq: Two times a day (BID) | ORAL | 0 refills | Status: DC

## 2019-09-17 MED ORDER — METHYLPHENIDATE HCL 10 MG PO TABS: ORAL_TABLET | ORAL | 0 refills | Status: DC

## 2019-09-17 MED ORDER — ALPRAZOLAM 0.25 MG PO TABS: 0.2500 mg | ORAL_TABLET | Freq: Two times a day (BID) | ORAL | 1 refills | Status: DC | PRN

## 2019-09-17 NOTE — Progress Notes (Signed)
Eric [name] [id number] 1950-11-02 69 y.o.    Subjective:   Patient ID:  Eric [name] is a 69 y.o. (DOB 1950-11-02) male.  Chief Complaint:  Chief Complaint  Patient presents with  . Follow-up  . Depression  . Anxiety  . ADHD    Depression        Associated symptoms include fatigue and myalgias.  Associated symptoms include no decreased concentration, no headaches and no suicidal ideas.  Past medical history includes anxiety.   Medication Refill Associated symptoms include arthralgias, fatigue and myalgias. Pertinent negatives include no chest pain, fever, headaches or weakness.  Anxiety Symptoms include nervous/anxious behavior. Patient reports no chest pain, confusion, decreased concentration, dizziness, palpitations, shortness of breath or suicidal ideas.      Eric [name] presents for  for follow-up of OCD and depression and med changes.  visit November 27, 2018.   No improvement in energy of lithium and it was recommended that he restart lithium 150 mg daily for his neuro protective effect.  visit December 11, 2018.  No meds were changed.  He was satisfied with the meds currently prescribed.  seen March 4,, 2021 . No med changes except he was granted some flexibility around dosing of Ritalin.. Just back from [place] visiting kids. Went well.    seen April 16, 2019.  No meds were changed.  As of May 07, 2019 he reports the following: Xanax only used 1-2 times/month. 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  Driven me crazy a little.  This is a trigger for OCD.  Xanax helped calm anxiety and help him to sleep.  Manageable OCD otherwise at the lower dose of Lexapro.  Still issues with light switches.  After longer period with less Lexapro he's had a noticed a little more obsessing but managed.  A little worsening OCD about the light switches.  But it is manageable.  Still worry over Covid but does not exacerbate OCD.   Risperidone is infrequent. Eric DandyMary says he's doing a little better with chore completion.  GS 69 yo coming to visit end of May and will play with train set.  OCD at baseline with light switches 5-10 minutes.  Had a relapse since here but it was brief.    RLS managed ok unless stays up too late.  Caffeine varies from none to 5 cups.  Infrequent Xanax.  Exercise about 3 times weekly with trainer for 30 mins-45 mins. Wife says he has fragmented sleep.  Dr. [name] says CPAP data looks pretty good.   Disc Ritalin and he thinks it's helpful for energy without SE. he feels he is a little more productive on Ritalin.  Legs are jumping. No worsening anxiety.  Still some general malaise.   Taking Ritalin 30 mg just once daily bc gets up late. Primary benefit is energy.  Still CO fatigue.  Does not take it daily.    Average 8.   Can find things he enjoys.  But not a lot of things.  Interest and enjoyment is reduced.  Sexual function is OK if he waits long enough between attempts.  Also disc effects of age and testosterone.  Disc risk of testosterone. Plan: Disc Ozempic for weight loss with PCP  05/29/2019 appt, the following noted: Increased ropinirole to 3 mg bc felt it worked better.  Rare Xanax and risperidone.   Making progress and getting things done. OCD does interfere bc doesn't want to throw things away.  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   Setting up train set for GS.   Going to bed earlier and getting  Up earlier.  Taking least necessary Ritalin so just in the AM. Depression at baseline.   Stamina is not good. Wonders about tiredness.  Stumbling too much.  Stairs are a problem but manages.   [name] in [place] state.  Attends [employer].   Plan without med changes.  07/17/19 appt with the following noted: Still checking light switches and perseverating on things and wife notieces. Lexapro 10 still causes some sexual SE and will occ skip it for sexual function. Asks about  reduction. Still depressed but not overly so. Sleep 8-10 hours. Doesn't want to increase Lexapro. Questions about lithium and Ozempic.  Concerns about lithium and blood level. Occ Xanax and rare Risperidone.  Ran out of Requip and kicked all night and stopped back on it.   Tolerating meds except Crestor. Asked questions about ropinirole dosing and effectiveness. Concerns about lethargy Usually taking Ritalin just once daily. No med changes.  08/10/19 appt with the following noted: Overall about the same and no worse.  Residual OCD unchanged.  Esp checks light switches.   Working on going to sleep earlier and up earlier bc wife says he has better energy in that situation than if stays up later. Disc weight loss concerns. Sleep unchanged. CPAP doc soon.  Disc brain and health concerns.   Depression, anxiety unchanged markedly.  A little more anxious in the PM. Taking Ritalin about half the time.  Doesn't think he withdraws. Coffee varies 1 cup to 4-5 daily.  Tolerates it. Disc questions about generics of Wellbutrin. Plan no med changes  09/17/19 appt with the following noted: Still taking meds the same with Ritalin taking 30 -60 mg daily. Feels a little more anxious  Compulsive light switching only taking 5 mins and not causing a lot of distress. 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  Should be a shared position.  Historically this kind of thing would trigger OCD but he recognizes it.  Will approach it also as a means of behaviour therapy for OCD.  Already been involved in the church.  B schizophrenic SUI. After M's death. PCP Kendra Opitz at [facility] at 69 years old.  Prior psychiatric medication trials include Lexapro, citalopram NR, clomipramine weight gain, paroxetine, fluoxetine, Luvox, Trintellix,   bupropion, Abilify 10 fatigue, Cerefolin NAC, and   pramipexole,  ropinirole remotely took Adderall, Ritalin 30,  modafinil and Nuvigil, Increase Lexapro back to 20 mg January 2020.  Review of Systems:  Review of Systems  Constitutional: Positive for fatigue. Negative for fever.  Respiratory: Negative for chest tightness and shortness of breath.   Cardiovascular: Negative for chest pain and palpitations.  Musculoskeletal: Positive for arthralgias and myalgias.  Neurological: Negative for dizziness, tremors, weakness and headaches.  Psychiatric/Behavioral: Positive for depression and dysphoric mood. Negative for agitation, behavioral problems, confusion, decreased concentration, hallucinations, self-injury, sleep disturbance and suicidal ideas. The patient is nervous/anxious. The patient is not hyperactive.     Medications: I have reviewed the patient's current medications.  Current Outpatient Medications  Medication Sig Dispense Refill  . ALPRAZolam (XANAX) 0.25 MG tablet Take 1 tablet (0.25 mg total) by mouth 2 (two) times daily as needed for anxiety or sleep. 30 tablet 1  . aspirin 81 MG tablet Take 81 mg by mouth daily.    Marland Kitchen buPROPion (WELLBUTRIN XL) 150  MG 24 hr tablet Take 3 tablets (450 mg total) by mouth daily. 270 tablet 1  . Cholecalciferol (VITAMIN D-3) 5000 units TABS Take 5,000 Units by mouth daily.     . Coenzyme Q10 200 MG TABS Take 300 mg by mouth daily.     . Cyanocobalamin (VITAMIN B 12) 500 MCG TABS Take 1,000 mcg by mouth.     . desonide (DESOWEN) 0.05 % ointment   0  . econazole nitrate 1 % cream Apply topically daily.    Marland Kitchen escitalopram (LEXAPRO) 20 MG tablet TAKE 1 TABLET BY MOUTH EVERY DAY (Patient taking differently: 10 mg. ) 90 tablet 0  . Ezetimibe-Simvastatin (VYTORIN PO) Take by mouth.    . lactase (LACTAID) 3000 units tablet Take 3,000 Units by mouth as needed.     . lithium carbonate 150 MG capsule Take 1 capsule (150 mg total) by mouth at bedtime. 90 capsule 1  . methylphenidate (RITALIN) 10 MG tablet 3 tablet in the morning and 3 at noon 180 tablet 0  . [START ON  10/15/2019] methylphenidate (RITALIN) 10 MG tablet Take 3 tablets (30 mg total) by mouth 2 (two) times daily. 180 tablet 0  . [START ON 11/12/2019] methylphenidate (RITALIN) 10 MG tablet Take 3 tablets (30 mg total) by mouth 2 (two) times daily. 180 tablet 0  . mupirocin ointment (BACTROBAN) 2 %   1  . risperiDONE (RISPERDAL) 0.25 MG tablet Take 0.25 mg by mouth daily as needed (tends to brighten mood when needed).     Marland Kitchen rOPINIRole (REQUIP) 1 MG tablet TAKE 3 TABLETS (3 MG TOTAL) BY MOUTH AT BEDTIME. (Patient taking differently: Take 3 mg by mouth at bedtime. ) 270 tablet 0  . simvastatin (ZOCOR) 20 MG tablet Take 20 mg by mouth at bedtime.     No current facility-administered medications for this visit.    Medication Side Effects: None sexual SE are better not  All gone.  Allergies:  Allergies  Allergen Reactions  . Erythromycin Hives    Past Medical History:  Diagnosis Date  . Allergy   . Anemia    iron  . Carotid artery occlusion   . Hyperlipidemia   . Obesity   . OCD (obsessive compulsive disorder)   . Periodic limb movement disorder   . Sleep apnea     Family History  Problem Relation Age of Onset  . Cancer Mother        breast and ovarian  . Depression Father        bi-polar  . Depression Son     Social History   Socioeconomic History  . Marital status: Married    Spouse name: Not on file  . Number of children: Not on file  . Years of education: Not on file  . Highest education level: Not on file  Occupational History  . Not on file  Tobacco Use  . Smoking status: Never Smoker  . Smokeless tobacco: Never Used  Substance and Sexual Activity  . Alcohol use: No  . Drug use: No  . Sexual activity: Not on file  Other Topics Concern  . Not on file  Social History Narrative  . Not on file   Social Determinants of Health   Financial Resource Strain:   . Difficulty of Paying Living Expenses: Not on file  Food Insecurity:   . Worried About Brewing technologist in the Last Year: Not on file  . Ran Out of Food in the Last Year:  Not on file  Transportation Needs:   . Lack of Transportation (Medical): Not on file  . Lack of Transportation (Non-Medical): Not on file  Physical Activity:   . Days of Exercise per Week: Not on file  . Minutes of Exercise per Session: Not on file  Stress:   . Feeling of Stress : Not on file  Social Connections:   . Frequency of Communication with Friends and Family: Not on file  . Frequency of Social Gatherings with Friends and Family: Not on file  . Attends Religious Services: Not on file  . Active Member of Clubs or Organizations: Not on file  . Attends Banker Meetings: Not on file  . Marital Status: Not on file  Intimate Partner Violence:   . Fear of Current or Ex-Partner: Not on file  . Emotionally Abused: Not on file  . Physically Abused: Not on file  . Sexually Abused: Not on file    Past Medical History, Surgical history, Social history, and Family history were reviewed and updated as appropriate.   Please see review of systems for further details on the patient's review from today.   Objective:   Physical Exam:  There were no vitals taken for this visit.  Physical Exam Constitutional:      General: He is not in acute distress.    Appearance: He is well-developed. He is obese.  Musculoskeletal:        General: No deformity.  Neurological:     Mental Status: He is alert and oriented to person, place, and time.     Cranial Nerves: No dysarthria.     Motor: No tremor.     Coordination: Coordination normal.  Psychiatric:        Attention and Perception: Attention and perception normal. He does not perceive auditory or visual hallucinations.        Mood and Affect: Mood is anxious and depressed. Affect is not labile, blunt, angry or inappropriate.        Speech: Speech normal. Speech is not rapid and pressured or slurred.        Behavior: Behavior normal. Behavior is cooperative.         Thought Content: Thought content normal. Thought content is not paranoid or delusional. Thought content does not include homicidal or suicidal ideation. Thought content does not include homicidal or suicidal plan.        Cognition and Memory: Cognition and memory normal.        Judgment: Judgment normal.     Comments: Obsessing but manageable. Anxiety is a little worse especially towards the evening  .  Overall residual chronic obsessions and compulsions    November 06, 2018: [place] Cog test in office within normal limits MMSE 28/30. Animal fluency 17 . (borderline) Taken as a whole, no indication to pursue neuropsychological testing.  Lab Review:  No results found for: NA, K, CL, CO2, GLUCOSE, BUN, CREATININE, CALCIUM, PROT, ALBUMIN, AST, ALT, ALKPHOS, BILITOT, GFRNONAA, GFRAA  No results found for: WBC, RBC, HGB, HCT, PLT, MCV, MCH, MCHC, RDW, LYMPHSABS, MONOABS, EOSABS, BASOSABS  No results found for: POCLITH, LITHIUM   No results found for: PHENYTOIN, PHENOBARB, VALPROATE, CBMZ  Vitamin D level acceptable at 54.5.    Echocardiogram is stable re: AVR over the last 8 years and not likely the cause of lethargy.  .res Assessment: Plan:    Haig was seen today for follow-up, depression, anxiety and adhd.  Diagnoses and all orders for this visit:  Mixed obsessional thoughts and acts -     ALPRAZolam (XANAX) 0.25 MG tablet; Take 1 tablet (0.25 mg total) by mouth 2 (two) times daily as needed for anxiety or sleep.  Major depressive disorder, recurrent episode, moderate (HCC) -     methylphenidate (RITALIN) 10 MG tablet; 3 tablet in the morning and 3 at noon -     methylphenidate (RITALIN) 10 MG tablet; Take 3 tablets (30 mg total) by mouth 2 (two) times daily. -     methylphenidate (RITALIN) 10 MG tablet; Take 3 tablets (30 mg total) by mouth 2 (two) times daily.  Restless legs syndrome (RLS)  Obstructive sleep apnea  Erectile disorder, acquired, generalized,  moderate  Low vitamin D level    Greater than 50% of 30 min face to face time with patient was spent on counseling and coordination of care. We discussed Mr. Lacko has a long history of depression and OCD which are partially controlled.  He requires frequent follow-up and his request because of significant residual depression and anxiety and concerns about polypharmacy and he wants regular therapeutic advice on how to further reduce his OCD.  He believes the depression is a consequence of the residual OCD.  He has some compulsive checking and obsessions around the house maintenance.  He wishes to avoid sexual side effects and so we are keeping the SSRI at the lowest possible dose.  He is tried all of the reasonable SSRI options with the exception possibly of sertraline but it is likely to have more sexual dysfunction than what he is taking now.  When travels then tends to have less OCD bc triggered less.    Overall he is satisfied with the benefit to side effect ratio of Lexapro 10 mg versus the 20 mg which caused more sexual side effects. His OCD is persistent with checking lites, stove, etc. but it is not heavily time-consuming and is mildly to moderately distressing. Continue Lexapro 10 daily he asks about skipping Lexapro for a week.    Overall his level of depression is mild to moderate with good general functioning.Marland Kitchen  He is able to find things that he enjoys and he does have interests but they are reduced below normal for him.Marland Kitchen  He is probably too inactive and not as motivated as he should be which is part of his reason for wanting to increase the Ritalin.  Discussed potential benefits, risks, and side effects of stimulants with patient to include increased heart rate, palpitations, insomnia, increased anxiety, increased irritability, or decreased appetite.  Instructed patient to contact office if experiencing any significant tolerability issues. Disc risk of increasing the Ritalin further  elevating BP and pulse if we increase the Ritalin.  Need to verify that it's not markedly elevated from taking the Ritalin. Doesn't think it's been consistently elevated. Disc crash risk.  He doesn't need feel it.    He feels that this is helpful for energy. Flexibility regarding dosing Ritalin in PM  And worry is better generally in the evening than it was.  Usually taking 30 mg daily. Disc pros and cons of daily vs prn and tolerance issues.  He plans to use it  Prn.  Thinks memory problem relate to OCD bc often counting in the in the background at rest which tends to reduce his attention.  Ritalin is being successfully used off label to augment antidepressants for depression and have resulted in improved productivity and attention.  Previous screening of memory was not suggestive of any  neuro degenerative process.  Encourage walking and disc dieting for wieght loss.  Disc need for weight loss.  Option sildenafil but he doesn't want to do it.  No problem with the switch back to ropinirole. RLS managed.  Still no complaints. Disc also dx PLMS.    Option Rexulti.   No other med change  Follow-up 4 weeks  Meredith Staggers MD, DFAPA.  Please see After Visit Summary for patient specific instructions.  Future Appointments  Date Time Provider Department Center  10/15/2019  1:30 PM Cottle, Steva Ready., MD [facility] None  11/12/2019  1:30 PM Cottle, Steva Ready., MD [facility] None  12/10/2019  1:30 PM Cottle, Steva Ready., MD [facility] None  12/31/2019  1:45 PM Cottle, Steva Ready., MD [facility] None    No orders of the defined types were placed in this encounter.     -------------------------------

## 2019-09-22 ENCOUNTER — Encounter: Payer: Self-pay | Admitting: Psychiatry

## 2019-09-22 DIAGNOSIS — Z9189 Other specified personal risk factors, not elsewhere classified: Secondary | ICD-10-CM | POA: Diagnosis not present

## 2019-09-24 ENCOUNTER — Telehealth: Payer: Self-pay

## 2019-09-24 DIAGNOSIS — G4733 Obstructive sleep apnea (adult) (pediatric): Secondary | ICD-10-CM | POA: Diagnosis not present

## 2019-09-24 NOTE — Telephone Encounter (Signed)
Prior authorization submitted and approved for METHYLPHENIDATE 10 MG #180/30 DAYS, [employer] [facility] ID# [id number], PA# [id number] effective 09/22/2019-09/21/2020

## 2019-10-15 ENCOUNTER — Encounter: Payer: Self-pay | Admitting: Psychiatry

## 2019-10-15 ENCOUNTER — Other Ambulatory Visit: Payer: Self-pay

## 2019-10-15 ENCOUNTER — Ambulatory Visit (INDEPENDENT_AMBULATORY_CARE_PROVIDER_SITE_OTHER): Payer: No Typology Code available for payment source | Admitting: Psychiatry

## 2019-10-15 VITALS — BP 138/78 | HR 69

## 2019-10-15 DIAGNOSIS — G2581 Restless legs syndrome: Secondary | ICD-10-CM

## 2019-10-15 DIAGNOSIS — F422 Mixed obsessional thoughts and acts: Secondary | ICD-10-CM

## 2019-10-15 DIAGNOSIS — F331 Major depressive disorder, recurrent, moderate: Secondary | ICD-10-CM

## 2019-10-15 DIAGNOSIS — G4733 Obstructive sleep apnea (adult) (pediatric): Secondary | ICD-10-CM

## 2019-10-15 DIAGNOSIS — F5221 Male erectile disorder: Secondary | ICD-10-CM

## 2019-10-15 NOTE — Progress Notes (Signed)
Eric ConnersMichael R [name] [id number] [birth year] 69 y.o.    Subjective:   Patient ID:  Eric [name] is a 69 y.o. (DOB [birth year]) male.  Chief Complaint:  Chief Complaint  Patient presents with  . Follow-up    Medication Management  . Depression    Medication Management    Depression        Associated symptoms include fatigue and myalgias.  Associated symptoms include no decreased concentration, no headaches and no suicidal ideas.  Past medical history includes anxiety.   Medication Refill Associated symptoms include arthralgias, fatigue and myalgias. Pertinent negatives include no fever, headaches or weakness.  Anxiety Symptoms include nervous/anxious behavior. Patient reports no confusion, decreased concentration, dizziness, palpitations, shortness of breath or suicidal ideas.      Eric ConnersMichael R [name] presents for  for follow-up of OCD and depression and med changes.  visit November 27, 2018.   No improvement in energy of lithium and it was recommended that he restart lithium 150 mg daily for his neuro protective effect.  visit December 11, 2018.  No meds were changed.  He was satisfied with the meds currently prescribed.  seen March 4,, 2021 . No med changes except he was granted some flexibility around dosing of Ritalin.. Just back from [place] visiting kids. Went well.    seen April 16, 2019.  No meds were changed.  As of May 07, 2019 he reports the following: Xanax only used 1-2 times/month. 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  Driven me crazy a little.  This is a trigger for OCD.  Xanax helped calm anxiety and help him to sleep.  Manageable OCD otherwise at the lower dose of Lexapro.  Still issues with light switches.  After longer period with less Lexapro he's had a noticed a little more obsessing but managed.  A little worsening OCD about the light switches.  But it is manageable.  Still worry over Covid but does not exacerbate OCD.   Risperidone is infrequent. Eric DandyMary says he's doing a little better with chore completion.  GS 69 yo coming to visit end of May and will play with train set.  OCD at baseline with light switches 5-10 minutes.  Had a relapse since here but it was brief.    RLS managed ok unless stays up too late.  Caffeine varies from none to 5 cups.  Infrequent Xanax.  Exercise about 3 times weekly with trainer for 30 mins-45 mins. Wife says he has fragmented sleep.  Eric [name] says CPAP data looks pretty good.   Disc Ritalin and he thinks it's helpful for energy without SE. he feels he is a little more productive on Ritalin.  Legs are jumping. No worsening anxiety.  Still some general malaise.   Taking Ritalin 30 mg just once daily bc gets up late. Primary benefit is energy.  Still CO fatigue.  Does not take it daily.    Average 8.   Can find things he enjoys.  But not a lot of things.  Interest and enjoyment is reduced.  Sexual function is OK if he waits long enough between attempts.  Also disc effects of age and testosterone.  Disc risk of testosterone. Plan: Disc Ozempic for weight loss with PCP  05/29/2019 appt, the following noted: Increased ropinirole to 3 mg bc felt it worked better.  Rare Xanax and risperidone.   Making progress and getting things done. OCD does interfere bc doesn't want to throw things away.  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   Setting up train set for GS.   Going to bed earlier and getting  Up earlier.  Taking least necessary Ritalin so just in the AM. Depression at baseline.   Stamina is not good. Wonders about tiredness.  Stumbling too much.  Stairs are a problem but manages.   [name] in [place] state.  Attends [employer].   Plan without med changes.  07/17/19 appt with the following noted: Still checking light switches and perseverating on things and wife notieces. Lexapro 10 still causes some sexual SE and will occ skip it for sexual function. Asks about  reduction. Still depressed but not overly so. Sleep 8-10 hours. Doesn't want to increase Lexapro. Questions about lithium and Ozempic.  Concerns about lithium and blood level. Occ Xanax and rare Risperidone.  Ran out of Requip and kicked all night and stopped back on it.   Tolerating meds except Crestor. Asked questions about ropinirole dosing and effectiveness. Concerns about lethargy Usually taking Ritalin just once daily. No med changes.  08/10/19 appt with the following noted: Overall about the same and no worse.  Residual OCD unchanged.  Esp checks light switches.   Working on going to sleep earlier and up earlier bc wife says he has better energy in that situation than if stays up later. Disc weight loss concerns. Sleep unchanged. CPAP doc soon.  Disc brain and health concerns.   Depression, anxiety unchanged markedly.  A little more anxious in the PM. Taking Ritalin about half the time.  Doesn't think he withdraws. Coffee varies 1 cup to 4-5 daily.  Tolerates it. Disc questions about generics of Wellbutrin. Plan no med changes  09/17/19 appt with the following noted: Still taking meds the same with Ritalin taking 30 -60 mg daily. Feels a little more anxious  Compulsive light switching only taking 5 mins and not causing a lot of distress. 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  Should be a shared position.  Historically this kind of thing would trigger OCD but he recognizes it.  Will approach it also as a means of behaviour therapy for OCD.  Already been involved in the church.   10/15/19 appt with the following needed: Cont with meds.  Same dose of Ritalin as noted above. Asks about increasing Ritalin to 40 mg AM. More active physically and trying to prolong activity in afternoon so using afternoon Ritalin is using.   Holding his own.  Getting to bed more on time.  No complaints from wife. Chronic obsessiveness with a disconnect from  rationality but not a lot of time nor anxiety involved. Not chairing committees as planned.  Wife supports this decision. Has interests and activity.  Doing some exercise with trainer to keep him going. Not eligible.   No concerns with meds.  B schizophrenic SUI. After M's death. [facility] at 69 [facility] old.  Prior psychiatric medication trials include Lexapro, citalopram NR, clomipramine weight gain, paroxetine, fluoxetine, Luvox, Trintellix,   bupropion, Abilify 10 fatigue, Cerefolin NAC, and   pramipexole,  ropinirole remotely took Adderall, Ritalin 30, modafinil and Nuvigil, Increase Lexapro back to 20 mg January 2020.  Review of Systems:  Review of Systems  Constitutional: Positive for fatigue. Negative for fever.  Respiratory: Negative for chest tightness and shortness of breath.   Cardiovascular: Negative for palpitations.  Musculoskeletal: Positive for arthralgias and myalgias.  Neurological: Negative for dizziness,  tremors, weakness and headaches.  Psychiatric/Behavioral: Positive for depression and dysphoric mood. Negative for agitation, behavioral problems, confusion, decreased concentration, hallucinations, self-injury, sleep disturbance and suicidal ideas. The patient is nervous/anxious. The patient is not hyperactive.     Medications: I have reviewed the patient's current medications.  Current Outpatient Medications  Medication Sig Dispense Refill  . ALPRAZolam (XANAX) 0.25 MG tablet Take 1 tablet (0.25 mg total) by mouth 2 (two) times daily as needed for anxiety or sleep. 30 tablet 1  . aspirin 81 MG tablet Take 81 mg by mouth daily.    Marland Kitchen buPROPion (WELLBUTRIN XL) 150 MG 24 hr tablet Take 3 tablets (450 mg total) by mouth daily. 270 tablet 1  . Cholecalciferol (VITAMIN D-3) 5000 units TABS Take 5,000 Units by mouth daily.     . Coenzyme Q10 200 MG TABS Take 300 mg by mouth daily.     Marland Kitchen desonide (DESOWEN) 0.05 % ointment    0  . econazole nitrate 1 % cream Apply topically daily.    Marland Kitchen escitalopram (LEXAPRO) 20 MG tablet TAKE 1 TABLET BY MOUTH EVERY DAY (Patient taking differently: 10 mg. ) 90 tablet 0  . lactase (LACTAID) 3000 units tablet Take 3,000 Units by mouth as needed.     . methylphenidate (RITALIN) 10 MG tablet 3 tablet in the morning and 3 at noon 180 tablet 0  . methylphenidate (RITALIN) 10 MG tablet Take 3 tablets (30 mg total) by mouth 2 (two) times daily. 180 tablet 0  . [START ON 11/12/2019] methylphenidate (RITALIN) 10 MG tablet Take 3 tablets (30 mg total) by mouth 2 (two) times daily. 180 tablet 0  . mupirocin ointment (BACTROBAN) 2 %   1  . risperiDONE (RISPERDAL) 0.25 MG tablet Take 0.25 mg by mouth daily as needed (tends to brighten mood when needed).     Marland Kitchen rOPINIRole (REQUIP) 1 MG tablet TAKE 3 TABLETS (3 MG TOTAL) BY MOUTH AT BEDTIME. (Patient taking differently: Take 3 mg by mouth at bedtime. ) 270 tablet 0  . simvastatin (ZOCOR) 20 MG tablet Take 20 mg by mouth at bedtime.    Marland Kitchen lithium carbonate 150 MG capsule Take 1 capsule (150 mg total) by mouth at bedtime. (Patient not taking: Reported on 10/15/2019) 90 capsule 1   No current facility-administered medications for this visit.    Medication Side Effects: None sexual SE are better not  All gone.  Allergies:  Allergies  Allergen Reactions  . Erythromycin Hives    Past Medical History:  Diagnosis Date  . Allergy   . Anemia    iron  . Carotid artery occlusion   . Hyperlipidemia   . Obesity   . OCD (obsessive compulsive disorder)   . Periodic limb movement disorder   . Sleep apnea     Family History  Problem Relation Age of Onset  . Cancer Mother        breast and ovarian  . Depression Father        bi-polar  . Depression Son     Social History   Socioeconomic History  . Marital status: Married    Spouse name: Not on file  . Number of children: Not on file  . Years of education: Not on file  . Highest education  level: Not on file  Occupational History  . Not on file  Tobacco Use  . Smoking status: Never Smoker  . Smokeless tobacco: Never Used  Substance and Sexual Activity  . Alcohol use: No  .  Drug use: No  . Sexual activity: Not on file  Other Topics Concern  . Not on file  Social History Narrative  . Not on file   Social Determinants of Health   Financial Resource Strain:   . Difficulty of Paying Living Expenses: Not on file  Food Insecurity:   . Worried About Programme researcher, broadcasting/film/video in the Last Year: Not on file  . Ran Out of Food in the Last Year: Not on file  Transportation Needs:   . Lack of Transportation (Medical): Not on file  . Lack of Transportation (Non-Medical): Not on file  Physical Activity:   . Days of Exercise per Week: Not on file  . Minutes of Exercise per Session: Not on file  Stress:   . Feeling of Stress : Not on file  Social Connections:   . Frequency of Communication with Friends and Family: Not on file  . Frequency of Social Gatherings with Friends and Family: Not on file  . Attends Religious Services: Not on file  . Active Member of Clubs or Organizations: Not on file  . Attends Banker Meetings: Not on file  . Marital Status: Not on file  Intimate Partner Violence:   . Fear of Current or Ex-Partner: Not on file  . Emotionally Abused: Not on file  . Physically Abused: Not on file  . Sexually Abused: Not on file    Past Medical History, Surgical history, Social history, and Family history were reviewed and updated as appropriate.   Please see review of systems for further details on the patient's review from today.   Objective:   Physical Exam:  BP 138/78   Pulse 67   Physical Exam Constitutional:      General: He is not in acute distress.    Appearance: He is well-developed. He is obese.  Musculoskeletal:        General: No deformity.  Neurological:     Mental Status: He is alert and oriented to person, place, and time.      Cranial Nerves: No dysarthria.     Motor: No tremor.     Coordination: Coordination normal.  Psychiatric:        Attention and Perception: Attention and perception normal. He does not perceive auditory or visual hallucinations.        Mood and Affect: Mood is anxious and depressed. Affect is not labile, blunt, angry, tearful or inappropriate.        Speech: Speech normal. Speech is not rapid and pressured or slurred.        Behavior: Behavior normal. Behavior is cooperative.        Thought Content: Thought content normal. Thought content is not paranoid or delusional. Thought content does not include homicidal or suicidal ideation. Thought content does not include homicidal or suicidal plan.        Cognition and Memory: Cognition and memory normal.        Judgment: Judgment normal.     Comments: Obsessing but manageable. Anxiety is a little worse especially towards the evening  .  Overall residual chronic obsessions and compulsions    November 06, 2018: [place] Cog test in office within normal limits MMSE 28/30. Animal fluency 17 . (borderline) Taken as a whole, no indication to pursue neuropsychological testing.  Lab Review:  No results found for: NA, K, CL, CO2, GLUCOSE, BUN, CREATININE, CALCIUM, PROT, ALBUMIN, AST, ALT, ALKPHOS, BILITOT, GFRNONAA, GFRAA  No results found for: WBC, RBC, HGB, HCT,  PLT, MCV, MCH, MCHC, RDW, LYMPHSABS, MONOABS, EOSABS, BASOSABS  No results found for: POCLITH, LITHIUM   No results found for: PHENYTOIN, PHENOBARB, VALPROATE, CBMZ  Vitamin D level acceptable at 54.5.    Echocardiogram is stable re: AVR over the last 8 years and not likely the cause of lethargy.  .res Assessment: Plan:    Tyrik was seen today for follow-up and depression.  Diagnoses and all orders for this visit:  Major depressive disorder, recurrent episode, moderate (HCC)  Mixed obsessional thoughts and acts  Restless legs syndrome (RLS)  Obstructive sleep  apnea  Erectile disorder, acquired, generalized, moderate    Greater than 50% of 30 min face to face time with patient was spent on counseling and coordination of care. We discussed Mr. Sweetser has a long history of depression and OCD which are partially controlled.  He requires frequent follow-up and his request because of significant residual depression and anxiety and concerns about polypharmacy and he wants regular therapeutic advice on how to further reduce his OCD.  He believes the depression is a consequence of the residual OCD.  He has some compulsive checking and obsessions around the house maintenance.  He wishes to avoid sexual side effects and so we are keeping the SSRI at the lowest possible dose.  He is tried all of the reasonable SSRI options with the exception possibly of sertraline but it is likely to have more sexual dysfunction than what he is taking now.  When travels then tends to have less OCD bc triggered less.    Overall he is satisfied with the benefit to side effect ratio of Lexapro 10 mg versus the 20 mg which caused more sexual side effects. His OCD is persistent with checking lites, stove, etc. but it is not heavily time-consuming and is mildly to moderately distressing. Continue Lexapro 10 daily he asks about skipping Lexapro for a week.    Overall his level of depression is mild to moderate with good general functioning.Marland Kitchen  He is able to find things that he enjoys and he does have interests but they are reduced below normal for him.Marland Kitchen  He is probably too inactive and not as motivated as he should be which is part of his reason for wanting to increase the Ritalin. Not   Discussed potential benefits, risks, and side effects of stimulants with patient to include increased heart rate, palpitations, insomnia, increased anxiety, increased irritability, or decreased appetite.  Instructed patient to contact office if experiencing any significant tolerability issues. Disc risk of  increasing the Ritalin further elevating BP and pulse if we increase the Ritalin.  Need to verify that it's not markedly elevated from taking the Ritalin. Doesn't think it's been consistently elevated. Disc crash risk.  He doesn't need feel it.    He feels that this is helpful for energy. Flexibility regarding dosing Ritalin in PM  And worry is better generally in the evening than it was.  Usually taking 30 mg daily. Disc pros and cons of daily vs prn and tolerance issues.  He plans to use it  Prn.  Disc B12 and vitamin D.   Thinks memory problem relate to OCD bc often counting in the in the background at rest which tends to reduce his attention.  Ritalin is being successfully used off label to augment antidepressants for depression and have resulted in improved productivity and attention.  Previous screening of memory was not suggestive of any neuro degenerative process.  Encourage walking and disc dieting for  wieght loss.  Disc need for weight loss.  Disc Ozempic and Saxenda options and differences.  Marland Kitchen  Option sildenafil but he doesn't want to do it.  No problem with the switch back to ropinirole. RLS managed.  Still no complaints. Disc also dx PLMS.    Option Rexulti.   No med change  Follow-up 4 weeks  Meredith Staggers MD, DFAPA.  Please see After Visit Summary for patient specific instructions.  Future Appointments  Date Time Provider Department Center  11/12/2019  1:30 PM Cottle, Steva Ready., MD [facility] None  12/10/2019  1:30 PM Cottle, Steva Ready., MD [facility] None  12/31/2019  1:45 PM Cottle, Steva Ready., MD [facility] None    No orders of the defined types were placed in this encounter.     -------------------------------

## 2019-10-23 ENCOUNTER — Other Ambulatory Visit: Payer: Self-pay | Admitting: Psychiatry

## 2019-10-28 DIAGNOSIS — Z23 Encounter for immunization: Secondary | ICD-10-CM | POA: Diagnosis not present

## 2019-11-06 DIAGNOSIS — Z7982 Long term (current) use of aspirin: Secondary | ICD-10-CM | POA: Diagnosis not present

## 2019-11-06 DIAGNOSIS — E785 Hyperlipidemia, unspecified: Secondary | ICD-10-CM | POA: Diagnosis not present

## 2019-11-06 DIAGNOSIS — I6523 Occlusion and stenosis of bilateral carotid arteries: Secondary | ICD-10-CM | POA: Diagnosis not present

## 2019-11-06 DIAGNOSIS — I10 Essential (primary) hypertension: Secondary | ICD-10-CM | POA: Diagnosis not present

## 2019-11-12 ENCOUNTER — Ambulatory Visit (INDEPENDENT_AMBULATORY_CARE_PROVIDER_SITE_OTHER): Payer: No Typology Code available for payment source | Admitting: Psychiatry

## 2019-11-12 ENCOUNTER — Encounter: Payer: Self-pay | Admitting: Psychiatry

## 2019-11-12 ENCOUNTER — Other Ambulatory Visit: Payer: Self-pay

## 2019-11-12 DIAGNOSIS — G4733 Obstructive sleep apnea (adult) (pediatric): Secondary | ICD-10-CM | POA: Diagnosis not present

## 2019-11-12 DIAGNOSIS — F422 Mixed obsessional thoughts and acts: Secondary | ICD-10-CM

## 2019-11-12 DIAGNOSIS — R7989 Other specified abnormal findings of blood chemistry: Secondary | ICD-10-CM

## 2019-11-12 DIAGNOSIS — F331 Major depressive disorder, recurrent, moderate: Secondary | ICD-10-CM | POA: Diagnosis not present

## 2019-11-12 DIAGNOSIS — F5221 Male erectile disorder: Secondary | ICD-10-CM

## 2019-11-12 DIAGNOSIS — G2581 Restless legs syndrome: Secondary | ICD-10-CM

## 2019-11-12 DIAGNOSIS — N529 Male erectile dysfunction, unspecified: Secondary | ICD-10-CM

## 2019-11-12 MED ORDER — ESCITALOPRAM OXALATE 20 MG PO TABS: 20.0000 mg | ORAL_TABLET | Freq: Every day | ORAL | 0 refills | Status: DC

## 2019-11-12 NOTE — Progress Notes (Signed)
Eric [name] [id number] 05-23-1950 69 y.o.    Subjective:   Patient ID:  Eric [name] is a 69 y.o. (DOB 05-23-1950) male.  Chief Complaint:  Chief Complaint  Patient presents with  . Follow-up    Medication Management  . Depression    Medication Management    Depression        Associated symptoms include fatigue and myalgias.  Associated symptoms include no decreased concentration, no headaches and no suicidal ideas.  Past medical history includes anxiety.   Medication Refill Associated symptoms include arthralgias, fatigue and myalgias. Pertinent negatives include no fever, headaches or weakness.  Anxiety Symptoms include nervous/anxious behavior. Patient reports no confusion, decreased concentration, dizziness, palpitations, shortness of breath or suicidal ideas.      Eric [name] presents for  for follow-up of OCD and depression and med changes.  visit November 27, 2018.   No improvement in energy of lithium and it was recommended that he restart lithium 150 mg daily for his neuro protective effect.  visit December 11, 2018.  No meds were changed.  He was satisfied with the meds currently prescribed.  seen March 4,, 2021 . No med changes except he was granted some flexibility around dosing of Ritalin.. Just back from [place] visiting kids. Went well.    seen April 16, 2019.  No meds were changed.  As of May 07, 2019 he reports the following: Xanax only used 1-2 times/month. 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  Driven me crazy a little.  This is a trigger for OCD.  Xanax helped calm anxiety and help him to sleep.  Manageable OCD otherwise at the lower dose of Lexapro.  Still issues with light switches.  After longer period with less Lexapro he's had a noticed a little more obsessing but managed.  A little worsening OCD about the light switches.  But it is manageable.  Still worry over Covid but does not exacerbate OCD.   Risperidone is infrequent. [name] says he's doing a little better with chore completion.  GS 69 yo coming to visit end of May and will play with train set.  OCD at baseline with light switches 5-10 minutes.  Had a relapse since here but it was brief.    RLS managed ok unless stays up too late.  Caffeine varies from none to 5 cups.  Infrequent Xanax.  Exercise about 3 times weekly with trainer for 30 mins-45 mins. Wife says he has fragmented sleep.  Dr. [name] says CPAP data looks pretty good.   Disc Ritalin and he thinks it's helpful for energy without SE. he feels he is a little more productive on Ritalin.  Legs are jumping. No worsening anxiety.  Still some general malaise.   Taking Ritalin 30 mg just once daily bc gets up late. Primary benefit is energy.  Still CO fatigue.  Does not take it daily.    Average 8.   Can find things he enjoys.  But not a lot of things.  Interest and enjoyment is reduced.  Sexual function is OK if he waits long enough between attempts.  Also disc effects of age and testosterone.  Disc risk of testosterone. Plan: Disc Ozempic for weight loss with PCP  05/29/2019 appt, the following noted: Increased ropinirole to 3 mg bc felt it worked better.  Rare Xanax and risperidone.   Making progress and getting things done. OCD does interfere bc doesn't want to throw things away.  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   Setting up train set for GS.   Going to bed earlier and getting  Up earlier.  Taking least necessary Ritalin so just in the AM. Depression at baseline.   Stamina is not good. Wonders about tiredness.  Stumbling too much.  Stairs are a problem but manages.   [name] in [place] state.  Attends [employer].   Plan without med changes.  07/17/19 appt with the following noted: Still checking light switches and perseverating on things and wife notieces. Lexapro 10 still causes some sexual SE and will occ skip it for sexual function. Asks about  reduction. Still depressed but not overly so. Sleep 8-10 hours. Doesn't want to increase Lexapro. Questions about lithium and Ozempic.  Concerns about lithium and blood level. Occ Xanax and rare Risperidone.  Ran out of Requip and kicked all night and stopped back on it.   Tolerating meds except Crestor. Asked questions about ropinirole dosing and effectiveness. Concerns about lethargy Usually taking Ritalin just once daily. No med changes.  08/10/19 appt with the following noted: Overall about the same and no worse.  Residual OCD unchanged.  Esp checks light switches.   Working on going to sleep earlier and up earlier bc wife says he has better energy in that situation than if stays up later. Disc weight loss concerns. Sleep unchanged. CPAP doc soon.  Disc brain and health concerns.   Depression, anxiety unchanged markedly.  A little more anxious in the PM. Taking Ritalin about half the time.  Doesn't think he withdraws. Coffee varies 1 cup to 4-5 daily.  Tolerates it. Disc questions about generics of Wellbutrin. Plan no med changes  09/17/19 appt with the following noted: Still taking meds the same with Ritalin taking 30 -60 mg daily. Feels a little more anxious  Compulsive light switching only taking 5 mins and not causing a lot of distress. 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  Should be a shared position.  Historically this kind of thing would trigger OCD but he recognizes it.  Will approach it also as a means of behaviour therapy for OCD.  Already been involved in the church.   10/15/19 appt with the following needed: Cont with meds.  Same dose of Ritalin as noted above. Asks about increasing Ritalin to 40 mg AM. More active physically and trying to prolong activity in afternoon so using afternoon Ritalin is using.   Holding his own.  Getting to bed more on time.  No complaints from wife. Chronic obsessiveness with a disconnect from  rationality but not a lot of time nor anxiety involved. Not chairing committees as planned.  Wife supports this decision. Has interests and activity.  Doing some exercise with trainer to keep him going. Not eligible.   No concerns with meds. And No med changes made.  11/12/2019 appointment with the following noted: Running myself ragged helping this Afghani family.  Man was shot defending the [place].  Answered questions about getting help for the man. He has helped raise money at [employer] for him. Has not added to his OCD and he thinks bc he's not responsible for fixing it just transportation and communication.  He's not the overall leader but heavily involved. Mostly only ritalin in the morning.  Not generally napping afternoon.  Mood improved.  Answered questions about CBD for pain.    B schizophrenic SUI. After M's death. PCP Kendra Opitz at [facility] at 46 years old.  Prior psychiatric medication trials include Lexapro, citalopram NR, clomipramine weight gain, paroxetine, fluoxetine, Luvox, Trintellix,   bupropion, Abilify 10 fatigue, Cerefolin NAC, and   pramipexole,  ropinirole remotely took Adderall, Ritalin 30, modafinil and Nuvigil, Increase Lexapro back to 20 mg January 2020.  Review of Systems:  Review of Systems  Constitutional: Positive for fatigue. Negative for fever.  Respiratory: Negative for chest tightness and shortness of breath.   Cardiovascular: Negative for palpitations.  Musculoskeletal: Positive for arthralgias and myalgias.  Neurological: Negative for dizziness, tremors, weakness and headaches.  Psychiatric/Behavioral: Positive for depression and dysphoric mood. Negative for agitation, behavioral problems, confusion, decreased concentration, hallucinations, self-injury, sleep disturbance and suicidal ideas. The patient is nervous/anxious. The patient is not hyperactive.     Medications: I have reviewed the patient's current  medications.  Current Outpatient Medications  Medication Sig Dispense Refill  . ALPRAZolam (XANAX) 0.25 MG tablet Take 1 tablet (0.25 mg total) by mouth 2 (two) times daily as needed for anxiety or sleep. 30 tablet 1  . aspirin 81 MG tablet Take 81 mg by mouth daily.    Marland Kitchen buPROPion (WELLBUTRIN XL) 150 MG 24 hr tablet Take 3 tablets (450 mg total) by mouth daily. 270 tablet 1  . Cholecalciferol (VITAMIN D-3) 5000 units TABS Take 5,000 Units by mouth daily.     . Coenzyme Q10 200 MG TABS Take 300 mg by mouth daily.     Marland Kitchen desonide (DESOWEN) 0.05 % ointment   0  . econazole nitrate 1 % cream Apply topically daily.    Marland Kitchen escitalopram (LEXAPRO) 20 MG tablet Take 1 tablet (20 mg total) by mouth daily. 90 tablet 0  . lactase (LACTAID) 3000 units tablet Take 3,000 Units by mouth as needed.     . methylphenidate (RITALIN) 10 MG tablet 3 tablet in the morning and 3 at noon 180 tablet 0  . methylphenidate (RITALIN) 10 MG tablet Take 3 tablets (30 mg total) by mouth 2 (two) times daily. 180 tablet 0  . methylphenidate (RITALIN) 10 MG tablet Take 3 tablets (30 mg total) by mouth 2 (two) times daily. 180 tablet 0  . mupirocin ointment (BACTROBAN) 2 %   1  . risperiDONE (RISPERDAL) 0.25 MG tablet Take 0.25 mg by mouth daily as needed (tends to brighten mood when needed).     Marland Kitchen rOPINIRole (REQUIP) 1 MG tablet TAKE 3 TABLETS (3 MG TOTAL) BY MOUTH AT BEDTIME. 270 tablet 0  . simvastatin (ZOCOR) 20 MG tablet Take 20 mg by mouth at bedtime.     No current facility-administered medications for this visit.    Medication Side Effects: None sexual SE are better not  All gone.  Allergies:  Allergies  Allergen Reactions  . Erythromycin Hives    Past Medical History:  Diagnosis Date  . Allergy   . Anemia    iron  . Carotid artery occlusion   . Hyperlipidemia   . Obesity   . OCD (obsessive compulsive disorder)   . Periodic limb movement disorder   . Sleep apnea     Family History  Problem Relation  Age of Onset  . Cancer Mother        breast and ovarian  . Depression Father        bi-polar  . Depression Son     Social History   Socioeconomic History  . Marital status: Married    Spouse name: Not on file  . Number of  children: Not on file  . Years of education: Not on file  . Highest education level: Not on file  Occupational History  . Not on file  Tobacco Use  . Smoking status: Never Smoker  . Smokeless tobacco: Never Used  Substance and Sexual Activity  . Alcohol use: No  . Drug use: No  . Sexual activity: Not on file  Other Topics Concern  . Not on file  Social History Narrative  . Not on file   Social Determinants of Health   Financial Resource Strain:   . Difficulty of Paying Living Expenses: Not on file  Food Insecurity:   . Worried About Programme researcher, broadcasting/film/video in the Last Year: Not on file  . Ran Out of Food in the Last Year: Not on file  Transportation Needs:   . Lack of Transportation (Medical): Not on file  . Lack of Transportation (Non-Medical): Not on file  Physical Activity:   . Days of Exercise per Week: Not on file  . Minutes of Exercise per Session: Not on file  Stress:   . Feeling of Stress : Not on file  Social Connections:   . Frequency of Communication with Friends and Family: Not on file  . Frequency of Social Gatherings with Friends and Family: Not on file  . Attends Religious Services: Not on file  . Active Member of Clubs or Organizations: Not on file  . Attends Banker Meetings: Not on file  . Marital Status: Not on file  Intimate Partner Violence:   . Fear of Current or Ex-Partner: Not on file  . Emotionally Abused: Not on file  . Physically Abused: Not on file  . Sexually Abused: Not on file    Past Medical History, Surgical history, Social history, and Family history were reviewed and updated as appropriate.   Please see review of systems for further details on the patient's review from today.   Objective:    Physical Exam:  There were no vitals taken for this visit.  Physical Exam Constitutional:      General: He is not in acute distress.    Appearance: He is well-developed. He is obese.  Musculoskeletal:        General: No deformity.  Neurological:     Mental Status: He is alert and oriented to person, place, and time.     Cranial Nerves: No dysarthria.     Motor: No tremor.     Coordination: Coordination normal.  Psychiatric:        Attention and Perception: Attention and perception normal. He does not perceive auditory or visual hallucinations.        Mood and Affect: Mood is anxious and depressed. Affect is not labile, blunt, angry or inappropriate.        Speech: Speech normal. Speech is not rapid and pressured or slurred.        Behavior: Behavior normal. Behavior is cooperative.        Thought Content: Thought content normal. Thought content is not paranoid or delusional. Thought content does not include homicidal or suicidal ideation. Thought content does not include homicidal or suicidal plan.        Cognition and Memory: Cognition and memory normal.        Judgment: Judgment normal.     Comments: Obsessing but manageable. Anxiety is a little worse especially towards the evening  .  Overall residual chronic obsessions and compulsions  Less depressed.    November 06, 2018:  Montreal Cog test in office within normal limits MMSE 28/30. Animal fluency 17 . (borderline) Taken as a whole, no indication to pursue neuropsychological testing.  Lab Review:  No results found for: NA, K, CL, CO2, GLUCOSE, BUN, CREATININE, CALCIUM, PROT, ALBUMIN, AST, ALT, ALKPHOS, BILITOT, GFRNONAA, GFRAA  No results found for: WBC, RBC, HGB, HCT, PLT, MCV, MCH, MCHC, RDW, LYMPHSABS, MONOABS, EOSABS, BASOSABS  No results found for: POCLITH, LITHIUM   No results found for: PHENYTOIN, PHENOBARB, VALPROATE, CBMZ  Vitamin D level acceptable at 54.5.    Echocardiogram is stable re: AVR over the last  8 years and not likely the cause of lethargy.  .res Assessment: Plan:    Eric [name] was seen today for follow-up and depression.  Diagnoses and all orders for this visit:  Major depressive disorder, recurrent episode, moderate (HCC) -     escitalopram (LEXAPRO) 20 MG tablet; Take 1 tablet (20 mg total) by mouth daily.  Mixed obsessional thoughts and acts -     escitalopram (LEXAPRO) 20 MG tablet; Take 1 tablet (20 mg total) by mouth daily.  Restless legs syndrome (RLS)  Obstructive sleep apnea  Erectile disorder, acquired, generalized, moderate  Low vitamin D level  Restless legs syndrome  Inability to maintain erection    Greater than 50% of 30 min face to face time with patient was spent on counseling and coordination of care. We discussed Eric [name] has a long history of depression and OCD which are partially controlled.  He requires frequent follow-up and his request because of significant residual depression and anxiety and concerns about polypharmacy and he wants regular therapeutic advice on how to further reduce his OCD.  He believes the depression is a consequence of the residual OCD.  He has some compulsive checking and obsessions around the house maintenance.  He wishes to avoid sexual side effects and so we are keeping the SSRI at the lowest possible dose.  He is tried all of the reasonable SSRI options with the exception possibly of sertraline but it is likely to have more sexual dysfunction than what he is taking now.  When travels then tends to have less OCD bc triggered less.    Overall he is satisfied with the benefit to side effect ratio of Lexapro 10 mg versus the 20 mg which caused more sexual side effects. His OCD is persistent with checking lites, stove, etc. but it is not heavily time-consuming and is mildly to moderately distressing. Continue Lexapro 10 daily he asks about skipping Lexapro for a week.    Overall his level of depression is mild to moderate  with good general functioning.Marland Kitchen  He is able to find things that he enjoys and he does have interests but they are reduced below normal for him.Marland Kitchen  He is probably too inactive and not as motivated as he should be which is part of his reason for wanting to increase the Ritalin. Not   Discussed potential benefits, risks, and side effects of stimulants with patient to include increased heart rate, palpitations, insomnia, increased anxiety, increased irritability, or decreased appetite.  Instructed patient to contact office if experiencing any significant tolerability issues. Disc risk of increasing the Ritalin further elevating BP and pulse if we increase the Ritalin.  Need to verify that it's not markedly elevated from taking the Ritalin. Doesn't think it's been consistently elevated. Disc crash risk.  He doesn't need feel it.    He feels that this is helpful for energy. Flexibility regarding dosing Ritalin  in PM  And worry is better generally in the evening than it was.  Usually taking 30 mg daily. Disc pros and cons of daily vs prn and tolerance issues.  He plans to use it  Prn.  Disc B12 and vitamin D.   Thinks memory problem relate to OCD bc often counting in the in the background at rest which tends to reduce his attention.  Ritalin is being successfully used off label to augment antidepressants for depression and have resulted in improved productivity and attention.  Previous screening of memory was not suggestive of any neuro degenerative process.  Encourage walking and disc dieting for wieght loss.  Disc need for weight loss.  Disc Ozempic and Saxenda options and differences.  Marland Kitchen  Option sildenafil but he doesn't want to do it.  No problem with the switch back to ropinirole. RLS managed.  Still no complaints. Disc also dx PLMS.    Option Rexulti.   No med change  Follow-up 4 weeks  Eric Staggers MD, DFAPA.  Please see After Visit Summary for patient specific instructions.  Future  Appointments  Date Time Provider Department Center  12/10/2019  1:30 PM Cottle, Steva Ready., MD [facility] None  12/31/2019  1:45 PM Cottle, Steva Ready., MD [facility] None    No orders of the defined types were placed in this encounter.     -------------------------------

## 2019-12-10 ENCOUNTER — Other Ambulatory Visit: Payer: Self-pay | Admitting: Psychiatry

## 2019-12-10 ENCOUNTER — Ambulatory Visit (INDEPENDENT_AMBULATORY_CARE_PROVIDER_SITE_OTHER): Payer: No Typology Code available for payment source | Admitting: Psychiatry

## 2019-12-10 ENCOUNTER — Encounter: Payer: Self-pay | Admitting: Psychiatry

## 2019-12-10 ENCOUNTER — Other Ambulatory Visit: Payer: Self-pay

## 2019-12-10 DIAGNOSIS — R7989 Other specified abnormal findings of blood chemistry: Secondary | ICD-10-CM

## 2019-12-10 DIAGNOSIS — G2581 Restless legs syndrome: Secondary | ICD-10-CM | POA: Diagnosis not present

## 2019-12-10 DIAGNOSIS — F331 Major depressive disorder, recurrent, moderate: Secondary | ICD-10-CM | POA: Diagnosis not present

## 2019-12-10 DIAGNOSIS — F422 Mixed obsessional thoughts and acts: Secondary | ICD-10-CM

## 2019-12-10 DIAGNOSIS — F5221 Male erectile disorder: Secondary | ICD-10-CM

## 2019-12-10 DIAGNOSIS — G4733 Obstructive sleep apnea (adult) (pediatric): Secondary | ICD-10-CM | POA: Diagnosis not present

## 2019-12-10 MED ORDER — METHYLPHENIDATE HCL 10 MG PO TABS: 30.0000 mg | ORAL_TABLET | Freq: Two times a day (BID) | ORAL | 0 refills | Status: DC

## 2019-12-10 MED ORDER — ROPINIROLE HCL 1 MG PO TABS: ORAL_TABLET | ORAL | 1 refills | Status: DC

## 2019-12-10 MED ORDER — METHYLPHENIDATE HCL 10 MG PO TABS: ORAL_TABLET | ORAL | 0 refills | Status: DC

## 2019-12-10 NOTE — Telephone Encounter (Signed)
Has apt today 
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
## 2019-12-31 ENCOUNTER — Ambulatory Visit: Payer: No Typology Code available for payment source | Admitting: Psychiatry

## 2020-01-11 ENCOUNTER — Other Ambulatory Visit: Payer: BLUE CROSS/BLUE SHIELD

## 2020-01-11 DIAGNOSIS — Z20822 Contact with and (suspected) exposure to covid-19: Secondary | ICD-10-CM

## 2020-01-14 LAB — NOVEL CORONAVIRUS, NAA: SARS-CoV-2, NAA: NOT DETECTED

## 2020-01-21 ENCOUNTER — Encounter: Payer: Self-pay | Admitting: Psychiatry

## 2020-01-21 ENCOUNTER — Telehealth (INDEPENDENT_AMBULATORY_CARE_PROVIDER_SITE_OTHER): Payer: BLUE CROSS/BLUE SHIELD | Admitting: Psychiatry

## 2020-01-21 DIAGNOSIS — G2581 Restless legs syndrome: Secondary | ICD-10-CM

## 2020-01-21 DIAGNOSIS — F422 Mixed obsessional thoughts and acts: Secondary | ICD-10-CM

## 2020-01-21 DIAGNOSIS — F5221 Male erectile disorder: Secondary | ICD-10-CM

## 2020-01-21 DIAGNOSIS — G4733 Obstructive sleep apnea (adult) (pediatric): Secondary | ICD-10-CM | POA: Diagnosis not present

## 2020-01-21 DIAGNOSIS — F331 Major depressive disorder, recurrent, moderate: Secondary | ICD-10-CM | POA: Diagnosis not present

## 2020-01-21 DIAGNOSIS — R7989 Other specified abnormal findings of blood chemistry: Secondary | ICD-10-CM

## 2020-01-21 MED ORDER — ALPRAZOLAM 0.25 MG PO TABS: 0.2500 mg | ORAL_TABLET | Freq: Two times a day (BID) | ORAL | 1 refills | Status: DC | PRN
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
## 2020-02-10 DIAGNOSIS — Z0000 Encounter for general adult medical examination without abnormal findings: Secondary | ICD-10-CM | POA: Diagnosis not present

## 2020-02-10 DIAGNOSIS — Z79899 Other long term (current) drug therapy: Secondary | ICD-10-CM | POA: Diagnosis not present

## 2020-02-10 DIAGNOSIS — E7800 Pure hypercholesterolemia, unspecified: Secondary | ICD-10-CM | POA: Diagnosis not present

## 2020-02-10 DIAGNOSIS — E559 Vitamin D deficiency, unspecified: Secondary | ICD-10-CM | POA: Diagnosis not present

## 2020-02-10 DIAGNOSIS — Z125 Encounter for screening for malignant neoplasm of prostate: Secondary | ICD-10-CM | POA: Diagnosis not present

## 2020-02-10 DIAGNOSIS — R7303 Prediabetes: Secondary | ICD-10-CM | POA: Diagnosis not present

## 2020-02-15 ENCOUNTER — Other Ambulatory Visit: Payer: Self-pay | Admitting: Psychiatry

## 2020-02-15 DIAGNOSIS — F331 Major depressive disorder, recurrent, moderate: Secondary | ICD-10-CM

## 2020-02-15 DIAGNOSIS — F422 Mixed obsessional thoughts and acts: Secondary | ICD-10-CM

## 2020-02-15 NOTE — Telephone Encounter (Signed)
Send in 10 mg or 20 mg?

## 2020-02-18 ENCOUNTER — Ambulatory Visit (INDEPENDENT_AMBULATORY_CARE_PROVIDER_SITE_OTHER): Payer: BLUE CROSS/BLUE SHIELD | Admitting: Psychiatry

## 2020-02-18 ENCOUNTER — Other Ambulatory Visit: Payer: Self-pay

## 2020-02-18 ENCOUNTER — Encounter: Payer: Self-pay | Admitting: Psychiatry

## 2020-02-18 DIAGNOSIS — G4733 Obstructive sleep apnea (adult) (pediatric): Secondary | ICD-10-CM

## 2020-02-18 DIAGNOSIS — G2581 Restless legs syndrome: Secondary | ICD-10-CM | POA: Diagnosis not present

## 2020-02-18 DIAGNOSIS — F331 Major depressive disorder, recurrent, moderate: Secondary | ICD-10-CM

## 2020-02-18 DIAGNOSIS — R7989 Other specified abnormal findings of blood chemistry: Secondary | ICD-10-CM

## 2020-02-18 DIAGNOSIS — F422 Mixed obsessional thoughts and acts: Secondary | ICD-10-CM

## 2020-02-18 DIAGNOSIS — F5221 Male erectile disorder: Secondary | ICD-10-CM
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
## 2020-03-01 ENCOUNTER — Ambulatory Visit (INDEPENDENT_AMBULATORY_CARE_PROVIDER_SITE_OTHER): Payer: Self-pay | Admitting: Family Medicine

## 2020-03-01 DIAGNOSIS — Z0289 Encounter for other administrative examinations: Secondary | ICD-10-CM

## 2020-03-09 DIAGNOSIS — G4733 Obstructive sleep apnea (adult) (pediatric): Secondary | ICD-10-CM | POA: Diagnosis not present

## 2020-03-15 ENCOUNTER — Ambulatory Visit (INDEPENDENT_AMBULATORY_CARE_PROVIDER_SITE_OTHER): Payer: BLUE CROSS/BLUE SHIELD | Admitting: Family Medicine

## 2020-03-15 ENCOUNTER — Other Ambulatory Visit: Payer: Self-pay

## 2020-03-15 ENCOUNTER — Ambulatory Visit (INDEPENDENT_AMBULATORY_CARE_PROVIDER_SITE_OTHER): Payer: Self-pay | Admitting: Family Medicine

## 2020-03-15 ENCOUNTER — Encounter (INDEPENDENT_AMBULATORY_CARE_PROVIDER_SITE_OTHER): Payer: Self-pay | Admitting: Family Medicine

## 2020-03-15 VITALS — BP 134/73 | HR 64 | Temp 97.8°F | Ht 71.0 in | Wt 285.0 lb

## 2020-03-15 DIAGNOSIS — E559 Vitamin D deficiency, unspecified: Secondary | ICD-10-CM

## 2020-03-15 DIAGNOSIS — E65 Localized adiposity: Secondary | ICD-10-CM

## 2020-03-15 DIAGNOSIS — R0602 Shortness of breath: Secondary | ICD-10-CM | POA: Diagnosis not present

## 2020-03-15 DIAGNOSIS — Z6839 Body mass index (BMI) 39.0-39.9, adult: Secondary | ICD-10-CM

## 2020-03-15 DIAGNOSIS — R7303 Prediabetes: Secondary | ICD-10-CM

## 2020-03-15 DIAGNOSIS — G4733 Obstructive sleep apnea (adult) (pediatric): Secondary | ICD-10-CM

## 2020-03-15 DIAGNOSIS — I6523 Occlusion and stenosis of bilateral carotid arteries: Secondary | ICD-10-CM

## 2020-03-15 DIAGNOSIS — E8881 Metabolic syndrome: Secondary | ICD-10-CM | POA: Diagnosis not present

## 2020-03-15 DIAGNOSIS — F428 Other obsessive-compulsive disorder: Secondary | ICD-10-CM

## 2020-03-15 DIAGNOSIS — Z9189 Other specified personal risk factors, not elsewhere classified: Secondary | ICD-10-CM | POA: Diagnosis not present

## 2020-03-15 DIAGNOSIS — E739 Lactose intolerance, unspecified: Secondary | ICD-10-CM

## 2020-03-15 DIAGNOSIS — F331 Major depressive disorder, recurrent, moderate: Secondary | ICD-10-CM | POA: Diagnosis not present

## 2020-03-15 DIAGNOSIS — E7800 Pure hypercholesterolemia, unspecified: Secondary | ICD-10-CM

## 2020-03-15 DIAGNOSIS — R5382 Chronic fatigue, unspecified: Secondary | ICD-10-CM

## 2020-03-15 DIAGNOSIS — Z9989 Dependence on other enabling machines and devices: Secondary | ICD-10-CM

## 2020-03-15 DIAGNOSIS — R5383 Other fatigue: Secondary | ICD-10-CM

## 2020-03-15 DIAGNOSIS — G2581 Restless legs syndrome: Secondary | ICD-10-CM

## 2020-03-15 DIAGNOSIS — G8929 Other chronic pain: Secondary | ICD-10-CM

## 2020-03-15 DIAGNOSIS — E66812 Obesity, class 2: Secondary | ICD-10-CM

## 2020-03-15 MED ORDER — OZEMPIC (0.25 OR 0.5 MG/DOSE) 2 MG/1.5ML ~~LOC~~ SOPN: 0.2500 mg | PEN_INJECTOR | SUBCUTANEOUS | 0 refills | Status: DC

## 2020-03-16 ENCOUNTER — Encounter (INDEPENDENT_AMBULATORY_CARE_PROVIDER_SITE_OTHER): Payer: Self-pay

## 2020-03-16 NOTE — Progress Notes (Signed)
Dear Dr. Docia Chuck,   Thank you for referring Eric [name] to our clinic. The following note includes my evaluation and treatment recommendations.  Chief Complaint:   Eric [name] (MR# [id number]) is a 70 y.o. male who presents for evaluation and treatment of Eric and related comorbidities. Current BMI is Body mass index is 39.75 kg/m. Eric [name] has been struggling with his weight for many years and has been unsuccessful in either losing weight, maintaining weight loss, or reaching his healthy weight goal.  Eric [name] is currently in the action stage of change and ready to dedicate time achieving and maintaining a healthier weight. Eric [name] is interested in becoming our patient and working on intensive lifestyle modifications including (but not limited to) diet and exercise for weight loss.  Eric [name] is a retired [employer]. He had labs on January 19.   Eric [name]'s habits were reviewed today and are as follows: His family eats meals together, he thinks his family will eat healthier with him, he struggles with family and or coworkers weight loss sabotage, his desired weight loss is 85 pounds, he started gaining weight at age 62, his heaviest weight ever was 290 pounds, he craves sweets, baked goods, ice cream, he snacks frequently in the evenings, he skips breakfast and lunch frequently, he is frequently drinking liquids with calories, he frequently makes poor food choices, he has problems with excessive hunger, he frequently eats larger portions than normal and he struggles with emotional eating.  Depression Screen Eric [name]'s Food and Mood (modified PHQ-9) score was 9.  Depression screen [facility] 2/9 03/15/2020  Decreased Interest 2  Down, Depressed, Hopeless 1  PHQ - 2 Score 3  Altered sleeping 0  Tired, decreased energy 1  Change in appetite 1  Feeling bad or failure about yourself  1  Trouble concentrating 2  Moving slowly or fidgety/restless 1  Suicidal thoughts 0   PHQ-9 Score 9  Difficult doing work/chores Somewhat difficult   Assessment/Plan:   1. Other fatigue Eric [name] admits to daytime somnolence and reports waking up still tired. Patent has a history of symptoms of daytime fatigue, morning fatigue and snoring. Eric [name] generally gets 9 hours of sleep per night, and states that he has poor quality sleep. Snoring is present. Apneic episodes are present. Epworth Sleepiness Score is 8.  Eric [name] has known OSA and uses a CPAP machine.  Eric [name] does feel that his weight is causing his energy to be lower than it should be. Fatigue may be related to Eric, depression or many other causes. Labs will be ordered, and in the meanwhile, Eric [name] will focus on self care including making healthy food choices, increasing physical activity and focusing on stress reduction.  - EKG 12-Lead  2. SOB (shortness of breath) on exertion Eric [name] notes increasing shortness of breath with exercising and seems to be worsening over time with weight gain. He notes getting out of breath sooner with activity than he used to. This has not gotten worse recently. Eric [name] denies shortness of breath at rest or orthopnea.  Eric [name] does not feel that he gets out of breath more easily that he used to when he exercises. Eric [name]'s shortness of breath appears to be Eric related and exercise induced. He has agreed to work on weight loss and gradually increase exercise to treat his exercise induced shortness of breath. Will continue to monitor closely.  3. Metabolic syndrome Starting goal: Lose 7-10% of starting weight. He will continue to focus on protein-rich, low simple carbohydrate foods. We  reviewed the importance of hydration, regular exercise for stress reduction, and restorative sleep.  We will continue to check lab work every 3 months, with 10% weight loss, or should any other concerns arise.  He denies angina.  - Start Semaglutide,0.25 or 0.5MG /DOS, (OZEMPIC, 0.25 OR 0.5 MG/DOSE,) 2  MG/1.5ML SOPN; Inject 0.25 mg into the skin once a week.  Dispense: 1.5 mL; Refill: 0  4. Visceral Eric Current visceral fat rating: 26. Visceral fat rating should be < 13. Visceral adipose tissue is a hormonally active component of total body fat. This body composition phenotype is associated with medical disorders such as metabolic syndrome, cardiovascular disease and several malignancies including prostate, breast, and colorectal cancers. Starting goal: Lose 7-10% of starting weight.   5. Bilateral carotid artery stenosis He is taking simvastatin 20 mg daily.  6. Prediabetes Goal is HgbA1c < 5.7.  A1c is 6.3.    Plan:  He will continue to focus on protein-rich, low simple carbohydrate foods. We reviewed the importance of hydration, regular exercise for stress reduction, and restorative sleep.   7. Pure hypercholesterolemia Lipid-lowering medications: simvastatin 20 mg daily.   Plan: Dietary changes: Increase soluble fiber, decrease simple carbohydrates, decrease saturated fat. Exercise changes: Moderate to vigorous-intensity aerobic activity 150 minutes per week or as tolerated. We will continue to monitor along with PCP/specialists as it pertains to his weight loss journey.  8. OSA on CPAP OSA is a cause of systemic hypertension and is associated with an increased incidence of stroke, heart failure, atrial fibrillation, and coronary heart disease. Severe OSA increases all-cause mortality and  cardiovascular mortality.   Goal: Treatment of OSA via CPAP compliance and weight loss. . Plasma ghrelin levels (appetite or "hunger hormone") are significantly higher in OSA patients than in BMI-matched controls, but decrease to levels similar to those of obese patients without OSA after CPAP treatment.  . Weight loss improves OSA by several mechanisms, including reduction in fatty tissue in the throat (i.e. parapharyngeal fat) and the tongue. Loss of abdominal fat increases mediastinal traction  on the upper airway making it less likely to collapse during sleep. . Studies have also shown that compliance with CPAP treatment improves leptin (hunger inhibitory hormone) imbalance.  9. Vitamin D deficiency Optimal goal > 50 ng/dL.  He is taking OTC vitamin D 5,000 IU daily.  Plan: Continue current OTC vitamin D supplementation.  Follow-up for routine testing of Vitamin D, at least 2-3 times per year to avoid over-replacement.  10. Other chronic pain Will follow because mobility and pain control are important for weight management.  11. Chronic fatigue He is taking methylphenidate, which is prescribed by Dr. Jennelle Human.  12. Restless legs syndrome (RLS) Periodic limb movements of sleep.  Taking Requip 3 mg at bedtime.  13. Lactose intolerance With constipation.  Takes Lactaid as needed.  14. Other obsessive-compulsive disorders He is followed by Dr. Jennelle Human in Psychiatry.  Taking risperidone 0.5 mg and Xanax 0.25 mg as needed.  15. Moderate episode of recurrent major depressive disorder (HCC) Eric [name] is struggling with emotional eating and using food for comfort to the extent that it is negatively impacting his health. He has been working on behavior modification techniques to help reduce his emotional eating and has been unsuccessful. He shows no sign of suicidal or homicidal ideations.  He is taking Wellbutrin XL 450 mg daily and Lexapro 10 mg daily.  Behavior modification techniques were discussed today to help Yanuel deal with his emotional/non-hunger eating behaviors.  Orders and  follow up as documented in patient record.   16. At risk for heart disease Due to Eric [name]'s current state of health and medical condition(s), he is at a higher risk for heart disease.  This puts the patient at much greater risk to subsequently develop cardiopulmonary conditions that can significantly affect patient's quality of life in a negative manner.    At least 8 minutes were spent on counseling  Eric [name] about these concerns today,. Counseling:  Intensive lifestyle modifications were discussed with Eric [name] as the most appropriate first line of treatment.  he will continue to work on diet, exercise, and weight loss efforts.  We will continue to reassess these conditions on a fairly regular basis in an attempt to decrease the patient's overall morbidity and mortality.  Evidence-based interventions for health behavior change were utilized today including the discussion of self monitoring techniques, problem-solving barriers, and SMART goal setting techniques.  Specifically, regarding patient's less desirable eating habits and patterns, we employed the technique of small changes when Armany has not been able to fully commit to his prudent nutritional plan.  17. Class 2 severe Eric with serious comorbidity and body mass index (BMI) of 39.0 to 39.9 in adult, unspecified Eric type [name])  Eric [name] is currently in the action stage of change and his goal is to continue with weight loss efforts. I recommend Eric [name] begin the structured treatment plan as follows:  He has agreed to the Category 4 Plan.  Exercise goals: As is.   Behavioral modification strategies: increasing lean protein intake, decreasing simple carbohydrates, increasing vegetables, increasing water intake, decreasing liquid calories, decreasing alcohol intake, decreasing sodium intake and increasing high fiber foods.  He was informed of the importance of frequent follow-up visits to maximize his success with intensive lifestyle modifications for his multiple health conditions. He was informed we would discuss his lab results at his next visit unless there is a critical issue that needs to be addressed sooner. Jaran agreed to keep his next visit at the agreed upon time to discuss these results.  Objective:   Blood pressure 134/73, pulse 64, temperature 97.8 F (36.6 C), temperature source Oral, height 5\' 11"  (1.803 m), weight  285 lb (129.3 kg), SpO2 95 %. Body mass index is 39.75 kg/m.  EKG: Normal sinus rhythm, rate 68 bpm.  Indirect Calorimeter completed today shows a VO2 of 360 and a REE of 2507.  His calculated basal metabolic rate is thus his basal metabolic rate is better than expected.  General: Cooperative, alert, well developed, in no acute distress. HEENT: Conjunctivae and lids unremarkable. Cardiovascular: Regular rhythm.  Lungs: Normal work of breathing. Neurologic: No focal deficits.   Attestation Statements:   This is the patient's first visit at [facility]. The patient's NEW PATIENT PACKET was reviewed at length. Included in the packet: current and past health history, medications, allergies, ROS, gynecologic history (women only), surgical history, family history, social history, weight history, weight loss [name] history (for those that have had weight loss [name]), nutritional evaluation, mood and food questionnaire, PHQ9, Epworth questionnaire, sleep habits questionnaire, patient life and health improvement goals questionnaire. These will all be scanned into the patient's chart under media.   During the visit, I independently reviewed the patient's EKG, bioimpedance scale results, and indirect calorimeter results. I used this information to tailor a meal plan for the patient that will help him to lose weight and will improve his Eric-related conditions going forward. I performed a medically necessary appropriate examination and/or  evaluation. I discussed the assessment and treatment plan with the patient. The patient was provided an opportunity to ask questions and all were answered. The patient agreed with the plan and demonstrated an understanding of the instructions. Labs were ordered at this visit and will be reviewed at the next visit unless more critical results need to be addressed immediately. Clinical information was updated and documented in the EMR.   I, ComptrollerAmber  Agner, CMA, am acting as transcriptionist for Helane RimaErica Paxtyn Boyar, DO  I have reviewed the above documentation for accuracy and completeness, and I agree with the above. Helane Rima- Roper Tolson, DO

## 2020-03-17 ENCOUNTER — Encounter: Payer: Self-pay | Admitting: Psychiatry

## 2020-03-17 ENCOUNTER — Ambulatory Visit (INDEPENDENT_AMBULATORY_CARE_PROVIDER_SITE_OTHER): Payer: No Typology Code available for payment source | Admitting: Psychiatry

## 2020-03-17 ENCOUNTER — Other Ambulatory Visit: Payer: Self-pay

## 2020-03-17 VITALS — BP 125/67 | HR 64

## 2020-03-17 DIAGNOSIS — F331 Major depressive disorder, recurrent, moderate: Secondary | ICD-10-CM | POA: Diagnosis not present

## 2020-03-17 DIAGNOSIS — F422 Mixed obsessional thoughts and acts: Secondary | ICD-10-CM | POA: Diagnosis not present

## 2020-03-17 DIAGNOSIS — F5221 Male erectile disorder: Secondary | ICD-10-CM

## 2020-03-17 DIAGNOSIS — G4733 Obstructive sleep apnea (adult) (pediatric): Secondary | ICD-10-CM | POA: Diagnosis not present

## 2020-03-17 DIAGNOSIS — R7989 Other specified abnormal findings of blood chemistry: Secondary | ICD-10-CM

## 2020-03-17 DIAGNOSIS — G2581 Restless legs syndrome: Secondary | ICD-10-CM

## 2020-03-17 DIAGNOSIS — H903 Sensorineural hearing loss, bilateral: Secondary | ICD-10-CM | POA: Diagnosis not present
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
## 2020-03-18 ENCOUNTER — Encounter (INDEPENDENT_AMBULATORY_CARE_PROVIDER_SITE_OTHER): Payer: Self-pay | Admitting: Family Medicine

## 2020-03-29 ENCOUNTER — Ambulatory Visit (INDEPENDENT_AMBULATORY_CARE_PROVIDER_SITE_OTHER): Payer: BLUE CROSS/BLUE SHIELD | Admitting: Family Medicine

## 2020-03-29 ENCOUNTER — Other Ambulatory Visit: Payer: Self-pay

## 2020-03-29 ENCOUNTER — Encounter (INDEPENDENT_AMBULATORY_CARE_PROVIDER_SITE_OTHER): Payer: Self-pay | Admitting: Family Medicine

## 2020-03-29 VITALS — BP 134/75 | HR 69 | Temp 98.2°F | Ht 71.0 in | Wt 276.0 lb

## 2020-03-29 DIAGNOSIS — E65 Localized adiposity: Secondary | ICD-10-CM

## 2020-03-29 DIAGNOSIS — Z9189 Other specified personal risk factors, not elsewhere classified: Secondary | ICD-10-CM

## 2020-03-29 DIAGNOSIS — R5382 Chronic fatigue, unspecified: Secondary | ICD-10-CM | POA: Diagnosis not present

## 2020-03-29 DIAGNOSIS — E8881 Metabolic syndrome: Secondary | ICD-10-CM

## 2020-03-29 DIAGNOSIS — E7800 Pure hypercholesterolemia, unspecified: Secondary | ICD-10-CM | POA: Diagnosis not present

## 2020-03-29 DIAGNOSIS — Z6838 Body mass index (BMI) 38.0-38.9, adult: Secondary | ICD-10-CM

## 2020-03-30 DIAGNOSIS — E7800 Pure hypercholesterolemia, unspecified: Secondary | ICD-10-CM | POA: Insufficient documentation

## 2020-03-30 DIAGNOSIS — R7303 Prediabetes: Secondary | ICD-10-CM | POA: Insufficient documentation

## 2020-03-30 DIAGNOSIS — I351 Nonrheumatic aortic (valve) insufficiency: Secondary | ICD-10-CM | POA: Insufficient documentation

## 2020-03-30 DIAGNOSIS — F331 Major depressive disorder, recurrent, moderate: Secondary | ICD-10-CM | POA: Insufficient documentation

## 2020-03-30 DIAGNOSIS — I6523 Occlusion and stenosis of bilateral carotid arteries: Secondary | ICD-10-CM | POA: Insufficient documentation

## 2020-03-30 DIAGNOSIS — G4733 Obstructive sleep apnea (adult) (pediatric): Secondary | ICD-10-CM | POA: Insufficient documentation

## 2020-03-30 DIAGNOSIS — G8929 Other chronic pain: Secondary | ICD-10-CM | POA: Insufficient documentation

## 2020-03-30 DIAGNOSIS — G2581 Restless legs syndrome: Secondary | ICD-10-CM | POA: Insufficient documentation

## 2020-03-30 DIAGNOSIS — E559 Vitamin D deficiency, unspecified: Secondary | ICD-10-CM | POA: Insufficient documentation

## 2020-03-30 MED ORDER — OZEMPIC (0.25 OR 0.5 MG/DOSE) 2 MG/1.5ML ~~LOC~~ SOPN: 0.5000 mg | PEN_INJECTOR | SUBCUTANEOUS | 0 refills | Status: DC

## 2020-03-31 NOTE — Progress Notes (Signed)
Chief Complaint:   OBESITY Eric [name] is here to discuss his progress with his obesity treatment plan along with follow-up of his obesity related diagnoses.   Today's visit was #: 2 Starting weight: 285 lbs Starting date: 03/15/2020 Today's weight: 276 lbs Today's date: 03/29/2020 Total lbs lost to date: 9 lbs Body mass index is 38.49 kg/m.  Total weight loss percentage to date: -3.16%  Interim History:  Eric [name]'s wife is with him today.  We reviewed his meal plan recommendations.  Current Meal Plan: the Category 4 Plan for 80% of the time.  Current Exercise Plan: Walking/strength/pilates for 30-45 minutes 3-5 times per week. Current Anti-Obesity Medications: Ozempic 0.25 mg subcutaneously weekly. Side effects: None.  Assessment/Plan:   1. Metabolic syndrome Starting goal: Lose 7-10% of starting weight. He will continue to focus on protein-rich, low simple carbohydrate foods. We reviewed the importance of hydration, regular exercise for stress reduction, and restorative sleep.  We will continue to check lab work every 3 months, with 10% weight loss, or should any other concerns arise.  Plan:  Increase Ozempic to 0.5 mg subcutaneously weekly.  Refill sent to pharmacy today.  - Increase and refill Semaglutide,0.25 or 0.5MG /DOS, (OZEMPIC, 0.25 OR 0.5 MG/DOSE,) 2 MG/1.5ML SOPN; Inject 0.5 mg into the skin once a week.  Dispense: 1.5 mL; Refill: 0  2. Visceral obesity Current visceral fat rating: 25. Visceral fat rating should be < 13. Visceral adipose tissue is a hormonally active component of total body fat. This body composition phenotype is associated with medical disorders such as metabolic syndrome, cardiovascular disease and several malignancies including prostate, breast, and colorectal cancers. Starting goal: Lose 7-10% of starting weight.   3. Pure hypercholesterolemia Lipid-lowering medications: simvastatin.   Plan: Dietary changes: Increase soluble fiber, decrease simple  carbohydrates, decrease saturated fat. Exercise changes: Moderate to vigorous-intensity aerobic activity 150 minutes per week or as tolerated. We will continue to monitor along with PCP/specialists as it pertains to his weight loss journey.  4. Chronic fatigue Followed by Dr. Jennelle Human.  He takes methylphenidate 30 mg twice daily.  Plan:  Discussed sleep hygiene, circadian rhythm, regular schedule.  5. At risk for heart disease Due to Eric [name]'s current state of health and medical condition(s), he is at a higher risk for heart disease.  This puts the patient at much greater risk to subsequently develop cardiopulmonary conditions that can significantly affect patient's quality of life in a negative manner. At least 9 minutes were spent on counseling Eric [name] about these concerns today. Evidence-based interventions for health behavior change were utilized today including the discussion of self monitoring techniques, problem-solving barriers, and SMART goal setting techniques.  Specifically, regarding patient's less desirable eating habits and patterns, we employed the technique of small changes when Eric [name] has not been able to fully commit to his prudent nutritional plan.  6. Class 2 severe obesity with serious comorbidity and body mass index (BMI) of 38.0 to 38.9 in adult, unspecified obesity type Eric [name])  Course: Eric [name] is currently in the action stage of change. As such, his goal is to continue with weight loss efforts.   Nutrition goals: He has agreed to the Category 4 Plan.   Exercise goals: As is.  Behavioral modification strategies: increasing lean protein intake, decreasing simple carbohydrates, increasing vegetables and increasing water intake.  Eric [name] has agreed to follow-up with our clinic in 3 weeks. He was informed of the importance of frequent follow-up visits to maximize his success with intensive lifestyle modifications for his  multiple health conditions.   Objective:   Blood  pressure 134/75, pulse 69, temperature 98.2 F (36.8 C), temperature source Oral, height 5\' 11"  (1.803 m), weight 276 lb (125.2 kg), SpO2 95 %. Body mass index is 38.49 kg/m.  General: Cooperative, alert, well developed, in no acute distress. HEENT: Conjunctivae and lids unremarkable. Cardiovascular: Regular rhythm.  Lungs: Normal work of breathing. Neurologic: No focal deficits.   Attestation Statements:   Reviewed by clinician on day of visit: allergies, medications, problem list, medical history, surgical history, family history, social history, and previous encounter notes.  I, , CMA, am acting as transcriptionist for Insurance claims handler, DO  I have reviewed the above documentation for accuracy and completeness, and I agree with the above. Helane Rima, DO

## 2020-04-13 ENCOUNTER — Ambulatory Visit (INDEPENDENT_AMBULATORY_CARE_PROVIDER_SITE_OTHER): Payer: BLUE CROSS/BLUE SHIELD | Admitting: Physician Assistant

## 2020-04-14 ENCOUNTER — Ambulatory Visit (AMBULATORY_SURGERY_CENTER): Payer: Self-pay | Admitting: *Deleted

## 2020-04-14 ENCOUNTER — Encounter: Payer: Self-pay | Admitting: Psychiatry

## 2020-04-14 ENCOUNTER — Other Ambulatory Visit: Payer: Self-pay

## 2020-04-14 ENCOUNTER — Other Ambulatory Visit: Payer: Self-pay | Admitting: Psychiatry

## 2020-04-14 ENCOUNTER — Ambulatory Visit (INDEPENDENT_AMBULATORY_CARE_PROVIDER_SITE_OTHER): Payer: BLUE CROSS/BLUE SHIELD | Admitting: Psychiatry

## 2020-04-14 VITALS — BP 137/58 | HR 73

## 2020-04-14 VITALS — Ht 71.0 in | Wt 283.0 lb

## 2020-04-14 DIAGNOSIS — Z1211 Encounter for screening for malignant neoplasm of colon: Secondary | ICD-10-CM

## 2020-04-14 DIAGNOSIS — G2581 Restless legs syndrome: Secondary | ICD-10-CM

## 2020-04-14 DIAGNOSIS — F5221 Male erectile disorder: Secondary | ICD-10-CM | POA: Diagnosis not present

## 2020-04-14 DIAGNOSIS — F422 Mixed obsessional thoughts and acts: Secondary | ICD-10-CM | POA: Diagnosis not present

## 2020-04-14 DIAGNOSIS — F331 Major depressive disorder, recurrent, moderate: Secondary | ICD-10-CM | POA: Diagnosis not present

## 2020-04-14 DIAGNOSIS — G4733 Obstructive sleep apnea (adult) (pediatric): Secondary | ICD-10-CM

## 2020-04-14 LAB — TESTOSTERONE: Testosterone: 306 ng/dL (ref 250–827)
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
## 2020-04-14 NOTE — Progress Notes (Signed)
No egg or soy allergy known to patient  No issues with past sedation with any surgeries or procedures Patient denies ever being told they had issues or difficulty with intubation  No FH of Malignant Hyperthermia No diet pills per patient No home 02 use per patient  No blood thinners per patient  Pt denies issues with constipation - in the past constipation an issue- very irregular - has hard to soft stools - USES MiraLax prn AND DIET CHANGES HELP - NO DAILY ISSUES OF CONSTIPATION  No A fib or A flutter  EMMI video to pt or via MyChart  COVID 19 guidelines implemented in [facility] today with Pt and RN  Pt is fully vaccinated  for Covid    Due to the COVID-19 pandemic we are asking patients to follow certain guidelines.  Pt aware of COVID protocols and [facility] guidelines

## 2020-04-15 NOTE — Progress Notes (Signed)
Not surprisingly, level is in the normal range but the low normal range.  Please inform him.  I am unable to send results to his PCP Dibas Docia Chuck, MD, [facility]ering in [place].   He will need to discuss the next step with PCP.   Testosterone   results  306            Ref Range & Units         250 - 827 ng/dL

## 2020-04-19 ENCOUNTER — Encounter (INDEPENDENT_AMBULATORY_CARE_PROVIDER_SITE_OTHER): Payer: Self-pay | Admitting: Physician Assistant

## 2020-04-19 ENCOUNTER — Ambulatory Visit (INDEPENDENT_AMBULATORY_CARE_PROVIDER_SITE_OTHER): Payer: BLUE CROSS/BLUE SHIELD | Admitting: Physician Assistant

## 2020-04-19 ENCOUNTER — Other Ambulatory Visit: Payer: Self-pay

## 2020-04-19 VITALS — BP 130/73 | HR 65 | Temp 97.8°F | Ht 71.0 in | Wt 272.0 lb

## 2020-04-19 DIAGNOSIS — Z9189 Other specified personal risk factors, not elsewhere classified: Secondary | ICD-10-CM

## 2020-04-19 DIAGNOSIS — E8881 Metabolic syndrome: Secondary | ICD-10-CM | POA: Diagnosis not present

## 2020-04-19 DIAGNOSIS — R7303 Prediabetes: Secondary | ICD-10-CM

## 2020-04-19 DIAGNOSIS — Z6839 Body mass index (BMI) 39.0-39.9, adult: Secondary | ICD-10-CM | POA: Diagnosis not present

## 2020-04-25 NOTE — Progress Notes (Signed)
Chief Complaint:   OBESITY Eric [name] is here to discuss his progress with his obesity treatment plan along with follow-up of his obesity related diagnoses. Eric [name] is on the Category 4 Plan and states he is following his eating plan approximately 50% of the time. Eric [name] states he is exercising with a personal trainer for 30-45 minutes 3 times per week.  Today's visit was #: 3 Starting weight: 285 lbs Starting date: 03/15/2020 Today's weight: 272 lbs Today's date: 04/19/2020 Total lbs lost to date: 13 Total lbs lost since last in-office visit: 4  Interim History: Eric [name] reports that he traveled to [place] and he did not follow the plan closely. His hunger is controlled.  Subjective:   1. Metabolic syndrome Eric [name] is on Ozempic and it is working well to control his hunger. His cravings have decreased as well.  2. Pre-diabetes Eric [name] is on Ozempic without side effects.  3. At risk for diabetes mellitus Eric [name] is at higher than average risk for developing diabetes due to obesity.   Assessment/Plan:   1. Metabolic syndrome Eric [name] will continue Ozempic 0.5 mg weekly, and he will follow up as directed.  2. Pre-diabetes Eric [name] will continue his medications and meal plan, and will continue to work on weight loss, exercise, and decreasing simple carbohydrates to help decrease the risk of diabetes.   3. At risk for diabetes mellitus Eric [name] was given approximately 15 minutes of diabetes education and counseling today. We discussed intensive lifestyle modifications today with an emphasis on weight loss as well as increasing exercise and decreasing simple carbohydrates in his diet. We also reviewed medication options with an emphasis on risk versus benefit of those discussed.   Repetitive spaced learning was employed today to elicit superior memory formation and behavioral change.  4. Class 2 severe obesity with serious comorbidity and body mass index (BMI) of 39.0 to 39.9 in  adult, unspecified obesity type [name] Eric [name] is currently in the action stage of change. As such, his goal is to continue with weight loss efforts. He has agreed to the Category 4 Plan + 100 calories.   Exercise goals: As is.  Behavioral modification strategies: meal planning and cooking strategies and keeping healthy foods in the home.  Eric [name] has agreed to follow-up with our clinic in 2 weeks. He was informed of the importance of frequent follow-up visits to maximize his success with intensive lifestyle modifications for his multiple health conditions.   Objective:   Blood pressure 130/73, pulse 65, temperature 97.8 F (36.6 C), height 5\' 11"  (1.803 m), weight 272 lb (123.4 kg), SpO2 95 %. Body mass index is 37.94 kg/m.  General: Cooperative, alert, well developed, in no acute distress. HEENT: Conjunctivae and lids unremarkable. Cardiovascular: Regular rhythm.  Lungs: Normal work of breathing. Neurologic: No focal deficits.   No results found for: CREATININE, BUN, NA, K, CL, CO2 No results found for: ALT, AST, GGT, ALKPHOS, BILITOT No results found for: HGBA1C No results found for: INSULIN No results found for: TSH No results found for: CHOL, HDL, LDLCALC, LDLDIRECT, TRIG, CHOLHDL No results found for: WBC, HGB, HCT, MCV, PLT No results found for: IRON, TIBC, FERRITIN  Attestation Statements:   Reviewed by clinician on day of visit: allergies, medications, problem list, medical history, surgical history, family history, social history, and previous encounter notes.   , am acting as transcriptionist for Trude Mcburney, PA-C.  I have reviewed the above documentation for accuracy and completeness, and I agree with the above. -  *  Abby Potash, PA-C

## 2020-04-26 ENCOUNTER — Encounter: Payer: Self-pay | Admitting: Internal Medicine

## 2020-04-28 ENCOUNTER — Ambulatory Visit (AMBULATORY_SURGERY_CENTER): Payer: BLUE CROSS/BLUE SHIELD | Admitting: Internal Medicine

## 2020-04-28 ENCOUNTER — Encounter: Payer: Self-pay | Admitting: Internal Medicine

## 2020-04-28 ENCOUNTER — Other Ambulatory Visit: Payer: Self-pay

## 2020-04-28 VITALS — BP 120/75 | HR 64 | Temp 95.5°F | Resp 11 | Ht 71.0 in | Wt 283.0 lb

## 2020-04-28 DIAGNOSIS — Z1211 Encounter for screening for malignant neoplasm of colon: Secondary | ICD-10-CM | POA: Diagnosis not present

## 2020-04-28 MED ORDER — SODIUM CHLORIDE 0.9 % IV SOLN
500.0000 mL | Freq: Once | INTRAVENOUS | Status: DC
Start: 2020-04-28 — End: 2020-04-28

## 2020-04-28 NOTE — Patient Instructions (Addendum)
It was difficult but we were able to reach the beginning of the colon.  No polyps or cancer seen.  Mild diverticulosis detected - please read the handout.   No need for further routine colonoscopy or stool tests.  Due to the abdominal binder and pressure applied your abdomen and ribs might be a little sore. This should go away soon.  I appreciate the opportunity to care for you. Iva Boop, MD, [facility] Resume previous diet Continue current medications No repeat colonoscopy needed for surveillance   YOU HAD AN ENDOSCOPIC PROCEDURE TODAY AT THE [facility]:   Refer to the procedure report that was given to you for any specific questions about what was found during the examination.  If the procedure report does not answer your questions, please call your gastroenterologist to clarify.  If you requested that your care partner not be given the details of your procedure findings, then the procedure report has been included in a sealed envelope for you to review at your convenience later.  YOU SHOULD EXPECT: Some feelings of bloating in the abdomen. Passage of more gas than usual.  Walking can help get rid of the air that was put into your GI tract during the procedure and reduce the bloating. If you had a lower endoscopy (such as a colonoscopy or flexible sigmoidoscopy) you may notice spotting of blood in your stool or on the toilet paper. If you underwent a bowel prep for your procedure, you may not have a normal bowel movement for a few days.  Please Note:  You might notice some irritation and congestion in your nose or some drainage.  This is from the oxygen used during your procedure.  There is no need for concern and it should clear up in a day or so.  SYMPTOMS TO REPORT IMMEDIATELY:   Following lower endoscopy (colonoscopy or flexible sigmoidoscopy):  Excessive amounts of blood in the stool  Significant tenderness or worsening of abdominal pains  Swelling of the abdomen  that is new, acute  Fever of 100F or higher  For urgent or emergent issues, a gastroenterologist can be reached at any hour by calling [phone]. Do not use MyChart messaging for urgent concerns.   DIET:  We do recommend a small meal at first, but then you may proceed to your regular diet.  Drink plenty of fluids but you should avoid alcoholic beverages for 24 hours.  ACTIVITY:  You should plan to take it easy for the rest of today and you should NOT DRIVE or use heavy machinery until tomorrow (because of the sedation medicines used during the test).    FOLLOW UP: Our staff will call the number listed on your records 48-72 hours following your procedure to check on you and address any questions or concerns that you may have regarding the information given to you following your procedure. If we do not reach you, we will leave a message.  We will attempt to reach you two times.  During this call, we will ask if you have developed any symptoms of COVID 19. If you develop any symptoms (ie: fever, flu-like symptoms, shortness of breath, cough etc.) before then, please call [phone].  If you test positive for Covid 19 in the 2 weeks post procedure, please call and report this information to [place].    If any biopsies were taken you will be contacted by phone or by letter within the next 1-3 weeks.  Please call us at [phone]  if you have not heard about the biopsies in 3 weeks.   SIGNATURES/CONFIDENTIALITY: You and/or your care partner have signed paperwork which will be entered into your electronic medical record.  These signatures attest to the fact that that the information above on your After Visit Summary has been reviewed and is understood.  Full responsibility of the confidentiality of this discharge information lies with you and/or your care-partner.

## 2020-04-28 NOTE — Progress Notes (Signed)
Pt's states no medical or surgical changes since previsit or office visit.  VS taken by [place] 

## 2020-04-28 NOTE — Op Note (Signed)
[facility] Patient Name: Eric [name] Procedure Date: 04/28/2020 10:53 AM MRN: [id number] Endoscopist: Iva Boop , MD Age: 70 Referring MD:  Date of Birth: Jun 12, 1950 Gender: Male Account #: [id number] Procedure:                Colonoscopy Indications:              Screening for colorectal malignant neoplasm, Last                            colonoscopy: 2012 Incomplete - had subsequent BE Medicines:                Propofol per Anesthesia, Monitored Anesthesia Care Procedure:                Pre-Anesthesia Assessment:                           - Prior to the procedure, a History and Physical                            was performed, and patient medications and                            allergies were reviewed. The patient's tolerance of                            previous anesthesia was also reviewed. The risks                            and benefits of the procedure and the sedation                            options and risks were discussed with the patient.                            All questions were answered, and informed consent                            was obtained. Prior Anticoagulants: The patient has                            taken no previous anticoagulant or antiplatelet                            agents. ASA Grade Assessment: II - A patient with                            mild systemic disease. After reviewing the risks                            and benefits, the patient was deemed in                            satisfactory condition to undergo the procedure.  After obtaining informed consent, the colonoscope                            was passed under direct vision. Throughout the                            procedure, the patient's blood pressure, pulse, and                            oxygen saturations were monitored continuously. The                            Colonoscope was introduced through the anus and                             advanced to the the cecum, identified by                            appendiceal orifice and ileocecal valve. The                            colonoscopy was performed with difficulty due to a                            redundant colon, significant looping and a tortuous                            colon. Successful completion of the procedure was                            aided by using abdominal binder and manual                            pressure. The patient tolerated the procedure                            fairly well. The quality of the bowel preparation                            was excellent. The ileocecal valve, appendiceal                            orifice, and rectum were photographed. The bowel                            preparation used was Miralax via split dose                            instruction. Scope In: 11:09:10 AM Scope Out: 11:33:10 AM Scope Withdrawal Time: 0 hours 11 minutes 18 seconds  Total Procedure Duration: 0 hours 24 minutes 0 seconds  Findings:                 The perianal and digital rectal examinations were  normal. Pertinent negatives include normal prostate                            (size, shape, and consistency).                           Scattered diverticula were found in the left colon.                           The colon (entire examined portion) was                            significantly redundant.                           The left colon was significantly tortuous.                           The exam was otherwise without abnormality on                            direct and retroflexion views.                           Skin tags were found on perianal exam. Complications:            No immediate complications. Estimated Blood Loss:     Estimated blood loss: none. Impression:               - Diverticulosis in the left colon. Scattered.                           - Redundant colon.                            - Tortuous colon.                           - The examination was otherwise normal on direct                            and retroflexion views.                           - Perianal skin tags found on perianal exam.                           - No specimens collected. Recommendation:           - Patient has a contact number available for                            emergencies. The signs and symptoms of potential                            delayed complications were discussed with the  patient. Return to normal activities tomorrow.                            Written discharge instructions were provided to the                            patient.                           - Resume previous diet.                           - Continue present medications.                           - No repeat colonoscopy due to current age (80                            years or older) and the absence of colonic polyps.                           -SHOULD HE NEED A DIAGNOSTIC COLONOSCOPY IN FUTURE                            WOULD DEFINITELY APPLY AN ABDOMINAL BINDER AGAIN.                            NEED XL OR GREATER Iva Boop, MD 04/28/2020 11:44:02 AM This report has been signed electronically.

## 2020-04-28 NOTE — Progress Notes (Signed)
approx 1128 binder off (binder put on before induction).  1130 pt noted to be gupping.  Sedation halted and suctioning started.  Small amounts spittle collected but no bile colored fluid.  Pt alert and able to swallow and cough on command before end of procedure   BS clear VSS

## 2020-05-02 ENCOUNTER — Telehealth: Payer: Self-pay | Admitting: *Deleted

## 2020-05-02 ENCOUNTER — Telehealth: Payer: Self-pay

## 2020-05-02 NOTE — Telephone Encounter (Signed)
  Follow up Call-  Call back number 04/28/2020  Post procedure Call Back phone  # [phone]  Permission to leave phone message Yes  Some recent data might be hidden     Patient questions:  Message left to call us if necessary.

## 2020-05-02 NOTE — Telephone Encounter (Signed)
  Follow up Call-  Call back number 04/28/2020  Post procedure Call Back phone  # [phone]  Permission to leave phone message Yes  Some recent data might be hidden     Patient questions:  Do you have a fever, pain , or abdominal swelling? No. Pain Score  0 *  Have you tolerated food without any problems? Yes.    Have you been able to return to your normal activities? Yes.    Do you have any questions about your discharge instructions: Diet   No. Medications  No. Follow up visit  No.  Do you have questions or concerns about your Care? No.  Actions: * If pain score is 4 or above: No action needed, pain <4. 1. Have you developed a fever since your procedure? no  2.   Have you had an respiratory symptoms (SOB or cough) since your procedure? no  3.   Have you tested positive for COVID 19 since your procedure no  4.   Have you had any family members/close contacts diagnosed with the COVID 19 since your procedure?  no   If yes to any of these questions please route to Laverna Peace, RN and Karlton Lemon, RN

## 2020-05-04 ENCOUNTER — Ambulatory Visit (INDEPENDENT_AMBULATORY_CARE_PROVIDER_SITE_OTHER): Payer: BLUE CROSS/BLUE SHIELD | Admitting: Physician Assistant

## 2020-05-04 ENCOUNTER — Ambulatory Visit (INDEPENDENT_AMBULATORY_CARE_PROVIDER_SITE_OTHER): Payer: BLUE CROSS/BLUE SHIELD | Admitting: Family Medicine

## 2020-05-04 ENCOUNTER — Other Ambulatory Visit: Payer: Self-pay

## 2020-05-04 ENCOUNTER — Encounter (INDEPENDENT_AMBULATORY_CARE_PROVIDER_SITE_OTHER): Payer: Self-pay | Admitting: Family Medicine

## 2020-05-04 VITALS — BP 137/88 | HR 74 | Temp 98.3°F | Ht 71.0 in | Wt 271.0 lb

## 2020-05-04 DIAGNOSIS — E8881 Metabolic syndrome: Secondary | ICD-10-CM

## 2020-05-04 DIAGNOSIS — R7303 Prediabetes: Secondary | ICD-10-CM | POA: Diagnosis not present

## 2020-05-04 DIAGNOSIS — Z9189 Other specified personal risk factors, not elsewhere classified: Secondary | ICD-10-CM | POA: Diagnosis not present

## 2020-05-04 DIAGNOSIS — Z6839 Body mass index (BMI) 39.0-39.9, adult: Secondary | ICD-10-CM

## 2020-05-04 MED ORDER — OZEMPIC (0.25 OR 0.5 MG/DOSE) 2 MG/1.5ML ~~LOC~~ SOPN: 0.5000 mg | PEN_INJECTOR | SUBCUTANEOUS | 1 refills | Status: DC

## 2020-05-06 DIAGNOSIS — Z23 Encounter for immunization: Secondary | ICD-10-CM | POA: Diagnosis not present

## 2020-05-10 ENCOUNTER — Ambulatory Visit (INDEPENDENT_AMBULATORY_CARE_PROVIDER_SITE_OTHER): Payer: BLUE CROSS/BLUE SHIELD | Admitting: Family Medicine

## 2020-05-12 ENCOUNTER — Other Ambulatory Visit: Payer: Self-pay

## 2020-05-12 ENCOUNTER — Encounter: Payer: Self-pay | Admitting: Psychiatry

## 2020-05-12 ENCOUNTER — Ambulatory Visit (INDEPENDENT_AMBULATORY_CARE_PROVIDER_SITE_OTHER): Payer: BLUE CROSS/BLUE SHIELD | Admitting: Psychiatry

## 2020-05-12 VITALS — BP 118/72 | HR 65

## 2020-05-12 DIAGNOSIS — G4733 Obstructive sleep apnea (adult) (pediatric): Secondary | ICD-10-CM | POA: Diagnosis not present

## 2020-05-12 DIAGNOSIS — F422 Mixed obsessional thoughts and acts: Secondary | ICD-10-CM | POA: Diagnosis not present

## 2020-05-12 DIAGNOSIS — F5221 Male erectile disorder: Secondary | ICD-10-CM

## 2020-05-12 DIAGNOSIS — F331 Major depressive disorder, recurrent, moderate: Secondary | ICD-10-CM | POA: Diagnosis not present

## 2020-05-12 DIAGNOSIS — G2581 Restless legs syndrome: Secondary | ICD-10-CM | POA: Diagnosis not present

## 2020-05-12 DIAGNOSIS — R7989 Other specified abnormal findings of blood chemistry: Secondary | ICD-10-CM

## 2020-05-12 MED ORDER — METHYLPHENIDATE HCL 10 MG PO TABS: 30.0000 mg | ORAL_TABLET | Freq: Two times a day (BID) | ORAL | 0 refills | Status: DC
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
## 2020-05-17 NOTE — Progress Notes (Signed)
Chief Complaint:   OBESITY Eric [name] is here to discuss his progress with his obesity treatment plan along with follow-up of his obesity related diagnoses. Delvon is on the Category 4 Plan and states he is following his eating plan approximately 50% of the time. Easton states he is going to the gym 30 minutes 3 times per week.  Today's visit was #: 4 Starting weight: 285 lbs Starting date: 03/15/2020 Today's weight: 271 lbs Today's date: 05/04/2020 Total lbs lost to date: 14 lbs Total lbs lost since last in-office visit: 1  Interim History: Winfield reports indulgent eating over the past few weeks. He had a screening colonoscopy last Thursday 04/28/20 and had to discontinue plan for bowel prep. He is struggling to follow the plan due to differing options with his wife for mea prep. He is tolerating Ozempic thus far. His wife is supportive, but nervous about being restricted to his diet. Pt enjoys nighttime snacking.  Subjective:   1. Pre-diabetes Harlie is working towards reducing simple carbs and weight loss. He is tolerating Ozempic so far.   2. Metabolic syndrome Jsoeph is on GLP-1 and category 4 with additional 100 calories. Initial labs done 02/2020.  3. At risk for diabetes mellitus Kadrian is at higher than average risk for developing diabetes due to obesity.   Assessment/Plan:   1. Pre-diabetes Coral will continue to work on weight loss, exercise, and decreasing simple carbohydrates to help decrease the risk of diabetes. Continue Ozempic 0.5 mg weekly for now. Rx sent.  2. Metabolic syndrome Repeat labs in early June 2022.  - Semaglutide,0.25 or 0.5MG /DOS, (OZEMPIC, 0.25 OR 0.5 MG/DOSE,) 2 MG/1.5ML SOPN; Inject 0.5 mg into the skin once a week.  Dispense: 1.5 mL; Refill: 1  3. At risk for diabetes mellitus Bruin was given approximately 15 minutes of diabetes education and counseling today. We discussed intensive lifestyle modifications today with an emphasis on  weight loss as well as increasing exercise and decreasing simple carbohydrates in his diet. We also reviewed medication options with an emphasis on risk versus benefit of those discussed.   Repetitive spaced learning was employed today to elicit superior memory formation and behavioral change.  4. Class 2 severe obesity with serious comorbidity and body mass index (BMI) of 39.0 to 39.9 in adult, unspecified obesity type [facility]) Jahkai is currently in the action stage of change. As such, his goal is to continue with weight loss efforts. He has agreed to the Category 4 Plan + 100 calories.   Exercise goals: As is  Behavioral modification strategies: increasing lean protein intake, ways to avoid night time snacking, emotional eating strategies and dealing with family or coworker sabotage. Provided with alternatives/plan appropriate changes that would benefit both patient and wife to improve plan adherence.  Nazaiah has agreed to follow-up with our clinic in 2 weeks. He was informed of the importance of frequent follow-up visits to maximize his success with intensive lifestyle modifications for his multiple health conditions.   Objective:   Blood pressure 137/88, pulse 74, temperature 98.3 F (36.8 C), height 5\' 11"  (1.803 m), weight 271 lb (122.9 kg), SpO2 96 %. Body mass index is 37.8 kg/m.  General: Cooperative, alert, well developed, in no acute distress. HEENT: Conjunctivae and lids unremarkable. Cardiovascular: Regular rhythm.  Lungs: Normal work of breathing. Neurologic: No focal deficits.   No results found for: CREATININE, BUN, NA, K, CL, CO2 No results found for: ALT, AST, GGT, ALKPHOS, BILITOT No results found for: HGBA1C No  results found for: INSULIN No results found for: TSH No results found for: CHOL, HDL, LDLCALC, LDLDIRECT, TRIG, CHOLHDL No results found for: WBC, HGB, HCT, MCV, PLT No results found for: IRON, TIBC, FERRITIN   Attestation Statements:   Reviewed by  clinician on day of visit: allergies, medications, problem list, medical history, surgical history, family history, social history, and previous encounter notes.  Edmund Hilda, am acting as transcriptionist for Reuben Likes, MD.   I have reviewed the above documentation for accuracy and completeness, and I agree with the above. - Katherina Mires, MD

## 2020-05-24 ENCOUNTER — Other Ambulatory Visit: Payer: Self-pay

## 2020-05-24 ENCOUNTER — Encounter (INDEPENDENT_AMBULATORY_CARE_PROVIDER_SITE_OTHER): Payer: Self-pay | Admitting: Family Medicine

## 2020-05-24 ENCOUNTER — Ambulatory Visit (INDEPENDENT_AMBULATORY_CARE_PROVIDER_SITE_OTHER): Payer: BLUE CROSS/BLUE SHIELD | Admitting: Family Medicine

## 2020-05-24 VITALS — BP 134/70 | HR 61 | Temp 97.7°F | Ht 71.0 in | Wt 265.0 lb

## 2020-05-24 DIAGNOSIS — E8881 Metabolic syndrome: Secondary | ICD-10-CM

## 2020-05-24 DIAGNOSIS — Z9189 Other specified personal risk factors, not elsewhere classified: Secondary | ICD-10-CM

## 2020-05-24 DIAGNOSIS — G4733 Obstructive sleep apnea (adult) (pediatric): Secondary | ICD-10-CM | POA: Diagnosis not present

## 2020-05-24 DIAGNOSIS — Z6839 Body mass index (BMI) 39.0-39.9, adult: Secondary | ICD-10-CM

## 2020-05-24 DIAGNOSIS — F331 Major depressive disorder, recurrent, moderate: Secondary | ICD-10-CM | POA: Diagnosis not present

## 2020-05-24 DIAGNOSIS — Z9989 Dependence on other enabling machines and devices: Secondary | ICD-10-CM

## 2020-05-24 DIAGNOSIS — E65 Localized adiposity: Secondary | ICD-10-CM

## 2020-05-25 MED ORDER — OZEMPIC (1 MG/DOSE) 4 MG/3ML ~~LOC~~ SOPN
1.0000 mg | PEN_INJECTOR | SUBCUTANEOUS | 0 refills | Status: DC
Start: 2020-05-25 — End: 2020-06-16

## 2020-05-31 NOTE — Progress Notes (Signed)
Chief Complaint:   OBESITY Eric [name] is here to discuss his progress with his obesity treatment plan along with follow-up of his obesity related diagnoses.   Today's visit was #: 5 Starting weight: 285 lbs Starting date: 03/15/2020 Today's weight: 265 lbs Today's date: 05/24/2020 Total lbs lost to date: 20 lbs Body mass index is 36.96 kg/m.  Total weight loss percentage to date: -7.02%  Interim History:  Eric [name] is having Orgain Premix daily - BID. He went to a funeral last week, endorses drinking liquor.  He says he is tolerating Ozempic.  He endorses more depression over world/local events. Current Meal Plan: the Category 4 Plan +100 calories.  Current Exercise Plan: Walking for 30 minutes 3 times per week. Current Anti-Obesity Medications: Ozempic 0.5 mg subcutaneously weekly.  Side effects: None.  Assessment/Plan:   1. Metabolic syndrome Starting goal: Lose 7-10% of starting weight. He will continue to focus on protein-rich, low simple carbohydrate foods. We reviewed the importance of hydration, regular exercise for stress reduction, and restorative sleep.    Plan:  Increase Ozempic to 1 mg subcutaneously weekly.  We will continue to check lab work every 3 months, with 10% weight loss, or should any other concerns arise.  - Increase Semaglutide, 1 MG/DOSE, (OZEMPIC, 1 MG/DOSE,) 4 MG/3ML SOPN; Inject 1 mg into the skin once a week.  Dispense: 3 mL; Refill: 0  2. Visceral obesity Current visceral fat rating: 25. Visceral fat rating should be < 13. Visceral adipose tissue is a hormonally active component of total body fat. This body composition phenotype is associated with medical disorders such as metabolic syndrome, cardiovascular disease and several malignancies including prostate, breast, and colorectal cancers. Starting goal: Lose 7-10% of starting weight.   3. OSA on CPAP OSA is a cause of systemic hypertension and is associated with an increased incidence of stroke, heart  failure, atrial fibrillation, and coronary heart disease. Severe OSA increases all-cause mortality and  cardiovascular mortality.   Goal: Treatment of OSA via CPAP compliance and weight loss. . Plasma ghrelin levels (appetite or "hunger hormone") are significantly higher in OSA patients than in BMI-matched controls, but decrease to levels similar to those of obese patients without OSA after CPAP treatment.  . Weight loss improves OSA by several mechanisms, including reduction in fatty tissue in the throat (i.e. parapharyngeal fat) and the tongue. Loss of abdominal fat increases mediastinal traction on the upper airway making it less likely to collapse during sleep. . Studies have also shown that compliance with CPAP treatment improves leptin (hunger inhibitory hormone) imbalance.  4. Moderate episode of recurrent major depressive disorder (HCC) Not at goal. Medication: Wellbutrin XL 450 mg daily, Lexapro 10 mg daily.  Plan:  Behavior modification techniques were discussed today to help deal with emotional/non-hunger eating behaviors.  5. At risk for heart disease Due to Eric [name]'s current state of health and medical condition(s), he is at a higher risk for heart disease.  This puts the patient at much greater risk to subsequently develop cardiopulmonary conditions that can significantly affect patient's quality of life in a negative manner.    At least 8 minutes were spent on counseling Eric [name] about these concerns today. Evidence-based interventions for health behavior change were utilized today including the discussion of self monitoring techniques, problem-solving barriers, and SMART goal setting techniques.  Specifically, regarding patient's less desirable eating habits and patterns, we employed the technique of small changes when Eric [name] has not been able to fully commit to his prudent  nutritional plan.  6. Obesity, current BMI 37.1  Course: Meet is currently in the action stage of change.  As such, his goal is to continue with weight loss efforts.   Nutrition goals: He has agreed to the Category 4 Plan.   Exercise goals: As is.  Behavioral modification strategies: increasing lean protein intake, decreasing simple carbohydrates, increasing vegetables, increasing water intake, decreasing alcohol intake, decreasing sodium intake and increasing high fiber foods.  Eric [name] has agreed to follow-up with our clinic in 3 weeks. He was informed of the importance of frequent follow-up visits to maximize his success with intensive lifestyle modifications for his multiple health conditions.   Objective:   Blood pressure 134/70, pulse 61, temperature 97.7 F (36.5 C), temperature source Oral, height 5\' 11"  (1.803 m), weight 265 lb (120.2 kg), SpO2 95 %. Body mass index is 36.96 kg/m.  General: Cooperative, alert, well developed, in no acute distress. HEENT: Conjunctivae and lids unremarkable. Cardiovascular: Regular rhythm.  Lungs: Normal work of breathing. Neurologic: No focal deficits.   Attestation Statements:   Reviewed by clinician on day of visit: allergies, medications, problem list, medical history, surgical history, family history, social history, and previous encounter notes.  I, , CMA, am acting as transcriptionist for Insurance claims handler, DO  I have reviewed the above documentation for accuracy and completeness, and I agree with the above. Helane Rima, DO

## 2020-06-07 ENCOUNTER — Other Ambulatory Visit: Payer: Self-pay

## 2020-06-07 ENCOUNTER — Ambulatory Visit (INDEPENDENT_AMBULATORY_CARE_PROVIDER_SITE_OTHER): Payer: BLUE CROSS/BLUE SHIELD | Admitting: Family Medicine

## 2020-06-07 ENCOUNTER — Encounter (INDEPENDENT_AMBULATORY_CARE_PROVIDER_SITE_OTHER): Payer: Self-pay | Admitting: Family Medicine

## 2020-06-07 VITALS — BP 134/68 | HR 69 | Temp 98.1°F | Ht 71.0 in | Wt 264.0 lb

## 2020-06-07 DIAGNOSIS — E8881 Metabolic syndrome: Secondary | ICD-10-CM | POA: Diagnosis not present

## 2020-06-07 DIAGNOSIS — F331 Major depressive disorder, recurrent, moderate: Secondary | ICD-10-CM | POA: Diagnosis not present

## 2020-06-07 DIAGNOSIS — Z9189 Other specified personal risk factors, not elsewhere classified: Secondary | ICD-10-CM | POA: Diagnosis not present

## 2020-06-07 DIAGNOSIS — E65 Localized adiposity: Secondary | ICD-10-CM

## 2020-06-07 DIAGNOSIS — Z6839 Body mass index (BMI) 39.0-39.9, adult: Secondary | ICD-10-CM

## 2020-06-09 ENCOUNTER — Other Ambulatory Visit: Payer: Self-pay

## 2020-06-09 ENCOUNTER — Encounter: Payer: Self-pay | Admitting: Psychiatry

## 2020-06-09 ENCOUNTER — Ambulatory Visit (INDEPENDENT_AMBULATORY_CARE_PROVIDER_SITE_OTHER): Payer: BLUE CROSS/BLUE SHIELD | Admitting: Psychiatry

## 2020-06-09 VITALS — BP 124/69 | HR 75

## 2020-06-09 DIAGNOSIS — F331 Major depressive disorder, recurrent, moderate: Secondary | ICD-10-CM

## 2020-06-09 DIAGNOSIS — G4733 Obstructive sleep apnea (adult) (pediatric): Secondary | ICD-10-CM | POA: Diagnosis not present

## 2020-06-09 DIAGNOSIS — N529 Male erectile dysfunction, unspecified: Secondary | ICD-10-CM

## 2020-06-09 DIAGNOSIS — G2581 Restless legs syndrome: Secondary | ICD-10-CM

## 2020-06-09 DIAGNOSIS — F422 Mixed obsessional thoughts and acts: Secondary | ICD-10-CM | POA: Diagnosis not present

## 2020-06-09 DIAGNOSIS — F5221 Male erectile disorder: Secondary | ICD-10-CM
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
## 2020-06-13 NOTE — Progress Notes (Signed)
Chief Complaint:   OBESITY Eric [name] is here to discuss his progress with his obesity treatment plan along with follow-up of his obesity related diagnoses.   Today's visit was #: 6 Starting weight: 285 lbs Starting date: 03/15/2020 Today's weight: 264 lbs Today's date: 06/07/2020 Weight change since last visit: 1 lb Total lbs lost to date: 21 lbs Body mass index is 36.82 kg/m.  Total weight loss percentage to date: -7.37%  Current Meal Plan: the Category 4 Plan for 75-80% of the time.  Current Exercise Plan: Walking/strength training for 30-45 minutes 3 times per week. Current Anti-Obesity Medications: Ozempic 1 mg subcutaneously weekly. Side effects: None.  Assessment/Plan:   1. Metabolic syndrome Starting goal: Lose 7-10% of starting weight. He will continue to focus on protein-rich, low simple carbohydrate foods. We reviewed the importance of hydration, regular exercise for stress reduction, and restorative sleep.  We will continue to check lab work every 3 months, with 10% weight loss, or should any other concerns arise.  2. Visceral obesity Current visceral fat rating: 23. Visceral fat rating should be < 13. Visceral adipose tissue is a hormonally active component of total body fat. This body composition phenotype is associated with medical disorders such as metabolic syndrome, cardiovascular disease and several malignancies including prostate, breast, and colorectal cancers. Starting goal: Lose 7-10% of starting weight.   3. Moderate episode of recurrent major depressive disorder (HCC) [name] is taking Wellbutrin XL 450 mg daily and Lexapro 10 mg daily.  He is followed by [facility].  Plan:  Continue Wellbutrin and Lexapro.  Continue to follow with Behavioral Health.  4. At risk for heart disease Due to Eric [name]'s current state of health and medical condition(s), he is at a higher risk for heart disease.  This puts the patient at much greater risk to subsequently  develop cardiopulmonary conditions that can significantly affect patient's quality of life in a negative manner.   At least 8 minutes were spent on counseling Rexford about these concerns today. Evidence-based interventions for health behavior change were utilized today including the discussion of self monitoring techniques, problem-solving barriers, and SMART goal setting techniques.  Specifically, regarding patient's less desirable eating habits and patterns, we employed the technique of small changes when Eric [name] has not been able to fully commit to his prudent nutritional plan.  5. Obesity, current BMI 36.9  Course: Kane is currently in the action stage of change. As such, his goal is to continue with weight loss efforts.   Nutrition goals: He has agreed to the Category 4 Plan.   Exercise goals: For substantial health benefits, adults should do at least 150 minutes (2 hours and 30 minutes) a week of moderate-intensity, or 75 minutes (1 hour and 15 minutes) a week of vigorous-intensity aerobic physical activity, or an equivalent combination of moderate- and vigorous-intensity aerobic activity. Aerobic activity should be performed in episodes of at least 10 minutes, and preferably, it should be spread throughout the week.  Behavioral modification strategies: increasing lean protein intake, decreasing simple carbohydrates, increasing vegetables and increasing water intake.  Eric [name] has agreed to follow-up with our clinic in 2 weeks. He was informed of the importance of frequent follow-up visits to maximize his success with intensive lifestyle modifications for his multiple health conditions.   Objective:   Blood pressure 134/68, pulse 69, temperature 98.1 F (36.7 C), temperature source Oral, height 5\' 11"  (1.803 m), weight 264 lb (119.7 kg), SpO2 94 %. Body mass index is 36.82 kg/m.  General: Cooperative, alert, well developed, in no acute distress. HEENT: Conjunctivae and lids  unremarkable. Cardiovascular: Regular rhythm.  Lungs: Normal work of breathing. Neurologic: No focal deficits.   Attestation Statements:   Reviewed by clinician on day of visit: allergies, medications, problem list, medical history, surgical history, family history, social history, and previous encounter notes.  I, Insurance claims handler, CMA, am acting as transcriptionist for Helane Rima, DO  I have reviewed the above documentation for accuracy and completeness, and I agree with the above. Helane Rima, DO

## 2020-06-15 ENCOUNTER — Ambulatory Visit (INDEPENDENT_AMBULATORY_CARE_PROVIDER_SITE_OTHER): Payer: BLUE CROSS/BLUE SHIELD | Admitting: Family Medicine

## 2020-06-16 ENCOUNTER — Encounter (INDEPENDENT_AMBULATORY_CARE_PROVIDER_SITE_OTHER): Payer: Self-pay | Admitting: Bariatrics

## 2020-06-16 ENCOUNTER — Other Ambulatory Visit: Payer: Self-pay

## 2020-06-16 ENCOUNTER — Ambulatory Visit (INDEPENDENT_AMBULATORY_CARE_PROVIDER_SITE_OTHER): Payer: BLUE CROSS/BLUE SHIELD | Admitting: Bariatrics

## 2020-06-16 VITALS — BP 148/74 | HR 74 | Temp 97.6°F | Ht 71.0 in | Wt 259.0 lb

## 2020-06-16 DIAGNOSIS — E8881 Metabolic syndrome: Secondary | ICD-10-CM | POA: Diagnosis not present

## 2020-06-16 DIAGNOSIS — R7303 Prediabetes: Secondary | ICD-10-CM | POA: Diagnosis not present

## 2020-06-16 DIAGNOSIS — Z6839 Body mass index (BMI) 39.0-39.9, adult: Secondary | ICD-10-CM

## 2020-06-16 DIAGNOSIS — G4733 Obstructive sleep apnea (adult) (pediatric): Secondary | ICD-10-CM

## 2020-06-16 MED ORDER — OZEMPIC (1 MG/DOSE) 4 MG/3ML ~~LOC~~ SOPN
1.0000 mg | PEN_INJECTOR | SUBCUTANEOUS | 0 refills | Status: DC
Start: 2020-06-16 — End: 2020-07-13

## 2020-06-16 MED ORDER — OZEMPIC (1 MG/DOSE) 4 MG/3ML ~~LOC~~ SOPN: 1.0000 mg | PEN_INJECTOR | SUBCUTANEOUS | 0 refills | Status: DC

## 2020-06-22 NOTE — Progress Notes (Signed)
Chief Complaint:   OBESITY Eric [name] is here to discuss his progress with his obesity treatment plan along with follow-up of his obesity related diagnoses. Eric [name] is on the Category 4 Plan and states he is following his eating plan approximately 50-75% of the time. Eric [name] states he is walking and doing weights for 30-45 minutes 2-3 times per week.  Today's visit was #: 7 Starting weight: 285 lbs Starting date: 03/15/2020 Today's weight: 259 lbs Today's date: 06/16/2020 Total lbs lost to date: 26 Total lbs lost since last in-office visit: 5  Interim History: Eric [name] is down another 5 lbs.  Subjective:   1. Pre-diabetes Eric [name] is taking Ozempic.  2. Metabolic syndrome Eric [name] is taking Ozempic.  Assessment/Plan:   1. Pre-diabetes Eric [name] will continue to work on weight loss, exercise, and decreasing simple carbohydrates to help decrease the risk of diabetes. We will refill Ozempic for 1 month.  - Semaglutide, 1 MG/DOSE, (OZEMPIC, 1 MG/DOSE,) 4 MG/3ML SOPN; Inject 1 mg into the skin once a week.  Dispense: 3 mL; Refill: 0  2. Metabolic syndrome Eric [name] will continue Ozempic, decrease carbohydrates, and increase healthy fats and protein.  3. Obesity, current BMI 36 Eric [name] is currently in the action stage of change. As such, his goal is to continue with weight loss efforts. He has agreed to the Category 3 Plan or the Category 4 Plan.   Intentional eating was discussed. [employer] handout was provided today.  Exercise goals: As is.  Behavioral modification strategies: increasing lean protein intake, decreasing simple carbohydrates, increasing vegetables, increasing water intake, decreasing eating out, no skipping meals, meal planning and cooking strategies, keeping healthy foods in the home and avoiding temptations.  Eric [name] has agreed to follow-up with our clinic in 2 weeks with Dr. Earlene Plater. He was informed of the importance of frequent follow-up visits to maximize his  success with intensive lifestyle modifications for his multiple health conditions.   Objective:   Blood pressure (!) 148/74, pulse 74, temperature 97.6 F (36.4 C), height 5\' 11"  (1.803 m), weight 259 lb (117.5 kg), SpO2 95 %. Body mass index is 36.12 kg/m.  General: Cooperative, alert, well developed, in no acute distress. HEENT: Conjunctivae and lids unremarkable. Cardiovascular: Regular rhythm.  Lungs: Normal work of breathing. Neurologic: No focal deficits.   No results found for: CREATININE, BUN, NA, K, CL, CO2 No results found for: ALT, AST, GGT, ALKPHOS, BILITOT No results found for: HGBA1C No results found for: INSULIN No results found for: TSH No results found for: CHOL, HDL, LDLCALC, LDLDIRECT, TRIG, CHOLHDL No results found for: WBC, HGB, HCT, MCV, PLT No results found for: IRON, TIBC, FERRITIN  Obesity Behavioral Intervention:   Approximately 15 minutes were spent on the discussion below.  ASK: We discussed the diagnosis of obesity with today and Eric [name] agreed to give Eric [name] permission to discuss obesity behavioral modification therapy today.  ASSESS: Eric [name] has the diagnosis of obesity and his BMI today is 36.14. Eric [name] is in the action stage of change.   ADVISE: Eric [name] was educated on the multiple health risks of obesity as well as the benefit of weight loss to improve his health. He was advised of the need for long term treatment and the importance of lifestyle modifications to improve his current health and to decrease his risk of future health problems.  AGREE: Multiple dietary modification options and treatment options were discussed and Eric [name] agreed to follow the recommendations documented in the above note.  ARRANGE: Eric [name] was  educated on the importance of frequent visits to treat obesity as outlined per CMS and USPSTF guidelines and agreed to schedule his next follow up appointment today.  Attestation Statements:   Reviewed by clinician on  day of visit: allergies, medications, problem list, medical history, surgical history, family history, social history, and previous encounter notes.   Trude Mcburney, am acting as Energy manager for [employer], DO.  I have reviewed the above documentation for accuracy and completeness, and I agree with the above. Corinna Capra, DO

## 2020-06-23 ENCOUNTER — Encounter (INDEPENDENT_AMBULATORY_CARE_PROVIDER_SITE_OTHER): Payer: Self-pay | Admitting: Bariatrics

## 2020-06-28 ENCOUNTER — Ambulatory Visit (INDEPENDENT_AMBULATORY_CARE_PROVIDER_SITE_OTHER): Payer: BLUE CROSS/BLUE SHIELD | Admitting: Family Medicine

## 2020-06-28 ENCOUNTER — Other Ambulatory Visit: Payer: Self-pay

## 2020-06-28 ENCOUNTER — Encounter (INDEPENDENT_AMBULATORY_CARE_PROVIDER_SITE_OTHER): Payer: Self-pay | Admitting: Family Medicine

## 2020-06-28 VITALS — BP 132/72 | HR 71 | Temp 98.0°F | Ht 71.0 in | Wt 256.0 lb

## 2020-06-28 DIAGNOSIS — E65 Localized adiposity: Secondary | ICD-10-CM | POA: Diagnosis not present

## 2020-06-28 DIAGNOSIS — Z9189 Other specified personal risk factors, not elsewhere classified: Secondary | ICD-10-CM

## 2020-06-28 DIAGNOSIS — F331 Major depressive disorder, recurrent, moderate: Secondary | ICD-10-CM

## 2020-06-28 DIAGNOSIS — E8881 Metabolic syndrome: Secondary | ICD-10-CM | POA: Diagnosis not present

## 2020-06-28 DIAGNOSIS — E66812 Obesity, class 2: Secondary | ICD-10-CM

## 2020-06-28 DIAGNOSIS — Z6839 Body mass index (BMI) 39.0-39.9, adult: Secondary | ICD-10-CM

## 2020-07-01 ENCOUNTER — Other Ambulatory Visit: Payer: Self-pay

## 2020-07-01 ENCOUNTER — Telehealth: Payer: Self-pay | Admitting: Psychiatry

## 2020-07-01 ENCOUNTER — Other Ambulatory Visit: Payer: Self-pay | Admitting: Psychiatry

## 2020-07-01 DIAGNOSIS — F331 Major depressive disorder, recurrent, moderate: Secondary | ICD-10-CM

## 2020-07-01 DIAGNOSIS — F422 Mixed obsessional thoughts and acts: Secondary | ICD-10-CM

## 2020-07-01 MED ORDER — ALPRAZOLAM 0.25 MG PO TABS: 0.2500 mg | ORAL_TABLET | Freq: Two times a day (BID) | ORAL | 1 refills | Status: DC | PRN

## 2020-07-01 MED ORDER — METHYLPHENIDATE HCL 10 MG PO TABS: 30.0000 mg | ORAL_TABLET | Freq: Two times a day (BID) | ORAL | 0 refills | Status: DC

## 2020-07-01 MED ORDER — BUPROPION HCL ER (XL) 150 MG PO TB24
450.0000 mg | ORAL_TABLET | Freq: Every day | ORAL | 1 refills | Status: DC
Start: 2020-07-01 — End: 2021-01-30

## 2020-07-01 NOTE — Telephone Encounter (Signed)
Next appt is 07/07/20. Requesting refill on:  [facility], [address], [place], [place]. Phone # is [phone].

## 2020-07-01 NOTE — Telephone Encounter (Signed)
Sent!

## 2020-07-01 NOTE — Telephone Encounter (Signed)
Eric [name] called asking if the prescription could be sent in by noon.  I told him it would not happen by noon.  It would be taken care of today.  He is going out of town and needs to pick the mediation ASAP.

## 2020-07-01 NOTE — Telephone Encounter (Signed)
Pended for review

## 2020-07-07 ENCOUNTER — Ambulatory Visit: Payer: BLUE CROSS/BLUE SHIELD | Admitting: Psychiatry

## 2020-07-07 DIAGNOSIS — G4733 Obstructive sleep apnea (adult) (pediatric): Secondary | ICD-10-CM | POA: Diagnosis not present

## 2020-07-07 NOTE — Progress Notes (Signed)
Chief Complaint:   OBESITY Eric [name] is here to discuss his progress with his obesity treatment plan along with follow-up of his obesity related diagnoses.   Today's visit was #: 8 Starting weight: 285 lbs Starting date: 03/15/2020 Today's weight: 256 lbs Today's date: 06/28/2020 Weight change since last visit: 3 lbs Total lbs lost to date: 29 lbs Body mass index is 35.7 kg/m.  Total weight loss percentage to date: -10.18%  Interim History: Eric [name] denies polyphagia.  He is tolerating the 1 mg dose of Ozempic. Current Meal Plan: the Category 4 Plan for 25% of the time.  Current Exercise Plan: Walking/cardio/strength training for 30-45 minutes 2-3 times per week. Current Anti-Obesity Medications: Ozempic 1 mg subcutaneously weekly. Side effects: None.  Assessment/Plan:   1. Metabolic syndrome Starting goal: Lose 7-10% of starting weight. He will continue to focus on protein-rich, low simple carbohydrate foods. We reviewed the importance of hydration, regular exercise for stress reduction, and restorative sleep.  We will continue to check lab work every 3 months, with 10% weight loss, or should any other concerns arise.  He is taking Ozempic 1 mg subcutaneously weekly.  Plan:  Continue Ozempic at current dose.  2. Visceral obesity Current visceral fat rating: 22. Visceral fat rating should be < 13. Visceral adipose tissue is a hormonally active component of total body fat. This body composition phenotype is associated with medical disorders such as metabolic syndrome, cardiovascular disease and several malignancies including prostate, breast, and colorectal cancers. Starting goal: Lose 7-10% of starting weight.   3. Moderate episode of recurrent major depressive disorder (HCC) Eric [name] is taking Wellbutrin XL 450 mg daily and Lexapro 10 mg daily.  He is followed by [facility].   Plan:  Continue Wellbutrin and Lexapro.  Continue to follow with Behavioral Health.  4. At risk  for deficient intake of food Eric [name] was given extensive education and counseling today of more than 8 minutes on risks associated with deficient food intake.  Counseled him on the importance of following our prescribed meal plan and eating adequate amounts of protein.     5. Obesity, current BMI 35.8  Course: Eric [name] is currently in the action stage of change. As such, his goal is to continue with weight loss efforts.   Nutrition goals: He has agreed to the Category 4 Plan.   Exercise goals: For substantial health benefits, adults should do at least 150 minutes (2 hours and 30 minutes) a week of moderate-intensity, or 75 minutes (1 hour and 15 minutes) a week of vigorous-intensity aerobic physical activity, or an equivalent combination of moderate- and vigorous-intensity aerobic activity. Aerobic activity should be performed in episodes of at least 10 minutes, and preferably, it should be spread throughout the week.  Behavioral modification strategies: increasing lean protein intake, decreasing simple carbohydrates, increasing vegetables, and increasing water intake.  Eric [name] has agreed to follow-up with our clinic in 2-3 weeks. He was informed of the importance of frequent follow-up visits to maximize his success with intensive lifestyle modifications for his multiple health conditions.   Objective:   Blood pressure 132/72, pulse 71, temperature 98 F (36.7 C), temperature source Oral, height 5\' 11"  (1.803 m), weight 256 lb (116.1 kg), SpO2 95 %. Body mass index is 35.7 kg/m.  General: Cooperative, alert, well developed, in no acute distress. HEENT: Conjunctivae and lids unremarkable. Cardiovascular: Regular rhythm.  Lungs: Normal work of breathing. Neurologic: No focal deficits.   Attestation Statements:   Reviewed by clinician on day  of visit: allergies, medications, problem list, medical history, surgical history, family history, social history, and previous encounter notes.  I,  Insurance claims handler, CMA, am acting as transcriptionist for Helane Rima, DO  I have reviewed the above documentation for accuracy and completeness, and I agree with the above. Helane Rima, DO

## 2020-07-13 ENCOUNTER — Encounter (INDEPENDENT_AMBULATORY_CARE_PROVIDER_SITE_OTHER): Payer: Self-pay | Admitting: Family Medicine

## 2020-07-13 ENCOUNTER — Other Ambulatory Visit: Payer: Self-pay

## 2020-07-13 ENCOUNTER — Telehealth (INDEPENDENT_AMBULATORY_CARE_PROVIDER_SITE_OTHER): Payer: Self-pay

## 2020-07-13 ENCOUNTER — Ambulatory Visit (INDEPENDENT_AMBULATORY_CARE_PROVIDER_SITE_OTHER): Payer: BLUE CROSS/BLUE SHIELD | Admitting: Family Medicine

## 2020-07-13 VITALS — BP 126/69 | HR 67 | Temp 98.3°F | Ht 71.0 in | Wt 256.0 lb

## 2020-07-13 DIAGNOSIS — R7303 Prediabetes: Secondary | ICD-10-CM | POA: Diagnosis not present

## 2020-07-13 DIAGNOSIS — Z9189 Other specified personal risk factors, not elsewhere classified: Secondary | ICD-10-CM

## 2020-07-13 DIAGNOSIS — E65 Localized adiposity: Secondary | ICD-10-CM

## 2020-07-13 DIAGNOSIS — Z6839 Body mass index (BMI) 39.0-39.9, adult: Secondary | ICD-10-CM

## 2020-07-13 DIAGNOSIS — E8881 Metabolic syndrome: Secondary | ICD-10-CM | POA: Diagnosis not present

## 2020-07-13 MED ORDER — OZEMPIC (1 MG/DOSE) 4 MG/3ML ~~LOC~~ SOPN: 1.0000 mg | PEN_INJECTOR | SUBCUTANEOUS | 0 refills | Status: DC

## 2020-07-13 NOTE — Telephone Encounter (Signed)
Pt called in and stated that he is at the pharmacy and his meds are ready to pick up and that he left an hour ago from his appointment. I told him I will send a message to the nurse . The pt is requesting a refill on [facility] [address]. Please advise

## 2020-07-13 NOTE — Telephone Encounter (Signed)
E-Prescribing Status: Receipt confirmed by pharmacy (07/13/2020  2:28 PM EDT)

## 2020-07-20 NOTE — Progress Notes (Signed)
Chief Complaint:   OBESITY Eric [name] is here to discuss his progress with his obesity treatment plan along with follow-up of his obesity related diagnoses.   Today's visit was #: 9 Starting weight: 285 lbs Starting date: 03/15/2020 Today's weight: 256 lbs Today's date: 07/13/2020 Weight change since last visit: 0 Total lbs lost to date: 29 lbs Body mass index is 35.7 kg/m.  Total weight loss percentage to date: -10.18%  Interim History: Eric [name] says he is adjusting to Ozempic.  He traveled over the last couple of weeks and will be going to [place] soon.  We will discuss his exercise goal at his next visit.  Current Meal Plan: the Category 4 Plan for 0% of the time.  Current Exercise Plan: Walking for 30 minutes 2 times per week. Current Anti-Obesity Medications: Ozempic 1 mg subcutaneously weekly. Side effects: None.  Assessment/Plan:   1. Pre-diabetes Goal is HgbA1c < 5.7.  Medication: Ozempic 1 mg subcutaneously weekly.    Plan:  He will continue to focus on protein-rich, low simple carbohydrate foods. We reviewed the importance of hydration, regular exercise for stress reduction, and restorative sleep.   - Refill Semaglutide, 1 MG/DOSE, (OZEMPIC, 1 MG/DOSE,) 4 MG/3ML SOPN; Inject 1 mg into the skin once a week.  Dispense: 3 mL; Refill: 0  2. Metabolic syndrome Starting goal: Lose 7-10% of starting weight. He will continue to focus on protein-rich, low simple carbohydrate foods. We reviewed the importance of hydration, regular exercise for stress reduction, and restorative sleep.  We will continue to check lab work every 3 months, with 10% weight loss, or should any other concerns arise.  3. Visceral obesity Current visceral fat rating: 22. Visceral fat rating should be < 13. Visceral adipose tissue is a hormonally active component of total body fat. This body composition phenotype is associated with medical disorders such as metabolic syndrome, cardiovascular disease and  several malignancies including prostate, breast, and colorectal cancers. Starting goal: Lose 7-10% of starting weight.   4. At risk for heart disease Due to Eric [name]'s current state of health and medical condition(s), he is at a higher risk for heart disease.  This puts the patient at much greater risk to subsequently develop cardiopulmonary conditions that can significantly affect patient's quality of life in a negative manner.    At least 8 minutes were spent on counseling [name] about these concerns today. Evidence-based interventions for health behavior change were utilized today including the discussion of self monitoring techniques, problem-solving barriers, and SMART goal setting techniques.  Specifically, regarding patient's less desirable eating habits and patterns, we employed the technique of small changes when Eric [name] has not been able to fully commit to his prudent nutritional plan.  5. Obesity, current BMI 35.7  Course: Markes is currently in the action stage of change. As such, his goal is to continue with weight loss efforts.   Nutrition goals: He has agreed to practicing portion control and making smarter food choices, such as increasing vegetables and decreasing simple carbohydrates.   Exercise goals:  As is.  Behavioral modification strategies: increasing lean protein intake, decreasing simple carbohydrates, increasing vegetables, and increasing water intake.  Eric [name] has agreed to follow-up with our clinic in 3 weeks. He was informed of the importance of frequent follow-up visits to maximize his success with intensive lifestyle modifications for his multiple health conditions.   Objective:   Blood pressure 126/69, pulse 67, temperature 98.3 F (36.8 C), temperature source Oral, height 5\' 11"  (1.803 m), weight  256 lb (116.1 kg), SpO2 96 %. Body mass index is 35.7 kg/m.  General: Cooperative, alert, well developed, in no acute distress. HEENT: Conjunctivae and lids  unremarkable. Cardiovascular: Regular rhythm.  Lungs: Normal work of breathing. Neurologic: No focal deficits.   Attestation Statements:   Reviewed by clinician on day of visit: allergies, medications, problem list, medical history, surgical history, family history, social history, and previous encounter notes.  I, Insurance claims handler, CMA, am acting as transcriptionist for Helane Rima, DO  I have reviewed the above documentation for accuracy and completeness, and I agree with the above. Helane Rima, DO

## 2020-07-21 ENCOUNTER — Other Ambulatory Visit: Payer: Self-pay | Admitting: Psychiatry

## 2020-07-21 DIAGNOSIS — G2581 Restless legs syndrome: Secondary | ICD-10-CM

## 2020-07-22 NOTE — Telephone Encounter (Signed)
Do you want him back on this?

## 2020-07-27 ENCOUNTER — Other Ambulatory Visit: Payer: Self-pay

## 2020-07-27 ENCOUNTER — Encounter (INDEPENDENT_AMBULATORY_CARE_PROVIDER_SITE_OTHER): Payer: Self-pay | Admitting: Family Medicine

## 2020-07-27 ENCOUNTER — Ambulatory Visit (INDEPENDENT_AMBULATORY_CARE_PROVIDER_SITE_OTHER): Payer: Medicare Other | Admitting: Family Medicine

## 2020-07-27 VITALS — BP 132/68 | HR 70 | Temp 97.8°F | Ht 71.0 in | Wt 251.0 lb

## 2020-07-27 DIAGNOSIS — R7303 Prediabetes: Secondary | ICD-10-CM

## 2020-07-27 DIAGNOSIS — E8881 Metabolic syndrome: Secondary | ICD-10-CM

## 2020-07-27 DIAGNOSIS — E65 Localized adiposity: Secondary | ICD-10-CM

## 2020-07-27 DIAGNOSIS — Z6839 Body mass index (BMI) 39.0-39.9, adult: Secondary | ICD-10-CM

## 2020-08-03 ENCOUNTER — Other Ambulatory Visit: Payer: Self-pay

## 2020-08-03 ENCOUNTER — Ambulatory Visit (INDEPENDENT_AMBULATORY_CARE_PROVIDER_SITE_OTHER): Payer: Medicare Other | Admitting: Psychiatry

## 2020-08-03 ENCOUNTER — Encounter: Payer: Self-pay | Admitting: Psychiatry

## 2020-08-03 DIAGNOSIS — F3342 Major depressive disorder, recurrent, in full remission: Secondary | ICD-10-CM | POA: Diagnosis not present

## 2020-08-03 DIAGNOSIS — G2581 Restless legs syndrome: Secondary | ICD-10-CM

## 2020-08-03 DIAGNOSIS — G4733 Obstructive sleep apnea (adult) (pediatric): Secondary | ICD-10-CM | POA: Diagnosis not present

## 2020-08-03 DIAGNOSIS — F5221 Male erectile disorder: Secondary | ICD-10-CM

## 2020-08-03 DIAGNOSIS — F422 Mixed obsessional thoughts and acts: Secondary | ICD-10-CM

## 2020-08-03 DIAGNOSIS — N529 Male erectile dysfunction, unspecified: Secondary | ICD-10-CM

## 2020-08-03 NOTE — Progress Notes (Signed)
Chief Complaint:   OBESITY Eric [name] is here to discuss his progress with his obesity treatment plan along with follow-up of his obesity related diagnoses.   Today's visit was #: 10 Starting weight: 285 lbs Starting date: 03/15/2020 Today's weight: 251 lbs Today's date: 07/27/2020 Weight change since last visit: 5 lbs Total lbs lost to date: 34 lbs Body mass index is 35.01 kg/m.  Total weight loss percentage to date: -11.93%  Interim History:  Eric [name] says he enjoyed his time in [place].  He is tolerating his medications. Current Meal Plan: practicing portion control and making smarter food choices, such as increasing vegetables and decreasing simple carbohydrates for 50% of the time.  Current Exercise Plan: Cardio/strength training for 30 minutes 2 times per week. Current Anti-Obesity Medications: Ozempic 1 mg subcutaneously weekly. Side effects: None.  Assessment/Plan:   1. Pre-diabetes Goal is HgbA1c < 5.7.  Medication: Ozempic 1 mg subcutaneously weekly.    Plan:  Continue medication.  He will continue to focus on protein-rich, low simple carbohydrate foods. We reviewed the importance of hydration, regular exercise for stress reduction, and restorative sleep.   2. Metabolic syndrome Starting goal: Lose 7-10% of starting weight. He will continue to focus on protein-rich, low simple carbohydrate foods. We reviewed the importance of hydration, regular exercise for stress reduction, and restorative sleep.  We will continue to check lab work every 3 months, with 10% weight loss, or should any other concerns arise.  3. Visceral obesity Current visceral fat rating: 22. Visceral fat rating should be < 13. Visceral adipose tissue is a hormonally active component of total body fat. This body composition phenotype is associated with medical disorders such as metabolic syndrome, cardiovascular disease and several malignancies including prostate, breast, and colorectal cancers. Starting  goal: Lose 7-10% of starting weight.   4. Obesity, current BMI 35.1  Course: Eric [name] is currently in the action stage of change. As such, his goal is to continue with weight loss efforts.   Nutrition goals: He has agreed to practicing portion control and making smarter food choices, such as increasing vegetables and decreasing simple carbohydrates.   Exercise goals:  As is.  Behavioral modification strategies: increasing lean protein intake, decreasing simple carbohydrates, and increasing vegetables.  Eric [name] has agreed to follow-up with our clinic in 2 weeks. He was informed of the importance of frequent follow-up visits to maximize his success with intensive lifestyle modifications for his multiple health conditions.   Objective:   Blood pressure 132/68, pulse 70, temperature 97.8 F (36.6 C), temperature source Oral, height 5\' 11"  (1.803 m), weight 251 lb (113.9 kg), SpO2 95 %. Body mass index is 35.01 kg/m.  General: Cooperative, alert, well developed, in no acute distress. HEENT: Conjunctivae and lids unremarkable. Cardiovascular: Regular rhythm.  Lungs: Normal work of breathing. Neurologic: No focal deficits.   Obesity Behavioral Intervention:   Approximately 15 minutes were spent on the discussion below.  ASK: We discussed the diagnosis of obesity with today and Eric [name] agreed to give Eric [name] permission to discuss obesity behavioral modification therapy today.  ASSESS: Eric [name] has the diagnosis of obesity and his BMI today is 35.1. Eric [name] is in the action stage of change.   ADVISE: Eric [name] was educated on the multiple health risks of obesity as well as the benefit of weight loss to improve his health. He was advised of the need for long term treatment and the importance of lifestyle modifications to improve his current health and to decrease his risk  of future health problems.  AGREE: Multiple dietary modification options and treatment options were discussed and  Eric [name] agreed to follow the recommendations documented in the above note.  ARRANGE: Eric [name] was educated on the importance of frequent visits to treat obesity as outlined per CMS and USPSTF guidelines and agreed to schedule his next follow up appointment today.  Attestation Statements:   Reviewed by clinician on day of visit: allergies, medications, problem list, medical history, surgical history, family history, social history, and previous encounter notes.  I, Insurance claims handler, CMA, am acting as transcriptionist for Helane Rima, DO  I have reviewed the above documentation for accuracy and completeness, and I agree with the above. Helane Rima, DO
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
## 2020-08-04 ENCOUNTER — Ambulatory Visit: Payer: BLUE CROSS/BLUE SHIELD | Admitting: Psychiatry

## 2020-08-10 ENCOUNTER — Encounter (INDEPENDENT_AMBULATORY_CARE_PROVIDER_SITE_OTHER): Payer: Self-pay | Admitting: Family Medicine

## 2020-08-10 ENCOUNTER — Other Ambulatory Visit: Payer: Self-pay

## 2020-08-10 ENCOUNTER — Ambulatory Visit (INDEPENDENT_AMBULATORY_CARE_PROVIDER_SITE_OTHER): Payer: Medicare Other | Admitting: Family Medicine

## 2020-08-10 VITALS — BP 121/68 | HR 77 | Temp 98.2°F | Ht 71.0 in | Wt 253.0 lb

## 2020-08-10 DIAGNOSIS — R7301 Impaired fasting glucose: Secondary | ICD-10-CM | POA: Diagnosis not present

## 2020-08-10 DIAGNOSIS — D3131 Benign neoplasm of right choroid: Secondary | ICD-10-CM | POA: Diagnosis not present

## 2020-08-10 DIAGNOSIS — E7849 Other hyperlipidemia: Secondary | ICD-10-CM

## 2020-08-10 DIAGNOSIS — E8881 Metabolic syndrome: Secondary | ICD-10-CM

## 2020-08-10 DIAGNOSIS — H2513 Age-related nuclear cataract, bilateral: Secondary | ICD-10-CM | POA: Diagnosis not present

## 2020-08-10 DIAGNOSIS — H5213 Myopia, bilateral: Secondary | ICD-10-CM | POA: Diagnosis not present

## 2020-08-10 DIAGNOSIS — Z6839 Body mass index (BMI) 39.0-39.9, adult: Secondary | ICD-10-CM | POA: Diagnosis not present

## 2020-08-10 DIAGNOSIS — F3289 Other specified depressive episodes: Secondary | ICD-10-CM

## 2020-08-10 DIAGNOSIS — H52203 Unspecified astigmatism, bilateral: Secondary | ICD-10-CM | POA: Diagnosis not present

## 2020-08-11 DIAGNOSIS — G4733 Obstructive sleep apnea (adult) (pediatric): Secondary | ICD-10-CM | POA: Diagnosis not present

## 2020-08-11 MED ORDER — MOUNJARO 5 MG/0.5ML ~~LOC~~ SOAJ
5.0000 mg | SUBCUTANEOUS | 0 refills | Status: DC
Start: 2020-08-11 — End: 2020-08-25

## 2020-08-17 ENCOUNTER — Ambulatory Visit (INDEPENDENT_AMBULATORY_CARE_PROVIDER_SITE_OTHER): Payer: BLUE CROSS/BLUE SHIELD | Admitting: Family Medicine

## 2020-08-20 NOTE — Progress Notes (Signed)
Chief Complaint:   OBESITY Eric [name] is here to discuss his progress with his obesity treatment plan along with follow-up of his obesity related diagnoses.   Today's visit was #: 11 Starting weight: 285 lbs Starting date: 03/15/2020 Today's weight: 253 lbs Today's date: 08/10/2020 Weight change since last visit: +2 lbs Total lbs lost to date: 32 lbs Body mass index is 35.29 kg/m.  Total weight loss percentage to date: -11.23%  Interim History:  Eric [name] admits to eating more ice cream.  He says he has been craving sweets.  Endorses polyphagia. Current Meal Plan: the Category 3 Plan and the Category 4 Plan for 20% of the time.  Current Exercise Plan: Walking/strength training/pilates for 30 minutes 2 times per week. Current Anti-Obesity Medications: Ozempic 1 mg subcutaneously weekly. Side effects: None.  Assessment/Plan:   Meds ordered this encounter  Medications   tirzepatide (MOUNJARO) 5 MG/0.5ML Pen    Sig: Inject 5 mg into the skin once a week.    Dispense:  2 mL    Refill:  0    1. Impaired fasting glucose Eric [name] will stop Ozempic and start Mounjaro 5 mg subcutaneously weekly.  - Start tirzepatide Eric [name]) 5 MG/0.5ML Pen; Inject 5 mg into the skin once a week.  Dispense: 2 mL; Refill: 0  2. Other hyperlipidemia Lipid-lowering medications: Zocor 20 mg daily.   Plan: Dietary changes: Increase soluble fiber, decrease simple carbohydrates, decrease saturated fat. Exercise changes: Moderate to vigorous-intensity aerobic activity 150 minutes per week or as tolerated. We will continue to monitor along with PCP/specialists as it pertains to his weight loss journey.  3. Metabolic syndrome Starting goal: Lose 7-10% of starting weight. He will continue to focus on protein-rich, low simple carbohydrate foods. We reviewed the importance of hydration, regular exercise for stress reduction, and restorative sleep.  We will continue to check lab work every 3 months, with 10% weight  loss, or should any other concerns arise.  Plan:  Stop Ozempic and start Mounjary 5 mg subcutaneously weekly, as per below.  - Start tirzepatide Eric [name]) 5 MG/0.5ML Pen; Inject 5 mg into the skin once a week.  Dispense: 2 mL; Refill: 0  4. Other depression, with emotional eating Not at goal. Medication: Wellbutrin XL 450 mg daily, Lexapro 10 mg daily.  Plan:  Discussed cues and consequences, how thoughts affect eating, model of thoughts, feelings, and behaviors, and strategies for change by focusing on the cue. Discussed cognitive distortions, coping thoughts, and how to change your thoughts.  5. Obesity, current BMI 35.4  Course: Eric [name] is currently in the action stage of change. As such, his goal is to continue with weight loss efforts.   Nutrition goals: He has agreed to practicing portion control and making smarter food choices, such as increasing vegetables and decreasing simple carbohydrates.   Exercise goals:  As is.  Behavioral modification strategies: increasing lean protein intake, decreasing simple carbohydrates, increasing vegetables, increasing water intake, and emotional eating strategies.  Eric [name] has agreed to follow-up with our clinic in 3-4 weeks. He was informed of the importance of frequent follow-up visits to maximize his success with intensive lifestyle modifications for his multiple health conditions.   Objective:   Blood pressure 121/68, pulse 77, temperature 98.2 F (36.8 C), temperature source Oral, height 5\' 11"  (1.803 m), weight 253 lb (114.8 kg), SpO2 97 %. Body mass index is 35.29 kg/m.  General: Cooperative, alert, well developed, in no acute distress. HEENT: Conjunctivae and lids unremarkable. Cardiovascular: Regular rhythm.  Lungs: Normal work of breathing. Neurologic: No focal deficits.   Obesity Behavioral Intervention:   Approximately 15 minutes were spent on the discussion below.  ASK: We discussed the diagnosis of obesity with Eric [name]  today and Eric [name] agreed to give [place] permission to discuss obesity behavioral modification therapy today.  ASSESS: Eric [name] has the diagnosis of obesity and his BMI today is 35.4. Eric [name] is in the action stage of change.   ADVISE: Eric [name] was educated on the multiple health risks of obesity as well as the benefit of weight loss to improve his health. He was advised of the need for long term treatment and the importance of lifestyle modifications to improve his current health and to decrease his risk of future health problems.  AGREE: Multiple dietary modification options and treatment options were discussed and Eric [name] agreed to follow the recommendations documented in the above note.  ARRANGE: Eric [name] was educated on the importance of frequent visits to treat obesity as outlined per CMS and USPSTF guidelines and agreed to schedule his next follow up appointment today.  Attestation Statements:   Reviewed by clinician on day of visit: allergies, medications, problem list, medical history, surgical history, family history, social history, and previous encounter notes.  I, Insurance claims handler, CMA, am acting as transcriptionist for Helane Rima, DO  I have reviewed the above documentation for accuracy and completeness, and I agree with the above. Helane Rima, DO

## 2020-08-25 ENCOUNTER — Other Ambulatory Visit: Payer: Self-pay

## 2020-08-25 ENCOUNTER — Ambulatory Visit (INDEPENDENT_AMBULATORY_CARE_PROVIDER_SITE_OTHER): Payer: Medicare Other | Admitting: Family Medicine

## 2020-08-25 ENCOUNTER — Encounter (INDEPENDENT_AMBULATORY_CARE_PROVIDER_SITE_OTHER): Payer: Self-pay | Admitting: Family Medicine

## 2020-08-25 VITALS — BP 136/79 | HR 69 | Temp 98.3°F | Ht 71.0 in | Wt 249.0 lb

## 2020-08-25 DIAGNOSIS — R7301 Impaired fasting glucose: Secondary | ICD-10-CM | POA: Diagnosis not present

## 2020-08-25 DIAGNOSIS — Z6839 Body mass index (BMI) 39.0-39.9, adult: Secondary | ICD-10-CM | POA: Diagnosis not present

## 2020-08-25 DIAGNOSIS — E8881 Metabolic syndrome: Secondary | ICD-10-CM

## 2020-08-25 DIAGNOSIS — E65 Localized adiposity: Secondary | ICD-10-CM

## 2020-08-25 MED ORDER — TIRZEPATIDE 10 MG/0.5ML ~~LOC~~ SOAJ: 10.0000 mg | SUBCUTANEOUS | 0 refills | Status: DC

## 2020-08-30 ENCOUNTER — Encounter (INDEPENDENT_AMBULATORY_CARE_PROVIDER_SITE_OTHER): Payer: Self-pay | Admitting: Family Medicine

## 2020-08-30 NOTE — Progress Notes (Signed)
Chief Complaint:   OBESITY Eric [name] is here to discuss his progress with his obesity treatment plan along with follow-up of his obesity related diagnoses.   Today's visit was #: 12 Starting weight: 285 lbs Starting date: 03/15/2020 Today's weight: 249 lbs Today's date: 08/25/2020 Weight change since last visit: 4 lbs Total lbs lost to date: 36 lbs Body mass index is 34.73 kg/m.  Total weight loss percentage to date: -12.63%  Interim History:  Eric [name] says he would like to restart a category diet but work in ice cream. Current Meal Plan: practicing portion control and making smarter food choices, such as increasing vegetables and decreasing simple carbohydrates for 0% of the time.  Current Exercise Plan: Walking/strength training for 30-45 minutes 3 times per week. Current Anti-Obesity Medications: Mounjaro 5 mg subcutaneously weekly. Side effects: None.  Assessment/Plan:   1. Impaired fasting glucose, with polyphagia Improving. Increase Mounjaro to 10 mg subcutaneously weekly, as per below.  - Increase and refill tirzepatide (MOUNJARO) 10 MG/0.5ML Pen; Inject 10 mg into the skin once a week.  Dispense: 6 mL; Refill: 0  2. Metabolic syndrome Starting goal MET: Lose 7-10% of starting weight. He will continue to focus on protein-rich, low simple carbohydrate foods. We reviewed the importance of hydration, regular exercise for stress reduction, and restorative sleep.  We will continue to check lab work every 3 months, with 10% weight loss, or should any other concerns arise.  3. Visceral obesity Current visceral fat rating: 22. Visceral fat rating should be < 13. Visceral adipose tissue is a hormonally active component of total body fat. This body composition phenotype is associated with medical disorders such as metabolic syndrome, cardiovascular disease and several malignancies including prostate, breast, and colorectal cancers. Starting goal: Lose 7-10% of starting weight.   4.  Obesity, current BMI 34.8  Course: Eric [name] is currently in the action stage of change. As such, his goal is to continue with weight loss efforts.   Nutrition goals: He has agreed to the Category 2 Plan +500 calories.   Exercise goals:  As is.  Behavioral modification strategies: increasing lean protein intake, decreasing simple carbohydrates, increasing vegetables, and increasing water intake.  Eric [name] has agreed to follow-up with our clinic in 3 weeks. He was informed of the importance of frequent follow-up visits to maximize his success with intensive lifestyle modifications for his multiple health conditions.   Objective:   Blood pressure 136/79, pulse 69, temperature 98.3 F (36.8 C), temperature source Oral, height 5' 11" (1.803 m), weight 249 lb (112.9 kg), SpO2 97 %. Body mass index is 34.73 kg/m.  General: Cooperative, alert, well developed, in no acute distress. HEENT: Conjunctivae and lids unremarkable. Cardiovascular: Regular rhythm.  Lungs: Normal work of breathing. Neurologic: No focal deficits.   Obesity Behavioral Intervention:   Approximately 15 minutes were spent on the discussion below.  ASK: We discussed the diagnosis of obesity with Eric [name] today and Eric [name] agreed to give [place] permission to discuss obesity behavioral modification therapy today.  ASSESS: Eric [name] has the diagnosis of obesity and his BMI today is 34.8. Eric [name] is in the action stage of change.   ADVISE: Eric [name] was educated on the multiple health risks of obesity as well as the benefit of weight loss to improve his health. He was advised of the need for long term treatment and the importance of lifestyle modifications to improve his current health and to decrease his risk of future health problems.  AGREE: Multiple dietary modification options and  treatment options were discussed and Eric [name] agreed to follow the recommendations documented in the above note.  ARRANGE: Eric [name] was educated on  the importance of frequent visits to treat obesity as outlined per CMS and USPSTF guidelines and agreed to schedule his next follow up appointment today.  Attestation Statements:   Reviewed by clinician on day of visit: allergies, medications, problem list, medical history, surgical history, family history, social history, and previous encounter notes.  I, Water quality scientist, CMA, am acting as transcriptionist for Briscoe Deutscher, DO  I have reviewed the above documentation for accuracy and completeness, and I agree with the above. Briscoe Deutscher, DO

## 2020-08-31 NOTE — Telephone Encounter (Signed)
Prior authorization has been started for Mounjaro. Will notify patient and provider once response is received.  

## 2020-09-05 ENCOUNTER — Telehealth: Payer: Self-pay | Admitting: Psychiatry

## 2020-09-05 ENCOUNTER — Encounter (INDEPENDENT_AMBULATORY_CARE_PROVIDER_SITE_OTHER): Payer: Self-pay

## 2020-09-05 ENCOUNTER — Other Ambulatory Visit: Payer: Self-pay

## 2020-09-05 DIAGNOSIS — F331 Major depressive disorder, recurrent, moderate: Secondary | ICD-10-CM

## 2020-09-05 MED ORDER — METHYLPHENIDATE HCL 10 MG PO TABS: 30.0000 mg | ORAL_TABLET | Freq: Two times a day (BID) | ORAL | 0 refills | Status: DC

## 2020-09-05 NOTE — Telephone Encounter (Signed)
Pended.

## 2020-09-05 NOTE — Telephone Encounter (Signed)
Please RF Ritilan for pt. Next appt 8/18. Please send to : [id number] - [place], [place] - [address]

## 2020-09-06 DIAGNOSIS — M6208 Separation of muscle (nontraumatic), other site: Secondary | ICD-10-CM | POA: Diagnosis not present

## 2020-09-06 DIAGNOSIS — E7800 Pure hypercholesterolemia, unspecified: Secondary | ICD-10-CM | POA: Diagnosis not present

## 2020-09-06 DIAGNOSIS — Z79899 Other long term (current) drug therapy: Secondary | ICD-10-CM | POA: Diagnosis not present

## 2020-09-06 DIAGNOSIS — R7309 Other abnormal glucose: Secondary | ICD-10-CM | POA: Diagnosis not present

## 2020-09-06 NOTE — Telephone Encounter (Signed)
Pt last seen by Dr. Wallace.  

## 2020-09-08 ENCOUNTER — Ambulatory Visit (INDEPENDENT_AMBULATORY_CARE_PROVIDER_SITE_OTHER): Payer: Medicare Other | Admitting: Family Medicine

## 2020-09-08 ENCOUNTER — Other Ambulatory Visit (INDEPENDENT_AMBULATORY_CARE_PROVIDER_SITE_OTHER): Payer: Self-pay | Admitting: Family Medicine

## 2020-09-08 ENCOUNTER — Encounter: Payer: Self-pay | Admitting: Psychiatry

## 2020-09-08 ENCOUNTER — Other Ambulatory Visit: Payer: Self-pay

## 2020-09-08 ENCOUNTER — Ambulatory Visit: Payer: BLUE CROSS/BLUE SHIELD | Admitting: Psychiatry

## 2020-09-08 ENCOUNTER — Encounter (INDEPENDENT_AMBULATORY_CARE_PROVIDER_SITE_OTHER): Payer: Self-pay | Admitting: Family Medicine

## 2020-09-08 ENCOUNTER — Ambulatory Visit (INDEPENDENT_AMBULATORY_CARE_PROVIDER_SITE_OTHER): Payer: Medicare Other | Admitting: Psychiatry

## 2020-09-08 VITALS — BP 125/71 | HR 64

## 2020-09-08 VITALS — BP 113/64 | HR 62 | Temp 98.1°F | Ht 71.0 in | Wt 248.0 lb

## 2020-09-08 DIAGNOSIS — G2581 Restless legs syndrome: Secondary | ICD-10-CM

## 2020-09-08 DIAGNOSIS — G4733 Obstructive sleep apnea (adult) (pediatric): Secondary | ICD-10-CM | POA: Diagnosis not present

## 2020-09-08 DIAGNOSIS — R7989 Other specified abnormal findings of blood chemistry: Secondary | ICD-10-CM

## 2020-09-08 DIAGNOSIS — F422 Mixed obsessional thoughts and acts: Secondary | ICD-10-CM

## 2020-09-08 DIAGNOSIS — F3289 Other specified depressive episodes: Secondary | ICD-10-CM

## 2020-09-08 DIAGNOSIS — R7301 Impaired fasting glucose: Secondary | ICD-10-CM | POA: Diagnosis not present

## 2020-09-08 DIAGNOSIS — Z6839 Body mass index (BMI) 39.0-39.9, adult: Secondary | ICD-10-CM

## 2020-09-08 DIAGNOSIS — F5221 Male erectile disorder: Secondary | ICD-10-CM

## 2020-09-08 DIAGNOSIS — F331 Major depressive disorder, recurrent, moderate: Secondary | ICD-10-CM

## 2020-09-08 MED ORDER — SEMAGLUTIDE (2 MG/DOSE) 8 MG/3ML ~~LOC~~ SOPN
2.0000 mg | PEN_INJECTOR | SUBCUTANEOUS | 0 refills | Status: DC
Start: 2020-09-08 — End: 2020-09-22

## 2020-09-08 MED ORDER — TIRZEPATIDE 15 MG/0.5ML ~~LOC~~ SOAJ: 15.0000 mg | SUBCUTANEOUS | 0 refills | Status: DC
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
## 2020-09-12 NOTE — Telephone Encounter (Signed)
Dr.Wallace °

## 2020-09-13 NOTE — Progress Notes (Signed)
Chief Complaint:   OBESITY Eric [name] is here to discuss his progress with his obesity treatment plan along with follow-up of his obesity related diagnoses.   Today's visit was #: 13 Starting weight: 285 lbs Starting date: 03/15/2020 Today's weight: 248 lbs Today's date: 09/08/2020 Weight change since last visit: 1 lb Total lbs lost to date: 37 lbs Body mass index is 34.59 kg/m.  Total weight loss percentage to date: -12.98%  Current Meal Plan: the Category 2 Plan +500 calories for 0% of the time.  Current Exercise Plan: Walking/pilates/cardio for 30-45 minutes 3-4 times per week. Current Anti-Obesity Medications: Mounjaro 10 mg subcutaneously weekly. Side effects: None.  Interim History:  Eric [name] has Mounjaro 10 mg and is tolerating it well, but will likely not be able to obtain another prescription.  Assessment/Plan:   1. Impaired fasting glucose Increase to Mounjaro 15 mg subcutaneously weekly.  If not covered, will send in Ozempic 2 mg subcutaneously weekly.  - Increase tirzepatide (MOUNJARO) 15 MG/0.5ML Pen; Inject 15 mg into the skin once a week.  Dispense: 6 mL; Refill: 0 - Start (if Mounjaro not covered/available) Semaglutide, 2 MG/DOSE, 8 MG/3ML SOPN; Inject 2 mg as directed once a week.  Dispense: 9 mL; Refill: 0  UPDATE: PHARMACY MESSAGE REQUESTING MORE INFO. Staff clarifying and will call patient with options.   2. Other depression, with emotional eating Controlled. Medication: Wellbutrin XL 150 mg daily, Lexapro 20 mg daily.  Plan:  Continue medications.  Discussed cues and consequences, how thoughts affect eating, model of thoughts, feelings, and behaviors, and strategies for change by focusing on the cue. Discussed cognitive distortions, coping thoughts, and how to change your thoughts.  3. Obesity, current BMI 34.7  Course: Eric [name] is currently in the action stage of change. As such, his goal is to continue with weight loss efforts.   Nutrition goals: He  has agreed to the Category 2 Plan.   Exercise goals:  As is.  Behavioral modification strategies: increasing lean protein intake, decreasing simple carbohydrates, increasing vegetables, and emotional eating strategies.  [name] has agreed to follow-up with our clinic in 2 weeks. He was informed of the importance of frequent follow-up visits to maximize his success with intensive lifestyle modifications for his multiple health conditions.   Objective:   Blood pressure 113/64, pulse 62, temperature 98.1 F (36.7 C), temperature source Oral, height 5\' 11"  (1.803 m), weight 248 lb (112.5 kg), SpO2 95 %. Body mass index is 34.59 kg/m.  General: Cooperative, alert, well developed, in no acute distress. HEENT: Conjunctivae and lids unremarkable. Cardiovascular: Regular rhythm.  Lungs: Normal work of breathing. Neurologic: No focal deficits.   Obesity Behavioral Intervention:   Approximately 15 minutes were spent on the discussion below.  ASK: We discussed the diagnosis of obesity with today and Jac agreed to give [name] permission to discuss obesity behavioral modification therapy today.  ASSESS: Eric [name] has the diagnosis of obesity and his BMI today is 34.7. [name] is in the action stage of change.   ADVISE: Vega was educated on the multiple health risks of obesity as well as the benefit of weight loss to improve his health. He was advised of the need for long term treatment and the importance of lifestyle modifications to improve his current health and to decrease his risk of future health problems.  AGREE: Multiple dietary modification options and treatment options were discussed and Eric [name] agreed to follow the recommendations documented in the above note.  ARRANGE: Eric [name] was educated  on the importance of frequent visits to treat obesity as outlined per CMS and USPSTF guidelines and agreed to schedule his next follow up appointment today.  Attestation Statements:    Reviewed by clinician on day of visit: allergies, medications, problem list, medical history, surgical history, family history, social history, and previous encounter notes.  I, Insurance claims handler, CMA, am acting as transcriptionist for Helane Rima, DO  I have reviewed the above documentation for accuracy and completeness, and I agree with the above. Helane Rima, DO

## 2020-09-14 ENCOUNTER — Encounter (INDEPENDENT_AMBULATORY_CARE_PROVIDER_SITE_OTHER): Payer: Self-pay

## 2020-09-14 ENCOUNTER — Ambulatory Visit (INDEPENDENT_AMBULATORY_CARE_PROVIDER_SITE_OTHER): Payer: Medicare Other | Admitting: Family Medicine

## 2020-09-14 NOTE — Telephone Encounter (Signed)
Msg sent to pt 

## 2020-09-15 ENCOUNTER — Encounter (INDEPENDENT_AMBULATORY_CARE_PROVIDER_SITE_OTHER): Payer: Self-pay | Admitting: Family Medicine

## 2020-09-22 ENCOUNTER — Ambulatory Visit (INDEPENDENT_AMBULATORY_CARE_PROVIDER_SITE_OTHER): Payer: Medicare Other | Admitting: Family Medicine

## 2020-09-22 ENCOUNTER — Encounter (INDEPENDENT_AMBULATORY_CARE_PROVIDER_SITE_OTHER): Payer: Self-pay | Admitting: Family Medicine

## 2020-09-22 ENCOUNTER — Other Ambulatory Visit: Payer: Self-pay

## 2020-09-22 VITALS — BP 124/68 | HR 68 | Temp 97.8°F | Ht 71.0 in | Wt 248.0 lb

## 2020-09-22 DIAGNOSIS — F3289 Other specified depressive episodes: Secondary | ICD-10-CM | POA: Diagnosis not present

## 2020-09-22 DIAGNOSIS — R7301 Impaired fasting glucose: Secondary | ICD-10-CM

## 2020-09-22 DIAGNOSIS — Z6839 Body mass index (BMI) 39.0-39.9, adult: Secondary | ICD-10-CM

## 2020-09-22 DIAGNOSIS — E8881 Metabolic syndrome: Secondary | ICD-10-CM

## 2020-09-22 MED ORDER — TIRZEPATIDE 12.5 MG/0.5ML ~~LOC~~ SOAJ: 12.5000 mg | SUBCUTANEOUS | 0 refills | Status: DC

## 2020-09-27 ENCOUNTER — Telehealth (INDEPENDENT_AMBULATORY_CARE_PROVIDER_SITE_OTHER): Payer: Self-pay

## 2020-09-27 NOTE — Progress Notes (Signed)
Office: [phone]  /  Fax: [phone]    Date: October 10, 2020   Appointment Start Time: 11:59am Duration: 70 minutes Provider: Glennie Isle, Psy.D. Type of Session: Intake for Individual Therapy  Location of Patient: Home (private room) Location of Provider: Provider's home (private office) Type of Contact: Telepsychological Visit via MyChart Video Visit  Informed Consent: This provider called Eric [name] at 11:57am, as this provider noticed he was attempting to join today's MyChart Video Visit. He did not answer, but was observed joining shortly after.   Prior to proceeding with today's appointment, two pieces of identifying information were obtained. In addition, Eric [name]'s physical location at the time of this appointment was obtained as well a phone number he could be reached at in the event of technical difficulties. Eric [name] and this provider participated in today's telepsychological service.   The provider's role was explained to [employer]. The provider reviewed and discussed issues of confidentiality, privacy, and limits therein (e.g., reporting obligations). In addition to verbal informed consent, written informed consent for psychological services was obtained prior to the initial appointment. Since the clinic is not a 24/7 crisis center, mental health emergency resources were shared and this  provider explained MyChart, e-mail, voicemail, and/or other messaging systems should be utilized only for non-emergency reasons. This provider also explained that information obtained during appointments will be placed in Huxton's medical record and relevant information will be shared with other providers at [facility] for coordination of care. [name] agreed information may be shared with other [facility] providers as needed for coordination of care and by signing the service agreement document, he provided written consent for coordination of care.  Prior to initiating telepsychological services, Eric [name] completed an informed consent document, which included the development of a safety plan (i.e., an emergency contact and emergency resources) in the event of an emergency/crisis. [name] verbally acknowledged understanding he is ultimately responsible for understanding his insurance benefits for telepsychological and in-person services. This provider also reviewed confidentiality, as it relates to telepsychological services, as well as the rationale for telepsychological services (i.e., to reduce exposure risk to COVID-19). [name]  acknowledged understanding that appointments cannot be recorded without both party consent and he is aware he is responsible for securing confidentiality on his end of the session. [name] verbally consented to proceed.  Chief Complaint/HPI: Eric [name] was referred by Dr. Briscoe Deutscher due to other depression, with emotional eating. Per the note for the visit with Dr. Briscoe Deutscher on September 22, 2020, "Not at goal. Medication: Wellbutrin XL 450 mg daily and Lexapro 10 mg daily." The note for the initial appointment with Dr. Briscoe Deutscher on March 15, 2020 indicated the following: " His family eats meals together, he thinks his family will eat healthier with him, he struggles with family and or coworkers weight loss sabotage, his desired weight loss is 85 pounds, he started gaining weight at age 26, his heaviest weight ever was 290 pounds, he craves sweets, baked goods, ice cream, he snacks frequently in the evenings, he skips breakfast and lunch frequently, he is frequently drinking liquids with calories, he frequently makes poor food choices, he has problems with excessive hunger, he frequently eats larger portions than normal and he struggles with emotional eating." Eric [name]'s Food and Mood (modified PHQ-9) score on March 15, 2020 was 9.  During today's appointment, Eric [name] stated he lost 40 pounds since starting with the  clinic. He was verbally administered a questionnaire assessing various behaviors related to emotional  eating behaviors. Eric [name] endorsed a history of the following: overeat when you are celebrating, experience food cravings on a regular basis, use food to help you cope with emotional situations, find food is comforting to you, and overeat frequently when you are bored or lonely. He shared he craves macaroni and cheese. Eric [name] believes the onset of emotional eating behaviors was likely prior to retirement, adding he gained weight when he was reportedly prescribed medication to treat OCD in 1991. He described the current frequency of emotional eating behaviors as 2-3xs when it is late at night and he is watching television. In addition, Eric [name] reported he will consume the entirety of certain foods (e.g., half gallon of ice cream). He recalled the last time he engaged in binge eating behaviors was around February 2022. Currently, Eric [name] indicated out of habit triggers emotional eating behaviors, whereas taking Ozempic makes emotional eating behaviors better. [name] discussed a history of counting calories and restricting food intake, noting the last time was 3-4xs years ago. Eric [name] denied a history of restricting food intake, purging and engagement in other compensatory strategies, and has never been diagnosed with an eating disorder. He also denied a history of treatment for emotional eating or binge eating behaviors. 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    Mental Status Examination:  Appearance: well groomed and appropriate hygiene  Behavior: appropriate to circumstances Mood: euthymic Affect: mood congruent Speech: normal in rate, volume, and tone Eye Contact: appropriate Psychomotor Activity: appropriate  Gait: unable to assess  Thought Process: linear, logical, and goal  directed overall, but tangential at times Thought Content/Perception: denies suicidal and homicidal ideation, plan, and intent, no hallucinations, delusions, bizarre thinking or behavior reported or observed, and denies ideation and engagement in self-injurious behaviors Orientation: time, person, place, and purpose of appointment Memory/Concentration: memory, attention, language, and fund of knowledge intact  Insight/Judgment: fair  Family & Psychosocial History: Lavance reported he is married and he has three adult children (ages 2, 73, and 42). He indicated he is currently retired and on disability. Additionally, Atzin shared his highest level of education obtained is a JD degree. Currently, Yidel's social support system consists of his wife and children. Moreover, Ajeet stated he resides with his wife.   Medical History:  Past Medical History:  Diagnosis Date   Allergy    Anemia    iron- pt denies    Anxiety    Aortic cusp regurgitation    Carotid artery occlusion    Constipation    Coronary artery stenosis    Hyperlipidemia    Lactose intolerance    Major depression, recurrent, chronic (HCC)    Obesity    OCD (obsessive compulsive disorder)    OSA (obstructive sleep apnea)    Other chronic pain    Periodic limb movement disorder    Periodic limb movements of sleep    Prediabetes    Pure hypercholesterolemia    Restless legs    Sleep apnea    wears cpap    Vitamin D deficiency    Past Surgical History:  Procedure Laterality Date   COLONOSCOPY     DG BARIUM ENEMA ([place] [facility])     10-19-2010- turtous, elongated colon    HEMORRHOID SURGERY     TONSILLECTOMY  1960   Current Outpatient Medications on File Prior to Visit  Medication Sig Dispense Refill   ALPRAZolam (XANAX) 0.25 MG tablet Take 1  tablet (0.25 mg total) by mouth 2 (two) times daily as needed for anxiety or sleep. 30 tablet 1   aspirin 81 MG tablet Take 81 mg by mouth daily.     buPROPion (WELLBUTRIN XL)  150 MG 24 hr tablet Take 3 tablets (450 mg total) by mouth daily. 270 tablet 1   Cholecalciferol (VITAMIN D-3) 5000 units TABS Take 5,000 Units by mouth daily.      escitalopram (LEXAPRO) 20 MG tablet Take 20 mg by mouth daily.     gabapentin (NEURONTIN) 300 MG capsule 1 at night for 5 nights, then 2 at night for restless legs 60 capsule 1   lactase (LACTAID) 3000 units tablet Take 3,000 Units by mouth as needed.      methylphenidate (RITALIN) 10 MG tablet Take 3 tablets (30 mg total) by mouth 2 (two) times daily. (Patient taking differently: Take 30 mg by mouth 2 (two) times daily. 3 tabs daily) 180 tablet 0   risperiDONE (RISPERDAL) 0.5 MG tablet Take 0.5 mg by mouth as needed.     rOPINIRole (REQUIP) 1 MG tablet TAKE 3 TABLETS (3 MG TOTAL) BY MOUTH AT BEDTIME. (Patient not taking: Reported on 10/05/2020) 270 tablet 0   Semaglutide, 2 MG/DOSE, 8 MG/3ML SOPN Inject 2 mg as directed once a week. 9 mL 0   simvastatin (ZOCOR) 20 MG tablet Take 20 mg by mouth at bedtime.     No current facility-administered medications on file prior to visit.  Medication compliant overall.   Mental Health History: Jamail reported he met with a psychotherapist to address symptoms of OCD in the last 15 years, noting their last appointment was in 2015. Chart review indicated Hung's current psychiatrist is Dr. Lynder Parents. They meet 1x a month for medication management and their next appointment is scheduled on October 12th or 13th. Annette reported there is no history of hospitalizations for psychiatric concerns. Markelle denied a family history of substance abuse. He indicated his mother was "extremely anxious," noting she was also diagnosed with "manic depression" and had "shock therapy." He indicated his brother was diagnosed as "paranoid schizophrenic" and died by suicide after their mother passed away. Rourke recalled his brother was "difficult to deal with." Taryn reported there is no history of trauma including  psychological, physical , and sexual abuse, as well as neglect. However, he indicated he had a "rough childhood" due to his brother causing "lots of problems."  Bransyn reported experiencing suicidal ideation "years ago." He stated he thought about using a gun, but denied ever experiencing suicidal intent. He indicated he last experienced suicidal ideation approximately 15-20 years ago. The following protective factors were identified for Torez: grandchildren and wife. If he were to become overwhelmed in the future, which is a sign that a crisis may occur, he identified the following coping skills he could engage in: focusing on what he has (e.g., wife, providers, children, grandchildren). Psychoeducation regarding the importance of reaching out to a trusted individual and/or utilizing emergency resources if there is a change in emotional status and/or there is an inability to ensure safety was provided. Laster's confidence in reaching out to a trusted individual and/or utilizing emergency resources should there be an intensification in emotional status and/or there is an inability to ensure safety was assessed on a scale of one to ten where one is not confident and ten is extremely confident. He reported his confidence is a 10. Additionally, Arafat endorsed current access to firearms and/or weapons. He stated he has a  couple "long guns" and agreed to re-locate them if there were any safety concerns. Notably, he indicated there are no bullets in the house.   Richard described his typical mood lately as "okay" and "a little depressed" due to "OCD." Aside from concerns noted above and endorsed on the PHQ-9 and GAD-7, Redding reported his wife is concerned about his memory; however, he indicated he is not. Jsoeph stated Dr. Clovis Pu is aware about these concerns and his memory was recently tested and there were reportedly no concerns. 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 Currently, he likes "certain" switches to be a "certain way," noting he does not feel something bad will happen if they are not how he wants them to be. 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 Moreover, Curtiss stated he consumes 1-2 standard drinks couple times a week. He denied current tobacco use. He denied current illicit/recreational substance use. Regarding caffeine intake, Zarek reported consuming 8 oz of coffee daily, noting some days he drinks up to 20oz. Furthermore, Eric [name] indicated he is not experiencing the following: hallucinations and delusions, paranoia, symptoms of mania , social withdrawal, crying spells, and memory concerns. He also denied current suicidal ideation, plan, and intent; history of and current homicidal ideation, plan, and intent; and history of and current engagement in self-harm.  The following strengths were reported by Eric [name]: ability to analyze and wood working. The following strengths were observed by this provider: ability to express thoughts and feelings during the therapeutic session, ability to establish and benefit from a therapeutic relationship, willingness to work toward established goal(s) with the clinic and ability to engage in reciprocal conversation.   Legal History: Zeppelin reported there is no history of legal involvement.   Structured Assessments Results: The Patient Health Questionnaire-9 (PHQ-9) is a self-report measure that assesses symptoms and severity of depression over the course of the last two weeks. Phil obtained a score of 8 suggesting mild depression. Josemaria finds the endorsed symptoms to be very difficult. [0= Not at all; 1= Several days; 2= More than half the days; 3= Nearly every day] Little  interest or pleasure in doing things 0  Feeling down, depressed, or hopeless 0  Trouble falling or staying asleep, or sleeping too much 1  Feeling tired or having little energy 3  Poor appetite or overeating 3  Feeling bad about yourself --- or that you are a failure or have let yourself or your family down 0  Trouble concentrating on things, such as reading the newspaper or watching television 1  Moving or speaking so slowly that other people could have noticed? Or the opposite --- being so fidgety or restless that you have been moving around a lot more than usual 0  Thoughts that you would be better off dead or hurting yourself in some way 0  PHQ-9 Score 8    The Generalized Anxiety Disorder-7 (GAD-7) is a brief self-report measure that assesses symptoms of anxiety over the course of the last two weeks. Berle obtained a score of 0. [0= Not at all; 1= Several days; 2= Over half the days; 3= Nearly every day] Feeling nervous, anxious, on edge 0  Not being able to stop or control worrying 0  Worrying  too much about different things 0  Trouble relaxing 0  Being so restless that it's hard to sit still 0  Becoming easily annoyed or irritable 0  Feeling afraid as if something awful might happen 0  GAD-7 Score 0   Interventions:  Conducted a chart review Focused on rapport building Verbally administered PHQ-9 and GAD-7 for symptom monitoring Verbally administered Food & Mood questionnaire to assess various behaviors related to emotional eating Provided emphatic reflections and validation Collaborated with patient on a treatment goal  Psychoeducation provided regarding physical versus emotional hunger Conducted a risk assessment   Provisional DSM-5 Diagnosis(es): F32.89 Other Specified Depressive Disorder, Emotional Eating Behaviors and F42.9 Unspecified Obsessive-Compulsive and Related Disorder  Plan: Tafari appears able and willing to participate as evidenced by collaboration on a  treatment goal, engagement in reciprocal conversation, and asking questions as needed for clarification. The next appointment will be scheduled in two weeks, which will be via MyChart Video Visit. The following treatment goal was established: increase coping skills. This provider will regularly review the treatment plan and medical chart to keep informed of status changes. Dannie expressed understanding and agreement with the initial treatment plan of care. Thailan will be sent a handout via e-mail to utilize between now and the next appointment to increase awareness of hunger patterns and subsequent eating. Eric [name] provided verbal consent during today's appointment for this provider to send the handout via e-mail.

## 2020-09-27 NOTE — Telephone Encounter (Signed)
Spoke with pt and he stated that the pharmacy told him the Ozempic was half the price of the Mounjaro 12.5 dose. Pt informed that we went in the [facility] due to the pharmacy informing us that it was the cheaper option. He wanted to know which to take. I informed him that it was completely up to him since he was paying out of pocked for the medication and they work essentially the same way.

## 2020-09-27 NOTE — Telephone Encounter (Signed)
Pt called in and stated that he doesn't know what inject to pick up at the pharmacy. The pt stated that he would like a call back today before 5. I informed him that they are with pts and I cant conform that they can. He said to put that in the telephone message.

## 2020-09-28 ENCOUNTER — Encounter (INDEPENDENT_AMBULATORY_CARE_PROVIDER_SITE_OTHER): Payer: Self-pay

## 2020-09-28 IMAGING — DX DG HIP (WITH OR WITHOUT PELVIS) 2-3V*L*
2 series · 2 of 2 positions shown · non-contrast
Comparison: None.

CLINICAL DATA: Bilateral hip pain

EXAM:
DG PELVIS AND BILATERAL HIPS: 4+ V

[hip ap]
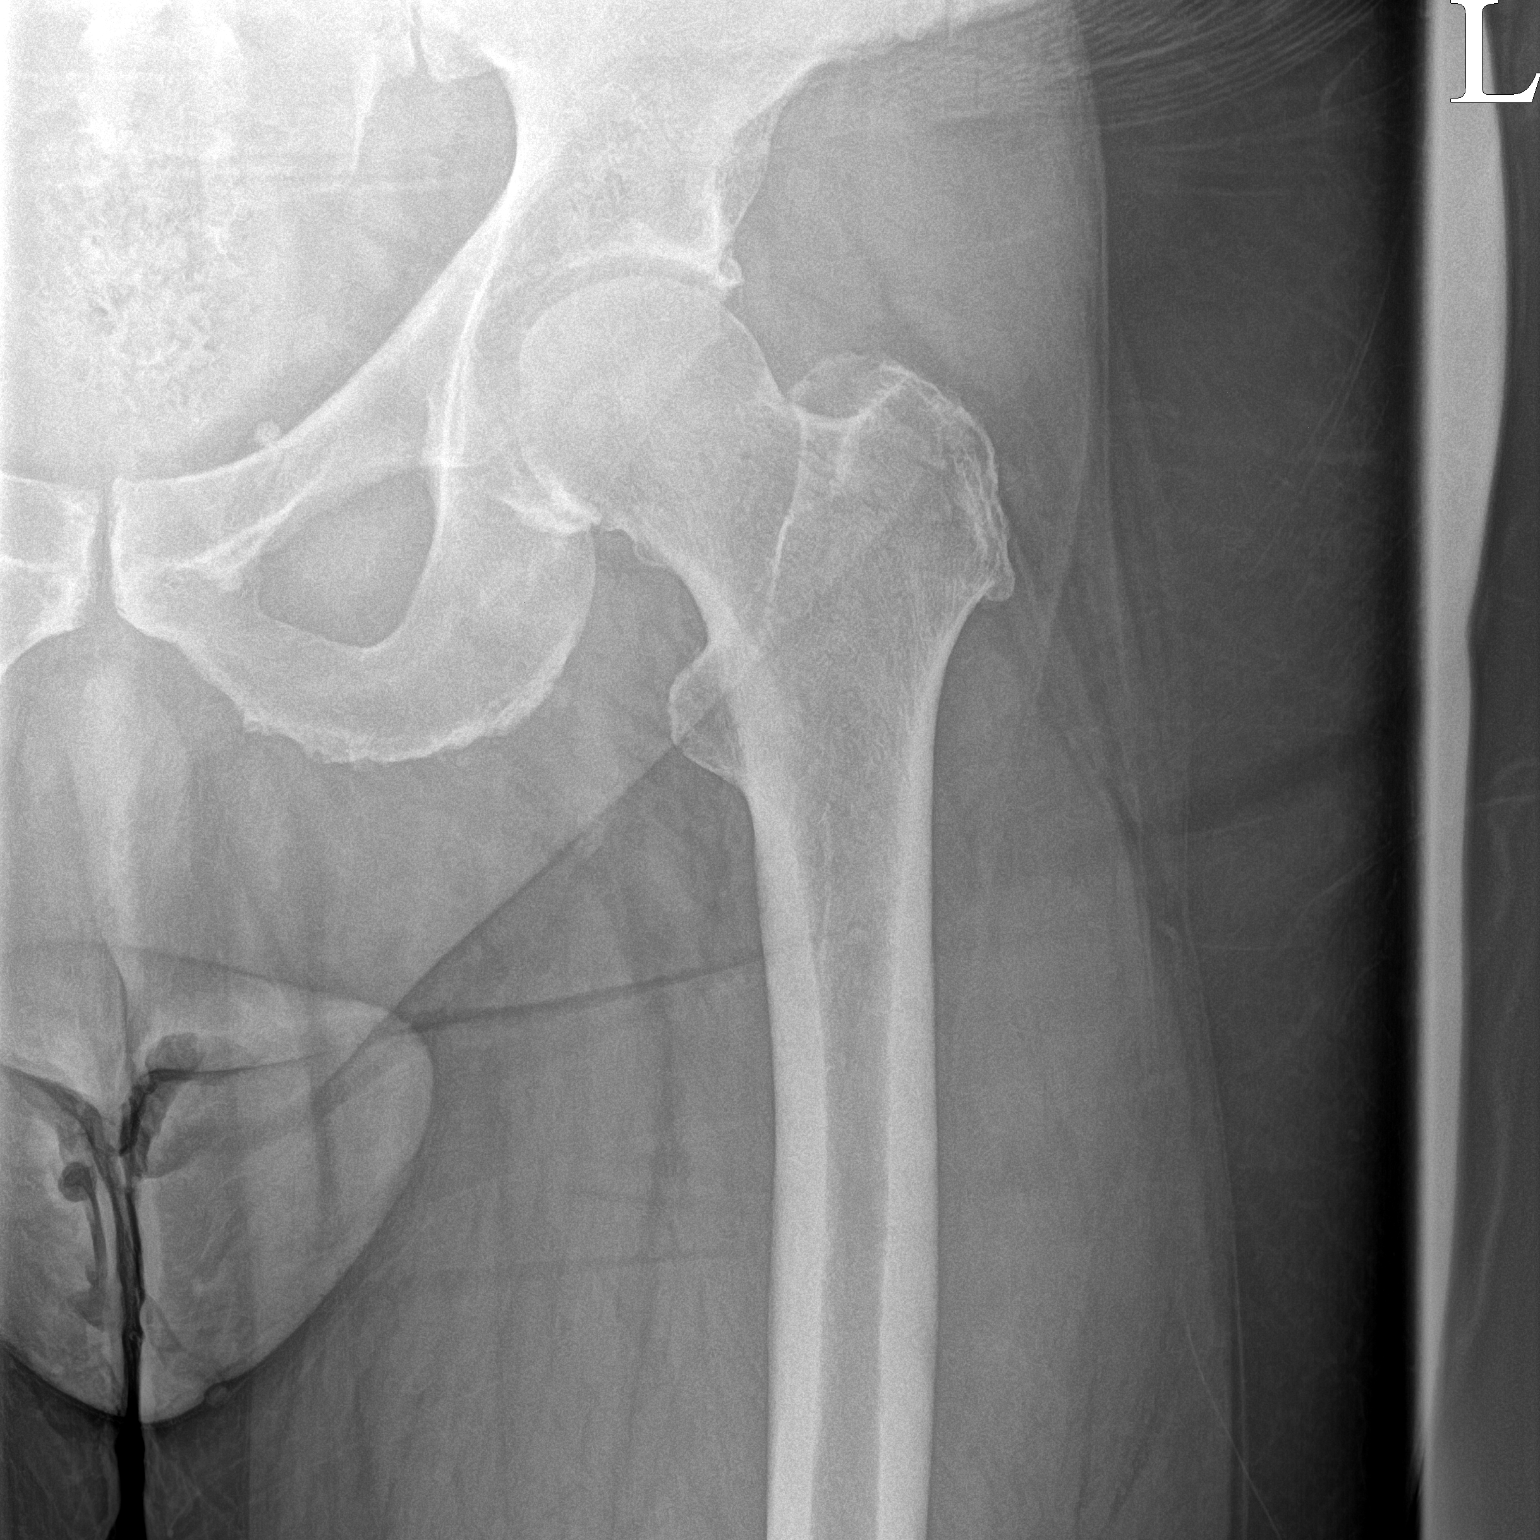

[hip frog leg]
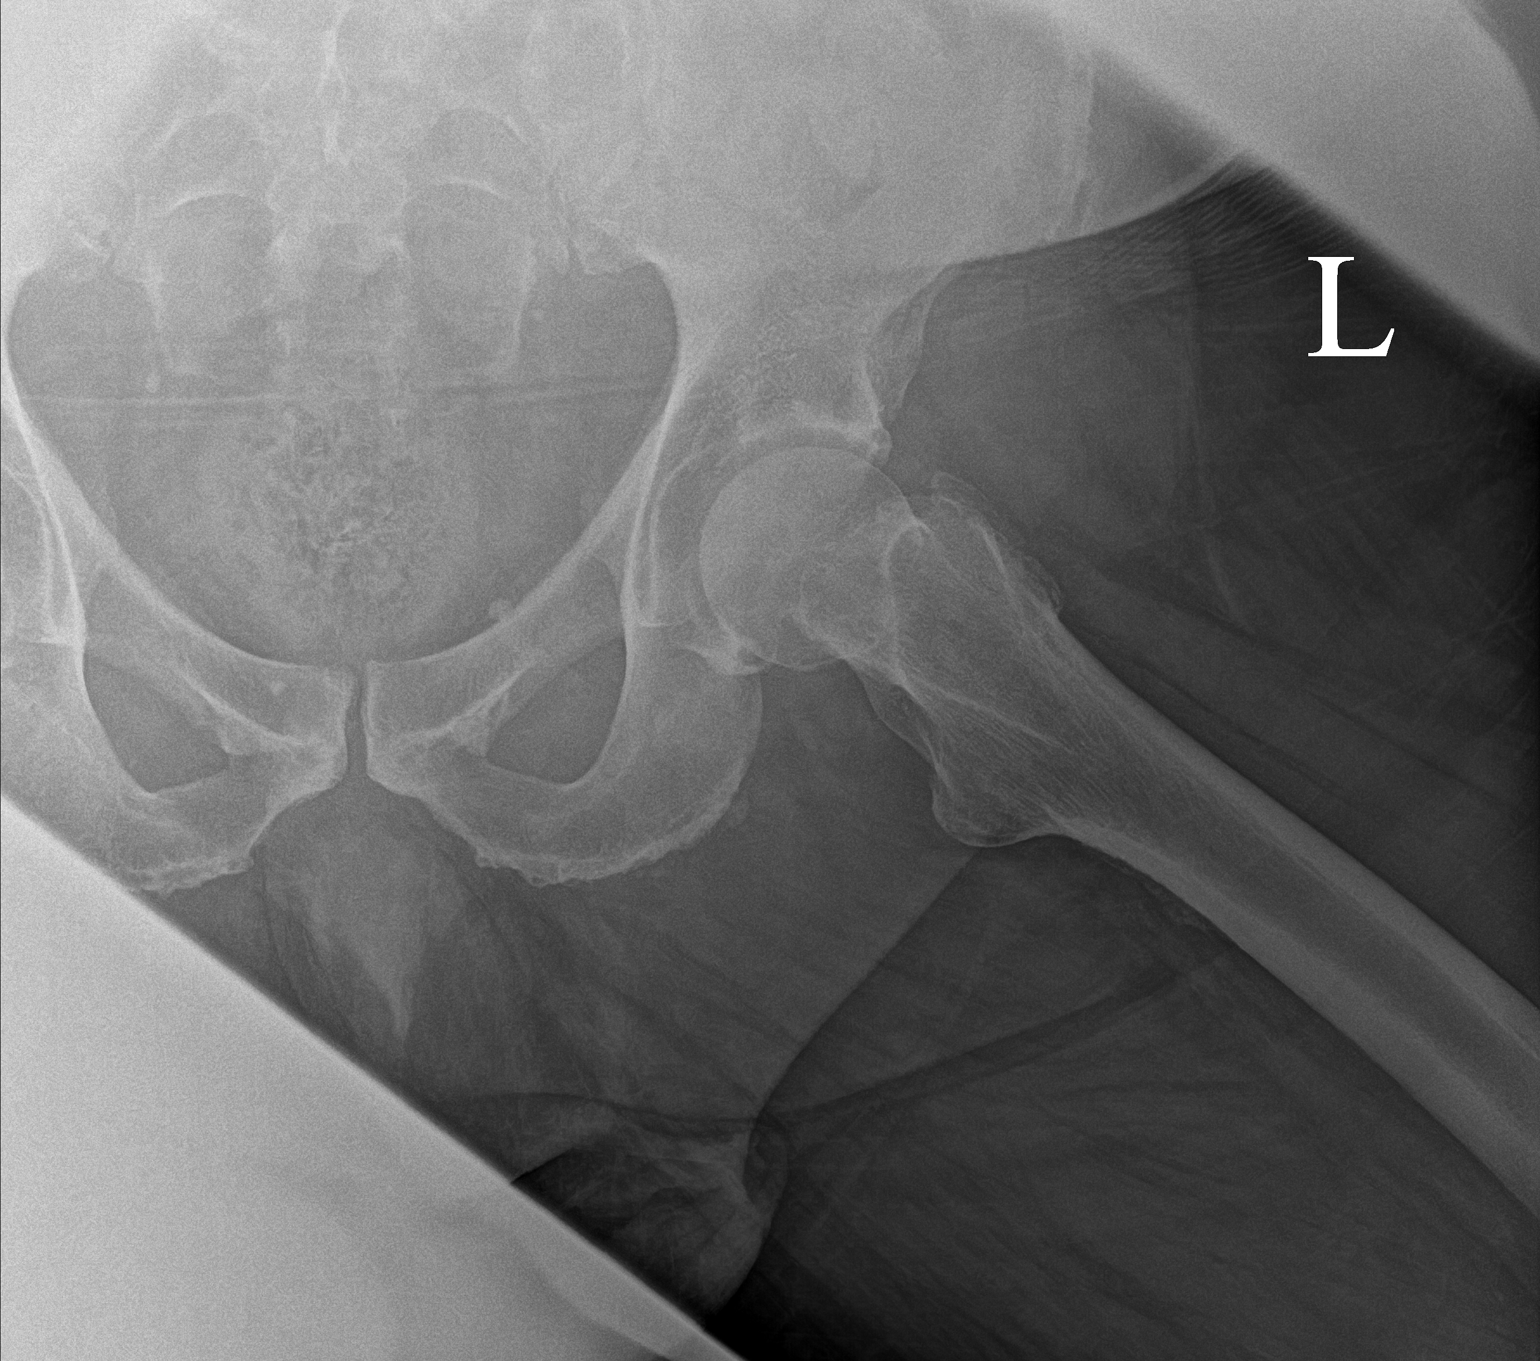

[2 of 2 positions shown; findings below may reference images not displayed]

FINDINGS: Frontal pelvis as well as frontal and lateral hip images bilaterally
total 5-views obtained. No fracture or dislocation. There is
moderate symmetric narrowing of each hip joint. No erosive change.
IMPRESSION: No fracture or dislocation.  Symmetric narrowing each hip joint.

## 2020-09-29 ENCOUNTER — Encounter: Payer: Self-pay | Admitting: Psychiatry

## 2020-09-29 ENCOUNTER — Ambulatory Visit (INDEPENDENT_AMBULATORY_CARE_PROVIDER_SITE_OTHER): Payer: Medicare Other | Admitting: Psychiatry

## 2020-09-29 ENCOUNTER — Other Ambulatory Visit: Payer: Self-pay

## 2020-09-29 VITALS — BP 124/72 | HR 66

## 2020-09-29 DIAGNOSIS — F422 Mixed obsessional thoughts and acts: Secondary | ICD-10-CM

## 2020-09-29 DIAGNOSIS — G4733 Obstructive sleep apnea (adult) (pediatric): Secondary | ICD-10-CM

## 2020-09-29 DIAGNOSIS — G2581 Restless legs syndrome: Secondary | ICD-10-CM

## 2020-09-29 DIAGNOSIS — F331 Major depressive disorder, recurrent, moderate: Secondary | ICD-10-CM | POA: Diagnosis not present

## 2020-09-29 DIAGNOSIS — R7989 Other specified abnormal findings of blood chemistry: Secondary | ICD-10-CM

## 2020-09-29 MED ORDER — GABAPENTIN 300 MG PO CAPS
ORAL_CAPSULE | ORAL | 1 refills | Status: DC
Start: 2020-09-29 — End: 2020-10-19

## 2020-09-29 NOTE — Progress Notes (Signed)
Chief Complaint:   OBESITY English is here to discuss his progress with his obesity treatment plan along with follow-up of his obesity related diagnoses.   Today's visit was #: 14 Starting weight: 285 lbs Starting date: 03/15/2020 Today's weight: 248 lbs Today's date: 09/22/2020 Weight change since last visit: 0 Total lbs lost to date: 37 lbs Body mass index is 34.59 kg/m.  Total weight loss percentage to date: -12.98%  Current Meal Plan: the Category 2 Plan for 0% of the time.  Current Exercise Plan: Walking/strength training for 30 minutes 3-4 times per week. Current Anti-Obesity Medications:  Mounjaro 10 mg subcutaneously weekly.  Side effects:  None  Interim History:  Eric [name] celebrated his 80nd birthday and had 2 slices of birthday cake per day.  Mounjaro with insurance costs ($525.43).  Ozempic is ($623.25).  Assessment/Plan:   1. Insulin resistance, with polyphagia Not at goal. Goal is HgbA1c < 5.7, fasting insulin closer to 5.  Medication: Mounjaro 10 mg subcutaneously weekly.    Plan:  Increase Mounjaro to 12.5 mg subcutaneously weekly, as per below.  He will continue to focus on protein-rich, low simple carbohydrate foods. We reviewed the importance of hydration, regular exercise for stress reduction, and restorative sleep.   - Increase and refill tirzepatide (MOUNJARO) 12.5 MG/0.5ML Pen; Inject 12.5 mg into the skin once a week.  Dispense: 2 mL; Refill: 0  2. Other depression, with emotional eating Not at goal. Medication: Wellbutrin XL 450 mg daily and Lexapro 10 mg daily.  Plan:  Patient was referred to Dr. Dewaine [name], our Bariatric Psychologist, for evaluation due to his elevated PHQ-9 score and significant struggles with emotional eating.  3. Obesity, current BMI 34.6  Course: Eric [name] is currently in the action stage of change. As such, his goal is to continue with weight loss efforts.   Nutrition goals: He has agreed to the Category 2 Plan +500 calories.    Exercise goals:  As is.  Behavioral modification strategies: increasing lean protein intake, decreasing simple carbohydrates, increasing vegetables, increasing water intake, decreasing liquid calories, and emotional eating strategies.  Eric [name] has agreed to follow-up with our clinic in 2-3 weeks. He was informed of the importance of frequent follow-up visits to maximize his success with intensive lifestyle modifications for his multiple health conditions.   Objective:   Blood pressure 124/68, pulse 68, temperature 97.8 F (36.6 C), temperature source Oral, height 5\' 11"  (1.803 m), weight 248 lb (112.5 kg), SpO2 97 %. Body mass index is 34.59 kg/m.  General: Cooperative, alert, well developed, in no acute distress. HEENT: Conjunctivae and lids unremarkable. Cardiovascular: Regular rhythm.  Lungs: Normal work of breathing. Neurologic: No focal deficits.   Obesity Behavioral Intervention:   Approximately 15 minutes were spent on the discussion below.  ASK: We discussed the diagnosis of obesity with today and Eric [name] agreed to give Eric [name] permission to discuss obesity behavioral modification therapy today.  ASSESS: Eric [name] has the diagnosis of obesity and his BMI today is 34.6. Eric [name] is in the action stage of change.   ADVISE: Eric [name] was educated on the multiple health risks of obesity as well as the benefit of weight loss to improve his health. He was advised of the need for long term treatment and the importance of lifestyle modifications to improve his current health and to decrease his risk of future health problems.  AGREE: Multiple dietary modification options and treatment options were discussed and Eric [name] agreed to follow the recommendations documented in the above  note.  ARRANGE: Eric [name] was educated on the importance of frequent visits to treat obesity as outlined per CMS and USPSTF guidelines and agreed to schedule his next follow up appointment  today.  Attestation Statements:   Reviewed by clinician on day of visit: allergies, medications, problem list, medical history, surgical history, family history, social history, and previous encounter notes.  I, Insurance claims handler, CMA, am acting as transcriptionist for Helane Rima, DO  I have reviewed the above documentation for accuracy and completeness, and I agree with the above. Helane Rima, DO

## 2020-09-29 NOTE — Patient Instructions (Signed)
Switch ropinirole to gabapentin by doing the following: Start gabapentin 1 of the 300 mg capsules at night and continue ropinirole 2 mg for 5 nights,  then increase gabapentin to 2 capsules nightly and reduce ropinirole to 1 mg nightly for 5 nights, Then reduce ropinirole to 1/2 tablet at night for 5 nights, Then stop ropinirole and continue the 2 gabapentin capsules nightly.

## 2020-09-29 NOTE — Progress Notes (Signed)
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 11:40 AM Helane Rima, DO [facility] None  10/27/2020  2:30 PM Cottle, Steva Ready., MD [facility] None  11/24/2020  1:30 PM Cottle, Steva Ready., MD [facility] None  01/02/2021  1:30 PM Cottle, Steva Ready., MD [facility] None    No orders of the defined types were placed in this encounter.     -------------------------------

## 2020-10-05 ENCOUNTER — Encounter (INDEPENDENT_AMBULATORY_CARE_PROVIDER_SITE_OTHER): Payer: Self-pay | Admitting: Family Medicine

## 2020-10-05 ENCOUNTER — Other Ambulatory Visit: Payer: Self-pay

## 2020-10-05 ENCOUNTER — Ambulatory Visit (INDEPENDENT_AMBULATORY_CARE_PROVIDER_SITE_OTHER): Payer: Medicare Other | Admitting: Family Medicine

## 2020-10-05 VITALS — BP 133/75 | HR 70 | Temp 97.7°F | Ht 71.0 in | Wt 247.0 lb

## 2020-10-05 DIAGNOSIS — Z6839 Body mass index (BMI) 39.0-39.9, adult: Secondary | ICD-10-CM | POA: Diagnosis not present

## 2020-10-05 DIAGNOSIS — R7301 Impaired fasting glucose: Secondary | ICD-10-CM | POA: Diagnosis not present

## 2020-10-05 DIAGNOSIS — E7849 Other hyperlipidemia: Secondary | ICD-10-CM | POA: Diagnosis not present

## 2020-10-05 MED ORDER — SEMAGLUTIDE (2 MG/DOSE) 8 MG/3ML ~~LOC~~ SOPN: 2.0000 mg | PEN_INJECTOR | SUBCUTANEOUS | 0 refills | Status: DC

## 2020-10-05 NOTE — Progress Notes (Signed)
Chief Complaint:   OBESITY Eric [name] is here to discuss his progress with his obesity treatment plan along with follow-up of his obesity related diagnoses. Eric [name] is on the Category 2 Plan + 500 calories and states he is following his eating plan approximately 75% of the time. Eric [name] states he is walking ? minutes 3 times per week.  Today's visit was #: 15 Starting weight: 285 lbs Starting date: 03/15/2020 Today's weight: 247 lbs Today's date: 10/05/2020 Total lbs lost to date: 38 Total lbs lost since last in-office visit: 1  Interim History: [name] has been having issues with Mounjaro due to GI side effects. He has tried to get up to 10 mg dose of Mounjaro, but had nausea and overall malaise. He is still able to get all food in and would just feel nauseous.  Subjective:   1. Impaired fasting glucose Eric [name] is on Mounjaro 10 mg. He did not tolerate increased dose and is requesting to go back to Ozempic.  2. Other hyperlipidemia He is on statin therapy with no myalgias or transaminitis.  Assessment/Plan:   1. Impaired fasting glucose Discontinue Mounjaro and restart Ozempic 2 mg weekly.  Restart- Semaglutide, 2 MG/DOSE, 8 MG/3ML SOPN; Inject 2 mg as directed once a week.  Dispense: 9 mL; Refill: 0  2. Other hyperlipidemia Cardiovascular risk and specific lipid/LDL goals reviewed.  We discussed several lifestyle modifications today and Kayceon will continue to work on diet, exercise and weight loss efforts. Orders and follow up as documented in patient record. Continue current treatment plan.  Counseling Intensive lifestyle modifications are the first line treatment for this issue. Dietary changes: Increase soluble fiber. Decrease simple carbohydrates. Exercise changes: Moderate to vigorous-intensity aerobic activity 150 minutes per week if tolerated. Lipid-lowering medications: see documented in medical record.  3. Obesity, current BMI of 34.4  Eric [name] is currently in  the action stage of change. As such, his goal is to continue with weight loss efforts. He has agreed to the Category 3 Plan.   Exercise goals: All adults should avoid inactivity. Some physical activity is better than none, and adults who participate in any amount of physical activity gain some health benefits. Add in resistance training 20 minutes 4 times a week.  Behavioral modification strategies: increasing lean protein intake, meal planning and cooking strategies, keeping healthy foods in the home, and planning for success.  Eric [name] has agreed to follow-up with our clinic in 3 weeks. He was informed of the importance of frequent follow-up visits to maximize his success with intensive lifestyle modifications for his multiple health conditions.   Objective:   Blood pressure 133/75, pulse 70, temperature 97.7 F (36.5 C), height 5\' 11"  (1.803 m), weight 247 lb (112 kg), SpO2 96 %. Body mass index is 34.45 kg/m.  General: Cooperative, alert, well developed, in no acute distress. HEENT: Conjunctivae and lids unremarkable. Cardiovascular: Regular rhythm.  Lungs: Normal work of breathing. Neurologic: No focal deficits.   No results found for: CREATININE, BUN, NA, K, CL, CO2 No results found for: ALT, AST, GGT, ALKPHOS, BILITOT No results found for: HGBA1C No results found for: INSULIN No results found for: TSH No results found for: CHOL, HDL, LDLCALC, LDLDIRECT, TRIG, CHOLHDL No results found for: No results found for: WBC, HGB, HCT, MCV, PLT No results found for: IRON, TIBC, FERRITIN  Obesity Behavioral Intervention:   Approximately 15 minutes were spent on the discussion below.  ASK: We discussed the diagnosis of obesity with Erion today and Eric [name] Needle  agreed to give [place] permission to discuss obesity behavioral modification therapy today.  ASSESS: Eric [name] has the diagnosis of obesity and his BMI today is 34.4. Eric [name] is in the action stage of change.   ADVISE: Waverly  was educated on the multiple health risks of obesity as well as the benefit of weight loss to improve his health. He was advised of the need for long term treatment and the importance of lifestyle modifications to improve his current health and to decrease his risk of future health problems.  AGREE: Multiple dietary modification options and treatment options were discussed and Eric [name] agreed to follow the recommendations documented in the above note.  ARRANGE: Eric [name] was educated on the importance of frequent visits to treat obesity as outlined per CMS and USPSTF guidelines and agreed to schedule his next follow up appointment today.  Attestation Statements:   Reviewed by clinician on day of visit: allergies, medications, problem list, medical history, surgical history, family history, social history, and previous encounter notes.  Edmund Hilda, CMA, am acting as transcriptionist for Reuben Likes, MD.   I have reviewed the above documentation for accuracy and completeness, and I agree with the above. - Reuben Likes, MD

## 2020-10-10 ENCOUNTER — Telehealth (INDEPENDENT_AMBULATORY_CARE_PROVIDER_SITE_OTHER): Payer: Medicare Other | Admitting: Psychology

## 2020-10-10 DIAGNOSIS — F429 Obsessive-compulsive disorder, unspecified: Secondary | ICD-10-CM | POA: Diagnosis not present

## 2020-10-10 DIAGNOSIS — F3289 Other specified depressive episodes: Secondary | ICD-10-CM

## 2020-10-10 NOTE — Progress Notes (Signed)
  Office: [phone]  /  Fax: [phone]    Date: October 24, 2020   Appointment Start Time: 2:37pm Duration: 25 minutes Provider: Lawerance Cruel, Psy.D. Type of Session: Individual Therapy  Location of Patient: Home (private room) Location of Provider: Provider's Home (private office) Type of Contact: Telepsychological Visit via MyChart Video Visit  Session Content: This provider called [name] at 2:32pm as he did not present for today's appointment. A HIPAA compliant voicemail was left requesting a call back. This provider was informed by front desk staff at 2:35pm that Tighe called the clinic, noting he was unable to join the appointment via MyChart Video Visit. He agreed to proceed via a regular telephone call for today's appointment. As such, today's appointment was initiated 7 minutes late.   Faruq is a 70 y.o. male presenting for a follow-up appointment to address the previously established treatment goal of increasing coping skills.Today's appointment was a telepsychological visit due to COVID-19. [name] provided verbal consent for today's telepsychological appointment and he is aware he is responsible for securing confidentiality on his end of the session. Prior to proceeding with today's appointment, Eiden's physical location at the time of this appointment was obtained as well a phone number he could be reached at in the event of technical difficulties. [name] and this provider participated in today's telepsychological service.   This provider conducted a brief check-in. Britian reported he continues to gain weight, noting he is eating ice cream at night and is staying up late. He shared when he is able to go to bed early, he is eating better during the day. Due to ongoing sleeping difficulties, psychoeducation regarding sleep hygiene was provided. [name] provided verbal consent during today's appointment for this provider to send a handout about sleep hygiene via e-mail.  Overall, Pride was receptive to today's appointment as evidenced by openness to sharing, responsiveness to feedback, and willingness to implement discussed strategies .  Mental Status Examination:  Appearance: unable to assess  Behavior: unable to assess Mood: euthymic Affect: unable to fully assess Speech: normal in rate, volume, and tone Eye Contact: unable to assess Psychomotor Activity: unable to assess  Gait: unable to assess Thought Process: linear, logical, and goal directed  Thought Content/Perception: no hallucinations, delusions, bizarre thinking or behavior reported or observed and no evidence or endorsement of suicidal and homicidal ideation, plan, and intent Orientation: time, person, place, and purpose of appointment Memory/Concentration: memory, attention, language, and fund of knowledge intact  Insight/Judgment: fair  Interventions:  Conducted a brief chart review Provided empathic reflections and validation Employed supportive psychotherapy interventions to facilitate reduced distress and to improve coping skills with identified stressors Psychoeducation provided regarding sleep hygiene  DSM-5 Diagnosis(es):  F32.89 Other Specified Depressive Disorder, Emotional Eating Behaviors and F42.9 Unspecified Obsessive-Compulsive and Related Disorder  Treatment Goal & Progress: During the initial appointment with this provider, the following treatment goal was established: increase coping skills. Progress is limited, as Daley has just begun treatment with this provider; however, he is receptive to the interaction and interventions and rapport is being established.   Plan: Chanler stated he is traveling out of state from October 13th-November 9th. Thus, the next appointment will be scheduled in 6 weeks, which will be via MyChart Video Visit. The next session will focus on working towards the established treatment goal.

## 2020-10-14 ENCOUNTER — Encounter (INDEPENDENT_AMBULATORY_CARE_PROVIDER_SITE_OTHER): Payer: Self-pay

## 2020-10-17 ENCOUNTER — Ambulatory Visit (INDEPENDENT_AMBULATORY_CARE_PROVIDER_SITE_OTHER): Payer: Medicare Other | Admitting: Family Medicine

## 2020-10-17 DIAGNOSIS — Z23 Encounter for immunization: Secondary | ICD-10-CM | POA: Diagnosis not present

## 2020-10-19 ENCOUNTER — Encounter (INDEPENDENT_AMBULATORY_CARE_PROVIDER_SITE_OTHER): Payer: Self-pay | Admitting: Family Medicine

## 2020-10-19 ENCOUNTER — Ambulatory Visit (INDEPENDENT_AMBULATORY_CARE_PROVIDER_SITE_OTHER): Payer: Medicare Other | Admitting: Family Medicine

## 2020-10-19 ENCOUNTER — Other Ambulatory Visit: Payer: Self-pay

## 2020-10-19 VITALS — BP 128/74 | HR 66 | Temp 97.5°F | Ht 71.0 in | Wt 245.0 lb

## 2020-10-19 DIAGNOSIS — Z6839 Body mass index (BMI) 39.0-39.9, adult: Secondary | ICD-10-CM

## 2020-10-19 DIAGNOSIS — E65 Localized adiposity: Secondary | ICD-10-CM

## 2020-10-19 DIAGNOSIS — E8881 Metabolic syndrome: Secondary | ICD-10-CM

## 2020-10-19 DIAGNOSIS — E88819 Insulin resistance, unspecified: Secondary | ICD-10-CM

## 2020-10-19 MED ORDER — SEMAGLUTIDE (2 MG/DOSE) 8 MG/3ML ~~LOC~~ SOPN: 2.0000 mg | PEN_INJECTOR | SUBCUTANEOUS | 0 refills | Status: DC

## 2020-10-20 NOTE — Progress Notes (Signed)
Chief Complaint:   OBESITY Eric [name] is here to discuss his progress with his obesity treatment plan along with follow-up of his obesity related diagnoses.   Today's visit was #: 16 Starting weight: 285 lbs Starting date: 03/15/2020 Today's weight: 245 lbs Today's date: 10/19/2020 Weight change since last visit: 2 lbs Total lbs lost to date: 40 lbs Body mass index is 34.17 kg/m.  Total weight loss percentage to date: -14.04%  Current Meal Plan: the Category 3 Plan for 0% of the time.  Current Exercise Plan: Walking/strength training for 30 minutes 6-7 times per week. Current Anti-Obesity Medications: Ozempic 2 mg subcutaneously weekly. Side effects: None.  Interim History:  Eric [name] says he will be going to [place] on October 14, and will be staying until the first week in November.  He reports that he switched from ice cream to sherbet.  He says he does better in terms of night eating if he goes to bed earlier.  Assessment/Plan:   1. Insulin resistance, with polyphagia Goal is HgbA1c < 5.7, fasting insulin closer to 5.  Medication: Ozempic 2 mg subcutaneously weekly.    Plan:  Continue Ozempic 2 mg subcutaneously weekly.  Will refill today, as per below.  He will continue to focus on protein-rich, low simple carbohydrate foods. We reviewed the importance of hydration, regular exercise for stress reduction, and restorative sleep.   - Refill Semaglutide, 2 MG/DOSE, 8 MG/3ML SOPN; Inject 2 mg as directed once a week.  Dispense: 6 mL; Refill: 0  2. Metabolic syndrome Starting goal: Lose 7-10% of starting weight. He will continue to focus on protein-rich, low simple carbohydrate foods. We reviewed the importance of hydration, regular exercise for stress reduction, and restorative sleep.  We will continue to check lab work every 3 months, with 10% weight loss, or should any other concerns arise.  3. Visceral obesity Current visceral fat rating: 21. Visceral fat rating should be < 13.  Visceral adipose tissue is a hormonally active component of total body fat. He will continue to focus on protein-rich, low simple carbohydrate foods. We reviewed the importance of hydration, regular exercise for stress reduction, and restorative sleep.   4. Obesity, current BMI of 34.3  Course: Eric [name] is currently in the action stage of change. As such, his goal is to continue with weight loss efforts.   Nutrition goals: He has agreed to the Category 3 Plan.   Exercise goals: For substantial health benefits, adults should do at least 150 minutes (2 hours and 30 minutes) a week of moderate-intensity, or 75 minutes (1 hour and 15 minutes) a week of vigorous-intensity aerobic physical activity, or an equivalent combination of moderate- and vigorous-intensity aerobic activity. Aerobic activity should be performed in episodes of at least 10 minutes, and preferably, it should be spread throughout the week.  Behavioral modification strategies: increasing lean protein intake, decreasing simple carbohydrates, increasing vegetables, and increasing water intake.  [name] has agreed to follow-up with our clinic in 4 weeks. He was informed of the importance of frequent follow-up visits to maximize his success with intensive lifestyle modifications for his multiple health conditions.   Objective:   Blood pressure 128/74, pulse 66, temperature (!) 97.5 F (36.4 C), temperature source Oral, height 5\' 11"  (1.803 m), weight 245 lb (111.1 kg), SpO2 97 %. Body mass index is 34.17 kg/m.  General: Cooperative, alert, well developed, in no acute distress. HEENT: Conjunctivae and lids unremarkable. Cardiovascular: Regular rhythm.  Lungs: Normal work of breathing. Neurologic: No  focal deficits.   Attestation Statements:   Reviewed by clinician on day of visit: allergies, medications, problem list, medical history, surgical history, family history, social history, and previous encounter notes.  I, Insurance claims handler,  CMA, am acting as transcriptionist for Helane Rima, DO  I have reviewed the above documentation for accuracy and completeness, and I agree with the above. Helane Rima, DO

## 2020-10-24 ENCOUNTER — Telehealth (INDEPENDENT_AMBULATORY_CARE_PROVIDER_SITE_OTHER): Payer: Medicare Other | Admitting: Psychology

## 2020-10-24 DIAGNOSIS — F429 Obsessive-compulsive disorder, unspecified: Secondary | ICD-10-CM

## 2020-10-24 DIAGNOSIS — F3289 Other specified depressive episodes: Secondary | ICD-10-CM

## 2020-10-27 ENCOUNTER — Other Ambulatory Visit: Payer: Self-pay

## 2020-10-27 ENCOUNTER — Other Ambulatory Visit: Payer: Self-pay | Admitting: Psychiatry

## 2020-10-27 ENCOUNTER — Ambulatory Visit (INDEPENDENT_AMBULATORY_CARE_PROVIDER_SITE_OTHER): Payer: Medicare Other | Admitting: Psychiatry

## 2020-10-27 DIAGNOSIS — F5221 Male erectile disorder: Secondary | ICD-10-CM

## 2020-10-27 DIAGNOSIS — G2581 Restless legs syndrome: Secondary | ICD-10-CM

## 2020-10-27 DIAGNOSIS — F331 Major depressive disorder, recurrent, moderate: Secondary | ICD-10-CM | POA: Diagnosis not present

## 2020-10-27 DIAGNOSIS — F422 Mixed obsessional thoughts and acts: Secondary | ICD-10-CM

## 2020-10-27 DIAGNOSIS — G4733 Obstructive sleep apnea (adult) (pediatric): Secondary | ICD-10-CM

## 2020-10-27 DIAGNOSIS — R7989 Other specified abnormal findings of blood chemistry: Secondary | ICD-10-CM

## 2020-10-27 MED ORDER — ROPINIROLE HCL 1 MG PO TABS: ORAL_TABLET | ORAL | 1 refills | Status: DC

## 2020-10-27 MED ORDER — METHYLPHENIDATE HCL 10 MG PO TABS
30.0000 mg | ORAL_TABLET | Freq: Two times a day (BID) | ORAL | 0 refills | Status: DC
Start: 2020-10-27 — End: 2020-11-03
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
## 2020-10-27 NOTE — Patient Instructions (Signed)
Geriatric MD Dr. Kevan Ny, Dr. Merlene Laughter

## 2020-10-27 NOTE — Telephone Encounter (Signed)
Appt today

## 2020-11-03 ENCOUNTER — Other Ambulatory Visit: Payer: Self-pay

## 2020-11-03 ENCOUNTER — Other Ambulatory Visit (HOSPITAL_COMMUNITY): Payer: Self-pay

## 2020-11-03 ENCOUNTER — Telehealth: Payer: Self-pay | Admitting: Psychiatry

## 2020-11-03 ENCOUNTER — Encounter (INDEPENDENT_AMBULATORY_CARE_PROVIDER_SITE_OTHER): Payer: Self-pay | Admitting: Family Medicine

## 2020-11-03 ENCOUNTER — Ambulatory Visit (INDEPENDENT_AMBULATORY_CARE_PROVIDER_SITE_OTHER): Payer: Medicare Other | Admitting: Family Medicine

## 2020-11-03 VITALS — BP 123/71 | HR 75 | Temp 98.2°F | Ht 71.0 in | Wt 245.0 lb

## 2020-11-03 DIAGNOSIS — E65 Localized adiposity: Secondary | ICD-10-CM | POA: Diagnosis not present

## 2020-11-03 DIAGNOSIS — F331 Major depressive disorder, recurrent, moderate: Secondary | ICD-10-CM

## 2020-11-03 DIAGNOSIS — E8881 Metabolic syndrome: Secondary | ICD-10-CM | POA: Diagnosis not present

## 2020-11-03 DIAGNOSIS — Z6839 Body mass index (BMI) 39.0-39.9, adult: Secondary | ICD-10-CM

## 2020-11-03 DIAGNOSIS — E88819 Insulin resistance, unspecified: Secondary | ICD-10-CM

## 2020-11-03 MED ORDER — SEMAGLUTIDE (2 MG/DOSE) 8 MG/3ML ~~LOC~~ SOPN
2.0000 mg | PEN_INJECTOR | SUBCUTANEOUS | 0 refills | Status: AC
  Filled 2020-11-03: qty 3, 28d supply, fill #0

## 2020-11-03 NOTE — Telephone Encounter (Signed)
Last refill 08/15 Pended for Dr. Jennelle Human to send

## 2020-11-03 NOTE — Telephone Encounter (Signed)
Refill request for Methylphenidate 10 mg #180 Last refill 09/05/20

## 2020-11-03 NOTE — Telephone Encounter (Signed)
Pt called [facility] doesn't have Adderall. Pt called around and [facility] [address] ASAP. Leaving early morning to go out of town.

## 2020-11-04 DIAGNOSIS — E785 Hyperlipidemia, unspecified: Secondary | ICD-10-CM | POA: Diagnosis not present

## 2020-11-04 DIAGNOSIS — Z7982 Long term (current) use of aspirin: Secondary | ICD-10-CM | POA: Diagnosis not present

## 2020-11-04 DIAGNOSIS — I6523 Occlusion and stenosis of bilateral carotid arteries: Secondary | ICD-10-CM | POA: Diagnosis not present

## 2020-11-04 DIAGNOSIS — E669 Obesity, unspecified: Secondary | ICD-10-CM | POA: Diagnosis not present

## 2020-11-04 MED ORDER — METHYLPHENIDATE HCL 10 MG PO TABS: 30.0000 mg | ORAL_TABLET | Freq: Two times a day (BID) | ORAL | 0 refills | Status: DC

## 2020-11-06 ENCOUNTER — Other Ambulatory Visit: Payer: Self-pay | Admitting: Family Medicine

## 2020-11-06 DIAGNOSIS — F422 Mixed obsessional thoughts and acts: Secondary | ICD-10-CM

## 2020-11-06 DIAGNOSIS — F331 Major depressive disorder, recurrent, moderate: Secondary | ICD-10-CM

## 2020-11-08 NOTE — Progress Notes (Signed)
     Chief Complaint:   OBESITY Eric [name] is here to discuss his progress with his obesity treatment plan along with follow-up of his obesity related diagnoses. See Medical Weight Management Flowsheet for complete bioelectrical impedance results.  Today's visit was #: 80 Starting weight: 285 lbs Starting date: 03/15/2020 Weight change since last visit: 0 Total lbs lost to date: 40 lbs Total weight loss percentage to date: -14.04%  Nutrition Plan: Category 3 Meal Plan for 0% of the time. Activity: Walking/strength training for 30-45 minutes 2-3 times per week. Anti-obesity medications: Ozempic 2 mg subcutaneously weekly. Reported side effects: None.  Interim History: Eric [name] says he is going out of town this week and will return on November 9.  He will need another Ozempic pen on November 3.    Assessment/Plan:   1. Insulin resistance, with polyphagia Controlled. Current treatment: Ozempic 2 mg subcutaneously weekly.    Plan:  Continue Ozempic 2 mg subcutaneously weekly.  Will refill today, as per below.  He will continue to focus on protein-rich, low simple carbohydrate foods. We reviewed the importance of hydration, regular exercise for stress reduction, and restorative sleep.  - Refill Semaglutide, 2 MG/DOSE, 8 MG/3ML SOPN; Inject 2 mg as directed once a week.  Dispense: 6 mL; Refill: 0  2. Metabolic syndrome Starting goal MET: Lose 7-10% of starting weight. He will continue to focus on protein-rich, low simple carbohydrate foods. We reviewed the importance of hydration, regular exercise for stress reduction, and restorative sleep.  We will continue to check lab work every 3 months, with 10% weight loss, or should any other concerns arise.  3. Visceral obesity Current visceral fat rating: 21. Visceral fat rating should be < 13. Visceral adipose tissue is a hormonally active component of total body fat. This body composition phenotype is associated with medical disorders such as  metabolic syndrome, cardiovascular disease and several malignancies including prostate, breast, and colorectal cancers. Starting goal: Lose 7-10% of starting weight.   4. Obesity, current BMI of 34.2  Course: Eric [name] is currently in the action stage of change. As such, his goal is to continue with weight loss efforts.   Nutrition goals: He has agreed to the Category 3 Plan.   Exercise goals:  As is.  Behavioral modification strategies: increasing lean protein intake, decreasing simple carbohydrates, increasing vegetables, and increasing water intake.  Eric [name] has agreed to follow-up with our clinic in 4 weeks. He was informed of the importance of frequent follow-up visits to maximize his success with intensive lifestyle modifications for his multiple health conditions.   Objective:   Blood pressure 123/71, pulse 75, temperature 98.2 F (36.8 C), temperature source Oral, height $RemoveBefo'5\' 11"'RleNdtIOlJc$  (1.803 m), weight 245 lb (111.1 kg), SpO2 95 %. Body mass index is 34.17 kg/m.  General: Cooperative, alert, well developed, in no acute distress. HEENT: Conjunctivae and lids unremarkable. Cardiovascular: Regular rhythm.  Lungs: Normal work of breathing. Neurologic: No focal deficits.   Attestation Statements:   Reviewed by clinician on day of visit: allergies, medications, problem list, medical history, surgical history, family history, social history, and previous encounter notes.  I, Water quality scientist, CMA, am acting as transcriptionist for Briscoe Deutscher, DO  I have reviewed the above documentation for accuracy and completeness, and I agree with the above. -  Briscoe Deutscher, DO, MS, FAAFP, DABOM - Family and Bariatric Medicine.

## 2020-11-22 NOTE — Progress Notes (Signed)
Office: [phone]  /  Fax: [phone]    Date: December 06, 2020   Appointment Start Time: 10:02am Duration: 21 minutes Provider: Lawerance Cruel, Psy.D. Type of Session: Individual Therapy  Location of Patient: Home (private location) Location of Provider: Provider's Home (private office) Type of Contact: Telepsychological Visit via MyChart Video Visit  Session Content: Eric [name] is a 70 y.o. male presenting for a follow-up appointment to address the previously established treatment goal of increasing coping skills.Today's appointment was a telepsychological visit due to COVID-19. Eric [name] provided verbal consent for today's telepsychological appointment and he is aware he is responsible for securing confidentiality on his end of the session. Prior to proceeding with today's appointment, Eric [name]'s physical location at the time of this appointment was obtained as well a phone number he could be reached at in the event of technical difficulties. Eric [name] and this provider participated in today's telepsychological service.   This provider conducted a brief check-in. Eric [name] shared he has RSV, noting before he got sick he was in [place] for three weeks visiting family. Discussed eating habits while he was on vacation. Eric [name] shared he ate "a fair amount of ice cream." He acknowledged a decrease in appetite while being sick. He was receptive to eating protein when possible. Reviewed sleep hygiene. He discussed an improvement in sleep.   Psychoeducation regarding triggers for emotional eating was provided. Eric [name] was provided a handout, and encouraged to utilize the handout between now and the next appointment to increase awareness of triggers and frequency. Eric [name] agreed. This provider also discussed behavioral strategies for specific triggers, such as placing the utensil down when conversing to avoid mindless eating. Eric [name] provided verbal consent during today's appointment for this provider to send  a handout about triggers via e-mail. Overall, Eric [name] was receptive to today's appointment as evidenced by openness to sharing, responsiveness to feedback, and willingness to explore triggers for emotional eating.  Mental Status Examination:  Appearance: well groomed and appropriate hygiene  Behavior: appropriate to circumstances Mood: euthymic Affect: mood congruent Speech: normal in rate, volume, and tone Eye Contact: appropriate Psychomotor Activity: appropriate Gait: unable to assess Thought Process: linear, logical, and goal directed  Thought Content/Perception: no hallucinations, delusions, bizarre thinking or behavior reported or observed and no evidence or endorsement of suicidal and homicidal ideation, plan, and intent Orientation: time, person, place, and purpose of appointment Memory/Concentration: memory, attention, language, and fund of knowledge intact  Insight/Judgment: fair  Interventions:  Conducted a brief chart review Provided empathic reflections and validation Reviewed content from the previous session Employed supportive psychotherapy interventions to facilitate reduced distress and to improve coping skills with identified stressors Psychoeducation provided regarding triggers for emotional eating  DSM-5 Diagnosis(es):  F32.89 Other Specified Depressive Disorder, Emotional Eating Behaviors and F42.9 Unspecified Obsessive-Compulsive and Related Disorder  Treatment Goal & Progress: During the initial appointment with this provider, the following treatment goal was established: increase coping skills. Eric [name] has demonstrated progress in his goal as evidenced by increased awareness of hunger patterns. Eric [name] also continues to demonstrate willingness to engage in learned skill(s).  Plan: The next appointment will be scheduled in two weeks, which will be via MyChart Video Visit. The next session will focus on working towards the established treatment goal. Eric [name]'s next  appointment with his psychiatrist (Dr. Jennelle Human) is on January 02, 2021. He was receptive to signing to an authorization for coordination of care for Dr. Jennelle Human when he is next in the office for his appointment with Dr. Earlene Plater (December 20, 2020).

## 2020-11-24 ENCOUNTER — Encounter: Payer: Self-pay | Admitting: Psychiatry

## 2020-11-24 ENCOUNTER — Telehealth (INDEPENDENT_AMBULATORY_CARE_PROVIDER_SITE_OTHER): Payer: Medicare Other | Admitting: Psychiatry

## 2020-11-24 DIAGNOSIS — F331 Major depressive disorder, recurrent, moderate: Secondary | ICD-10-CM | POA: Diagnosis not present

## 2020-11-24 DIAGNOSIS — F900 Attention-deficit hyperactivity disorder, predominantly inattentive type: Secondary | ICD-10-CM

## 2020-11-24 DIAGNOSIS — F422 Mixed obsessional thoughts and acts: Secondary | ICD-10-CM | POA: Diagnosis not present

## 2020-11-24 DIAGNOSIS — R7989 Other specified abnormal findings of blood chemistry: Secondary | ICD-10-CM

## 2020-11-24 DIAGNOSIS — F5221 Male erectile disorder: Secondary | ICD-10-CM

## 2020-11-24 DIAGNOSIS — G4733 Obstructive sleep apnea (adult) (pediatric): Secondary | ICD-10-CM

## 2020-11-24 DIAGNOSIS — G2581 Restless legs syndrome: Secondary | ICD-10-CM

## 2020-11-24 MED ORDER — METHYLPHENIDATE HCL ER (OSM) 54 MG PO TBCR: 54.0000 mg | EXTENDED_RELEASE_TABLET | ORAL | 0 refills | Status: DC

## 2020-11-24 NOTE — Progress Notes (Addendum)
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 have resulted in improved productivity and attention.  Previous screening of memory was not suggestive of any neuro degenerative process. Mini-Mental status exam 28/30 on 10/27/20.  No evidence of dementia.  He has lost pounds on Ozempic. Disc use of this for weight loss.  This will help a number of things including joints.   Option sildenafil .  He wishes to defer.  No problem with the reduction in ropinirole. RLS managed.  Still no complaints. Disc also dx PLMS.  The concern his wife raised about dopamine agonist resulting sometimes in compulsive and impulsive behaviors was discussed at length.  We will attempt to change to gabapentin.  Per his request and wife's concerns about DA agonists: Better with ropinirole than gabapentin bc   Supportive therapy and problem solving around distinguishing decision from OCD.  This volunteering is triggering some anxiety and obsessing but not severely.     Follow-up 4 weeks per pt request  Meredith Staggers MD, DFAPA.  Please see After Visit Summary for patient specific instructions.  Future Appointments  Date Time Provider Department Center  12/06/2020 10:00 AM Cristy Folks, PsyD [facility] None  12/06/2020  2:40 PM Helane Rima, DO [facility] None  12/20/2020 10:00 AM Helane Rima, DO [facility] None  01/02/2021  1:30 PM Cottle, Steva Ready., MD [facility] None  02/02/2021  1:30 PM Cottle, Steva Ready., MD [facility] None    No orders of the defined types were placed in this encounter.     -------------------------------

## 2020-12-03 DIAGNOSIS — B349 Viral infection, unspecified: Secondary | ICD-10-CM | POA: Diagnosis not present

## 2020-12-03 DIAGNOSIS — J029 Acute pharyngitis, unspecified: Secondary | ICD-10-CM | POA: Diagnosis not present

## 2020-12-03 DIAGNOSIS — Z03818 Encounter for observation for suspected exposure to other biological agents ruled out: Secondary | ICD-10-CM | POA: Diagnosis not present

## 2020-12-03 DIAGNOSIS — R058 Other specified cough: Secondary | ICD-10-CM | POA: Diagnosis not present

## 2020-12-06 ENCOUNTER — Other Ambulatory Visit: Payer: Self-pay

## 2020-12-06 ENCOUNTER — Encounter (INDEPENDENT_AMBULATORY_CARE_PROVIDER_SITE_OTHER): Payer: Self-pay | Admitting: Family Medicine

## 2020-12-06 ENCOUNTER — Telehealth (INDEPENDENT_AMBULATORY_CARE_PROVIDER_SITE_OTHER): Payer: Medicare Other | Admitting: Family Medicine

## 2020-12-06 ENCOUNTER — Telehealth (INDEPENDENT_AMBULATORY_CARE_PROVIDER_SITE_OTHER): Payer: Medicare Other | Admitting: Psychology

## 2020-12-06 DIAGNOSIS — F3289 Other specified depressive episodes: Secondary | ICD-10-CM

## 2020-12-06 DIAGNOSIS — E8881 Metabolic syndrome: Secondary | ICD-10-CM | POA: Diagnosis not present

## 2020-12-06 DIAGNOSIS — F429 Obsessive-compulsive disorder, unspecified: Secondary | ICD-10-CM

## 2020-12-06 DIAGNOSIS — E88819 Insulin resistance, unspecified: Secondary | ICD-10-CM

## 2020-12-06 DIAGNOSIS — B338 Other specified viral diseases: Secondary | ICD-10-CM | POA: Diagnosis not present

## 2020-12-06 DIAGNOSIS — Z6839 Body mass index (BMI) 39.0-39.9, adult: Secondary | ICD-10-CM

## 2020-12-06 NOTE — Progress Notes (Signed)
  Office: [phone]  /  Fax: [phone]    Date: December 20, 2020   Appointment Start Time: 2:36pm Duration: 26 minutes Provider: Lawerance Cruel, Psy.D. Type of Session: Individual Therapy  Location of Patient: Home Location of Provider: Provider's Home (private office) Type of Contact: Telepsychological Visit via MyChart Video Visit (switched to phone call)  Session Content:This provider called Eric [name] at 2:32pm as he did not present for today's appointment. A HIPAA compliant voicemail was left requesting a call back. Nyko called the clinic at 2:35pm and indicated MyChart was not allowing him to login. Thus, he provided verbal consent to proceed via telephone for today's appointment. As such, today's appointment was initiated 6 minutes late. Eric [name] is a 70 y.o. male presenting for a follow-up appointment to address the previously established treatment goal of increasing coping skills.Today's appointment was a telepsychological visit due to COVID-19. Eric [name] provided verbal consent for today's telepsychological appointment and he is aware he is responsible for securing confidentiality on his end of the session. Prior to proceeding with today's appointment, Eric [name]'s physical location at the time of this appointment was obtained as well a phone number he could be reached at in the event of technical difficulties. Eric [name] and this provider participated in today's telepsychological service.   This provider conducted a brief check-in. Stepfon reported "I've been feeling alright." He indicated he lost three more pounds. Eric [name] acknowledged he is going long periods without eating resulting in him eating only dinner followed by ice cream. Thus, he was engaged in problem solving to help him eat regularly. Eric [name] discussed his typical day and determined the times he can set alarms on his phone. Eric [name] indicated he did not sign the authorization for this provider to coordinate care with his  psychiatrist (Dr. Jennelle Human) while he was at the clinic today. He provided verbal consent for this provider to e-mail an authorization form for him to complete and return. Overall, Eric [name] was receptive to today's appointment as evidenced by openness to sharing, responsiveness to feedback, and willingness to implement discussed strategies .  Mental Status Examination:  Appearance: unable to assess  Behavior: unable to assess Mood: euthymic Affect: unable to fully assess Speech: normal in rate, volume, and tone Eye Contact: unable to assess Psychomotor Activity: unable to assess  Gait: unable to assess Thought Process: linear, logical, and goal directed  Thought Content/Perception: no hallucinations, delusions, bizarre thinking or behavior reported or observed and no evidence or endorsement of suicidal and homicidal ideation, plan, and intent Orientation: time, person, place, and purpose of appointment Memory/Concentration: memory, attention, language, and fund of knowledge intact  Insight/Judgment: fair  Interventions:  Conducted a brief chart review Provided empathic reflections and validation Employed supportive psychotherapy interventions to facilitate reduced distress and to improve coping skills with identified stressors Engaged patient in problem solving  DSM-5 Diagnosis(es):  F32.89 Other Specified Depressive Disorder, Emotional Eating Behaviors and F42.9 Unspecified Obsessive-Compulsive and Related Disorder  Treatment Goal & Progress: During the initial appointment with this provider, the following treatment goal was established: increase coping skills. Eric [name] has demonstrated progress in his goal as evidenced by increased awareness of hunger patterns and increased awareness of triggers for emotional eating behaviors. Eric [name] also continues to demonstrate willingness to engage in learned skill(s).  Plan: The next appointment will be scheduled in two weeks, which will be via MyChart  Video Visit. The next session will focus on working towards the established treatment goal.

## 2020-12-07 NOTE — Progress Notes (Signed)
    TeleHealth Visit:  Due to the COVID-19 pandemic, this visit was completed with telemedicine (audio/video) technology to reduce patient and provider exposure as well as to preserve personal protective equipment.   Eric [name] has verbally consented to this TeleHealth visit. The patient is located at home, the provider is located at the [facility] office. The participants in this visit include the listed provider and patient. The visit was conducted today via MyChart video.  Chief Complaint: OBESITY Eric [name] is here to discuss his progress with his obesity treatment plan along with follow-up of his obesity related diagnoses. Eric [name] is on the Category 3 Plan and states he is following his eating plan approximately 0% of the time. Eric [name] states he is not exercising at this time.  Today's visit was #: 18 Starting weight: 285 lbs Starting date: 03/15/2020  Interim History: Eric [name] tested positive for RSV at [facility].  He is taking guaifenesin.  He has a prescription that he is waiting to pick up.  Will send in 2 refills.  He is happy that he maintained.  He says that ice cream is still his vice.    Assessment/Plan:   1. Insulin resistance, with polyphagia Goal is HgbA1c < 5.7, fasting insulin closer to 5.  Medication: Ozempic 2 mg subcutaneously weekly.    Plan:  Continue Ozempic 2 mg subcutaneously weekly.  He will continue to focus on protein-rich, low simple carbohydrate foods. We reviewed the importance of hydration, regular exercise for stress reduction, and restorative sleep.   2. RSV (respiratory syncytial virus infection) Eric [name] is taking guaifenesin for RSV.  He recently test positive at is PCPs office. The patient understands monitoring parameters and red flags.   3. Emotional eating behaviors Controlled. Medication: Wellbutrin XL 450 mg daily and Lexapro 20 mg daily.   Plan: Continue Wellbutrin and Lexapro.  Discussed cues and consequences, how thoughts affect eating,  model of thoughts, feelings, and behaviors, and strategies for change by focusing on the cue. Discussed cognitive distortions, coping thoughts, and how to change your thoughts.  4. Obesity, current BMI of 34.2  Eric [name] is currently in the action stage of change. As such, his goal is to continue with weight loss efforts. He has agreed to practicing portion control and making smarter food choices, such as increasing vegetables and decreasing simple carbohydrates.   Exercise goals:  As tolerated.  Behavioral modification strategies: increasing lean protein intake, decreasing simple carbohydrates, increasing vegetables, increasing water intake, and emotional eating strategies.  Eric [name] has agreed to follow-up with our clinic in 2 weeks. He was informed of the importance of frequent follow-up visits to maximize his success with intensive lifestyle modifications for his multiple health conditions.  Objective:   VITALS: Per patient if applicable, see vitals. GENERAL: Alert and in no acute distress. CARDIOPULMONARY: No increased WOB. Speaking in clear sentences.  PSYCH: Pleasant and cooperative. Speech normal rate and rhythm. Affect is appropriate. Insight and judgement are appropriate. Attention is focused, linear, and appropriate.  NEURO: Oriented as arrived to appointment on time with no prompting.   Attestation Statements:   Reviewed by clinician on day of visit: allergies, medications, problem list, medical history, surgical history, family history, social history, and previous encounter notes.  I, Insurance claims handler, CMA, am acting as transcriptionist for Helane Rima, DO   I have reviewed the above documentation for accuracy and completeness, and I agree with the above. - Helane Rima, DO, MS, FAAFP, DABOM - Family and Bariatric Medicine.

## 2020-12-11 ENCOUNTER — Other Ambulatory Visit: Payer: Self-pay | Admitting: Psychiatry

## 2020-12-20 ENCOUNTER — Telehealth: Payer: Self-pay | Admitting: Psychiatry

## 2020-12-20 ENCOUNTER — Ambulatory Visit (INDEPENDENT_AMBULATORY_CARE_PROVIDER_SITE_OTHER): Payer: Medicare Other | Admitting: Family Medicine

## 2020-12-20 ENCOUNTER — Encounter (INDEPENDENT_AMBULATORY_CARE_PROVIDER_SITE_OTHER): Payer: Self-pay | Admitting: Family Medicine

## 2020-12-20 ENCOUNTER — Telehealth (INDEPENDENT_AMBULATORY_CARE_PROVIDER_SITE_OTHER): Payer: Medicare Other | Admitting: Psychology

## 2020-12-20 ENCOUNTER — Other Ambulatory Visit: Payer: Self-pay

## 2020-12-20 VITALS — BP 122/76 | HR 68 | Temp 97.7°F | Ht 71.0 in | Wt 242.0 lb

## 2020-12-20 DIAGNOSIS — F3289 Other specified depressive episodes: Secondary | ICD-10-CM

## 2020-12-20 DIAGNOSIS — Z6839 Body mass index (BMI) 39.0-39.9, adult: Secondary | ICD-10-CM

## 2020-12-20 DIAGNOSIS — E8881 Metabolic syndrome: Secondary | ICD-10-CM | POA: Diagnosis not present

## 2020-12-20 DIAGNOSIS — F429 Obsessive-compulsive disorder, unspecified: Secondary | ICD-10-CM

## 2020-12-20 DIAGNOSIS — F900 Attention-deficit hyperactivity disorder, predominantly inattentive type: Secondary | ICD-10-CM

## 2020-12-20 NOTE — Telephone Encounter (Signed)
Eric [name] called because he saw that one of his medications is Concerta.  Though as he reported he is not taking this medication anymore.  He is wondering why a prescription was submitted for this medication.  Please call to discuss.  Do you feel he should be taking this medication?

## 2020-12-20 NOTE — Telephone Encounter (Signed)
Pt did not pick up rx sent on 11/3.He stated you wanted to see his B12 and TSH levels first and he sent those to you.Do you want him to take the med?

## 2020-12-20 NOTE — Telephone Encounter (Signed)
LVM to rtc 

## 2020-12-20 NOTE — Progress Notes (Unsigned)
  Office: [phone]  /  Fax: [phone]    Date: January 03, 2021   Appointment Start Time: *** Duration: *** minutes Provider: Lawerance Cruel, Psy.D. Type of Session: Individual Therapy  Location of Patient: {gbptloc:23249} Location of Provider: Provider's Home (private office) Type of Contact: Telepsychological Visit via MyChart Video Visit  Session Content: Eric [name] is a 70 y.o. male presenting for a follow-up appointment to address the previously established treatment goal of increasing coping skills.Today's appointment was a telepsychological visit due to COVID-19. [name] provided verbal consent for today's telepsychological appointment and he is aware he is responsible for securing confidentiality on his end of the session. Prior to proceeding with today's appointment, Amadu's physical location at the time of this appointment was obtained as well a phone number he could be reached at in the event of technical difficulties. [name] and this provider participated in today's telepsychological service.   This provider conducted a brief check-in. *** [name] was receptive to today's appointment as evidenced by openness to sharing, responsiveness to feedback, and {gbreceptiveness:23401}.  Mental Status Examination:  Appearance: {Appearance:22431} Behavior: {Behavior:22445} Mood: {gbmood:21757} Affect: {Affect:22436} Speech: {Speech:22432} Eye Contact: {Eye Contact:22433} Psychomotor Activity: {Motor Activity:22434} Gait: {gbgait:23404} Thought Process: {thought process:22448}  Thought Content/Perception: {disturbances:22451} Orientation: {Orientation:22437} Memory/Concentration: {gbcognition:22449} Insight/Judgment: {Insight:22446}  Interventions:  {Interventions for Progress Notes:23405}  DSM-5 Diagnosis(es):  F32.89 Other Specified Depressive Disorder, Emotional Eating Behaviors and F42.9 Unspecified Obsessive-Compulsive and Related Disorder  Treatment Goal & Progress:  During the initial appointment with this provider, the following treatment goal was established: increase coping skills. [name] has demonstrated progress in his goal as evidenced by {gbtxprogress:22839}. [name] also {gbtxprogress2:22951}.  Plan: The next appointment will be scheduled in {gbweeks:21758}, which will be {gbtxmodality:23402}. The next session will focus on {Plan for Next Appointment:23400}.

## 2020-12-21 NOTE — Telephone Encounter (Signed)
Prior Authorization submitted and approved for METHYLPHENIDATE ER TBCR 54 MG effective 12/21/2020-12/21/2021 with [employer] Medicare ID# [id number]

## 2020-12-21 NOTE — Telephone Encounter (Signed)
Pt would like for you to send order to [facility] and wants to know should he fast and what time of day he should go

## 2020-12-21 NOTE — Progress Notes (Signed)
Chief Complaint:   OBESITY Eric [name] is here to discuss his progress with his obesity treatment plan along with follow-up of his obesity related diagnoses. See Medical Weight Management Flowsheet for complete bioelectrical impedance results.  Today's visit was #: 19 Starting weight: 285 lbs Starting date: 03/15/2020 Weight change since last visit: 3 lbs Total lbs lost to date: 43 lbs Total weight loss percentage to date: -15.09%  Nutrition Plan: Practicing portion control and making smarter food choices, such as increasing vegetables and decreasing simple carbohydrates for 0% of the time.  Activity: Working with a Psychologist, educational for 30 minutes 3 times per week. Anti-obesity medications: Ozempic 2 mg subcutaneously weekly. Reported side effects: None.  Interim History: Eric [name] is working with Dr. Jennelle Human, his psychiatrist, on impulsive eating.  He is adjusting stimulant medication.  He is also working with Dr. Dewaine Conger.  He says he has decreased his ice cream intake.  Assessment/Plan:   1. Insulin resistance, with polyphagia Goal is HgbA1c < 5.7, fasting insulin closer to 5.  Medication: Ozempic 2 mg subcutaneously weekly.    Plan:  Continue Ozempic 2 mg subcutaneously weekly.  He will continue to focus on protein-rich, low simple carbohydrate foods. We reviewed the importance of hydration, regular exercise for stress reduction, and restorative sleep.   2. Attention deficit hyperactivity disorder (ADHD), predominantly inattentive type Followed by Dr. Jennelle Human.  Transitioning to Concerta 54 mg daily.   Counseling ADHD is the most missed diagnosis in relation to food and appetite problems. Often the strong urge to binge or to self-medicate with food subsides once the impulsivity and inattention of ADHD are treated. A person can experience a new ability to tune in to the body's signals, control cravings and improve impulse control.  A deficiency in norepinephrine and dopamine can lead to the  following behaviors related to eating: Poor awareness of internal cues of hunger and satiety, or fullness. Inability to follow a meal plan. Inability to judge portion size accurately. Inability to stop bingeing or purging. Distraction by continual thoughts of food, weight and body shape. Increased desire to overeat, especially high calorie, "reward" type foods. Poor self-esteem due to repeated failures of self-control.  Plan: We will continue to monitor symptoms as they relate to his weight loss journey.  3. Emotional eating behaviors Improving, but not optimized. Medication: Wellbutrin XL 450 mg daily, Lexapro 20 mg daily.   Plan:  Continue to work with Dr. Jennelle Human and Dr. Dewaine Conger.  Discussed cues and consequences, how thoughts affect eating, model of thoughts, feelings, and behaviors, and strategies for change by focusing on the cue. Discussed cognitive distortions, coping thoughts, and how to change your thoughts.  4. Obesity, current BMI of 33.8  Course: Eric [name] is currently in the action stage of change. As such, his goal is to continue with weight loss efforts.   Nutrition goals: He has agreed to practicing portion control and making smarter food choices, such as increasing vegetables and decreasing simple carbohydrates.   Exercise goals:  As is.  Behavioral modification strategies: increasing lean protein intake, decreasing simple carbohydrates, increasing vegetables, increasing water intake, and emotional eating strategies.  Eric [name] has agreed to follow-up with our clinic in 4 weeks. He was informed of the importance of frequent follow-up visits to maximize his success with intensive lifestyle modifications for his multiple health conditions.   Objective:   Blood pressure 122/76, pulse 68, temperature 97.7 F (36.5 C), height 5\' 11"  (1.803 m), weight 242 lb (109.8 kg), SpO2 95 %.  Body mass index is 33.75 kg/m.  General: Cooperative, alert, well developed, in no acute  distress. HEENT: Conjunctivae and lids unremarkable. Cardiovascular: Regular rhythm.  Lungs: Normal work of breathing. Neurologic: No focal deficits.   Attestation Statements:   Reviewed by clinician on day of visit: allergies, medications, problem list, medical history, surgical history, family history, social history, and previous encounter notes.  I, Insurance claims handler, CMA, am acting as transcriptionist for Helane Rima, DO  I have reviewed the above documentation for accuracy and completeness, and I agree with the above. -  Helane Rima, DO, MS, FAAFP, DABOM - Family and Bariatric Medicine.

## 2020-12-21 NOTE — Telephone Encounter (Signed)
LVM to rtc 

## 2020-12-21 NOTE — Telephone Encounter (Signed)
Reviewed labs from his primary care doctor.  The last vitamin B12 level was in 2019 which is too old to be useful.  That should probably be repeated. his last TSH was normal in January 2022.  It is likely that that is still normal as well but probably should be repeated to be sure.  There is also the opinion of his primary care doctor. I can write the order for these lab tests or he can have PCP do it.  The decision to try Concerta in place of regular Ritalin was independent of the desire to get the laboratory tests.  Concerta is long-acting Ritalin.  The purpose of trying the Concerta was to see if he could have better energy, focus, productivity throughout the day and perhaps better concentration and memory.  I think he should still try the Concerta in place of the Ritalin that he has been taking.  They have the same active ingredient

## 2020-12-22 ENCOUNTER — Other Ambulatory Visit: Payer: Self-pay | Admitting: Psychiatry

## 2020-12-22 DIAGNOSIS — G3184 Mild cognitive impairment, so stated: Secondary | ICD-10-CM

## 2020-12-22 DIAGNOSIS — F331 Major depressive disorder, recurrent, moderate: Secondary | ICD-10-CM | POA: Diagnosis not present

## 2020-12-22 NOTE — Telephone Encounter (Signed)
Pt informed

## 2020-12-22 NOTE — Telephone Encounter (Signed)
OK .  Sent to [facility].  No fasting required and any time of day for the test.

## 2020-12-23 LAB — TSH: TSH: 1.56 u[IU]/mL (ref 0.450–4.500)

## 2020-12-23 LAB — B12 AND FOLATE PANEL
Folate: 12.4 ng/mL (ref >3.0)
Vitamin B-12: 1580 pg/mL — ABNORMAL HIGH (ref 232–1245)

## 2020-12-23 NOTE — Progress Notes (Signed)
Please let him know that his thyroid test, vitamin B12 and folate levels are normal, so no changes are needed.  He should try the Concerta that I RX (I.e, methylphenidate ER).

## 2020-12-26 NOTE — Progress Notes (Signed)
Patient notified of results and recommendations, expressed understanding.

## 2021-01-02 ENCOUNTER — Encounter: Payer: Self-pay | Admitting: Psychiatry

## 2021-01-02 ENCOUNTER — Ambulatory Visit (INDEPENDENT_AMBULATORY_CARE_PROVIDER_SITE_OTHER): Payer: Medicare Other | Admitting: Psychiatry

## 2021-01-02 ENCOUNTER — Other Ambulatory Visit: Payer: Self-pay

## 2021-01-02 DIAGNOSIS — F331 Major depressive disorder, recurrent, moderate: Secondary | ICD-10-CM

## 2021-01-02 DIAGNOSIS — F422 Mixed obsessional thoughts and acts: Secondary | ICD-10-CM | POA: Diagnosis not present

## 2021-01-02 DIAGNOSIS — F5221 Male erectile disorder: Secondary | ICD-10-CM

## 2021-01-02 DIAGNOSIS — R7989 Other specified abnormal findings of blood chemistry: Secondary | ICD-10-CM

## 2021-01-02 DIAGNOSIS — G2581 Restless legs syndrome: Secondary | ICD-10-CM

## 2021-01-02 DIAGNOSIS — F900 Attention-deficit hyperactivity disorder, predominantly inattentive type: Secondary | ICD-10-CM | POA: Diagnosis not present

## 2021-01-02 DIAGNOSIS — G4733 Obstructive sleep apnea (adult) (pediatric): Secondary | ICD-10-CM

## 2021-01-02 MED ORDER — METHYLPHENIDATE HCL ER (OSM) 36 MG PO TBCR: 72.0000 mg | EXTENDED_RELEASE_TABLET | ORAL | 0 refills | Status: DC

## 2021-01-02 NOTE — Progress Notes (Signed)
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 PLMS.  The concern his wife raised about dopamine agonist resulting sometimes in compulsive and impulsive behaviors was discussed at length.  We will attempt to change to gabapentin.  Per his request and wife's concerns about DA agonists: Better with ropinirole than gabapentin bc   Supportive therapy and problem solving around distinguishing decision from OCD.  This volunteering is triggering some anxiety and obsessing but not severely.    Follow-up 4 weeks per pt request  Meredith Staggers MD, DFAPA.  Please see After Visit Summary for patient specific instructions.  Future Appointments  Date Time Provider Department Center  01/03/2021  2:30 PM Cristy Folks, PsyD [facility] None  01/25/2021  3:00 PM Helane Rima, DO [facility] None  02/02/2021  1:30 PM Cottle, Steva Ready., MD [facility] None  02/08/2021 11:00 AM Helane Rima, DO [facility] None    No orders of the defined types  were placed in this encounter.     -------------------------------

## 2021-01-03 ENCOUNTER — Encounter (INDEPENDENT_AMBULATORY_CARE_PROVIDER_SITE_OTHER): Payer: Self-pay

## 2021-01-03 ENCOUNTER — Other Ambulatory Visit: Payer: Self-pay | Admitting: Psychiatry

## 2021-01-03 ENCOUNTER — Other Ambulatory Visit (INDEPENDENT_AMBULATORY_CARE_PROVIDER_SITE_OTHER): Payer: Self-pay | Admitting: Family Medicine

## 2021-01-03 ENCOUNTER — Telehealth (INDEPENDENT_AMBULATORY_CARE_PROVIDER_SITE_OTHER): Payer: Medicare Other | Admitting: Psychology

## 2021-01-03 DIAGNOSIS — E8881 Metabolic syndrome: Secondary | ICD-10-CM

## 2021-01-03 NOTE — Telephone Encounter (Signed)
Pt last seen by Dr. Wallace.  

## 2021-01-03 NOTE — Telephone Encounter (Signed)
Msg sent to pt 

## 2021-01-04 ENCOUNTER — Other Ambulatory Visit (INDEPENDENT_AMBULATORY_CARE_PROVIDER_SITE_OTHER): Payer: Self-pay | Admitting: Family Medicine

## 2021-01-04 DIAGNOSIS — E8881 Metabolic syndrome: Secondary | ICD-10-CM

## 2021-01-05 NOTE — Telephone Encounter (Signed)
Pt last seen by Dr. Wallace.  

## 2021-01-10 DIAGNOSIS — G4733 Obstructive sleep apnea (adult) (pediatric): Secondary | ICD-10-CM | POA: Diagnosis not present

## 2021-01-25 ENCOUNTER — Encounter (INDEPENDENT_AMBULATORY_CARE_PROVIDER_SITE_OTHER): Payer: Self-pay | Admitting: Family Medicine

## 2021-01-25 ENCOUNTER — Ambulatory Visit (INDEPENDENT_AMBULATORY_CARE_PROVIDER_SITE_OTHER): Payer: Medicare Other | Admitting: Family Medicine

## 2021-01-25 ENCOUNTER — Other Ambulatory Visit: Payer: Self-pay

## 2021-01-25 VITALS — BP 120/68 | HR 78 | Temp 97.7°F | Ht 71.0 in | Wt 249.0 lb

## 2021-01-25 DIAGNOSIS — E65 Localized adiposity: Secondary | ICD-10-CM

## 2021-01-25 DIAGNOSIS — E88819 Insulin resistance, unspecified: Secondary | ICD-10-CM

## 2021-01-25 DIAGNOSIS — R5382 Chronic fatigue, unspecified: Secondary | ICD-10-CM

## 2021-01-25 DIAGNOSIS — E8881 Metabolic syndrome: Secondary | ICD-10-CM

## 2021-01-25 DIAGNOSIS — Z6839 Body mass index (BMI) 39.0-39.9, adult: Secondary | ICD-10-CM

## 2021-01-25 MED ORDER — SEMAGLUTIDE (2 MG/DOSE) 8 MG/3ML ~~LOC~~ SOPN: 2.0000 mg | PEN_INJECTOR | SUBCUTANEOUS | 1 refills | Status: DC

## 2021-01-26 NOTE — Progress Notes (Signed)
Chief Complaint:   OBESITY Eric [name] is here to discuss his progress with his obesity treatment plan along with follow-up of his obesity related diagnoses. See Medical Weight Management Flowsheet for complete bioelectrical impedance results.  Today's visit was #: 20 Starting weight: 285 lbs Starting date: 03/15/2020 Weight change since last visit: +7 lbs Total lbs lost to date: 36 lbs Total weight loss percentage to date: -12.63%  Nutrition Plan: Practicing portion control and making smarter food choices, such as increasing vegetables and decreasing simple carbohydrates for 0% of the time. Activity: Walking/strength training for 20-40 minutes 2-4 times per week.  Anti-obesity medications: Ozempic 2 mg subcutaneously weekly. Reported side effects: None.  Interim History: Eric [name]'s highest weight was 290 pounds.  He says he overindulged over the holidays.  Reports feeling chronically fatigued.  He says he sleeps 10-12 hours per day and exercises regularly.  He is working on his medications with Dr. Jennelle [name].  Assessment/Plan:   1. Metabolic syndrome Starting goal: Lose 7-10% of starting weight. He will continue to focus on protein-rich, low simple carbohydrate foods. We reviewed the importance of hydration, regular exercise for stress reduction, and restorative sleep.  We will continue to check lab work every 3 months, with 10% weight loss, or should any other concerns arise.  2. Visceral obesity Current visceral fat rating: 22. Visceral fat rating goal is < 13. Visceral adipose tissue is a hormonally active component of total body fat. This body composition phenotype is associated with medical disorders such as metabolic syndrome, cardiovascular disease, and several malignancies including prostate, breast, and colorectal cancers.   3. Insulin resistance, with polyphagia Goal is HgbA1c < 5.7, fasting insulin closer to 5.  Medication: Ozempic 2 mg subcutaneously weekly.    Plan:  He will  continue to focus on protein-rich, low simple carbohydrate foods. We reviewed the importance of hydration, regular exercise for stress reduction, and restorative sleep.   - Refill Semaglutide, 2 MG/DOSE, 8 MG/3ML SOPN; Inject 2 mg as directed once a week.  Dispense: 9 mL; Refill: 1  4. Chronic fatigue He is working on medication adjustments with Dr. Jennelle [name].   5. Obesity, current BMI of 34.8  Course: Eric [name] is currently in the action stage of change. As such, his goal is to continue with weight loss efforts.   Nutrition goals: He has agreed to practicing portion control and making smarter food choices, such as increasing vegetables and decreasing simple carbohydrates.   Exercise goals:  As is.  Behavioral modification strategies: increasing lean protein intake, decreasing simple carbohydrates, increasing vegetables, and increasing water intake.  Eric [name] has agreed to follow-up with our clinic in 4 weeks. He was informed of the importance of frequent follow-up visits to maximize his success with intensive lifestyle modifications for his multiple health conditions.   Objective:   Blood pressure 120/68, pulse 78, temperature 97.7 F (36.5 C), temperature source Oral, height 5\' 11"  (1.803 m), weight 249 lb (112.9 kg), SpO2 95 %. Body mass index is 34.73 kg/m.  General: Cooperative, alert, well developed, in no acute distress. HEENT: Conjunctivae and lids unremarkable. Cardiovascular: Regular rhythm.  Lungs: Normal work of breathing. Neurologic: No focal deficits.   Lab Results  Component Value Date   TSH 1.560 12/22/2020   Attestation Statements:   Reviewed by clinician on day of visit: allergies, medications, problem list, medical history, surgical history, family history, social history, and previous encounter notes.  I, 14/01/2020, CMA, am acting as Insurance claims handler for Energy manager, DO  I have reviewed the above documentation for accuracy and completeness, and I agree with  the above. -  Helane Rima, DO, MS, FAAFP, DABOM - Family and Bariatric Medicine.

## 2021-01-28 ENCOUNTER — Other Ambulatory Visit: Payer: Self-pay | Admitting: Psychiatry

## 2021-01-28 DIAGNOSIS — F331 Major depressive disorder, recurrent, moderate: Secondary | ICD-10-CM

## 2021-02-02 ENCOUNTER — Other Ambulatory Visit: Payer: Self-pay

## 2021-02-02 ENCOUNTER — Encounter: Payer: Self-pay | Admitting: Psychiatry

## 2021-02-02 ENCOUNTER — Ambulatory Visit (INDEPENDENT_AMBULATORY_CARE_PROVIDER_SITE_OTHER): Payer: Medicare Other | Admitting: Psychiatry

## 2021-02-02 VITALS — BP 137/75 | HR 70

## 2021-02-02 DIAGNOSIS — F422 Mixed obsessional thoughts and acts: Secondary | ICD-10-CM | POA: Diagnosis not present

## 2021-02-02 DIAGNOSIS — F5221 Male erectile disorder: Secondary | ICD-10-CM

## 2021-02-02 DIAGNOSIS — R7989 Other specified abnormal findings of blood chemistry: Secondary | ICD-10-CM

## 2021-02-02 DIAGNOSIS — F900 Attention-deficit hyperactivity disorder, predominantly inattentive type: Secondary | ICD-10-CM

## 2021-02-02 DIAGNOSIS — G3184 Mild cognitive impairment, so stated: Secondary | ICD-10-CM

## 2021-02-02 DIAGNOSIS — F331 Major depressive disorder, recurrent, moderate: Secondary | ICD-10-CM

## 2021-02-02 DIAGNOSIS — G2581 Restless legs syndrome: Secondary | ICD-10-CM | POA: Diagnosis not present

## 2021-02-02 DIAGNOSIS — G4733 Obstructive sleep apnea (adult) (pediatric): Secondary | ICD-10-CM

## 2021-02-02 MED ORDER — AMPHETAMINE-DEXTROAMPHET ER 30 MG PO CP24: 30.0000 mg | ORAL_CAPSULE | Freq: Every day | ORAL | 0 refills | Status: DC

## 2021-02-02 NOTE — Progress Notes (Signed)
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 OCD bc often counting in the in the background at rest which tends to reduce his attention.  Ritalin is being successfully used off label to augment antidepressants for depression and have resulted in improved productivity and attention.  Previous screening of memory was not suggestive of any neuro degenerative process. Mini-Mental status exam 28/30 on 10/27/20.  No evidence of dementia.  He has lost pounds on Ozempic. Disc use of this for weight loss.  This will help a number of things including joints.   Option sildenafil .  He wishes to defer.  No problem with the reduction in ropinirole. RLS managed.  Still no complaints. Disc also dx PLMS.  The concern his wife raised about dopamine agonist resulting sometimes in compulsive and impulsive behaviors was discussed at length.  We will attempt to change to gabapentin.  Per his request and wife's concerns about DA agonists: Better  with ropinirole than gabapentin bc   Supportive therapy and problem solving around distinguishing decision from OCD.  This volunteering is triggering some anxiety and obsessing but not severely.    Started seeing [facility] counseling again.  Follow-up 4 weeks per pt request  Lynder Parents MD, DFAPA.  Please see After Visit Summary for patient specific instructions.  Future Appointments  Date Time Provider [place]  02/21/2021  2:00 PM Briscoe Deutscher, DO [facility] None  03/07/2021  1:30 PM Cottle, Billey Co., MD [facility] None  03/21/2021  3:20 PM Briscoe Deutscher, DO [facility] None    No orders of the defined types were placed in this encounter.      -------------------------------

## 2021-02-08 ENCOUNTER — Ambulatory Visit (INDEPENDENT_AMBULATORY_CARE_PROVIDER_SITE_OTHER): Payer: Medicare Other | Admitting: Family Medicine

## 2021-02-21 ENCOUNTER — Ambulatory Visit (INDEPENDENT_AMBULATORY_CARE_PROVIDER_SITE_OTHER): Payer: Medicare Other | Admitting: Family Medicine

## 2021-02-22 ENCOUNTER — Other Ambulatory Visit: Payer: Self-pay | Admitting: Psychiatry

## 2021-02-22 DIAGNOSIS — F331 Major depressive disorder, recurrent, moderate: Secondary | ICD-10-CM

## 2021-02-24 ENCOUNTER — Telehealth: Payer: Self-pay | Admitting: Psychiatry

## 2021-02-24 NOTE — Telephone Encounter (Signed)
I don't see it on med list but he stated he is taking ritalin 10 mg.He also stated adderall XR is giving him sexual side effects,he stated it suppresses his sexual energy.He wants to go back to IR adderall or try something else like vyvanse or Strattera.

## 2021-02-24 NOTE — Telephone Encounter (Signed)
Pt called requesting a RF on Ritalin. Also, he knows CC is in office and not with Pt's today. He has question he needs to talk to him for a brief moment. Pt was advised message would be sent [phone].

## 2021-02-27 ENCOUNTER — Other Ambulatory Visit: Payer: Self-pay

## 2021-02-27 ENCOUNTER — Encounter (INDEPENDENT_AMBULATORY_CARE_PROVIDER_SITE_OTHER): Payer: Self-pay | Admitting: Family Medicine

## 2021-02-27 ENCOUNTER — Ambulatory Visit (INDEPENDENT_AMBULATORY_CARE_PROVIDER_SITE_OTHER): Payer: Medicare Other | Admitting: Family Medicine

## 2021-02-27 ENCOUNTER — Other Ambulatory Visit: Payer: Self-pay | Admitting: Psychiatry

## 2021-02-27 VITALS — BP 117/70 | HR 75 | Temp 98.1°F | Ht 71.0 in | Wt 243.0 lb

## 2021-02-27 DIAGNOSIS — Z6833 Body mass index (BMI) 33.0-33.9, adult: Secondary | ICD-10-CM

## 2021-02-27 DIAGNOSIS — R632 Polyphagia: Secondary | ICD-10-CM | POA: Diagnosis not present

## 2021-02-27 DIAGNOSIS — F331 Major depressive disorder, recurrent, moderate: Secondary | ICD-10-CM

## 2021-02-27 DIAGNOSIS — E8881 Metabolic syndrome: Secondary | ICD-10-CM | POA: Diagnosis not present

## 2021-02-27 DIAGNOSIS — Z6839 Body mass index (BMI) 39.0-39.9, adult: Secondary | ICD-10-CM

## 2021-02-27 DIAGNOSIS — E669 Obesity, unspecified: Secondary | ICD-10-CM | POA: Diagnosis not present

## 2021-02-27 MED ORDER — METHYLPHENIDATE HCL 10 MG PO TABS: 30.0000 mg | ORAL_TABLET | Freq: Two times a day (BID) | ORAL | 0 refills | Status: DC

## 2021-02-27 NOTE — Telephone Encounter (Signed)
We have tried Adderall XR and Concerta.  We should return to Ritalin as he was taking it before until his appointment and we can discuss alternatives at that time.  I will send Ritalin RX

## 2021-02-28 ENCOUNTER — Encounter (INDEPENDENT_AMBULATORY_CARE_PROVIDER_SITE_OTHER): Payer: Self-pay | Admitting: Family Medicine

## 2021-02-28 NOTE — Progress Notes (Signed)
° ° ° °  Chief Complaint:   OBESITY Eric [name] is here to discuss his progress with his obesity treatment plan along with follow-up of his obesity related diagnoses. Eric [name] is on practicing portion control and making smarter food choices, such as increasing vegetables and decreasing simple carbohydrates and states he is following his eating plan approximately 40% of the time. Eric [name] states he is walking, weight training, and pilates for 30-45 minutes 4-5 times per week.  Today's visit was #: 21 Starting weight: 285 lbs Starting date: 03/15/2020 Today's weight: 243 lbs Today's date: 02/27/2021 Total lbs lost to date: 42 lbs Total lbs lost since last in-office visit: 6 lbs  Interim History: Eric [name] is working on eating more protein. He tends to eat 2-3 meals per day. He thinks he is supposed to get 40 grams of protein per day. He and his wife cook and they mostly eat at home. He has occasional juice but no sweet tea.   Subjective:   1. Insulin resistance, with polyphagia Eric [name] is on Ozempic 2 mg. He does not feel that it helps as much as it used to. He denies nausea. He has constipation, but this is chronic. Eric [name]'s las A1C was 5.5 on 09/06/2020.  Assessment/Plan:   1. Insulin resistance, with polyphagia  Eric [name] will continue Ozempic.   2. Obesity, current BMI of 33.91 Eric [name] is currently in the action stage of change. As such, his goal is to continue with weight loss efforts. He has agreed to practicing portion control and making smarter food choices, such as increasing vegetables and decreasing simple carbohydrates (80-100 grams of protein per day)  We discussed ways to increase protein.  We discussed that his protein intake should be 80 to 100 g/day and he will work on attaining this.  Exercise goals:  As is.  Behavioral modification strategies: increasing lean protein intake.  Eric [name] has agreed to follow-up with our clinic in 3 weeks with Dr. Juleen [name].  Objective:   Blood  pressure 117/70, pulse 75, temperature 98.1 F (36.7 C), height 5\' 11"  (1.803 m), weight 243 lb (110.2 kg), SpO2 98 %. Body mass index is 33.89 kg/m.  General: Cooperative, alert, well developed, in no acute distress. HEENT: Conjunctivae and lids unremarkable. Cardiovascular: Regular rhythm.  Lungs: Normal work of breathing. Neurologic: No focal deficits.   No results found for: CREATININE, BUN, NA, K, CL, CO2 No results found for: ALT, AST, GGT, ALKPHOS, BILITOT No results found for: HGBA1C No results found for: INSULIN Lab Results  Component Value Date   TSH 1.560 12/22/2020   No results found for: CHOL, HDL, LDLCALC, LDLDIRECT, TRIG, CHOLHDL No results found for: VD25OH No results found for: WBC, HGB, HCT, MCV, PLT No results found for: IRON, TIBC, FERRITIN  Attestation Statements:   Reviewed by clinician on day of visit: allergies, medications, problem list, medical history, surgical history, family history, social history, and previous encounter notes.  I, Eric [name], RMA, am acting as Location manager for [employer], [place].  I have reviewed the above documentation for accuracy and completeness, and I agree with the above. -  Eric Fick, FNP

## 2021-02-28 NOTE — Telephone Encounter (Signed)
LVM with info

## 2021-03-03 ENCOUNTER — Other Ambulatory Visit: Payer: Self-pay | Admitting: Psychiatry

## 2021-03-03 DIAGNOSIS — F331 Major depressive disorder, recurrent, moderate: Secondary | ICD-10-CM

## 2021-03-07 ENCOUNTER — Ambulatory Visit (INDEPENDENT_AMBULATORY_CARE_PROVIDER_SITE_OTHER): Payer: Medicare Other | Admitting: Psychiatry

## 2021-03-07 ENCOUNTER — Encounter: Payer: Self-pay | Admitting: Psychiatry

## 2021-03-07 ENCOUNTER — Other Ambulatory Visit: Payer: Self-pay

## 2021-03-07 VITALS — BP 135/81 | HR 78

## 2021-03-07 DIAGNOSIS — G4733 Obstructive sleep apnea (adult) (pediatric): Secondary | ICD-10-CM

## 2021-03-07 DIAGNOSIS — G3184 Mild cognitive impairment, so stated: Secondary | ICD-10-CM

## 2021-03-07 DIAGNOSIS — F422 Mixed obsessional thoughts and acts: Secondary | ICD-10-CM

## 2021-03-07 DIAGNOSIS — F900 Attention-deficit hyperactivity disorder, predominantly inattentive type: Secondary | ICD-10-CM | POA: Diagnosis not present

## 2021-03-07 DIAGNOSIS — G2581 Restless legs syndrome: Secondary | ICD-10-CM | POA: Diagnosis not present

## 2021-03-07 DIAGNOSIS — F331 Major depressive disorder, recurrent, moderate: Secondary | ICD-10-CM | POA: Diagnosis not present

## 2021-03-07 DIAGNOSIS — R7989 Other specified abnormal findings of blood chemistry: Secondary | ICD-10-CM

## 2021-03-07 MED ORDER — AMPHETAMINE-DEXTROAMPHETAMINE 20 MG PO TABS: 20.0000 mg | ORAL_TABLET | Freq: Every day | ORAL | 0 refills | Status: DC

## 2021-03-07 NOTE — Progress Notes (Signed)
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 off label to augment antidepressants for depression and have resulted in improved productivity and attention.  Previous screening of memory was not suggestive of any neuro degenerative process. Mini-Mental status exam 28/30 on 10/27/20.  No evidence of dementia.  He has lost 47 # pounds on Ozempic. Disc use of this for weight loss.  This will help a number of things including joints.   Option sildenafil .  He wishes to defer.  No problem with the reduction in ropinirole. RLS managed.  Still no complaints. Disc also dx PLMS.  The concern his wife raised about dopamine agonist resulting sometimes in compulsive and impulsive behaviors was discussed at length.  We will attempt to change to gabapentin.  Per his request and wife's concerns about DA agonists: Better with ropinirole than gabapentin bc   Supportive therapy and problem solving around  distinguishing decision from OCD.    Started seeing [facility] counseling again.  Follow-up 4 weeks per pt request  Meredith Staggers MD, DFAPA.  Please see After Visit Summary for patient specific instructions.  Future Appointments  Date Time Provider Department Center  03/07/2021  1:30 PM Cottle, Steva Ready., MD [facility] None  03/21/2021  3:20 PM Helane Rima, DO [facility] None  03/22/2021  2:30 PM Cottle, Steva Ready., MD [facility] None  04/12/2021 11:20 AM Helane Rima, DO [facility] None  04/24/2021  1:00 PM Cottle, Steva Ready., MD [facility] None    No orders of the defined types were placed in this encounter.      -------------------------------

## 2021-03-08 ENCOUNTER — Ambulatory Visit (INDEPENDENT_AMBULATORY_CARE_PROVIDER_SITE_OTHER): Payer: Medicare Other | Admitting: Family Medicine

## 2021-03-09 DIAGNOSIS — R7303 Prediabetes: Secondary | ICD-10-CM | POA: Diagnosis not present

## 2021-03-09 DIAGNOSIS — Z79899 Other long term (current) drug therapy: Secondary | ICD-10-CM | POA: Diagnosis not present

## 2021-03-09 DIAGNOSIS — E7800 Pure hypercholesterolemia, unspecified: Secondary | ICD-10-CM | POA: Diagnosis not present

## 2021-03-15 DIAGNOSIS — Z79899 Other long term (current) drug therapy: Secondary | ICD-10-CM | POA: Diagnosis not present

## 2021-03-15 DIAGNOSIS — Z0001 Encounter for general adult medical examination with abnormal findings: Secondary | ICD-10-CM | POA: Diagnosis not present

## 2021-03-15 DIAGNOSIS — E7800 Pure hypercholesterolemia, unspecified: Secondary | ICD-10-CM | POA: Diagnosis not present

## 2021-03-15 DIAGNOSIS — F331 Major depressive disorder, recurrent, moderate: Secondary | ICD-10-CM | POA: Diagnosis not present

## 2021-03-21 ENCOUNTER — Ambulatory Visit (INDEPENDENT_AMBULATORY_CARE_PROVIDER_SITE_OTHER): Payer: Medicare Other | Admitting: Family Medicine

## 2021-03-21 DIAGNOSIS — G4733 Obstructive sleep apnea (adult) (pediatric): Secondary | ICD-10-CM | POA: Diagnosis not present

## 2021-03-22 ENCOUNTER — Encounter (INDEPENDENT_AMBULATORY_CARE_PROVIDER_SITE_OTHER): Payer: Self-pay

## 2021-03-22 ENCOUNTER — Ambulatory Visit: Payer: Medicare Other | Admitting: Psychiatry

## 2021-03-23 ENCOUNTER — Ambulatory Visit (INDEPENDENT_AMBULATORY_CARE_PROVIDER_SITE_OTHER): Payer: Medicare Other | Admitting: Family Medicine

## 2021-03-27 ENCOUNTER — Other Ambulatory Visit: Payer: Self-pay

## 2021-03-27 ENCOUNTER — Ambulatory Visit (INDEPENDENT_AMBULATORY_CARE_PROVIDER_SITE_OTHER): Payer: Medicare Other | Admitting: Family Medicine

## 2021-03-27 ENCOUNTER — Encounter (INDEPENDENT_AMBULATORY_CARE_PROVIDER_SITE_OTHER): Payer: Self-pay | Admitting: Family Medicine

## 2021-03-27 VITALS — BP 118/67 | HR 67 | Temp 97.5°F | Ht 71.0 in | Wt 245.0 lb

## 2021-03-27 DIAGNOSIS — R638 Other symptoms and signs concerning food and fluid intake: Secondary | ICD-10-CM | POA: Diagnosis not present

## 2021-03-27 DIAGNOSIS — E8881 Metabolic syndrome: Secondary | ICD-10-CM | POA: Diagnosis not present

## 2021-03-27 DIAGNOSIS — E88819 Insulin resistance, unspecified: Secondary | ICD-10-CM

## 2021-03-27 DIAGNOSIS — F331 Major depressive disorder, recurrent, moderate: Secondary | ICD-10-CM | POA: Diagnosis not present

## 2021-03-27 DIAGNOSIS — F900 Attention-deficit hyperactivity disorder, predominantly inattentive type: Secondary | ICD-10-CM

## 2021-03-27 DIAGNOSIS — E669 Obesity, unspecified: Secondary | ICD-10-CM

## 2021-03-27 DIAGNOSIS — Z6834 Body mass index (BMI) 34.0-34.9, adult: Secondary | ICD-10-CM

## 2021-03-27 DIAGNOSIS — Z6839 Body mass index (BMI) 39.0-39.9, adult: Secondary | ICD-10-CM

## 2021-04-01 ENCOUNTER — Other Ambulatory Visit: Payer: Self-pay | Admitting: Psychiatry

## 2021-04-03 NOTE — Progress Notes (Signed)
Chief Complaint:   OBESITY Eric [name] is here to discuss his progress with his obesity treatment plan along with follow-up of his obesity related diagnoses. See Medical Weight Management Flowsheet for complete bioelectrical impedance results.  Today's visit was #: 22 Starting weight: 285 lbs Starting date: 03/15/2020 Weight change since last visit: +2 lbs Total lbs lost to date: 40 lbs Total weight loss percentage to date: -14.04%  Nutrition Plan: Category 3 Plan for 50% of the time.  Activity: Pilates/walking/strength training for 45-60 minutes 3-4 times per week. Anti-obesity medications: Ozempic 2 mg subcutaneously weekly. Reported side effects: None.  Interim History: Eric [name]'s bioimpedance indicates an increase water and in muscle mass  He is taking Ozempic 2 mg weekly and Wellbutrin 150 mg x 3 per day.  He says he will be in [place] during the month of April and in [place] in July.  Assessment/Plan:   1. Craving for particular food, sugar Will add naltrexone to medication regimen if okay with Dr. Jennelle Human. This issue directly impacts care plan for optimization of BMI and metabolic health as it impacts the patient's ability to make lifestyle changes.  2. Insulin resistance, with polyphagia Goal is HgbA1c < 5.7, fasting insulin closer to 5.  Medication: Ozempic 2 mg subcutaneously weekly.    Plan:  Continue Ozempic 2 mg subcutaneously weekly. He will continue to focus on protein-rich, low simple carbohydrate foods. We reviewed the importance of hydration, regular exercise for stress reduction, and restorative sleep.   3. Attention deficit hyperactivity disorder (ADHD), predominantly inattentive type Controlled. Medication:  Adderall 20 mg daily and Ritalin 30 mg daily.  Counseling ADHD is the most missed diagnosis in relation to food and appetite problems. Often the strong urge to binge or to self-medicate with food subsides once the impulsivity and inattention of ADHD are treated.  A person can experience a new ability to tune in to the body's signals, control cravings and improve impulse control.  A deficiency in norepinephrine and dopamine can lead to the following behaviors related to eating: Poor awareness of internal cues of hunger and satiety, or fullness. Inability to follow a meal plan. Inability to judge portion size accurately. Inability to stop bingeing or purging. Distraction by continual thoughts of food, weight and body shape. Increased desire to overeat, especially high calorie, "reward" type foods. Poor self-esteem due to repeated failures of self-control.  Plan: The current medical regimen is effective;  continue present plan and medications.  4. Moderate episode of recurrent major depressive disorder (HCC) Eric [name] is taking Wellbutrin XL 450 mg daily and Lexapro 20 mg daily.  He is followed by Dr. Jennelle Human. Specifically regarding patient's less desirable eating habits and patterns, we employed the technique of small changes when he cannot fully commit to his prudent nutritional plan.  5. Obesity, current BMI of 34.3  Course: Eric [name] is currently in the action stage of change. As such, his goal is to continue with weight loss efforts.   Nutrition goals: He has agreed to the Category 3 Plan.   Exercise goals:  As is.  Behavioral modification strategies: increasing lean protein intake, decreasing simple carbohydrates, increasing vegetables, and increasing water intake.  Eric [name] has agreed to follow-up with our clinic in 4 weeks. He was informed of the importance of frequent follow-up visits to maximize his success with intensive lifestyle modifications for his multiple health conditions.   Objective:   Blood pressure 118/67, pulse 67, temperature (!) 97.5 F (36.4 C), temperature source Oral, height 5\' 11"  (  1.803 m), weight 245 lb (111.1 kg), SpO2 96 %. Body mass index is 34.17 kg/m.  General: Cooperative, alert, well developed, in no acute  distress. HEENT: Conjunctivae and lids unremarkable. Cardiovascular: Regular rhythm.  Lungs: Normal work of breathing. Neurologic: No focal deficits.   Lab Results  Component Value Date   TSH 1.560 12/22/2020   Attestation Statements:   Reviewed by clinician on day of visit: allergies, medications, problem list, medical history, surgical history, family history, social history, and previous encounter notes.  I, Insurance claims handler, CMA, am acting as transcriptionist for Helane Rima, DO  I have reviewed the above documentation for accuracy and completeness, and I agree with the above. -  Helane Rima, DO, MS, FAAFP, DABOM - Family and Bariatric Medicine.

## 2021-04-12 ENCOUNTER — Ambulatory Visit (INDEPENDENT_AMBULATORY_CARE_PROVIDER_SITE_OTHER): Payer: Medicare Other | Admitting: Family Medicine

## 2021-04-12 ENCOUNTER — Other Ambulatory Visit: Payer: Self-pay

## 2021-04-12 ENCOUNTER — Telehealth: Payer: Self-pay | Admitting: Psychiatry

## 2021-04-12 ENCOUNTER — Ambulatory Visit: Payer: Medicare Other | Admitting: Psychiatry

## 2021-04-12 ENCOUNTER — Encounter (INDEPENDENT_AMBULATORY_CARE_PROVIDER_SITE_OTHER): Payer: Self-pay | Admitting: Family Medicine

## 2021-04-12 VITALS — BP 143/73 | HR 69 | Temp 97.5°F | Ht 71.0 in | Wt 246.0 lb

## 2021-04-12 DIAGNOSIS — R638 Other symptoms and signs concerning food and fluid intake: Secondary | ICD-10-CM

## 2021-04-12 DIAGNOSIS — F341 Dysthymic disorder: Secondary | ICD-10-CM | POA: Diagnosis not present

## 2021-04-12 DIAGNOSIS — F900 Attention-deficit hyperactivity disorder, predominantly inattentive type: Secondary | ICD-10-CM | POA: Diagnosis not present

## 2021-04-12 DIAGNOSIS — F331 Major depressive disorder, recurrent, moderate: Secondary | ICD-10-CM

## 2021-04-12 DIAGNOSIS — E8881 Metabolic syndrome: Secondary | ICD-10-CM | POA: Diagnosis not present

## 2021-04-12 DIAGNOSIS — Z6834 Body mass index (BMI) 34.0-34.9, adult: Secondary | ICD-10-CM

## 2021-04-12 DIAGNOSIS — E669 Obesity, unspecified: Secondary | ICD-10-CM | POA: Diagnosis not present

## 2021-04-12 DIAGNOSIS — F422 Mixed obsessional thoughts and acts: Secondary | ICD-10-CM | POA: Diagnosis not present

## 2021-04-12 MED ORDER — METHYLPHENIDATE HCL 10 MG PO TABS: 30.0000 mg | ORAL_TABLET | Freq: Two times a day (BID) | ORAL | 0 refills | Status: DC

## 2021-04-12 MED ORDER — NALTREXONE HCL 50 MG PO TABS: 50.0000 mg | ORAL_TABLET | Freq: Every day | ORAL | 0 refills | Status: DC

## 2021-04-12 MED ORDER — SEMAGLUTIDE (2 MG/DOSE) 8 MG/3ML ~~LOC~~ SOPN: 2.0000 mg | PEN_INJECTOR | SUBCUTANEOUS | 1 refills | Status: AC

## 2021-04-12 NOTE — Telephone Encounter (Signed)
Pt says he needs to come in and get a written script for the methylphenidate.  He is going out of town and needs it for 30days. ? ?He had appt with Dr Jennelle Human today but Dr Jennelle Human was out of the office.  So he has no upcoming appts scheduled yet. ?

## 2021-04-12 NOTE — Telephone Encounter (Signed)
Get printed version to me to sign ?

## 2021-04-12 NOTE — Telephone Encounter (Signed)
Pended. Last filled 2/8.  ?

## 2021-04-13 ENCOUNTER — Ambulatory Visit (INDEPENDENT_AMBULATORY_CARE_PROVIDER_SITE_OTHER): Payer: Medicare Other | Admitting: Family Medicine

## 2021-04-24 ENCOUNTER — Telehealth: Payer: Medicare Other | Admitting: Psychiatry

## 2021-04-24 ENCOUNTER — Encounter: Payer: Medicare Other | Admitting: Psychiatry

## 2021-04-24 NOTE — Progress Notes (Signed)
Chief Complaint:   OBESITY Eric [name] is here to discuss his progress with his obesity treatment plan along with follow-up of his obesity related diagnoses. See Medical Weight Management Flowsheet for complete bioelectrical impedance results.  Today's visit was #: 23 Starting weight: 285 lbs Starting date: 03/15/2020 Weight change since last visit: +1 lb Total lbs lost to date: 36 lbs Total weight loss percentage to date: -13.68%  Nutrition Plan: Category 3 Plan for 50% of the time. Activity: Pilates/strength training/walking for 30-45 minutes 3-4 Anti-obesity medications: Ozempic 2 mg subcutaneously weekly. Reported side effects: None.  Interim History: Eric [name] says he is going on vacation soon.  Assessment/Plan:   1. Insulin resistance, with polyphagia Improving. Goal is HgbA1c < 5.7, fasting insulin closer to 5.  Medication: Ozempic 2 mg subcutaneously weekly.    Plan:  Continue Ozempic 2 mg subcutaneously weekly.  Will refill today. He will continue to focus on protein-rich, low simple carbohydrate foods. We reviewed the importance of hydration, regular exercise for stress reduction, and restorative sleep.   - Refill Semaglutide, 2 MG/DOSE, 8 MG/3ML SOPN; Inject 2 mg as directed once a week.  Dispense: 9 mL; Refill: 1  2. Craving for particular food, sugar Stable to worsening.  Plan:  Start naltrexone 50 mg daily.  - naltrexone (DEPADE) 50 MG tablet; Take 1 tablet (50 mg total) by mouth daily.  Dispense: 30 tablet; Refill: 0  3. Attention deficit hyperactivity disorder (ADHD), predominantly inattentive type Medication: per Psychiatry.  Counseling ADHD is the most missed diagnosis in relation to food and appetite problems. Often the strong urge to binge or to self-medicate with food subsides once the impulsivity and inattention of ADHD are treated. A person can experience a new ability to tune in to the body's signals, control cravings and improve impulse control.  A  deficiency in norepinephrine and dopamine can lead to the following behaviors related to eating: Poor awareness of internal cues of hunger and satiety, or fullness. Inability to follow a meal plan. Inability to judge portion size accurately. Inability to stop bingeing or purging. Distraction by continual thoughts of food, weight and body shape. Increased desire to overeat, especially high calorie, "reward" type foods. Poor self-esteem due to repeated failures of self-control.  Plan: The current medical regimen is effective; continue present plan and medications..  4. Obesity, current BMI of 34.3  Course: Eric [name] is currently in the action stage of change. As such, his goal is to continue with weight loss efforts.   Nutrition goals: He has agreed to the Category 3 Plan.   Exercise goals:  As is.  Behavioral modification strategies: increasing lean protein intake, decreasing simple carbohydrates, and increasing vegetables.  Eric [name] has agreed to follow-up with our clinic in 4 weeks. He was informed of the importance of frequent follow-up visits to maximize his success with intensive lifestyle modifications for his multiple health conditions.   Objective:   Blood pressure (!) 143/73, pulse 69, temperature (!) 97.5 F (36.4 C), temperature source Oral, height 5\' 11"  (1.803 m), weight 246 lb (111.6 kg), SpO2 97 %. Body mass index is 34.31 kg/m.  General: Cooperative, alert, well developed, in no acute distress. HEENT: Conjunctivae and lids unremarkable. Cardiovascular: Regular rhythm.  Lungs: Normal work of breathing. Neurologic: No focal deficits.   Lab Results  Component Value Date   TSH 1.560 12/22/2020   Attestation Statements:   Reviewed by clinician on day of visit: allergies, medications, problem list, medical history, surgical history, family history, social  history, and previous encounter notes.  I, Insurance claims handler, CMA, am acting as transcriptionist for Helane Rima,  DO  I have reviewed the above documentation for accuracy and completeness, and I agree with the above. -  Helane Rima, DO, MS, FAAFP, DABOM - Family and Bariatric Medicine.

## 2021-04-24 NOTE — Progress Notes (Signed)
This encounter was created in error - please disregard. ?Could not reach patient for televisit ?

## 2021-04-25 ENCOUNTER — Encounter: Payer: Self-pay | Admitting: Psychiatry

## 2021-04-25 ENCOUNTER — Telehealth: Payer: Medicare Other | Admitting: Psychiatry

## 2021-04-25 ENCOUNTER — Telehealth (INDEPENDENT_AMBULATORY_CARE_PROVIDER_SITE_OTHER): Payer: Medicare Other | Admitting: Psychiatry

## 2021-04-25 DIAGNOSIS — F5221 Male erectile disorder: Secondary | ICD-10-CM

## 2021-04-25 DIAGNOSIS — F331 Major depressive disorder, recurrent, moderate: Secondary | ICD-10-CM | POA: Diagnosis not present

## 2021-04-25 DIAGNOSIS — F900 Attention-deficit hyperactivity disorder, predominantly inattentive type: Secondary | ICD-10-CM | POA: Diagnosis not present

## 2021-04-25 DIAGNOSIS — G2581 Restless legs syndrome: Secondary | ICD-10-CM

## 2021-04-25 DIAGNOSIS — F422 Mixed obsessional thoughts and acts: Secondary | ICD-10-CM | POA: Diagnosis not present

## 2021-04-25 DIAGNOSIS — G4733 Obstructive sleep apnea (adult) (pediatric): Secondary | ICD-10-CM
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
## 2021-04-27 ENCOUNTER — Other Ambulatory Visit: Payer: Self-pay | Admitting: Psychiatry

## 2021-04-27 DIAGNOSIS — G2581 Restless legs syndrome: Secondary | ICD-10-CM

## 2021-04-29 ENCOUNTER — Other Ambulatory Visit: Payer: Self-pay | Admitting: Psychiatry

## 2021-05-12 ENCOUNTER — Other Ambulatory Visit: Payer: Self-pay | Admitting: Psychiatry

## 2021-05-12 DIAGNOSIS — G2581 Restless legs syndrome: Secondary | ICD-10-CM

## 2021-05-24 ENCOUNTER — Encounter: Payer: Self-pay | Admitting: Psychiatry

## 2021-05-24 ENCOUNTER — Ambulatory Visit (INDEPENDENT_AMBULATORY_CARE_PROVIDER_SITE_OTHER): Payer: Medicare Other | Admitting: Psychiatry

## 2021-05-24 DIAGNOSIS — F331 Major depressive disorder, recurrent, moderate: Secondary | ICD-10-CM | POA: Diagnosis not present

## 2021-05-24 DIAGNOSIS — G2581 Restless legs syndrome: Secondary | ICD-10-CM

## 2021-05-24 DIAGNOSIS — F900 Attention-deficit hyperactivity disorder, predominantly inattentive type: Secondary | ICD-10-CM

## 2021-05-24 DIAGNOSIS — R7989 Other specified abnormal findings of blood chemistry: Secondary | ICD-10-CM

## 2021-05-24 DIAGNOSIS — F5221 Male erectile disorder: Secondary | ICD-10-CM

## 2021-05-24 DIAGNOSIS — F422 Mixed obsessional thoughts and acts: Secondary | ICD-10-CM | POA: Diagnosis not present

## 2021-05-24 DIAGNOSIS — G4733 Obstructive sleep apnea (adult) (pediatric): Secondary | ICD-10-CM

## 2021-05-24 NOTE — Patient Instructions (Signed)
Option Vraylar ?
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
## 2021-06-14 DIAGNOSIS — R7303 Prediabetes: Secondary | ICD-10-CM | POA: Diagnosis not present

## 2021-06-14 DIAGNOSIS — R638 Other symptoms and signs concerning food and fluid intake: Secondary | ICD-10-CM | POA: Diagnosis not present

## 2021-06-14 DIAGNOSIS — F909 Attention-deficit hyperactivity disorder, unspecified type: Secondary | ICD-10-CM | POA: Diagnosis not present

## 2021-06-14 DIAGNOSIS — E669 Obesity, unspecified: Secondary | ICD-10-CM | POA: Diagnosis not present

## 2021-06-26 ENCOUNTER — Other Ambulatory Visit: Payer: Self-pay | Admitting: Psychiatry

## 2021-06-26 ENCOUNTER — Encounter: Payer: Self-pay | Admitting: Psychiatry

## 2021-06-26 ENCOUNTER — Ambulatory Visit: Payer: Medicare Other | Admitting: Psychiatry

## 2021-06-26 ENCOUNTER — Telehealth: Payer: Self-pay | Admitting: Psychiatry

## 2021-06-26 VITALS — BP 136/78 | HR 71

## 2021-06-26 DIAGNOSIS — G2581 Restless legs syndrome: Secondary | ICD-10-CM

## 2021-06-26 DIAGNOSIS — F900 Attention-deficit hyperactivity disorder, predominantly inattentive type: Secondary | ICD-10-CM | POA: Diagnosis not present

## 2021-06-26 DIAGNOSIS — F331 Major depressive disorder, recurrent, moderate: Secondary | ICD-10-CM | POA: Diagnosis not present

## 2021-06-26 DIAGNOSIS — F5221 Male erectile disorder: Secondary | ICD-10-CM

## 2021-06-26 DIAGNOSIS — G4733 Obstructive sleep apnea (adult) (pediatric): Secondary | ICD-10-CM

## 2021-06-26 DIAGNOSIS — F422 Mixed obsessional thoughts and acts: Secondary | ICD-10-CM | POA: Diagnosis not present

## 2021-06-26 MED ORDER — NALTREXONE HCL 50 MG PO TABS
25.0000 mg | ORAL_TABLET | Freq: Every day | ORAL | 1 refills | Status: DC
Start: 2021-06-26 — End: 2021-06-26

## 2021-06-26 MED ORDER — BUPROPION HCL ER (XL) 150 MG PO TB24: 450.0000 mg | ORAL_TABLET | Freq: Every day | ORAL | 0 refills | Status: DC

## 2021-06-26 MED ORDER — METHYLPHENIDATE HCL 10 MG PO TABS: 30.0000 mg | ORAL_TABLET | Freq: Two times a day (BID) | ORAL | 0 refills | Status: DC

## 2021-06-26 NOTE — Progress Notes (Signed)
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 tends to have less OCD bc triggered less.    Thinks anxiety and depression were  a little worse with 10 mg so increased Lexapro to 20 and this seems helpful partly since change in Sept 2022. Strongly discourage stopping SSRI bc high relapse risk.  Disc this can render himself more  resistant.  He wants to wean Wellbutrin over a couple of mos.  Ok down to 300 mg daily.  His OCD is persistent with checking lites, stove, etc. but it is not heavily time-consuming and is mildly to moderately distressing.  Overall his level of depression is mild to moderate with poor motivation to do things which bothers his wife.  Supportive therapy in solving this.Marland Kitchen  He is able to find things that he enjoys and he does have interests but they are reduced below normal for him.Marland Kitchen  He improved activity levels generally.  Discussed potential benefits, risks, and  side effects of stimulants with patient to include increased heart rate, palpitations, insomnia, increased anxiety, increased irritability, or decreased appetite.  Instructed patient to contact office if experiencing any significant tolerability issues. Disc risk of increasing the Ritalin further elevating BP and pulse if we increase the Ritalin.  Need to verify that it's not markedly elevated from taking the Ritalin. Doesn't think it's been consistently elevated. Disc crash risk.  He doesn't need feel it.    Disc difference between different types of cognitive difficulties such as Alzheimer's disease for which there is no evidence and ADD related inattentiveness which can contribute to some of the cognitive problems that his wife identifies.  He may also have longstanding underlying ADD.  He has used Ritalin but it is inherently short acting and he is only taking it once a day.  It would seem reasonable to use a longer acting  product to see if this solved some of the problems that his wife identifies.  He feels that Ritalin is helpful for energy, productivity, focus and attention. It might help his depression more if he could remember twice daily dosing of Ritalin.  We discussed techniques like combining it with his lunch meal or using an alarm in the phone.  Recommend he increase Ritalin to 30 mg every morning and 10 to 20 mg every afternoon to try to help with the motivation and as an off-label treatment for depression.  Disc SE.  Including worsening OCD  Normal B12 and folate and TSH recently.  Thinks memory problem relate to OCD bc often counting in the in the background at rest which tends to reduce his attention.  Ritalin is being successfully used off label to augment antidepressants for depression and have resulted in improved productivity and attention.  Previous screening of memory was not suggestive of any neuro degenerative process. Mini-Mental status exam 28/30 on 10/27/20.  No evidence of  dementia.  He has lost 47 # pounds on Ozempic. Disc use of this for weight loss.  This will help a number of things including joints.   Option sildenafil .  He wishes to defer.  No problem with the reduction in ropinirole. RLS managed.  Still no complaints. Disc also dx PLMS.  The concern his wife raised about dopamine agonist resulting sometimes in compulsive and impulsive behaviors was discussed at length.  We will attempt to change to gabapentin.  Per his request and wife's concerns about DA agonists: Better with ropinirole than gabapentin   Supportive therapy and problem solving around distinguishing decision from OCD.    Started seeing [facility] counseling again.  Follow-up 4 weeks per pt request  Meredith Staggers MD, DFAPA.  Please see After Visit Summary for patient specific instructions.  Future Appointments  Date Time Provider Department Center  07/26/2021  3:00 PM Cottle, Steva Ready., MD [facility] None  08/28/2021  3:00 PM Cottle, Steva Ready., MD [facility] None  09/28/2021  3:00 PM Cottle, Steva Ready., MD [facility] None  10/30/2021  3:00 PM Cottle, Steva Ready., MD [facility] None  11/30/2021  1:00 PM Cottle, Steva Ready., MD [facility] None  01/02/2022  1:00 PM Cottle, Steva Ready., MD [facility] None    No orders of the defined types were placed in this encounter.      -------------------------------

## 2021-06-27 DIAGNOSIS — R55 Syncope and collapse: Secondary | ICD-10-CM | POA: Diagnosis not present

## 2021-06-27 DIAGNOSIS — Q231 Congenital insufficiency of aortic valve: Secondary | ICD-10-CM | POA: Diagnosis not present

## 2021-06-27 NOTE — Telephone Encounter (Signed)
LVM to rtc 

## 2021-06-27 NOTE — Telephone Encounter (Signed)
Pharmacy lvm at 11:10 am that they received a script for Eric [name]. He just got it filled and requires a police report and verification that dr. Jennelle Human approves this refill. Please call them at [phone]

## 2021-06-28 DIAGNOSIS — I4510 Unspecified right bundle-branch block: Secondary | ICD-10-CM | POA: Diagnosis not present

## 2021-06-29 DIAGNOSIS — G4733 Obstructive sleep apnea (adult) (pediatric): Secondary | ICD-10-CM | POA: Diagnosis not present

## 2021-07-01 ENCOUNTER — Other Ambulatory Visit: Payer: Self-pay | Admitting: Psychiatry

## 2021-07-01 DIAGNOSIS — F331 Major depressive disorder, recurrent, moderate: Secondary | ICD-10-CM

## 2021-07-06 DIAGNOSIS — E669 Obesity, unspecified: Secondary | ICD-10-CM | POA: Diagnosis not present

## 2021-07-06 DIAGNOSIS — F39 Unspecified mood [affective] disorder: Secondary | ICD-10-CM | POA: Diagnosis not present

## 2021-07-06 DIAGNOSIS — E8881 Metabolic syndrome: Secondary | ICD-10-CM | POA: Diagnosis not present

## 2021-07-06 DIAGNOSIS — R638 Other symptoms and signs concerning food and fluid intake: Secondary | ICD-10-CM | POA: Diagnosis not present

## 2021-07-26 ENCOUNTER — Ambulatory Visit: Payer: Medicare Other | Admitting: Psychiatry

## 2021-07-26 DIAGNOSIS — G4733 Obstructive sleep apnea (adult) (pediatric): Secondary | ICD-10-CM | POA: Diagnosis not present

## 2021-08-24 DIAGNOSIS — R632 Polyphagia: Secondary | ICD-10-CM | POA: Diagnosis not present

## 2021-08-24 DIAGNOSIS — E65 Localized adiposity: Secondary | ICD-10-CM | POA: Diagnosis not present

## 2021-08-24 DIAGNOSIS — F909 Attention-deficit hyperactivity disorder, unspecified type: Secondary | ICD-10-CM | POA: Diagnosis not present

## 2021-08-24 DIAGNOSIS — R7303 Prediabetes: Secondary | ICD-10-CM | POA: Diagnosis not present

## 2021-08-28 ENCOUNTER — Encounter: Payer: Self-pay | Admitting: Psychiatry

## 2021-08-28 ENCOUNTER — Ambulatory Visit (INDEPENDENT_AMBULATORY_CARE_PROVIDER_SITE_OTHER): Payer: Medicare Other | Admitting: Psychiatry

## 2021-08-28 VITALS — BP 147/71 | HR 70

## 2021-08-28 DIAGNOSIS — G3184 Mild cognitive impairment, so stated: Secondary | ICD-10-CM

## 2021-08-28 DIAGNOSIS — F331 Major depressive disorder, recurrent, moderate: Secondary | ICD-10-CM

## 2021-08-28 DIAGNOSIS — F422 Mixed obsessional thoughts and acts: Secondary | ICD-10-CM | POA: Diagnosis not present

## 2021-08-28 DIAGNOSIS — R7989 Other specified abnormal findings of blood chemistry: Secondary | ICD-10-CM

## 2021-08-28 DIAGNOSIS — F900 Attention-deficit hyperactivity disorder, predominantly inattentive type: Secondary | ICD-10-CM

## 2021-08-28 DIAGNOSIS — G2581 Restless legs syndrome: Secondary | ICD-10-CM

## 2021-08-28 DIAGNOSIS — G4733 Obstructive sleep apnea (adult) (pediatric): Secondary | ICD-10-CM

## 2021-08-28 MED ORDER — METHYLPHENIDATE HCL ER (OSM) 36 MG PO TBCR: 72.0000 mg | EXTENDED_RELEASE_TABLET | Freq: Every day | ORAL | 0 refills | Status: DC

## 2021-08-28 NOTE — Progress Notes (Signed)
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 residual depression and anxiety and concerns about polypharmacy and he wants regular therapeutic advice on how to further reduce his OCD.  He believes the depression is a consequence of the residual OCD.  He has some compulsive checking and obsessions around the house maintenance.  He wishes to avoid sexual side effects and so we are keeping the SSRI at the lowest possible dose.  He is tried all of the reasonable SSRI options with the exception possibly of sertraline but it is likely to have more sexual dysfunction than what he is taking now.  When travels then tends to have less OCD bc triggered less.    Thinks anxiety and depression were  a little worse with 10 mg so increased Lexapro to 20 and this seems helpful partly since change in Sept 2022. Strongly discourage stopping SSRI bc high relapse risk.  Disc this can render himself more  resistant.  No problems off the Wellbutrin so far.    His OCD is persistent with checking  lites, stove, etc. but it is not heavily time-consuming and is mildly to moderately distressing.  Overall his level of depression is mild to moderate with poor motivation to do things which bothers his wife.  Supportive therapy in solving this.Marland Kitchen.  He is able to find things that he enjoys and he does have interests but they are reduced below normal for him.Marland Kitchen.  He improved activity levels generally.  Discussed potential benefits, risks, and side effects of stimulants with patient to include increased heart rate, palpitations, insomnia, increased anxiety, increased irritability, or decreased appetite.  Instructed patient to contact office if experiencing any significant tolerability issues. Disc risk of increasing the Ritalin further elevating BP and pulse if we increase the Ritalin.  Need to verify that it's not markedly elevated from taking the Ritalin. Doesn't think it's been consistently elevated. Disc crash risk.  He doesn't need feel  it.    Disc difference between different types of cognitive difficulties such as Alzheimer's disease for which there is no evidence and ADD related inattentiveness which can contribute to some of the cognitive problems that his wife identifies.  He may also have longstanding underlying ADD.  He has used Ritalin but it is inherently short acting and he is only taking it once a day.  It would seem reasonable to use a longer acting product to see if this solved some of the problems that his wife identifies.  He feels that Ritalin is helpful for energy, productivity, focus and attention. He wants to retry Concerta 72 mg took it in [place] and it seemed to help more Check BP and agreed.  Disc SE.  Including worsening OCD  Normal B12 and folate and TSH recently.  Thinks memory problem relate to OCD bc often counting in the in the background at rest which tends to reduce his attention.  Ritalin is being successfully used off label to augment antidepressants for depression and  have resulted in improved productivity and attention.  Previous screening of memory was not suggestive of any neuro degenerative process. Mini-Mental status exam 28/30 on 10/27/20.  No evidence of dementia.  He has lost 47 # pounds on Ozempic. Disc use of this for weight loss.  This will help a number of things including joints.   Option sildenafil .  He wishes to defer.  No problem with the reduction in ropinirole. RLS managed.  Still no complaints. Disc also dx PLMS.  The concern his wife raised about dopamine agonist resulting sometimes in compulsive and impulsive behaviors was discussed at length.  We will attempt to change to gabapentin.  Per his request and wife's concerns about DA agonists: Better with ropinirole than gabapentin   Supportive therapy and problem solving around distinguishing decision from OCD.    Started seeing [facility] counseling again.  Follow-up 4 weeks per pt request  Meredith Staggers MD, DFAPA.  Please see After Visit Summary for patient specific instructions.  Future Appointments  Date Time Provider Department Center  09/28/2021  3:00 PM Cottle, Steva Ready., MD [facility] None  10/30/2021  3:00 PM Cottle, Steva Ready., MD [facility] None  11/30/2021  1:00 PM Cottle, Steva Ready., MD [facility] None  01/02/2022  1:00 PM Cottle, Steva Ready., MD [facility] None    No orders of the defined types were placed in this encounter.      -------------------------------

## 2021-08-30 ENCOUNTER — Encounter (INDEPENDENT_AMBULATORY_CARE_PROVIDER_SITE_OTHER): Payer: Self-pay

## 2021-09-08 DIAGNOSIS — I4729 Other ventricular tachycardia: Secondary | ICD-10-CM | POA: Diagnosis not present

## 2021-09-08 DIAGNOSIS — Q231 Congenital insufficiency of aortic valve: Secondary | ICD-10-CM | POA: Diagnosis not present

## 2021-09-08 DIAGNOSIS — R0902 Hypoxemia: Secondary | ICD-10-CM | POA: Diagnosis not present

## 2021-09-26 DIAGNOSIS — R7303 Prediabetes: Secondary | ICD-10-CM | POA: Diagnosis not present

## 2021-09-26 DIAGNOSIS — E65 Localized adiposity: Secondary | ICD-10-CM | POA: Diagnosis not present

## 2021-09-26 DIAGNOSIS — R632 Polyphagia: Secondary | ICD-10-CM | POA: Diagnosis not present

## 2021-09-28 ENCOUNTER — Ambulatory Visit: Payer: Medicare Other | Admitting: Psychiatry

## 2021-09-28 ENCOUNTER — Encounter: Payer: Self-pay | Admitting: Psychiatry

## 2021-09-28 VITALS — BP 125/76 | HR 63

## 2021-09-28 DIAGNOSIS — F5221 Male erectile disorder: Secondary | ICD-10-CM

## 2021-09-28 DIAGNOSIS — F331 Major depressive disorder, recurrent, moderate: Secondary | ICD-10-CM | POA: Diagnosis not present

## 2021-09-28 DIAGNOSIS — G2581 Restless legs syndrome: Secondary | ICD-10-CM

## 2021-09-28 DIAGNOSIS — F900 Attention-deficit hyperactivity disorder, predominantly inattentive type: Secondary | ICD-10-CM | POA: Diagnosis not present

## 2021-09-28 DIAGNOSIS — F422 Mixed obsessional thoughts and acts: Secondary | ICD-10-CM

## 2021-09-28 DIAGNOSIS — G3184 Mild cognitive impairment, so stated: Secondary | ICD-10-CM

## 2021-09-28 DIAGNOSIS — G4733 Obstructive sleep apnea (adult) (pediatric): Secondary | ICD-10-CM

## 2021-09-28 DIAGNOSIS — R7989 Other specified abnormal findings of blood chemistry: Secondary | ICD-10-CM

## 2021-09-28 MED ORDER — METHYLPHENIDATE HCL ER (OSM) 54 MG PO TBCR: 54.0000 mg | EXTENDED_RELEASE_TABLET | Freq: Every day | ORAL | 0 refills | Status: DC

## 2021-09-28 NOTE — Progress Notes (Signed)
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 for follow-up, depression and adhd.  Diagnoses and all orders for this visit:  Mixed obsessional thoughts and acts  Major depressive disorder, recurrent episode, moderate (HCC)  Attention deficit hyperactivity disorder (ADHD), predominantly inattentive type -     methylphenidate 54 MG PO CR tablet; Take 1 tablet (54 mg total) by mouth daily.  Restless legs syndrome  Obstructive sleep apnea  Low vitamin D level  Mild cognitive impairment  Erectile disorder, acquired, generalized, moderate  Restless legs syndrome (RLS)     Greater than 50% of 30 min face to face time with patient was spent on counseling and coordination of care. We discussed Mr. Paulita Cradleendergraft has a long history of depression and OCD which are partially controlled.  He requires frequent follow-up and his request because of significant residual depression and anxiety and concerns about polypharmacy and he wants regular  therapeutic advice on how to further reduce his OCD.  He believes the depression is a consequence of the residual OCD.  He has some compulsive checking and obsessions around the house maintenance.  He wishes to avoid sexual side effects and so we are keeping the SSRI at the lowest possible dose.  He is tried all of the reasonable SSRI options with the exception possibly of sertraline but it is likely to have more sexual dysfunction than what he is taking now.  When travels then tends to have less OCD bc triggered less.    Thinks anxiety and depression were  a little worse with 10 mg so increased Lexapro to 20 and this seems helpful partly since change in Sept 2022. Strongly discourage stopping SSRI bc high relapse risk.  Disc this can render himself more  resistant.  No problems off the Wellbutrin  His OCD is persistent  with checking lites, stove, etc. but it is not heavily time-consuming and is mildly to moderately distressing. Some obsessing over BP but it is normal.  Disc   Overall his level of depression is mild to moderate with poor motivation to do things which bothers his wife.  Supportive therapy in solving this.Marland Kitchen  He is able to find things that he enjoys and he does have interests but they are reduced below normal for him.Marland Kitchen  He improved activity levels generally.  Discussed potential benefits, risks, and side effects of stimulants with patient to include increased heart rate, palpitations, insomnia, increased anxiety, increased irritability, or decreased appetite.  Instructed patient to contact office if experiencing any significant tolerability issues. Disc risk of increasing the Ritalin further elevating BP and pulse if we increase the Ritalin.  Need to verify that it's not markedly elevated from taking the Ritalin. Doesn't think it's been consistently elevated. Disc crash risk.  He doesn't need feel it.    Disc difference between different types of cognitive difficulties such as  Alzheimer's disease for which there is no evidence and ADD related inattentiveness which can contribute to some of the cognitive problems that his wife identifies.  He may also have longstanding underlying ADD.  He has used Ritalin but it is inherently short acting and he is only taking it once a day.  It would seem reasonable to use a longer acting product to see if this solved some of the problems that his wife identifies.  He feels that Ritalin is helpful for energy, productivity, focus and attention. He wants to use Concerta 54 mg bc benefit but more SE at higher dose. Check BP and agreed disc in detail.  Disc SE.  Including worsening OCD  Normal B12 and folate and TSH recently.  Thinks memory problem relate to OCD bc often counting in the in the background at rest which tends to reduce his attention.  Ritalin is being successfully used off label to augment antidepressants for depression and have resulted in improved productivity and attention.  Previous screening of memory was not suggestive of any neuro degenerative process. Mini-Mental status exam 28/30 on 10/27/20.  No evidence of dementia.  He has lost 47 # pounds on Ozempic. Disc use of this for weight loss.  This will help a number of things including joints.   Option sildenafil .  He wishes to defer.  No problem with the reduction in ropinirole. RLS managed.  Still no complaints. Disc also dx PLMS.  The concern his wife raised about dopamine agonist resulting sometimes in compulsive and impulsive behaviors was discussed at length.  We will attempt to change to gabapentin.  Per his request and wife's concerns about DA agonists: Better with ropinirole than gabapentin   Supportive therapy and problem solving around distinguishing decision from OCD.    Disc naltrexone and wt loss given his lack of sufficient repsonse with Ozempic 2mg  weekly.  Started seeing counseling again.  Follow-up 4 weeks per pt request  [employer]  MD, DFAPA.  Please see After Visit Summary for patient specific instructions.  Future Appointments  Date Time Provider Department Center  09/29/2021  9:30 AM 11/29/2021, MD [facility] None  11/30/2021  1:00 PM Cottle, 13/09/2021., MD [facility] None  01/02/2022  1:00 PM Cottle, 14/12/2021., MD [facility] None    No orders of the defined types were placed in this encounter.      -------------------------------

## 2021-09-29 ENCOUNTER — Ambulatory Visit (INDEPENDENT_AMBULATORY_CARE_PROVIDER_SITE_OTHER): Payer: Medicare Other

## 2021-09-29 ENCOUNTER — Ambulatory Visit: Payer: Self-pay

## 2021-09-29 ENCOUNTER — Ambulatory Visit: Payer: Medicare Other | Admitting: Family Medicine

## 2021-09-29 VITALS — BP 122/74 | HR 65 | Ht 71.0 in | Wt 256.8 lb

## 2021-09-29 DIAGNOSIS — M25551 Pain in right hip: Secondary | ICD-10-CM

## 2021-09-29 DIAGNOSIS — M7061 Trochanteric bursitis, right hip: Secondary | ICD-10-CM | POA: Diagnosis not present

## 2021-09-29 DIAGNOSIS — M7062 Trochanteric bursitis, left hip: Secondary | ICD-10-CM | POA: Diagnosis not present

## 2021-09-29 DIAGNOSIS — M25552 Pain in left hip: Secondary | ICD-10-CM | POA: Diagnosis not present

## 2021-09-29 NOTE — Patient Instructions (Addendum)
Thank you for coming in today.   Please get an Xray today before you leave   I've referred you to Physical Therapy.  Let us know if you don't hear from them in one week.   If not improving let me know.   We can do more.

## 2021-09-29 NOTE — Progress Notes (Signed)
I, Eric [name], LAT, ATC acting as a scribe for Eric Graham, MD.  Subjective:    CC: Bilat hip pain  HPI: Pt is a 71 y/o male c/o cont'd bilat hip pain. Pt was last seen by Dr. Denyse Amass for this complaint on 07/22/19. Today, pt reports bilat hip pain have worsened over the last 6 month. Bilat hip pain was exacerbated when he was traveling [place]. Pt locates pain to deep within both hip pain.  Pain is located primarily in the lateral hips but he does have some anterior hip pain as well.  Pain is primarily worse with walking.  He does not have trouble getting his shoes or socks on.  Radiates: no Mechanical symptoms: no LE Numbness/tingling: no LE Weakness: no Aggravates: increased activity/walking Treatments tried: prior PT, naproxen  Dx imaging: 07/22/19 R & L hip XR  Pertinent review of Systems: No fevers or chills  Relevant historical information: Depression.   Objective:    Vitals:   09/29/21 0932  BP: 122/74  Pulse: 65  SpO2: 95%   General: Well Developed, well nourished, and in no acute distress.   MSK: Right hip: Normal-appearing Tender palpation greater trochanter. Hip range of motion reduced to internal rotation but normal flexion and external rotation.  Not much pain present with internal rotation. Strength: 4/5 abduction.  5/5 external rotation.  Left hip: Normal appearing. Tender palpation greater trochanter. Hip range of motion reduced to internal rotation but otherwise normal to flexion and external rotation and no pain present with rotation. Strength: 4/5 abduction.  5/5 external rotation.   Lab and Radiology Results  X-ray images bilateral hips obtained today personally and independently interpreted  Right hip: Mild DJD minimal rounded osteophytes or avulsions of the inferior pubic ramus  Left hip: Mild DJD.  Rounded avulsions or osteophytes at the inferior pubic ramus  No acute fractures are present bilateral hips or pelvis  Await formal  radiology review  Hip greater trochanteric injection: Right Consent obtained and timeout performed. Area of maximum tenderness palpated and identified. Skin cleaned with alcohol, cold spray applied. A spinal needle was used to access the greater trochanteric bursa. 40mg  of Kenalog and 2 mL of Marcaine were used to inject the trochanteric bursa. Patient tolerated the procedure well.  Hip greater trochanteric injection: Left Consent obtained and timeout performed. Area of maximum tenderness palpated and identified. Skin cleaned with alcohol, cold spray applied. A spinal needle was used to access the greater trochanteric bursa. 40mg  of Kenalog and 2 mL of Marcaine were used to inject the trochanteric bursa. Patient tolerated the procedure well.   Impression and Recommendations:    Assessment and Plan: 71 y.o. male with bilateral hip pain predominantly felt in the lateral hips.  Pain is predominantly due to greater trochanteric bursitis.  He does have hip arthritis and that could be a source of pain as well but this is thought to be less dominant.  Plan for greater trochanter bursa injection.  Additionally refer to physical therapy.  Hip adduction strengthening should help.  He has established relationship with a physical therapist in [place] which we will refer to.  Recheck in about 6 weeks.  If not improved consider either trial of diagnostic and therapeutic intra-articular hip injection or potentially even MRI.  PDMP not reviewed this encounter. Orders Placed This Encounter  Procedures   DG HIPS BILAT WITH PELVIS MIN 5 VIEWS    Standing Status:   Future    Number of Occurrences:   1  Standing Expiration Date:   09/30/2022    Order Specific Question:   Reason for Exam (SYMPTOM  OR DIAGNOSIS REQUIRED)    Answer:   eval hip pain    Order Specific Question:   Preferred imaging location?    Answer:   [facility]   Ambulatory referral to Physical Therapy    Referral  Priority:   Routine    Referral Type:   Physical Medicine    Referral Reason:   Specialty Services Required    Requested Specialty:   Physical Therapy    Number of Visits Requested:   1   No orders of the defined types were placed in this encounter.   Discussed warning signs or symptoms. Please see discharge instructions. Patient expresses understanding.   The above documentation has been reviewed and is accurate and complete Eric [name], M.D.

## 2021-10-02 DIAGNOSIS — R55 Syncope and collapse: Secondary | ICD-10-CM | POA: Diagnosis not present

## 2021-10-02 NOTE — Progress Notes (Signed)
Mild hip arthritis present on x-ray both sides.

## 2021-10-06 NOTE — Telephone Encounter (Signed)
CD/Report ready and placed in cabinet up front - JAI

## 2021-10-11 DIAGNOSIS — R638 Other symptoms and signs concerning food and fluid intake: Secondary | ICD-10-CM | POA: Diagnosis not present

## 2021-10-11 DIAGNOSIS — G4733 Obstructive sleep apnea (adult) (pediatric): Secondary | ICD-10-CM | POA: Diagnosis not present

## 2021-10-11 DIAGNOSIS — E65 Localized adiposity: Secondary | ICD-10-CM | POA: Diagnosis not present

## 2021-10-11 DIAGNOSIS — Z72820 Sleep deprivation: Secondary | ICD-10-CM | POA: Diagnosis not present

## 2021-10-30 ENCOUNTER — Other Ambulatory Visit: Payer: Self-pay

## 2021-10-30 ENCOUNTER — Telehealth: Payer: Self-pay | Admitting: Psychiatry

## 2021-10-30 ENCOUNTER — Ambulatory Visit: Payer: Medicare Other | Admitting: Psychiatry

## 2021-10-30 DIAGNOSIS — F900 Attention-deficit hyperactivity disorder, predominantly inattentive type: Secondary | ICD-10-CM

## 2021-10-30 MED ORDER — METHYLPHENIDATE HCL ER (OSM) 54 MG PO TBCR: 54.0000 mg | EXTENDED_RELEASE_TABLET | Freq: Every day | ORAL | 0 refills | Status: DC

## 2021-10-30 NOTE — Telephone Encounter (Signed)
Pt requesting Rx for Methylphenidate 54 mg. [facility]. Apt 11/9

## 2021-10-30 NOTE — Telephone Encounter (Signed)
Pended.

## 2021-11-06 DIAGNOSIS — I6523 Occlusion and stenosis of bilateral carotid arteries: Secondary | ICD-10-CM | POA: Diagnosis not present

## 2021-11-06 DIAGNOSIS — I779 Disorder of arteries and arterioles, unspecified: Secondary | ICD-10-CM | POA: Diagnosis not present

## 2021-11-06 DIAGNOSIS — E785 Hyperlipidemia, unspecified: Secondary | ICD-10-CM | POA: Diagnosis not present

## 2021-11-06 DIAGNOSIS — I2510 Atherosclerotic heart disease of native coronary artery without angina pectoris: Secondary | ICD-10-CM | POA: Diagnosis not present

## 2021-11-15 ENCOUNTER — Other Ambulatory Visit: Payer: Self-pay | Admitting: Psychiatry

## 2021-11-15 DIAGNOSIS — Z72820 Sleep deprivation: Secondary | ICD-10-CM | POA: Diagnosis not present

## 2021-11-15 DIAGNOSIS — G2581 Restless legs syndrome: Secondary | ICD-10-CM

## 2021-11-15 DIAGNOSIS — E8881 Metabolic syndrome: Secondary | ICD-10-CM | POA: Diagnosis not present

## 2021-11-15 DIAGNOSIS — R638 Other symptoms and signs concerning food and fluid intake: Secondary | ICD-10-CM | POA: Diagnosis not present

## 2021-11-15 DIAGNOSIS — E669 Obesity, unspecified: Secondary | ICD-10-CM | POA: Diagnosis not present

## 2021-11-30 ENCOUNTER — Ambulatory Visit: Payer: Medicare Other | Admitting: Psychiatry

## 2021-12-12 DIAGNOSIS — Q231 Congenital insufficiency of aortic valve: Secondary | ICD-10-CM | POA: Diagnosis not present

## 2021-12-27 DIAGNOSIS — E8881 Metabolic syndrome: Secondary | ICD-10-CM | POA: Diagnosis not present

## 2021-12-27 DIAGNOSIS — Z6836 Body mass index (BMI) 36.0-36.9, adult: Secondary | ICD-10-CM | POA: Diagnosis not present

## 2021-12-27 DIAGNOSIS — G4733 Obstructive sleep apnea (adult) (pediatric): Secondary | ICD-10-CM | POA: Diagnosis not present

## 2021-12-27 DIAGNOSIS — F5081 Binge eating disorder: Secondary | ICD-10-CM | POA: Diagnosis not present

## 2022-01-02 ENCOUNTER — Encounter: Payer: Self-pay | Admitting: Psychiatry

## 2022-01-02 ENCOUNTER — Ambulatory Visit: Payer: Medicare Other | Admitting: Psychiatry

## 2022-01-02 VITALS — BP 126/79 | HR 74

## 2022-01-02 DIAGNOSIS — F5221 Male erectile disorder: Secondary | ICD-10-CM

## 2022-01-02 DIAGNOSIS — G2581 Restless legs syndrome: Secondary | ICD-10-CM

## 2022-01-02 DIAGNOSIS — F331 Major depressive disorder, recurrent, moderate: Secondary | ICD-10-CM | POA: Diagnosis not present

## 2022-01-02 DIAGNOSIS — G3184 Mild cognitive impairment, so stated: Secondary | ICD-10-CM

## 2022-01-02 DIAGNOSIS — F900 Attention-deficit hyperactivity disorder, predominantly inattentive type: Secondary | ICD-10-CM

## 2022-01-02 DIAGNOSIS — R7989 Other specified abnormal findings of blood chemistry: Secondary | ICD-10-CM

## 2022-01-02 DIAGNOSIS — G4733 Obstructive sleep apnea (adult) (pediatric): Secondary | ICD-10-CM

## 2022-01-02 DIAGNOSIS — F422 Mixed obsessional thoughts and acts: Secondary | ICD-10-CM | POA: Diagnosis not present

## 2022-01-02 MED ORDER — METHYLPHENIDATE HCL ER (OSM) 36 MG PO TBCR: 36.0000 mg | EXTENDED_RELEASE_TABLET | Freq: Every day | ORAL | 0 refills | Status: DC

## 2022-01-02 NOTE — Progress Notes (Signed)
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 "BASOSABS"  No results found for: "POCLITH", "LITHIUM"   No results found for: "PHENYTOIN", "PHENOBARB", "VALPROATE", "CBMZ"  Vitamin D level acceptable at 54.5.    Echocardiogram is stable re: AVR over the last 8 years and not likely the cause of lethargy.  .res Assessment: Plan:    Eric [name] was seen today for follow-up, depression and adhd.  Diagnoses and all orders for this visit:  Mixed obsessional thoughts and acts  Major depressive disorder, recurrent episode, moderate (HCC)  Attention deficit hyperactivity disorder (ADHD), predominantly inattentive type -     methylphenidate 36 MG PO CR tablet; Take 1 tablet (36 mg total) by mouth daily.  Restless legs syndrome  Obstructive sleep apnea  Low vitamin D level  Mild cognitive impairment  Erectile disorder, acquired, generalized, moderate     Greater than 50% of 30 min face to face time with  patient was spent on counseling and coordination of care. We discussed Eric [name] has a long history of depression and OCD which are partially controlled.  He requires frequent follow-up and his request because of significant residual depression and anxiety and concerns about polypharmacy and he wants regular therapeutic advice on how to further reduce his OCD.  He believes the depression is a consequence of the residual OCD.  He has some compulsive checking and obsessions around the house maintenance.  He wishes to avoid sexual side effects and so we are keeping the SSRI at the lowest possible dose.  He is tried all of the reasonable SSRI options with the exception possibly of sertraline but it is likely to have more sexual dysfunction than what he is taking now.  When travels then tends to  have less OCD bc triggered less.    Thinks anxiety and depression were  a little worse with 10 mg so increased Lexapro to 20 and this seems helpful partly since change in Sept 2022. Strongly discourage stopping SSRI bc high relapse risk.  Disc this can render himself more  resistant.  He's agreeable with recent flare up.  His OCD is persistent with checking lites, stove, etc. but it is not heavily time-consuming and is mildly to moderately distressing.  Overall his level of depression is mild to moderate with poor motivation to do things which bothers his wife.  Supportive therapy in solving this.Marland Kitchen  He is able to find things that he enjoys and he does have interests but they are reduced below normal for him.Marland Kitchen  He improved activity levels generally.  Discussed potential benefits, risks, and side effects of stimulants with patient to include increased heart rate, palpitations, insomnia, increased anxiety, increased irritability, or decreased appetite.  Instructed patient to contact office if experiencing any significant tolerability issues. Disc risk of increasing the Ritalin further elevating BP and pulse if we  increase the Ritalin.  Need to verify that it's not markedly elevated from taking the Ritalin. Doesn't think it's been consistently elevated. Disc crash risk.  He doesn't need feel it.    Disc difference between different types of cognitive difficulties such as Alzheimer's disease for which there is no evidence and ADD related inattentiveness which can contribute to some of the cognitive problems that his wife identifies.  He may also have longstanding underlying ADD.  He has used Ritalin but it is inherently short acting and he is only taking it once a day.  It would seem reasonable to use a longer acting product to see if this solved some of the problems that his wife identifies.  He feels that Ritalin is helpful for energy, productivity, focus and attention. He wants to use Concerta 54 mg bc benefit but more SE at higher dose. Check BP and agreed disc in detail.  Disc SE.  Including worsening OCD  Normal B12 and folate and TSH recently.  Thinks memory problem relate to OCD bc often counting in the in the background at rest which tends to reduce his attention.  Ritalin is being successfully used off label to augment antidepressants for depression and have resulted in improved productivity and attention.  Previous screening of memory was not suggestive of any neuro degenerative process. Mini-Mental status exam 28/30 on 10/27/20.  No evidence of dementia.  He has lost 47 # pounds on Ozempic. Disc use of this for weight loss.  This will help a number of things including joints.   Option sildenafil .  He wishes to defer.  No problem with the reduction in ropinirole. RLS managed.  Still no complaints. Disc also dx PLMS.  The concern his wife raised about dopamine agonist resulting sometimes in compulsive and impulsive behaviors was discussed at length.  We will attempt to change to gabapentin.  Per his request and wife's concerns about DA agonists: Better with ropinirole than gabapentin    Supportive therapy and problem solving around distinguishing decision from OCD.    Disc naltrexone and wt loss given his lack of sufficient repsonse with Ozempic  weekly.  Started seeing [facility] counseling again.  Follow-up 4 weeks per pt request  Eric Staggers MD, DFAPA.  Please see After Visit Summary for patient specific instructions.  Future Appointments  Date Time Provider Department Center  03/12/2022  2:00 PM Cottle,  Steva Ready., MD [facility] None  04/10/2022  1:00 PM Cottle, Steva Ready., MD [facility] None  05/10/2022  1:00 PM Cottle, Steva Ready., MD [facility] None  06/11/2022  1:00 PM Cottle, Steva Ready., MD [facility] None  07/12/2022  1:00 PM Cottle, Steva Ready., MD [facility] None     No orders of the defined types were placed in this encounter.      -------------------------------

## 2022-01-18 ENCOUNTER — Telehealth: Payer: Self-pay | Admitting: Psychiatry

## 2022-01-18 ENCOUNTER — Telehealth: Payer: Self-pay

## 2022-01-18 NOTE — Telephone Encounter (Addendum)
Prior Authorization initiated Methylphenidate 36 mg CR  #30 [employer] Part D  Approved Effective 01/19/22-01/20/23  Patient notified.

## 2022-01-18 NOTE — Telephone Encounter (Signed)
PT called in to ask whether methylphenidate has an updated PA. The RX from 12/12 is not filled and pending a PA per pharmacy.

## 2022-01-19 ENCOUNTER — Telehealth: Payer: Self-pay | Admitting: Psychiatry

## 2022-01-19 NOTE — Telephone Encounter (Signed)
Eric [name] with [facility] [employer] called for approval on Eric [name], DOB [birth year]. Approval given for non-formulary exception on medication request for Methylphenidate HCI ER 36 mg or TBCR. Member has been noticed about approval with start date 01/19/22 until end date 01/20/23. Any questions call her at [phone] and option 5.

## 2022-01-19 NOTE — Telephone Encounter (Signed)
I initiated the PA. Asked Traci to review and make any changes needed.

## 2022-01-25 DIAGNOSIS — F429 Obsessive-compulsive disorder, unspecified: Secondary | ICD-10-CM | POA: Diagnosis not present

## 2022-01-25 DIAGNOSIS — R7303 Prediabetes: Secondary | ICD-10-CM | POA: Diagnosis not present

## 2022-01-25 DIAGNOSIS — F331 Major depressive disorder, recurrent, moderate: Secondary | ICD-10-CM | POA: Diagnosis not present

## 2022-02-01 DIAGNOSIS — G2581 Restless legs syndrome: Secondary | ICD-10-CM | POA: Diagnosis not present

## 2022-02-01 DIAGNOSIS — G4733 Obstructive sleep apnea (adult) (pediatric): Secondary | ICD-10-CM | POA: Diagnosis not present

## 2022-02-10 ENCOUNTER — Other Ambulatory Visit: Payer: Self-pay | Admitting: Psychiatry

## 2022-02-12 DIAGNOSIS — E65 Localized adiposity: Secondary | ICD-10-CM | POA: Diagnosis not present

## 2022-02-12 DIAGNOSIS — F331 Major depressive disorder, recurrent, moderate: Secondary | ICD-10-CM | POA: Diagnosis not present

## 2022-02-12 DIAGNOSIS — R7301 Impaired fasting glucose: Secondary | ICD-10-CM | POA: Diagnosis not present

## 2022-02-12 DIAGNOSIS — K5903 Drug induced constipation: Secondary | ICD-10-CM | POA: Diagnosis not present

## 2022-03-12 ENCOUNTER — Ambulatory Visit (INDEPENDENT_AMBULATORY_CARE_PROVIDER_SITE_OTHER): Payer: Medicare Other | Admitting: Psychiatry

## 2022-03-12 ENCOUNTER — Encounter: Payer: Self-pay | Admitting: Psychiatry

## 2022-03-12 DIAGNOSIS — G4733 Obstructive sleep apnea (adult) (pediatric): Secondary | ICD-10-CM

## 2022-03-12 DIAGNOSIS — G4710 Hypersomnia, unspecified: Secondary | ICD-10-CM

## 2022-03-12 DIAGNOSIS — F900 Attention-deficit hyperactivity disorder, predominantly inattentive type: Secondary | ICD-10-CM | POA: Diagnosis not present

## 2022-03-12 DIAGNOSIS — F422 Mixed obsessional thoughts and acts: Secondary | ICD-10-CM

## 2022-03-12 DIAGNOSIS — G3184 Mild cognitive impairment, so stated: Secondary | ICD-10-CM

## 2022-03-12 DIAGNOSIS — G2581 Restless legs syndrome: Secondary | ICD-10-CM

## 2022-03-12 DIAGNOSIS — G4730 Sleep apnea, unspecified: Secondary | ICD-10-CM

## 2022-03-12 DIAGNOSIS — R7989 Other specified abnormal findings of blood chemistry: Secondary | ICD-10-CM

## 2022-03-12 DIAGNOSIS — F331 Major depressive disorder, recurrent, moderate: Secondary | ICD-10-CM | POA: Diagnosis not present

## 2022-03-12 NOTE — Progress Notes (Signed)
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 hallucinations.        Mood and Affect: Mood is anxious and depressed. Affect is not labile, blunt, angry or inappropriate.        Speech: Speech normal.        Behavior: Behavior normal. Behavior is cooperative.        Thought Content: Thought content normal. Thought content is not paranoid or delusional. Thought content does not include homicidal or suicidal ideation. Thought content does not include suicidal plan.        Cognition and Memory: Cognition and memory normal.        Judgment: Judgment normal.     Comments: Insight intact Residual depression and fatigued   November 06, 2018: [place] Cog test in office within normal limits MMSE 28/30. Animal fluency 17 . (borderline) Taken as a whole, no indication to pursue neuropsychological testing.  Mini-Mental status exam 28/30 on 10/27/20.  No evidence of dementia.  Lab Review:  No results found for: "NA", "K", "CL", "CO2", "GLUCOSE", "BUN", "CREATININE",  "CALCIUM", "PROT", "ALBUMIN", "AST", "ALT", "ALKPHOS", "BILITOT", "GFRNONAA", "GFRAA"  No results found for: "WBC", "RBC", "HGB", "HCT", "PLT", "MCV", "MCH", "MCHC", "RDW", "LYMPHSABS", "MONOABS", "EOSABS", "BASOSABS"  No results found for: "POCLITH", "LITHIUM"   No results found for: "PHENYTOIN", "PHENOBARB", "VALPROATE", "CBMZ"  Vitamin D level acceptable at 54.5.    Echocardiogram is stable re: AVR over the last 8 years and not likely the cause of lethargy.  .res Assessment: Plan:    Juriah was seen today for follow-up, depression, anxiety, add and fatigue.  Diagnoses and all orders for this visit:  Major depressive disorder, recurrent episode, moderate ([place])  Obstructive sleep apnea  Mixed obsessional thoughts and  acts  Attention deficit hyperactivity disorder (ADHD), predominantly inattentive type  Restless legs syndrome  Low vitamin D level  Mild cognitive impairment  Restless legs syndrome (RLS)  Hypersomnia with sleep apnea     Greater than 50% of 30 min face to face time with patient was spent on counseling and coordination of care. We discussed Mr. Suffridge has a long history of depression and OCD which are partially controlled.  He requires frequent follow-up and his request because of significant residual depression and anxiety and concerns about polypharmacy and he wants regular therapeutic advice on how to further reduce his OCD.  He believes the depression is a consequence of the residual OCD.  He has some compulsive checking and obsessions around the house maintenance.  He wishes to avoid sexual side effects and so we are keeping the SSRI at the lowest possible dose.  He is tried all of the reasonable SSRI options with the exception possibly of sertraline but it is likely to have more sexual dysfunction than what he is taking now.  When travels then tends to have less OCD bc triggered less.    Reduce Lexapro to 20 mg daily.  He tried 40 mg daily without  extra benefit.  Disc extra cardiac risk.  Trial Auvelity once clear what the result of sleep study is.  Sleep disorder could be cause of the hypersomnia or it could be residual depression.  His OCD is persistent with checking lites, stove, etc. but it is not heavily time-consuming and is mildly to moderately distressing.  Overall his level of depression is mild to moderate with poor motivation to do things which bothers his wife.  Supportive therapy in solving this.Marland Kitchen  He is able to find things that he enjoys and he does have interests but they are reduced below normal for him.Marland Kitchen  He improved activity levels generally.  Discussed potential benefits, risks, and side effects of stimulants with patient to include increased heart rate, palpitations, insomnia, increased anxiety, increased irritability, or decreased appetite.  Instructed patient to contact office if experiencing any significant tolerability issues. Disc risk of increasing the Ritalin further elevating BP and pulse if we increase the Ritalin.  Need to verify that it's not markedly elevated from taking the Ritalin. Doesn't think it's been consistently elevated. Disc crash risk.  He doesn't need feel it.    Disc difference between different types of cognitive difficulties such as Alzheimer's disease for which there is no evidence and ADD related inattentiveness which can contribute to some of the cognitive problems that his wife identifies.  He may also have longstanding underlying ADD.  He has used Ritalin but it is inherently short acting and he is only taking it once a day.  It would seem reasonable to use a longer acting product to see if this solved some of the problems that his wife identifies.  He feels that Ritalin is helpful for energy, productivity, focus and attention. He wants to use Concerta 54 mg bc benefit but more SE at higher dose. Check BP and agreed disc in detail.  Disc SE.   Normal B12 and folate and TSH  recently.  Thinks memory problem relate to OCD bc often counting in the in the background at rest which tends to reduce his attention.  Ritalin is being successfully used off label to augment antidepressants for depression and have resulted in improved productivity and attention.  Previous screening of memory was not suggestive of any neuro degenerative process. Mini-Mental status exam  28/30 on 10/27/20.  No evidence of dementia.  He has lost 47 # pounds on Ozempic. Disc use of this for weight loss.  This will help a number of things including joints.   Option sildenafil .  He wishes to defer.  No problem with the reduction in ropinirole. RLS managed.  Still no complaints. Disc also dx PLMS.  The concern his wife raised about dopamine agonist resulting sometimes in compulsive and impulsive behaviors was discussed at length.  We will attempt to change to gabapentin.  Per his request and wife's concerns about DA agonists: Better with ropinirole than gabapentin   Supportive therapy and problem solving around distinguishing decision from OCD.    Disc naltrexone and wt loss given his lack of sufficient repsonse with Ozempic 90m weekly.  [facility]ed seeing [facility].  Follow-up 4 weeks per pt request  CLynder ParentsMD, DFAPA.  Please see After Visit Summary for patient specific instructions.  Future Appointments  Date Time Provider [place] 04/10/2022  1:00 PM Cottle, CBilley Co, MD [facility] None  05/10/2022  1:00 PM Cottle, CBilley Co, MD [facility] None  06/11/2022  1:00 PM Cottle, CBilley Co, MD [facility] None  07/12/2022  1:00 PM Cottle, CBilley Co, MD [facility] None     No orders of the defined types were placed in this encounter.      -------------------------------

## 2022-03-12 NOTE — Patient Instructions (Signed)
Auvelity 1 in the AM and then 1 twice daily. Reduce Lexapro to 20 mg daily.

## 2022-03-19 DIAGNOSIS — E7800 Pure hypercholesterolemia, unspecified: Secondary | ICD-10-CM | POA: Diagnosis not present

## 2022-03-19 DIAGNOSIS — Z79899 Other long term (current) drug therapy: Secondary | ICD-10-CM | POA: Diagnosis not present

## 2022-03-19 DIAGNOSIS — E559 Vitamin D deficiency, unspecified: Secondary | ICD-10-CM | POA: Diagnosis not present

## 2022-03-19 DIAGNOSIS — R7303 Prediabetes: Secondary | ICD-10-CM | POA: Diagnosis not present

## 2022-03-21 DIAGNOSIS — E7800 Pure hypercholesterolemia, unspecified: Secondary | ICD-10-CM | POA: Diagnosis not present

## 2022-03-21 DIAGNOSIS — Z79899 Other long term (current) drug therapy: Secondary | ICD-10-CM | POA: Diagnosis not present

## 2022-03-21 DIAGNOSIS — F331 Major depressive disorder, recurrent, moderate: Secondary | ICD-10-CM | POA: Diagnosis not present

## 2022-03-21 DIAGNOSIS — Z0001 Encounter for general adult medical examination with abnormal findings: Secondary | ICD-10-CM | POA: Diagnosis not present

## 2022-03-22 DIAGNOSIS — E8881 Metabolic syndrome: Secondary | ICD-10-CM | POA: Diagnosis not present

## 2022-03-22 DIAGNOSIS — G4733 Obstructive sleep apnea (adult) (pediatric): Secondary | ICD-10-CM | POA: Diagnosis not present

## 2022-03-22 DIAGNOSIS — F5081 Binge eating disorder: Secondary | ICD-10-CM | POA: Diagnosis not present

## 2022-03-22 DIAGNOSIS — Z6836 Body mass index (BMI) 36.0-36.9, adult: Secondary | ICD-10-CM | POA: Diagnosis not present

## 2022-04-02 DIAGNOSIS — Z125 Encounter for screening for malignant neoplasm of prostate: Secondary | ICD-10-CM | POA: Diagnosis not present

## 2022-04-10 ENCOUNTER — Encounter: Payer: Self-pay | Admitting: Psychiatry

## 2022-04-10 ENCOUNTER — Ambulatory Visit: Payer: Medicare Other | Admitting: Psychiatry

## 2022-04-10 DIAGNOSIS — F900 Attention-deficit hyperactivity disorder, predominantly inattentive type: Secondary | ICD-10-CM | POA: Diagnosis not present

## 2022-04-10 DIAGNOSIS — G4710 Hypersomnia, unspecified: Secondary | ICD-10-CM

## 2022-04-10 DIAGNOSIS — G4733 Obstructive sleep apnea (adult) (pediatric): Secondary | ICD-10-CM | POA: Diagnosis not present

## 2022-04-10 DIAGNOSIS — G2581 Restless legs syndrome: Secondary | ICD-10-CM

## 2022-04-10 DIAGNOSIS — G4730 Sleep apnea, unspecified: Secondary | ICD-10-CM

## 2022-04-10 DIAGNOSIS — G3184 Mild cognitive impairment, so stated: Secondary | ICD-10-CM

## 2022-04-10 DIAGNOSIS — F422 Mixed obsessional thoughts and acts: Secondary | ICD-10-CM

## 2022-04-10 DIAGNOSIS — F331 Major depressive disorder, recurrent, moderate: Secondary | ICD-10-CM | POA: Diagnosis not present

## 2022-04-10 DIAGNOSIS — R7989 Other specified abnormal findings of blood chemistry: Secondary | ICD-10-CM

## 2022-04-10 MED ORDER — METHYLPHENIDATE HCL 10 MG PO TABS: 10.0000 mg | ORAL_TABLET | Freq: Three times a day (TID) | ORAL | 0 refills | Status: DC

## 2022-04-10 MED ORDER — ALPRAZOLAM 0.25 MG PO TABS: 0.2500 mg | ORAL_TABLET | Freq: Two times a day (BID) | ORAL | 1 refills | Status: DC | PRN

## 2022-04-10 NOTE — Progress Notes (Signed)
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  Transportation Needs: Not on file  Physical Activity: Not on file  Stress: Not on file  Social Connections: Not on file  Intimate Partner Violence: Not on file    Past Medical History, Surgical history, Social history, and Family history were reviewed and updated as appropriate.   Please see review of systems for further details on the patient's review from today.   Objective:   Physical Exam:  There were no vitals taken for this visit.  Physical Exam Constitutional:      General: He is not in acute distress.    Appearance: He is obese.  Musculoskeletal:        General: No deformity.  Neurological:     Mental Status: He is alert and oriented to person, place, and time.     Cranial Nerves: No dysarthria.     Coordination: Coordination normal.  Psychiatric:        Attention and Perception: Attention and perception normal. He does not perceive auditory or visual hallucinations.        Mood and Affect: Mood is anxious and depressed. Affect is not labile, blunt, angry  or inappropriate.        Speech: Speech normal.        Behavior: Behavior normal. Behavior is cooperative.        Thought Content: Thought content normal. Thought content is not paranoid or delusional. Thought content does not include homicidal or suicidal ideation. Thought content does not include suicidal plan.        Cognition and Memory: Cognition and memory normal.        Judgment: Judgment normal.     Comments: Insight intact Residual depression  and fatigued ongoing   November 06, 2018: [place] Cog test in office within normal limits MMSE 28/30. Animal fluency 17 . (borderline) Taken as a whole, no indication to pursue neuropsychological testing.  Mini-Mental status exam 28/30 on 10/27/20.  No evidence of dementia.  Lab Review:  No results found for: "NA", "K", "CL", "CO2", "GLUCOSE", "BUN", "CREATININE", "CALCIUM", "PROT", "ALBUMIN", "AST", "ALT", "ALKPHOS", "BILITOT", "GFRNONAA", "GFRAA"  No results found for: "WBC", "RBC", "HGB", "HCT", "PLT", "MCV", "MCH", "MCHC", "RDW", "LYMPHSABS", "MONOABS", "EOSABS", "BASOSABS"  No results found for: "POCLITH", "LITHIUM"   No results found for: "PHENYTOIN", "PHENOBARB", "VALPROATE", "CBMZ"  Vitamin D level acceptable at 54.5.    Echocardiogram is stable re: AVR over the last 8 years and not likely the cause of lethargy.  .res Assessment: Plan:    Bartek was seen today for follow-up, depression and anxiety.  Diagnoses and all orders for this visit:  Major depressive disorder, recurrent episode, moderate ([place])  Mixed obsessional thoughts and acts -     ALPRAZolam (XANAX) 0.25 MG tablet; Take 1 tablet (0.25 mg total) by mouth 2 (two) times daily as needed for anxiety or sleep.  Obstructive sleep apnea  Attention deficit hyperactivity disorder (ADHD), predominantly inattentive type  Restless legs syndrome  Low vitamin D level  Mild cognitive impairment  Hypersomnia with sleep apnea  Other orders -     methylphenidate  (RITALIN) 10 MG tablet; Take 1 tablet (10 mg total) by mouth 3 (three) times daily with meals.   Greater than 50% of 30 min face to face time with patient was spent on counseling and coordination of care. We discussed Mr. Woltz has a long history of depression and OCD which are partially controlled.  He requires frequent follow-up and his request because of significant residual depression and anxiety and concerns about polypharmacy and he wants regular therapeutic advice on how to further reduce his OCD.  He believes the depression is a consequence of the residual OCD.  He has some compulsive checking and obsessions around the house maintenance.  He wishes to avoid sexual side effects and so we are keeping the SSRI at the lowest possible dose.  He is tried all of the reasonable SSRI options with the exception possibly of sertraline but it is likely to have more sexual dysfunction than what he is taking now.  When travels then tends to have less OCD bc triggered less.    Lexapro to 20 mg daily.  He tried 40 mg daily without extra benefit.  Disc extra cardiac risk.  Continue Trial Auvelity once clear what the result of sleep study is.  Sleep disorder could be cause of the hypersomnia or it could be residual depression.  Need to locate recent sleep study.  His OCD is persistent with checking lites, stove, etc. but it is not heavily time-consuming and is mildly to moderately distressing.  Overall his level of depression is mild to moderate with poor motivation to do things which bothers his wife.  Supportive therapy in solving this.Marland Kitchen  He is able to find things that he enjoys and he does have interests but they are reduced below normal for him.Marland Kitchen  He improved activity levels generally.  Discussed potential benefits, risks, and side effects of stimulants with patient to include increased heart rate, palpitations, insomnia, increased anxiety, increased irritability, or decreased appetite.  Instructed  patient to contact office if experiencing any significant tolerability issues. Disc risk of increasing the Ritalin further elevating BP and pulse if we increase  the Ritalin.  Need to verify that it's not markedly elevated from taking the Ritalin. Doesn't think it's been consistently elevated. Disc crash risk.  He doesn't need feel it.    Disc difference between different types of cognitive difficulties such as Alzheimer's disease for which there is no evidence and ADD related inattentiveness which can contribute to some of the cognitive problems that his wife identifies.  He may also have longstanding underlying ADD.  He has used Ritalin but it is inherently short acting and he is only taking it once a day.  It would seem reasonable to use a longer acting product to see if this solved some of the problems that his wife identifies.  He feels that Ritalin is helpful for energy, productivity, focus and attention. Retry Concerta 54 -72  mg bto see if it can be more effective. Check BP and agreed disc in detail.  Disc SE.   Normal B12 and folate and TSH in last couple of years.Rozell Searing memory problem relate to OCD bc often counting in the in the background at rest which tends to reduce his attention.  Ritalin is being successfully used off label to augment antidepressants for depression and have resulted in improved productivity and attention.  Previous screening of memory was not suggestive of any neuro degenerative process. Mini-Mental status exam 28/30 on 10/27/20.  No evidence of dementia.  He has lost 47 # pounds on Ozempic. Disc use of this for weight loss.  This will help a number of things including joints.   Option sildenafil .  He wishes to defer.  No problem with the reduction in ropinirole. RLS managed.  Still no complaints. Disc also dx PLMS.  The concern his wife raised about dopamine agonist resulting sometimes in compulsive and impulsive behaviors was discussed at length.  We will  attempt to change to gabapentin.  Per his request and wife's concerns about DA agonists: Better with ropinirole than gabapentin   Supportive therapy and problem solving around distinguishing decision from OCD.    Disc naltrexone and wt loss given his lack of sufficient repsonse with Ozempic 2mg  weekly.  Started seeing [employer] counseling again.  Follow-up 4 weeks per pt request  Lynder Parents MD, DFAPA.  Please see After Visit Summary for patient specific instructions.  Future Appointments  Date Time Provider [place]  05/10/2022  1:00 PM Cottle, Billey Co., MD [facility] None  06/11/2022  1:00 PM Cottle, Billey Co., MD [facility] None  07/12/2022  1:00 PM Cottle, Billey Co., MD [facility] None     No orders of the defined types were placed in this encounter.      -------------------------------

## 2022-04-19 DIAGNOSIS — F5081 Binge eating disorder: Secondary | ICD-10-CM | POA: Diagnosis not present

## 2022-04-19 DIAGNOSIS — E65 Localized adiposity: Secondary | ICD-10-CM | POA: Diagnosis not present

## 2022-04-19 DIAGNOSIS — I779 Disorder of arteries and arterioles, unspecified: Secondary | ICD-10-CM | POA: Diagnosis not present

## 2022-04-19 DIAGNOSIS — G4733 Obstructive sleep apnea (adult) (pediatric): Secondary | ICD-10-CM | POA: Diagnosis not present

## 2022-04-23 ENCOUNTER — Telehealth: Payer: Self-pay | Admitting: Psychiatry

## 2022-04-23 NOTE — Telephone Encounter (Signed)
Sleep study results for patient printed and put in your box.

## 2022-04-23 NOTE — Telephone Encounter (Signed)
Pt  LVM 3/29 @ 1:30p.  He said he thought he was going to get a message through my chart from Dr Clovis Pu about the results of his sleep study.  Pls call him.  Next appt 4/18

## 2022-04-23 NOTE — Telephone Encounter (Signed)
I did not see results of sleep study in Epic or Care Everywhere. Patient sent via MyChart and I printed out and put in Dr. [name] box.

## 2022-04-25 ENCOUNTER — Other Ambulatory Visit: Payer: Self-pay

## 2022-04-25 ENCOUNTER — Ambulatory Visit: Payer: Medicare Other | Admitting: Family Medicine

## 2022-04-25 VITALS — BP 130/78 | HR 75 | Ht 71.0 in | Wt 262.0 lb

## 2022-04-25 DIAGNOSIS — M25551 Pain in right hip: Secondary | ICD-10-CM | POA: Diagnosis not present

## 2022-04-25 DIAGNOSIS — M25552 Pain in left hip: Secondary | ICD-10-CM

## 2022-04-25 DIAGNOSIS — G894 Chronic pain syndrome: Secondary | ICD-10-CM

## 2022-04-25 NOTE — Progress Notes (Unsigned)
Shirlyn Goltz, PhD, LAT, ATC acting as a scribe for Lynne Leader, MD.  Eric [name] is a 72 y.o. male who presents to [facility] today for exacerbation of his hip pain.  Patient was last seen by Dr. Georgina Snell on 09/29/2021 and was given bilateral GT steroid injections and was referred to PT at [facility], in [place].  Today, pt reports he does not recall when his hip started hurting again. Pt suspects he has fibromyalgia, and hurts all over, but has never been dx. Pt locates pain to the lateral aspect of both hips. Pt doesn't recall the prior steroid injections providing much relief. Pt is leaving tomorrow for a trip to [place].  Pain occurs all over his hip from the anterior aspect of the lateral aspect.  Pain is more anterior however.  Pain is located bilaterally.  He is concerned he may have fibromyalgia as he has pain all over.  He has never had any medications for fibromyalgia.  Does have restless leg syndrome.  Dx imaging: 09/29/21 Bilat hips/pelvis XR 07/22/19 R & L hip XR   Pertinent review of systems: No fevers or chills  Relevant historical information: Restless leg syndrome.   Exam:  BP 130/78   Pulse 75   Ht 5\' 11"  (1.803 m)   Wt 262 lb (118.8 kg)   SpO2 95%   BMI 36.54 kg/m  General: Well Developed, well nourished, and in no acute distress.   MSK: Hips bilaterally decreased range of motion.    Lab and Radiology Results  Procedure: Real-time Ultrasound Guided Injection of right hip joint intra-articular anterior approach. Device: Philips Affiniti 50G Images permanently stored and available for review in PACS Verbal informed consent obtained.  Discussed risks and benefits of procedure. Warned about infection, bleeding, hyperglycemia damage to structures among others. Patient expresses understanding and agreement Time-out conducted.   Noted no overlying erythema, induration, or other signs of local  infection.   Skin prepped in a sterile fashion.   Local anesthesia: Topical Ethyl chloride.   With sterile technique and under real time ultrasound guidance: 40 mg of Kenalog and 2 mL Marcaine injected into hip joint. Fluid seen entering the joint capsule.   Completed without difficulty   Pain moderately  resolved suggesting accurate placement of the medication.   Advised to call if fevers/chills, erythema, induration, drainage, or persistent bleeding.   Images permanently stored and available for review in the ultrasound unit.  Impression: Technically successful ultrasound guided injection.    Procedure: Real-time Ultrasound Guided Injection of left hip joint intra-articular anterior approach Device: Philips Affiniti 50G Images permanently stored and available for review in PACS Verbal informed consent obtained.  Discussed risks and benefits of procedure. Warned about infection, bleeding, hyperglycemia damage to structures among others. Patient expresses understanding and agreement Time-out conducted.   Noted no overlying erythema, induration, or other signs of local infection.   Skin prepped in a sterile fashion.   Local anesthesia: Topical Ethyl chloride.   With sterile technique and under real time ultrasound guidance: 40 mg of Kenalog and 2 ml Marcaine injected into hip joint. Fluid seen entering the joint capsule.   Completed without difficulty   Pain moderately  resolved suggesting accurate placement of the medication.   Advised to call if fevers/chills, erythema, induration, drainage, or persistent bleeding.   Images permanently stored and available for review in the ultrasound unit.  Impression: Technically successful ultrasound guided injection.   EXAM:  DG HIP (WITH OR WITHOUT PELVIS) 5+V BILAT   COMPARISON:  Hip radiographs 07/22/2019   FINDINGS: Again seen bilateral hip joint space narrowing. There is mild bilateral acetabular spurring. Femoral heads are well seated in  the respective acetabula. No acute evidence of prior fracture. Pubic rami and remainder of the bony pelvis are intact. There is scattered enthesopathic changes at the hamstring insertions and anterior iliac spines. No erosion or avascular necrosis. Pubic symphysis and sacroiliac joints are congruent.   IMPRESSION: 1. Mild bilateral hip osteoarthritis, similar to prior exam. 2. Scattered enthesopathic changes about the pelvis.     Electronically Signed   By: Keith Rake M.D.   On: 10/01/2021 10:26 I, Lynne Leader, personally (independently) visualized and performed the interpretation of the images attached in this note.      Assessment and Plan: 72 y.o. male with bilateral hip pain thought to be due to DJD exacerbation.  There may be also component of bursitis.  He did not have great results with bursitis injections previously.  Plan for trial of intra-articular injections now.  He does have arthritis on x-ray proceed previously.  Additionally he is concerned he may have fibromyalgia.  There is a distinct possibility.  We talked about options.  He will do some research and reading about his general fibromyalgia treatment approaches.  I recommended considering Lyrica.  He will think about it and let me know.   PDMP not reviewed this encounter. Orders Placed This Encounter  Procedures   [place] LIMITED JOINT SPACE STRUCTURES LOW BILAT(NO LINKED CHARGES)    Order Specific Question:   Reason for Exam (SYMPTOM  OR DIAGNOSIS REQUIRED)    Answer:   bilateral hip pain    Order Specific Question:   Preferred imaging location?    Answer:   [place]   No orders of the defined types were placed in this encounter.    Discussed warning signs or symptoms. Please see discharge instructions. Patient expresses understanding.   The above documentation has been reviewed and is accurate and complete Lynne Leader, M.D.

## 2022-04-25 NOTE — Patient Instructions (Addendum)
Thank you for coming in today.   Let try this shots.   Let me know about lyrica (pregabalin) when you return   Pregabalin Capsules What is this medication? PREGABALIN (pre GAB a lin) treats nerve pain. It may also be used to prevent and control seizures in people with epilepsy. It works by calming overactive nerves in your body. This medicine may be used for other purposes; ask your health care provider or pharmacist if you have questions. COMMON BRAND NAME(S): Lyrica What should I tell my care team before I take this medication? They need to know if you have any of these conditions: Heart failure Kidney disease Lung disease Substance use disorder Suicidal thoughts, plans or attempt by you or a family member An unusual or allergic reaction to pregabalin, other medications, foods, dyes, or preservatives Pregnant or trying to get pregnant Breast-feeding How should I use this medication? Take this medication by mouth with water. Take it as directed on the prescription label at the same time every day. You can take it with or without food. If it upsets your stomach, take it with food. Keep taking it unless your care team tells you to stop. A special MedGuide will be given to you by the pharmacist with each prescription and refill. Be sure to read this information carefully each time. Talk to your care team about the use of this medication in children. While it may be prescribed for children as young as 1 month for selected conditions, precautions do apply. Overdosage: If you think you have taken too much of this medicine contact a poison control center or emergency room at once. NOTE: This medicine is only for you. Do not share this medicine with others. What if I miss a dose? If you miss a dose, take it as soon as you can. If it is almost time for your next dose, take only that dose. Do not take double or extra doses. What may interact with this medication? This medication may interact  with the following: Alcohol Antihistamines for allergy, cough, and cold Certain medications for anxiety or sleep Certain medications for blood pressure, heart disease Certain medications for depression like amitriptyline, fluoxetine, sertraline Certain medications for diabetes, like pioglitazone, rosiglitazone Certain medications for seizures like phenobarbital, primidone General anesthetics like halothane, isoflurane, methoxyflurane, propofol Medications that relax muscles for surgery Opioid medications for pain Phenothiazines like chlorpromazine, mesoridazine, prochlorperazine, thioridazine This list may not describe all possible interactions. Give your health care provider a list of all the medicines, herbs, non-prescription drugs, or dietary supplements you use. Also tell them if you smoke, drink alcohol, or use illegal drugs. Some items may interact with your medicine. What should I watch for while using this medication? Visit your care team for regular checks on your progress. Tell your care team if your symptoms do not start to get better or if they get worse. Do not suddenly stop taking this medication. You may develop a severe reaction. Your care team will tell you how much medication to take. If your care team wants you to stop the medication, the dose may be slowly lowered over time to avoid any side effects. This medication may affect your coordination, reaction time, or judgment. Do not drive or operate machinery until you know how this medication affects you. Sit up or stand slowly to reduce the risk of dizzy or fainting spells. Drinking alcohol with this medication can increase the risk of these side effects. If you or your family notice any  changes in your behavior, such as new or worsening depression, thoughts of harming yourself, anxiety, other unusual or disturbing thoughts, or memory loss, call your care team right away. Wear a medical ID bracelet or chain if you are taking this  medication for seizures. Carry a card that describes your condition. List the medications and doses you take on the card. This medication may make it more difficult to father a child. Talk to your care team if you are concerned about your fertility. What side effects may I notice from receiving this medication? Side effects that you should report to your care team as soon as possible: Allergic reactions or angioedema--skin rash, itching, hives, swelling of the face, eyes, lips, tongue, arms, or legs, trouble swallowing or breathing Blurry vision Thoughts of suicide or self-harm, worsening mood, feelings of depression Trouble breathing Side effects that usually do not require medical attention (report to your care team if they continue or are bothersome): Dizziness Drowsiness Dry mouth Nausea Swelling of the ankles, feet, hands Vomiting Weight gain This list may not describe all possible side effects. Call your doctor for medical advice about side effects. You may report side effects to FDA at 1-800-FDA-1088. Where should I keep my medication? Keep out of the reach of children and pets. This medication can be abused. Keep it in a safe place to protect it from theft. Do not share it with anyone. It is only for you. Selling or giving away this medication is dangerous and against the law. Store at [employer] C (77 degrees F). Get rid of any unused medication after the expiration date. This medication may cause harm and death if it is taken by other adults, children, or pets. It is important to get rid of the medication as soon as you no longer need it, or it is expired. You can do this in two ways: Take the medication to a medication take-back program. Check with your pharmacy or law enforcement to find a location. If you cannot return the medication, check the label or package insert to see if the medication should be thrown out in the garbage or flushed down the toilet. If you are not sure, ask  your care team. If it is safe to put it in the trash, take the medication out of the container. Mix the medication with cat litter, dirt, coffee grounds, or other unwanted substance. Seal the mixture in a bag or container. Put it in the trash. NOTE: This sheet is a summary. It may not cover all possible information. If you have questions about this medicine, talk to your doctor, pharmacist, or health care provider.  2023 Elsevier/Gold Standard (2020-09-30 00:00:00)

## 2022-05-10 ENCOUNTER — Ambulatory Visit: Payer: Medicare Other | Admitting: Psychiatry

## 2022-05-10 ENCOUNTER — Encounter: Payer: Self-pay | Admitting: Psychiatry

## 2022-05-10 VITALS — BP 120/70 | HR 77

## 2022-05-10 DIAGNOSIS — F422 Mixed obsessional thoughts and acts: Secondary | ICD-10-CM

## 2022-05-10 DIAGNOSIS — F900 Attention-deficit hyperactivity disorder, predominantly inattentive type: Secondary | ICD-10-CM

## 2022-05-10 DIAGNOSIS — G3184 Mild cognitive impairment, so stated: Secondary | ICD-10-CM

## 2022-05-10 DIAGNOSIS — G2581 Restless legs syndrome: Secondary | ICD-10-CM

## 2022-05-10 DIAGNOSIS — F331 Major depressive disorder, recurrent, moderate: Secondary | ICD-10-CM

## 2022-05-10 DIAGNOSIS — G4733 Obstructive sleep apnea (adult) (pediatric): Secondary | ICD-10-CM

## 2022-05-10 DIAGNOSIS — R7989 Other specified abnormal findings of blood chemistry: Secondary | ICD-10-CM

## 2022-05-10 NOTE — Progress Notes (Signed)
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    Comment: occasionally    Drug use: No   Sexual activity: Not on file  Other Topics Concern   Not on file  Social History Narrative   Not on file   Social Determinants of Health   Financial Resource Strain: Not on file  Food Insecurity: Not on file  Transportation Needs: Not on file  Physical Activity: Not on file  Stress: Not on file  Social Connections: Not on file  Intimate Partner Violence: Not on file    Past Medical History, Surgical history, Social history, and Family history were reviewed and updated as appropriate.   Please see review of systems for further details on the patient's review from today.   Objective:   Physical Exam:  BP 120/70   Pulse 77   Physical Exam Constitutional:      General: He is not in acute distress.    Appearance: He is obese.  Musculoskeletal:        General: No deformity.  Neurological:     Mental Status: He is alert and oriented to person,  place, and time.     Cranial Nerves: No dysarthria.     Coordination: Coordination normal.  Psychiatric:        Attention and Perception: Attention and perception normal. He does not perceive auditory or visual hallucinations.        Mood and Affect: Mood is anxious and depressed. Affect is not labile, blunt, angry or inappropriate.        Speech: Speech normal.        Behavior: Behavior normal. Behavior is cooperative.  Thought Content: Thought content normal. Thought content is not paranoid or delusional. Thought content does not include homicidal or suicidal ideation. Thought content does not include suicidal plan.        Cognition and Memory: Cognition and memory normal.        Judgment: Judgment normal.     Comments: Insight intact Residual depression and fatigue is better with Concerta 72 AM   November 06, 2018: [place] Cog test in office within normal limits MMSE 28/30. Animal fluency 17 . (borderline) Taken as a whole, no indication to pursue neuropsychological testing.  Mini-Mental status exam 28/30 on 10/27/20.  No evidence of dementia.  Lab Review:  No results found for: "NA", "K", "CL", "CO2", "GLUCOSE", "BUN", "CREATININE", "CALCIUM", "PROT", "ALBUMIN", "AST", "ALT", "ALKPHOS", "BILITOT", "GFRNONAA", "GFRAA"  No results found for: "WBC", "RBC", "HGB", "HCT", "PLT", "MCV", "MCH", "MCHC", "RDW", "LYMPHSABS", "MONOABS", "EOSABS", "BASOSABS"  No results found for: "POCLITH", "LITHIUM"   No results found for: "PHENYTOIN", "PHENOBARB", "VALPROATE", "CBMZ"  Vitamin D level acceptable at 54.5.    Echocardiogram is stable re: AVR over the last 8 years and not likely the cause of lethargy.  .res Assessment: Plan:    Oda was seen today for follow-up, depression, anxiety, fatigue and sleeping problem.  Diagnoses and all orders for this visit:  Major depressive disorder, recurrent episode, moderate  Mixed obsessional thoughts and acts  Obstructive sleep  apnea  Attention deficit hyperactivity disorder (ADHD), predominantly inattentive type  Restless legs syndrome  Low vitamin D level  Mild cognitive impairment   Greater than 50% of 50 min face to face time with patient was spent on counseling and coordination of care. We discussed Mr. Kosh has a long history of depression and OCD which are partially controlled.  He requires frequent follow-up and his request because of significant residual depression and anxiety and concerns about polypharmacy and he wants regular therapeutic advice on how to further reduce his OCD.  He believes the depression is a consequence of the residual OCD.  He has some compulsive checking and obsessions around the house maintenance.  He wishes to avoid sexual side effects and so we are keeping the SSRI at the lowest possible dose.  He is tried all of the reasonable SSRI options with the exception possibly of sertraline but it is likely to have more sexual dysfunction than what he is taking now.  When travels then tends to have less OCD bc triggered less.    Lexapro to 20 mg daily.  He tried 40 mg daily without extra benefit.  Disc extra cardiac risk.  DC Auvelity DT NR  His OCD is persistent with checking lites, stove, etc. but it is not heavily time-consuming and is mildly to moderately distressing.  Overall his level of depression is mild to moderate with poor motivation to do things which bothers his wife.  Supportive therapy in solving this.Marland Kitchen  He is able to find things that he enjoys and he does have interests but they are reduced below normal for him.Marland Kitchen  He improved activity levels generally.  And He feels that Ritalin is helpful for energy, productivity, focus and attention.  Fearful about potential BP effects Better Concerta 72  mg and substantially more alert and awake  , more active Checked BP and normal and agreed disc in detail.   Discussed potential benefits, risks, and side effects of stimulants with  patient to include increased heart rate, palpitations, insomnia, increased anxiety, increased irritability, or decreased appetite.  Instructed patient  to contact office if experiencing any significant tolerability issues. Disc risk of increasing the Ritalin further elevating BP and pulse if we increase the Ritalin.  Need to verify that it's not markedly elevated from taking the Ritalin. Doesn't think it's been consistently elevated. Disc crash risk.  He doesn't need feel it.    Disc difference between different types of cognitive difficulties such as Alzheimer's disease for which there is no evidence and ADD related inattentiveness which can contribute to some of the cognitive problems that his wife identifies.  He may also have longstanding underlying ADD.  He has used Ritalin but it is inherently short acting and he is only taking it once a day.  It would seem reasonable to use a longer acting product to see if this solved some of the problems that his wife identifies.  Extensive discussion of sleep study and he has a copy.  Whys is there virtually no N3 & REM sleep?  Is that affecting daytime alertness and fatigue?  Vs how much is related to mild OSA and PLMS?  Disc this in detail.  Wrote coreespondence with sleep doc about it.  Options for sleep & fatigue:  Focus on reduction in OSA Focus on improving deep stage sleep.  ? Rx low dose mirtazapine or alternatives Focus on leg movements (hx RLS/PLMS) ? Trial as has been suggested Lyrica for FM type sx and may help RLS/PLMS  Disc SE.   Normal B12 and folate and TSH in last couple of years.Lisabeth Devoid memory problem relate to OCD bc often counting in the in the background at rest which tends to reduce his attention.  Ritalin is being successfully used off label to augment antidepressants for depression and have resulted in improved productivity and attention.  Previous screening of memory was not suggestive of any neuro degenerative  process. Mini-Mental status exam 28/30 on 10/27/20.  No evidence of dementia.  He has lost 47 # pounds on Ozempic. Disc use of this for weight loss.  This will help a number of things including joints.   Option sildenafil .  He wishes to defer.  No problem with the reduction in ropinirole. RLS managed.  Still no complaints. Disc also dx PLMS.  The concern his wife raised about dopamine agonist resulting sometimes in compulsive and impulsive behaviors was discussed at length.  We will attempt to change to gabapentin.  Per his request and wife's concerns about DA agonists: Better with ropinirole than gabapentin   Supportive therapy and problem solving around distinguishing decision from OCD.    Disc naltrexone and wt loss given his lack of sufficient repsonse with Ozempic 2mg  weekly.  Started seeing [facility] counseling again.  Follow-up 4 weeks per pt request  Meredith Staggers MD, DFAPA.  Please see After Visit Summary for patient specific instructions.  Future Appointments  Date Time Provider Department Center  06/11/2022  1:00 PM Cottle, Steva Ready., MD [facility] None  07/12/2022  1:00 PM Cottle, Steva Ready., MD [facility] None     No orders of the defined types were placed in this encounter.      -------------------------------

## 2022-05-10 NOTE — Patient Instructions (Signed)
Extensive discussion of sleep study and he has a copy.  Whys is there virtually no N3 & REM sleep?  Is that affecting daytime alertness and fatigue?  Vs how much is related to mild OSA and PLMS?  Options for sleep & fatigue:  Focus on reduction in OSA Focus on improving deep stage sleep.  ? Rx low dose mirtazapine or alternatives Focus on leg movements (hx RLS/PLMS) ? Trial as has been suggested Lyrica for FM type sx and may help RLS/PLMS

## 2022-05-14 ENCOUNTER — Other Ambulatory Visit: Payer: Self-pay | Admitting: Psychiatry

## 2022-05-14 DIAGNOSIS — G2581 Restless legs syndrome: Secondary | ICD-10-CM | POA: Diagnosis not present

## 2022-05-14 DIAGNOSIS — G4733 Obstructive sleep apnea (adult) (pediatric): Secondary | ICD-10-CM | POA: Diagnosis not present

## 2022-05-22 NOTE — Telephone Encounter (Signed)
Patient sent message thru MyChart.  Dr. Jennelle Human, please read the following in the attached phr.  Dr Betti Cruz preferred that Dr. Docia Chuck make the switch to  Lyrica but I prefer you b/c of the ease of access. Let me know if you will do it and if I should stop the ropinerol.  See the following problems beginning at the top of page 3.  Restless legs syydrome  And  Arthralgia of the pelvic region and thigh ([id number])

## 2022-05-24 DIAGNOSIS — F909 Attention-deficit hyperactivity disorder, unspecified type: Secondary | ICD-10-CM | POA: Diagnosis not present

## 2022-05-24 DIAGNOSIS — F5081 Binge eating disorder: Secondary | ICD-10-CM | POA: Diagnosis not present

## 2022-05-24 DIAGNOSIS — G2581 Restless legs syndrome: Secondary | ICD-10-CM | POA: Diagnosis not present

## 2022-05-24 DIAGNOSIS — G4733 Obstructive sleep apnea (adult) (pediatric): Secondary | ICD-10-CM | POA: Diagnosis not present

## 2022-05-24 DIAGNOSIS — E669 Obesity, unspecified: Secondary | ICD-10-CM | POA: Diagnosis not present

## 2022-05-24 DIAGNOSIS — Z6834 Body mass index (BMI) 34.0-34.9, adult: Secondary | ICD-10-CM | POA: Diagnosis not present

## 2022-06-11 ENCOUNTER — Ambulatory Visit: Payer: Medicare Other | Admitting: Psychiatry

## 2022-06-11 ENCOUNTER — Encounter: Payer: Self-pay | Admitting: Psychiatry

## 2022-06-11 DIAGNOSIS — D1801 Hemangioma of skin and subcutaneous tissue: Secondary | ICD-10-CM | POA: Diagnosis not present

## 2022-06-11 DIAGNOSIS — L02425 Furuncle of right lower limb: Secondary | ICD-10-CM | POA: Diagnosis not present

## 2022-06-11 DIAGNOSIS — F900 Attention-deficit hyperactivity disorder, predominantly inattentive type: Secondary | ICD-10-CM | POA: Diagnosis not present

## 2022-06-11 DIAGNOSIS — G2581 Restless legs syndrome: Secondary | ICD-10-CM

## 2022-06-11 DIAGNOSIS — F422 Mixed obsessional thoughts and acts: Secondary | ICD-10-CM

## 2022-06-11 DIAGNOSIS — F331 Major depressive disorder, recurrent, moderate: Secondary | ICD-10-CM | POA: Diagnosis not present

## 2022-06-11 DIAGNOSIS — G4710 Hypersomnia, unspecified: Secondary | ICD-10-CM

## 2022-06-11 DIAGNOSIS — G4733 Obstructive sleep apnea (adult) (pediatric): Secondary | ICD-10-CM

## 2022-06-11 DIAGNOSIS — L821 Other seborrheic keratosis: Secondary | ICD-10-CM | POA: Diagnosis not present

## 2022-06-11 DIAGNOSIS — G4730 Sleep apnea, unspecified: Secondary | ICD-10-CM

## 2022-06-11 DIAGNOSIS — R7989 Other specified abnormal findings of blood chemistry: Secondary | ICD-10-CM

## 2022-06-11 MED ORDER — METHYLPHENIDATE HCL 10 MG PO TABS: 10.0000 mg | ORAL_TABLET | Freq: Three times a day (TID) | ORAL | 0 refills | Status: DC

## 2022-06-11 MED ORDER — PREGABALIN 50 MG PO CAPS: 50.0000 mg | ORAL_CAPSULE | Freq: Two times a day (BID) | ORAL | 0 refills | Status: DC

## 2022-06-11 NOTE — Progress Notes (Signed)
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   Anxiety disorder Mother    Breast cancer Mother    Ovarian cancer Mother    Depression Father        bi-polar   Hyperlipidemia Father    Heart disease Father    Sudden death Father    Bipolar disorder Father    Sleep apnea Father    Obesity Father    Depression Son    Colon cancer Neg Hx    Colon polyps Neg Hx    Esophageal cancer Neg Hx    Rectal cancer Neg Hx    Stomach cancer Neg Hx     Social History   Socioeconomic History   Marital status: Married    Spouse name: Not on file   Number of children: Not on file   Years of education: Not on file   Highest education level: Not on file  Occupational History   Occupation: retired Pensions consultant  Tobacco Use   Smoking status: Never   Smokeless tobacco: Never  Substance and Sexual Activity   Alcohol use: Yes    Comment: occasionally    Drug use: No   Sexual activity: Not on file  Other Topics Concern   Not on file  Social History Narrative   Not on file   Social Determinants of Health    Financial Resource Strain: Not on file  Food Insecurity: Not on file  Transportation Needs: Not on file  Physical Activity: Not on file  Stress: Not on file  Social Connections: Not on file  Intimate Partner Violence: Not on file    Past Medical History, Surgical history, Social history, and Family history were reviewed and updated as appropriate.   Please see review of systems for further details on the patient's review from today.   Objective:   Physical Exam:  There were  no vitals taken for this visit.  Physical Exam Constitutional:      General: He is not in acute distress.    Appearance: He is obese.  Musculoskeletal:        General: No deformity.  Neurological:     Mental Status: He is alert and oriented to person, place, and time.     Cranial Nerves: No dysarthria.     Coordination: Coordination normal.  Psychiatric:        Attention and Perception: Attention and perception normal. He does not perceive auditory or visual hallucinations.        Mood and Affect: Mood is anxious and depressed. Affect is not labile, blunt, angry or inappropriate.        Speech: Speech normal.        Behavior: Behavior normal. Behavior is cooperative.        Thought Content: Thought content normal. Thought content is not paranoid or delusional. Thought content does not include homicidal or suicidal ideation. Thought content does not include suicidal plan.        Cognition and Memory: Cognition and memory normal.        Judgment: Judgment normal.     Comments: Insight intact Residual depression and fatigue was better with Concerta 72 AM but DT delayed sleep phase is skipping it for now   November 06, 2018: [place] Cog test in office within normal limits MMSE 28/30. Animal fluency 17 . (borderline) Taken as a whole, no indication to pursue neuropsychological testing.  Mini-Mental status exam 28/30 on 10/27/20.  No evidence of dementia.  Lab Review:  No results found for: "NA", "K",  "CL", "CO2", "GLUCOSE", "BUN", "CREATININE", "CALCIUM", "PROT", "ALBUMIN", "AST", "ALT", "ALKPHOS", "BILITOT", "GFRNONAA", "GFRAA"  No results found for: "WBC", "RBC", "HGB", "HCT", "PLT", "MCV", "MCH", "MCHC", "RDW", "LYMPHSABS", "MONOABS", "EOSABS", "BASOSABS"  No results found for: "POCLITH", "LITHIUM"   No results found for: "PHENYTOIN", "PHENOBARB", "VALPROATE", "CBMZ"  Vitamin D level acceptable at 54.5.    Echocardiogram is stable re: AVR over the last 8 years and not likely the cause of lethargy.  .res Assessment: Plan:    Demarus was seen today for follow-up, depression and anxiety.  Diagnoses and all orders for this visit:  Major depressive disorder, recurrent episode, moderate (HCC)  Mixed obsessional thoughts and acts  Attention deficit hyperactivity disorder (ADHD), predominantly inattentive type -     methylphenidate (RITALIN) 10 MG tablet; Take 1 tablet (10 mg total) by mouth 3 (three) times daily with meals.  Restless legs syndrome -     pregabalin (LYRICA) 50 MG capsule; Take 1 capsule (50 mg total) by mouth 2 (two) times daily.  Obstructive sleep apnea  Low vitamin D level  Hypersomnia with sleep apnea -     pregabalin (LYRICA) 50 MG capsule; Take 1 capsule (50 mg total) by mouth 2 (two) times daily.   Greater than 50% of 50 min face to face time with patient was spent on counseling and coordination of care. We discussed Mr. Hinger has a long history of depression and OCD which are partially controlled.  He requires frequent follow-up and his request because of significant residual depression and anxiety and concerns about polypharmacy and he wants regular therapeutic advice on how to further reduce his OCD.  He believes the depression is a consequence of the residual OCD.  He has some compulsive checking and obsessions around the house maintenance.  He wishes to avoid sexual side effects and so we are keeping the  SSRI at the lowest possible dose.  He is  tried all of the reasonable SSRI options with the exception possibly of sertraline but it is likely to have more sexual dysfunction than what he is taking now.  When travels then tends to have less OCD bc triggered less.    Lexapro to 20 mg daily.  He tried 40 mg daily without extra benefit.  Disc extra cardiac risk.  His OCD is persistent with checking lites, stove, etc. but it is not heavily time-consuming and is mildly to moderately distressing.  Overall his level of depression is mild to moderate with poor motivation to do things which bothers his wife.  Supportive therapy in solving this.Marland Kitchen  He is able to find things that he enjoys and he does have interests but they are reduced below normal for him.Marland Kitchen  He improved activity levels generally.  And He feels that Ritalin is helpful for energy, productivity, focus and attention.  Fearful about potential BP effects He wants to stay at the lower dose Ritalin 30 mg AM to protect sleep. Bc delayed sleep phase.  Discussed potential benefits, risks, and side effects of stimulants with patient to include increased heart rate, palpitations, insomnia, increased anxiety, increased irritability, or decreased appetite.  Instructed patient to contact office if experiencing any significant tolerability issues. Disc risk of increasing the Ritalin further elevating BP and pulse if we increase the Ritalin.  Need to verify that it's not markedly elevated from taking the Ritalin. Doesn't think it's been consistently elevated. Disc crash risk.  He doesn't need feel it.    Disc difference between different types of cognitive difficulties such as Alzheimer's disease for which there is no evidence and ADD related inattentiveness which can contribute to some of the cognitive problems that his wife identifies.  He may also have longstanding underlying ADD.  He has used Ritalin but it is inherently short acting and he is only taking it once a day.  It would seem reasonable to use  a longer acting product to see if this solved some of the problems that his wife identifies.  Extensive discussion of sleep study and he has a copy.  Whys is there virtually no N3 & REM sleep?  Is that affecting daytime alertness and fatigue?  Vs how much is related to mild OSA and PLMS?  Disc this in detail.  Wrote coreespondence with sleep doc about it.  Options for sleep & fatigue:  Focus on reduction in OSA Focus on improving deep stage sleep.  ? Rx low dose mirtazapine or alternatives Focus on leg movements (hx RLS/PLMS) ? Trial as has been suggested Lyrica for FM type sx and may help RLS/PLMS  Disc SE.   Normal B12 and folate and TSH in last couple of years.Lisabeth Devoid memory problem relate to OCD bc often counting in the in the background at rest which tends to reduce his attention.  Ritalin is being successfully used off label to augment antidepressants for depression and have resulted in improved productivity and attention.  Previous screening of memory was not suggestive of any neuro degenerative process. Mini-Mental status exam 28/30 on 10/27/20.  No evidence of dementia.  He has lost 47 # pounds on Ozempic. Disc use of this for weight loss.  This will help a number of things including joints.   Option sildenafil .  He wishes to defer.  No problem with the reduction in ropinirole. RLS managed.  Still no complaints. Disc also dx PLMS.  The concern his wife raised about dopamine agonist resulting sometimes in compulsive and impulsive behaviors was discussed at length.  We will attempt to change to gabapentin.  Per his request and wife's concerns about DA agonists: Ok so far with change to Lyrica 50 mg and will try increasing it to help sleep quality and hopefully mood and cognition.   Increase to 100 mg HS.  If not helpful for mood enough  then consider switch to Cymbalta.  Supportive therapy and problem solving around distinguishing decision from OCD.    Disc naltrexone and wt  loss given his lack of sufficient repsonse with Ozempic 2mg  weekly.  Started seeing [facility] counseling again.  Follow-up 4 weeks per pt request  Meredith Staggers MD, DFAPA.  Please see After Visit Summary for patient specific instructions.  Future Appointments  Date Time Provider Department Center  07/12/2022  1:00 PM Cottle, Steva Ready., MD [facility] None  08/15/2022  1:00 PM Cottle, Steva Ready., MD [facility] None  09/13/2022  1:30 PM Cottle, Steva Ready., MD [facility] None  10/16/2022  1:00 PM Cottle, Steva Ready., MD [facility] None  11/15/2022  1:00 PM Cottle, Steva Ready., MD [facility] None  12/18/2022  1:00 PM Cottle, Steva Ready., MD [facility] None  01/21/2023  1:00 PM Cottle, Steva Ready., MD [facility] None     No orders of the defined types were placed in this encounter.      -------------------------------

## 2022-06-17 ENCOUNTER — Other Ambulatory Visit: Payer: Self-pay | Admitting: Psychiatry

## 2022-07-01 DIAGNOSIS — A09 Infectious gastroenteritis and colitis, unspecified: Secondary | ICD-10-CM | POA: Diagnosis not present

## 2022-07-06 ENCOUNTER — Other Ambulatory Visit: Payer: Self-pay | Admitting: Psychiatry

## 2022-07-06 DIAGNOSIS — F422 Mixed obsessional thoughts and acts: Secondary | ICD-10-CM

## 2022-07-06 DIAGNOSIS — R197 Diarrhea, unspecified: Secondary | ICD-10-CM | POA: Diagnosis not present

## 2022-07-09 DIAGNOSIS — R197 Diarrhea, unspecified: Secondary | ICD-10-CM | POA: Diagnosis not present

## 2022-07-12 ENCOUNTER — Encounter: Payer: Self-pay | Admitting: Psychiatry

## 2022-07-12 ENCOUNTER — Ambulatory Visit: Payer: Medicare Other | Admitting: Psychiatry

## 2022-07-12 DIAGNOSIS — F331 Major depressive disorder, recurrent, moderate: Secondary | ICD-10-CM | POA: Diagnosis not present

## 2022-07-12 DIAGNOSIS — G4730 Sleep apnea, unspecified: Secondary | ICD-10-CM

## 2022-07-12 DIAGNOSIS — E669 Obesity, unspecified: Secondary | ICD-10-CM | POA: Diagnosis not present

## 2022-07-12 DIAGNOSIS — F900 Attention-deficit hyperactivity disorder, predominantly inattentive type: Secondary | ICD-10-CM

## 2022-07-12 DIAGNOSIS — R7989 Other specified abnormal findings of blood chemistry: Secondary | ICD-10-CM

## 2022-07-12 DIAGNOSIS — Z6834 Body mass index (BMI) 34.0-34.9, adult: Secondary | ICD-10-CM | POA: Diagnosis not present

## 2022-07-12 DIAGNOSIS — G3184 Mild cognitive impairment, so stated: Secondary | ICD-10-CM

## 2022-07-12 DIAGNOSIS — R197 Diarrhea, unspecified: Secondary | ICD-10-CM | POA: Diagnosis not present

## 2022-07-12 DIAGNOSIS — G2581 Restless legs syndrome: Secondary | ICD-10-CM | POA: Diagnosis not present

## 2022-07-12 DIAGNOSIS — G4710 Hypersomnia, unspecified: Secondary | ICD-10-CM | POA: Diagnosis not present

## 2022-07-12 DIAGNOSIS — F909 Attention-deficit hyperactivity disorder, unspecified type: Secondary | ICD-10-CM | POA: Diagnosis not present

## 2022-07-12 DIAGNOSIS — G4733 Obstructive sleep apnea (adult) (pediatric): Secondary | ICD-10-CM | POA: Diagnosis not present

## 2022-07-12 DIAGNOSIS — F422 Mixed obsessional thoughts and acts: Secondary | ICD-10-CM | POA: Diagnosis not present

## 2022-07-12 DIAGNOSIS — F5221 Male erectile disorder: Secondary | ICD-10-CM

## 2022-07-12 MED ORDER — PREGABALIN 150 MG PO CAPS
150.0000 mg | ORAL_CAPSULE | Freq: Every evening | ORAL | 1 refills | Status: DC
Start: 2022-07-12 — End: 2022-08-15

## 2022-07-12 NOTE — Progress Notes (Signed)
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     Other reaction(s): cramps    Past Medical History:  Diagnosis Date   Allergy    Anemia    iron- pt denies    Anxiety    Aortic cusp regurgitation    Carotid artery occlusion    Constipation    Coronary artery stenosis    Hyperlipidemia    Lactose intolerance    Major depression, recurrent, chronic (HCC)    Obesity    OCD (obsessive compulsive disorder)    OSA (obstructive sleep apnea)    Other chronic pain    Periodic limb movement disorder    Periodic limb movements of sleep    Prediabetes    Pure hypercholesterolemia    Restless legs    Sleep apnea    wears cpap    Vitamin D deficiency     Family History  Problem Relation Age of Onset   Cancer Mother        breast and ovarian   Anxiety disorder Mother    Breast cancer Mother    Ovarian cancer Mother    Depression Father        bi-polar   Hyperlipidemia Father    Heart disease Father    Sudden death Father    Bipolar disorder Father    Sleep apnea Father    Obesity Father     Depression Son    Colon cancer Neg Hx    Colon polyps Neg Hx    Esophageal cancer Neg Hx    Rectal cancer Neg Hx    Stomach cancer Neg Hx     Social History   Socioeconomic History   Marital status: Married    Spouse name: Not on file   Number of children: Not on file   Years of education: Not on file   Highest education level: Not on file  Occupational History   Occupation: retired Pensions consultant  Tobacco Use   Smoking status: Never   Smokeless tobacco: Never  Substance  and Sexual Activity   Alcohol use: Yes    Comment: occasionally    Drug use: No   Sexual activity: Not on file  Other Topics Concern   Not on file  Social History Narrative   Not on file   Social Determinants of Health   Financial Resource Strain: Not on file  Food Insecurity: Not on file  Transportation Needs: Not on file  Physical Activity: Not on file  Stress: Not on file  Social Connections: Not on file  Intimate Partner Violence: Not on file    Past Medical History, Surgical history, Social history, and Family history were reviewed and updated as appropriate.   Please see review of systems for further details on the patient's review from today.   Objective:   Physical Exam:  There were no vitals taken for this visit.  Physical Exam Constitutional:      General: He is not in acute distress.    Appearance: He is obese.  Musculoskeletal:        General: No deformity.  Neurological:     Mental Status: He is alert and oriented to person, place, and time.     Cranial Nerves: No dysarthria.     Coordination: Coordination normal.  Psychiatric:        Attention and Perception: Attention and perception normal. He does not perceive auditory or visual hallucinations.        Mood and Affect: Mood is anxious and depressed. Affect is not labile, blunt, angry or inappropriate.        Speech: Speech normal.        Behavior: Behavior normal. Behavior is cooperative.        Thought Content: Thought  content normal. Thought content is not paranoid or delusional. Thought content does not include homicidal or suicidal ideation. Thought content does not include suicidal plan.        Cognition and Memory: Cognition and memory normal.        Judgment: Judgment normal.     Comments: Insight intact Residual depression and fatigue    November 06, 2018: [place] Cog test in office within normal limits MMSE 28/30. Animal fluency 17 . (borderline) Taken as a whole, no indication to pursue neuropsychological testing.  Mini-Mental status exam 28/30 on 10/27/20.  No evidence of dementia.  Lab Review:   Vitamin D level acceptable at 54.5.   Normal B12 and folate and TSH in last couple of years..  Echocardiogram is stable re: AVR over the last 8 years and not likely the cause of lethargy.  .res Assessment: Plan:    Rayshawn was seen today for follow-up, depression, anxiety, sleeping problem and fatigue.  Diagnoses and all orders for this visit:  Major depressive disorder, recurrent episode, moderate (HCC)  Hypersomnia with sleep apnea -     pregabalin (LYRICA) 150 MG capsule; Take 1 capsule (150 mg total) by mouth at bedtime.  Restless legs syndrome -     pregabalin (LYRICA) 150 MG capsule; Take 1 capsule (150 mg total) by mouth at bedtime.  Mixed obsessional thoughts and acts  Attention deficit hyperactivity disorder (ADHD), predominantly inattentive type  Low vitamin D level  Mild cognitive impairment  Erectile disorder, acquired, generalized, moderate   30 min face to face time with patient was spent on counseling and coordination of care. We discussed Mr. Mastromarino has a long history of depression and OCD which are partially controlled.  He requires frequent follow-up and his request because of significant residual depression  and anxiety and concerns about polypharmacy and he wants regular therapeutic advice on how to further reduce his OCD.  He believes the depression is a  consequence of the residual OCD.  He has some compulsive checking and obsessions around the house maintenance.  He wishes to avoid sexual side effects and so we are keeping the SSRI at the lowest possible dose.  He is tried all of the reasonable SSRI options with the exception possibly of sertraline but it is likely to have more sexual dysfunction than what he is taking now.  When travels then tends to have less OCD bc triggered less.    Lexapro to 20 mg daily.  He tried 40 mg daily without extra benefit.  Disc extra cardiac risk at higher dose so will keep it at 20 if needed.  His OCD is persistent with checking lites, stove, etc. but it is not heavily time-consuming and is mildly to moderately distressing.  Overall his level of depression is mild to moderate with poor motivation to do things which bothers his wife.  Supportive therapy in solving this.Marland Kitchen  He is able to find things that he enjoys and he does have interests but they are reduced below normal for him.Marland Kitchen  He improved activity levels generally.  And He feels that Ritalin is helpful for energy, productivity, focus and attention.  Fearful about potential BP effects He wants to stay at the lower dose Ritalin 30 mg AM to protect sleep. Bc delayed sleep phase. Hold stimulant if ill and not able to benefit.  Discussed potential benefits, risks, and side effects of stimulants with patient to include increased heart rate, palpitations, insomnia, increased anxiety, increased irritability, or decreased appetite.  Instructed patient to contact office if experiencing any significant tolerability issues. Disc risk of increasing the Ritalin further elevating BP and pulse if we increase the Ritalin.  Need to verify that it's not markedly elevated from taking the Ritalin. Doesn't think it's been consistently elevated. Disc crash risk.  He doesn't need feel it causes crash.  Disc difference between different types of cognitive difficulties such as Alzheimer's  disease for which there is no evidence and ADD related inattentiveness which can contribute to some of the cognitive problems that his wife identifies.  He may also have longstanding underlying ADD.  He has used Ritalin but it is inherently short acting and he is only taking it once a day.  I  Extensive discussion of sleep study and he has a copy.  Whys is there virtually no N3 & REM sleep?  Is that affecting daytime alertness and fatigue?  Vs how much is related to mild OSA and PLMS?  Disc this in detail.  Wrote coreespondence with sleep doc about it.  Options for sleep & fatigue:  Difficult to assess  bc has been [place] of [place] and then sick for 3 weeks.   Focus on reduction in OSA Focus on improving deep stage sleep.  ? Rx low dose mirtazapine or alternatives Focus on leg movements (hx RLS/PLMS) continue Trial as had been suggested Lyrica for FM type sx and may help RLS/PLMS.  Better dreaming on 100 mg HS and wants to try 150.  Increase to 150 mg HS for sleep and back pain..  Disc SE.  Not having any  Use LED Xanax and try to avoid BZ daytime bc fatigue.  He doesn't use it much daythime.  Using 0..25-0.5 mg at night.  May need less with more Lyrica at [facility] and try  to minimize.  Thinks memory problem relate to OCD bc often counting in the in the background at rest which tends to reduce his attention.  Ritalin is being successfully used off label to augment antidepressants for depression and have resulted in improved productivity and attention.  Previous screening of memory was not suggestive of any neuro degenerative process. Mini-Mental status exam 28/30 on 10/27/20.  No evidence of dementia.  He has lost 47 # pounds on Ozempic. Disc use of this for weight loss.  This will help a number of things including joints.   Option sildenafil .  He wishes to defer.  No problem with the reduction in ropinirole. RLS managed.  Still no complaints. Disc also dx PLMS.  Wife denies noticing it  lately.  consider switch to Cymbalta.  Supportive therapy and problem solving around distinguishing decision from OCD.  [place] getting back to exercise.  Disc naltrexone and wt loss given his lack of sufficient repsonse with Ozempic 2mg  weekly.  Disc dosing.  He's going to discuss with wt loss center.  Started seeing [facility] counseling again.  Follow-up 4 weeks per pt request  Meredith Staggers MD, DFAPA.  Please see After Visit Summary for patient specific instructions.  Future Appointments  Date Time Provider Department Center  08/15/2022  1:00 PM Cottle, Steva Ready., MD [facility] None  09/13/2022  1:30 PM Cottle, Steva Ready., MD [facility] None  10/16/2022  1:00 PM Cottle, Steva Ready., MD [facility] None  11/15/2022  1:00 PM Cottle, Steva Ready., MD [facility] None  12/18/2022  1:00 PM Cottle, Steva Ready., MD [facility] None  01/21/2023  1:00 PM Cottle, Steva Ready., MD [facility] None     No orders of the defined types were placed in this encounter.      -------------------------------

## 2022-08-07 DIAGNOSIS — Z6835 Body mass index (BMI) 35.0-35.9, adult: Secondary | ICD-10-CM | POA: Diagnosis not present

## 2022-08-07 DIAGNOSIS — K5903 Drug induced constipation: Secondary | ICD-10-CM | POA: Diagnosis not present

## 2022-08-07 DIAGNOSIS — I779 Disorder of arteries and arterioles, unspecified: Secondary | ICD-10-CM | POA: Diagnosis not present

## 2022-08-13 ENCOUNTER — Other Ambulatory Visit: Payer: Self-pay | Admitting: Psychiatry

## 2022-08-13 DIAGNOSIS — G2581 Restless legs syndrome: Secondary | ICD-10-CM

## 2022-08-13 DIAGNOSIS — G4733 Obstructive sleep apnea (adult) (pediatric): Secondary | ICD-10-CM | POA: Diagnosis not present

## 2022-08-14 ENCOUNTER — Other Ambulatory Visit: Payer: Self-pay | Admitting: Psychiatry

## 2022-08-14 DIAGNOSIS — G4710 Hypersomnia, unspecified: Secondary | ICD-10-CM

## 2022-08-14 DIAGNOSIS — G2581 Restless legs syndrome: Secondary | ICD-10-CM

## 2022-08-15 ENCOUNTER — Encounter: Payer: Self-pay | Admitting: Psychiatry

## 2022-08-15 ENCOUNTER — Ambulatory Visit: Payer: Medicare Other | Admitting: Psychiatry

## 2022-08-15 VITALS — BP 138/74 | HR 76

## 2022-08-15 DIAGNOSIS — G4710 Hypersomnia, unspecified: Secondary | ICD-10-CM | POA: Diagnosis not present

## 2022-08-15 DIAGNOSIS — G4733 Obstructive sleep apnea (adult) (pediatric): Secondary | ICD-10-CM

## 2022-08-15 DIAGNOSIS — G3184 Mild cognitive impairment, so stated: Secondary | ICD-10-CM

## 2022-08-15 DIAGNOSIS — F422 Mixed obsessional thoughts and acts: Secondary | ICD-10-CM | POA: Diagnosis not present

## 2022-08-15 DIAGNOSIS — F331 Major depressive disorder, recurrent, moderate: Secondary | ICD-10-CM | POA: Diagnosis not present

## 2022-08-15 DIAGNOSIS — G4730 Sleep apnea, unspecified: Secondary | ICD-10-CM

## 2022-08-15 DIAGNOSIS — F900 Attention-deficit hyperactivity disorder, predominantly inattentive type: Secondary | ICD-10-CM | POA: Diagnosis not present

## 2022-08-15 DIAGNOSIS — G2581 Restless legs syndrome: Secondary | ICD-10-CM

## 2022-08-15 DIAGNOSIS — R7989 Other specified abnormal findings of blood chemistry: Secondary | ICD-10-CM

## 2022-08-15 DIAGNOSIS — G8929 Other chronic pain: Secondary | ICD-10-CM

## 2022-08-15 MED ORDER — PREGABALIN 100 MG PO CAPS
100.0000 mg | ORAL_CAPSULE | Freq: Every evening | ORAL | 0 refills | Status: AC
Start: 2022-08-15 — End: ?

## 2022-08-15 NOTE — Progress Notes (Signed)
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 Inject into the skin.     simvastatin (ZOCOR) 20 MG tablet Take 20 mg by mouth at bedtime.     pregabalin (LYRICA) 100 MG capsule Take 1 capsule (100 mg total) by mouth at bedtime. 90 capsule 0   rOPINIRole (REQUIP) 1 MG tablet TAKE 3 TABLETS (3 MG TOTAL) BY MOUTH AT BEDTIME. (Patient not taking: Reported on 08/15/2022) 270 tablet 0   No current facility-administered medications for this visit.    Medication Side Effects: None sexual SE are better not  All gone.  Allergies:  Allergies  Allergen Reactions   E.E.S. [Erythromycin] Hives   Macrolides And Ketolides Other (See Comments)    EES    Rosuvastatin     Other reaction(s): cramps    Past Medical History:  Diagnosis Date   Allergy    Anemia    iron- pt denies    Anxiety    Aortic cusp regurgitation    Carotid artery occlusion    Constipation    Coronary artery stenosis    Hyperlipidemia    Lactose intolerance    Major depression, recurrent, chronic (HCC)     Obesity    OCD (obsessive compulsive disorder)    OSA (obstructive sleep apnea)    Other chronic pain    Periodic limb movement disorder    Periodic limb movements of sleep    Prediabetes    Pure hypercholesterolemia    Restless legs    Sleep apnea    wears cpap    Vitamin D deficiency     Family History  Problem Relation Age of Onset   Cancer Mother        breast and ovarian   Anxiety disorder Mother    Breast cancer Mother    Ovarian cancer Mother    Depression Father        bi-polar   Hyperlipidemia Father    Heart disease Father  Sudden death Father    Bipolar disorder Father    Sleep apnea Father    Obesity Father    Depression Son    Colon cancer Neg Hx    Colon polyps Neg Hx    Esophageal cancer Neg Hx    Rectal cancer Neg Hx    Stomach cancer Neg Hx     Social History   Socioeconomic History   Marital status: Married    Spouse name: Not on file   Number of children: Not on file   Years of education: Not on file   Highest education level: Not on file  Occupational History   Occupation: retired Pensions consultant  Tobacco Use   Smoking status: Never   Smokeless tobacco: Never  Substance and Sexual Activity   Alcohol use: Yes    Comment: occasionally    Drug use: No   Sexual activity: Not on file  Other Topics Concern   Not on file  Social History Narrative   Not on file   Social Determinants of Health   Financial Resource Strain: Not on file  Food Insecurity: No Food Insecurity (01/25/2022)   [facility] prior to 03/24/2022., [facility] visits prior to 03/24/2022., [facility]   Hunger Vital Sign    Worried About Running Out of Food in the Last Year: Never true    Ran Out of Food in the Last Year: Never true  Transportation Needs: No Transportation Needs (01/25/2022)   Received from [employer], [facility] visits prior to 03/24/2022., [facility]  [facility] visits prior to 03/24/2022., [facility]   PRAPARE - Transportation    Lack of Transportation (Medical): No    Lack of Transportation (Non-Medical): No  Physical Activity: Not on file  Stress: Not on file  Social Connections: Not on file  Intimate Partner Violence: Not on file    Past Medical History, Surgical history, Social history, and Family history were reviewed and updated as appropriate.   Please see review of systems for further details on the patient's review from today.   Objective:   Physical Exam:  BP 138/74   Pulse 76   Physical Exam Constitutional:      General: He is not in acute distress.    Appearance: He is obese.  Musculoskeletal:        General: No deformity.  Neurological:     Mental Status: He is alert and oriented to person, place, and time.     Cranial Nerves: No dysarthria.     Coordination: Coordination normal.  Psychiatric:        Attention and Perception: Attention and perception normal. He does not perceive auditory or visual hallucinations.        Mood and Affect: Mood is anxious and depressed. Affect is not labile, blunt, angry or inappropriate.        Speech: Speech normal.        Behavior: Behavior normal. Behavior is cooperative.        Thought Content: Thought content normal. Thought content is not paranoid or delusional. Thought content does not include homicidal or suicidal ideation. Thought content does not include suicidal plan.        Cognition and Memory: Cognition and memory normal.        Judgment: Judgment normal.     Comments: Insight intact Residual depression and fatigue  More subdued than usual but missed Ritalin today  November 06, 2018: [place] Cog test in office within normal limits MMSE 28/30. Animal fluency 17 . (borderline) Taken as a whole, no indication to pursue neuropsychological testing.  Mini-Mental status exam 28/30 on 10/27/20.  No evidence of dementia.  Lab Review:   Vitamin D level  acceptable at 54.5.   Normal B12 and folate and TSH in last couple of years..  Echocardiogram is stable re: AVR over the last 8 years and not likely the cause of lethargy.  .res Assessment: Plan:    Hildreth was seen today for follow-up, depression, anxiety, add, fatigue, sleeping problem and medication reaction.  Diagnoses and all orders for this visit:  Major depressive disorder, recurrent episode, moderate (HCC)  Mixed obsessional thoughts and acts  Attention deficit hyperactivity disorder (ADHD), predominantly inattentive type  Hypersomnia with sleep apnea -     pregabalin (LYRICA) 100 MG capsule; Take 1 capsule (100 mg total) by mouth at bedtime.  Restless legs syndrome -     pregabalin (LYRICA) 100 MG capsule; Take 1 capsule (100 mg total) by mouth at bedtime.  Low vitamin D level  Mild cognitive impairment  Obstructive sleep apnea  Other chronic pain -     pregabalin (LYRICA) 100 MG capsule; Take 1 capsule (100 mg total) by mouth at bedtime.    30 min face to face time with patient was spent on counseling and coordination of care. We discussed Mr. Chriscoe has a long history of depression and OCD which are partially controlled.  He requires frequent follow-up and his request because of significant residual depression and anxiety and concerns about polypharmacy and he wants regular therapeutic advice on how to further reduce his OCD.  He believes the depression is a consequence of the residual OCD.  He has some compulsive checking and obsessions around the house maintenance.  He wishes to avoid sexual side effects and so we are keeping the SSRI at the lowest possible dose.  He is tried all of the reasonable SSRI options with the exception possibly of sertraline but it is likely to have more sexual dysfunction than what he is taking now.  When travels then tends to have less OCD bc triggered less.    Lexapro to 20 mg daily.  He tried 40 mg daily without extra benefit.   Disc extra cardiac risk at higher dose so will keep it at 20 if needed.  His OCD is persistent with checking lites, stove, etc. but it is not heavily time-consuming and is mildly to moderately distressing.  Overall his level of depression is mild to moderate with poor motivation to do things which bothers his wife.  Supportive therapy in solving this.Marland Kitchen  He is able to find things that he enjoys and he does have interests but they are reduced below normal for him.Marland Kitchen  He improved activity levels generally.  And He feels that Ritalin is helpful for energy, productivity, focus and attention.  Fearful about potential BP effects.  Suggest talk with card about that. He wants to stay at the lower dose Ritalin 30 mg AM to protect sleep. Bc delayed sleep phase. Hold stimulant if ill and not able to benefit.  Discussed potential benefits, risks, and side effects of stimulants with patient to include increased heart rate, palpitations, insomnia, increased anxiety, increased irritability, or decreased appetite.  Instructed patient to contact office if experiencing any significant tolerability issues. Disc risk of increasing the Ritalin further elevating BP and pulse if we increase the Ritalin.  Need to verify  that it's not markedly elevated from taking the Ritalin. Doesn't think it's been consistently elevated. Disc crash risk.  He doesn't need feel it causes crash.  Disc difference between different types of cognitive difficulties such as Alzheimer's disease for which there is no evidence and ADD related inattentiveness which can contribute to some of the cognitive problems that his wife identifies.  He may also have longstanding underlying ADD.  He has used Ritalin but it is inherently short acting and he is only taking it once a day.  I  Extensive discussion of sleep study and he has a copy.  Whys is there virtually no N3 & REM sleep?  Is that affecting daytime alertness and fatigue?  Vs how much is related to mild  OSA and PLMS?  Disc this in detail.  Wrote coreespondence with sleep doc about it.  Options for sleep & fatigue:  Difficult to assess  bc has been [place] of [place] and then sick for 3 weeks.   Focus on reduction in OSA Focus on improving deep stage sleep.  ? Rx low dose mirtazapine or alternatives Focus on leg movements (hx RLS/PLMS) continue Trial as had been suggested Lyrica for FM type sx and may help RLS/PLMS.  Better dreaming on 100 mg HS and too sleepy on 150.  Decrease  to 100 mg HS for sleep and back pain and RLS No ropinirole needed.   Disc SE.  Not having any  Use LED Xanax and try to avoid BZ daytime bc fatigue.  He doesn't use it much daythime.  Using 0..25-0.5 mg at night.  May need less with more Lyrica at [facility] and try to minimize.  Thinks memory problem relate to OCD bc often counting in the in the background at rest which tends to reduce his attention.  Ritalin is being successfully used off label to augment antidepressants for depression and have resulted in improved productivity and attention.  Previous screening of memory was not suggestive of any neuro degenerative process. Mini-Mental status exam 28/30 on 10/27/20.  No evidence of dementia.  He has lost 47 # pounds on Ozempic. Disc use of this for weight loss.  This will help a number of things including joints.   Option sildenafil .  He wishes to defer.  No problem with the reduction in ropinirole. RLS managed.  Still no complaints. Disc also dx PLMS.  Wife denies noticing it lately.  consider switch to Cymbalta.  Defer until next visit to see if energy, motivation is better.  Supportive therapy and problem solving around distinguishing decision from OCD.  [place] getting back to exercise.  Disc naltrexone and wt loss given his lack of sufficient repsonse with Ozempic 2mg  weekly.  Disc dosing.  He's going to discuss with wt loss center.  Started seeing [facility] counseling again.  Follow-up 4 weeks per pt  request  Meredith Staggers MD, DFAPA.  Please see After Visit Summary for patient specific instructions.  Future Appointments  Date Time Provider Department Center  09/13/2022  1:30 PM Cottle, Steva Ready., MD [facility] None  10/16/2022  1:00 PM Cottle, Steva Ready., MD [facility] None  11/15/2022  1:00 PM Cottle, Steva Ready., MD [facility] None  12/18/2022  1:00 PM Cottle, Steva Ready., MD [facility] None  01/21/2023  1:00 PM Cottle, Steva Ready., MD [facility] None     No orders of the defined types were placed in this encounter.      -------------------------------

## 2022-08-27 ENCOUNTER — Telehealth: Payer: Self-pay | Admitting: Psychiatry

## 2022-08-27 ENCOUNTER — Other Ambulatory Visit: Payer: Self-pay

## 2022-08-27 DIAGNOSIS — F900 Attention-deficit hyperactivity disorder, predominantly inattentive type: Secondary | ICD-10-CM

## 2022-08-27 NOTE — Telephone Encounter (Signed)
Pended.

## 2022-08-27 NOTE — Telephone Encounter (Signed)
Patient lvm at 2:56 requesting refill for Methylphenidate 10mg . Ph: [phone] Appt 8/22 Pharmacy [facility] [address] [place]

## 2022-08-28 MED ORDER — METHYLPHENIDATE HCL 10 MG PO TABS
10.0000 mg | ORAL_TABLET | Freq: Three times a day (TID) | ORAL | 0 refills | Status: DC
Start: 2022-08-28 — End: 2022-10-18

## 2022-09-13 ENCOUNTER — Encounter: Payer: Self-pay | Admitting: Psychiatry

## 2022-09-13 ENCOUNTER — Telehealth (INDEPENDENT_AMBULATORY_CARE_PROVIDER_SITE_OTHER): Payer: Medicare Other | Admitting: Psychiatry

## 2022-09-13 DIAGNOSIS — R5383 Other fatigue: Secondary | ICD-10-CM | POA: Diagnosis not present

## 2022-09-13 DIAGNOSIS — G4733 Obstructive sleep apnea (adult) (pediatric): Secondary | ICD-10-CM

## 2022-09-13 DIAGNOSIS — F331 Major depressive disorder, recurrent, moderate: Secondary | ICD-10-CM

## 2022-09-13 DIAGNOSIS — G4710 Hypersomnia, unspecified: Secondary | ICD-10-CM

## 2022-09-13 DIAGNOSIS — F900 Attention-deficit hyperactivity disorder, predominantly inattentive type: Secondary | ICD-10-CM

## 2022-09-13 DIAGNOSIS — G3184 Mild cognitive impairment, so stated: Secondary | ICD-10-CM

## 2022-09-13 DIAGNOSIS — R7989 Other specified abnormal findings of blood chemistry: Secondary | ICD-10-CM

## 2022-09-13 DIAGNOSIS — R051 Acute cough: Secondary | ICD-10-CM | POA: Diagnosis not present

## 2022-09-13 DIAGNOSIS — G4730 Sleep apnea, unspecified: Secondary | ICD-10-CM

## 2022-09-13 DIAGNOSIS — F422 Mixed obsessional thoughts and acts: Secondary | ICD-10-CM | POA: Diagnosis not present

## 2022-09-13 DIAGNOSIS — U071 COVID-19: Secondary | ICD-10-CM | POA: Diagnosis not present

## 2022-09-13 DIAGNOSIS — G2581 Restless legs syndrome: Secondary | ICD-10-CM

## 2022-09-13 NOTE — Progress Notes (Signed)
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 Lyrica 150 tired  pramipexole,  ropinirole Adderall XR & IR sexual SE, Ritalin 30, Concerta 72 mg AM NR modafinil and Nuvigil,   History [employer] OCD  Review of Systems:  Review of Systems  Constitutional:  Positive for fatigue. Negative for fever.  Cardiovascular:  Negative for palpitations.  Genitourinary:        ED  Musculoskeletal:  Positive for arthralgias and myalgias.  Neurological:  Negative for tremors.  Psychiatric/Behavioral:  Positive for dysphoric mood and sleep disturbance. Negative for agitation, behavioral problems, confusion, decreased concentration, hallucinations, self-injury and suicidal ideas. The patient is nervous/anxious. The patient is not hyperactive.     Medications: I have reviewed the patient's current medications.  Current Outpatient Medications  Medication Sig Dispense Refill   ALPRAZolam (XANAX) 0.25 MG tablet TAKE 1 TABLET (0.25 MG TOTAL) BY MOUTH 2 (TWO) TIMES DAILY AS NEEDED FOR ANXIETY OR SLEEP. 30 tablet 1   aspirin  81 MG tablet Take 81 mg by mouth daily.     Cholecalciferol (VITAMIN D-3) 5000 units TABS Take 5,000 Units by mouth daily.      ergocalciferol (VITAMIN D2) 1.25 MG (50000 UT) capsule Take 50,000 Units by mouth once a week.     escitalopram (LEXAPRO) 20 MG tablet TAKE 1 TABLET BY MOUTH EVERY DAY 90 tablet 0   methylphenidate (RITALIN) 10 MG tablet Take 1 tablet (10 mg total) by mouth 3 (three) times daily with meals. 90 tablet 0   naltrexone (DEPADE) 50 MG tablet TAKE 1/2 TABLET BY MOUTH DAILY 45 tablet 1   pregabalin (LYRICA) 100 MG capsule Take 1 capsule (100 mg total) by mouth at bedtime. 90 capsule 0   risperiDONE (RISPERDAL) 0.5 MG tablet Take 0.5 mg by mouth as needed. PRN     rOPINIRole (REQUIP) 1 MG tablet TAKE 3 TABLETS (3 MG TOTAL) BY MOUTH AT BEDTIME. 270 tablet 0   Semaglutide,0.25 or 0.5MG /DOS, (OZEMPIC, 0.25 OR 0.5 MG/DOSE,) 2 MG/1.5ML SOPN Inject into the  skin.     simvastatin (ZOCOR) 20 MG tablet Take 20 mg by mouth at bedtime.     No current facility-administered medications for this visit.    Medication Side Effects: None sexual SE are better not  All gone.  Allergies:  Allergies  Allergen Reactions   E.E.S. [Erythromycin] Hives   Macrolides And Ketolides Other (See Comments)    EES    Rosuvastatin     Other reaction(s): cramps    Past Medical History:  Diagnosis Date   Allergy    Anemia    iron- pt denies    Anxiety    Aortic cusp regurgitation    Carotid artery occlusion    Constipation    Coronary artery stenosis    Hyperlipidemia    Lactose intolerance    Major depression, recurrent, chronic (HCC)    Obesity    OCD (obsessive compulsive disorder)    OSA (obstructive sleep apnea)    Other chronic pain    Periodic limb movement disorder    Periodic limb movements of sleep    Prediabetes    Pure hypercholesterolemia    Restless legs    Sleep apnea    wears cpap    Vitamin D deficiency     Family History  Problem Relation Age of Onset   Cancer  Mother        breast and ovarian   Anxiety disorder Mother    Breast cancer Mother    Ovarian cancer Mother    Depression Father        bi-polar   Hyperlipidemia Father    Heart disease Father    Sudden death Father    Bipolar disorder Father    Sleep apnea Father    Obesity Father    Depression Son    Colon cancer Neg Hx    Colon polyps Neg Hx    Esophageal cancer Neg Hx    Rectal cancer Neg Hx    Stomach cancer Neg Hx     Social History   Socioeconomic History   Marital status: Married    Spouse name: Not on file   Number of children: Not on file   Years of education: Not on file   Highest education level: Not on file  Occupational History   Occupation: retired Pensions consultant  Tobacco Use   Smoking status: Never   Smokeless tobacco: Never  Substance and Sexual Activity   Alcohol use: Yes    Comment: occasionally    Drug use: No   Sexual activity: Not on file  Other Topics Concern   Not on file  Social History Narrative   Not on file   Social Determinants of Health   Financial Resource Strain: Not on file  Food Insecurity: No Food Insecurity (01/25/2022)   [facility]22., [facility] prior to 03/24/2022., [facility]   Hunger Vital Sign    Worried About Running Out of Food in the Last Year: Never true    Ran Out of Food in the Last Year: Never true  Transportation Needs: No Transportation Needs (01/25/2022)   Received from [facility] [employer], [facility] prior to 03/24/2022., [facility] visits prior to 03/24/2022., [facility]   PRAPARE - Transportation    Lack of Transportation (Medical): No    Lack of Transportation (Non-Medical): No  Physical Activity: Not on file  Stress: Not on  file  Social Connections: Not on file  Intimate Partner Violence: Not on file    Past Medical History, Surgical history, Social history,  and Family history were reviewed and updated as appropriate.   Please see review of systems for further details on the patient's review from today.   Objective:   Physical Exam:  There were no vitals taken for this visit.  Physical Exam Constitutional:      General: He is not in acute distress.    Appearance: He is obese.  Musculoskeletal:        General: No deformity.  Neurological:     Mental Status: He is alert and oriented to person, place, and time.     Cranial Nerves: No dysarthria.     Coordination: Coordination normal.  Psychiatric:        Attention and Perception: Attention and perception normal. He does not perceive auditory or visual hallucinations.        Mood and Affect: Mood is anxious and depressed. Affect is not labile, blunt, angry or inappropriate.        Speech: Speech normal.        Behavior: Behavior normal. Behavior is cooperative.        Thought Content: Thought content normal. Thought content is not paranoid or delusional. Thought content does not include homicidal or suicidal ideation. Thought content does not include suicidal plan.        Cognition and Memory: Cognition and memory normal.        Judgment: Judgment normal.     Comments: Insight intact Residual depression and fatigue  More subdued than usual but missed Ritalin today   November 06, 2018: [place] Cog test in office within normal limits MMSE 28/30. Animal fluency 17 . (borderline) Taken as a whole, no indication to pursue neuropsychological testing.  Mini-Mental status exam 28/30 on 10/27/20.  No evidence of dementia.  Lab Review:   Vitamin D level acceptable at 54.5.   Normal B12 and folate and TSH in last couple of years..  Echocardiogram is stable re: AVR over the last 8 years and not likely the cause of lethargy.  .res Assessment: Plan:    Otto was seen today for follow-up, depression, anxiety and add.  Diagnoses and all orders for this visit:  Major depressive disorder,  recurrent episode, moderate (HCC)  Mixed obsessional thoughts and acts  Attention deficit hyperactivity disorder (ADHD), predominantly inattentive type  Hypersomnia with sleep apnea  Restless legs syndrome  Low vitamin D level  Mild cognitive impairment  Obstructive sleep apnea   30 min face to face time with patient was spent on counseling and coordination of care. We discussed Mr. Kintz has a long history of depression and OCD which are partially controlled.  He requires frequent follow-up and his request because of significant residual depression and anxiety and concerns about polypharmacy and he wants regular therapeutic advice on how to further reduce his OCD.  He believes the depression is a consequence of the residual OCD.  He has some compulsive checking and obsessions around the house maintenance.  He wishes to avoid sexual side effects and so we are keeping the SSRI at the lowest possible dose.  He is tried all of the reasonable SSRI options with the exception possibly of sertraline but it is likely to have more sexual dysfunction than what he is taking now.  When travels then tends to have less OCD bc triggered less.    Lexapro to 20 mg daily.  He tried 40 mg daily without extra benefit.  Disc extra cardiac risk at higher dose so will keep it at 20 if needed.  His OCD is persistent with checking lites, stove, etc. but it is not heavily time-consuming and is mildly to moderately distressing.  Overall his level of depression is mild to moderate with poor motivation to do things which bothers his wife.  Supportive therapy in solving this.Marland Kitchen  He is able to find things that he enjoys and he does have interests but they are reduced below normal for him.Marland Kitchen  He improved activity levels generally.  And He feels that Ritalin is helpful for energy, productivity, focus and attention.  Fearful about potential BP effects.  Suggest talk with card about that. He wants to stay at the lower dose  Ritalin 30 mg AM to protect sleep. Bc delayed sleep phase. Hold stimulant if ill and not able to benefit.  Discussed potential benefits, risks, and side effects of stimulants with patient to include increased heart rate, palpitations, insomnia, increased anxiety, increased irritability, or decreased appetite.  Instructed patient to contact office if experiencing any significant tolerability issues. Disc risk of increasing the Ritalin further elevating BP and pulse if we increase the Ritalin.  Need to verify that it's not markedly elevated from taking the Ritalin. Doesn't think it's been consistently elevated. Disc crash risk.  He doesn't need feel it causes crash.  Disc difference between different types of cognitive difficulties such as Alzheimer's disease for which there is no evidence and ADD related inattentiveness which can contribute to some of the cognitive problems that his wife identifies.  He may also have longstanding underlying ADD.  He has used Ritalin but it is inherently short acting and he is only taking it once a day.    Extensive discussion of sleep study and he has a copy.  Whys is there virtually no N3 & REM sleep?  Is that affecting daytime alertness and fatigue?  Vs how much is related to mild OSA and PLMS?  Disc this in detail.  Wrote coreespondence with sleep doc about it.  Options for sleep & fatigue:  Difficult to assess  bc has been [place] of [place] and then sick for 3 weeks.   Focus on reduction in OSA Focus on improving deep stage sleep.  ? Rx low dose mirtazapine or alternatives Focus on leg movements (hx RLS/PLMS) continue Trial as had been suggested Lyrica for FM type sx and may help RLS/PLMS.  Better dreaming on 100 mg HS and too sleepy on 150.  Decrease  to 100 mg HS for sleep and back pain and RLS No ropinirole needed.   Disc SE.  Not having any.   Use LED Xanax and try to avoid BZ daytime bc fatigue.  He doesn't use it much daythime.  Using 0..25-0.5 mg at night.   May need less with more Lyrica at [facility] and try to minimize.  No hangover. We discussed the short-term risks associated with benzodiazepines including sedation and increased fall risk among others.  Discussed long-term side effect risk including dependence, potential withdrawal symptoms, and the potential eventual dose-related risk of dementia.  But recent studies from 2020 dispute this association between benzodiazepines and dementia risk. Newer studies in 2020 do not support an association with dementia.  Thinks memory problem relate to OCD bc often counting in the in the background at rest which tends to reduce his attention.  Ritalin is being successfully used off label to augment antidepressants for  depression and have resulted in improved productivity and attention.  Previous screening of memory was not suggestive of any neuro degenerative process. Mini-Mental status exam 28/30 on 10/27/20.  No evidence of dementia.  He has lost 47 # pounds on Ozempic. Disc use of this for weight loss.  This will help a number of things including joints.   Option sildenafil .  He wishes to defer.  No problem with the reduction in ropinirole. RLS managed.  Still no complaints. Disc also dx PLMS.  Wife denies noticing it lately.  consider switch to Cymbalta.  Defer to see if energy, motivation is better.  Consider clomipramine.    Supportive therapy and problem solving around distinguishing decision from OCD.  [place] getting back to exercise.  Disc naltrexone and wt loss given his lack of sufficient repsonse with Ozempic 2mg  weekly.  Disc dosing.  He is finding it helpful.   Started seeing [employer] counseling again.  Follow-up 4 weeks per pt request  Meredith Staggers MD, DFAPA.  Please see After Visit Summary for patient specific instructions.  Future Appointments  Date Time Provider Department Center  10/16/2022  1:00 PM Cottle, Steva Ready., MD [facility] None  11/15/2022  1:00 PM Cottle, Steva Ready., MD [facility]  None  12/18/2022  1:00 PM Cottle, Steva Ready., MD [facility] None  01/21/2023  1:00 PM Cottle, Steva Ready., MD [facility] None     No orders of the defined types were placed in this encounter.      -------------------------------

## 2022-09-18 ENCOUNTER — Other Ambulatory Visit: Payer: Self-pay | Admitting: Psychiatry

## 2022-09-18 DIAGNOSIS — G2581 Restless legs syndrome: Secondary | ICD-10-CM

## 2022-09-18 DIAGNOSIS — G4710 Hypersomnia, unspecified: Secondary | ICD-10-CM

## 2022-09-18 DIAGNOSIS — F422 Mixed obsessional thoughts and acts: Secondary | ICD-10-CM

## 2022-09-26 DIAGNOSIS — H6123 Impacted cerumen, bilateral: Secondary | ICD-10-CM | POA: Diagnosis not present

## 2022-09-26 DIAGNOSIS — T162XXA Foreign body in left ear, initial encounter: Secondary | ICD-10-CM | POA: Diagnosis not present

## 2022-10-03 DIAGNOSIS — F411 Generalized anxiety disorder: Secondary | ICD-10-CM | POA: Diagnosis not present

## 2022-10-03 DIAGNOSIS — F5081 Binge eating disorder: Secondary | ICD-10-CM | POA: Diagnosis not present

## 2022-10-03 DIAGNOSIS — R262 Difficulty in walking, not elsewhere classified: Secondary | ICD-10-CM | POA: Diagnosis not present

## 2022-10-10 DIAGNOSIS — I351 Nonrheumatic aortic (valve) insufficiency: Secondary | ICD-10-CM | POA: Diagnosis not present

## 2022-10-11 DIAGNOSIS — R7303 Prediabetes: Secondary | ICD-10-CM | POA: Diagnosis not present

## 2022-10-11 DIAGNOSIS — F331 Major depressive disorder, recurrent, moderate: Secondary | ICD-10-CM | POA: Diagnosis not present

## 2022-10-11 DIAGNOSIS — Z79899 Other long term (current) drug therapy: Secondary | ICD-10-CM | POA: Diagnosis not present

## 2022-10-11 DIAGNOSIS — Z23 Encounter for immunization: Secondary | ICD-10-CM | POA: Diagnosis not present

## 2022-10-11 DIAGNOSIS — E7800 Pure hypercholesterolemia, unspecified: Secondary | ICD-10-CM | POA: Diagnosis not present

## 2022-10-16 ENCOUNTER — Encounter: Payer: Self-pay | Admitting: Psychiatry

## 2022-10-16 ENCOUNTER — Ambulatory Visit: Payer: Medicare Other | Admitting: Psychiatry

## 2022-10-16 DIAGNOSIS — G2581 Restless legs syndrome: Secondary | ICD-10-CM

## 2022-10-16 DIAGNOSIS — F900 Attention-deficit hyperactivity disorder, predominantly inattentive type: Secondary | ICD-10-CM

## 2022-10-16 DIAGNOSIS — F331 Major depressive disorder, recurrent, moderate: Secondary | ICD-10-CM

## 2022-10-16 DIAGNOSIS — F422 Mixed obsessional thoughts and acts: Secondary | ICD-10-CM

## 2022-10-16 DIAGNOSIS — G4730 Sleep apnea, unspecified: Secondary | ICD-10-CM

## 2022-10-16 DIAGNOSIS — G4710 Hypersomnia, unspecified: Secondary | ICD-10-CM

## 2022-10-16 DIAGNOSIS — G4733 Obstructive sleep apnea (adult) (pediatric): Secondary | ICD-10-CM

## 2022-10-16 DIAGNOSIS — G3184 Mild cognitive impairment, so stated: Secondary | ICD-10-CM

## 2022-10-16 DIAGNOSIS — R7989 Other specified abnormal findings of blood chemistry: Secondary | ICD-10-CM

## 2022-10-16 MED ORDER — DULOXETINE HCL 30 MG PO CPEP
ORAL_CAPSULE | ORAL | 1 refills | Status: DC
Start: 2022-10-16 — End: 2022-11-07

## 2022-10-16 NOTE — Patient Instructions (Signed)
Start duloxetine 30 mg 1 daily with escitalopram 20 mg daily for 1 week,  then reduce escitalopram to 1/2 daily and increase duloxetine 2 of the 30 mg capsules daily for a week,  Then stop escitalopram and increase duloxetine to 3 of the 30 mg capsules daily.

## 2022-10-16 NOTE — Progress Notes (Signed)
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 Negative for agitation, behavioral problems, confusion, decreased concentration, hallucinations, self-injury and suicidal ideas. The patient is nervous/anxious. The patient is not hyperactive.     Medications: I have reviewed the patient's current medications.  Current Outpatient Medications  Medication Sig Dispense Refill   ALPRAZolam (XANAX) 0.25 MG tablet TAKE 1 TABLET (0.25 MG TOTAL) BY MOUTH 2 (TWO) TIMES DAILY AS NEEDED FOR ANXIETY OR SLEEP. 30 tablet 1   aspirin 81 MG tablet Take 81 mg by mouth daily.     Cholecalciferol (VITAMIN D-3) 5000 units TABS Take 5,000 Units by mouth daily.      DULoxetine (CYMBALTA) 30 MG capsule 1 capsule daily for 1 week, then 2 capsules daily for 1 week, then 3 capsules daily 90 capsule 1   ergocalciferol (VITAMIN D2) 1.25 MG (50000 UT) capsule Take 50,000 Units by mouth once a week.     escitalopram (LEXAPRO) 20 MG tablet TAKE 1 TABLET BY MOUTH EVERY DAY 90 tablet 0   methylphenidate (RITALIN) 10  MG tablet Take 1 tablet (10 mg total) by mouth 3 (three) times daily with meals. 90 tablet 0   naltrexone (DEPADE) 50 MG tablet TAKE 1/2 TABLET BY MOUTH DAILY 45 tablet 1   pregabalin (LYRICA) 100 MG capsule Take 1 capsule (100 mg total) by mouth at bedtime. 90 capsule 0   pregabalin (LYRICA) 50 MG capsule TAKE 1 CAPSULE BY MOUTH TWICE A DAY 60 capsule 0   risperiDONE (RISPERDAL) 0.5 MG tablet Take 0.5 mg by mouth as needed. PRN     rOPINIRole (REQUIP) 1 MG tablet TAKE 3 TABLETS (3 MG TOTAL) BY MOUTH AT BEDTIME. 270 tablet 0   Semaglutide,0.25 or 0.5MG /DOS, (OZEMPIC, 0.25 OR 0.5 MG/DOSE,) 2 MG/1.5ML SOPN Inject into the skin.     simvastatin (ZOCOR) 20 MG tablet Take 20 mg by mouth at bedtime.  No current facility-administered medications for this visit.    Medication Side Effects: None sexual SE are better not  All gone.  Allergies:  Allergies  Allergen Reactions   E.E.S. [Erythromycin] Hives   Macrolides And Ketolides Other (See Comments)    EES    Rosuvastatin     Other reaction(s): cramps    Past Medical History:  Diagnosis Date   Allergy    Anemia    iron- pt denies    Anxiety    Aortic cusp regurgitation    Carotid artery occlusion    Constipation    Coronary artery stenosis    Hyperlipidemia    Lactose intolerance    Major depression, recurrent, chronic (HCC)    Obesity    OCD (obsessive compulsive disorder)    OSA (obstructive sleep apnea)    Other chronic pain    Periodic limb movement disorder    Periodic limb movements of sleep    Prediabetes    Pure hypercholesterolemia    Restless legs    Sleep apnea    wears cpap    Vitamin D deficiency     Family History  Problem Relation Age of Onset   Cancer Mother        breast and ovarian   Anxiety disorder Mother    Breast cancer Mother    Ovarian cancer Mother    Depression Father        bi-polar   Hyperlipidemia Father    Heart disease Father    Sudden death Father    Bipolar disorder Father     Sleep apnea Father    Obesity Father    Depression Son    Colon cancer Neg Hx    Colon polyps Neg Hx    Esophageal cancer Neg Hx    Rectal cancer Neg Hx    Stomach cancer Neg Hx     Social History   Socioeconomic History   Marital status: [name]    Spouse name: Not on file   Number of children: Not on file   Years of education: Not on file   Highest education level: Not on file  Occupational History   Occupation: retired Pensions consultant  Tobacco Use   Smoking status: Never   Smokeless tobacco: Never  Substance and Sexual Activity   Alcohol use: Yes    Comment: occasionally    Drug use: No   Sexual activity: Not on file  Other Topics Concern   Not on file  Social History Narrative   Not on file   Social Determinants of Health   Financial Resource Strain: Not on file  Food Insecurity: No Food Insecurity (01/25/2022)   [facility] from [facility] prior to [facility]24[facility]2022[facility], [facility] visits prior to 03/24/2022., [facility]   Hunger Vital Sign    Worried About Running Out of Food in the Last Year: Never true    Ran Out of Food in the Last Year: Never true  Transportation Needs: No Transportation Needs (01/25/2022)   Received from [facility] [employer], [facility] visits prior to 03/24/2022., [facility] visits prior to 03/24/2022., [facility]   PRAPARE - Transportation    Lack of Transportation (Medical): No    Lack of Transportation (Non-Medical): No  Physical Activity: Not on file  Stress: Not on file  Social Connections: Not on file  Intimate Partner Violence: Not on file    Past Medical History, Surgical  history, Social history, and Family history were reviewed and updated as appropriate.   Please see review of systems for further details on the patient's review from today.   Objective:   Physical Exam:  There were no vitals taken for this visit.  Physical  Exam Constitutional:      General: He is not in acute distress.    Appearance: He is obese.  Musculoskeletal:        General: No deformity.  Neurological:     Mental Status: He is alert and oriented to person, place, and time.     Cranial Nerves: No dysarthria.     Coordination: Coordination normal.  Psychiatric:        Attention and Perception: Attention and perception normal. He does not perceive auditory or visual hallucinations.        Mood and Affect: Mood is anxious and depressed. Affect is not labile, blunt, angry or inappropriate.        Speech: Speech normal.        Behavior: Behavior normal. Behavior is cooperative.        Thought Content: Thought content normal. Thought content is not paranoid or delusional. Thought content does not include homicidal or suicidal ideation. Thought content does not include suicidal plan.        Cognition and Memory: Cognition and memory normal.        Judgment: Judgment normal.     Comments: Insight intact Residual depression and fatigue  More subdued than usual  Wife reports he's more anxious   November 06, 2018: [place] Cog test in office within normal limits MMSE 28/30. Animal fluency 17 . (borderline) Taken as a whole, no indication to pursue neuropsychological testing.  Mini-Mental status exam 28/30 on 10/27/20.  No evidence of dementia.  Lab Review:   Vitamin D level acceptable at 54.5.   Normal B12 and folate and TSH in last couple of years..  Echocardiogram is stable re: AVR over the last 8 years and not likely the cause of lethargy.  .res Assessment: Plan:    Joakim was seen today for follow-up, depression and anxiety.  Diagnoses and all orders for this visit:  Mixed obsessional thoughts and acts -     DULoxetine (CYMBALTA) 30 MG capsule; 1 capsule daily for 1 week, then 2 capsules daily for 1 week, then 3 capsules daily  Attention deficit hyperactivity disorder (ADHD), predominantly inattentive type  Major  depressive disorder, recurrent episode, moderate (HCC) -     DULoxetine (CYMBALTA) 30 MG capsule; 1 capsule daily for 1 week, then 2 capsules daily for 1 week, then 3 capsules daily  Hypersomnia with sleep apnea  Restless legs syndrome  Low vitamin D level  Mild cognitive impairment  Obstructive sleep apnea    30 min face to face time with patient was spent on counseling and coordination of care. We discussed Mr. Piekarski has a long history of depression and OCD which are partially controlled.  He requires frequent follow-up and his request because of significant residual depression and anxiety and concerns about polypharmacy and he wants regular therapeutic advice on how to further reduce his OCD.  He believes the depression is a consequence of the residual OCD.  He has some compulsive checking and obsessions around the house maintenance.  He wishes to avoid sexual side effects and so we are keeping the SSRI at the lowest possible dose.  He is tried all of the reasonable SSRI options with the exception possibly of sertraline but  it is likely to have more sexual dysfunction than what he is taking now.  When travels then tends to have less OCD bc triggered less.    Lexapro to 20 mg daily.  He tried 40 mg daily without extra benefit.  Disc extra cardiac risk at higher dose so will keep it at 20 if needed.  His OCD is persistent with checking lites, stove, etc. but it is not heavily time-consuming and is mildly to moderately distressing.  Overall his level of depression is mild to moderate with poor motivation to do things which bothers his wife.  Supportive therapy in solving this.Marland Kitchen  He is able to find things that he enjoys and he does have interests but they are reduced below normal for him.Marland Kitchen  He improved activity levels generally.  And He feels that Ritalin is helpful for energy, productivity, focus and attention.  Fearful about potential BP effects.  Suggest talk with card about that. He  wants to stay at the lower dose Ritalin 30 mg AM to protect sleep. Bc delayed sleep phase. Hold stimulant if ill and not able to benefit.  Discussed potential benefits, risks, and side effects of stimulants with patient to include increased heart rate, palpitations, insomnia, increased anxiety, increased irritability, or decreased appetite.  Instructed patient to contact office if experiencing any significant tolerability issues. Disc risk of increasing the Ritalin further elevating BP and pulse if we increase the Ritalin.  Need to verify that it's not markedly elevated from taking the Ritalin. Doesn't think it's been consistently elevated. Disc crash risk.  He doesn't need feel it causes crash.  Disc difference between different types of cognitive difficulties such as Alzheimer's disease for which there is no evidence and ADD related inattentiveness which can contribute to some of the cognitive problems that his wife identifies.  He may also have longstanding underlying ADD.  He has used Ritalin but it is inherently short acting and he is only taking it once a day.    Extensive discussion of sleep study and he has a copy.  Whys is there virtually no N3 & REM sleep?  Is that affecting daytime alertness and fatigue?  Vs how much is related to mild OSA and PLMS?  Disc this in detail.  Wrote coreespondence with sleep doc about it.  Options for sleep & fatigue:  Difficult to assess  bc has been out of country and then sick for 3 weeks.   Focus on reduction in OSA Focus on improving deep stage sleep.  ? Rx low dose mirtazapine or alternatives Focus on leg movements (hx RLS/PLMS) continue Trial as had been suggested Lyrica for FM type sx and may help RLS/PLMS.  Better dreaming on 100 mg HS and too sleepy on 150.  Decrease  to 100 mg HS for sleep and back pain and RLS No ropinirole needed.   Disc SE.  Not having any.   Use LED Xanax and try to avoid BZ daytime bc fatigue.  He doesn't use it much  daythime.  Using 0..25-0.5 mg at night.  May need less with more Lyrica at [facility] and try to minimize.  No hangover. We discussed the short-term risks associated with benzodiazepines including sedation and increased fall risk among others.  Discussed long-term side effect risk including dependence, potential withdrawal symptoms, and the potential eventual dose-related risk of dementia.  But recent studies from 2020 dispute this association between benzodiazepines and dementia risk. Newer studies in 2020 do not support an association with dementia.  Thinks memory problem relate to OCD bc often counting in the in the background at rest which tends to reduce his attention.  Ritalin is being successfully used off label to augment antidepressants for depression and have resulted in improved productivity and attention.  Previous screening of memory was not suggestive of any neuro degenerative process. Mini-Mental status exam 28/30 on 10/27/20.  No evidence of dementia.  He has lost 47 # pounds on Ozempic. Disc use of this for weight loss.  This will help a number of things including joints.   Option sildenafil .  He wishes to defer.  No problem with the reduction in ropinirole. RLS managed.  Still no complaints. Disc also dx PLMS.  Wife denies noticing it lately.  consider switch to Cymbalta.  Defer to see if energy, motivation is better.  Consider clomipramine.   Yes to see if energy is better and also anxiety and side benefit for hip pain: Start duloxetine 30 mg 1 daily with escitalopram 20 mg daily for 1 week,  then reduce escitalopram to 1/2 daily and increase duloxetine 2 of the 30 mg capsules daily for a week,  Then stop escitalopram and increase duloxetine to 3 of the 30 mg capsules daily.    Supportive therapy and problem solving around distinguishing decision from OCD.  [place] getting back to exercise.  Disc naltrexone and wt loss given his lack of sufficient repsonse with Ozempic 2mg  weekly.  Disc  dosing.  He is finding it helpful.   Started seeing [facility] counseling again.  Follow-up 4 weeks per pt request  Meredith Staggers MD, DFAPA.  Please see After Visit Summary for patient specific instructions.  Future Appointments  Date Time Provider Department Center  11/15/2022  1:00 PM Cottle, Steva Ready., MD [facility] None  12/18/2022  1:00 PM Cottle, Steva Ready., MD [facility] None  01/21/2023  1:00 PM Cottle, Steva Ready., MD [facility] None     No orders of the defined types were placed in this encounter.      -------------------------------

## 2022-10-18 ENCOUNTER — Telehealth: Payer: Self-pay | Admitting: Psychiatry

## 2022-10-18 ENCOUNTER — Other Ambulatory Visit: Payer: Self-pay

## 2022-10-18 DIAGNOSIS — F900 Attention-deficit hyperactivity disorder, predominantly inattentive type: Secondary | ICD-10-CM

## 2022-10-18 MED ORDER — METHYLPHENIDATE HCL 10 MG PO TABS
10.0000 mg | ORAL_TABLET | Freq: Three times a day (TID) | ORAL | 0 refills | Status: AC
Start: 2022-10-18 — End: ?

## 2022-10-18 NOTE — Telephone Encounter (Signed)
LF [id number]

## 2022-10-18 NOTE — Telephone Encounter (Signed)
Pt called in to request a RF on Methylphenidate RX. Please send to [facility] on [address]. (Says he called earlier today but there is no note)

## 2022-10-18 NOTE — Telephone Encounter (Signed)
Pended.

## 2022-10-18 NOTE — Telephone Encounter (Signed)
Please verify last RF on methylphenidate.

## 2022-10-19 ENCOUNTER — Telehealth: Payer: Self-pay | Admitting: Psychiatry

## 2022-10-19 ENCOUNTER — Other Ambulatory Visit: Payer: Self-pay

## 2022-10-19 DIAGNOSIS — F900 Attention-deficit hyperactivity disorder, predominantly inattentive type: Secondary | ICD-10-CM

## 2022-10-19 DIAGNOSIS — G8929 Other chronic pain: Secondary | ICD-10-CM

## 2022-10-19 DIAGNOSIS — G2581 Restless legs syndrome: Secondary | ICD-10-CM

## 2022-10-19 DIAGNOSIS — G4710 Hypersomnia, unspecified: Secondary | ICD-10-CM

## 2022-10-19 MED ORDER — METHYLPHENIDATE HCL 10 MG PO TABS
10.0000 mg | ORAL_TABLET | Freq: Three times a day (TID) | ORAL | 0 refills | Status: DC
Start: 2022-10-19 — End: 2022-12-18

## 2022-10-19 MED ORDER — PREGABALIN 100 MG PO CAPS
100.0000 mg | ORAL_CAPSULE | Freq: Every evening | ORAL | 0 refills | Status: DC
Start: 2022-10-19 — End: 2023-01-21

## 2022-10-19 NOTE — Telephone Encounter (Signed)
Patient called back states that current pharmacy doesn't have power and needs prescription sent to [facility] [address] [place], [place]

## 2022-10-19 NOTE — Telephone Encounter (Signed)
Pended.

## 2022-10-19 NOTE — Telephone Encounter (Signed)
Pt  lvm that he needs his ritalin and his lyrica [facility] on [address]. He needs this transferred today because he is leaving to go out of town tomorrow

## 2022-11-01 DIAGNOSIS — I779 Disorder of arteries and arterioles, unspecified: Secondary | ICD-10-CM | POA: Diagnosis not present

## 2022-11-01 DIAGNOSIS — E7800 Pure hypercholesterolemia, unspecified: Secondary | ICD-10-CM | POA: Diagnosis not present

## 2022-11-01 DIAGNOSIS — F50819 Binge eating disorder, unspecified: Secondary | ICD-10-CM | POA: Diagnosis not present

## 2022-11-01 DIAGNOSIS — R7303 Prediabetes: Secondary | ICD-10-CM | POA: Diagnosis not present

## 2022-11-01 DIAGNOSIS — Z6835 Body mass index (BMI) 35.0-35.9, adult: Secondary | ICD-10-CM | POA: Diagnosis not present

## 2022-11-07 ENCOUNTER — Other Ambulatory Visit: Payer: Self-pay | Admitting: Psychiatry

## 2022-11-07 DIAGNOSIS — F422 Mixed obsessional thoughts and acts: Secondary | ICD-10-CM

## 2022-11-07 DIAGNOSIS — F331 Major depressive disorder, recurrent, moderate: Secondary | ICD-10-CM

## 2022-11-07 NOTE — Telephone Encounter (Signed)
*  confirmed with pt he is taking 3 daily.

## 2022-11-07 NOTE — Telephone Encounter (Signed)
LF 09/24; LV 09/24; NV 10/24- updated sig, and put in dx code. Did not send rx dt upcoming appt and pt just starting medication.

## 2022-11-08 DIAGNOSIS — I358 Other nonrheumatic aortic valve disorders: Secondary | ICD-10-CM | POA: Diagnosis not present

## 2022-11-08 DIAGNOSIS — I6523 Occlusion and stenosis of bilateral carotid arteries: Secondary | ICD-10-CM | POA: Diagnosis not present

## 2022-11-08 DIAGNOSIS — E785 Hyperlipidemia, unspecified: Secondary | ICD-10-CM | POA: Diagnosis not present

## 2022-11-08 DIAGNOSIS — I10 Essential (primary) hypertension: Secondary | ICD-10-CM | POA: Diagnosis not present

## 2022-11-08 DIAGNOSIS — Z7982 Long term (current) use of aspirin: Secondary | ICD-10-CM | POA: Diagnosis not present

## 2022-11-13 DIAGNOSIS — I351 Nonrheumatic aortic (valve) insufficiency: Secondary | ICD-10-CM | POA: Diagnosis not present

## 2022-11-13 DIAGNOSIS — Q2381 Bicuspid aortic valve: Secondary | ICD-10-CM | POA: Diagnosis not present

## 2022-11-13 DIAGNOSIS — I77810 Thoracic aortic ectasia: Secondary | ICD-10-CM | POA: Diagnosis not present

## 2022-11-15 ENCOUNTER — Encounter: Payer: Self-pay | Admitting: Psychiatry

## 2022-11-15 ENCOUNTER — Ambulatory Visit: Payer: Medicare Other | Admitting: Psychiatry

## 2022-11-15 DIAGNOSIS — F900 Attention-deficit hyperactivity disorder, predominantly inattentive type: Secondary | ICD-10-CM

## 2022-11-15 DIAGNOSIS — R7989 Other specified abnormal findings of blood chemistry: Secondary | ICD-10-CM

## 2022-11-15 DIAGNOSIS — G4733 Obstructive sleep apnea (adult) (pediatric): Secondary | ICD-10-CM

## 2022-11-15 DIAGNOSIS — G4730 Sleep apnea, unspecified: Secondary | ICD-10-CM

## 2022-11-15 DIAGNOSIS — G4710 Hypersomnia, unspecified: Secondary | ICD-10-CM

## 2022-11-15 DIAGNOSIS — F331 Major depressive disorder, recurrent, moderate: Secondary | ICD-10-CM | POA: Diagnosis not present

## 2022-11-15 DIAGNOSIS — G3184 Mild cognitive impairment, so stated: Secondary | ICD-10-CM

## 2022-11-15 DIAGNOSIS — F422 Mixed obsessional thoughts and acts: Secondary | ICD-10-CM | POA: Diagnosis not present

## 2022-11-15 DIAGNOSIS — G2581 Restless legs syndrome: Secondary | ICD-10-CM

## 2022-11-15 NOTE — Progress Notes (Signed)
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   Increase Lexapro back to 20 mg January 2020. & 10/2020 bupropion,  Auvelity NR  Abilify 10 fatigue,  Cerefolin NAC, and   Naltrexone sexual SE Lyrica 150 tired  pramipexole,  ropinirole Adderall XR & IR sexual SE, Ritalin 30, Concerta 72 mg AM NR modafinil and Nuvigil,   History [employer] OCD  Review of Systems:  Review of Systems  Constitutional:  Positive for fatigue. Negative for fever.  Cardiovascular:  Negative for palpitations.  Genitourinary:        ED  Musculoskeletal:  Positive for arthralgias and myalgias.  Neurological:  Negative for tremors.  Psychiatric/Behavioral:  Positive for dysphoric mood and sleep disturbance. Negative for agitation, behavioral problems, confusion, decreased concentration, hallucinations, self-injury and suicidal ideas. The patient is nervous/anxious. The patient is not hyperactive.     Medications: I have reviewed the patient's current medications.  Current Outpatient Medications   Medication Sig Dispense Refill   ALPRAZolam (XANAX) 0.25 MG tablet TAKE 1 TABLET (0.25 MG TOTAL) BY MOUTH 2 (TWO) TIMES DAILY AS NEEDED FOR ANXIETY OR SLEEP. 30 tablet 1   aspirin 81 MG tablet Take 81 mg by mouth daily.     Cholecalciferol (VITAMIN D-3) 5000 units TABS Take 5,000 Units by mouth daily.      DULoxetine (CYMBALTA) 30 MG capsule Take 3 capsules (90 mg total) by mouth daily. Take 3 (30 mg) capsules daily 270 capsule 0   ergocalciferol (VITAMIN D2) 1.25 MG (50000 UT) capsule Take 50,000 Units by mouth once a week.     methylphenidate (RITALIN) 10 MG tablet Take 1 tablet (10 mg total) by mouth 3 (three) times daily with meals. 90 tablet 0   naltrexone (DEPADE) 50 MG tablet TAKE 1/2 TABLET  BY MOUTH DAILY 45 tablet 1   pregabalin (LYRICA) 100 MG capsule Take 1 capsule (100 mg total) by mouth at bedtime. 90 capsule 0   risperiDONE (RISPERDAL) 0.5 MG tablet Take 0.5 mg by mouth as needed. PRN     rOPINIRole (REQUIP) 1 MG tablet TAKE 3 TABLETS (3 MG TOTAL) BY MOUTH AT BEDTIME. 270 tablet 0   Semaglutide,0.25 or 0.5MG /DOS, (OZEMPIC, 0.25 OR 0.5 MG/DOSE,) 2 MG/1.5ML SOPN Inject into the skin.     simvastatin (ZOCOR) 20 MG tablet Take 20 mg by mouth at bedtime.     No current facility-administered medications for this visit.    Medication Side Effects: None sexual SE are better not  All gone.  Allergies:  Allergies  Allergen Reactions   E.E.S. [Erythromycin] Hives   Macrolides And Ketolides Other (See Comments)    EES    Rosuvastatin     Other reaction(s): cramps    Past Medical History:  Diagnosis Date   Allergy    Anemia    iron- pt denies    Anxiety    Aortic cusp regurgitation    Carotid artery occlusion    Constipation    Coronary artery stenosis    Hyperlipidemia    Lactose intolerance    Major depression, recurrent, chronic (HCC)    Obesity    OCD (obsessive compulsive disorder)    OSA (obstructive sleep apnea)    Other chronic pain    Periodic limb movement  disorder    Periodic limb movements of sleep    Prediabetes    Pure hypercholesterolemia    Restless legs    Sleep apnea    wears cpap    Vitamin D deficiency     Family History  Problem Relation Age of Onset   Cancer Mother        breast and ovarian   Anxiety disorder Mother    Breast cancer Mother    Ovarian cancer Mother    Depression Father        bi-polar   Hyperlipidemia Father    Heart disease Father    Sudden death Father    Bipolar disorder Father    Sleep apnea Father    Obesity Father    Depression Son    Colon cancer Neg Hx    Colon polyps Neg Hx    Esophageal cancer Neg Hx    Rectal cancer Neg Hx    Stomach cancer Neg Hx     Social History   Socioeconomic History   Marital status: Married    Spouse name: Not on file   Number of children: Not on file   Years of education: Not on file   Highest education level: Not on file  Occupational History   Occupation: retired Pensions consultant  Tobacco Use   Smoking status: Never   Smokeless tobacco: Never  Substance and Sexual Activity   Alcohol use: Yes    Comment: occasionally    Drug use: No   Sexual activity: Not on file  Other Topics Concern   Not on file  Social History Narrative   Not on file   Social Determinants of Health   Financial Resource Strain: Not on file  Food Insecurity: No Food Insecurity (01/25/2022)   [facility] to [facility]24/[facility]   Hunger Vital Sign    Worried About Running Out of Food in the Last Year:  Never true    Ran Out of Food in the Last Year: Never true  Transportation Needs: No Transportation Needs (01/25/2022)   [facility] prior to 03/24/2022., [facility] prior to 03/24/2022., [facility]   PRAPARE - Transportation    Lack of Transportation (Medical): No     Lack of Transportation (Non-Medical): No  Physical Activity: Not on file  Stress: Not on file  Social Connections: Not on file  Intimate Partner Violence: Not on file    Past Medical History, Surgical history, Social history, and Family history were reviewed and updated as appropriate.   Please see review of systems for further details on the patient's review from today.   Objective:   Physical Exam:  There were no vitals taken for this visit.  Physical Exam Constitutional:      General: He is not in acute distress.    Appearance: He is obese.  Musculoskeletal:        General: No deformity.  Neurological:     Mental Status: He is alert and oriented to person, place, and time.     Cranial Nerves: No dysarthria.     Coordination: Coordination normal.  Psychiatric:        Attention and Perception: Attention and perception normal. He does not perceive auditory or visual hallucinations.        Mood and Affect: Mood is anxious and depressed. Affect is not labile, blunt, angry or inappropriate.        Speech: Speech normal.        Behavior: Behavior normal. Behavior is cooperative.        Thought Content: Thought content normal. Thought content is not paranoid or delusional. Thought content does not include homicidal or suicidal ideation. Thought content does not include suicidal plan.        Cognition and Memory: Cognition and memory normal.        Judgment: Judgment normal.     Comments: Insight intact Residual depression and fatigue  More subdued than usual  Wife reports he's more anxious   November 06, 2018: [place] Cog test in office within normal limits MMSE 28/30. Animal fluency 17 . (borderline) Taken as a whole, no indication to pursue neuropsychological testing.  Mini-Mental status exam 28/30 on 10/27/20.  No evidence of dementia.  Lab Review:   Vitamin D level acceptable at 54.5.   Normal B12 and folate and TSH in last couple of years..  Echocardiogram is stable  re: AVR over the last 8 years and not likely the cause of lethargy.  .res Assessment: Plan:    Lamaj was seen today for follow-up, depression, anxiety and add.  Diagnoses and all orders for this visit:  Major depressive disorder, recurrent episode, moderate (HCC)  Mixed obsessional thoughts and acts  Attention deficit hyperactivity disorder (ADHD), predominantly inattentive type  Hypersomnia with sleep apnea  Restless legs syndrome  Low vitamin D level  Mild cognitive impairment  Obstructive sleep apnea   30 min face to face time with patient was spent on counseling and coordination of care. We discussed Mr. Luchini has a long history of depression and OCD which are partially controlled.  He requires frequent follow-up and his request because of significant residual depression and anxiety and concerns about polypharmacy and he wants regular therapeutic advice on how to further reduce his OCD.  He believes the depression is a consequence of the residual OCD.  He has  some compulsive checking and obsessions around the house maintenance.  He wishes to avoid sexual side effects and so we are keeping the SSRI at the lowest possible dose.  He is tried all of the reasonable SSRI options with the exception possibly of sertraline but it is likely to have more sexual dysfunction than what he is taking now.  When travels then tends to have less OCD bc triggered less.    So far better energy with duloxetine 90 vs Lexapro without change anxierty so far.  No changes.    His OCD is persistent with checking lites, stove, etc. but it is not heavily time-consuming and is mildly to moderately distressing.  Overall his level of depression is mild to moderate with poor motivation to do things which bothers his wife.  Supportive therapy in solving this.Marland Kitchen  He is able to find things that he enjoys and he does have interests but they are reduced below normal for him.Marland Kitchen  He improved activity levels  generally.  And He feels that Ritalin is helpful for energy, productivity, focus and attention.  Fearful about potential BP effects.  Suggest talk with card about that. He wants to stay at the lower dose Ritalin 30 mg AM to protect sleep. Bc delayed sleep phase. Hold stimulant if ill and not able to benefit.  Discussed potential benefits, risks, and side effects of stimulants with patient to include increased heart rate, palpitations, insomnia, increased anxiety, increased irritability, or decreased appetite.  Instructed patient to contact office if experiencing any significant tolerability issues. Disc risk of increasing the Ritalin further elevating BP and pulse if we increase the Ritalin.  Need to verify that it's not markedly elevated from taking the Ritalin. Doesn't think it's been consistently elevated. Disc crash risk.  He doesn't need feel it causes crash.  Disc difference between different types of cognitive difficulties such as Alzheimer's disease for which there is no evidence and ADD related inattentiveness which can contribute to some of the cognitive problems that his wife identifies.  He may also have longstanding underlying ADD.  He has used Ritalin but it is inherently short acting and he is only taking it once a day.    Extensive discussion of sleep study and he has a copy.  Whys is there virtually no N3 & REM sleep?  Is that affecting daytime alertness and fatigue?  Vs how much is related to mild OSA and PLMS?  Disc this in detail.  Wrote coreespondence with sleep doc about it.  Options for sleep & fatigue:  Difficult to assess  bc has been [place] and then sick for 3 weeks.   Focus on reduction in OSA Focus on improving deep stage sleep.  ? Rx low dose mirtazapine or alternatives Focus on leg movements (hx RLS/PLMS) continue Trial as had been suggested Lyrica for FM type sx and may help RLS/PLMS.  Better dreaming on 100 mg HS and too sleepy on 150.  Decrease  to 100 mg HS  for sleep and back pain and RLS No ropinirole needed.   Disc SE.  Not having any.   Use LED Xanax and try to avoid BZ daytime bc fatigue.  He doesn't use it much daythime.  Using 0..25-0.5 mg at night.  May need less with more Lyrica at [facility] and try to minimize.  No hangover. We discussed the short-term risks associated with benzodiazepines including sedation and increased fall risk among others.  Discussed long-term side effect risk including dependence, potential  withdrawal symptoms, and the potential eventual dose-related risk of dementia.  But recent studies from 2020 dispute this association between benzodiazepines and dementia risk. Newer studies in 2020 do not support an association with dementia.  Thinks memory problem relate to OCD bc often counting in the in the background at rest which tends to reduce his attention.  Ritalin is being successfully used off label to augment antidepressants for depression and have resulted in improved productivity and attention.  Previous screening of memory was not suggestive of any neuro degenerative process. Mini-Mental status exam 28/30 on 10/27/20.  No evidence of dementia.  He has lost 47 # pounds on Ozempic. Disc use of this for weight loss.  This will help a number of things including joints.   Option sildenafil .  He wishes to defer.  No problem with the reduction in ropinirole. RLS managed.  Still no complaints. Disc also dx PLMS.  Wife denies noticing it lately.  Supportive therapy and problem solving around distinguishing decision from OCD.  [place] getting back to exercise.  Disc naltrexone and wt loss given his lack of sufficient repsonse with Ozempic 2mg  weekly.  Disc dosing.  He is finding it helpful.   Started seeing [employer] counseling again.  Follow-up 4 weeks per pt request  Meredith Staggers MD, DFAPA.  Please see After Visit Summary for patient specific instructions.  Future Appointments  Date Time Provider Department Center   12/18/2022  1:00 PM Cottle, Steva Ready., MD [facility] None  01/21/2023  1:00 PM Cottle, Steva Ready., MD [facility] None  02/18/2023  1:00 PM Cottle, Steva Ready., MD [facility] None  03/19/2023  2:00 PM Cottle, Steva Ready., MD [facility] None  04/16/2023  1:00 PM Cottle, Steva Ready., MD [facility] None  05/21/2023  1:00 PM Cottle, Steva Ready., MD [facility] None  06/18/2023  2:00 PM Cottle, Steva Ready., MD [facility] None  07/16/2023  2:00 PM Cottle, Steva Ready., MD [facility] None     No orders of the defined types were placed in this encounter.      -------------------------------

## 2022-11-16 DIAGNOSIS — G4733 Obstructive sleep apnea (adult) (pediatric): Secondary | ICD-10-CM | POA: Diagnosis not present

## 2022-11-29 DIAGNOSIS — E7800 Pure hypercholesterolemia, unspecified: Secondary | ICD-10-CM | POA: Diagnosis not present

## 2022-11-29 DIAGNOSIS — R7303 Prediabetes: Secondary | ICD-10-CM | POA: Diagnosis not present

## 2022-11-29 DIAGNOSIS — E66812 Obesity, class 2: Secondary | ICD-10-CM | POA: Diagnosis not present

## 2022-11-29 DIAGNOSIS — Z6835 Body mass index (BMI) 35.0-35.9, adult: Secondary | ICD-10-CM | POA: Diagnosis not present

## 2022-11-29 DIAGNOSIS — G4733 Obstructive sleep apnea (adult) (pediatric): Secondary | ICD-10-CM | POA: Diagnosis not present

## 2022-12-18 ENCOUNTER — Ambulatory Visit: Payer: Medicare Other | Admitting: Psychiatry

## 2022-12-18 ENCOUNTER — Encounter: Payer: Self-pay | Admitting: Psychiatry

## 2022-12-18 VITALS — BP 145/72 | HR 77

## 2022-12-18 DIAGNOSIS — G4730 Sleep apnea, unspecified: Secondary | ICD-10-CM

## 2022-12-18 DIAGNOSIS — F422 Mixed obsessional thoughts and acts: Secondary | ICD-10-CM | POA: Diagnosis not present

## 2022-12-18 DIAGNOSIS — F900 Attention-deficit hyperactivity disorder, predominantly inattentive type: Secondary | ICD-10-CM | POA: Diagnosis not present

## 2022-12-18 DIAGNOSIS — G4710 Hypersomnia, unspecified: Secondary | ICD-10-CM | POA: Diagnosis not present

## 2022-12-18 DIAGNOSIS — F331 Major depressive disorder, recurrent, moderate: Secondary | ICD-10-CM

## 2022-12-18 DIAGNOSIS — R7989 Other specified abnormal findings of blood chemistry: Secondary | ICD-10-CM

## 2022-12-18 DIAGNOSIS — G3184 Mild cognitive impairment, so stated: Secondary | ICD-10-CM

## 2022-12-18 DIAGNOSIS — G2581 Restless legs syndrome: Secondary | ICD-10-CM

## 2022-12-18 MED ORDER — METHYLPHENIDATE HCL 10 MG PO TABS: 10.0000 mg | ORAL_TABLET | Freq: Three times a day (TID) | ORAL | 0 refills | Status: DC

## 2022-12-18 NOTE — Progress Notes (Signed)
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 risperidone. In general trouble with motivation to do things.   Avoiding Ritalin bc concerns about aortic aneurysm.   Trouble dealing with mail and throwing things away.  Piles of things around the house.   Residual OCD issues with light switches.  Stable.  Wife things he obsesses more than he admits.   Sleep 11 hours and often naps.   Sometimes poor sleep.  B schizophrenic SUI. After M's death. PCP Kendra Opitz at [facility] at 72 years old.  Prior psychiatric medication trials include  Lexapro 20, citalopram NR, clomipramine weight gain, paroxetine, fluoxetine, Luvox, Trintellix,   Increase Lexapro back to 20 mg January 2020. & 10/2020 bupropion,  Auvelity NR  Abilify 10 fatigue,  Cerefolin NAC, and   Naltrexone sexual SE Lyrica 150 tired  pramipexole,  ropinirole Adderall XR & IR sexual SE, Ritalin 30, Concerta 72 mg AM NR modafinil and Nuvigil,   History [employer] OCD  Review of Systems:  Review of Systems  Constitutional:   Positive for fatigue. Negative for fever.  Cardiovascular:  Negative for chest pain and palpitations.  Genitourinary:        ED  Musculoskeletal:  Positive for arthralgias and myalgias.  Neurological:  Negative for tremors and weakness.  Psychiatric/Behavioral:  Positive for dysphoric mood and sleep disturbance. Negative for agitation, behavioral problems, confusion, decreased concentration, hallucinations, self-injury and suicidal ideas. The patient is nervous/anxious. The patient is not hyperactive.     Medications: I have reviewed the patient's current medications.  Current Outpatient Medications  Medication Sig Dispense Refill   ALPRAZolam (XANAX) 0.25 MG tablet TAKE 1 TABLET (0.25 MG TOTAL) BY MOUTH 2 (TWO) TIMES DAILY AS NEEDED  FOR ANXIETY OR SLEEP. 30 tablet 1   aspirin 81 MG tablet Take 81 mg by mouth daily.     Cholecalciferol (VITAMIN D-3) 5000 units TABS Take 5,000 Units by mouth daily.      DULoxetine (CYMBALTA) 30 MG capsule Take 3 capsules (90 mg total) by mouth daily. Take 3 (30 mg) capsules daily 270 capsule 0   ergocalciferol (VITAMIN D2) 1.25 MG (50000 UT) capsule Take 50,000 Units by mouth once a week.     LINZESS 145 MCG CAPS capsule Take 145 mcg by mouth daily before breakfast.     naltrexone (DEPADE) 50 MG tablet TAKE 1/2 TABLET BY MOUTH DAILY 45 tablet 1   pregabalin (LYRICA) 100 MG capsule Take 1 capsule (100 mg total) by mouth at bedtime. 90 capsule 0   Semaglutide,0.25 or 0.5MG /DOS, (OZEMPIC, 0.25 OR 0.5 MG/DOSE,) 2 MG/1.5ML SOPN Inject into the skin.     simvastatin (ZOCOR) 20 MG tablet Take 20 mg by mouth at bedtime.     methylphenidate (RITALIN) 10 MG tablet Take 1 tablet (10 mg total) by mouth 3 (three) times daily with meals. (Patient not taking: Reported on 12/18/2022) 90 tablet 0   risperiDONE (RISPERDAL) 0.5 MG tablet Take 0.5 mg by mouth as needed. PRN (Patient not taking: Reported on 12/18/2022)     rOPINIRole (REQUIP) 1 MG tablet TAKE 3 TABLETS (3 MG  TOTAL) BY MOUTH AT BEDTIME. (Patient not taking: Reported on 12/18/2022) 270 tablet 0   No current facility-administered medications for this visit.    Medication Side Effects: None sexual SE are better not  All gone.  Allergies:  Allergies  Allergen Reactions   E.E.S. [Erythromycin] Hives   Macrolides And Ketolides Other (See Comments)    EES    Rosuvastatin     Other reaction(s): cramps    Past Medical History:  Diagnosis Date   Allergy    Anemia    iron- pt denies    Anxiety    Aortic cusp regurgitation    Carotid artery occlusion    Constipation    Coronary artery stenosis    Hyperlipidemia    Lactose intolerance    Major depression, recurrent, chronic (HCC)    Obesity    OCD (obsessive compulsive disorder)    OSA (obstructive sleep apnea)    Other chronic pain    Periodic limb movement disorder    Periodic limb movements of sleep    Prediabetes    Pure hypercholesterolemia    Restless legs    Sleep apnea    wears cpap    Vitamin D deficiency     Family History  Problem Relation Age of Onset   Cancer Mother        breast and ovarian   Anxiety disorder Mother    Breast cancer Mother    Ovarian cancer Mother    Depression Father        bi-polar   Hyperlipidemia Father    Heart disease Father    Sudden death Father    Bipolar disorder Father    Sleep apnea Father    Obesity Father    Depression Son    Colon cancer Neg Hx    Colon polyps Neg Hx    Esophageal cancer Neg Hx    Rectal cancer Neg Hx    Stomach cancer Neg Hx     Social History   Socioeconomic History   Marital status: Married    Spouse name: Not on file   Number of  children: Not on file   Years of education: Not on file   Highest education level: Not on file  Occupational History   Occupation: retired Pensions consultant  Tobacco Use   Smoking status: Never   Smokeless tobacco: Never  Substance and Sexual Activity   Alcohol use: Yes    Comment: occasionally    Drug use: No   Sexual  activity: Not on file  Other Topics Concern   Not on file  Social History Narrative   Not on file   Social Determinants of Health   Financial Resource Strain: Not on file  Food Insecurity: No Food Insecurity (01/25/2022)   [facility] to [facility] to [facility]24[facility]   Hunger Vital Sign    Worried About Running Out of Food in the Last Year: Never true    Ran Out of Food in the Last Year: Never true  Transportation Needs: No Transportation Needs (01/25/2022)   Received from [employer], [facility] visits prior to 03/24/2022., [facility] visits prior to 03/24/2022., [facility]   PRAPARE - Transportation    Lack of Transportation (Medical): No    Lack of Transportation (Non-Medical): No  Physical Activity: Not on file  Stress: Not on file  Social Connections: Not on file  Intimate Partner Violence: Not on file    Past Medical History, Surgical history, Social history, and Family history were reviewed and updated as appropriate.   Please see review of systems for further details on the patient's review from today.   Objective:   Physical Exam:  BP (!) 145/72   Pulse 77   Physical Exam Constitutional:      General: He is not in acute distress.    Appearance: He is obese.  Musculoskeletal:        General: No deformity.  Neurological:     Mental Status: He is alert and oriented to person, place, and time.     Cranial Nerves: No dysarthria.     Coordination: Coordination normal.  Psychiatric:        Attention and Perception: Attention and perception normal. He does not perceive auditory or visual hallucinations.        Mood and Affect: Mood is anxious and depressed. Affect is not labile, blunt, angry or inappropriate.        Speech: Speech normal.        Behavior: Behavior normal. Behavior is cooperative.         Thought Content: Thought content normal. Thought content is not paranoid or delusional. Thought content does not include homicidal or suicidal ideation. Thought content does not include suicidal plan.        Cognition and Memory: Cognition and memory normal.        Judgment: Judgment normal.     Comments: Insight intact Residual depression and fatigue  More subdued than usual     November 06, 2018: [place] Cog test in office within normal limits MMSE 28/30. Animal fluency 17 . (borderline) Taken as a whole, no indication to pursue neuropsychological testing.  Mini-Mental status exam 28/30 on 10/27/20.  No evidence of dementia.  Lab Review:   Vitamin D level acceptable at 54.5.   Normal B12 and folate and TSH in last couple of years..  Echocardiogram is stable re: AVR over the last 8 years and not likely the cause of lethargy.  .res Assessment:  Plan:    Jahaven was seen today for follow-up, depression, anxiety and sleeping problem.  Diagnoses and all orders for this visit:  Major depressive disorder, recurrent episode, moderate (HCC)  Mixed obsessional thoughts and acts  Attention deficit hyperactivity disorder (ADHD), predominantly inattentive type  Hypersomnia with sleep apnea  Restless legs syndrome  Low vitamin D level  Mild cognitive impairment   30 min face to face time with patient was spent on counseling and coordination of care. We discussed Mr. Shabani has a long history of depression and OCD which are partially controlled.  He requires frequent follow-up and his request because of significant residual depression and anxiety and concerns about polypharmacy and he wants regular therapeutic advice on how to further reduce his OCD.  He believes the depression is a consequence of the residual OCD.  He has some compulsive checking and obsessions around the house maintenance.  He wishes to avoid sexual side effects and so we are keeping the SSRI at the lowest  possible dose.  He is tried all of the reasonable SSRI options with the exception possibly of sertraline but it is likely to have more sexual dysfunction than what he is taking now.  When travels then tends to have less OCD bc triggered less.    So far better energy with duloxetine 90 vs Lexapro without change anxierty so far.  No changes.    His OCD is persistent with checking lites, stove, etc. but it is not heavily time-consuming and is mildly to moderately distressing.  Overall his level of depression is mild to moderate with poor motivation to do things which bothers his wife.  Supportive therapy in solving this.Marland Kitchen  He is able to find things that he enjoys and he does have interests but they are reduced below normal for him.Marland Kitchen  He improved activity levels generally.  And He feels that Ritalin is helpful for energy, productivity, focus and attention.  Fearful about potential BP effects.  Suggest talk with card about that. He wants to stay at the lower dose Ritalin 30 mg AM to protect sleep. Bc delayed sleep phase.  He wants to resume and watch BP to make sure it is ok.   Hold stimulant if ill and not able to benefit.  Discussed potential benefits, risks, and side effects of stimulants with patient to include increased heart rate, palpitations, insomnia, increased anxiety, increased irritability, or decreased appetite.  Instructed patient to contact office if experiencing any significant tolerability issues. Disc risk of increasing the Ritalin further elevating BP and pulse if we increase the Ritalin.  Need to verify that it's not markedly elevated from taking the Ritalin. Doesn't think it's been consistently elevated. Disc crash risk.  He doesn't need feel it causes crash.  Disc difference between different types of cognitive difficulties such as Alzheimer's disease for which there is no evidence and ADD related inattentiveness which can contribute to some of the cognitive problems that his wife  identifies.  He may also have longstanding underlying ADD.  He has used Ritalin but it is inherently short acting and he is only taking it once a day.    Extensive discussion of sleep study and he has a copy.  Whys is there virtually no N3 & REM sleep?  Is that affecting daytime alertness and fatigue?  Vs how much is related to mild OSA and PLMS?  Disc this in detail.  Wrote coreespondence with sleep doc about it.  Options for sleep & fatigue:  Difficult  to assess  bc [place] and then sick for 3 weeks.   Focus on reduction in OSA Focus on improving deep stage sleep.  ? Rx low dose mirtazapine or alternatives Focus on leg movements (hx RLS/PLMS) continue Trial as had been suggested Lyrica for FM type sx and may help RLS/PLMS.  Better dreaming on 100 mg HS and too sleepy on 150.  Decrease  to 100 mg HS for sleep and back pain and RLS No ropinirole needed.   Disc SE.  Not having any.   Use LED Xanax and try to avoid BZ daytime bc fatigue.  He doesn't use it much daythime.  Using 0..25-0.5 mg at night.  May need less with more Lyrica at [facility] and try to minimize.  No hangover. We discussed the short-term risks associated with benzodiazepines including sedation and increased fall risk among others.  Discussed long-term side effect risk including dependence, potential withdrawal symptoms, and the potential eventual dose-related risk of dementia.  But recent studies from 2020 dispute this association between benzodiazepines and dementia risk. Newer studies in 2020 do not support an association with dementia.  Thinks memory problem relate to OCD bc often counting in the in the background at rest which tends to reduce his attention.  Ritalin is being successfully used off label to augment antidepressants for depression and have resulted in improved productivity and attention.  Previous screening of memory was not suggestive of any neuro degenerative process. Mini-Mental status exam 28/30 on  10/27/20.  No evidence of dementia.  He has lost 47 # pounds on Ozempic. Disc use of this for weight loss.  This will help a number of things including joints.   Option sildenafil .  He wishes to defer.  No problem with the reduction in ropinirole. RLS managed.  Still no complaints. Disc also dx PLMS.  Wife denies noticing it lately.  Supportive therapy and problem solving around distinguishing decision from OCD.  [place] getting back to exercise.  Disc naltrexone and wt loss given his lack of sufficient repsonse with Ozempic 2mg  weekly.  Disc dosing.  He is finding it helpful.   Plan: Meds: switched to duloxetine to 90 mg daily.  Off Lexapro.  Others as noted. Lyrica 100 mg HS. Has held Ritalin 10 TID, can resume as long as SBP below 140 per card.  none needed ropinirole 3 mg pm.  No risperidone.  Started seeing [employer] counseling again.  Follow-up 4 weeks per pt request  Meredith Staggers MD, DFAPA.  Please see After Visit Summary for patient specific instructions.  Future Appointments  Date Time Provider Department Center  01/21/2023  1:00 PM Cottle, Steva Ready., MD [facility] None  02/18/2023  1:00 PM Cottle, Steva Ready., MD [facility] None  03/19/2023  2:00 PM Cottle, Steva Ready., MD [facility] None  04/16/2023  1:00 PM Cottle, Steva Ready., MD [facility] None  05/21/2023  1:00 PM Cottle, Steva Ready., MD [facility] None  06/18/2023  2:00 PM Cottle, Steva Ready., MD [facility] None  07/16/2023  2:00 PM Cottle, Steva Ready., MD [facility] None     No orders of the defined types were placed in this encounter.      -------------------------------

## 2022-12-27 DIAGNOSIS — E7800 Pure hypercholesterolemia, unspecified: Secondary | ICD-10-CM | POA: Diagnosis not present

## 2022-12-27 DIAGNOSIS — R7303 Prediabetes: Secondary | ICD-10-CM | POA: Diagnosis not present

## 2022-12-27 DIAGNOSIS — I779 Disorder of arteries and arterioles, unspecified: Secondary | ICD-10-CM | POA: Diagnosis not present

## 2022-12-27 DIAGNOSIS — Z6834 Body mass index (BMI) 34.0-34.9, adult: Secondary | ICD-10-CM | POA: Diagnosis not present

## 2022-12-27 DIAGNOSIS — E66811 Obesity, class 1: Secondary | ICD-10-CM | POA: Diagnosis not present

## 2022-12-27 DIAGNOSIS — E6609 Other obesity due to excess calories: Secondary | ICD-10-CM | POA: Diagnosis not present

## 2022-12-31 DIAGNOSIS — Q2381 Bicuspid aortic valve: Secondary | ICD-10-CM | POA: Diagnosis not present

## 2022-12-31 DIAGNOSIS — I7121 Aneurysm of the ascending aorta, without rupture: Secondary | ICD-10-CM | POA: Diagnosis not present

## 2023-01-01 DIAGNOSIS — Q2381 Bicuspid aortic valve: Secondary | ICD-10-CM | POA: Diagnosis not present

## 2023-01-01 DIAGNOSIS — I77810 Thoracic aortic ectasia: Secondary | ICD-10-CM | POA: Diagnosis not present

## 2023-01-03 DIAGNOSIS — K080 Exfoliation of teeth due to systemic causes: Secondary | ICD-10-CM | POA: Diagnosis not present

## 2023-01-12 DIAGNOSIS — R197 Diarrhea, unspecified: Secondary | ICD-10-CM | POA: Diagnosis not present

## 2023-01-21 ENCOUNTER — Ambulatory Visit: Payer: Medicare Other | Admitting: Psychiatry

## 2023-01-21 ENCOUNTER — Encounter: Payer: Self-pay | Admitting: Psychiatry

## 2023-01-21 DIAGNOSIS — F422 Mixed obsessional thoughts and acts: Secondary | ICD-10-CM

## 2023-01-21 DIAGNOSIS — G4733 Obstructive sleep apnea (adult) (pediatric): Secondary | ICD-10-CM

## 2023-01-21 DIAGNOSIS — F331 Major depressive disorder, recurrent, moderate: Secondary | ICD-10-CM

## 2023-01-21 DIAGNOSIS — G4710 Hypersomnia, unspecified: Secondary | ICD-10-CM | POA: Diagnosis not present

## 2023-01-21 DIAGNOSIS — G8929 Other chronic pain: Secondary | ICD-10-CM

## 2023-01-21 DIAGNOSIS — R7989 Other specified abnormal findings of blood chemistry: Secondary | ICD-10-CM

## 2023-01-21 DIAGNOSIS — F900 Attention-deficit hyperactivity disorder, predominantly inattentive type: Secondary | ICD-10-CM

## 2023-01-21 DIAGNOSIS — G4730 Sleep apnea, unspecified: Secondary | ICD-10-CM

## 2023-01-21 DIAGNOSIS — G3184 Mild cognitive impairment, so stated: Secondary | ICD-10-CM

## 2023-01-21 DIAGNOSIS — G2581 Restless legs syndrome: Secondary | ICD-10-CM

## 2023-01-21 MED ORDER — PREGABALIN 100 MG PO CAPS: 100.0000 mg | ORAL_CAPSULE | Freq: Every evening | ORAL | 3 refills | Status: DC

## 2023-01-21 MED ORDER — METHYLPHENIDATE HCL 10 MG PO TABS: 10.0000 mg | ORAL_TABLET | Freq: Three times a day (TID) | ORAL | 0 refills | Status: DC

## 2023-01-21 NOTE — Progress Notes (Signed)
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 trouble with motivation to do things.   Avoiding Ritalin bc concerns about aortic aneurysm.   Trouble dealing with mail and throwing things away.  Piles of things around the house.   Residual OCD issues with light switches.  Stable.  Wife things he obsesses more than he admits.   Sleep 11 hours and often naps.   Sometimes poor sleep. Plan  no changes  01/21/23 appt noted: Worrying too much bc OCD.  Trying to decide about how to deal with a trigger lately.  OCD driving me crazy wanting to make things perfect.   Other than the trigger had a nice time since here.  Eric [name] here for 5 days at 72 yo.  Enjoyed that.   Taking semaglutide  down to 250# from 283#. Card at [facility] says he does not have Aortic aneurysm.  Measuring is OK.  Will FU with MRI in June.   Some diarrhea lately. Cannot stop worrying.  Triggered by the plumbing issue. Meds as above.  No SE of sig.    B schizophrenic SUI. After M's death. PCP Kendra Opitz at [facility] at 72 years  old.  Prior psychiatric medication trials include  Lexapro 20, citalopram NR, clomipramine weight gain, paroxetine, fluoxetine, Luvox, Trintellix,   Increase Lexapro back to 20 mg January 2020. & 10/2020 bupropion,  Auvelity NR  Abilify 10 fatigue,  Cerefolin NAC, and   Naltrexone sexual SE Lyrica 150 tired  pramipexole,  ropinirole Adderall XR & IR sexual SE, Ritalin 30, Concerta 72 mg AM NR modafinil and Nuvigil,   History [employer] OCD  Review of Systems:  Review of Systems  Constitutional:  Positive for fatigue. Negative for  fever.  Cardiovascular:  Negative for chest pain and palpitations.  Gastrointestinal:  Positive for diarrhea.  Genitourinary:        ED  Musculoskeletal:  Positive for arthralgias and myalgias.  Neurological:  Negative for tremors and weakness.  Psychiatric/Behavioral:  Positive for dysphoric mood and sleep disturbance. Negative for agitation, behavioral problems, confusion, decreased concentration, hallucinations, self-injury and suicidal ideas. The patient is nervous/anxious. The patient is not hyperactive.     Medications: I have reviewed the patient's current medications.  Current Outpatient Medications  Medication Sig Dispense Refill   ALPRAZolam (XANAX) 0.25 MG tablet TAKE 1 TABLET (0.25 MG TOTAL) BY MOUTH 2 (TWO) TIMES DAILY AS NEEDED FOR ANXIETY OR SLEEP. 30 tablet 1   aspirin 81 MG tablet Take 81 mg by mouth daily.     Cholecalciferol (VITAMIN D-3) 5000 units TABS Take 5,000 Units by mouth daily.      DULoxetine (CYMBALTA) 30 MG capsule Take 3 capsules (90 mg total) by mouth daily. Take 3 (30 mg) capsules daily 270 capsule 0   ergocalciferol (VITAMIN D2) 1.25 MG (50000 UT) capsule Take 50,000 Units by mouth once a week.     LINZESS 145 MCG CAPS capsule Take 145 mcg by mouth daily before breakfast. (Patient not taking: Reported on 01/21/2023)     naltrexone (DEPADE) 50 MG tablet TAKE 1/2 TABLET BY MOUTH DAILY 45 tablet 1   risperiDONE  (RISPERDAL) 0.5 MG tablet Take 0.5 mg by mouth as needed. PRN     rOPINIRole (REQUIP) 1 MG tablet TAKE 3 TABLETS (3 MG TOTAL) BY MOUTH AT BEDTIME. 270 tablet 0   Semaglutide,0.25 or 0.5MG /DOS, (OZEMPIC, 0.25 OR 0.5 MG/DOSE,) 2 MG/1.5ML SOPN Inject 2.4 mg into the skin once a week.     simvastatin (ZOCOR) 20 MG tablet Take 20 mg by mouth at bedtime.     methylphenidate (RITALIN) 10 MG tablet Take 1 tablet (10 mg total) by mouth 3 (three) times daily with meals. 90 tablet 0   pregabalin (LYRICA) 100 MG capsule Take 1 capsule (100 mg total) by mouth at bedtime. 90 capsule 3   No current facility-administered medications for this visit.    Medication Side Effects: None sexual SE are better not  All gone.  Allergies:  Allergies  Allergen Reactions   E.E.S. [Erythromycin] Hives   Macrolides And Ketolides Other (See Comments)    EES    Rosuvastatin     Other reaction(s): cramps    Past Medical History:  Diagnosis Date   Allergy    Anemia    iron- pt denies    Anxiety    Aortic cusp regurgitation    Carotid artery occlusion    Constipation    Coronary artery stenosis    Hyperlipidemia    Lactose intolerance    Major depression, recurrent, chronic (HCC)    Obesity    OCD (obsessive compulsive disorder)    OSA (obstructive sleep apnea)    Other chronic pain    Periodic limb movement disorder    Periodic limb movements of sleep    Prediabetes    Pure hypercholesterolemia    Restless legs    Sleep apnea    wears cpap    Vitamin D deficiency     Family History  Problem Relation Age of Onset   Cancer Mother        breast and ovarian   Anxiety disorder Mother    Breast cancer Mother    Ovarian cancer Mother    Depression  Father        bi-polar   Hyperlipidemia Father    Heart disease Father    Sudden death Father    Bipolar disorder Father    Sleep apnea Father    Obesity Father    Depression Son    Colon cancer Neg Hx    Colon polyps Neg Hx    Esophageal cancer  Neg Hx    Rectal cancer Neg Hx    Stomach cancer Neg Hx     Social History   Socioeconomic History   Marital status: Married    Spouse name: Not on file   Number of children: Not on file   Years of education: Not on file   Highest education level: Not on file  Occupational History   Occupation: retired Pensions consultant  Tobacco Use   Smoking status: Never   Smokeless tobacco: Never  Substance and Sexual Activity   Alcohol use: Yes    Comment: occasionally    Drug use: No   Sexual activity: Not on file  Other Topics Concern   Not on file  Social History Narrative   Not on file   Social Drivers of Health   Financial Resource Strain: Not on file  Food Insecurity: No Food Insecurity (01/25/2022)   [facility] prior to [facility]24/[facility]   Hunger Vital Sign    Worried About Running Out of Food in the Last Year: Never true    Ran Out of Food in the Last Year: Never true  Transportation Needs: No Transportation Needs (01/25/2022)   [facility], [facility] visits prior to 03/24/2022., [facility] visits prior to [facility]/24/[facility]22., [facility]   PRAPARE - Transportation    Lack of Transportation (Medical): No    Lack of Transportation (Non-Medical): No  Physical Activity: Not on file  Stress: Not on file  Social Connections: Not on file  Intimate Partner Violence: Not on file    Past Medical History, Surgical history, Social history, and Family history were reviewed and updated as appropriate.   Please see review of systems for further details on the patient's review from today.   Objective:   Physical Exam:  There were no vitals taken for this visit.  Physical Exam Constitutional:      General: He is not in acute distress.    Appearance: He is obese.  Musculoskeletal:        General: No deformity.   Neurological:     Mental Status: He is alert and oriented to person, place, and time.     Cranial Nerves: No dysarthria.     Coordination: Coordination normal.  Psychiatric:        Attention and Perception: Attention and perception normal. He does not perceive auditory or visual hallucinations.        Mood and Affect: Mood is anxious and depressed. Affect is not labile, blunt, angry or inappropriate.        Speech: Speech normal.        Behavior: Behavior normal. Behavior is cooperative.        Thought Content: Thought content normal. Thought content is not paranoid or delusional. Thought content does not include homicidal or suicidal ideation. Thought content does not include suicidal plan.        Cognition and Memory: Cognition and memory normal.  Judgment: Judgment normal.     Comments: Insight intact Residual depression and fatigue  More obsessing lately.    November 06, 2018: [place] Cog test in office within normal limits MMSE 28/30. Animal fluency 17 . (borderline) Taken as a whole, no indication to pursue neuropsychological testing.  Mini-Mental status exam 28/30 on 10/27/20.  No evidence of dementia.  Lab Review:   Vitamin D level acceptable at 54.5.   Normal B12 and folate and TSH in last couple of years..  Echocardiogram is stable re: AVR over the last 8 years and not likely the cause of lethargy.  .res Assessment: Plan:    Marvis was seen today for follow-up, depression and anxiety.  Diagnoses and all orders for this visit:  Major depressive disorder, recurrent episode, moderate (HCC)  Mixed obsessional thoughts and acts  Attention deficit hyperactivity disorder (ADHD), predominantly inattentive type -     methylphenidate (RITALIN) 10 MG tablet; Take 1 tablet (10 mg total) by mouth 3 (three) times daily with meals.  Hypersomnia with sleep apnea -     pregabalin (LYRICA) 100 MG capsule; Take 1 capsule (100 mg total) by mouth at bedtime.  Restless  legs syndrome -     pregabalin (LYRICA) 100 MG capsule; Take 1 capsule (100 mg total) by mouth at bedtime.  Low vitamin D level  Mild cognitive impairment  Obstructive sleep apnea  Other chronic pain -     pregabalin (LYRICA) 100 MG capsule; Take 1 capsule (100 mg total) by mouth at bedtime.    30 min face to face time with patient was spent on counseling and coordination of care. We discussed Mr. Penland has a long history of depression and OCD which are partially controlled.  He requires frequent follow-up and his request because of significant residual depression and anxiety and concerns about polypharmacy and he wants regular therapeutic advice on how to further reduce his OCD.  He believes the depression is a consequence of the residual OCD.  He has some compulsive checking and obsessions around the house maintenance.  He wishes to avoid sexual side effects and so we are keeping the SSRI at the lowest possible dose.  He is tried all of the reasonable SSRI options with the exception possibly of sertraline but it is likely to have more sexual dysfunction than what he is taking now.  When travels then tends to have less OCD bc triggered less.    So far better energy with duloxetine 90 vs Lexapro without change anxierty so far.  No changes.    His OCD is persistent with checking lites, stove, etc. .  It is worse at the moment DT a trigger involving his home.  Disc CBT techniques and potential for more therapy to address. Option Dr. Farrel Demark.  Overall his level of depression is mild to moderate with poor motivation to do things which bothers his wife.  Supportive therapy in solving this.Marland Kitchen  He is able to find things that he enjoys and he does have interests but they are reduced below normal for him.Marland Kitchen  He improved activity levels generally.  And He feels that Ritalin is helpful for energy, productivity, focus and attention.  Fearful about potential BP effects.  Suggest talk with card about  that. He wants to stay at the lower dose Ritalin 30 mg AM to protect sleep. Bc delayed sleep phase.  He wants to resume and watch BP to make sure it is ok.   Hold stimulant if ill and not able  to benefit.  Discussed potential benefits, risks, and side effects of stimulants with patient to include increased heart rate, palpitations, insomnia, increased anxiety, increased irritability, or decreased appetite.  Instructed patient to contact office if experiencing any significant tolerability issues. Disc risk of increasing the Ritalin further elevating BP and pulse if we increase the Ritalin.  Need to verify that it's not markedly elevated from taking the Ritalin. Doesn't think it's been consistently elevated. Disc crash risk.  He doesn't need feel it causes crash.  Disc difference between different types of cognitive difficulties such as Alzheimer's disease for which there is no evidence and ADD related inattentiveness which can contribute to some of the cognitive problems that his wife identifies.  He may also have longstanding underlying ADD.  He has used Ritalin but it is inherently short acting and he is only taking it once a day.    Extensive discussion of sleep study and he has a copy.  Whys is there virtually no N3 & REM sleep?  Is that affecting daytime alertness and fatigue?  Vs how much is related to mild OSA and PLMS?  Disc this in detail.  Wrote coreespondence with sleep doc about it.  Options for sleep & fatigue:  Difficult to assess  bc [place] and then sick for 3 weeks.   Focus on reduction in OSA Focus on improving deep stage sleep.  ? Rx low dose mirtazapine or alternatives Focus on leg movements (hx RLS/PLMS) continue Trial as had been suggested Lyrica for FM type sx and may help RLS/PLMS.  Better dreaming on 100 mg HS and too sleepy on 150.  Decrease  to 100 mg HS for sleep and back pain and RLS No ropinirole needed.   Disc SE.  Not having any.   Use LED Xanax and  try to avoid BZ daytime bc fatigue.  He doesn't use it much daythime.  Using 0..25-0.5 mg at night.  May need less with more Lyrica at [facility] and try to minimize.  No hangover. We discussed the short-term risks associated with benzodiazepines including sedation and increased fall risk among others.  Discussed long-term side effect risk including dependence, potential withdrawal symptoms, and the potential eventual dose-related risk of dementia.  But recent studies from 2020 dispute this association between benzodiazepines and dementia risk. Newer studies in 2020 do not support an association with dementia.  Thinks memory problem relate to OCD bc often counting in the in the background at rest which tends to reduce his attention.  Ritalin is being successfully used off label to augment antidepressants for depression and have resulted in improved productivity and attention.  Previous screening of memory was not suggestive of any neuro degenerative process. Mini-Mental status exam 28/30 on 10/27/20.  No evidence of dementia.  He has lost sig weight on Ozempic. Disc use of this for weight loss.  This will help a number of things including joints.   Option sildenafil .  He wishes to defer.  No problem with the reduction in ropinirole. RLS managed.  Still no complaints. Disc also dx PLMS.  Wife denies noticing it lately.  Supportive therapy and problem solving around distinguishing decision from OCD.  [place] getting back to exercise.  Disc naltrexone and wt loss given his lack of sufficient repsonse with Ozempic 2mg  weekly.  Disc dosing.  He is finding it helpful.   Plan: Meds: switched to duloxetine to 90 mg daily.  Off Lexapro.  Others as noted. Lyrica 100  mg HS. Has held Ritalin 10 TID, can resume as long as SBP below 140 per card.  none needed ropinirole 3 mg pm.  No risperidone.  Started seeing [employer] [facility] counseling again.  Follow-up 4 weeks per pt request  Meredith Staggers MD, DFAPA.  Please see After  Visit Summary for patient specific instructions.  Future Appointments  Date Time Provider Department Center  02/18/2023  1:00 PM Cottle, Steva Ready., MD [facility] None  03/19/2023  2:00 PM Cottle, Steva Ready., MD [facility] None  04/16/2023  1:00 PM Cottle, Steva Ready., MD [facility] None  05/21/2023  1:00 PM Cottle, Steva Ready., MD [facility] None  06/18/2023  2:00 PM Cottle, Steva Ready., MD [facility] None  07/16/2023  2:00 PM Cottle, Steva Ready., MD [facility] None     No orders of the defined types were placed in this encounter.      -------------------------------

## 2023-01-21 NOTE — Patient Instructions (Signed)
Option seeing Dr. Marliss Czar for OCD

## 2023-01-27 ENCOUNTER — Other Ambulatory Visit: Payer: Self-pay | Admitting: Psychiatry

## 2023-01-27 DIAGNOSIS — G8929 Other chronic pain: Secondary | ICD-10-CM

## 2023-01-27 DIAGNOSIS — F331 Major depressive disorder, recurrent, moderate: Secondary | ICD-10-CM

## 2023-01-27 DIAGNOSIS — F422 Mixed obsessional thoughts and acts: Secondary | ICD-10-CM

## 2023-01-27 DIAGNOSIS — G4710 Hypersomnia, unspecified: Secondary | ICD-10-CM

## 2023-01-27 DIAGNOSIS — G2581 Restless legs syndrome: Secondary | ICD-10-CM

## 2023-01-31 DIAGNOSIS — E66811 Obesity, class 1: Secondary | ICD-10-CM | POA: Diagnosis not present

## 2023-01-31 DIAGNOSIS — R7303 Prediabetes: Secondary | ICD-10-CM | POA: Diagnosis not present

## 2023-01-31 DIAGNOSIS — E7800 Pure hypercholesterolemia, unspecified: Secondary | ICD-10-CM | POA: Diagnosis not present

## 2023-01-31 DIAGNOSIS — I779 Disorder of arteries and arterioles, unspecified: Secondary | ICD-10-CM | POA: Diagnosis not present

## 2023-02-14 DIAGNOSIS — G4733 Obstructive sleep apnea (adult) (pediatric): Secondary | ICD-10-CM | POA: Diagnosis not present

## 2023-02-18 ENCOUNTER — Ambulatory Visit: Payer: Medicare Other | Admitting: Psychiatry

## 2023-02-18 ENCOUNTER — Encounter: Payer: Self-pay | Admitting: Psychiatry

## 2023-02-18 VITALS — BP 134/78 | HR 97

## 2023-02-18 DIAGNOSIS — G4710 Hypersomnia, unspecified: Secondary | ICD-10-CM | POA: Diagnosis not present

## 2023-02-18 DIAGNOSIS — F900 Attention-deficit hyperactivity disorder, predominantly inattentive type: Secondary | ICD-10-CM | POA: Diagnosis not present

## 2023-02-18 DIAGNOSIS — F422 Mixed obsessional thoughts and acts: Secondary | ICD-10-CM

## 2023-02-18 DIAGNOSIS — R7989 Other specified abnormal findings of blood chemistry: Secondary | ICD-10-CM

## 2023-02-18 DIAGNOSIS — G2581 Restless legs syndrome: Secondary | ICD-10-CM

## 2023-02-18 DIAGNOSIS — F331 Major depressive disorder, recurrent, moderate: Secondary | ICD-10-CM

## 2023-02-18 DIAGNOSIS — F5221 Male erectile disorder: Secondary | ICD-10-CM

## 2023-02-18 DIAGNOSIS — G4733 Obstructive sleep apnea (adult) (pediatric): Secondary | ICD-10-CM

## 2023-02-18 DIAGNOSIS — G4730 Sleep apnea, unspecified: Secondary | ICD-10-CM

## 2023-02-18 DIAGNOSIS — G3184 Mild cognitive impairment, so stated: Secondary | ICD-10-CM

## 2023-02-18 MED ORDER — DULOXETINE HCL 60 MG PO CPEP: 120.0000 mg | ORAL_CAPSULE | Freq: Every day | ORAL | Status: DC

## 2023-02-18 NOTE — Progress Notes (Signed)
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 ropinirole 3 mg pm.  No risperidone. In general trouble with motivation to do things.   Avoiding Ritalin bc concerns about aortic aneurysm.   Trouble dealing with mail and throwing things away.  Piles of things around the house.   Residual OCD issues with light switches.  Stable.  Wife things he obsesses more than he admits.   Sleep 11 hours and often naps.   Sometimes poor sleep. Plan  no changes  01/21/23 appt noted: Worrying too much bc OCD.  Trying to decide about how to deal with a trigger lately.  OCD driving me crazy wanting to make things perfect.   Other than the trigger had a nice time since here.  GS here for 5 days at 73 yo.  Enjoyed that.   Taking semaglutide  down to 250# from 283#. Card at [facility] says he does not have Aortic aneurysm.  Measuring is OK.  Will FU with MRI in June.   Some diarrhea lately. Cannot stop worrying.  Triggered by the plumbing issue. Meds as above.  No SE of sig.   Plan:  switched to duloxetine to 90 mg daily.  Off Lexapro.  Others as noted. Lyrica 100 mg  HS. Has held Ritalin 10 TID, can resume as long as SBP below 140 per card.  none needed ropinirole 3 mg pm.  No risperidone.  02/18/23 appt noted:  with Eric [name] Meds: as above. Taking Ritalin 30% of night.  Prn Xanax rarely if gets off sleep cycle. No SE.   "Terribly dep" but not as bad as in the past. Taking Ritalin appears to help significantly and started doing that.  Tend to stay in bed till noon but pattern of up late.  OCD about the same with some avoidance of detail work.  Afraid I'll throw away something I need.   She doesn't know whether Ritalin helps No sig RLS and wife agrees. Thinks of deceased B at the holidays but worries over son Renae Fickle too.     B schizophrenic SUI. After M's death. PCP Kendra Opitz at  [facility] at 73 years old.  Prior psychiatric medication trials include  Lexapro 20, citalopram NR, clomipramine weight gain, paroxetine, fluoxetine, Luvox, Trintellix,   Increase Lexapro back to 20 mg January 2020. & 10/2020 bupropion,  Auvelity NR  Abilify 10 fatigue,  Cerefolin NAC, and   Naltrexone sexual SE Lyrica 150 tired  pramipexole,  ropinirole Adderall XR & IR sexual SE,  Ritalin 30, Concerta 72 mg AM NR modafinil and Nuvigil,   History [employer] OCD  Review of Systems:  Review of Systems  Constitutional:  Positive for fatigue. Negative for fever.  Cardiovascular:  Negative for chest pain and palpitations.  Gastrointestinal:  Positive for diarrhea.  Genitourinary:        ED  Musculoskeletal:  Positive for arthralgias and myalgias.  Neurological:  Negative for tremors and weakness.  Psychiatric/Behavioral:  Positive for dysphoric mood and sleep disturbance. Negative for agitation, behavioral problems, confusion, decreased concentration, hallucinations, self-injury and suicidal ideas. The patient is nervous/anxious. The patient is not hyperactive.     Medications: I have reviewed the patient's current medications.  Current  Outpatient Medications  Medication Sig Dispense Refill   ALPRAZolam (XANAX) 0.25 MG tablet TAKE 1 TABLET (0.25 MG TOTAL) BY MOUTH 2 (TWO) TIMES DAILY AS NEEDED FOR ANXIETY OR SLEEP. 30 tablet 1   aspirin 81 MG tablet Take 81 mg by mouth daily.     Cholecalciferol (VITAMIN D-3) 5000 units TABS Take 5,000 Units by mouth daily.      ergocalciferol (VITAMIN D2) 1.25 MG (50000 UT) capsule Take 50,000 Units by mouth once a week.     LINZESS 145 MCG CAPS capsule Take 145 mcg by mouth daily before breakfast.     methylphenidate (RITALIN) 10 MG tablet Take 1 tablet (10 mg total) by mouth 3 (three) times daily with meals. 90 tablet 0   naltrexone (DEPADE) 50 MG tablet TAKE 1/2 TABLET BY MOUTH DAILY 45 tablet 1   pregabalin (LYRICA) 100 MG capsule Take 1 capsule (100 mg total) by mouth at bedtime. 90 capsule 3   Semaglutide,0.25 or 0.5MG /DOS, (OZEMPIC, 0.25 OR 0.5 MG/DOSE,) 2 MG/1.5ML SOPN Inject 2.4 mg into the skin once a week.     simvastatin (ZOCOR) 20 MG tablet Take 20 mg by mouth at bedtime.     DULoxetine (CYMBALTA) 60 MG capsule Take 2 capsules (120 mg total) by mouth daily.     risperiDONE (RISPERDAL) 0.5 MG tablet Take 0.5 mg by mouth as needed. PRN (Patient not taking: Reported on 02/18/2023)     rOPINIRole (REQUIP) 1 MG tablet TAKE 3 TABLETS (3 MG TOTAL) BY MOUTH AT BEDTIME. (Patient not taking: Reported on 02/18/2023) 270 tablet 0   No current facility-administered medications for this visit.    Medication Side Effects: None sexual SE are better not  All gone.  Allergies:  Allergies  Allergen Reactions   E.E.S. [Erythromycin] Hives   Macrolides And Ketolides Other (See Comments)    EES    Rosuvastatin     Other reaction(s): cramps    Past Medical History:  Diagnosis Date   Allergy    Anemia    iron- pt denies  Anxiety    Aortic cusp regurgitation    Carotid artery occlusion    Constipation    Coronary artery stenosis    Hyperlipidemia    Lactose intolerance    Major  depression, recurrent, chronic (HCC)    Obesity    OCD (obsessive compulsive disorder)    OSA (obstructive sleep apnea)    Other chronic pain    Periodic limb movement disorder    Periodic limb movements of sleep    Prediabetes    Pure hypercholesterolemia    Restless legs    Sleep apnea    wears cpap    Vitamin D deficiency     Family History  Problem Relation Age of Onset   Cancer Mother        breast and ovarian   Anxiety disorder Mother    Breast cancer Mother    Ovarian cancer Mother    Depression Father        bi-polar   Hyperlipidemia Father    Heart disease Father    Sudden death Father    Bipolar disorder Father    Sleep apnea Father    Obesity Father    Depression Son    Colon cancer Neg Hx    Colon polyps Neg Hx    Esophageal cancer Neg Hx    Rectal cancer Neg Hx    Stomach cancer Neg Hx     Social History   Socioeconomic History   Marital status: Married    Spouse name: Not on file   Number of children: Not on file   Years of education: Not on file   Highest education level: Not on file  Occupational History   Occupation: retired Pensions consultant  Tobacco Use   Smoking status: Never   Smokeless tobacco: Never  Substance and Sexual Activity   Alcohol use: Yes    Comment: occasionally    Drug use: No   Sexual activity: Not on file  Other Topics Concern   Not on file  Social History Narrative   Not on file   Social Drivers of Health   Financial Resource Strain: Not on file  Food Insecurity: No Food Insecurity (01/25/2022)   [facility] to [facility] visits prior to 03/24/2022., [facility]   Hunger Vital Sign    Worried About Running Out of Food in the Last Year: Never true    Ran Out of Food in the Last Year: Never true  Transportation Needs: No Transportation Needs (01/25/2022)   Received from [facility] [employer], [facility] visits  prior to 03/24/2022., [facility] visits prior to 03/24/2022., [facility]   PRAPARE - Transportation    Lack of Transportation (Medical): No    Lack of Transportation (Non-Medical): No  Physical Activity: Not on file  Stress: Not on file  Social Connections: Not on file  Intimate Partner Violence: Not on file    Past Medical History, Surgical history, Social history, and Family history were reviewed and updated as appropriate.   Please see review of systems for further details on the patient's review from today.   Objective:   Physical Exam:  BP 134/78   Pulse 97   Physical Exam Constitutional:      General: He is not in acute distress.    Appearance: He is obese.  Musculoskeletal:        General: No deformity.  Neurological:  Mental Status: He is alert and oriented to person, place, and time.     Cranial Nerves: No dysarthria.     Coordination: Coordination normal.  Psychiatric:        Attention and Perception: Attention and perception normal. He does not perceive auditory or visual hallucinations.        Mood and Affect: Mood is anxious and depressed. Affect is not labile, blunt, angry or inappropriate.        Speech: Speech normal.        Behavior: Behavior normal. Behavior is cooperative.        Thought Content: Thought content normal. Thought content is not paranoid or delusional. Thought content does not include homicidal or suicidal ideation. Thought content does not include suicidal plan.        Cognition and Memory: Cognition and memory normal.        Judgment: Judgment normal.     Comments: Insight intact Residual depression and fatigue  More obsessing lately.    November 06, 2018: [place] Cog test in office within normal limits MMSE 28/30. Animal fluency 17 . (borderline) Taken as a whole, no indication to pursue neuropsychological testing.  Mini-Mental status exam 28/30 on 10/27/20.  No evidence of dementia.  Lab Review:    Vitamin D level acceptable at 54.5.   Normal B12 and folate and TSH in last couple of years..  Echocardiogram is stable re: AVR over the last 8 years and not likely the cause of lethargy.  .res Assessment: Plan:    [place] was seen today for follow-up, depression, anxiety, fatigue and add.  Diagnoses and all orders for this visit:  Major depressive disorder, recurrent episode, moderate (HCC) -     DULoxetine (CYMBALTA) 60 MG capsule; Take 2 capsules (120 mg total) by mouth daily.  Mixed obsessional thoughts and acts -     DULoxetine (CYMBALTA) 60 MG capsule; Take 2 capsules (120 mg total) by mouth daily.  Attention deficit hyperactivity disorder (ADHD), predominantly inattentive type  Hypersomnia with sleep apnea  Restless legs syndrome  Low vitamin D level  Mild cognitive impairment  Obstructive sleep apnea  Erectile disorder, acquired, generalized, moderate   30 min face to face time with patient was spent on counseling and coordination of care. We discussed Mr. Fleagle has a long history of depression and OCD which are partially controlled.  He requires frequent follow-up and his request because of significant residual depression and anxiety and concerns about polypharmacy and he wants regular therapeutic advice on how to further reduce his OCD.  He believes the depression is a consequence of the residual OCD.  He has some compulsive checking and obsessions around the house maintenance.  He wishes to avoid sexual side effects and so we are keeping the SSRI at the lowest possible dose.  He is tried all of the reasonable SSRI options with the exception possibly of sertraline but it is likely to have more sexual dysfunction than what he is taking now.  When travels then tends to have less OCD bc triggered less.    Was better energy with duloxetine 90 vs Lexapro so increase to 120 mg daily DT worsening depression.      His OCD is persistent with checking lites, stove, etc.  .  It is worse at the moment DT a trigger involving his home.  Disc CBT techniques and potential for more therapy to address. Option Dr. Farrel Demark.  Overall his level of depression is mild to moderate with poor  motivation to do things which bothers his wife.  Supportive therapy in solving this.Marland Kitchen  He is able to find things that he enjoys and he does have interests but they are reduced below normal for him.Marland Kitchen  He improved activity levels generally.  And He feels that Ritalin is helpful for energy, productivity, focus and attention.  Fearful about potential BP effects.  Suggest talk with card about that. He wants to stay at the lower dose Ritalin 30 mg AM to protect sleep. Bc delayed sleep phase.   He wants to use it for depression.  Discussed potential benefits, risks, and side effects of stimulants with patient to include increased heart rate, palpitations, insomnia, increased anxiety, increased irritability, or decreased appetite.  Instructed patient to contact office if experiencing any significant tolerability issues. Disc risk of increasing the Ritalin further elevating BP and pulse if we increase the Ritalin.  Need to verify that it's not markedly elevated from taking the Ritalin. Doesn't think it's been consistently elevated. Disc crash risk.  He doesn't need feel it causes crash.  Disc difference between different types of cognitive difficulties such as Alzheimer's disease for which there is no evidence and ADD related inattentiveness which can contribute to some of the cognitive problems that his wife identifies.  He may also have longstanding underlying ADD.  He has used Ritalin but it is inherently short acting and he is only taking it once a day.    Extensive discussion of sleep study and he has a copy.  Whys is there virtually no N3 & REM sleep?  Is that affecting daytime alertness and fatigue?  Vs how much is related to mild OSA and PLMS?  Disc this in detail.  Wrote coreespondence with sleep  doc about it.  Options for sleep & fatigue:  Difficult to assess  bc [place] and then sick for 3 weeks.   Focus on reduction in OSA Focus on improving deep stage sleep.  ? Rx low dose mirtazapine or alternatives Focus on leg movements (hx RLS/PLMS) continue Trial as had been suggested Lyrica for FM type sx and may help RLS/PLMS.  Better dreaming on 100 mg HS and too sleepy on 150.  Decrease  to 100 mg HS for sleep and back pain and RLS No ropinirole needed.   Disc SE.  Not having any.   Use LED Xanax and try to avoid BZ daytime bc fatigue.  He doesn't use it much daythime.  Using 0..25-0.5 mg at night.  May need less with more Lyrica at [facility] and try to minimize.  No hangover. We discussed the short-term risks associated with benzodiazepines including sedation and increased fall risk among others.  Discussed long-term side effect risk including dependence, potential withdrawal symptoms, and the potential eventual dose-related risk of dementia.  But recent studies from 2020 dispute this association between benzodiazepines and dementia risk. Newer studies in 2020 do not support an association with dementia.  Thinks memory problem relate to OCD bc often counting in the in the background at rest which tends to reduce his attention.  Ritalin is being successfully used off label to augment antidepressants for depression and have resulted in improved productivity and attention.  Previous screening of memory was not suggestive of any neuro degenerative process. Mini-Mental status exam 28/30 on 10/27/20.  No evidence of dementia.  He has lost sig weight on Ozempic. Disc use of this for weight loss.  This will help a number of things including joints.  Option sildenafil .  He wishes to defer.  No problem with the reduction in ropinirole. RLS managed.  Still no complaints. Disc also dx PLMS.  Wife denies noticing it lately.  Supportive therapy and problem solving around distinguishing  decision from OCD.  [place] getting back to exercise.  Disc naltrexone and wt loss given his lack of sufficient repsonse with Ozempic 2mg  weekly.  Disc dosing.  He is finding it helpful.   Plan: Meds:  Increase duloxetine to 120 mg daily.  Off Lexapro.  O thers as noted. Lyrica 100 mg HS.  resumed Ritalin 30 AM with some benefit. no risperidone.  Started seeing [employer] [facility] counseling again.  Follow-up 4 weeks per pt request  Meredith Staggers MD, DFAPA.  Please see After Visit Summary for patient specific instructions.  Future Appointments  Date Time Provider Department Center  03/19/2023  2:00 PM Cottle, Steva Ready., MD [facility] None  04/16/2023  1:00 PM Cottle, Steva Ready., MD [facility] None  05/21/2023  1:00 PM Cottle, Steva Ready., MD [facility] None  06/18/2023  2:00 PM Cottle, Steva Ready., MD [facility] None  07/16/2023  2:00 PM Cottle, Steva Ready., MD [facility] None     No orders of the defined types were placed in this encounter.      -------------------------------

## 2023-02-23 ENCOUNTER — Other Ambulatory Visit: Payer: Self-pay | Admitting: Psychiatry

## 2023-02-23 DIAGNOSIS — F422 Mixed obsessional thoughts and acts: Secondary | ICD-10-CM

## 2023-02-23 DIAGNOSIS — F331 Major depressive disorder, recurrent, moderate: Secondary | ICD-10-CM

## 2023-02-25 NOTE — Telephone Encounter (Signed)
Dose increase 1/27

## 2023-02-27 DIAGNOSIS — I779 Disorder of arteries and arterioles, unspecified: Secondary | ICD-10-CM | POA: Diagnosis not present

## 2023-02-27 DIAGNOSIS — E7800 Pure hypercholesterolemia, unspecified: Secondary | ICD-10-CM | POA: Diagnosis not present

## 2023-02-27 DIAGNOSIS — E66812 Obesity, class 2: Secondary | ICD-10-CM | POA: Diagnosis not present

## 2023-02-27 DIAGNOSIS — R7303 Prediabetes: Secondary | ICD-10-CM | POA: Diagnosis not present

## 2023-02-27 DIAGNOSIS — E6609 Other obesity due to excess calories: Secondary | ICD-10-CM | POA: Diagnosis not present

## 2023-02-27 DIAGNOSIS — Z6835 Body mass index (BMI) 35.0-35.9, adult: Secondary | ICD-10-CM | POA: Diagnosis not present

## 2023-03-09 DIAGNOSIS — Z03818 Encounter for observation for suspected exposure to other biological agents ruled out: Secondary | ICD-10-CM | POA: Diagnosis not present

## 2023-03-09 DIAGNOSIS — J988 Other specified respiratory disorders: Secondary | ICD-10-CM | POA: Diagnosis not present

## 2023-03-09 DIAGNOSIS — R051 Acute cough: Secondary | ICD-10-CM | POA: Diagnosis not present

## 2023-03-09 DIAGNOSIS — J029 Acute pharyngitis, unspecified: Secondary | ICD-10-CM | POA: Diagnosis not present

## 2023-03-15 DIAGNOSIS — H524 Presbyopia: Secondary | ICD-10-CM | POA: Diagnosis not present

## 2023-03-15 DIAGNOSIS — H5213 Myopia, bilateral: Secondary | ICD-10-CM | POA: Diagnosis not present

## 2023-03-15 DIAGNOSIS — H52203 Unspecified astigmatism, bilateral: Secondary | ICD-10-CM | POA: Diagnosis not present

## 2023-03-15 DIAGNOSIS — H2513 Age-related nuclear cataract, bilateral: Secondary | ICD-10-CM | POA: Diagnosis not present

## 2023-03-15 DIAGNOSIS — D3131 Benign neoplasm of right choroid: Secondary | ICD-10-CM | POA: Diagnosis not present

## 2023-03-19 ENCOUNTER — Ambulatory Visit: Payer: Medicare Other | Admitting: Psychiatry

## 2023-03-19 ENCOUNTER — Other Ambulatory Visit: Payer: Self-pay | Admitting: Psychiatry

## 2023-03-19 ENCOUNTER — Encounter: Payer: Self-pay | Admitting: Psychiatry

## 2023-03-19 ENCOUNTER — Telehealth: Payer: Self-pay | Admitting: Psychiatry

## 2023-03-19 DIAGNOSIS — F900 Attention-deficit hyperactivity disorder, predominantly inattentive type: Secondary | ICD-10-CM

## 2023-03-19 DIAGNOSIS — G3184 Mild cognitive impairment, so stated: Secondary | ICD-10-CM

## 2023-03-19 DIAGNOSIS — G4730 Sleep apnea, unspecified: Secondary | ICD-10-CM

## 2023-03-19 DIAGNOSIS — F422 Mixed obsessional thoughts and acts: Secondary | ICD-10-CM

## 2023-03-19 DIAGNOSIS — R7989 Other specified abnormal findings of blood chemistry: Secondary | ICD-10-CM

## 2023-03-19 DIAGNOSIS — G4710 Hypersomnia, unspecified: Secondary | ICD-10-CM | POA: Diagnosis not present

## 2023-03-19 DIAGNOSIS — F5221 Male erectile disorder: Secondary | ICD-10-CM

## 2023-03-19 DIAGNOSIS — F331 Major depressive disorder, recurrent, moderate: Secondary | ICD-10-CM | POA: Diagnosis not present

## 2023-03-19 DIAGNOSIS — G4733 Obstructive sleep apnea (adult) (pediatric): Secondary | ICD-10-CM

## 2023-03-19 DIAGNOSIS — G2581 Restless legs syndrome: Secondary | ICD-10-CM

## 2023-03-19 MED ORDER — NALTREXONE HCL 50 MG PO TABS: 25.0000 mg | ORAL_TABLET | Freq: Every day | ORAL | 1 refills | Status: DC

## 2023-03-19 MED ORDER — DULOXETINE HCL 60 MG PO CPEP: 120.0000 mg | ORAL_CAPSULE | Freq: Every day | ORAL | 0 refills | Status: DC

## 2023-03-19 MED ORDER — ARIPIPRAZOLE 2 MG PO TABS
2.0000 mg | ORAL_TABLET | Freq: Every day | ORAL | 1 refills | Status: DC
Start: 2023-03-19 — End: 2023-04-12

## 2023-03-19 NOTE — Progress Notes (Signed)
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  OCD driving me crazy wanting to make things perfect.   Other than the trigger had a nice time since here.  GS here for 5 days at 73 yo.  Enjoyed that.   Taking semaglutide  down to 250# from 283#. Card at [facility] says he does not have Aortic aneurysm.  Measuring is OK.  Will FU with MRI in June.   Some diarrhea lately. Cannot stop worrying.  Triggered by the plumbing issue. Meds as above.  No SE of sig.   Plan:  switched to duloxetine to 90 mg daily.  Off Lexapro.  Others as noted. Lyrica 100 mg HS. Has held Ritalin 10 TID, can resume as long as SBP below 140 per card.  none needed ropinirole 3 mg pm.  No risperidone.  02/18/23 appt noted:  with [name] Meds: as above. Taking Ritalin 30% of night.  Prn Xanax rarely if gets off sleep cycle. No SE.   "Terribly dep" but not as bad as in the past. Taking Ritalin appears to help significantly and started doing that.  Tend to stay in bed till noon but pattern of up late.   OCD about the same with some avoidance of detail work.   Afraid I'll throw away something I need.   She doesn't know whether Ritalin helps No sig RLS and wife agrees. Thinks of deceased B at the holidays but worries over son Renae Fickle too.   Plan: Increase duloxetine to 120 mg daily.  Off Lexapro.  Others as noted. Lyrica 100 mg HS.  resumed Ritalin 30 AM with some benefit. no  risperidone.  03/19/23 appt noted: Psych med:  duloxetine 120, Ritalin 20 am  missing doses, Lyrica 100 HS, no risperidone, no ropinirole.   No improvement in depression since increase dose duloxetine.  More dep than usual.  Worrying about everything.  Including taxes.  All I can see is a black hole.  Making him feel negative about everything.   Keeping him from doing things.   OCD is not much different from when on Lexapro.  Wife agrees dep and distracted.  B schizophrenic SUI. After M's death. PCP Kendra Opitz at [facility] at 73 years old.  Prior psychiatric medication trials include  Lexapro 20, citalopram NR, clomipramine weight gain, paroxetine, fluoxetine, Luvox, Trintellix,   Increase Lexapro back to 20 mg January 2020. & 10/2020 bupropion,  Auvelity NR  Abilify 10 fatigue,  Cerefolin NAC, and   Naltrexone sexual SE Lyrica 150 tired  pramipexole,  ropinirole Adderall XR & IR sexual SE,  Ritalin 30, Concerta 72 mg AM NR modafinil and Nuvigil,   History [employer] OCD  Review of Systems:  Review of Systems  Constitutional:  Positive for fatigue. Negative for fever.  Cardiovascular:  Negative for chest pain and palpitations.  Gastrointestinal:  Positive for diarrhea.  Genitourinary:        ED  Musculoskeletal:  Positive for arthralgias and myalgias.  Neurological:  Negative for tremors and weakness.  Psychiatric/Behavioral:  Positive for dysphoric mood and sleep disturbance. Negative for agitation, behavioral problems, confusion, decreased concentration, hallucinations, self-injury and suicidal ideas. The patient is  nervous/anxious. The patient is not hyperactive.     Medications: I have reviewed the patient's current medications.  Current Outpatient Medications  Medication Sig Dispense Refill   ALPRAZolam (XANAX) 0.25 MG tablet TAKE 1 TABLET (0.25 MG TOTAL) BY MOUTH 2 (TWO) TIMES DAILY AS NEEDED FOR ANXIETY OR SLEEP. 30 tablet 1   ARIPiprazole (ABILIFY) 2 MG tablet Take 1 tablet (2 mg total) by mouth daily. 30 tablet 1   aspirin 81 MG tablet Take 81 mg by mouth daily.     Cholecalciferol (VITAMIN D-3) 5000 units TABS Take 5,000 Units by mouth daily.      ergocalciferol (VITAMIN D2) 1.25 MG (50000 UT) capsule Take 50,000 Units by mouth once a week.     LINZESS 145 MCG CAPS capsule Take 145 mcg by mouth daily before breakfast.     methylphenidate (RITALIN) 10 MG tablet Take 1 tablet (10 mg total) by mouth 3 (three) times daily with meals. 90 tablet 0   naltrexone (DEPADE) 50 MG tablet TAKE 1/2 TABLET BY MOUTH DAILY 45 tablet 1   pregabalin (LYRICA) 100 MG capsule Take 1 capsule (100 mg total) by mouth at bedtime. 90 capsule 3   Semaglutide,0.25 or 0.5MG /DOS, (OZEMPIC, 0.25 OR 0.5 MG/DOSE,) 2 MG/1.5ML SOPN Inject 2.4 mg into the skin once a week.     simvastatin (ZOCOR) 20 MG tablet Take 20 mg by mouth at bedtime.     DULoxetine (CYMBALTA) 60 MG capsule Take 2 capsules (120 mg total) by mouth daily. 180 capsule 0   risperiDONE (RISPERDAL) 0.5 MG tablet Take 0.5 mg by mouth as needed. PRN (Patient not taking: Reported on 03/19/2023)     rOPINIRole (REQUIP) 1 MG tablet TAKE 3 TABLETS (3 MG TOTAL) BY MOUTH AT BEDTIME. (Patient not taking: Reported on 03/19/2023) 270 tablet 0   No current facility-administered medications for this visit.    Medication Side Effects: None sexual SE are better not  All gone.  Allergies:  Allergies  Allergen Reactions   E.E.S. [Erythromycin] Hives   Macrolides And Ketolides Other (See Comments)    EES    Rosuvastatin     Other reaction(s): cramps    Past Medical  History:  Diagnosis Date   Allergy    Anemia    iron- pt denies    Anxiety    Aortic cusp regurgitation    Carotid artery occlusion    Constipation    Coronary artery stenosis    Hyperlipidemia    Lactose intolerance    Major depression, recurrent, chronic (HCC)    Obesity    OCD (obsessive compulsive disorder)    OSA (obstructive sleep apnea)    Other chronic pain    Periodic limb movement disorder    Periodic limb movements of sleep    Prediabetes    Pure hypercholesterolemia    Restless legs    Sleep apnea    wears cpap    Vitamin D deficiency     Family History  Problem Relation Age of Onset   Cancer Mother        breast and ovarian   Anxiety disorder Mother    Breast cancer Mother    Ovarian cancer Mother    Depression Father        bi-polar   Hyperlipidemia Father    Heart disease Father    Sudden death Father    Bipolar disorder Father    Sleep apnea Father    Obesity Father    Depression Son    Colon cancer Neg Hx    Colon polyps Neg Hx    Esophageal cancer Neg Hx    Rectal cancer Neg Hx    Stomach cancer Neg Hx     Social History   Socioeconomic History   Marital status: Married    Spouse name: Not on file   Number of children: Not on file   Years of education: Not on file   Highest education level: Not on file  Occupational History   Occupation: retired Pensions consultant  Tobacco Use   Smoking status: Never   Smokeless tobacco: Never  Substance and Sexual Activity   Alcohol use: Yes    Comment: occasionally    Drug use: No   Sexual activity: Not on file  Other Topics Concern   Not on file  Social History Narrative   Not on file   Social Drivers of Health   Financial Resource Strain: Not on file  Food Insecurity: No Food Insecurity (01/25/2022)   [facility] visits prior to 03/24/2022[facility]   Hunger Vital Sign    Worried  About Running Out of Food in the Last Year: Never true    Ran Out of Food in the Last Year: Never true  Transportation Needs: No Transportation Needs (01/25/2022)   [facility] [employer], [facility] visits prior to 03/24/2022., [facility] visits prior to 03/24/2022., [facility]   PRAPARE - Transportation    Lack of Transportation (Medical): No    Lack of Transportation (Non-Medical): No  Physical Activity: Not on file  Stress: Not on file  Social Connections: Not on file  Intimate Partner Violence: Not on file    Past Medical History, Surgical history, Social history, and Family history were reviewed and updated as appropriate.   Please see review of systems for further details  on the patient's review from today.   Objective:   Physical Exam:  There were no vitals taken for this visit.  Physical Exam Constitutional:      General: He is not in acute distress.    Appearance: He is obese.  Musculoskeletal:        General: No deformity.  Neurological:     Mental Status: He is alert and oriented to person, place, and time.     Cranial Nerves: No dysarthria.     Coordination: Coordination normal.  Psychiatric:        Attention and Perception: Attention and perception normal. He does not perceive auditory or visual hallucinations.        Mood and Affect: Mood is anxious and depressed. Affect is not labile, blunt, angry or inappropriate.        Speech: Speech normal.        Behavior: Behavior normal. Behavior is cooperative.        Thought Content: Thought content normal. Thought content is not paranoid or delusional. Thought content does not include homicidal or suicidal ideation. Thought content does not include suicidal plan.        Cognition and Memory: Cognition and memory normal.        Judgment: Judgment normal.     Comments: Insight intact Residual depression and fatigue  OCD is about the same and not the main problem     November 06, 2018: [place] Cog test in office within normal limits MMSE 28/30. Animal fluency 17 . (borderline) Taken as a whole, no indication to pursue neuropsychological testing.  Mini-Mental status exam 28/30 on 10/27/20.  No evidence of dementia.  Lab Review:   Vitamin D level acceptable at 54.5.   Normal B12 and folate and TSH in last couple of years..  Echocardiogram is stable re: AVR over the last 8 years and not likely the cause of lethargy.  .res Assessment: Plan:    [place] was seen today for follow-up, depression, anxiety and add.  Diagnoses and all orders for this visit:  Major depressive disorder, recurrent episode, moderate (HCC) -     ARIPiprazole (ABILIFY) 2 MG tablet; Take 1 tablet (2 mg total) by mouth daily. -     DULoxetine (CYMBALTA) 60 MG capsule; Take 2 capsules (120 mg total) by mouth daily.  Mixed obsessional thoughts and acts -     DULoxetine (CYMBALTA) 60 MG capsule; Take 2 capsules (120 mg total) by mouth daily.  Attention deficit hyperactivity disorder (ADHD), predominantly inattentive type  Hypersomnia with sleep apnea  Restless legs syndrome  Low vitamin D level  Mild cognitive impairment  Obstructive sleep apnea  Erectile disorder, acquired, generalized, moderate    30 min face to face time with patient was spent on counseling and coordination of care. We discussed Mr. Clayson has a long history of depression and OCD which are partially controlled.  He requires frequent follow-up and his request because of significant residual depression and anxiety and concerns about polypharmacy and he wants regular therapeutic advice on how to further reduce his OCD.  He believes the depression is a consequence of the residual OCD.  He has some compulsive checking and obsessions around the house maintenance.  He wishes to avoid sexual side effects and so we are keeping the SSRI at the lowest possible dose.  He is tried all of the reasonable SSRI  options with the exception possibly of sertraline but it is likely to have more sexual dysfunction than what he  is taking now.  When travels then tends to have less OCD bc triggered less.    Was better energy with duloxetine 90 vs Lexapro so increase to 120 mg daily DT worsening depression.      His OCD is persistent with checking lites, stove, etc. .  It is worse at the moment DT a trigger involving his home.  Disc CBT techniques and potential for more therapy to address. Option Dr. Farrel Demark.  Overall his level of depression is mild to moderate with poor motivation to do things which bothers his wife.  Supportive therapy in solving this.Marland Kitchen  He is able to find things that he enjoys and he does have interests but they are reduced below normal for him.Marland Kitchen  He improved activity levels generally.  And He feels that Ritalin is helpful for energy, productivity, focus and attention.  Fearful about potential BP effects.  Suggest talk with card about that. He wants to stay at the lower dose Ritalin 30 mg AM to protect sleep. Bc delayed sleep phase.   He wants to use it for depression.  Discussed potential benefits, risks, and side effects of stimulants with patient to include increased heart rate, palpitations, insomnia, increased anxiety, increased irritability, or decreased appetite.  Instructed patient to contact office if experiencing any significant tolerability issues. Disc risk of increasing the Ritalin further elevating BP and pulse if we increase the Ritalin.  Need to verify that it's not markedly elevated from taking the Ritalin. Doesn't think it's been consistently elevated. Disc crash risk.  He doesn't need feel it causes crash.  Disc difference between different types of cognitive difficulties such as Alzheimer's disease for which there is no evidence and ADD related inattentiveness which can contribute to some of the cognitive problems that his wife identifies.  He may also have longstanding  underlying ADD.  He has used Ritalin but it is inherently short acting and he is only taking it once a day.    Extensive discussion of sleep study and he has a copy.  Whys is there virtually no N3 & REM sleep?  Is that affecting daytime alertness and fatigue?  Vs how much is related to mild OSA and PLMS?  Disc this in detail.  Wrote coreespondence with sleep doc about it.  Options for sleep & fatigue:  Difficult to assess  bc has been [place] of [place] and then sick for 3 weeks.   Focus on reduction in OSA Focus on improving deep stage sleep.  ? Rx low dose mirtazapine or alternatives Focus on leg movements (hx RLS/PLMS) continue Trial as had been suggested Lyrica for FM type sx and may help RLS/PLMS.  Better dreaming on 100 mg HS and too sleepy on 150.  Decrease  to 100 mg HS for sleep and back pain and RLS No ropinirole needed.   Disc SE.  Not having any.   Use LED Xanax and try to avoid BZ daytime bc fatigue.  He doesn't use it much daythime.  Using 0..25-0.5 mg at night.  May need less with more Lyrica at [facility] and try to minimize.  No hangover. We discussed the short-term risks associated with benzodiazepines including sedation and increased fall risk among others.  Discussed long-term side effect risk including dependence, potential withdrawal symptoms, and the potential eventual dose-related risk of dementia.  But recent studies from 2020 dispute this association between benzodiazepines and dementia risk. Newer studies in 2020 do not support an association with dementia.  Thinks memory problem relate  to OCD bc often counting in the in the background at rest which tends to reduce his attention.  Ritalin is being successfully used off label to augment antidepressants for depression and have resulted in improved productivity and attention.  Previous screening of memory was not suggestive of any neuro degenerative process. Mini-Mental status exam 28/30 on 10/27/20.  No evidence of dementia.  He has  lost sig weight on Ozempic. Disc use of this for weight loss.  This will help a number of things including joints.   Option sildenafil .  He wishes to defer.  No problem with the reduction in ropinirole. RLS managed.  Still no complaints. Disc also dx PLMS.  Wife denies noticing it lately.  Supportive therapy and problem solving around distinguishing decision from OCD.  [place] getting back to exercise.  Disc naltrexone and wt loss given his lack of sufficient repsonse with Ozempic 2mg  weekly.  Disc dosing.  He is finding it helpful.   Discussed potential metabolic side effects associated with atypical antipsychotics, as well as potential risk for movement side effects. Advised pt to contact office if movement side effects occur.   Plan: Meds:  continue duloxetine to 120 mg daily.  Off Lexapro.  Lyrica 100 mg HS.  resumed Ritalin 30 AM with some benefit. Resume Abilify 2 mg AM for dep.  Started seeing [employer] counseling again.  Follow-up 4 weeks per pt request  Meredith Staggers MD, DFAPA.  Please see After Visit Summary for patient specific instructions.  Future Appointments  Date Time Provider Department Center  04/16/2023  1:00 PM Cottle, Steva Ready., MD [facility] None  05/21/2023  1:00 PM Cottle, Steva Ready., MD [facility] None  06/18/2023  2:00 PM Cottle, Steva Ready., MD [facility] None  07/16/2023  2:00 PM Cottle, Steva Ready., MD [facility] None     No orders of the defined types were placed in this encounter.      -------------------------------

## 2023-03-19 NOTE — Telephone Encounter (Signed)
 Pt called back after his visit with Dr Jennelle Human today at 2p.  He went to pick up the Abilify and found out it needs a PA.  He was at the [facility] on [address] in [place].  Pls start PA paperwork asap.  Next appt 3/25

## 2023-03-20 NOTE — Telephone Encounter (Signed)
 Pt called today at 1:17p requesting status on PA.  He wants to know how long the process is for him to get his meds approved.

## 2023-03-21 ENCOUNTER — Telehealth: Payer: Self-pay | Admitting: Psychiatry

## 2023-03-21 NOTE — Telephone Encounter (Signed)
 Prior authorization Aripiprazole 2 mg #30 for 30 day with [employer], pending response

## 2023-03-21 NOTE — Telephone Encounter (Signed)
 Called patient back and let him know PA approved for Abilify.

## 2023-03-21 NOTE — Telephone Encounter (Signed)
 LVM to [facility] to let him know that PA for Abilify had been approved.

## 2023-03-21 NOTE — Telephone Encounter (Signed)
 Prior Approval received for Aripiprazole 2 mg #30 effective 03/20/2024 with [employer] Medicare.

## 2023-04-02 DIAGNOSIS — E7800 Pure hypercholesterolemia, unspecified: Secondary | ICD-10-CM | POA: Diagnosis not present

## 2023-04-02 DIAGNOSIS — R7303 Prediabetes: Secondary | ICD-10-CM | POA: Diagnosis not present

## 2023-04-02 DIAGNOSIS — G4733 Obstructive sleep apnea (adult) (pediatric): Secondary | ICD-10-CM | POA: Diagnosis not present

## 2023-04-02 DIAGNOSIS — F909 Attention-deficit hyperactivity disorder, unspecified type: Secondary | ICD-10-CM | POA: Diagnosis not present

## 2023-04-02 DIAGNOSIS — Z6835 Body mass index (BMI) 35.0-35.9, adult: Secondary | ICD-10-CM | POA: Diagnosis not present

## 2023-04-09 DIAGNOSIS — Q2381 Bicuspid aortic valve: Secondary | ICD-10-CM | POA: Diagnosis not present

## 2023-04-09 DIAGNOSIS — I351 Nonrheumatic aortic (valve) insufficiency: Secondary | ICD-10-CM | POA: Diagnosis not present

## 2023-04-12 ENCOUNTER — Other Ambulatory Visit: Payer: Self-pay | Admitting: Psychiatry

## 2023-04-12 DIAGNOSIS — F331 Major depressive disorder, recurrent, moderate: Secondary | ICD-10-CM

## 2023-04-16 ENCOUNTER — Encounter: Payer: Self-pay | Admitting: Psychiatry

## 2023-04-16 ENCOUNTER — Ambulatory Visit: Payer: Medicare Other | Admitting: Psychiatry

## 2023-04-16 VITALS — BP 129/76 | HR 88

## 2023-04-16 DIAGNOSIS — G4733 Obstructive sleep apnea (adult) (pediatric): Secondary | ICD-10-CM

## 2023-04-16 DIAGNOSIS — F422 Mixed obsessional thoughts and acts: Secondary | ICD-10-CM

## 2023-04-16 DIAGNOSIS — G4710 Hypersomnia, unspecified: Secondary | ICD-10-CM | POA: Diagnosis not present

## 2023-04-16 DIAGNOSIS — F900 Attention-deficit hyperactivity disorder, predominantly inattentive type: Secondary | ICD-10-CM

## 2023-04-16 DIAGNOSIS — G3184 Mild cognitive impairment, so stated: Secondary | ICD-10-CM

## 2023-04-16 DIAGNOSIS — F331 Major depressive disorder, recurrent, moderate: Secondary | ICD-10-CM | POA: Diagnosis not present

## 2023-04-16 DIAGNOSIS — R7989 Other specified abnormal findings of blood chemistry: Secondary | ICD-10-CM

## 2023-04-16 DIAGNOSIS — F5221 Male erectile disorder: Secondary | ICD-10-CM

## 2023-04-16 DIAGNOSIS — G2581 Restless legs syndrome: Secondary | ICD-10-CM

## 2023-04-16 DIAGNOSIS — G4730 Sleep apnea, unspecified: Secondary | ICD-10-CM

## 2023-04-16 MED ORDER — METHYLPHENIDATE HCL ER (OSM) 36 MG PO TBCR: 36.0000 mg | EXTENDED_RELEASE_TABLET | Freq: Every day | ORAL | 0 refills | Status: DC

## 2023-04-16 MED ORDER — CARIPRAZINE HCL 1.5 MG PO CAPS: 1.5000 mg | ORAL_CAPSULE | ORAL | Status: DC

## 2023-04-16 NOTE — Progress Notes (Signed)
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 driving me crazy wanting to make things perfect.   Other than the trigger had a nice time since here.  GS here for 5 days at 73 yo.  Enjoyed that.   Taking semaglutide  down to 250# from 283#. Card at [facility] says he does not have Aortic aneurysm.  Measuring is OK.  Will FU with MRI in June.   Some diarrhea lately. Cannot stop worrying.  Triggered by the plumbing issue. Meds as above.  No SE of sig.   Plan:  switched to duloxetine to 90 mg daily.  Off Lexapro.  Others as noted. Lyrica 100 mg HS. Has held Ritalin 10 TID, can resume as long as SBP below 140 per card.  none needed ropinirole 3 mg pm.  No risperidone.  02/18/23 appt noted:  with Eric [name] Meds: as above. Taking Ritalin 30% of night.  Prn Xanax rarely if gets off sleep cycle. No SE.   "Terribly dep" but not as bad as in the past. Taking Ritalin appears to help significantly and started doing that.  Tend to stay in bed till noon but pattern of up late.   OCD about the same with some avoidance of detail work.   Afraid I'll throw away something I need.   She doesn't know whether Ritalin helps No sig RLS and wife agrees. Thinks of deceased B at the holidays but worries over son Renae Fickle too.   Plan: Increase duloxetine to 120 mg daily.  Off Lexapro.  Others as noted. Lyrica 100 mg HS.  resumed Ritalin 30 AM  with some benefit. no risperidone.  03/19/23 appt noted: Psych med:  duloxetine 120, Ritalin 20 am  missing doses, Lyrica 100 HS, no risperidone, no ropinirole.   No improvement in depression since increase dose duloxetine.  More dep than usual.  Worrying about everything.  Including taxes.  All I can see is a black hole.  Making him feel negative about everything.   Keeping him from doing things.   OCD is not much different from when on Lexapro.  Wife agrees dep and distracted. Plan: resumed Ritalin 30 AM with some benefit. Resume Abilify 2 mg AM for dep.  04/16/23 appt noted:  wife here Psych med:  duloxetine 120, resumed Concerta 36 mg AM, Lyrica 100 HS, Abilify 2, no ropinirole.   Wife noted he seemed manic yesterday.  Invited window estimate against wife's will and without her input.   No mania noted until yesterday. He didn't feel manic yesterday.   He's noticed no change with Abilify and still feels flat and a little low.  From [facility] energy and mood a little better.  Sleeps until 10-11 am about 12 hours. And asleep that whole time. Wife says he doesn't eat much bc he's not awake much. Trouble with hygiene.    ECT-MADRS    Flowsheet Row Office Visit from 04/16/2023 in [facility]  MADRS Total Score 36      PHQ2-9    Flowsheet Row Office Visit from 03/15/2020 in [facility] Total Score 3  PHQ-9 Total Score 9        B schizophrenic SUI. After M's death. PCP Kendra Opitz at [facility] at 73 years old.  Prior psychiatric medication trials include  Lexapro 20, citalopram NR,  clomipramine weight gain, paroxetine, fluoxetine, Luvox, Trintellix,   Increase Lexapro back to 20 mg January 2020. & 10/2020 bupropion,  Auvelity NR  Abilify 10 fatigue,  Cerefolin NAC, and   Naltrexone sexual SE Lyrica 150 tired  pramipexole,  ropinirole Adderall XR & IR sexual SE,  Ritalin 30, Concerta 72 mg AM NR modafinil and Nuvigil,   History [employer] OCD  Review of Systems:  Review of Systems  Constitutional:  Positive for fatigue. Negative for fever.  Cardiovascular:  Negative for chest pain and palpitations.  Gastrointestinal:  Positive for diarrhea.  Genitourinary:        ED  Musculoskeletal:  Positive for arthralgias and myalgias.  Neurological:  Negative for tremors and weakness.  Psychiatric/Behavioral:  Positive for dysphoric mood and sleep disturbance. Negative for agitation, behavioral problems, confusion, decreased concentration, hallucinations, self-injury and suicidal ideas. The patient is nervous/anxious. The patient is not hyperactive.     Medications: I have reviewed the patient's current medications.  Current Outpatient Medications  Medication Sig Dispense Refill   ALPRAZolam (XANAX) 0.25 MG tablet TAKE 1 TABLET (0.25 MG TOTAL) BY MOUTH 2 (TWO) TIMES DAILY AS NEEDED FOR ANXIETY OR SLEEP. 30 tablet 1   ARIPiprazole (ABILIFY) 2 MG tablet TAKE 1 TABLET BY MOUTH EVERY DAY 90 tablet 0   aspirin 81 MG tablet Take 81 mg by mouth daily.     cariprazine (VRAYLAR) 1.5 MG capsule Take 1 capsule (1.5 mg total) by mouth every other day.     Cholecalciferol (VITAMIN D-3) 5000 units TABS Take 5,000 Units by mouth daily.      DULoxetine (CYMBALTA) 60 MG capsule Take 2 capsules (120 mg total) by mouth daily. 180 capsule 0  ergocalciferol (VITAMIN D2) 1.25 MG (50000 UT) capsule Take 50,000 Units by mouth once a week.     LINZESS 145 MCG CAPS capsule Take 145 mcg by mouth daily before breakfast.     naltrexone (DEPADE) 50 MG tablet Take 0.5 tablets (25 mg total) by  mouth daily. 45 tablet 1   pregabalin (LYRICA) 100 MG capsule Take 1 capsule (100 mg total) by mouth at bedtime. 90 capsule 3   rOPINIRole (REQUIP) 1 MG tablet TAKE 3 TABLETS (3 MG TOTAL) BY MOUTH AT BEDTIME. 270 tablet 0   Semaglutide,0.25 or 0.5MG /DOS, (OZEMPIC, 0.25 OR 0.5 MG/DOSE,) 2 MG/1.5ML SOPN Inject 2.4 mg into the skin once a week.     simvastatin (ZOCOR) 20 MG tablet Take 20 mg by mouth at bedtime.     methylphenidate 36 MG PO CR tablet Take 1 tablet (36 mg total) by mouth daily. 30 tablet 0   risperiDONE (RISPERDAL) 0.5 MG tablet Take 0.5 mg by mouth as needed. PRN (Patient not taking: Reported on 04/16/2023)     No current facility-administered medications for this visit.    Medication Side Effects: None sexual SE are better not  All gone.  Allergies:  Allergies  Allergen Reactions   E.E.S. [Erythromycin] Hives   Macrolides And Ketolides Other (See Comments)    EES    Rosuvastatin     Other reaction(s): cramps    Past Medical History:  Diagnosis Date   Allergy    Anemia    iron- pt denies    Anxiety    Aortic cusp regurgitation    Carotid artery occlusion    Constipation    Coronary artery stenosis    Hyperlipidemia    Lactose intolerance    Major depression, recurrent, chronic (HCC)    Obesity    OCD (obsessive compulsive disorder)    OSA (obstructive sleep apnea)    Other chronic pain    Periodic limb movement disorder    Periodic limb movements of sleep    Prediabetes    Pure hypercholesterolemia    Restless legs    Sleep apnea    wears cpap    Vitamin D deficiency     Family History  Problem Relation Age of Onset   Cancer Mother        breast and ovarian   Anxiety disorder Mother    Breast cancer Mother    Ovarian cancer Mother    Depression Father        bi-polar   Hyperlipidemia Father    Heart disease Father    Sudden death Father    Bipolar disorder Father    Sleep apnea Father    Obesity Father    Depression Son    Colon cancer  Neg Hx    Colon polyps Neg Hx    Esophageal cancer Neg Hx    Rectal cancer Neg Hx    Stomach cancer Neg Hx     Social History   Socioeconomic History   Marital status: Married    Spouse name: Not on file   Number of children: Not on file   Years of education: Not on file   Highest education level: Not on file  Occupational History   Occupation: retired Pensions consultant  Tobacco Use   Smoking status: Never   Smokeless tobacco: Never  Substance and Sexual Activity   Alcohol use: Yes    Comment: occasionally    Drug use: No   Sexual activity: Not on file  Other Topics  Concern   Not on file  Social History Narrative   Not on file   Social Drivers of Health   Financial Resource Strain: Not on file  Food Insecurity: No Food Insecurity (01/25/2022)   [facility] visits prior to 03/24/2022[facility]   Hunger Vital Sign    Worried About Running Out of Food in the Last Year: Never true    Ran Out of Food in the Last Year: Never true  Transportation Needs: No Transportation Needs (01/25/2022)   [facility], [facility] visits prior to 03/24/2022., [facility] visits prior to 03/24/2022., [facility]   PRAPARE - Transportation    Lack of Transportation (Medical): No    Lack of Transportation (Non-Medical): No  Physical Activity: Not on file  Stress: Not on file  Social Connections: Not on file  Intimate Partner Violence: Not on file    Past Medical History, Surgical history, Social history, and Family history were reviewed and updated as appropriate.   Please see review of systems for further details on the patient's review from today.   Objective:   Physical Exam:  BP 129/76   Pulse 88   Physical Exam Constitutional:      General: He is not in acute distress.    Appearance: He is obese.  Musculoskeletal:         General: No deformity.  Neurological:     Mental Status: He is alert and oriented to person, place, and time.     Cranial Nerves: No dysarthria.     Coordination: Coordination normal.  Psychiatric:        Attention and Perception: Attention and perception normal. He does not perceive auditory or visual hallucinations.        Mood and Affect: Mood is anxious and depressed. Affect is blunt. Affect is not labile, angry or inappropriate.        Speech: Speech normal.        Behavior: Behavior is slowed. Behavior is cooperative.        Thought Content: Thought content normal. Thought content is not paranoid or delusional. Thought content does not include homicidal or suicidal ideation. Thought content does not include suicidal plan.        Cognition and Memory: Cognition and memory normal.        Judgment: Judgment normal.     Comments: Insight intact Residual depression and fatigue ongoing and worse OCD is about the same and not the main problem    November 06, 2018: [place] Cog test in office within normal limits MMSE 28/30. Animal fluency 17 . (borderline) Taken as a whole, no indication to pursue neuropsychological testing.  Mini-Mental status exam 28/30 on 10/27/20.  No evidence of dementia.  Lab Review:   Vitamin D level acceptable at 54.5.   Normal B12 and folate and TSH in last couple of years..  Echocardiogram is stable re: AVR over the last 8 years and not likely the cause of lethargy.  .res Assessment: Plan:    Huzaifa was seen today for follow-up, depression, anxiety, add and fatigue.  Diagnoses and all orders for this visit:  Major depressive disorder, recurrent episode, moderate (HCC) -     methylphenidate 36 MG PO CR tablet; Take 1 tablet (36 mg total) by mouth daily. -     cariprazine (VRAYLAR) 1.5 MG capsule; Take 1 capsule (  1.5 mg total) by mouth every other day.  Mixed obsessional thoughts and acts  Attention deficit hyperactivity disorder (ADHD),  predominantly inattentive type -     methylphenidate 36 MG PO CR tablet; Take 1 tablet (36 mg total) by mouth daily.  Hypersomnia with sleep apnea  Restless legs syndrome  Low vitamin D level  Mild cognitive impairment  Obstructive sleep apnea  Erectile disorder, acquired, generalized, moderate   30 min face to face time with patient was spent on counseling and coordination of care. We discussed Mr. Hur has a long history of depression and OCD which are partially controlled.  He requires frequent follow-up and his request because of significant residual depression and anxiety and concerns about polypharmacy and he wants regular therapeutic advice on how to further reduce his OCD.  He believes the depression is a consequence of the residual OCD.  He has some compulsive checking and obsessions around the house maintenance.  He wishes to avoid sexual side effects and so we are keeping the SSRI at the lowest possible dose.  He is tried all of the reasonable SSRI options with the exception possibly of sertraline but it is likely to have more sexual dysfunction than what he is taking now.  When travels then tends to have less OCD bc triggered less.    Was better energy with duloxetine 90 vs Lexapro so increased to 120 mg daily DT worsening depression.    But it is not better.  Disc option Vraylar potentiation.  Disc ECT option or Spravato in detail with pt and his wife. Discussed potential metabolic side effects associated with atypical antipsychotics, as well as potential risk for movement side effects. Advised pt to contact office if movement side effects occur.   His OCD is persistent with checking lites, stove, etc. .  It is worse at the moment DT a trigger involving his home.  Disc CBT techniques and potential for more therapy to address. Option Dr. Farrel Demark.  Overall his level of depression is mild to moderate with poor motivation to do things which bothers his wife.  Supportive  therapy in solving this.Marland Kitchen  He is able to find things that he enjoys and he does have interests but they are reduced below normal for him.Marland Kitchen  He improved activity levels generally.  And He feels that Ritalin is helpful for energy, productivity, focus and attention.  Fearful about potential BP effects.  Suggest talk with card about that. Continue recent retrial Concerta 36 mg AM He wants to use it for depression.  Discussed potential benefits, risks, and side effects of stimulants with patient to include increased heart rate, palpitations, insomnia, increased anxiety, increased irritability, or decreased appetite.  Instructed patient to contact office if experiencing any significant tolerability issues. Disc risk of increasing the Ritalin further elevating BP and pulse if we increase the Ritalin.  Need to verify that it's not markedly elevated from taking the Ritalin. Doesn't think it's been consistently elevated. Disc crash risk.  He doesn't need feel it causes crash.  Disc difference between different types of cognitive difficulties such as Alzheimer's disease for which there is no evidence and ADD related inattentiveness which can contribute to some of the cognitive problems that his wife identifies.  He may also have longstanding underlying ADD.  He has used Ritalin but it is inherently short acting and he is only taking it once a day.    Extensive discussion of sleep study and he has a copy.  Whys is there  virtually no N3 & REM sleep?  Is that affecting daytime alertness and fatigue?  Vs how much is related to mild OSA and PLMS?  Disc this in detail.  Wrote coreespondence with sleep doc about it.  Options for sleep & fatigue:  Difficult to assess  bc has been [place] and then sick for 3 weeks.   Focus on reduction in OSA Focus on improving deep stage sleep.  ? Rx low dose mirtazapine or alternatives Focus on leg movements (hx RLS/PLMS) continue Trial as had been suggested Lyrica for FM type  sx and may help RLS/PLMS.  Better dreaming on 100 mg HS and too sleepy on 150.  Decreased  to 100 mg HS for sleep and back pain and RLS No ropinirole needed.   Disc SE.  Not having any.   Use LED Xanax and try to avoid BZ daytime bc fatigue.  He doesn't use it much daythime.  Using 0..25-0.5 mg at night.  May need less with more Lyrica at [facility] and try to minimize.  No hangover. We discussed the short-term risks associated with benzodiazepines including sedation and increased fall risk among others.  Discussed long-term side effect risk including dependence, potential withdrawal symptoms, and the potential eventual dose-related risk of dementia.  But recent studies from 2020 dispute this association between benzodiazepines and dementia risk. Newer studies in 2020 do not support an association with dementia.  Thinks memory problem relate to OCD bc often counting in the in the background at rest which tends to reduce his attention.  Ritalin is being successfully used off label to augment antidepressants for depression and have resulted in improved productivity and attention.  Previous screening of memory was not suggestive of any neuro degenerative process. Mini-Mental status exam 28/30 on 10/27/20.  No evidence of dementia.  He has lost sig weight on Ozempic. Disc use of this for weight loss.  This will help a number of things including joints.   Option sildenafil .  He wishes to defer.  No problem with the reduction in ropinirole. RLS managed.  Still no complaints. Disc also dx PLMS.  Wife denies noticing it lately.  Supportive therapy and problem solving around distinguishing decision from OCD.  [place] getting back to exercise.  Disc naltrexone and wt loss given his lack of sufficient repsonse with Ozempic 2mg  weekly.  Disc dosing.  He is finding it helpful.   Discussed potential metabolic side effects associated with atypical antipsychotics, as well as potential risk for movement side effects.  Advised pt to contact office if movement side effects occur.   Plan: Meds:  continue duloxetine to 120 mg daily.  Off Lexapro.  Lyrica 100 mg HS.  resumed Ritalin 30 AM with some benefit. DC Abiilify Vraylar 1.5 mg every other day  Consider Spravato disc in detail.  He wants to pursue if not better with Vraylar.  Started seeing [employer] [facility] counseling again.  Follow-up 4 weeks per pt request  Meredith Staggers MD, DFAPA.  Please see After Visit Summary for patient specific instructions.  Future Appointments  Date Time Provider Department Center  05/21/2023  1:00 PM Cottle, Steva Ready., MD [facility] None  06/18/2023  2:00 PM Cottle, Steva Ready., MD [facility] None  07/16/2023  2:00 PM Cottle, Steva Ready., MD [facility] None  08/13/2023  1:30 PM Cottle, Steva Ready., MD [facility] None  09/17/2023  1:00 PM Cottle, Steva Ready., MD [facility] None  10/15/2023  1:30 PM Jennelle Human, Iona Hansen  Threasa Alpha., MD [facility] None     No orders of the defined types were placed in this encounter.      -------------------------------

## 2023-04-24 DIAGNOSIS — E7800 Pure hypercholesterolemia, unspecified: Secondary | ICD-10-CM | POA: Diagnosis not present

## 2023-04-24 DIAGNOSIS — R7303 Prediabetes: Secondary | ICD-10-CM | POA: Diagnosis not present

## 2023-04-24 DIAGNOSIS — Z79899 Other long term (current) drug therapy: Secondary | ICD-10-CM | POA: Diagnosis not present

## 2023-04-26 DIAGNOSIS — E7800 Pure hypercholesterolemia, unspecified: Secondary | ICD-10-CM | POA: Diagnosis not present

## 2023-04-26 DIAGNOSIS — R7303 Prediabetes: Secondary | ICD-10-CM | POA: Diagnosis not present

## 2023-04-26 DIAGNOSIS — F331 Major depressive disorder, recurrent, moderate: Secondary | ICD-10-CM | POA: Diagnosis not present

## 2023-04-26 DIAGNOSIS — Z79899 Other long term (current) drug therapy: Secondary | ICD-10-CM | POA: Diagnosis not present

## 2023-04-26 DIAGNOSIS — I351 Nonrheumatic aortic (valve) insufficiency: Secondary | ICD-10-CM | POA: Diagnosis not present

## 2023-04-26 DIAGNOSIS — Z0000 Encounter for general adult medical examination without abnormal findings: Secondary | ICD-10-CM | POA: Diagnosis not present

## 2023-04-30 DIAGNOSIS — E66812 Obesity, class 2: Secondary | ICD-10-CM | POA: Diagnosis not present

## 2023-04-30 DIAGNOSIS — R7303 Prediabetes: Secondary | ICD-10-CM | POA: Diagnosis not present

## 2023-04-30 DIAGNOSIS — E7800 Pure hypercholesterolemia, unspecified: Secondary | ICD-10-CM | POA: Diagnosis not present

## 2023-05-07 DIAGNOSIS — H903 Sensorineural hearing loss, bilateral: Secondary | ICD-10-CM | POA: Diagnosis not present

## 2023-05-08 ENCOUNTER — Encounter (HOSPITAL_BASED_OUTPATIENT_CLINIC_OR_DEPARTMENT_OTHER): Payer: Self-pay | Admitting: Student

## 2023-05-08 ENCOUNTER — Ambulatory Visit (HOSPITAL_BASED_OUTPATIENT_CLINIC_OR_DEPARTMENT_OTHER): Admitting: Student

## 2023-05-08 DIAGNOSIS — M25551 Pain in right hip: Secondary | ICD-10-CM

## 2023-05-08 DIAGNOSIS — M25552 Pain in left hip: Secondary | ICD-10-CM

## 2023-05-08 MED ORDER — LIDOCAINE HCL 1 % IJ SOLN
4.0000 mL | INTRAMUSCULAR | Status: AC | PRN
  Administered 2023-05-08: 4 mL

## 2023-05-08 MED ORDER — TRIAMCINOLONE ACETONIDE 40 MG/ML IJ SUSP
2.0000 mL | INTRAMUSCULAR | Status: AC | PRN
  Administered 2023-05-08: 2 mL via INTRA_ARTICULAR

## 2023-05-08 NOTE — Progress Notes (Signed)
 Chief Complaint: Bilateral hip pain     History of Present Illness:    Eric [name] is a 73 y.o. male presenting today for evaluation of pain in bilateral hips.  He was last seen by Dr. Denyse Amass with sports medicine on 04/25/2022 and received bilateral hip joint injections, which did give some relief but overall took away about 50% of his pain.  Today he reports that his pain is located more in the lateral hips without any significant groin pain.  Pain is worsened with long periods of walking.  Denies any recent falls or injuries.  No numbness or tingling.  Takes naproxen which does give him some mild relief.  He is leaving in a few days for a trip to visit his daughter in [place].  Surgical History:   None  PMH/PSH/Family History/Social History/Meds/Allergies:    Past Medical History:  Diagnosis Date   Allergy    Anemia    iron- pt denies    Anxiety    Aortic cusp regurgitation    Carotid artery occlusion    Constipation    Coronary artery stenosis    Hyperlipidemia    Lactose intolerance    Major depression, recurrent, chronic (HCC)    Obesity    OCD (obsessive compulsive disorder)    OSA (obstructive sleep apnea)    Other chronic pain    Periodic limb movement disorder    Periodic limb movements of sleep    Prediabetes    Pure hypercholesterolemia    Restless legs    Sleep apnea    wears cpap    Vitamin D deficiency    Past Surgical History:  Procedure Laterality Date   COLONOSCOPY     DG BARIUM ENEMA ([facility] HX)     10-19-2010- turtous, elongated colon    HEMORRHOID SURGERY     TONSILLECTOMY  1960   Social History   Socioeconomic History   Marital status: Married    Spouse name: Not on file   Number of children: Not on file   Years of education: Not on file   Highest education level: Not on file  Occupational History   Occupation: retired Pensions consultant  Tobacco Use   Smoking status: Never   Smokeless tobacco: Never   Substance and Sexual Activity   Alcohol use: Yes    Comment: occasionally    Drug use: No   Sexual activity: Not on file  Other Topics Concern   Not on file  Social History Narrative   Not on file   Social Drivers of Health   Financial Resource Strain: Not on file  Food Insecurity: No Food Insecurity (01/25/2022)   [facility] to [facility] prior to 03[facility]24/[facility]22[facility]   Hunger Vital Sign    Worried About Running Out of Food in the Last Year: Never true    Ran Out of Food in the Last Year: Never true  Transportation Needs: No Transportation Needs (01/25/2022)   Received from [facility], [facility] visits prior to 03/24/2022., [facility] visits prior to 03/24/2022., [facility]   PRAPARE - Transportation    Lack of Transportation (Medical): No    Lack of Transportation (Non-Medical): No  Physical  Activity: Not on file  Stress: Not on file  Social Connections: Not on file   Family History  Problem Relation Age of Onset   Cancer Mother        breast and ovarian   Anxiety disorder Mother    Breast cancer Mother    Ovarian cancer Mother    Depression Father        bi-polar   Hyperlipidemia Father    Heart disease Father    Sudden death Father    Bipolar disorder Father    Sleep apnea Father    Obesity Father    Depression Son    Colon cancer Neg Hx    Colon polyps Neg Hx    Esophageal cancer Neg Hx    Rectal cancer Neg Hx    Stomach cancer Neg Hx    Allergies  Allergen Reactions   E.E.S. [Erythromycin] Hives   Macrolides And Ketolides Other (See Comments)    EES    Rosuvastatin     Other reaction(s): cramps   Current Outpatient Medications  Medication Sig Dispense Refill   ALPRAZolam (XANAX) 0.25 MG tablet TAKE 1 TABLET (0.25 MG TOTAL) BY MOUTH 2 (TWO) TIMES DAILY AS NEEDED FOR ANXIETY OR SLEEP. 30 tablet 1    ARIPiprazole (ABILIFY) 2 MG tablet TAKE 1 TABLET BY MOUTH EVERY DAY 90 tablet 0   aspirin 81 MG tablet Take 81 mg by mouth daily.     cariprazine (VRAYLAR) 1.5 MG capsule Take 1 capsule (1.5 mg total) by mouth every other day.     Cholecalciferol (VITAMIN D-3) 5000 units TABS Take 5,000 Units by mouth daily.      DULoxetine (CYMBALTA) 60 MG capsule Take 2 capsules (120 mg total) by mouth daily. 180 capsule 0   ergocalciferol (VITAMIN D2) 1.25 MG (50000 UT) capsule Take 50,000 Units by mouth once a week.     LINZESS 145 MCG CAPS capsule Take 145 mcg by mouth daily before breakfast.     methylphenidate 36 MG PO CR tablet Take 1 tablet (36 mg total) by mouth daily. 30 tablet 0   naltrexone (DEPADE) 50 MG tablet Take 0.5 tablets (25 mg total) by mouth daily. 45 tablet 1   pregabalin (LYRICA) 100 MG capsule Take 1 capsule (100 mg total) by mouth at bedtime. 90 capsule 3   risperiDONE (RISPERDAL) 0.5 MG tablet Take 0.5 mg by mouth as needed. PRN (Patient not taking: Reported on 04/16/2023)     rOPINIRole (REQUIP) 1 MG tablet TAKE 3 TABLETS (3 MG TOTAL) BY MOUTH AT BEDTIME. 270 tablet 0   Semaglutide,0.25 or 0.5MG /DOS, (OZEMPIC, 0.25 OR 0.5 MG/DOSE,) 2 MG/1.5ML SOPN Inject 2.4 mg into the skin once a week.     simvastatin (ZOCOR) 20 MG tablet Take 20 mg by mouth at bedtime.     No current facility-administered medications for this visit.   No results found.  Review of Systems:   A ROS was performed including pertinent positives and negatives as documented in the HPI.  Physical Exam :   Constitutional: NAD and appears stated age Neurological: Alert and oriented Psych: Appropriate affect and cooperative There were no vitals taken for this visit.   Comprehensive Musculoskeletal Exam:    Exam of bilateral hips demonstrates point tenderness over the greater trochanters.  No overlying erythema or warmth.  Passive hip range of motion to 110 degrees flexion, 30 degrees external rotation, 20 degrees  internal rotation.  Patient ambulating with normal gait.  Imaging:  Assessment:   73 y.o. male with chronic bilateral hip pain.  Pain does present laterally with tenderness over the greater trochanters, consistent with trochanteric bursitis versus gluteal tendinopathy.  Given his ongoing symptoms and upcoming travel, I have offered cortisone injections of the greater trochanters for symptomatic relief.  Patient is agreeable to this and injections performed under ultrasound guidance which he tolerated well.  Discussed that should groin pain recur, he can return to clinic with myself or Dr. Alease Hunter for evaluation and potential repeat hip joint injections if needed.  Plan :    - Bilateral greater trochanter cortisone injections performed today - Return to clinic as needed    Procedure Note  Patient: Eric [name]             Date of Birth: 1950-08-18           MRN: [id number]             Visit Date: 05/08/2023  Procedures: Visit Diagnoses:  1. Greater trochanteric pain syndrome of both lower extremities     Large Joint Inj: bilateral greater trochanter on 05/08/2023 5:16 PM Indications: pain Details: 22 G 3.5 in needle, lateral approach Medications (Right): 4 mL lidocaine 1 %; 2 mL triamcinolone acetonide 40 MG/ML Medications (Left): 4 mL lidocaine 1 %; 2 mL triamcinolone acetonide 40 MG/ML Outcome: tolerated well, no immediate complications Procedure, treatment alternatives, risks and benefits explained, specific risks discussed. Consent was given by the patient. Immediately prior to procedure a time out was called to verify the correct patient, procedure, equipment, support staff and site/side marked as required. Patient was prepped and draped in the usual sterile fashion.        I personally saw and evaluated the patient, and participated in the management and treatment plan.  Sharrell Deck, PA-C Orthopedics

## 2023-05-09 ENCOUNTER — Ambulatory Visit: Admitting: Physician Assistant

## 2023-05-14 ENCOUNTER — Telehealth: Payer: Self-pay

## 2023-05-14 NOTE — Telephone Encounter (Signed)
 PA initiated for Methylphenidate  36 mg CR

## 2023-05-15 ENCOUNTER — Ambulatory Visit: Admitting: Family Medicine

## 2023-05-17 NOTE — Telephone Encounter (Signed)
 Prior Approval received effective 05/15/23-05/13/24 for Methylphenidate  ER 36 mg with [facility]

## 2023-05-21 ENCOUNTER — Ambulatory Visit: Payer: Medicare Other | Admitting: Psychiatry

## 2023-05-22 ENCOUNTER — Telehealth: Payer: Self-pay | Admitting: Psychiatry

## 2023-05-22 NOTE — Telephone Encounter (Signed)
 PT was called today to go over Spravato treatment and benfits. He will come by to sign forms for Spravato. He mentioned he has a few samples left of Vraylar  1.5mg  and needs to get more samples if he is supposed to continue it. He also needs to know if dosing will remain the same -every other day, or take daily going forward. He can pick up samples if available.

## 2023-05-22 NOTE — Telephone Encounter (Signed)
 Pt is coming to the office today to sign paperwork for Spravato. He wants to get samples of Vaylar if he should still take it. Please advise.   Vraylar  1.5 mg every other day   Consider Spravato disc in detail.  He wants to pursue if not better with Vraylar .

## 2023-05-22 NOTE — Telephone Encounter (Signed)
 Is he seeing benefit from the Vraylar ?  If so continue it 1 capsule every other day.  If no benefit then stop it. And we will pursue Spravato.

## 2023-05-23 NOTE — Telephone Encounter (Signed)
 It typically takes 3-4 weeks before we can get Spravato approved.   His wife usually has an opinion on his med response.  Have him ask her if she sees benefit.  If not stop it.  If so he can increase to Vraylar  1.5 mg daily.

## 2023-05-23 NOTE — Telephone Encounter (Signed)
 Pt reporting he does see some benefit from Vraylar  at every other day but not enough.

## 2023-05-24 NOTE — Telephone Encounter (Signed)
 LVM to [facility]

## 2023-05-27 NOTE — Telephone Encounter (Signed)
 Patient wanted to try taking Vraylar  1.5 daily. Asked him to FU next week and let us  know how he is doing. He has samples for 3 weeks.

## 2023-05-28 DIAGNOSIS — Z79899 Other long term (current) drug therapy: Secondary | ICD-10-CM | POA: Diagnosis not present

## 2023-05-28 DIAGNOSIS — R7303 Prediabetes: Secondary | ICD-10-CM | POA: Diagnosis not present

## 2023-05-28 DIAGNOSIS — G4733 Obstructive sleep apnea (adult) (pediatric): Secondary | ICD-10-CM | POA: Diagnosis not present

## 2023-05-28 DIAGNOSIS — K5909 Other constipation: Secondary | ICD-10-CM | POA: Diagnosis not present

## 2023-05-28 DIAGNOSIS — Z6836 Body mass index (BMI) 36.0-36.9, adult: Secondary | ICD-10-CM | POA: Diagnosis not present

## 2023-06-03 DIAGNOSIS — G4733 Obstructive sleep apnea (adult) (pediatric): Secondary | ICD-10-CM | POA: Diagnosis not present

## 2023-06-05 ENCOUNTER — Other Ambulatory Visit: Payer: Self-pay

## 2023-06-05 ENCOUNTER — Telehealth: Payer: Self-pay | Admitting: Psychiatry

## 2023-06-05 DIAGNOSIS — F900 Attention-deficit hyperactivity disorder, predominantly inattentive type: Secondary | ICD-10-CM

## 2023-06-05 DIAGNOSIS — F331 Major depressive disorder, recurrent, moderate: Secondary | ICD-10-CM

## 2023-06-05 MED ORDER — METHYLPHENIDATE HCL ER (OSM) 36 MG PO TBCR: 36.0000 mg | EXTENDED_RELEASE_TABLET | Freq: Every day | ORAL | 0 refills | Status: DC

## 2023-06-05 NOTE — Telephone Encounter (Signed)
 Pended.

## 2023-06-05 NOTE — Telephone Encounter (Signed)
 Pt called requesting a refill on his methylphenidate  36 mg PO CR. Pharmacy is [facility] [address]

## 2023-06-13 DIAGNOSIS — E559 Vitamin D deficiency, unspecified: Secondary | ICD-10-CM | POA: Diagnosis not present

## 2023-06-13 DIAGNOSIS — R258 Other abnormal involuntary movements: Secondary | ICD-10-CM | POA: Diagnosis not present

## 2023-06-16 ENCOUNTER — Other Ambulatory Visit: Payer: Self-pay | Admitting: Psychiatry

## 2023-06-16 DIAGNOSIS — F422 Mixed obsessional thoughts and acts: Secondary | ICD-10-CM

## 2023-06-16 DIAGNOSIS — F331 Major depressive disorder, recurrent, moderate: Secondary | ICD-10-CM

## 2023-06-18 ENCOUNTER — Other Ambulatory Visit: Payer: Self-pay | Admitting: Family Medicine

## 2023-06-18 ENCOUNTER — Ambulatory Visit: Payer: Medicare Other | Admitting: Psychiatry

## 2023-06-18 DIAGNOSIS — F331 Major depressive disorder, recurrent, moderate: Secondary | ICD-10-CM

## 2023-06-18 DIAGNOSIS — F422 Mixed obsessional thoughts and acts: Secondary | ICD-10-CM

## 2023-06-18 DIAGNOSIS — F339 Major depressive disorder, recurrent, unspecified: Secondary | ICD-10-CM | POA: Diagnosis not present

## 2023-06-18 DIAGNOSIS — F900 Attention-deficit hyperactivity disorder, predominantly inattentive type: Secondary | ICD-10-CM | POA: Diagnosis not present

## 2023-06-18 DIAGNOSIS — G2581 Restless legs syndrome: Secondary | ICD-10-CM

## 2023-06-18 DIAGNOSIS — G4710 Hypersomnia, unspecified: Secondary | ICD-10-CM

## 2023-06-18 DIAGNOSIS — G8929 Other chronic pain: Secondary | ICD-10-CM

## 2023-06-18 DIAGNOSIS — R258 Other abnormal involuntary movements: Secondary | ICD-10-CM

## 2023-06-18 DIAGNOSIS — R7989 Other specified abnormal findings of blood chemistry: Secondary | ICD-10-CM

## 2023-06-18 DIAGNOSIS — G4730 Sleep apnea, unspecified: Secondary | ICD-10-CM

## 2023-06-18 DIAGNOSIS — G4733 Obstructive sleep apnea (adult) (pediatric): Secondary | ICD-10-CM

## 2023-06-18 DIAGNOSIS — F5221 Male erectile disorder: Secondary | ICD-10-CM

## 2023-06-18 MED ORDER — DULOXETINE HCL 30 MG PO CPEP
90.0000 mg | ORAL_CAPSULE | Freq: Every day | ORAL | 0 refills | Status: DC
Start: 2023-06-18 — End: 2023-09-03

## 2023-06-18 MED ORDER — PREGABALIN 50 MG PO CAPS: 50.0000 mg | ORAL_CAPSULE | Freq: Every evening | ORAL | 0 refills | Status: DC

## 2023-06-18 NOTE — Progress Notes (Unsigned)
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 often naps.   Sometimes poor sleep. Plan  no changes  01/21/23 appt noted: Worrying too much bc OCD.  Trying to decide about how to deal with a trigger lately.  OCD driving me crazy wanting to make things perfect.   Other than the trigger had a nice time since here.  GS here for 5 days at 73 yo.  Enjoyed that.   Taking semaglutide   down to 250# from 283#. Card at [facility] says he does not have Aortic aneurysm.  Measuring is OK.  Will FU with MRI in June.   Some diarrhea lately. Cannot stop worrying.  Triggered by the plumbing issue. Meds as above.  No SE of sig.   Plan:  switched to duloxetine  to 90 mg daily.  Off Lexapro .  Others as noted. Lyrica  100 mg HS. Has held Ritalin  10 TID, can resume as long as SBP below 140 per card.  none needed ropinirole  3 mg pm.  No risperidone .  02/18/23 appt noted:  with Adriana Hopping Meds: as above. Taking Ritalin  30% of night.  Prn Xanax  rarely if gets off sleep cycle. No SE.   "Terribly dep" but not as bad as in the past. Taking Ritalin  appears to help significantly and started doing that.  Tend to stay in bed till noon but pattern of up late.   OCD about the same with some avoidance of detail work.   Afraid I'll throw away something I need.   She doesn't know whether Ritalin  helps No sig RLS and wife agrees. Thinks of deceased B at the holidays but worries over son Donavon Fudge too.   Plan: Increase duloxetine  to 120 mg daily.  Off Lexapro .  Others as noted. Lyrica  100 mg HS.  resumed Ritalin  30 AM with some benefit. no  risperidone .  03/19/23 appt noted: Psych med:  duloxetine  120, Ritalin  20 am  missing doses, Lyrica  100 HS, no risperidone , no ropinirole .   No improvement in depression since increase dose duloxetine .  More dep than usual.  Worrying about everything.  Including taxes.  All I can see is a black hole.  Making him feel negative about everything.   Keeping him from doing things.   OCD is not much different from when on Lexapro .  Wife agrees dep and distracted. Plan: resumed Ritalin  30 AM with some benefit. Resume Abilify  2 mg AM for dep.  04/16/23 appt noted:  wife here Psych med:  duloxetine  120, resumed Concerta  36 mg AM, Lyrica  100 HS, Abilify  2, no ropinirole .   Wife noted he seemed manic yesterday.  Invited window estimate against wife's will and without her input.   No mania noted until yesterday. He didn't feel manic yesterday.   He's noticed no change with Abilify  and still feels flat and a little low.  From [facility] energy and mood a little better.  Sleeps until 10-11 am about 12 hours. And asleep that whole time. Wife says he doesn't eat much bc he's not awake much. Trouble with hygiene.   Plan: Meds: continue duloxetine  to 120 mg daily.  Off Lexapro .  Lyrica  100 mg HS.  resumed Ritalin  30 AM with some benefit. DC Abiilify Vraylar  1.5 mg every other day Spravato disc in detail.  He wants to pursue if not better with Vraylar .  06/18/23 appt noted: with W Med:  duloxetine  120, resumed Concerta  36 mg AM, Lyrica  100 HS, no ropinirole .   Vraylar  1.5 every other day, no benefit Spravato denied by insurance but is being appealed. More trouble walking and shorter gait.  Neuro  in [place] appt in Sept.   Looking to get in in [place].  Can't be much more dep than I am.   OCD residual when has to make a decision.   ED is more of a px. Reduced voice family.   ECT-MADRS    Flowsheet Row Office Visit from 04/16/2023 in [facility]  MADRS Total Score 36      PHQ2-9    Flowsheet Row Office Visit from 03/15/2020 in [facility] Total Score 3  PHQ-9 Total Score 9        B schizophrenic SUI. After M's death. PCP Sherie Dine at [place]  [employer] at 73 years old.  Prior psychiatric medication trials include  Lexapro  20, citalopram NR, clomipramine weight gain, paroxetine, fluoxetine, Luvox, Trintellix,   Increase Lexapro  back to 20 mg January 2020. & 10/2020 bupropion ,  Auvelity NR  Abilify  10 fatigue,  Cerefolin NAC, and   Naltrexone  sexual SE Lyrica  150 tired  pramipexole,  ropinirole  Adderall XR & IR sexual SE,  Ritalin  30, Concerta  72 mg AM NR modafinil  and Nuvigil,   History [employer] OCD  Review of Systems:  Review of Systems  Constitutional:  Positive for fatigue. Negative for fever.  Cardiovascular:  Negative for chest pain and palpitations.  Gastrointestinal:  Positive for diarrhea.  Genitourinary:        ED  Musculoskeletal:  Positive for arthralgias, gait problem and myalgias.  Neurological:  Negative for tremors and weakness.  Psychiatric/Behavioral:  Positive for dysphoric mood and sleep disturbance. Negative for agitation, behavioral problems, confusion, decreased concentration, hallucinations, self-injury and suicidal ideas. The patient is nervous/anxious. The patient is not hyperactive.     Medications:  I have reviewed the patient's current medications.  Current Outpatient Medications  Medication Sig Dispense Refill  . ALPRAZolam  (XANAX ) 0.25 MG tablet TAKE 1 TABLET (0.25 MG TOTAL) BY MOUTH 2 (TWO) TIMES DAILY AS NEEDED FOR ANXIETY OR  SLEEP. 30 tablet 1  . aspirin 81 MG tablet Take 81 mg by mouth daily.    . Cholecalciferol (VITAMIN D-3) 5000 units TABS Take 5,000 Units by mouth daily.     . methylphenidate  36 MG PO CR tablet Take 1 tablet (36 mg total) by mouth daily. 30 tablet 0  . naltrexone  (DEPADE) 50 MG tablet Take 0.5 tablets (25 mg total) by mouth daily. 45 tablet 1  . Semaglutide ,0.25 or 0.5MG /DOS, (OZEMPIC , 0.25 OR 0.5 MG/DOSE,) 2 MG/1.5ML SOPN Inject 2.4 mg into the skin once a week.    . simvastatin (ZOCOR) 20 MG tablet Take 20 mg by mouth at bedtime.    . cariprazine  (VRAYLAR ) 1.5 MG capsule Take 1 capsule (1.5 mg total) by mouth every other day. (Patient not taking: Reported on 06/18/2023)    . DULoxetine  (CYMBALTA ) 30 MG capsule Take 3 capsules (90 mg total) by mouth daily. 270 capsule 0  . pregabalin  (LYRICA ) 50 MG capsule Take 1 capsule (50 mg total) by mouth at bedtime. 90 capsule 0  . rOPINIRole  (REQUIP ) 1 MG tablet TAKE 3 TABLETS (3 MG TOTAL) BY MOUTH AT BEDTIME. 270 tablet 0   No current facility-administered medications for this visit.    Medication Side Effects: None sexual SE are better not  All gone.  Allergies:  Allergies  Allergen Reactions  . E.E.S. [Erythromycin] Hives  . Macrolides And Ketolides Other (See Comments)    EES   . Rosuvastatin     Other reaction(s): cramps    Past Medical History:  Diagnosis Date  . Allergy   . Anemia    iron- pt denies   . Anxiety   . Aortic cusp regurgitation   . Carotid artery occlusion   . Constipation   . Coronary artery stenosis   . Hyperlipidemia   . Lactose intolerance   . Major depression, recurrent, chronic (HCC)   . Obesity   . OCD (obsessive compulsive disorder)   . OSA (obstructive sleep apnea)   . Other chronic pain   . Periodic limb movement disorder   . Periodic limb movements of sleep   . Prediabetes   . Pure hypercholesterolemia   . Restless legs   . Sleep apnea    wears cpap   . Vitamin D deficiency     Family  History  Problem Relation Age of Onset  . Cancer Mother        breast and ovarian  . Anxiety disorder Mother   . Breast cancer Mother   . Ovarian cancer Mother   . Depression Father        bi-polar  . Hyperlipidemia Father   . Heart disease Father   . Sudden death Father   . Bipolar disorder Father   . Sleep apnea Father   . Obesity Father   . Depression Son   . Colon cancer Neg Hx   . Colon polyps Neg Hx   . Esophageal cancer Neg Hx   . Rectal cancer Neg Hx   . Stomach cancer Neg Hx     Social History   Socioeconomic History  . Marital status: Married    Spouse name: Not on file  . Number of children: Not on file  . Years of education: Not  on file  . Highest education level: Not on file  Occupational History  . Occupation: retired Pensions consultant  Tobacco Use  . Smoking status: Never  . Smokeless tobacco: Never  Substance and Sexual Activity  . Alcohol use: Yes    Comment: occasionally   . Drug use: No  . Sexual activity: Not on file  Other Topics Concern  . Not on file  Social History Narrative  . Not on file   Social Drivers of Health   Financial Resource Strain: Not on file  Food Insecurity: No Food Insecurity (01/25/2022)   [facility] from [name] visits prior to 03/24/2022., [facility] visits prior to 03/24/2022., [facility]   Hunger Vital Sign   . Worried About Programme researcher, broadcasting/film/video in the Last Year: Never true   . Ran Out of Food in the Last Year: Never true  Transportation Needs: No Transportation Needs (01/25/2022)   [facility], [facility] visits prior to 03/24/2022., [facility] visits prior to 03/24/2022., [facility]   PRAPARE - Transportation   . Lack of Transportation (Medical): No   . Lack of Transportation (Non-Medical): No  Physical Activity: Not on file  Stress: Not on file  Social Connections: Not on file  Intimate Partner  Violence: Not on file    Past Medical History, Surgical history, Social history, and Family history were reviewed and updated as appropriate.   Please see review of systems for further details on the patient's review from today.   Objective:   Physical Exam:  There were no vitals taken for this visit.  Physical Exam Constitutional:      General: He is not in acute distress.    Appearance: He is obese.  Musculoskeletal:        General: No deformity.  Neurological:     Mental Status: He is alert and oriented to person, place, and time.     Cranial Nerves: No dysarthria.     Coordination: Coordination normal.  Psychiatric:        Attention and Perception: Attention and perception normal. He does not perceive auditory or visual hallucinations.        Mood and Affect: Mood is anxious and depressed. Affect is blunt. Affect is not labile, angry or inappropriate.        Speech: Speech normal.        Behavior: Behavior is slowed. Behavior is cooperative.        Thought Content: Thought content normal. Thought content is not paranoid or delusional. Thought content does not include homicidal or suicidal ideation. Thought content does not include suicidal plan.        Cognition and Memory: Cognition and memory normal.        Judgment: Judgment normal.     Comments: Insight intact Residual depression and fatigue ongoing and worse OCD is about the same and not the main problem   November 06, 2018: [place] Cog test in office within normal limits MMSE 28/30. Animal fluency 17 . (borderline) Taken as a whole, no indication to pursue neuropsychological testing.  Mini-Mental status exam 28/30 on 10/27/20.  No evidence of dementia.  Lab Review:   Vitamin D level acceptable at 54.5.   Normal B12 and folate and TSH in last couple of years..  Echocardiogram is stable re: AVR over the last 8 years and not likely the cause of lethargy.  .res Assessment: Plan:  Eric [name] was seen today for  follow-up, depression, anxiety and add.  Diagnoses and all orders for this visit:  Recurrent major depression resistant to treatment [facility])  Mixed obsessional thoughts and acts -     DULoxetine  (CYMBALTA ) 30 MG capsule; Take 3 capsules (90 mg total) by mouth daily.  Attention deficit hyperactivity disorder (ADHD), predominantly inattentive type  Hypersomnia with sleep apnea -     pregabalin  (LYRICA ) 50 MG capsule; Take 1 capsule (50 mg total) by mouth at bedtime.  Restless legs syndrome -     pregabalin  (LYRICA ) 50 MG capsule; Take 1 capsule (50 mg total) by mouth at bedtime.  Low vitamin D level  Obstructive sleep apnea  Erectile disorder, acquired, generalized, moderate  Other chronic pain -     pregabalin  (LYRICA ) 50 MG capsule; Take 1 capsule (50 mg total) by mouth at bedtime.  Major depressive disorder, recurrent episode, moderate (HCC) -     DULoxetine  (CYMBALTA ) 30 MG capsule; Take 3 capsules (90 mg total) by mouth daily.    30 min face to face time with patient was spent on counseling and coordination of care. We discussed Eric [name] has a long history of depression and OCD which are partially controlled.  He requires frequent follow-up and his request because of significant residual depression and anxiety and concerns about polypharmacy and he wants regular therapeutic advice on how to further reduce his OCD.  He believes the depression is a consequence of the residual OCD.  He has some compulsive checking and obsessions around the house maintenance.  He wishes to avoid sexual side effects and so we are keeping the SSRI at the lowest possible dose.  He is tried all of the reasonable SSRI options with the exception possibly of sertraline but it is likely to have more sexual dysfunction than what he is taking now.  When travels then tends to have less OCD bc triggered less.    Was better energy with duloxetine  90 vs Lexapro  so increased to 120 mg daily DT worsening  depression.    But it is not better.  Disc option Vraylar  potentiation.  Disc ECT option or Spravato in detail with pt and his wife. Discussed potential metabolic side effects associated with atypical antipsychotics, as well as potential risk for movement side effects. Advised pt to contact office if movement side effects occur.   His OCD is persistent with checking lites, stove, etc. .  It is worse at the moment DT a trigger involving his home.  Disc CBT techniques and potential for more therapy to address. Option Dr. Caroleen [name].  Overall his level of depression is mild to moderate with poor motivation to do things which bothers his wife.  Supportive therapy in solving this.Eric [name]  He is able to find things that he enjoys and he does have interests but they are reduced below normal for him.Eric [name]  He improved activity levels generally.  And He feels that Ritalin  is helpful for energy, productivity, focus and attention.  Fearful about potential BP effects.  Suggest talk with card about that. Continue recent retrial Concerta  36 mg AM He wants to use it for depression.  Discussed potential benefits, risks, and side effects of stimulants with patient to include increased heart rate, palpitations, insomnia, increased anxiety, increased irritability, or decreased appetite.  Instructed patient to contact office if experiencing any significant tolerability issues. Disc risk of increasing the Ritalin  further elevating BP and pulse if we increase the Ritalin .  Need to verify that it's  not markedly elevated from taking the Ritalin . Doesn't think it's been consistently elevated. Disc crash risk.  He doesn't need feel it causes crash.  Disc difference between different types of cognitive difficulties such as Alzheimer's disease for which there is no evidence and ADD related inattentiveness which can contribute to some of the cognitive problems that his wife identifies.  He may also have longstanding underlying ADD.  He has  used Ritalin  but it is inherently short acting and he is only taking it once a day.    Extensive discussion of sleep study and he has a copy.  Whys is there virtually no N3 & REM sleep?  Is that affecting daytime alertness and fatigue?  Vs how much is related to mild OSA and PLMS?  Disc this in detail.  Wrote coreespondence with sleep doc about it.  Options for sleep & fatigue:  Difficult to assess  bc has been [place] of country and then sick for 3 weeks.   Focus on reduction in OSA Focus on improving deep stage sleep.  ? Rx low dose mirtazapine or alternatives Focus on leg movements (hx RLS/PLMS) continue Trial as had been suggested Lyrica  for FM type sx and may help RLS/PLMS.  Better dreaming on 100 mg HS and too sleepy on 150.  Decreased  to 100 mg HS for sleep and back pain and RLS No ropinirole  needed.   Disc SE.  Not having any.   Use LED Xanax  and try to avoid BZ daytime bc fatigue.  He doesn't use it much daythime.  Using 0..25-0.5 mg at night.  May need less with more Lyrica  at [facility] and try to minimize.  No hangover. We discussed the short-term risks associated with benzodiazepines including sedation and increased fall risk among others.  Discussed long-term side effect risk including dependence, potential withdrawal symptoms, and the potential eventual dose-related risk of dementia.  But recent studies from 2020 dispute this association between benzodiazepines and dementia risk. Newer studies in 2020 do not support an association with dementia.  Thinks memory problem relate to OCD bc often counting in the in the background at rest which tends to reduce his attention.  Ritalin  is being successfully used off label to augment antidepressants for depression and have resulted in improved productivity and attention.  Previous screening of memory was not suggestive of any neuro degenerative process. Mini-Mental status exam 28/30 on 10/27/20.  No evidence of dementia.  He has lost sig weight on  Ozempic . Disc use of this for weight loss.  This will help a number of things including joints.   Option sildenafil .  He wishes to defer.  No problem with the reduction in ropinirole . RLS managed.  Still no complaints. Disc also dx PLMS.  Wife denies noticing it lately.  Supportive therapy and problem solving around distinguishing decision from OCD.  Valda Garnet getting back to exercise.  Disc naltrexone  and wt loss given his lack of sufficient repsonse with Ozempic  2mg  weekly.  Disc dosing.  He is finding it helpful.   Discussed potential metabolic side effects associated with atypical antipsychotics, as well as potential risk for movement side effects. Advised pt to contact office if movement side effects occur.   Plan: Meds:  continue duloxetine  to 120 mg daily.  Off Lexapro .  Lyrica  100 mg HS.  resumed Ritalin  30 AM with some benefit. Increase Vraylar  1.5 mg daily  Consider Spravato disc in detail.  He wants to pursue .  Being appealed.  Started seeing Eric [name]  [facility] again.  Follow-up 4 weeks per pt request  Nori Beat MD, DFAPA.  Please see After Visit Summary for patient specific instructions.  Future Appointments  Date Time Provider Department Center  07/04/2023  2:30 PM Arnie Lao, MD [facility] None  07/16/2023  2:00 PM Cottle, Kennedy Peabody., MD [facility] None  08/13/2023  1:30 PM Cottle, Kennedy Peabody., MD [facility] None  09/17/2023  1:00 PM Cottle, Kennedy Peabody., MD [facility] None  10/14/2023  2:45 PM Debbra Fairy, MD [facility] None  10/15/2023  1:30 PM Cottle, Kennedy Peabody., MD [facility] None     No orders of the defined types were placed in this encounter.      -------------------------------

## 2023-06-19 DIAGNOSIS — K080 Exfoliation of teeth due to systemic causes: Secondary | ICD-10-CM | POA: Diagnosis not present

## 2023-06-21 ENCOUNTER — Encounter: Payer: Self-pay | Admitting: Psychiatry

## 2023-06-25 ENCOUNTER — Ambulatory Visit: Admitting: Psychiatry

## 2023-06-25 ENCOUNTER — Ambulatory Visit

## 2023-06-25 VITALS — BP 148/82 | HR 67

## 2023-06-25 DIAGNOSIS — G2581 Restless legs syndrome: Secondary | ICD-10-CM

## 2023-06-25 DIAGNOSIS — F422 Mixed obsessional thoughts and acts: Secondary | ICD-10-CM

## 2023-06-25 DIAGNOSIS — G3184 Mild cognitive impairment, so stated: Secondary | ICD-10-CM

## 2023-06-25 DIAGNOSIS — R2990 Unspecified symptoms and signs involving the nervous system: Secondary | ICD-10-CM | POA: Diagnosis not present

## 2023-06-25 DIAGNOSIS — G4733 Obstructive sleep apnea (adult) (pediatric): Secondary | ICD-10-CM

## 2023-06-25 DIAGNOSIS — R7989 Other specified abnormal findings of blood chemistry: Secondary | ICD-10-CM

## 2023-06-25 DIAGNOSIS — F5221 Male erectile disorder: Secondary | ICD-10-CM

## 2023-06-25 DIAGNOSIS — R7303 Prediabetes: Secondary | ICD-10-CM | POA: Diagnosis not present

## 2023-06-25 DIAGNOSIS — F339 Major depressive disorder, recurrent, unspecified: Secondary | ICD-10-CM

## 2023-06-25 DIAGNOSIS — G4710 Hypersomnia, unspecified: Secondary | ICD-10-CM

## 2023-06-25 DIAGNOSIS — F909 Attention-deficit hyperactivity disorder, unspecified type: Secondary | ICD-10-CM | POA: Diagnosis not present

## 2023-06-25 DIAGNOSIS — F900 Attention-deficit hyperactivity disorder, predominantly inattentive type: Secondary | ICD-10-CM

## 2023-06-25 NOTE — Progress Notes (Signed)
 NURSES NOTE:         Pt arrived for his 1st Spravato Treatment for treatment resistant depression, the starting dose is 56 mg (2 of the 28 mg nasal sprays) as tolerated and if no side effects or complaints the dose will increase to 84 mg at next treatment. Emerson is a patient of Dr. Guillermo Lees so he will follow his care throughout treatments and follow ups. Explained to pt how the treatments would be scheduled and answered any questions he had today, as well as his wife. Explained how the inhaler worked, gave him a Psychologist, educational to use and practice with. Pt's Spravato is a medical authorization through buy and bill.  Spravato medication is stored at treatment center per REMS/FDA guidelines. The medication is required to be locked behind two doors per REMS/FDA protocol. Medication is also disposed of properly after each use per regulations. All documentation for REMS is completed and submitted per FDA/REMS requirements.          Began taking patient's vital signs at 9:25 AM 136/78, pulse 64, SpO2 94%. Instructed patient to blow his nose if needed then recline back to a 45 degree angle. Gave patient first dose 28 mg nasal spray, administered in each nostril as directed and observed by nurse, waited 5 more minutes for the second dose. After both doses given pt did not complain of any nausea/vomiting. Assessed his 40 minute vitals, 10:15 AM, 152/79, pulse 69, SpO2 97%. Pt reports doing fine, no complaints voiced  Explained he would be monitored for a total time of 120 minutes. Discharge vitals were taken at 11:29 AM 148/82, P 67, SpO2 95%. Dr. Toi Foster came to visit with patient once his thoughts were clearer to discuss how treatment went. Recommend he go home and sleep or just relax on the couch. No driving, no intense activities. Verbalized understanding. Pt. will be receiving 2 treatments per week for 4 weeks as recommended. Nurse was with pt a total of 60 minutes for clinical assessment. Pt is scheduled this  Thursday, June 5th. Instructed to call with any issues.     LOT 23MG 500 EXP FEB 2027

## 2023-06-26 ENCOUNTER — Encounter: Payer: Self-pay | Admitting: Psychiatry

## 2023-06-26 NOTE — Progress Notes (Signed)
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 poor sleep. Plan  no changes  01/21/23 appt noted: Worrying too much bc OCD.  Trying to decide about how to deal with a trigger lately.  OCD driving me crazy wanting to make things perfect.   Other than the trigger had a nice time since here.  GS here for 5 days at 73 yo.  Enjoyed that.   Taking semaglutide   down to 250# from 283#. Card at [facility] says he does not have Aortic aneurysm.  Measuring is OK.  Will FU with MRI in June.   Some diarrhea lately. Cannot stop worrying.  Triggered by the plumbing issue. Meds as above.  No SE of sig.   Plan:  switched to duloxetine  to 90 mg daily.  Off Lexapro .  Others as noted. Lyrica  100 mg HS. Has held Ritalin  10 TID, can resume as long as SBP below 140 per card.  none needed ropinirole  3 mg pm.  No risperidone .  02/18/23 appt noted:  with Eric [name] Meds: as above. Taking Ritalin  30% of night.  Prn Xanax  rarely if gets off sleep cycle. No SE.   "Terribly dep" but not as bad as in the past. Taking Ritalin  appears to help significantly and started doing that.  Tend to stay in bed till noon but pattern of up late.   OCD about the same with some avoidance of detail work.   Afraid I'll throw away something I need.   She doesn't know whether Ritalin  helps No sig RLS and wife agrees. Thinks of deceased B at the holidays but worries over son Eric [name] too.   Plan: Increase duloxetine  to 120 mg daily.  Off Lexapro .  Others as noted. Lyrica  100 mg HS.  resumed Ritalin  30 AM  with some benefit. no risperidone .  03/19/23 appt noted: Psych med:  duloxetine  120, Ritalin  20 am  missing doses, Lyrica  100 HS, no risperidone , no ropinirole .   No improvement in depression since increase dose duloxetine .  More dep than usual.  Worrying about everything.  Including taxes.  All I can see is a black hole.  Making him feel negative about everything.   Keeping him from doing things.   OCD is not much different from when on Lexapro .  Wife agrees dep and distracted. Plan: resumed Ritalin  30 AM with some benefit. Resume Abilify  2 mg AM for dep.  04/16/23 appt noted:  wife here Psych med:  duloxetine  120, resumed Concerta  36 mg AM, Lyrica  100 HS, Abilify  2, no ropinirole .   Wife noted he seemed manic yesterday.  Invited window estimate against wife's will and without her input.   No mania noted until yesterday. He didn't feel manic yesterday.   He's noticed no change with Abilify  and still feels flat and a little low.  From [facility] energy and mood a little better.  Sleeps until 10-11 am about 12 hours. And asleep that whole time. Wife says he doesn't eat much bc he's not awake much. Trouble with hygiene.   Plan: Meds: continue duloxetine  to 120 mg daily.  Off Lexapro .  Lyrica  100 mg HS.  resumed Ritalin  30 AM with some benefit. DC Abiilify Vraylar  1.5 mg every other day Spravato disc in detail.  He wants to pursue if not better with Vraylar .  06/18/23 appt noted: with W Med:  duloxetine  120, resumed Concerta  36 mg AM, Lyrica  100 HS, no ropinirole .   Vraylar  1.5 every other day, no benefit Spravato denied by insurance but is being appealed. More trouble walking and shorter gait.  Neuro  in [place] appt in Sept.   Looking to get in in [place].  Can't be much more dep than I am.   OCD residual when has to make a decision.   ED is more of a px. Reduced voice family. Plan : Spravato  06/25/23 appt noted: with W Med:  duloxetine  120, Concerta  36 mg AM, Lyrica  100 HS, no ropinirole .   Vraylar  1.5 daily No SE Received Spravato 56 mg today.  Experienced very mild dissociation and no sig HA, N.  But some dizziness.  Resolved by end of 2 hours observation.  Able to leave office without assistance.  Ongoing dep without change.  Slow, reduced cognition, anhedonia, forgetful, low motivation. No other concerns with meds.     ECT-MADRS    Flowsheet Row Office Visit from 06/18/2023 in [facility] Office Visit from 04/16/2023 in [facility]  MADRS Total Score 37 36      PHQ2-9    Flowsheet Row Office Visit from 03/15/2020 in [facility] Total Score 3  PHQ-9 Total Score 9        B schizophrenic SUI. After M's death. PCP Sherie Dine at [facility] at 73 years old.  Prior psychiatric medication trials include  Lexapro  20, citalopram NR, clomipramine weight gain, paroxetine, fluoxetine, Luvox, Trintellix,   Increase Lexapro  back to 20 mg January 2020. & 10/2020 bupropion ,  Auvelity NR  Abilify  10 fatigue,  Cerefolin NAC, and   Naltrexone  sexual SE Lyrica  150 tired  pramipexole,  ropinirole  Adderall XR & IR sexual SE,  Ritalin  30, Concerta  72 mg AM NR modafinil  and Nuvigil,   History [employer]  OCD  Review of Systems:  Review of Systems  Constitutional:  Positive for fatigue. Negative for fever.  Cardiovascular:  Negative for chest pain and palpitations.  Genitourinary:        ED  Musculoskeletal:  Positive for arthralgias, gait problem and myalgias.  Neurological:  Negative for tremors and weakness.  Psychiatric/Behavioral:  Positive for  dysphoric mood and sleep disturbance. Negative for agitation, behavioral problems, confusion, decreased concentration, hallucinations, self-injury and suicidal ideas. The patient is nervous/anxious. The patient is not hyperactive.     Medications: I have reviewed the patient's current medications.  Current Outpatient Medications  Medication Sig Dispense Refill   ALPRAZolam  (XANAX ) 0.25 MG tablet TAKE 1 TABLET (0.25 MG TOTAL) BY MOUTH 2 (TWO) TIMES DAILY AS NEEDED FOR ANXIETY OR SLEEP. 30 tablet 1   aspirin 81 MG tablet Take 81 mg by mouth daily.     Cholecalciferol (VITAMIN D-3) 5000 units TABS Take 5,000 Units by mouth daily.      DULoxetine  (CYMBALTA ) 30 MG capsule Take 3 capsules (90 mg total) by mouth daily. 270 capsule 0   methylphenidate  36 MG PO CR tablet Take 1 tablet (36 mg total) by mouth daily. 30 tablet 0   naltrexone  (DEPADE) 50 MG tablet Take 0.5 tablets (25 mg total) by mouth daily. 45 tablet 1   pregabalin  (LYRICA ) 50 MG capsule Take 1 capsule (50 mg total) by mouth at bedtime. 90 capsule 0   Semaglutide ,0.25 or 0.5MG /DOS, (OZEMPIC , 0.25 OR 0.5 MG/DOSE,) 2 MG/1.5ML SOPN Inject 2.4 mg into the skin once a week.     simvastatin (ZOCOR) 20 MG tablet Take 20 mg by mouth at bedtime.     cariprazine  (VRAYLAR ) 1.5 MG capsule Take 1 capsule (1.5 mg total) by mouth every other day. (Patient not taking: Reported on 06/26/2023)     No current facility-administered medications for this visit.    Medication Side Effects: None sexual SE are better not  All gone.  Allergies:  Allergies  Allergen Reactions   E.E.S. [Erythromycin] Hives   Macrolides And Ketolides Other (See Comments)    EES    Rosuvastatin     Other reaction(s): cramps    Past Medical History:  Diagnosis Date   Allergy    Anemia    iron- pt denies    Anxiety    Aortic cusp regurgitation    Carotid artery occlusion    Constipation    Coronary artery stenosis    Hyperlipidemia    Lactose intolerance    Major  depression, recurrent, chronic (HCC)    Obesity    OCD (obsessive compulsive disorder)    OSA (obstructive sleep apnea)    Other chronic pain    Periodic limb movement disorder    Periodic limb movements of sleep    Prediabetes    Pure hypercholesterolemia    Restless legs    Sleep apnea    wears cpap    Vitamin D deficiency     Family History  Problem Relation Age of Onset   Cancer Mother        breast and ovarian   Anxiety disorder Mother    Breast cancer Mother    Ovarian cancer Mother    Depression Father        bi-polar   Hyperlipidemia Father    Heart disease Father    Sudden death Father    Bipolar disorder Father    Sleep apnea Father    Obesity Father    Depression Son  Colon cancer Neg Hx    Colon polyps Neg Hx    Esophageal cancer Neg Hx    Rectal cancer Neg Hx    Stomach cancer Neg Hx     Social History   Socioeconomic History   Marital status: Married    Spouse name: Not on file   Number of children: Not on file   Years of education: Not on file   Highest education level: Not on file  Occupational History   Occupation: retired Pensions consultant  Tobacco Use   Smoking status: Never   Smokeless tobacco: Never  Substance and Sexual Activity   Alcohol use: Yes    Comment: occasionally    Drug use: No   Sexual activity: Not on file  Other Topics Concern   Not on file  Social History Narrative   Not on file   Social Drivers of Health   Financial Resource Strain: Not on file  Food Insecurity: No Food Insecurity (01/25/2022)   [facility] prior to [facility]24[facility] prior to [facility]24/[facility]22[facility]   Hunger Vital Sign    Worried About Running Out of Food in the Last Year: Never true    Ran Out of Food in the Last Year: Never true  Transportation Needs: No Transportation Needs (01/25/2022)   Received from [employer], [facility] visits  prior to 03/24/2022., [facility] visits prior to 03/24/2022., [facility]   PRAPARE - Transportation    Lack of Transportation (Medical): No    Lack of Transportation (Non-Medical): No  Physical Activity: Not on file  Stress: Not on file  Social Connections: Not on file  Intimate Partner Violence: Not on file    Past Medical History, Surgical history, Social history, and Family history were reviewed and updated as appropriate.   Please see review of systems for further details on the patient's review from today.   Objective:   Physical Exam:  There were no vitals taken for this visit.  Physical Exam Constitutional:      General: He is not in acute distress.    Appearance: He is obese.  Musculoskeletal:        General: No deformity.  Neurological:     Mental Status: He is alert and oriented to person, place, and time.     Cranial Nerves: No dysarthria.     Coordination: Coordination normal.  Psychiatric:        Attention and Perception: Attention and perception normal. He does not perceive auditory or visual hallucinations.        Mood and Affect: Mood is anxious and depressed. Affect is blunt. Affect is not labile, angry or inappropriate.        Speech: Speech normal.        Behavior: Behavior is slowed. Behavior is cooperative.        Thought Content: Thought content normal. Thought content is not paranoid or delusional. Thought content does not include homicidal or suicidal ideation. Thought content does not include suicidal plan.        Cognition and Memory: Cognition and memory normal.        Judgment: Judgment normal.     Comments: Insight intact depression and fatigue ongoing and worse OCD is about the same and not the main problem    November 06, 2018: [place] Cog test in office within normal limits MMSE 28/30. Animal fluency 17 . (borderline)  Taken as a whole, no indication to pursue neuropsychological testing.  Mini-Mental status exam  28/30 on 10/27/20.  No evidence of dementia.  Lab Review:   Vitamin D level acceptable at 54.5.   Normal B12 and folate and TSH in last couple of years..  Echocardiogram is stable re: AVR over the last 8 years and not likely the cause of lethargy.  .res Assessment: Plan:    Ardean was seen today for follow-up, depression, anxiety, fatigue and add.  Diagnoses and all orders for this visit:  Recurrent major depression resistant to treatment [facility])  Mixed obsessional thoughts and acts  Attention deficit hyperactivity disorder (ADHD), predominantly inattentive type  Hypersomnia with sleep apnea  Restless legs syndrome  Low vitamin D level  Obstructive sleep apnea  Erectile disorder, acquired, generalized, moderate  Mild cognitive impairment   Mr. Poynter has a long history of depression and OCD which are partially controlled.    Was better energy with duloxetine  90 vs Lexapro  so increased to 120 mg daily DT worsening depression.    But it is not better.  Dep is much worse, in fact, his worst depression ever.  Will pursue Spravato  He has some compulsive checking and obsessions around the house maintenance.  When travels then tends to have less OCD bc triggered less.    No benefit with Vraylar .  Patient was administered Spravato 84 mg intranasally today.  The patient experienced the typical dissociation which gradually resolved over the 2-hour period of observation.  There were no complications.  Specifically the patient did not have nausea or vomiting or headache.  Blood pressures remained within normal ranges at the 40-minute and 2-hour follow-up intervals.  By the time the 2-hour observation period was met the patient was alert and oriented and able to exit without assistance.  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  See nursing note for further details.  His OCD is persistent  with checking lites, stove, etc. .  It is worse at the moment DT a trigger involving his home.  Disc CBT techniques and potential for more therapy to address. Option Dr. Caroleen Churn.  Continue recent retrial Concerta  36 mg AM He wants to use it for depression.  Discussed potential benefits, risks, and side effects of stimulants with patient to include increased heart rate, palpitations, insomnia, increased anxiety, increased irritability, or decreased appetite.  Instructed patient to contact office if experiencing any significant tolerability issues. Disc risk of increasing the Ritalin  further elevating BP and pulse if we increase the Ritalin .  Need to verify that it's not markedly elevated from taking the Ritalin . Doesn't think it's been consistently elevated. Disc crash risk.  He doesn't need feel it causes crash.  Extensive discussion of sleep study and he has a copy.  Why is there virtually no N3 & REM sleep?  Is that affecting daytime alertness and fatigue?  Vs how much is related to mild OSA and PLMS?  Disc this in detail.  Wrote coreespondence with sleep doc about it.  Options for sleep & fatigue:  Difficult to assess  bc has been out of country and then sick for 3 weeks.   Focus on reduction in OSA Focus on improving deep stage sleep.  ? Rx low dose mirtazapine or alternatives Focus on leg movements (hx RLS/PLMS) continue Trial as had been suggested Lyrica  for FM type sx and may help RLS/PLMS.  Better dreaming on 100 mg HS  and too sleepy on 150.  Decreased  to 100 mg HS for sleep and back pain and RLS No ropinirole  needed.   Disc SE.  Not having any.   Use LED Xanax  and try to avoid BZ daytime bc fatigue.  He doesn't use it much daythime.  Using 0..25-0.5 mg at night.  May need less with more Lyrica  at HS and try to minimize.  No hangover. We discussed the short-term risks associated with benzodiazepines including sedation and increased fall risk among others.  Discussed long-term side  effect risk including dependence, potential withdrawal symptoms, and the potential eventual dose-related risk of dementia.  But recent studies from 2020 dispute this association between benzodiazepines and dementia risk. Newer studies in 2020 do not support an association with dementia.  Thinks memory problem relate to OCD bc often counting in the in the background at rest which tends to reduce his attention.  Ritalin  is being successfully used off label to augment antidepressants for depression and have resulted in improved productivity and attention.  Previous screening of memory was not suggestive of any neuro degenerative process. Mini-Mental status exam 28/30 on 10/27/20.  No evidence of dementia.  He has lost sig weight on Ozempic . Disc use of this for weight loss.  This will help a number of things including joints.  Discussed potential metabolic side effects associated with atypical antipsychotics, as well as potential risk for movement side effects. Advised pt to contact office if movement side effects occur.   Plan: Meds:  continue duloxetine  to 120 mg daily.  Off Lexapro .  Lyrica  100 mg HS.  resumed Ritalin  30 AM with some benefit. Will likely stop Vraylar .   Spravato disc in detail.  He wants to pursue .  Administer twice weekly for at least a month.  Will administer at 84 mg .   Follow-up   Nori Beat MD, DFAPA.  Please see After Visit Summary for patient specific instructions.  Future Appointments  Date Time Provider Department Center  06/27/2023  9:00 AM Cottle, Kennedy Peabody., MD [facility] None  06/27/2023  9:00 AM CP-NURSE [facility] None  06/28/2023 10:00 AM [facility] MOBILE UNIT [id number] [address]  07/02/2023  9:00 AM Cottle, Kennedy Peabody., MD [facility] None  07/02/2023  9:00 AM CP-NURSE [facility] None  07/04/2023  9:00 AM Cottle, Kennedy Peabody., MD [facility] None  07/04/2023  9:00 AM CP-NURSE [facility] None  07/04/2023  2:30 PM Arnie Lao, MD [facility] None  07/11/2023  9:00 AM Cottle, Kennedy Peabody., MD [facility] None  07/11/2023  9:00 AM CP-NURSE [facility] None  08/13/2023  1:30 PM Cottle, Kennedy Peabody., MD [facility] None  09/17/2023  1:00 PM Cottle, Kennedy Peabody., MD [facility] None  10/14/2023  2:45 PM Debbra Fairy, MD [facility] None  10/15/2023  1:30 PM Cottle, Kennedy Peabody., MD [facility] None     No orders of the defined types were placed in this encounter.      -------------------------------

## 2023-06-27 ENCOUNTER — Ambulatory Visit: Admitting: Psychiatry

## 2023-06-27 ENCOUNTER — Encounter: Payer: Self-pay | Admitting: Psychiatry

## 2023-06-27 ENCOUNTER — Ambulatory Visit

## 2023-06-27 VITALS — BP 143/74 | HR 69

## 2023-06-27 DIAGNOSIS — F339 Major depressive disorder, recurrent, unspecified: Secondary | ICD-10-CM | POA: Diagnosis not present

## 2023-06-27 DIAGNOSIS — G2581 Restless legs syndrome: Secondary | ICD-10-CM

## 2023-06-27 DIAGNOSIS — F5221 Male erectile disorder: Secondary | ICD-10-CM

## 2023-06-27 DIAGNOSIS — G4733 Obstructive sleep apnea (adult) (pediatric): Secondary | ICD-10-CM

## 2023-06-27 DIAGNOSIS — R7989 Other specified abnormal findings of blood chemistry: Secondary | ICD-10-CM

## 2023-06-27 DIAGNOSIS — G4710 Hypersomnia, unspecified: Secondary | ICD-10-CM

## 2023-06-27 DIAGNOSIS — G3184 Mild cognitive impairment, so stated: Secondary | ICD-10-CM

## 2023-06-27 DIAGNOSIS — F422 Mixed obsessional thoughts and acts: Secondary | ICD-10-CM

## 2023-06-27 DIAGNOSIS — F900 Attention-deficit hyperactivity disorder, predominantly inattentive type: Secondary | ICD-10-CM

## 2023-06-27 NOTE — Progress Notes (Signed)
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 naps.   Sometimes poor sleep. Plan  no changes  01/21/23 appt noted: Worrying too much bc OCD.  Trying to decide about how to deal with a trigger lately.  OCD driving me crazy wanting to make things perfect.   Other than the trigger had a nice time since here.  GS here for 5 days at 73 yo.  Enjoyed that.   Taking semaglutide   down to 250# from 283#. Card at [facility] says he does not have Aortic aneurysm.  Measuring is OK.  Will FU with MRI in June.   Some diarrhea lately. Cannot stop worrying.  Triggered by the plumbing issue. Meds as above.  No SE of sig.   Plan:  switched to duloxetine  to 90 mg daily.  Off Lexapro .  Others as noted. Lyrica  100 mg HS. Has held Ritalin  10 TID, can resume as long as SBP below 140 per card.  none needed ropinirole  3 mg pm.  No risperidone .  02/18/23 appt noted:  with Eric [name] Meds: as above. Taking Ritalin  30% of night.  Prn Xanax  rarely if gets off sleep cycle. No SE.   "Terribly dep" but not as bad as in the past. Taking Ritalin  appears to help significantly and started doing that.  Tend to stay in bed till noon but pattern of up late.   OCD about the same with some avoidance of detail work.   Afraid I'll throw away something I need.   She doesn't know whether Ritalin  helps No sig RLS and wife agrees. Thinks of deceased B at the holidays but worries over son Eric [name] too.   Plan: Increase duloxetine  to 120 mg daily.  Off Lexapro .  Others as noted. Lyrica  100 mg HS.  resumed Ritalin  30 AM with some benefit. no  risperidone .  03/19/23 appt noted: Psych med:  duloxetine  120, Ritalin  20 am  missing doses, Lyrica  100 HS, no risperidone , no ropinirole .   No improvement in depression since increase dose duloxetine .  More dep than usual.  Worrying about everything.  Including taxes.  All I can see is a black hole.  Making him feel negative about everything.   Keeping him from doing things.   OCD is not much different from when on Lexapro .  Wife agrees dep and distracted. Plan: resumed Ritalin  30 AM with some benefit. Resume Abilify  2 mg AM for dep.  04/16/23 appt noted:  wife here Psych med:  duloxetine  120, resumed Concerta  36 mg AM, Lyrica  100 HS, Abilify  2, no ropinirole .   Wife noted he seemed manic yesterday.  Invited window estimate against wife's will and without her input.   No mania noted until yesterday. He didn't feel manic yesterday.   He's noticed no change with Abilify  and still feels flat and a little low.  From MPH energy and mood a little better.  Sleeps until 10-11 am about 12 hours. And asleep that whole time. Wife says he doesn't eat much bc he's not awake much. Trouble with hygiene.   Plan: Meds: continue duloxetine  to 120 mg daily.  Off Lexapro .  Lyrica  100 mg HS.  resumed Ritalin  30 AM with some benefit. DC Abiilify Vraylar  1.5 mg every other day Spravato disc in detail.  He wants to pursue if not better with Vraylar .  06/18/23 appt noted: with W Med:  duloxetine  120, resumed Concerta  36 mg AM, Lyrica  100 HS, no ropinirole .   Vraylar  1.5 every other day, no benefit Spravato denied by insurance but is being appealed. More trouble walking and shorter gait.  Neuro  in [place] appt in Sept.   Looking to get in in [place].  Can't be much more dep than I am.   OCD residual when has to make a decision.   ED is more of a px. Reduced voice family. Plan : Spravato  06/25/23 appt noted: with W Med:  duloxetine  120, Concerta  36 mg AM, Lyrica  100 HS, no ropinirole .   Vraylar  1.5 daily No SE Received Spravato 56 mg today.  Experienced very mild dissociation and no sig HA, N.  But some dizziness.  Resolved by end of 2 hours observation.  Able to leave office without assistance.  Ongoing dep without change.  Slow, reduced cognition, anhedonia, forgetful, low motivation. No other concerns with meds.   06/27/23 appt noted:  Med: Med:  duloxetine  120, Concerta  36 mg AM, Lyrica  100 HS, no ropinirole .   Vraylar  1.5 daily No SE Received Spravato 84 mg todayfor the first time..  Experienced very mild dissociation and no sig HA, N.  But some dizziness.  Resolved by end of 2 hours observation.  Able to leave office without assistance.  Ongoing dep without change.  Slow, reduced cognition, anhedonia, forgetful, low motivation. No other concerns with meds.      ECT-MADRS    Flowsheet Row Office Visit from 06/18/2023 in [facility] Office Visit from 04/16/2023 in [facility]  MADRS Total Score 37 36      PHQ2-9    Flowsheet Row Office Visit from 03/15/2020 in [facility] Total Score 3  PHQ-9 Total Score 9        B schizophrenic SUI. After M's death. PCP Sherie Dine at [facility] at  73 years old.  Prior psychiatric medication trials include  Lexapro  20, citalopram NR, clomipramine weight gain, paroxetine, fluoxetine, Luvox, Trintellix,   Increase Lexapro  back to 20 mg January 2020. & 10/2020 bupropion ,  Auvelity NR  Abilify  10 fatigue,  Cerefolin NAC, and   Naltrexone  sexual SE Lyrica  150 tired  pramipexole,  ropinirole  Adderall  XR & IR sexual SE,  Ritalin  30, Concerta  72 mg AM NR modafinil  and Nuvigil,   History [employer] OCD  Review of Systems:  Review of Systems  Constitutional:  Positive for fatigue. Negative for fever.  Cardiovascular:  Negative for chest pain and palpitations.  Genitourinary:        ED  Musculoskeletal:  Positive for arthralgias, gait problem and myalgias.  Neurological:  Negative for tremors and weakness.  Psychiatric/Behavioral:  Positive for dysphoric mood and sleep disturbance. Negative for agitation, behavioral problems, confusion, decreased concentration, hallucinations, self-injury and suicidal ideas. The patient is nervous/anxious. The patient is not hyperactive.     Medications: I have reviewed the patient's current medications.  Current Outpatient Medications  Medication Sig Dispense Refill   ALPRAZolam  (XANAX ) 0.25 MG tablet TAKE 1 TABLET (0.25 MG TOTAL) BY MOUTH 2 (TWO) TIMES DAILY AS NEEDED FOR ANXIETY OR SLEEP. 30 tablet 1   aspirin 81 MG tablet Take 81 mg by mouth daily.     Cholecalciferol (VITAMIN D-3) 5000 units TABS Take 5,000 Units by mouth daily.      DULoxetine  (CYMBALTA ) 30 MG capsule Take 3 capsules (90 mg total) by mouth daily. 270 capsule 0   methylphenidate  36 MG PO CR tablet Take 1 tablet (36 mg total) by mouth daily. 30 tablet 0   naltrexone  (DEPADE) 50 MG tablet Take 0.5 tablets (25 mg total) by mouth daily. 45 tablet 1   pregabalin  (LYRICA ) 50 MG capsule Take 1 capsule (50 mg total) by mouth at bedtime. 90 capsule 0   Semaglutide ,0.25 or 0.5MG /DOS, (OZEMPIC , 0.25 OR 0.5 MG/DOSE,) 2 MG/1.5ML SOPN Inject 2.4 mg into the skin once a week.     simvastatin (ZOCOR) 20 MG tablet Take 20 mg by mouth at bedtime.     No current facility-administered medications for this visit.    Medication Side Effects: None sexual SE are better not  All gone.  Allergies:  Allergies  Allergen Reactions   E.E.S. [Erythromycin] Hives   Macrolides And Ketolides Other (See  Comments)    EES    Rosuvastatin     Other reaction(s): cramps    Past Medical History:  Diagnosis Date   Allergy    Anemia    iron- pt denies    Anxiety    Aortic cusp regurgitation    Carotid artery occlusion    Constipation    Coronary artery stenosis    Hyperlipidemia    Lactose intolerance    Major depression, recurrent, chronic (HCC)    Obesity    OCD (obsessive compulsive disorder)    OSA (obstructive sleep apnea)    Other chronic pain    Periodic limb movement disorder    Periodic limb movements of sleep    Prediabetes    Pure hypercholesterolemia    Restless legs    Sleep apnea    wears cpap    Vitamin D deficiency     Family History  Problem Relation Age of Onset   Cancer Mother        breast and ovarian   Anxiety disorder Mother    Breast cancer Mother    Ovarian  cancer Mother    Depression Father        bi-polar   Hyperlipidemia Father    Heart disease Father    Sudden death Father    Bipolar disorder Father    Sleep apnea Father    Obesity Father    Depression Son    Colon cancer Neg Hx    Colon polyps Neg Hx    Esophageal cancer Neg Hx    Rectal cancer Neg Hx    Stomach cancer Neg Hx     Social History   Socioeconomic History   Marital status: Married    Spouse name: Not on file   Number of children: Not on file   Years of education: Not on file   Highest education level: Not on file  Occupational History   Occupation: retired Pensions consultant  Tobacco Use   Smoking status: Never   Smokeless tobacco: Never  Substance and Sexual Activity   Alcohol use: Yes    Comment: occasionally    Drug use: No   Sexual activity: Not on file  Other Topics Concern   Not on file  Social History Narrative   Not on file   Social Drivers of Health   Financial Resource Strain: Not on file  Food Insecurity: No Food Insecurity (01/25/2022)   [facility] from [facility] to [facility]24[facility]22[facility], [facility]  [facility] visits prior to 03/24/2022., [facility]   Hunger Vital Sign    Worried About Running Out of Food in the Last Year: Never true    Ran Out of Food in the Last Year: Never true  Transportation Needs: No Transportation Needs (01/25/2022)   Received from [employer], [facility] visits prior to 03/24/2022., [facility] visits prior to 03/24/2022., [facility]   PRAPARE - Transportation    Lack of Transportation (Medical): No    Lack of Transportation (Non-Medical): No  Physical Activity: Not on file  Stress: Not on file  Social Connections: Not on file  Intimate Partner Violence: Not on file    Past Medical History, Surgical history, Social history, and Family history were reviewed and updated as appropriate.   Please see review of systems for further details on the patient's review from today.   Objective:   Physical Exam:  There were no vitals taken for this visit.  Physical Exam Constitutional:      General: He is not in acute distress.    Appearance: He is obese.  Musculoskeletal:        General: No deformity.  Neurological:     Mental Status: He is alert and oriented to person, place, and time.     Cranial Nerves: No dysarthria.     Coordination: Coordination normal.  Psychiatric:        Attention and Perception: Attention and perception normal. He does not perceive auditory or visual hallucinations.        Mood and Affect: Mood is anxious and depressed. Affect is blunt. Affect is not labile, angry or inappropriate.        Speech: Speech normal.        Behavior: Behavior is slowed. Behavior is cooperative.        Thought Content: Thought content normal. Thought content is not paranoid or delusional. Thought content does not include homicidal or suicidal ideation. Thought content does not include suicidal plan.        Cognition and Memory: Cognition and  memory normal.        Judgment: Judgment normal.     Comments:  Insight intact depression and fatigue ongoing and worse OCD is about the same and not the main problem    November 06, 2018: [place] Cog test in office within normal limits MMSE 28/30. Animal fluency 17 . (borderline) Taken as a whole, no indication to pursue neuropsychological testing.  Mini-Mental status exam 28/30 on 10/27/20.  No evidence of dementia.  Lab Review:   Vitamin D level acceptable at 54.5.   Normal B12 and folate and TSH in last couple of years..  Echocardiogram is stable re: AVR over the last 8 years and not likely the cause of lethargy.  .res Assessment: Plan:    Eric [name] was seen today for follow-up, depression, anxiety, fatigue and add.  Diagnoses and all orders for this visit:  Recurrent major depression resistant to treatment [facility])  Mixed obsessional thoughts and acts  Attention deficit hyperactivity disorder (ADHD), predominantly inattentive type  Hypersomnia with sleep apnea  Restless legs syndrome  Low vitamin D level  Obstructive sleep apnea  Erectile disorder, acquired, generalized, moderate  Mild cognitive impairment    Eric [name] has a long history of depression and OCD.  Major depression with fatigue, cognitive and physical slowness, anhedonia is much worse.  Started Spravato  Was better energy with duloxetine  90 vs Lexapro  so increased to 120 mg daily DT worsening depression.    But it is not better.  Dep is much worse, in fact, his worst depression ever.  Will pursue Spravato  He has some compulsive checking and obsessions around the house maintenance.  When travels then tends to have less OCD bc triggered less.    Patient was administered Spravato 84 mg intranasally today.  The patient experienced the typical dissociation which gradually resolved over the 2-hour period of observation.  There were no complications.  Specifically the patient did not have nausea or vomiting or headache.  Blood pressures remained within normal  ranges at the 40-minute and 2-hour follow-up intervals.  By the time the 2-hour observation period was met the patient was alert and oriented and able to exit without assistance.  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  See nursing note for further details. Emphasized need to relax with Spravato and not return calls, read, return chats, emails etc.  His OCD is persistent with checking lites, stove, etc. .  It is worse at the moment DT a trigger involving his home.  Disc CBT techniques and potential for more therapy to address. Option Dr. Caroleen Churn.  Continue recent retrial Concerta  36 mg AM He wants to use it for depression.  Discussed potential benefits, risks, and side effects of stimulants with patient to include increased heart rate, palpitations, insomnia, increased anxiety, increased irritability, or decreased appetite.  Instructed patient to contact office if experiencing any significant tolerability issues. Disc risk of increasing the Ritalin  further elevating BP and pulse if we increase the Ritalin .  Need to verify that it's not markedly elevated from taking the Ritalin . Doesn't think it's been consistently elevated. Disc crash risk.  He doesn't need feel it causes crash.  Extensive discussion of sleep study and he has a copy.  Why is there virtually no N3 & REM sleep?  Is that affecting daytime alertness and fatigue?  Vs how much is related to mild OSA and PLMS?  Disc this in detail.  Wrote coreespondence  with sleep doc about it.  Options for sleep & fatigue:  Difficult to assess  bc has been out of country and then sick for 3 weeks.   Focus on reduction in OSA Focus on improving deep stage sleep.  ? Rx low dose mirtazapine or alternatives Focus on leg movements (hx RLS/PLMS) continue Trial as had been suggested Lyrica  for FM type sx and may help RLS/PLMS.  Better dreaming on 100 mg HS and too  sleepy on 150.  Decreased  to 100 mg HS for sleep and back pain and RLS No ropinirole  needed.   Disc SE.  Not having any.   Use LED Xanax  and try to avoid BZ daytime bc fatigue.  He doesn't use it much daythime.  Using 0..25-0.5 mg at night.  May need less with more Lyrica  at [facility] and try to minimize.  No hangover. We discussed the short-term risks associated with benzodiazepines including sedation and increased fall risk among others.  Discussed long-term side effect risk including dependence, potential withdrawal symptoms, and the potential eventual dose-related risk of dementia.  But recent studies from 2020 dispute this association between benzodiazepines and dementia risk. Newer studies in 2020 do not support an association with dementia.  Thinks memory problem relate to OCD bc often counting in the in the background at rest which tends to reduce his attention.  Ritalin  is being successfully used off label to augment antidepressants for depression and have resulted in improved productivity and attention.  Previous screening of memory was not suggestive of any neuro degenerative process. Mini-Mental status exam 28/30 on 10/27/20.  No evidence of dementia.  He has lost sig weight on Ozempic . Disc use of this for weight loss.  This will help a number of things including joints.  Discussed potential metabolic side effects associated with atypical antipsychotics, as well as potential risk for movement side effects. Advised pt to contact office if movement side effects occur.   Plan: Meds:  continue duloxetine  to 120 mg daily.  Off Lexapro .  Lyrica  100 mg HS.  resumed MPH ER 36 mg AM stop Vraylar  after brief trial .  But NR and goal minimize unnecessary meds.   Spravato disc in detail.  He wants to pursue .  Administer twice weekly for at least a month.  Will administer at 84 mg .   Follow-up   Nori Beat MD, DFAPA.  Please see After Visit Summary for patient specific instructions.  Future  Appointments  Date Time Provider Department Center  06/28/2023 10:00 AM [facility] MOBILE UNIT [id number] [address] [facility]  07/02/2023  9:00 AM Cottle, Kennedy Peabody., MD [facility] None  07/02/2023  9:00 AM CP-NURSE [facility] None  07/04/2023  9:00 AM Cottle, Kennedy Peabody., MD [facility] None  07/04/2023  9:00 AM CP-NURSE [facility] None  07/04/2023  2:30 PM Arnie Lao, MD [facility] None  07/11/2023  9:00 AM Cottle, Kennedy Peabody., MD [facility] None  07/11/2023  9:00 AM CP-NURSE [facility] None  08/13/2023  1:30 PM Cottle, Kennedy Peabody., MD [facility] None  09/17/2023  1:00 PM Cottle, Kennedy Peabody., MD [facility] None  10/14/2023  2:45 PM Debbra Fairy, MD [facility] None  10/15/2023  1:30 PM Cottle, Kennedy Peabody., MD [facility] None     No orders of the defined types were placed in this encounter.      -------------------------------

## 2023-06-27 NOTE — Progress Notes (Signed)
 NURSES NOTE:         Pt arrived for his 2nd Spravato Treatment for treatment resistant depression, the starting dose was 56 mg (2 of the 28 mg nasal sprays) and he tolerated it well with no side effects or complaints. Today's dose is increased to 84 mg which is maintenance dose and he will continue as long as tolerating it well. Asif is a patient of Dr. Guillermo Lees so he will follow his care throughout treatments and follow ups. Explained to pt how the treatments would be scheduled and answered any questions he had today. His wife did not come back to the room with him today but she will be returning at 11:00 AM. Confirmed pt was comfortable again today with inhaler or needed to practice but he feels he has it down. Pt's Spravato is a medical authorization through buy and bill.  Spravato medication is stored at treatment center per REMS/FDA guidelines. The medication is required to be locked behind two doors per REMS/FDA protocol. Medication is also disposed of properly after each use per regulations. All documentation for REMS is completed and submitted per FDA/REMS requirements.          Began taking patient's vital signs at 9:15 AM 148/86, pulse 72, SpO2 95%. Instructed patient to blow his nose if needed then recline back to a 45 degree angle. Gave patient first dose 28 mg nasal spray, administered in each nostril as directed and observed by nurse, waited 5 more minutes for the second dose. After both doses given pt did not complain of any nausea/vomiting. Assessed his 40 minute vitals, 10:00 AM, 141/80, pulse 72, SpO2 98%. Pt reports doing fine, no complaints voiced  Explained he would be monitored for a total time of 120 minutes. Discharge vitals were taken at 11:02 AM 143/74, P 69, SpO2 98%. Dr. Toi Foster came to visit with patient once his thoughts were clearer to discuss how treatment went. Recommend he go home and sleep or just relax on the couch. No driving, no intense activities. Verbalized  understanding. Pt. will be receiving 2 treatments per week for 4 weeks as recommended. Nurse was with pt a total of 60 minutes for clinical assessment. Pt is scheduled this Tuesday, June 10th. Instructed to call with any issues.     LOT [id number] EXP OCT 2027

## 2023-06-28 ENCOUNTER — Ambulatory Visit
Admission: RE | Admit: 2023-06-28 | Discharge: 2023-06-28 | Disposition: A | Discharge status: Home / Self Care | Source: Ambulatory Visit | Attending: Family Medicine | Admitting: Family Medicine

## 2023-06-28 DIAGNOSIS — R258 Other abnormal involuntary movements: Secondary | ICD-10-CM

## 2023-06-28 DIAGNOSIS — R2689 Other abnormalities of gait and mobility: Secondary | ICD-10-CM | POA: Diagnosis not present

## 2023-06-28 DIAGNOSIS — R9089 Other abnormal findings on diagnostic imaging of central nervous system: Secondary | ICD-10-CM | POA: Diagnosis not present

## 2023-06-28 DIAGNOSIS — I6782 Cerebral ischemia: Secondary | ICD-10-CM | POA: Diagnosis not present

## 2023-07-01 DIAGNOSIS — I351 Nonrheumatic aortic (valve) insufficiency: Secondary | ICD-10-CM | POA: Diagnosis not present

## 2023-07-01 DIAGNOSIS — Z7722 Contact with and (suspected) exposure to environmental tobacco smoke (acute) (chronic): Secondary | ICD-10-CM | POA: Diagnosis not present

## 2023-07-01 DIAGNOSIS — I77810 Thoracic aortic ectasia: Secondary | ICD-10-CM | POA: Diagnosis not present

## 2023-07-01 DIAGNOSIS — Q2381 Bicuspid aortic valve: Secondary | ICD-10-CM | POA: Diagnosis not present

## 2023-07-01 DIAGNOSIS — G629 Polyneuropathy, unspecified: Secondary | ICD-10-CM | POA: Diagnosis not present

## 2023-07-01 DIAGNOSIS — Z09 Encounter for follow-up examination after completed treatment for conditions other than malignant neoplasm: Secondary | ICD-10-CM | POA: Diagnosis not present

## 2023-07-01 DIAGNOSIS — I7121 Aneurysm of the ascending aorta, without rupture: Secondary | ICD-10-CM | POA: Diagnosis not present

## 2023-07-02 ENCOUNTER — Encounter: Payer: Self-pay | Admitting: Psychiatry

## 2023-07-02 ENCOUNTER — Ambulatory Visit: Admitting: Psychiatry

## 2023-07-02 ENCOUNTER — Ambulatory Visit

## 2023-07-02 VITALS — BP 139/76 | HR 68

## 2023-07-02 DIAGNOSIS — F339 Major depressive disorder, recurrent, unspecified: Secondary | ICD-10-CM | POA: Diagnosis not present

## 2023-07-02 DIAGNOSIS — F422 Mixed obsessional thoughts and acts: Secondary | ICD-10-CM

## 2023-07-02 DIAGNOSIS — F5221 Male erectile disorder: Secondary | ICD-10-CM

## 2023-07-02 DIAGNOSIS — G3184 Mild cognitive impairment, so stated: Secondary | ICD-10-CM

## 2023-07-02 DIAGNOSIS — G4733 Obstructive sleep apnea (adult) (pediatric): Secondary | ICD-10-CM

## 2023-07-02 DIAGNOSIS — F900 Attention-deficit hyperactivity disorder, predominantly inattentive type: Secondary | ICD-10-CM

## 2023-07-02 DIAGNOSIS — G4710 Hypersomnia, unspecified: Secondary | ICD-10-CM

## 2023-07-02 DIAGNOSIS — R7989 Other specified abnormal findings of blood chemistry: Secondary | ICD-10-CM

## 2023-07-02 NOTE — Progress Notes (Addendum)
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   Sometimes poor sleep. Plan  no changes  01/21/23 appt noted: Worrying too much bc OCD.  Trying to decide about how to deal with a trigger lately.  OCD driving me crazy wanting to make things perfect.   Other than the trigger had a nice time since here.  GS here for 5 days at 73 yo.  Enjoyed that.   Taking semaglutide   down to 250# from 283#. Card at [facility] says he does not have Aortic aneurysm.  Measuring is OK.  Will FU with MRI in June.   Some diarrhea lately. Cannot stop worrying.  Triggered by the plumbing issue. Meds as above.  No SE of sig.   Plan:  switched to duloxetine  to 90 mg daily.  Off Lexapro .  Others as noted. Lyrica  100 mg HS. Has held Ritalin  10 TID, can resume as long as SBP below 140 per card.  none needed ropinirole  3 mg pm.  No risperidone .  02/18/23 appt noted:  with Eric [name] Meds: as above. Taking Ritalin  30% of night.  Prn Xanax  rarely if gets off sleep cycle. No SE.   "Terribly dep" but not as bad as in the past. Taking Ritalin  appears to help significantly and started doing that.  Tend to stay in bed till noon but pattern of up late.   OCD about the same with some avoidance of detail work.   Afraid I'll throw away something I need.   She doesn't know whether Ritalin  helps No sig RLS and wife agrees. Thinks of deceased B at the holidays but worries over son Donavon Fudge too.   Plan: Increase duloxetine  to 120 mg daily.  Off Lexapro .  Others as noted. Lyrica  100 mg HS.  resumed Ritalin  30 AM  with some benefit. no risperidone .  03/19/23 appt noted: Psych med:  duloxetine  120, Ritalin  20 am  missing doses, Lyrica  100 HS, no risperidone , no ropinirole .   No improvement in depression since increase dose duloxetine .  More dep than usual.  Worrying about everything.  Including taxes.  All I can see is a black hole.  Making him feel negative about everything.   Keeping him from doing things.   OCD is not much different from when on Lexapro .  Wife agrees dep and distracted. Plan: resumed Ritalin  30 AM with some benefit. Resume Abilify  2 mg AM for dep.  04/16/23 appt noted:  wife here Psych med:  duloxetine  120, resumed Concerta  36 mg AM, Lyrica  100 HS, Abilify  2, no ropinirole .   Wife noted he seemed manic yesterday.  Invited window estimate against wife's will and without her input.   No mania noted until yesterday. He didn't feel manic yesterday.   He's noticed no change with Abilify  and still feels flat and a little low.  From MPH energy and mood a little better.  Sleeps until 10-11 am about 12 hours. And asleep that whole time. Wife says he doesn't eat much bc he's not awake much. Trouble with hygiene.   Plan: Meds: continue duloxetine  to 120 mg daily.  Off Lexapro .  Lyrica  100 mg HS.  resumed Ritalin  30 AM with some benefit. DC Abiilify Vraylar  1.5 mg every other day Spravato disc in detail.  He wants to pursue if not better with Vraylar .  06/18/23 appt noted: with W Med:  duloxetine  120, resumed Concerta  36 mg AM, Lyrica  100 HS, no ropinirole .   Vraylar  1.5 every other day, no benefit Spravato denied by insurance but is being appealed. More trouble walking and shorter gait.  Neuro  in [place] appt in Sept.   Looking to get in in [place].  Can't be much more dep than I am.   OCD residual when has to make a decision.   ED is more of a px. Reduced voice family. Plan : Spravato  06/25/23 appt noted: with W Med:  duloxetine  120, Concerta  36 mg AM, Lyrica  100 HS, no ropinirole .   Vraylar  1.5 daily No SE Received Spravato 56 mg today.  Experienced very mild dissociation and no sig HA, N.  But some dizziness.  Resolved by end of 2 hours observation.  Able to leave office without assistance.  Ongoing dep without change.  Slow, reduced cognition, anhedonia, forgetful, low motivation. No other concerns with meds.   06/27/23 appt noted:  Med: Med:  duloxetine  120, Concerta  36 mg AM, Lyrica  100 HS, no ropinirole .   Stopped Vraylar  1.5 daily No SE Received Spravato 84 mg todayfor the first time..  Experienced very mild dissociation and no sig HA, N.  But some dizziness.  Resolved by end of 2 hours observation.  Able to leave office without assistance.  Ongoing dep without change.  Slow, reduced cognition, anhedonia, forgetful, low motivation. No other concerns with meds.   07/02/23 appt oted: Med: Med:  duloxetine  90, Concerta  36 mg AM, Lyrica  100 HS, no ropinirole .   Stopped Vraylar  1.5 daily No SE Received Spravato 84 mg todayfor the first time..  Experienced very mild dissociation and no sig HA, N.  But some dizziness.  Resolved by end of 2 hours observation.  Mildly drowsy at end of session. BC of gait px he needed to use WC to leave the office.  Per wife gait gradually worsened over a couple of years  but accelerated recently in 3 mos or so.  Short gait.  Not due to pain now. Pending neuro appt.  MRI last week not read yet. No problems with meds. Wife noticed he's shown more initiative at home since [place] ex doing crossword puzzles.  More engaged.  He feels lighter already with it.  ECT-MADRS    Flowsheet Row Office Visit from 06/18/2023 in [facility]  Office Visit from 04/16/2023 in [facility]  MADRS Total Score 37 36      PHQ2-9    Flowsheet Row Office Visit from 03/15/2020 in [facility] Total Score 3  PHQ-9 Total Score 9        B schizophrenic SUI. After M's death. PCP Sherie Dine at [place] [facility] at 73 years old.  Prior psychiatric medication trials include  Lexapro  20, citalopram NR, clomipramine weight gain, paroxetine, fluoxetine, Luvox, Trintellix,   Increase Lexapro  back to 20 mg January 2020. & 10/2020 bupropion ,  Auvelity NR  Abilify  10 fatigue,  Cerefolin NAC, and   Naltrexone  sexual SE Lyrica  150 tired  pramipexole,  ropinirole  Adderall XR & IR sexual SE,  Ritalin  30, Concerta  72 mg AM NR modafinil  and Nuvigil,   History [employer] OCD  Review of Systems:  Review of Systems  Constitutional:  Positive for fatigue. Negative for fever.  Cardiovascular:  Negative for chest pain and palpitations.  Genitourinary:        ED  Musculoskeletal:  Positive for arthralgias, gait problem and myalgias.  Neurological:  Negative for tremors and weakness.  Psychiatric/Behavioral:  Positive for dysphoric mood and sleep disturbance. Negative for agitation, behavioral problems, confusion, decreased concentration, hallucinations, self-injury and suicidal ideas. The patient is nervous/anxious. The patient is not hyperactive.     Medications: I have reviewed the patient's current medications.  Current Outpatient Medications  Medication Sig Dispense Refill   ALPRAZolam  (XANAX ) 0.25 MG tablet TAKE 1 TABLET (0.25 MG TOTAL) BY MOUTH 2 (TWO) TIMES DAILY AS NEEDED FOR ANXIETY OR SLEEP. 30 tablet 1   aspirin 81 MG tablet Take 81 mg by mouth daily.     Cholecalciferol (VITAMIN D-3) 5000 units TABS Take 5,000 Units by mouth daily.      DULoxetine  (CYMBALTA ) 30 MG capsule Take 3 capsules (90 mg total) by mouth daily. 270 capsule 0    methylphenidate  36 MG PO CR tablet Take 1 tablet (36 mg total) by mouth daily. 30 tablet 0   naltrexone  (DEPADE) 50 MG tablet Take 0.5 tablets (25 mg total) by mouth daily. 45 tablet 1   pregabalin  (LYRICA ) 50 MG capsule Take 1 capsule (50 mg total) by mouth at bedtime. 90 capsule 0   Semaglutide ,0.25 or 0.5MG /DOS, (OZEMPIC , 0.25 OR 0.5 MG/DOSE,) 2 MG/1.5ML SOPN Inject 2.4 mg into the skin once a week.     simvastatin (ZOCOR) 20 MG tablet Take 20 mg by mouth at bedtime.     No current facility-administered medications for this visit.    Medication Side Effects: None sexual SE are better not  All gone.  Allergies:  Allergies  Allergen Reactions   E.E.S. [Erythromycin] Hives   Macrolides And Ketolides Other (See Comments)    EES    Rosuvastatin     Other reaction(s): cramps    Past Medical History:  Diagnosis Date   Allergy    Anemia    iron- pt denies    Anxiety    Aortic cusp  regurgitation    Carotid artery occlusion    Constipation    Coronary artery stenosis    Hyperlipidemia    Lactose intolerance    Major depression, recurrent, chronic (HCC)    Obesity    OCD (obsessive compulsive disorder)    OSA (obstructive sleep apnea)    Other chronic pain    Periodic limb movement disorder    Periodic limb movements of sleep    Prediabetes    Pure hypercholesterolemia    Restless legs    Sleep apnea    wears cpap    Vitamin D deficiency     Family History  Problem Relation Age of Onset   Cancer Mother        breast and ovarian   Anxiety disorder Mother    Breast cancer Mother    Ovarian cancer Mother    Depression Father        bi-polar   Hyperlipidemia Father    Heart disease Father    Sudden death Father    Bipolar disorder Father    Sleep apnea Father    Obesity Father    Depression Son    Colon cancer Neg Hx    Colon polyps Neg Hx    Esophageal cancer Neg Hx    Rectal cancer Neg Hx    Stomach cancer Neg Hx     Social History   Socioeconomic  History   Marital status: Married    Spouse name: Not on file   Number of children: Not on file   Years of education: Not on file   Highest education level: Not on file  Occupational History   Occupation: retired Pensions consultant  Tobacco Use   Smoking status: Never   Smokeless tobacco: Never  Substance and Sexual Activity   Alcohol use: Yes    Comment: occasionally    Drug use: No   Sexual activity: Not on file  Other Topics Concern   Not on file  Social History Narrative   Not on file   Social Drivers of Health   Financial Resource Strain: Not on file  Food Insecurity: No Food Insecurity (01/25/2022)   [facility] to [facility], [facility] visits prior to 03/24/2022., [facility]   Hunger Vital Sign    Worried About Running Out of Food in the Last Year: Never true    Ran Out of Food in the Last Year: Never true  Transportation Needs: No Transportation Needs (01/25/2022)   Received from [employer], [facility] visits prior to 03/24/2022., [facility] visits prior to 03/24/2022., [facility]   PRAPARE - Transportation    Lack of Transportation (Medical): No    Lack of Transportation (Non-Medical): No  Physical Activity: Not on file  Stress: Not on file  Social Connections: Not on file  Intimate Partner Violence: Not on file    Past Medical History, Surgical history, Social history, and Family history were reviewed and updated as appropriate.   Please see review of systems for further details on the patient's review from today.   Objective:   Physical Exam:  There were no vitals taken for this visit.  Physical Exam Constitutional:      General: He is not in acute distress.    Appearance: He is obese.  Musculoskeletal:        General: No deformity.  Neurological:     Mental Status: He  is alert and oriented to person, place, and time.     Cranial  Nerves: No dysarthria.     Comments: Shortened gait.  No sig tremor  Psychiatric:        Attention and Perception: Attention and perception normal. He does not perceive auditory or visual hallucinations.        Mood and Affect: Mood is anxious and depressed. Affect is blunt. Affect is not labile, angry or inappropriate.        Speech: Speech normal.        Behavior: Behavior is slowed. Behavior is cooperative.        Thought Content: Thought content normal. Thought content is not paranoid or delusional. Thought content does not include homicidal or suicidal ideation. Thought content does not include suicidal plan.        Cognition and Memory: Cognition and memory normal.        Judgment: Judgment normal.     Comments: Insight intact depression and fatigue ongoing and worse until starting Spravato and already starting to improve OCD is about the same and not the main problem    November 06, 2018: [place] Cog test in office within normal limits MMSE 28/30. Animal fluency 17 . (borderline) Taken as a whole, no indication to pursue neuropsychological testing.  Mini-Mental status exam 28/30 on 10/27/20.  No evidence of dementia.  Lab Review:   Vitamin D level acceptable at 54.5.   Normal B12 and folate and TSH in last couple of years..  Echocardiogram is stable re: AVR over the last 8 years and not likely the cause of lethargy.  .res Assessment: Plan:    Loyd was seen today for follow-up, depression, anxiety, fatigue and memory loss.  Diagnoses and all orders for this visit:  Recurrent major depression resistant to treatment [facility])  Mixed obsessional thoughts and acts  Attention deficit hyperactivity disorder (ADHD), predominantly inattentive type  Hypersomnia with sleep apnea  Low vitamin D level  Obstructive sleep apnea  Erectile disorder, acquired, generalized, moderate  Mild cognitive impairment     Mr. Vonderhaar has a long history of depression and OCD.   Major depression with fatigue, cognitive and physical slowness, anhedonia is much worse.  Started Spravato  Was better energy with duloxetine  90 vs Lexapro  so increased to 120 mg daily DT worsening depression.    But it is not better. So reduced it back to 90 mg daily. Dep is much worse, in fact, his worst depression ever. pursue Spravato  He has some compulsive checking and obsessions around the house maintenance.  When travels then tends to have less OCD bc triggered less.    Patient was administered Spravato 84 mg intranasally today.  The patient experienced the typical dissociation which gradually resolved over the 2-hour period of observation.  There were no complications.  Specifically the patient did not have nausea or vomiting or headache.  Blood pressures remained within normal ranges at the 40-minute and 2-hour follow-up intervals.  By the time the 2-hour observation period was met the patient was alert and oriented and able to exit without assistance.  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  See nursing note for further details. Emphasized need to relax with Spravato and not return calls, read, return chats, emails etc.  His OCD is persistent with checking lites, stove, etc. .  It is worse at the moment DT a trigger involving his home.  Disc  CBT techniques and potential for more therapy to address. Option Dr. Caroleen Churn.  Continue recent retrial Concerta  36 mg AM He wants to use it for depression.  Discussed potential benefits, risks, and side effects of stimulants with patient to include increased heart rate, palpitations, insomnia, increased anxiety, increased irritability, or decreased appetite.  Instructed patient to contact office if experiencing any significant tolerability issues.  Extensive discussion of sleep study and he has a copy.  Why is there virtually no N3 & REM sleep?  Is that  affecting daytime alertness and fatigue?  Vs how much is related to mild OSA and PLMS?  Disc this in detail.  Wrote coreespondence with sleep doc about it.  Options for sleep & fatigue:  Difficult to assess  bc has been out of country and then sick for 3 weeks.   Focus on reduction in OSA Focus on improving deep stage sleep.  ? Rx low dose mirtazapine or alternatives Focus on leg movements (hx RLS/PLMS) continue Trial as had been suggested Lyrica  for FM type sx and may help RLS/PLMS.  Better dreaming on 100 mg HS and too sleepy on 150.  Decreased  to 100 mg HS for sleep and back pain and RLS No ropinirole  needed.   Disc SE.  Not having any.   Use LED Xanax  and try to avoid BZ daytime bc fatigue.  He doesn't use it much daythime.  Using 0..25-0.5 mg at night.  May need less with more Lyrica  at HS and try to minimize.  No hangover. We discussed the short-term risks associated with benzodiazepines including sedation and increased fall risk among others.  Discussed long-term side effect risk including dependence, potential withdrawal symptoms, and the potential eventual dose-related risk of dementia.  But recent studies from 2020 dispute this association between benzodiazepines and dementia risk. Newer studies in 2020 do not support an association with dementia.  Stimulant partially successfully used off label to augment antidepressants for depression and have resulted in improved productivity and attention.    Previous screening of memory was not suggestive of any neuro degenerative process. Mini-Mental status exam 28/30 on 10/27/20.   Recent cognition worse as dep worsened.  Also gait is short and slow.  Pending neuro appt.  MRI completed 06/28/23 awaiting report.  Plan: Meds:  continue duloxetine  to 120 mg daily.  Off Lexapro .  Lyrica  100 mg HS.  resumed MPH ER 36 mg AM   Spravato disc in detail.  He wants to continue.  He and wife notice he's brigher .  Administer twice weekly for at least a  month.  Will administer at 84 mg .   Follow-up   Nori Beat MD, DFAPA.  Please see After Visit Summary for patient specific instructions.  Future Appointments  Date Time Provider Department Center  07/04/2023  9:00 AM Cottle, Kennedy Peabody., MD [facility] None  07/04/2023  9:00 AM CP-NURSE [facility] None  07/04/2023  2:30 PM Arnie Lao, MD [facility] None  07/11/2023  9:00 AM Cottle, Kennedy Peabody., MD [facility] None  07/11/2023  9:00 AM CP-NURSE [facility] None  08/13/2023  1:30 PM Cottle, Kennedy Peabody., MD [facility] None  09/17/2023  1:00 PM Cottle, Kennedy Peabody., MD [facility] None  10/14/2023  2:45 PM Debbra Fairy, MD [facility] None  10/15/2023  1:30 PM Cottle, Kennedy Peabody., MD [facility] None     No orders of the defined types were placed in this encounter.      -------------------------------

## 2023-07-02 NOTE — Progress Notes (Signed)
 NURSES NOTE:         Pt arrived for his 3rd Spravato Treatment for treatment resistant depression, the starting dose was 56 mg (2 of the 28 mg nasal sprays) and he tolerated it well with no side effects or complaints. Pt reports no issues after his last treatment so he will continue 84 mg which is maintenance dose and he will continue as long as tolerating it well. Aseel is a patient of Dr. Guillermo Lees so he will follow his care throughout treatments and follow ups. Explained to pt how the treatments would be scheduled and answered any questions he had today. His wife did not come back to the room with him today but she will be returning at 11:00 AM. Confirmed pt was comfortable again today with inhaler or needed to practice but he feels he has it down. Pt's Spravato is a medical authorization through buy and bill.  Spravato medication is stored at treatment center per REMS/FDA guidelines. The medication is required to be locked behind two doors per REMS/FDA protocol. Medication is also disposed of properly after each use per regulations. All documentation for REMS is completed and submitted per FDA/REMS requirements.          Began taking patient's vital signs at 9:05 AM 156/78, pulse 75, SpO2 95%. Instructed patient to blow his nose if needed then recline back to a 45 degree angle. Gave patient first dose 28 mg nasal spray, administered in each nostril as directed and observed by nurse, waited 5 more minutes for the second dose. After both doses given pt did not complain of any nausea/vomiting. Assessed his 40 minute vitals, 9:45 AM, 152/77, pulse 72, SpO2 93%. Pt reports doing fine, no complaints voiced  Explained he would be monitored for a total time of 120 minutes. Discharge vitals were taken at 11:02 AM 139/76, P 68, SpO2 96%. Dr. Toi Foster came to visit with patient once his thoughts were clearer to discuss how treatment went. Recommend he go home and sleep or just relax on the couch. No driving, no  intense activities. Verbalized understanding. Pt. will be receiving 2 treatments per week for 4 weeks as recommended. Nurse was with pt a total of 60 minutes for clinical assessment. Pt is scheduled this Thursday, June 12th. Instructed to call with any issues.     LOT [id number] EXP OCT 2027

## 2023-07-03 ENCOUNTER — Encounter: Payer: Self-pay | Admitting: Neurology

## 2023-07-03 DIAGNOSIS — R258 Other abnormal involuntary movements: Secondary | ICD-10-CM | POA: Diagnosis not present

## 2023-07-04 ENCOUNTER — Ambulatory Visit: Admitting: Orthopaedic Surgery

## 2023-07-04 ENCOUNTER — Ambulatory Visit: Admitting: Psychiatry

## 2023-07-04 ENCOUNTER — Encounter: Payer: Self-pay | Admitting: Psychiatry

## 2023-07-04 ENCOUNTER — Ambulatory Visit

## 2023-07-04 VITALS — Ht 70.0 in | Wt 250.0 lb

## 2023-07-04 VITALS — BP 143/79 | HR 67

## 2023-07-04 DIAGNOSIS — R7989 Other specified abnormal findings of blood chemistry: Secondary | ICD-10-CM

## 2023-07-04 DIAGNOSIS — G4733 Obstructive sleep apnea (adult) (pediatric): Secondary | ICD-10-CM

## 2023-07-04 DIAGNOSIS — G4710 Hypersomnia, unspecified: Secondary | ICD-10-CM

## 2023-07-04 DIAGNOSIS — F339 Major depressive disorder, recurrent, unspecified: Secondary | ICD-10-CM | POA: Diagnosis not present

## 2023-07-04 DIAGNOSIS — G2581 Restless legs syndrome: Secondary | ICD-10-CM

## 2023-07-04 DIAGNOSIS — M25552 Pain in left hip: Secondary | ICD-10-CM | POA: Diagnosis not present

## 2023-07-04 DIAGNOSIS — F900 Attention-deficit hyperactivity disorder, predominantly inattentive type: Secondary | ICD-10-CM

## 2023-07-04 DIAGNOSIS — M25551 Pain in right hip: Secondary | ICD-10-CM

## 2023-07-04 DIAGNOSIS — G3184 Mild cognitive impairment, so stated: Secondary | ICD-10-CM

## 2023-07-04 DIAGNOSIS — F422 Mixed obsessional thoughts and acts: Secondary | ICD-10-CM

## 2023-07-04 NOTE — Progress Notes (Signed)
 NURSES NOTE:         Pt arrived for his 4th Spravato Treatment for treatment resistant depression, the starting dose was 56 mg (2 of the 28 mg nasal sprays) and he tolerated it well with no side effects or complaints. Pt reports no issues after his last treatment so he will continue 84 mg which is maintenance dose and he will continue as long as tolerating it well. Eric [name] is a patient of Dr. Guillermo Lees so he will follow his care throughout treatments and follow ups. Explained to pt how the treatments would be scheduled and answered any questions he had today. His wife did not come back to the room with him today but she will be returning at 11:00 AM. Confirmed pt was comfortable again today with inhaler or needed to practice but he feels he has it down. Pt's Spravato is a medical authorization through buy and bill.  Spravato medication is stored at treatment center per REMS/FDA guidelines. The medication is required to be locked behind two doors per REMS/FDA protocol. Medication is also disposed of properly after each use per regulations. All documentation for REMS is completed and submitted per FDA/REMS requirements.          Began taking patient's vital signs at 9:08 AM 158/80, pulse 64, SpO2 98%. Instructed patient to blow his nose if needed then recline back to a 45 degree angle. Gave patient first dose 28 mg nasal spray, administered in each nostril as directed and observed by nurse, waited 5 more minutes for the second dose. After both doses given pt did not complain of any nausea/vomiting. Assessed his 40 minute vitals, 10:00 AM, 142/73, pulse 65, SpO2 91%. Pt reports doing fine, no complaints voiced  Explained he would be monitored for a total time of 120 minutes. Discharge vitals were taken at 11:01 AM 143/79, P 67, SpO2 96%. Dr. Toi Foster came to visit with patient once his thoughts were clearer to discuss how treatment went. Recommend he go home and sleep or just relax on the couch. No driving, no  intense activities. Verbalized understanding. Pt. will be receiving 2 treatments per week for 4 weeks as recommended. Nurse was with pt a total of 60 minutes for clinical assessment. Pt is scheduled  Thursday, June 19th, he will be on vacation on Tuesday and unable to come in. Instructed to call with any issues.     LOT [id number] EXP OCT 2027

## 2023-07-04 NOTE — Progress Notes (Signed)
 The patient is a very pleasant 73 year old gentleman I am seeing for the first time.  He is seen Dr. Alease Hunter from sports medicine and he has had injections over the trochanteric area of both of his hips.  That has relieved his pain some.  He also has had injections over both areas.  He does ambulate with a cane.  He states that his biggest complaint is a shuffling gait.  He has also had physical therapy he states.  He denies any groin pain and points to the lateral aspect of his hips as a source of his pain but today he says the pain is not significant.  He is on Lyrica  and Cymbalta  and he says these are for OCD.  He also deals with depression.  His significant other is with him today as well.  On my exam both hips move smoothly and fluidly with no blocks to rotation at all.  There is some mild pain to palpation over the trochanteric area of his hips with the left worse than the right.  I did review x-rays on the canopy system of his pelvis and both hips.  There is no significant arthritis in either hip joint at all.  There are no cortical irregularities around the hips either.  He does see a neurologist next week for the first time and and from what I ascertain this may be due to Parkinson's disease.  That is what is really probably affected his shuffling gait.  He understands from orthopedic standpoint there is nothing we can do to correct that.  I have recommended Voltaren gel at least to try as a topical anti-inflammatory over the trochanteric areas of both hips.  All questions and concerns were answered addressed.  Follow-up can be as needed.

## 2023-07-04 NOTE — Progress Notes (Signed)
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 11 hours and often naps.   Sometimes poor sleep. Plan  no changes  01/21/23 appt noted: Worrying too much bc OCD.  Trying to decide about how to deal with a trigger lately.  OCD driving me crazy wanting to make things perfect.   Other than the trigger had a nice time since here.  GS here for 5 days at 73 yo.  Enjoyed that.   Taking semaglutide   down to 250# from 283#. Card at [facility] says he does not have Aortic aneurysm.  Measuring is OK.  Will FU with MRI in June.   Some diarrhea lately. Cannot stop worrying.  Triggered by the plumbing issue. Meds as above.  No SE of sig.   Plan:  switched to duloxetine  to 90 mg daily.  Off Lexapro .  Others as noted. Lyrica  100 mg HS. Has held Ritalin  10 TID, can resume as long as SBP below 140 per card.  none needed ropinirole  3 mg pm.  No risperidone .  02/18/23 appt noted:  with Adriana Hopping Meds: as above. Taking Ritalin  30% of night.  Prn Xanax  rarely if gets off sleep cycle. No SE.   Terribly dep but not as bad as in the past. Taking Ritalin  appears to help significantly and started doing that.  Tend to stay in bed till noon but pattern of up late.   OCD about the same with some avoidance of detail work.   Afraid I'll throw away something I need.   She doesn't know whether Ritalin  helps No sig RLS and wife agrees. Thinks of deceased B at the holidays but worries over son Donavon Fudge too.   Plan: Increase duloxetine  to 120 mg daily.  Off Lexapro .  Others as noted. Lyrica  100 mg HS.  resumed Ritalin  30 AM with some benefit. no risperidone .  03/19/23 appt noted: Psych med:  duloxetine  120, Ritalin  20 am  missing doses, Lyrica  100 HS, no risperidone , no ropinirole .   No improvement in depression since increase dose duloxetine .  More dep than usual.  Worrying about everything.  Including taxes.  All I can see is a black hole.  Making him feel negative about everything.   Keeping him from doing things.   OCD is not much different from when on Lexapro .  Wife agrees dep and distracted. Plan: resumed Ritalin  30 AM with some benefit. Resume Abilify  2 mg AM for dep.  04/16/23 appt noted:  wife here Psych med:  duloxetine  120, resumed Concerta  36 mg AM, Lyrica  100 HS, Abilify  2, no ropinirole .   Wife noted he seemed manic yesterday.  Invited window estimate against wife's will and without her input.   No mania noted until yesterday. He didn't feel manic yesterday.   He's noticed no change with Abilify  and still feels flat and a little low.  From MPH energy and mood a little better.  Sleeps until 10-11 am about 12 hours. And asleep that whole time. Wife says he doesn't eat much bc he's not awake much. Trouble with hygiene.   Plan: Meds: continue duloxetine  to 120 mg daily.  Off Lexapro .  Lyrica  100 mg HS.  resumed Ritalin  30 AM with some benefit. DC Abiilify Vraylar  1.5 mg every other day Spravato disc in detail.  He wants to pursue if not better with Vraylar .  06/18/23 appt noted: with W Med:  duloxetine  120, resumed Concerta  36 mg AM, Lyrica  100 HS, no ropinirole .   Vraylar  1.5 every other day, no benefit Spravato denied by insurance but is being appealed. More trouble walking and shorter gait.  Neuro  in [place] appt in Sept.   Looking to get in in [place].  Can't be much more dep than I am.   OCD residual when has to make a decision.   ED is more of a px. Reduced voice family. Plan : Spravato  06/25/23 appt noted: with W Med:  duloxetine  120, Concerta  36 mg AM, Lyrica  100 HS, no ropinirole .   Vraylar  1.5 daily No SE Received Spravato 56 mg today.  Experienced very mild dissociation and no sig HA, N.  But some dizziness.  Resolved by end of 2 hours observation.  Able to leave office without assistance.  Ongoing dep without change.  Slow, reduced cognition, anhedonia, forgetful, low motivation. No other concerns with meds.   06/27/23 appt noted:  Med: Med:  duloxetine  120, Concerta  36 mg AM, Lyrica  100 HS, no ropinirole .   Stopped Vraylar  1.5 daily No SE Received Spravato 84 mg todayfor the first time..  Experienced very mild dissociation and no sig HA, N.  But some dizziness.  Resolved by end of 2 hours observation.  Able to leave office without assistance.  Ongoing dep without change.  Slow, reduced cognition, anhedonia, forgetful, low motivation. No other concerns with meds.   07/02/23 appt oted: Med: Med:  duloxetine  90, Concerta  36 mg AM, Lyrica  100 HS, no ropinirole .   Stopped Vraylar  1.5 daily No SE Received Spravato 84 mg todayfor the first time..  Experienced very mild dissociation and no sig HA, N.  But some dizziness.  Resolved by end of 2 hours observation.  Mildly drowsy at end of session. BC of gait px he needed to use WC to leave the office.  Per wife gait gradually worsened over a couple of years but accelerated recently in 3 mos  or so.  Short gait.  Not due to pain now. Pending neuro appt.  MRI last week not read yet. No problems with meds. Wife noticed he's shown more initiative at home since [place] ex doing crossword puzzles.  More engaged.  He feels lighter already with it.  ECT-MADRS    Flowsheet Row Office Visit from 06/18/2023 in [facility]  Office Visit from 04/16/2023 in [facility]  MADRS Total Score 37 36   PHQ2-9    Flowsheet Row Office Visit from 03/15/2020 in [facility] Total Score 3  PHQ-9 Total Score 9     B schizophrenic SUI. After M's death. PCP Sherie Dine at [facility] at 73 years old.  Prior psychiatric medication trials include  Lexapro  20, citalopram NR, clomipramine weight gain, paroxetine, fluoxetine, Luvox, Trintellix,   Increase Lexapro  back to 20 mg January 2020. & 10/2020 bupropion ,  Auvelity NR  Abilify  10 fatigue,  Cerefolin NAC, and   Naltrexone  sexual SE Lyrica  150 tired  pramipexole,  ropinirole  Adderall XR & IR sexual SE,  Ritalin  30, Concerta  72 mg AM NR modafinil  and Nuvigil,   History [employer] OCD  Review of Systems:  Review of Systems  Constitutional:  Positive for fatigue. Negative for fever.  Cardiovascular:  Negative for chest pain and palpitations.  Genitourinary:        ED  Musculoskeletal:  Positive for arthralgias, gait problem and myalgias.  Neurological:  Negative for tremors and weakness.  Psychiatric/Behavioral:  Positive for dysphoric mood and sleep disturbance. Negative for agitation, behavioral problems, confusion, decreased concentration, hallucinations, self-injury and suicidal ideas. The patient is nervous/anxious. The patient is not hyperactive.     Medications: I have reviewed the patient's current medications.  Current Outpatient Medications  Medication Sig Dispense Refill   ALPRAZolam  (XANAX ) 0.25 MG tablet TAKE 1 TABLET (0.25 MG TOTAL) BY MOUTH 2 (TWO) TIMES DAILY AS NEEDED FOR ANXIETY OR SLEEP. 30 tablet 1   aspirin 81 MG tablet Take 81 mg by mouth daily.     Cholecalciferol (VITAMIN D-3) 5000 units TABS Take 5,000 Units by mouth daily.      DULoxetine  (CYMBALTA ) 30 MG capsule Take 3 capsules (90 mg total) by mouth daily. 270 capsule 0    methylphenidate  36 MG PO CR tablet Take 1 tablet (36 mg total) by mouth daily. 30 tablet 0   naltrexone  (DEPADE) 50 MG tablet Take 0.5 tablets (25 mg total) by mouth daily. 45 tablet 1   pregabalin  (LYRICA ) 50 MG capsule Take 1 capsule (50 mg total) by mouth at bedtime. 90 capsule 0   Semaglutide ,0.25 or 0.5MG /DOS, (OZEMPIC , 0.25 OR 0.5 MG/DOSE,) 2 MG/1.5ML SOPN Inject 2.4 mg into the skin once a week.     simvastatin (ZOCOR) 20 MG tablet Take 20 mg by mouth at bedtime.     No current facility-administered medications for this visit.    Medication Side Effects: None sexual SE are better not  All gone.  Allergies:  Allergies  Allergen Reactions   E.E.S. [Erythromycin] Hives   Macrolides And Ketolides Other (See Comments)    EES    Rosuvastatin     Other reaction(s): cramps    Past Medical History:  Diagnosis Date   Allergy    Anemia    iron- pt denies    Anxiety    Aortic cusp regurgitation    Carotid artery occlusion    Constipation  Coronary artery stenosis    Hyperlipidemia    Lactose intolerance    Major depression, recurrent, chronic (HCC)    Obesity    OCD (obsessive compulsive disorder)    OSA (obstructive sleep apnea)    Other chronic pain    Periodic limb movement disorder    Periodic limb movements of sleep    Prediabetes    Pure hypercholesterolemia    Restless legs    Sleep apnea    wears cpap    Vitamin D deficiency     Family History  Problem Relation Age of Onset   Cancer Mother        breast and ovarian   Anxiety disorder Mother    Breast cancer Mother    Ovarian cancer Mother    Depression Father        bi-polar   Hyperlipidemia Father    Heart disease Father    Sudden death Father    Bipolar disorder Father    Sleep apnea Father    Obesity Father    Depression Son    Colon cancer Neg Hx    Colon polyps Neg Hx    Esophageal cancer Neg Hx    Rectal cancer Neg Hx    Stomach cancer Neg Hx     Social History   Socioeconomic  History   Marital status: Married    Spouse name: Not on file   Number of children: Not on file   Years of education: Not on file   Highest education level: Not on file  Occupational History   Occupation: retired Pensions consultant  Tobacco Use   Smoking status: Never   Smokeless tobacco: Never  Substance and Sexual Activity   Alcohol use: Yes    Comment: occasionally    Drug use: No   Sexual activity: Not on file  Other Topics Concern   Not on file  Social History Narrative   Not on file   Social Drivers of Health   Financial Resource Strain: Not on file  Food Insecurity: No Food Insecurity (01/25/2022)   Received from [facility] visits prior to 03/24/2022., [facility]   Hunger Vital Sign    Worried About Running Out of Food in the Last Year: Never true    Ran Out of Food in the Last Year: Never true  Transportation Needs: No Transportation Needs (01/25/2022)   Received from [employer], [facility] visits prior to 03/24/2022.   PRAPARE - Administrator, Civil Service (Medical): No    Lack of Transportation (Non-Medical): No  Physical Activity: Not on file  Stress: Not on file  Social Connections: Not on file  Intimate Partner Violence: Not on file    Past Medical History, Surgical history, Social history, and Family history were reviewed and updated as appropriate.   Please see review of systems for further details on the patient's review from today.   Objective:   Physical Exam:  There were no vitals taken for this visit.  Physical Exam Constitutional:      General: He is not in acute distress.    Appearance: He is obese.   Musculoskeletal:        General: No deformity.   Neurological:     Mental Status: He is alert and oriented to person, place, and time.     Cranial Nerves: No dysarthria.     Comments: Shortened gait.  No sig tremor.  Slow.  Psychiatric:  Attention and Perception: Attention and  perception normal. He does not perceive auditory or visual hallucinations.        Mood and Affect: Mood is anxious and depressed. Affect is blunt. Affect is not labile, angry or inappropriate.        Speech: Speech normal.        Behavior: Behavior is slowed. Behavior is cooperative.        Thought Content: Thought content normal. Thought content is not paranoid or delusional. Thought content does not include homicidal or suicidal ideation. Thought content does not include suicidal plan.        Cognition and Memory: Cognition and memory normal.        Judgment: Judgment normal.     Comments: Insight intact depression and fatigue ongoing and worse until starting Spravato and already starting to improve OCD is about the same and not the main problem    November 06, 2018: [place] Cog test in office within normal limits MMSE 28/30. Animal fluency 17 . (borderline) Taken as a whole, no indication to pursue neuropsychological testing.  Mini-Mental status exam 28/30 on 10/27/20.  No evidence of dementia.  Lab Review:   Vitamin D level acceptable at 54.5.   Normal B12 and folate and TSH in last couple of years..  Echocardiogram is stable re: AVR over the last 8 years and not likely the cause of lethargy.  .res Assessment: Plan:    Jaylene was seen today for follow-up, depression, anxiety and fatigue.  Diagnoses and all orders for this visit:  Recurrent major depression resistant to treatment [facility])  Mixed obsessional thoughts and acts  Attention deficit hyperactivity disorder (ADHD), predominantly inattentive type  Hypersomnia with sleep apnea  Low vitamin D level  Obstructive sleep apnea  Mild cognitive impairment  Restless legs syndrome   Mr. Starliper has a long history of depression and OCD.  Major depression with fatigue, cognitive and physical slowness, anhedonia is much worse.  Started Spravato  Was better energy with duloxetine  90 vs Lexapro  so increased to 120 mg  daily DT worsening depression.    But it was not better. So reduced it back to 90 mg daily. Dep is much worse, in fact, his worst depression ever. pursue Spravato  He has some compulsive checking and obsessions around the house maintenance.  When travels then tends to have less OCD bc triggered less.    Patient was administered Spravato 84 mg intranasally today.  The patient experienced the typical dissociation which gradually resolved over the 2-hour period of observation.  There were no complications.  Specifically the patient did not have nausea or vomiting or headache.  Blood pressures remained within normal ranges at the 40-minute and 2-hour follow-up intervals.  By the time the 2-hour observation period was met the patient was alert and oriented and able to exit without assistance.  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  See nursing note for further details. Emphasized need to relax with Spravato and not return calls, read, return chats, emails etc.  His OCD is persistent with checking lites, stove, etc. .  It is better at the moment DT depression being worse.  Disc CBT techniques and potential for more therapy to address. Option Dr. Caroleen Churn.  Continue recent retrial Concerta  36 mg AM He wants to use it for depression.  Discussed potential benefits, risks, and side effects of stimulants with patient to include increased heart rate, palpitations,  insomnia, increased anxiety, increased irritability, or decreased appetite.  Instructed patient to contact office if experiencing any significant tolerability issues.  Extensive discussion of sleep study and he has a copy.  Why is there virtually no N3 & REM sleep?  Is that affecting daytime alertness and fatigue?  Vs how much is related to mild OSA and PLMS?  Disc this in detail.  Wrote coreespondence with sleep doc about it.  Options for sleep & fatigue:   Difficult to assess  bc has been out of country and then sick for 3 weeks.   Focus on reduction in OSA Focus on improving deep stage sleep.  ? Rx low dose mirtazapine or alternatives Focus on leg movements (hx RLS/PLMS) continue Trial as had been suggested Lyrica  for FM type sx and may help RLS/PLMS.  Better dreaming on 100 mg HS and too sleepy on 150.  Decreased  to 100 mg HS for sleep and back pain and RLS No ropinirole  needed.   Disc SE.  Not having any.   Use LED Xanax  and try to avoid BZ daytime bc fatigue.  He doesn't use it much daythime.  Using 0..25-0.5 mg at night.  May need less with more Lyrica  at HS and try to minimize.  No hangover. We discussed the short-term risks associated with benzodiazepines including sedation and increased fall risk among others.  Discussed long-term side effect risk including dependence, potential withdrawal symptoms, and the potential eventual dose-related risk of dementia.  But recent studies from 2020 dispute this association between benzodiazepines and dementia risk. Newer studies in 2020 do not support an association with dementia.  Stimulant partially successfully used off label to augment antidepressants for depression and have resulted in improved productivity and attention.    Previous screening of memory was not suggestive of any neuro degenerative process. Mini-Mental status exam 28/30 on 10/27/20.   Recent cognition worse as dep worsened.  Also gait is short and slow.  Pending neuro appt.  MRI completed 06/28/23 awaiting report.  Plan: Meds:  continue duloxetine  to 120 mg daily.  Off Lexapro .  Lyrica  100 mg HS.  resumed MPH ER 36 mg AM   Spravato disc in detail.  He wants to continue.  He and wife notice he's brigher .  Administer twice weekly for at least a month.  Will administer at 84 mg .   Pending report on MRI  Follow-up   Nori Beat MD, DFAPA.  Please see After Visit Summary for patient specific instructions.  Future  Appointments  Date Time Provider Department Center  07/11/2023  9:00 AM Cottle, Kennedy Peabody., MD [facility] None  07/11/2023  9:00 AM CP-NURSE [facility] None  07/12/2023  1:30 PM Tat, Von Grumbling, DO [facility] None  08/13/2023  1:30 PM Cottle, Kennedy Peabody., MD [facility] None  09/17/2023  1:00 PM Cottle, Kennedy Peabody., MD [facility] None  10/15/2023  1:30 PM Cottle, Kennedy Peabody., MD [facility] None     No orders of the defined types were placed in this encounter.      -------------------------------

## 2023-07-10 NOTE — Progress Notes (Unsigned)
 Assessment/Plan:  1.  Parkinsonism  - Possibly due to exposure to antipsychotic medication.  He was not exposed to anything for very long, but wife reports that symptoms have really been developing over the last 5 to 6 weeks.  He was on Abilify  for a very short time in February and March and then was on Vraylar  from March until June.  The symptoms developed around that time.  He is off of the Vraylar  now (just went off of it), so we will just need to see what develops.  He only has lower body parkinsonism.  He has no rigidity or tremor.  - I did recommend physical therapy.  He was agreeable and referral was sent.  - His primary care physician ordered an MRI brain and it was completed on June 6 but has not been read yet.  I called radiology to ask for them to go ahead and read that. -pt has appt at [facility] as well with neurorehab specialist, Dr. Branson Calandra but that appt is not until 02/2024  - I plan to make a follow-up appointment with him in 6 months.  Hopefully, he is stable or doing better at that time.  He is doing worse, we are going to consider DaT scan or perhaps skin biopsy for alpha-synuclein.  2.  Tardive dyskinesia  - Patient does definitely have some tardive movements of his tongue.  His tongue just slightly exits the mouth, but again I suspect that this is from the antipsychotic medication.  Again, this is unusual for it to happen so quickly after exposure.  He is now off of the medications.  Nonetheless, while tardive parkinsonism is quite rare, tardive dyskinesia is not rare and this could be a permanent issue.  Subjective:   Eric [name] was seen today in the movement disorders clinic for neurologic consultation at the request of Jonn Nett, DO. Pt with wife and son who supplement hx.   The consultation is for the evaluation of abnormal involuntary movements.  Patient is currently under the care of of psychiatry (Dr. Toi Foster) for mood disorder.  He was on Abilify  for very  short time (started on Abilify  end of February taken off March.  He was started on Vraylar  at the end of March and was on that medication until 6/12 (pt states he may have d/c it before then though even though Dr. Toi Foster didn't tell him to d/c until then).  He is sent here for further evaluation of movements by primary care.  Pt reports I can't walk.  He reports sx's x 1.5 years but wife states its sped up in the last 5 weeks.  He also started involuntary movements with his tongue. That started around feb.     Specific Symptoms:  Tremor: No. Family hx of similar:  No. Voice: its weakened over the last 6 weeks Sleep: wears cpap faithfully  Vivid Dreams:  No.  Acting out dreams:  No. Wet Pillows: Yes.   Postural symptoms:  Yes.    Falls?  No. Bradykinesia symptoms: shuffling gait and slow movements Loss of smell:  No. Loss of taste:  No. Urinary Incontinence:  Yes.   X 6 months Difficulty Swallowing:  No. Handwriting, micrographia: no Trouble with ADL's:  No.  Trouble buttoning clothing: No. Depression:  Yes.   Memory changes:  No. Hallucinations:  No.  visual distortions: No. N/V:  No. Lightheaded:  No.  Syncope: No. Diplopia:  No. Dyskinesia:  No. Prior exposure to reglan/antipsychotics: Yes.  ALLERGIES:   Allergies  Allergen Reactions   E.E.S. [Erythromycin] Hives   Macrolides And Ketolides Other (See Comments)    EES    Rosuvastatin     Other reaction(s): cramps    CURRENT MEDICATIONS:  Current Meds  Medication Sig   ALPRAZolam  (XANAX ) 0.25 MG tablet TAKE 1 TABLET (0.25 MG TOTAL) BY MOUTH 2 (TWO) TIMES DAILY AS NEEDED FOR ANXIETY OR SLEEP.   aspirin 81 MG tablet Take 81 mg by mouth daily.   Cholecalciferol (VITAMIN D-3) 5000 units TABS Take 5,000 Units by mouth daily.    DULoxetine  (CYMBALTA ) 30 MG capsule Take 3 capsules (90 mg total) by mouth daily.   methylphenidate  36 MG PO CR tablet Take 1 tablet (36 mg total) by mouth daily.   naltrexone  (DEPADE) 50  MG tablet Take 0.5 tablets (25 mg total) by mouth daily.   pregabalin  (LYRICA ) 50 MG capsule Take 1 capsule (50 mg total) by mouth at bedtime.   simvastatin (ZOCOR) 20 MG tablet Take 20 mg by mouth at bedtime.   ZEPBOUND  10 MG/0.5ML Pen Inject 10 mg into the skin once a week.     Objective:   VITALS:   Vitals:   07/12/23 1337  BP: (!) 150/68  Pulse: 80  SpO2: 94%  Weight: 252 lb (114.3 kg)  Height: 5' 10 (1.778 m)    GEN:  The patient appears stated age and is in NAD. HEENT:  Normocephalic, atraumatic.  The tongue is thick and there is some abnormal protrusion from the mouth involuntarily.  The mucous membranes are moist. The superficial temporal arteries are without ropiness or tenderness. CV:  RRR Lungs:  CTAB Neck/HEME:  There are no carotid bruits bilaterally.  Neurological examination:  Orientation: The patient is alert and oriented x3.  Cranial nerves: There is good facial symmetry. Extraocular muscles are intact. The visual fields are full to confrontational testing. The speech is fluent and clear. Soft palate rises symmetrically and there is no tongue deviation. Hearing is intact to conversational tone. Sensation: Sensation is intact to light and pinprick throughout (facial, trunk, extremities). Vibration is intact at the bilateral big toe. There is no extinction with double simultaneous stimulation. There is no sensory dermatomal level identified. Motor: Strength is 5/5 in the bilateral upper and lower extremities.   Shoulder shrug is equal and symmetric.  There is no pronator drift. Deep tendon reflexes: Deep tendon reflexes are 2/4 at the bilateral biceps, triceps, brachioradialis, patella and achilles. Plantar responses are downgoing bilaterally.  Movement examination: Tone: There is normal tone in the bilateral upper extremities.  The tone in the lower extremities is normal.  Abnormal movements: No rest tremor.  No postural tremor.  No intention tremor.  As above,  there is some abnormal protrusion of the tongue from the mouth. Coordination:  There is no decremation with RAM's, with any form of RAMS, including alternating supination and pronation of the forearm, hand opening and closing, finger taps, heel taps and toe taps.  Gait and Station: The patient has  difficulty arising out of a deep-seated chair without the use of the hands. The patient's stride length is markedly decreased with decreased arm swing.     Total time spent on today's visit was 80 minutes, including both face-to-face time and nonface-to-face time.  Time included that spent on review of records (prior notes available to me/labs/imaging if pertinent), discussing treatment and goals, answering patient's questions and coordinating care.  Cc:  Lanae Pinal, MD

## 2023-07-11 ENCOUNTER — Encounter: Admitting: Psychiatry

## 2023-07-11 ENCOUNTER — Encounter: Admitting: Physician Assistant

## 2023-07-11 ENCOUNTER — Ambulatory Visit: Admitting: Behavioral Health

## 2023-07-11 ENCOUNTER — Ambulatory Visit

## 2023-07-11 VITALS — BP 133/72 | HR 67

## 2023-07-11 DIAGNOSIS — F339 Major depressive disorder, recurrent, unspecified: Secondary | ICD-10-CM | POA: Diagnosis not present

## 2023-07-11 NOTE — Progress Notes (Signed)
 Eric [name] [id number] 07/25/1950 73 y.o.  Subjective:   Patient ID:  Eric [name] is a 73 y.o. (DOB Nov 09, 1950) male.  Chief Complaint: No chief complaint on file.   HPI Eric [name]. Hua presents to the office today for follow-up of  treatment resistant depression (TRD).   Spravato treatment    Patient was administered Spravato 84 mg intranasally today. Patient was observed by provider throughout Spravato treatment. The patient experienced the typical dissociation which gradually resolved over the 2-hour period of observation. There were no complications. Specifically, the patient did not have any untoward side effects - feeling disconnected from themself, their thoughts, feelings and things around them, dizziness, nausea, feeling sleepy, decreased feeling of sensitivity (numbness) spinning sensation, feeling anxious, lack of energy, increased blood pressure, feeling happy or very excited, or headache. Blood pressures remained within normal ranges at the 40-minute and 2-hour follow-up intervals. By the time the 2-hour observation period was met the patient was alert and oriented and able to exit without assistance. Patient willing to continue Spravato administration for the treatment of resistant depression.    Reports Depression 6/10 today. Feels like depression is starting to improve.  No question or concerns   ECT-MADRS    Flowsheet Row Office Visit from 06/18/2023 in [facility] Office Visit from 04/16/2023 in [facility]  MADRS Total Score 37 36   PHQ2-9    Flowsheet Row Office Visit from 03/15/2020 in [facility] Total Score 3  PHQ-9 Total Score 9     Review of Systems:  Review of Systems  Constitutional: Negative.   Allergic/Immunologic: Negative.   Neurological: Negative.   Psychiatric/Behavioral:  Positive for dysphoric mood.     Medications:  I have reviewed the patient's current medications.  Current Outpatient Medications  Medication Sig Dispense Refill   ALPRAZolam  (XANAX ) 0.25 MG tablet TAKE 1 TABLET (0.25 MG TOTAL) BY MOUTH 2 (TWO) TIMES DAILY AS NEEDED FOR ANXIETY OR SLEEP. 30 tablet 1   aspirin 81 MG tablet Take 81 mg by mouth daily.     Cholecalciferol (VITAMIN D-3) 5000 units TABS Take 5,000 Units by mouth daily.      DULoxetine  (CYMBALTA ) 30 MG capsule Take 3 capsules (90 mg total) by mouth daily. 270 capsule 0   methylphenidate  36 MG PO CR tablet Take 1 tablet (36 mg total) by mouth daily. 30 tablet 0   naltrexone  (DEPADE) 50 MG tablet Take 0.5 tablets (25 mg total) by mouth daily. 45 tablet 1   pregabalin  (LYRICA ) 50 MG capsule Take 1 capsule (50 mg total) by mouth at bedtime. 90 capsule 0   Semaglutide ,0.25 or 0.5MG /DOS, (OZEMPIC , 0.25 OR 0.5 MG/DOSE,) 2 MG/1.5ML SOPN Inject 2.4 mg into the skin once a week.     simvastatin (ZOCOR) 20 MG tablet Take 20 mg by mouth at bedtime.     No current facility-administered medications for this visit.    Medication Side Effects: None  Allergies:  Allergies  Allergen Reactions   E.E.S. [Erythromycin] Hives   Macrolides And Ketolides Other (See Comments)    EES    Rosuvastatin     Other reaction(s): cramps    Past Medical History:  Diagnosis Date   Allergy    Anemia    iron- pt denies    Anxiety    Aortic cusp regurgitation    Carotid artery occlusion    Constipation    Coronary artery stenosis  Hyperlipidemia    Lactose intolerance    Major depression, recurrent, chronic (HCC)    Obesity    OCD (obsessive compulsive disorder)    OSA (obstructive sleep apnea)    Other chronic pain    Periodic limb movement disorder    Periodic limb movements of sleep    Prediabetes    Pure hypercholesterolemia    Restless legs    Sleep apnea    wears cpap    Vitamin D deficiency     Past Medical History, Surgical history, Social history, and Family history were  reviewed and updated as appropriate.   Please see review of systems for further details on the patient's review from today.   Objective:   Physical Exam:  There were no vitals taken for this visit.  Physical Exam Constitutional:      General: He is not in acute distress.    Appearance: Normal appearance.   Neurological:     Mental Status: He is alert and oriented to person, place, and time.     Gait: Gait normal.   Psychiatric:        Attention and Perception: Attention and perception normal. He does not perceive auditory or visual hallucinations.        Mood and Affect: Mood and affect normal. Mood is not anxious or depressed. Affect is not labile.        Speech: Speech normal.        Behavior: Behavior normal. Behavior is cooperative.        Thought Content: Thought content normal.        Cognition and Memory: Cognition and memory normal.        Judgment: Judgment normal.     Lab Review:  No results found for: NA, K, CL, CO2, GLUCOSE, BUN, CREATININE, CALCIUM, PROT, ALBUMIN, AST, ALT, ALKPHOS, BILITOT, GFRNONAA, GFRAA  No results found for: WBC, RBC, HGB, HCT, PLT, MCV, MCH, MCHC, RDW, LYMPHSABS, MONOABS, EOSABS, BASOSABS  No results found for: POCLITH, LITHIUM    No results found for: PHENYTOIN, PHENOBARB, VALPROATE, CBMZ   .res Assessment: Plan:   Recommendations:  Continue Spravato treatment. Pt did not have any questions or concerns today.  Will continue to f/u with Nori Beat MD for medication management.    Polly Brink A. Samin Milke, NP   There are no diagnoses linked to this encounter.   Please see After Visit Summary for patient specific instructions.  Future Appointments  Date Time Provider Department Center  07/12/2023  1:30 PM Tat, Von Grumbling, DO [facility] None  08/13/2023  1:30 PM Cottle, Kennedy Peabody., MD [facility] None  09/17/2023  1:00 PM Cottle, Kennedy Peabody., MD [facility] None  10/15/2023  1:30 PM  Cottle, Kennedy Peabody., MD [facility] None    No orders of the defined types were placed in this encounter.   -------------------------------

## 2023-07-11 NOTE — Progress Notes (Signed)
 NURSES NOTE:         Pt arrived for his 5 th Spravato Treatment for treatment resistant depression, the starting dose was 56 mg (2 of the 28 mg nasal sprays) and he tolerated it well with no side effects or complaints. Pt reports no issues after his last treatment so he will continue 84 mg which is maintenance dose and he will continue as long as tolerating it well. Eric [name] is a patient of Dr. Guillermo Lees so he will follow his care throughout treatments and follow ups. Pt's Spravato is a medical authorization through buy and bill.  Spravato medication is stored at treatment center per REMS/FDA guidelines. The medication is required to be locked behind two doors per REMS/FDA protocol. Medication is also disposed of properly after each use per regulations. All documentation for REMS is completed and submitted per FDA/REMS requirements.          Pt did arrive late today and I explained normally we can not take anyone past 15 minutes after due to room availability but today it worked out. They were also notified Dr. Toi Foster is not in the office today so he will see a different provider. Began taking patient's vital signs at 9:30 AM 136/70, pulse 66, SpO2 96%. Instructed patient to blow his nose if needed then recline back to a 45 degree angle. Gave patient first dose 28 mg nasal spray, administered in each nostril as directed and observed by nurse, waited 5 more minutes for the second and third doses. After all doses given pt did not complain of any nausea/vomiting. Assessed his 40 minute vitals, 10:10 AM, 135/72, pulse 64, SpO2 91%. Pt reports doing fine, no complaints voiced  Explained he would be monitored for a total time of 120 minutes. Discharge vitals were taken at 11:15 AM 133/72, P 67, SpO2 92%. Marita Sidle, NP came to visit with patient once his thoughts were clearer to discuss how treatment went. Recommend he go home and sleep or just relax on the couch. No driving, no intense activities. Verbalized  understanding. Pt. will be receiving 2 treatments per week for 4 weeks as recommended. Nurse was with pt a total of 60 minutes for clinical assessment. Pt is always taken down via wheelchair due to pt's unsteady gait prior to treatments. Pt is scheduled next week, pt was advised he will have to come Monday and Thursday. Instructed to call with any issues.     LOT [id number] EXP OCT 2027

## 2023-07-12 ENCOUNTER — Ambulatory Visit: Admitting: Neurology

## 2023-07-12 ENCOUNTER — Encounter: Payer: Self-pay | Admitting: Neurology

## 2023-07-12 VITALS — BP 150/68 | HR 80 | Ht 70.0 in | Wt 252.0 lb

## 2023-07-12 DIAGNOSIS — G2401 Drug induced subacute dyskinesia: Secondary | ICD-10-CM | POA: Diagnosis not present

## 2023-07-12 DIAGNOSIS — G20C Parkinsonism, unspecified: Secondary | ICD-10-CM

## 2023-07-12 NOTE — Patient Instructions (Signed)
 We discussed that your psychiatric medications could be contributing but you are off of those now.  We need to wait 6 months to see how you do.  If you are the same or better, we won't do anything more.  If you are worse, we will see you potentially before 6 months.  We sent a referral to physical therapy and you need to work hard with them.  The physicians and staff at [facility] are committed to providing excellent care. You may receive a survey requesting feedback about your experience at our office. We strive to receive very good responses to the survey questions. If you feel that your experience would prevent you from giving the office a very good  response, please contact our office to try to remedy the situation. We may be reached at [phone]. Thank you for taking the time out of your busy day to complete the survey.

## 2023-07-14 ENCOUNTER — Encounter: Payer: Self-pay | Admitting: Neurology

## 2023-07-15 ENCOUNTER — Ambulatory Visit

## 2023-07-15 ENCOUNTER — Ambulatory Visit (INDEPENDENT_AMBULATORY_CARE_PROVIDER_SITE_OTHER): Admitting: Psychiatry

## 2023-07-15 VITALS — BP 143/54 | HR 69

## 2023-07-15 DIAGNOSIS — R7989 Other specified abnormal findings of blood chemistry: Secondary | ICD-10-CM

## 2023-07-15 DIAGNOSIS — F339 Major depressive disorder, recurrent, unspecified: Secondary | ICD-10-CM

## 2023-07-15 DIAGNOSIS — G4733 Obstructive sleep apnea (adult) (pediatric): Secondary | ICD-10-CM

## 2023-07-15 DIAGNOSIS — F422 Mixed obsessional thoughts and acts: Secondary | ICD-10-CM

## 2023-07-15 DIAGNOSIS — F900 Attention-deficit hyperactivity disorder, predominantly inattentive type: Secondary | ICD-10-CM

## 2023-07-15 DIAGNOSIS — G4710 Hypersomnia, unspecified: Secondary | ICD-10-CM

## 2023-07-15 DIAGNOSIS — G2581 Restless legs syndrome: Secondary | ICD-10-CM

## 2023-07-15 DIAGNOSIS — G3184 Mild cognitive impairment, so stated: Secondary | ICD-10-CM

## 2023-07-15 DIAGNOSIS — F5221 Male erectile disorder: Secondary | ICD-10-CM

## 2023-07-15 DIAGNOSIS — K080 Exfoliation of teeth due to systemic causes: Secondary | ICD-10-CM | POA: Diagnosis not present

## 2023-07-15 NOTE — Progress Notes (Signed)
 NURSES NOTE:         Pt arrived for his 6 th Spravato Treatment for treatment resistant depression, the starting dose was 56 mg (2 of the 28 mg nasal sprays) and he tolerated it well with no side effects or complaints. Pt reports no issues after his last treatment so he will continue 84 mg which is maintenance dose and he will continue as long as tolerating it well. Eric [name] is a patient of Dr. Calhoun so he will follow his care throughout treatments and follow ups. Pt's Spravato is a medical authorization through buy and bill.  Spravato medication is stored at treatment center per REMS/FDA guidelines. The medication is required to be locked behind two doors per REMS/FDA protocol. Medication is also disposed of properly after each use per regulations. All documentation for REMS is completed and submitted per FDA/REMS requirements.          Began taking patient's vital signs at 9:05 AM 138/75, pulse 64, SpO2 95%. Instructed patient to blow his nose if needed then recline back to a 45 degree angle. Gave patient first dose 28 mg nasal spray, administered in each nostril as directed and observed by nurse, waited 5 more minutes for the second and third doses. After all doses given pt did not complain of any nausea/vomiting. Assessed his 40 minute vitals, 9:50 AM, 142/78, pulse 66, SpO2 91%. Pt reports doing fine, no complaints voiced  Explained he would be monitored for a total time of 120 minutes. Discharge vitals were taken at 10:55 AM 143/54, P 69, SpO2 91%. Dr. Geoffry came to visit with patient once his thoughts were clearer to discuss how treatment went. Recommend he go home and sleep or just relax on the couch. No driving, no intense activities. Verbalized understanding. Pt. will be receiving 2 treatments per week for 4 weeks as recommended. Nurse was with pt a total of 60 minutes for clinical assessment. Pt is always taken down via wheelchair due to pt's unsteady gait prior to treatments. Pt is scheduled  Thursday. Instructed to call with any issues.     LOT [id number] EXP OCT 2027

## 2023-07-15 NOTE — Progress Notes (Unsigned)
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 decide about how to deal with a trigger lately.  OCD driving me crazy wanting to make things perfect.   Other than the trigger had a nice time since here.  GS here for 5 days at 73 yo.  Enjoyed that.   Taking semaglutide   down to 250# from 283#. Card at [facility] says he does not have Aortic aneurysm.  Measuring is OK.  Will FU with MRI in June.   Some diarrhea lately. Cannot stop worrying.  Triggered by the plumbing issue. Meds as above.  No SE of sig.   Plan:  switched to duloxetine  to 90 mg daily.  Off Lexapro .  Others as noted. Lyrica  100 mg HS. Has held Ritalin  10 TID, can resume as long as SBP below 140 per card.  none needed ropinirole  3 mg pm.  No risperidone .  02/18/23 appt noted:  with [name] Meds: as above. Taking Ritalin  30% of night.  Prn Xanax  rarely if gets off sleep cycle. No SE.   Terribly dep but not as bad as in the past. Taking Ritalin  appears to help significantly and started doing that.  Tend to stay in bed till noon but pattern of up late.   OCD about the same with some avoidance of detail work.  Afraid I'll throw away something I need.   She doesn't know whether Ritalin  helps No sig RLS and  wife agrees. Thinks of deceased B at the holidays but worries over son Eric too.   Plan: Increase duloxetine  to 120 mg daily.  Off Lexapro .  Others as noted. Lyrica  100 mg HS.  resumed Ritalin  30 AM with some benefit. no risperidone .  03/19/23 appt noted: Psych med:  duloxetine  120, Ritalin  20 am  missing  doses, Lyrica  100 HS, no risperidone , no ropinirole .   No improvement in depression since increase dose duloxetine .  More dep than usual.  Worrying about everything.  Including taxes.  All I can see is a black hole.  Making him feel negative about everything.   Keeping him from doing things.   OCD is not much different from when on Lexapro .  Wife agrees dep and distracted. Plan: resumed Ritalin  30 AM with some benefit. Resume Abilify  2 mg AM for dep.  04/16/23 appt noted:  wife here Psych med:  duloxetine  120, resumed Concerta  36 mg AM, Lyrica  100 HS, Abilify  2, no ropinirole .   Wife noted he seemed manic yesterday.  Invited window estimate against wife's will and without her input.   No mania noted until yesterday. He didn't feel manic yesterday.   He's noticed no change with Abilify  and still feels flat and a little low.  From MPH energy and mood a little better.  Sleeps until 10-11 am about 12 hours. And asleep that whole time. Wife says he doesn't eat much bc he's not awake much. Trouble with hygiene.   Plan: Meds: continue duloxetine  to 120 mg daily.  Off Lexapro .  Lyrica  100 mg HS.  resumed Ritalin  30 AM with some benefit. DC Abiilify Vraylar  1.5 mg every other day Spravato disc in detail.  He wants to pursue if not better with Vraylar .  06/18/23 appt noted: with W Med:  duloxetine  120, resumed Concerta  36 mg AM, Lyrica  100 HS, no ropinirole .   Vraylar  1.5 every other day, no benefit Spravato denied by insurance but is being appealed. More trouble walking and shorter gait.  Neuro in [place] appt in Sept.   Looking to get in in [place].  Can't be much more dep than I am.   OCD  residual when has to make a decision.   ED is more of a px. Reduced voice family. Plan : Spravato  06/25/23 appt noted: with W Med:  duloxetine  120, Concerta  36 mg AM, Lyrica  100 HS, no ropinirole .   Vraylar  1.5 daily No SE Received Spravato 56 mg today.  Experienced very mild dissociation and no sig HA, N.  But some dizziness.  Resolved by end of 2 hours observation.  Able to leave office without assistance.  Ongoing dep without change.  Slow, reduced cognition, anhedonia, forgetful, low motivation. No other concerns with meds.   06/27/23 appt noted:  Med: Med:  duloxetine  120, Concerta  36 mg AM, Lyrica  100 HS, no ropinirole .   Stopped Vraylar  1.5 daily No SE Received Spravato 84 mg todayfor the first time..  Experienced very mild dissociation and no sig HA, N.  But some dizziness.  Resolved by end of 2 hours observation.  Able to leave office without assistance.  Ongoing dep without change.  Slow, reduced cognition, anhedonia, forgetful, low motivation. No other concerns with meds.   07/02/23 appt oted: Med: Med:  duloxetine  90, Concerta  36 mg AM, Lyrica  100 HS, no ropinirole .   Stopped Vraylar  1.5 daily No SE Received Spravato 84 mg today.  Experienced very mild dissociation and no sig HA, N.  But some dizziness.  Resolved by end of 2 hours observation.  Mildly drowsy at end of session. BC of gait px he needed to use WC to leave the office.  Per wife gait gradually worsened over a couple of years but accelerated recently in 3 mos or so.  Short gait.  Not due to pain.  Pending neuro appt.  MRI  last week not read yet. No problems with meds. Wife noticed he's shown more initiative at home since [place] ex doing crossword puzzles.  More engaged.  He feels lighter already with it.  07/15/23 appt noted:  Med: Med:  duloxetine  90, Concerta  36 mg AM, Lyrica  100 HS, no ropinirole .  No SE Received Spravato 84 mg today.  Experienced very mild dissociation and no sig HA, N.  But some dizziness.   Resolved by end of 2 hours observation.  Mildly drowsy at end of session. BC of gait px he needed to use WC to leave the office.    ECT-MADRS    Flowsheet Row Office Visit from 06/18/2023 in [facility] Office Visit from 04/16/2023 in [facility]  MADRS Total Score 37 36   PHQ2-9    Flowsheet Row Office Visit from 03/15/2020 in [facility] Total Score 3  PHQ-9 Total Score 9     B schizophrenic SUI. After M's death. PCP Cecil Ee at [place]  [employer] at 73 years old.  Prior psychiatric medication trials include  Lexapro  20, citalopram NR, clomipramine weight gain, paroxetine, fluoxetine, Luvox, Trintellix,   Increase Lexapro  back to 20 mg January 2020. & 10/2020 bupropion ,  Auvelity NR  Abilify  10 fatigue,  Cerefolin NAC, and   Naltrexone  sexual SE Lyrica  150 tired  pramipexole,  ropinirole  Adderall XR & IR sexual SE,  Ritalin  30, Concerta  72 mg AM NR modafinil  and Nuvigil,   History [employer] OCD  Review of Systems:  Review of Systems  Constitutional:  Positive for fatigue. Negative for fever.  Cardiovascular:  Negative for chest pain and palpitations.  Genitourinary:        ED  Musculoskeletal:  Positive for arthralgias, gait problem and myalgias.  Neurological:  Negative for tremors and weakness.  Psychiatric/Behavioral:  Positive for dysphoric mood and sleep disturbance. Negative for agitation, behavioral problems, confusion, decreased concentration, hallucinations, self-injury and suicidal ideas. The patient is nervous/anxious. The patient is not hyperactive.     Medications: I have reviewed the patient's current medications.  Current Outpatient Medications  Medication Sig Dispense Refill  . ALPRAZolam  (XANAX ) 0.25 MG tablet TAKE 1 TABLET (0.25 MG TOTAL) BY MOUTH 2 (TWO) TIMES DAILY AS NEEDED FOR ANXIETY OR SLEEP. 30 tablet 1  .  aspirin 81 MG tablet Take 81 mg by mouth daily.    . Cholecalciferol (VITAMIN D-3) 5000 units TABS Take 5,000 Units by mouth daily.     . DULoxetine  (CYMBALTA ) 30 MG capsule Take 3 capsules (90 mg total) by mouth daily. 270 capsule 0  . methylphenidate  36 MG PO CR tablet Take 1 tablet (36 mg total) by mouth daily. 30 tablet 0  . naltrexone  (DEPADE) 50 MG tablet Take 0.5 tablets (25 mg total) by mouth daily. 45 tablet 1  . pregabalin  (LYRICA ) 50 MG capsule Take 1 capsule (50 mg total) by mouth at bedtime. 90 capsule 0  . Semaglutide ,0.25 or 0.5MG /DOS, (OZEMPIC , 0.25 OR 0.5 MG/DOSE,) 2 MG/1.5ML SOPN Inject 2.4 mg into the skin once a week.    . simvastatin (ZOCOR) 20 MG tablet Take 20 mg by mouth at bedtime.    . ZEPBOUND  10 MG/0.5ML Pen Inject 10 mg into the skin once a week.     No current facility-administered medications for this visit.    Medication Side Effects: None sexual SE are better not  All gone.  Allergies:  Allergies  Allergen  Reactions  . E.E.S. [Erythromycin] Hives  . Macrolides And Ketolides Other (See Comments)    EES   . Rosuvastatin     Other reaction(s): cramps    Past Medical History:  Diagnosis Date  . Allergy   . Anemia    iron- pt denies   . Anxiety   . Aortic cusp regurgitation   . Carotid artery occlusion   . Constipation   . Coronary artery stenosis   . Hyperlipidemia   . Lactose intolerance   . Major depression, recurrent, chronic (HCC)   . Obesity   . OCD (obsessive compulsive disorder)   . OSA (obstructive sleep apnea)   . Other chronic pain   . Periodic limb movement disorder   . Periodic limb movements of sleep   . Prediabetes   . Pure hypercholesterolemia   . Restless legs   . Sleep apnea    wears cpap   . Vitamin D deficiency     Family History  Problem Relation Age of Onset  . Cancer Mother        breast and ovarian  . Anxiety disorder Mother   . Breast cancer Mother   . Ovarian cancer Mother   . Depression Father         bi-polar  . Hyperlipidemia Father   . Heart disease Father   . Sudden death Father   . Bipolar disorder Father   . Sleep apnea Father   . Obesity Father   . Depression Son   . Colon cancer Neg Hx   . Colon polyps Neg Hx   . Esophageal cancer Neg Hx   . Rectal cancer Neg Hx   . Stomach cancer Neg Hx     Social History   Socioeconomic History  . Marital status: Married    Spouse name: Not on file  . Number of children: Not on file  . Years of education: Not on file  . Highest education level: Not on file  Occupational History  . Occupation: retired Pensions consultant  Tobacco Use  . Smoking status: Never  . Smokeless tobacco: Never  Substance and Sexual Activity  . Alcohol use: Yes    Comment: occasionally   . Drug use: No  . Sexual activity: Not on file  Other Topics Concern  . Not on file  Social History Narrative  . Not on file   Social Drivers of Health   Financial Resource Strain: Not on file  Food Insecurity: No Food Insecurity (01/25/2022)   Received from [facility] visits prior to 03/24/2022[facility]   Hunger Vital Sign   . Worried About Programme researcher, broadcasting/film/video in the Last Year: Never true   . Ran Out of Food in the Last Year: Never true  Transportation Needs: No Transportation Needs (01/25/2022)   Received from [facility], [facility] visits prior to 03/24/2022.   PRAPARE - Transportation   . Lack of Transportation (Medical): No   . Lack of Transportation (Non-Medical): No  Physical Activity: Not on file  Stress: Not on file  Social Connections: Not on file  Intimate Partner Violence: Not on file    Past Medical History, Surgical history, Social history, and Family history were reviewed and updated as appropriate.   Please see review of systems for further details on the patient's review from today.   Objective:   Physical Exam:  There were no vitals taken for this visit.  Physical Exam Constitutional:  General: He is not in acute distress.    Appearance: He is obese.   Musculoskeletal:        General: No deformity.   Neurological:     Mental Status: He is alert and oriented to person, place, and time.     Cranial Nerves: No dysarthria.     Comments: Shortened gait.  No sig tremor.  Slow.  Psychiatric:        Attention and Perception: Attention and perception normal. He does not perceive auditory or visual hallucinations.        Mood and Affect: Mood is anxious and depressed. Affect is blunt. Affect is not labile, angry or inappropriate.        Speech: Speech normal.        Behavior: Behavior is slowed. Behavior is cooperative.        Thought Content: Thought content normal. Thought content is not paranoid or delusional. Thought content does not include homicidal or suicidal ideation. Thought content does not include suicidal plan.        Cognition and Memory: Cognition and memory normal.        Judgment: Judgment normal.     Comments: Insight intact depression and fatigue ongoing and worse until starting Spravato and already starting to improve OCD is about the same and not the main problem   November 06, 2018: [place] Cog test in office within normal limits MMSE 28/30. Animal fluency 17 . (borderline) Taken as a whole, no indication to pursue neuropsychological testing.  Mini-Mental status exam 28/30 on 10/27/20.  No evidence of dementia.  Lab Review:   Vitamin D level acceptable at 54.5.   Normal B12 and folate and TSH in last couple of years..  Echocardiogram is stable re: AVR over the last 8 years and not likely the cause of lethargy.   06/28/23 MRI head:  IMPRESSION: 1. No acute intracranial abnormality. 2. 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 Head CT may be helpful for further assessment of the degree of mineralization. 3. Findings of chronic small  vessel ischemia.  SABRAres Assessment: Plan:    There are no diagnoses linked to this encounter.  Mr. Chabot has a long history of depression and OCD.  Major depression with fatigue, cognitive and physical slowness, anhedonia is much worse.  Started Spravato  Was better energy with duloxetine  90 vs Lexapro  so increased to 120 mg daily DT worsening depression.    But it was not better. So reduced it back to 90 mg daily. Dep is much worse, in fact, his worst depression ever. pursue Spravato  He has some compulsive checking and obsessions around the house maintenance.  When travels then tends to have less OCD bc triggered less.    Patient was administered Spravato 84 mg intranasally today.  The patient experienced the typical dissociation which gradually resolved over the 2-hour period of observation.  There were no complications.  Specifically the patient did not have nausea or vomiting or headache.  Blood pressures remained within normal ranges at the 40-minute and 2-hour follow-up intervals.  By the time the 2-hour observation period was met the patient was alert and oriented and able to exit without assistance.  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  See nursing note for further details. Emphasized need to relax with Spravato and not return calls, read, return chats, emails  etc.  His OCD is persistent with checking lites, stove, etc. .  It is better at the moment DT depression being worse.  Disc CBT techniques and potential for more therapy to address. Option Dr. Marijean.  Continue recent retrial Concerta  36 mg AM He wants to use it for depression.  Discussed potential benefits, risks, and side effects of stimulants with patient to include increased heart rate, palpitations, insomnia, increased anxiety, increased irritability, or decreased appetite.  Instructed patient to contact office if  experiencing any significant tolerability issues.  Extensive discussion of sleep study and he has a copy.  Why is there virtually no N3 & REM sleep?  Is that affecting daytime alertness and fatigue?  Vs how much is related to mild OSA and PLMS?  Disc this in detail.  Wrote coreespondence with sleep doc about it.  Options for sleep & fatigue:  Difficult to assess  bc has been out of country and then sick for 3 weeks.   Focus on reduction in OSA Focus on improving deep stage sleep.  ? Rx low dose mirtazapine or alternatives Focus on leg movements (hx RLS/PLMS) continue Trial as had been suggested Lyrica  for FM type sx and may help RLS/PLMS.  Better dreaming on 100 mg HS and too sleepy on 150.  Decreased  to 100 mg HS for sleep and back pain and RLS No ropinirole  needed.   Disc SE.  Not having any.   Use LED Xanax  and try to avoid BZ daytime bc fatigue.  He doesn't use it much daythime.  Using 0..25-0.5 mg at night.  May need less with more Lyrica  at HS and try to minimize.  No hangover. We discussed the short-term risks associated with benzodiazepines including sedation and increased fall risk among others.  Discussed long-term side effect risk including dependence, potential withdrawal symptoms, and the potential eventual dose-related risk of dementia.  But recent studies from 2020 dispute this association between benzodiazepines and dementia risk. Newer studies in 2020 do not support an association with dementia.  Stimulant partially successfully used off label to augment antidepressants for depression and have resulted in improved productivity and attention.    Previous screening of memory was not suggestive of any neuro degenerative process. Mini-Mental status exam 28/30 on 10/27/20.   Recent cognition worse as dep worsened.  Also gait is short and slow.  Pending neuro appt.  MRI completed 06/28/23 awaiting report.  Plan: Meds:  continue duloxetine  to 120 mg daily.  Off Lexapro .  Lyrica  100  mg HS.  resumed MPH ER 36 mg AM   Spravato disc in detail.  He wants to continue.  He and wife notice he's brigher .  Administer twice weekly for at least a month.  Will administer at 84 mg .   Pending report on MRI  Follow-up   Lorene Macintosh MD, DFAPA.  Please see After Visit Summary for patient specific instructions.  Future Appointments  Date Time Provider Department Center  07/16/2023  8:45 AM Tat, Asberry RAMAN, DO [facility] None  08/13/2023  1:30 PM Cottle, Lorene KANDICE Raddle., MD [facility] None  09/17/2023  1:00 PM Cottle, Lorene KANDICE Raddle., MD [facility] None  10/15/2023  1:30 PM Cottle, Lorene KANDICE Raddle., MD [facility] None  01/30/2024  3:00 PM Tat, Asberry RAMAN, DO [facility] None     No orders of the defined types were placed in this encounter.      -------------------------------

## 2023-07-16 ENCOUNTER — Encounter: Payer: Self-pay | Admitting: Neurology

## 2023-07-16 ENCOUNTER — Other Ambulatory Visit: Payer: Self-pay

## 2023-07-16 ENCOUNTER — Telehealth: Admitting: Neurology

## 2023-07-16 ENCOUNTER — Ambulatory Visit: Payer: Medicare Other | Admitting: Psychiatry

## 2023-07-16 ENCOUNTER — Encounter: Payer: Self-pay | Admitting: Psychiatry

## 2023-07-16 DIAGNOSIS — R9402 Abnormal brain scan: Secondary | ICD-10-CM

## 2023-07-16 DIAGNOSIS — G2111 Neuroleptic induced parkinsonism: Secondary | ICD-10-CM

## 2023-07-16 DIAGNOSIS — R9389 Abnormal findings on diagnostic imaging of other specified body structures: Secondary | ICD-10-CM

## 2023-07-16 DIAGNOSIS — T43505A Adverse effect of unspecified antipsychotics and neuroleptics, initial encounter: Secondary | ICD-10-CM

## 2023-07-16 NOTE — Progress Notes (Signed)
 Virtual Visit Via Video       Consent was obtained for video visit:  Yes.   Answered questions that patient had about telehealth interaction:  Yes.   I discussed the limitations, risks, security and privacy concerns of performing an evaluation and management service by telemedicine. I also discussed with the patient that there may be a patient responsible charge related to this service. The patient expressed understanding and agreed to proceed.  Pt location: Home Physician Location: office Name of referring provider:  Regino Slater, MD I connected with Eric [name] at patients initiation/request on 07/16/2023 at  8:45 AM EDT by video enabled telemedicine application and verified that I am speaking with the correct person using two identifiers. Pt MRN:  [id number] Pt DOB:  1950-01-24 Video Participants:  Eric [name];  wife supplements hx  Assessment/Plan:   1.  Parkinsonism             - Possibly due to exposure to antipsychotic medication.  He was not exposed to anything for very long, but wife reports that symptoms have really been developing over the last 5 to 6 weeks.  He was on Abilify  for a very short time in February and March and then was on Vraylar  from March until June.  The symptoms developed around that time.  He is off of the Vraylar  now (just went off of it), so we will just need to see what develops.  He only has lower body parkinsonism.  He has no rigidity or tremor.             - I did recommend physical therapy.  He was agreeable and referral has been already sent.   2.  Tardive dyskinesia             - Patient does definitely have some tardive movements of his tongue.  His tongue just slightly exits the mouth, but again I suspect that this is from the antipsychotic medication.  Again, this is unusual for it to happen so quickly after exposure.  He is now off of the medications.  Nonetheless, while tardive parkinsonism is quite rare, tardive dyskinesia is  not rare and this could be a permanent issue.  3.  Abnormal brain scan  - I was not the ordering physician, but I have reviewed the brain scan personally.  It does appear that there is heavy basal ganglia calcification.  We can certainly confirm this with CT.  While Fahrs disease is on the differential, the timeline for his symptoms really correlates more with the timeline for when he was placed on antipsychotic medication.  In addition, he is a little old for the diagnosis of Fahr's syndrome.  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  Nonetheless, I would still asked that he follow-up with his primary care doctor to make sure that there is no underlying hypocalcemia/hypoparathyroidism.  Certainly, basal ganglia calcifications can be idiopathic as well as well as exposure to toxins (carbon monoxide, mercury, lead, etc.).  he is going to follow up with pcp for consideration of this testing but they deny exposure.  - Patient does have some mild small vessel disease on MRI.  We discussed nature and etiology of small vessel disease in this age group with his medical problems. Subjective:   Eric [name] was seen today in follow up.  I just saw the patient at the end of last week.  They present  today to discuss his MRI, ordered by his primary care physician.  They saw Dr. Geoffry yesterday.  Notes are not completed yet for my review.  Patient states that medications were not changed.  They did discuss the Vraylar  yesterday.   ALLERGIES:   Allergies  Allergen Reactions   E.E.S. [Erythromycin] Hives   Macrolides And Ketolides Other (See Comments)    EES    Rosuvastatin     Other reaction(s): cramps    CURRENT MEDICATIONS:  No outpatient medications have been marked as taking for the 07/16/23 encounter (Appointment) with Hazle Ogburn, Asberry RAMAN, DO.     Objective:   PHYSICAL EXAMINATION:    VITALS:  There were no vitals filed for this  visit. Virtually 100% of the visit is spent in counseling.  GEN:  The patient appears stated age and is in NAD.  Neurological examination:  Orientation: The patient is alert and oriented x3. His speech was fluent and clear.  I have reviewed and interpreted the following labs independently    Chemistry   No results found for: NA, K, CL, CO2, BUN, CREATININE, GLU No results found for: CALCIUM, ALKPHOS, AST, ALT, BILITOT     No results found for: WBC, HGB, HCT, MCV, PLT  Lab Results  Component Value Date   TSH 1.560 12/22/2020    Follow up Instructions      -I discussed the assessment and treatment plan with the patient. The patient was provided an opportunity to ask questions and all were answered. The patient agreed with the plan and demonstrated an understanding of the instructions.   The patient was advised to call back or seek an in-person evaluation if the symptoms worsen or if the condition fails to improve as anticipated.     Asberry Schneider, DO   Cc:  Regino Slater, MD

## 2023-07-18 ENCOUNTER — Encounter: Payer: Self-pay | Admitting: Psychiatry

## 2023-07-18 ENCOUNTER — Ambulatory Visit

## 2023-07-18 ENCOUNTER — Ambulatory Visit: Admitting: Psychiatry

## 2023-07-18 VITALS — BP 142/74 | HR 64

## 2023-07-18 DIAGNOSIS — F339 Major depressive disorder, recurrent, unspecified: Secondary | ICD-10-CM

## 2023-07-18 DIAGNOSIS — F5221 Male erectile disorder: Secondary | ICD-10-CM

## 2023-07-18 DIAGNOSIS — F900 Attention-deficit hyperactivity disorder, predominantly inattentive type: Secondary | ICD-10-CM

## 2023-07-18 DIAGNOSIS — G4710 Hypersomnia, unspecified: Secondary | ICD-10-CM

## 2023-07-18 DIAGNOSIS — G3184 Mild cognitive impairment, so stated: Secondary | ICD-10-CM

## 2023-07-18 DIAGNOSIS — F422 Mixed obsessional thoughts and acts: Secondary | ICD-10-CM

## 2023-07-18 DIAGNOSIS — G4733 Obstructive sleep apnea (adult) (pediatric): Secondary | ICD-10-CM

## 2023-07-18 DIAGNOSIS — R7989 Other specified abnormal findings of blood chemistry: Secondary | ICD-10-CM

## 2023-07-18 DIAGNOSIS — G2581 Restless legs syndrome: Secondary | ICD-10-CM

## 2023-07-18 NOTE — Progress Notes (Signed)
 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 Pt reports no issues after his last treatment so he will continue 84 mg which is maintenance dose and he will continue as long as tolerating it well. Eric [name] is a patient of Dr. Calhoun so he will follow his care throughout treatments and follow ups. Pt's Spravato is a medical authorization through [employer] and [employer].  Spravato medication is stored at treatment center per REMS/FDA guidelines. The medication is required to be locked behind two doors per REMS/FDA protocol. Medication is also disposed of properly after each use per regulations. All documentation for [employer] is completed and submitted per FDA/REMS requirements.          Began taking patient's vital signs at 9:10 AM 144/72, pulse 67, SpO2 95%. Instructed patient to blow his nose if needed then recline back to a 45 degree angle. Gave patient first dose 28 mg nasal spray, administered in each nostril as directed and observed by nurse, waited 5 more minutes for the second and third doses. After all doses given pt did not complain of any nausea/vomiting. Assessed his 40 minute vitals, 9:55 AM, 144/72, pulse 67, SpO2 95%. Pt reports doing fine, no complaints voiced  Explained he would be monitored for a total time of 120 minutes. Discharge vitals were taken at 11:02 AM 142/74, P 64, SpO2 94%. Dr. Geoffry came to visit with patient once his thoughts were clearer to discuss how treatment went. Recommend he go home and sleep or just relax on the couch. No driving, no intense activities. Verbalized understanding. Pt. will be receiving 2 treatments per week for 4 weeks as recommended. Nurse was with pt a total of 60 minutes for clinical assessment. Pt is always taken down via wheelchair due to pt's unsteady gait prior to treatments. Pt is scheduled  Tuesdays and Thursdays. Instructed to call with any issues.     LOT [id number] EXP FEB 2028

## 2023-07-18 NOTE — Progress Notes (Signed)
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  no changes  01/21/23 appt noted: Worrying too much bc OCD.  Trying to decide about how to deal with a trigger lately.  OCD driving me crazy wanting to make things perfect.   Other than the trigger had a nice time since here.  GS here for 5 days at 73 yo.  Enjoyed that.   Taking semaglutide   down to 250# from 283#. Card at [facility] says he does not have Aortic aneurysm.  Measuring is OK.  Will FU with MRI in June.   Some diarrhea lately. Cannot stop worrying.  Triggered by the plumbing issue. Meds as above.  No SE of sig.   Plan:  switched to duloxetine  to 90 mg daily.  Off Lexapro .  Others as noted. Lyrica  100 mg HS. Has held Ritalin  10 TID, can resume as long as SBP below 140 per card.  none needed ropinirole  3 mg pm.  No risperidone .  02/18/23 appt noted:  with Eric [name] Meds: as above. Taking Ritalin  30% of night.  Prn Xanax  rarely if gets off sleep cycle. No SE.   Terribly dep but not as bad as in the past. Taking Ritalin  appears to help significantly and started doing that.  Tend to stay in bed till noon but pattern of up late.   OCD about the same with some avoidance of detail work.   Afraid I'll throw away something I need.   She doesn't know whether Ritalin  helps No sig RLS and wife agrees. Thinks of deceased B at the holidays but worries over son Eric [name] too.   Plan: Increase duloxetine  to 120 mg daily.  Off Lexapro .  Others as noted. Lyrica  100 mg HS.  resumed Ritalin  30 AM with some benefit. no  risperidone .  03/19/23 appt noted: Psych med:  duloxetine  120, Ritalin  20 am  missing doses, Lyrica  100 HS, no risperidone , no ropinirole .   No improvement in depression since increase dose duloxetine .  More dep than usual.  Worrying about everything.  Including taxes.  All I can see is a black hole.  Making him feel negative about everything.   Keeping him from doing things.   OCD is not much different from when on Lexapro .  Wife agrees dep and distracted. Plan: resumed Ritalin  30 AM with some benefit. Resume Abilify  2 mg AM for dep.  04/16/23 appt noted:  wife here Psych med:  duloxetine  120, resumed Concerta  36 mg AM, Lyrica  100 HS, Abilify  2, no ropinirole .   Wife noted he seemed manic yesterday.  Invited window estimate against wife's will and without her input.   No mania noted until yesterday. He didn't feel manic yesterday.   He's noticed no change with Abilify  and still feels flat and a little low.  From MPH energy and mood a little better.  Sleeps until 10-11 am about 12 hours. And asleep that whole time. Wife says he doesn't eat much bc he's not awake much. Trouble with hygiene.   Plan: Meds: continue duloxetine  to 120 mg daily.  Off Lexapro .  Lyrica  100 mg HS.  resumed Ritalin  30 AM with some benefit. DC Abiilify Vraylar  1.5 mg every other day Spravato disc in detail.  He wants to pursue if not better with Vraylar .  06/18/23 appt noted: with W Med:  duloxetine  120, resumed Concerta  36 mg AM, Lyrica  100 HS, no ropinirole .   Vraylar  1.5 every other day, no benefit Spravato denied by insurance but is being appealed. More trouble walking and shorter gait.  Neuro  in [place] appt in Sept.   Looking to get in in [place].  Can't be much more dep than I am.   OCD residual when has to make a decision.   ED is more of a px. Reduced voice family. Plan : Spravato  06/25/23 appt noted: with W Med:  duloxetine  120, Concerta  36 mg AM, Lyrica  100 HS, no ropinirole .   Vraylar  1.5 daily No SE Received Spravato 56 mg today.  Experienced very mild dissociation and no sig HA, N.  But some dizziness.  Resolved by end of 2 hours observation.  Able to leave office without assistance.  Ongoing dep without change.  Slow, reduced cognition, anhedonia, forgetful, low motivation. No other concerns with meds.   06/27/23 appt noted:  Med: Med:  duloxetine  120, Concerta  36 mg AM, Lyrica  100 HS, no ropinirole .   Stopped Vraylar  1.5 daily No SE Received Spravato 84 mg todayfor the first time..  Experienced very mild dissociation and no sig HA, N.  But some dizziness.  Resolved by end of 2 hours observation.  Able to leave office without assistance.  Ongoing dep without change.  Slow, reduced cognition, anhedonia, forgetful, low motivation. No other concerns with meds.   07/02/23 appt oted: Med: Med:  duloxetine  90, Concerta  36 mg AM, Lyrica  100 HS, no ropinirole .   Stopped Vraylar  1.5 daily No SE Received Spravato 84 mg today.  Experienced very mild dissociation and no sig HA, N.  But some dizziness.  Resolved by end of 2 hours observation.  Mildly drowsy at end of session. BC of gait px he needed to use WC to leave the office.  Per wife gait gradually worsened over a couple of years but accelerated recently in 3 mos or  so.  Short gait.  Not due to pain.  Pending neuro appt.  MRI last week not read yet. No problems with meds. Wife noticed he's shown more initiative at home since [place] ex doing crossword puzzles.  More engaged.  He feels lighter already with it.  07/15/23 appt noted:  Med: Med:  duloxetine  90, Concerta  36 mg AM, Lyrica  100 HS, no ropinirole .  No SE Received Spravato  84 mg today.  Experienced very mild dissociation and no sig HA, N.  But some dizziness.  Resolved by end of 2 hours observation.  Mildly drowsy at end of session. BC of gait px he needed to use WC to leave the office. Seen with W.   W noted clear benefit Spravato with stronger voice, spontaneous chores he wasn't doing.  More engaged in coverstation.  Pt agrees. Disc concerns over gait px and his neuro visit and MRI scan suggesting Fahr's DZ.  He'll discuss further with DR. Tat tomorrow.  07/18/23 appt noted:  Med: Med:  duloxetine  90, Concerta  36 mg AM, Lyrica  100 HS, no ropinirole .  No SE Received Spravato 84 mg today.  Experienced very mild dissociation and no sig HA, N.  But some dizziness.  Resolved by end of 2 hours observation.  Mildly drowsy at end of session. BC of gait px he needed to use WC to leave the office. No med concerns.  Seen with W.    Both agree dep is better .  More affective range.  More socially engaged.  Quicker thought.  More active .  Less down and less negative.    ECT-MADRS    Flowsheet Row Office Visit from 06/18/2023 in [facility] Office Visit from 04/16/2023 in [facility]  MADRS Total Score 37 36   PHQ2-9    Flowsheet Row Office Visit from 03/15/2020 in [facility] Total Score 3  PHQ-9 Total Score 9     B schizophrenic SUI. After M's death. PCP Cecil Ee at [facility] at 73 years old.  Prior psychiatric medication trials include  Lexapro  20, citalopram NR, clomipramine weight gain, paroxetine, fluoxetine, Luvox, Trintellix,   Increase Lexapro  back to 20 mg January 2020. & 10/2020 bupropion ,  Auvelity NR  Abilify  10 fatigue,  Cerefolin NAC, and   Naltrexone  sexual SE Lyrica  150 tired  pramipexole,  ropinirole  Adderall XR & IR sexual SE,  Ritalin  30, Concerta  72 mg AM NR modafinil  and Nuvigil,   History [employer] OCD  Review of Systems:  Review of Systems  Constitutional:  Positive for fatigue. Negative for fever.  Cardiovascular:  Negative for chest pain and palpitations.  Genitourinary:        ED  Musculoskeletal:  Positive for arthralgias, gait problem and myalgias.  Neurological:  Negative for tremors.  Psychiatric/Behavioral:  Positive for dysphoric mood and sleep disturbance. Negative for agitation, behavioral problems, confusion, decreased concentration, hallucinations, self-injury and suicidal ideas. The patient is nervous/anxious. The patient is not hyperactive.     Medications: I have reviewed the patient's current medications.  Current Outpatient Medications  Medication Sig Dispense Refill   ALPRAZolam  (XANAX ) 0.25 MG tablet TAKE 1 TABLET (0.25 MG TOTAL) BY MOUTH 2 (TWO) TIMES DAILY AS NEEDED FOR ANXIETY OR SLEEP. 30 tablet 1   aspirin 81 MG tablet Take 81 mg by mouth daily.     Cholecalciferol (VITAMIN D-3) 5000 units TABS Take 5,000 Units by mouth daily.  DULoxetine  (CYMBALTA ) 30 MG capsule Take 3 capsules (90 mg total) by mouth daily. 270 capsule 0   methylphenidate  36 MG PO CR tablet Take 1 tablet (36 mg total) by mouth daily. 30 tablet 0   naltrexone  (DEPADE) 50 MG tablet Take 0.5 tablets (25 mg total) by mouth daily. 45 tablet 1   pregabalin  (LYRICA ) 50 MG capsule Take 1 capsule (50 mg total) by mouth at bedtime. 90 capsule 0   simvastatin (ZOCOR) 20 MG tablet Take 20 mg by mouth at bedtime.     ZEPBOUND  10 MG/0.5ML Pen Inject 10 mg into the skin once a week.     Semaglutide ,0.25 or 0.5MG /DOS, (OZEMPIC , 0.25 OR 0.5 MG/DOSE,) 2 MG/1.5ML SOPN Inject 2.4 mg into the skin once a week.     No current facility-administered medications for this visit.    Medication Side Effects: None sexual SE are better not  All gone.  Allergies:  Allergies  Allergen Reactions   E.E.S. [Erythromycin] Hives   Macrolides And Ketolides Other (See Comments)    EES    Rosuvastatin      Other reaction(s): cramps    Past Medical History:  Diagnosis Date   Allergy    Anemia    iron- pt denies    Anxiety    Aortic cusp regurgitation    Carotid artery occlusion    Constipation    Coronary artery stenosis    Hyperlipidemia    Lactose intolerance    Major depression, recurrent, chronic (HCC)    Obesity    OCD (obsessive compulsive disorder)    OSA (obstructive sleep apnea)    Other chronic pain    Periodic limb movement disorder    Periodic limb movements of sleep    Prediabetes    Pure hypercholesterolemia    Restless legs    Sleep apnea    wears cpap    Vitamin D deficiency     Family History  Problem Relation Age of Onset   Cancer Mother        breast and ovarian   Anxiety disorder Mother    Breast cancer Mother    Ovarian cancer Mother    Depression Father        bi-polar   Hyperlipidemia Father    Heart disease Father    Sudden death Father    Bipolar disorder Father    Sleep apnea Father    Obesity Father    Depression Son    Colon cancer Neg Hx    Colon polyps Neg Hx    Esophageal cancer Neg Hx    Rectal cancer Neg Hx    Stomach cancer Neg Hx     Social History   Socioeconomic History   Marital status: Married    Spouse name: Not on file   Number of children: Not on file   Years of education: Not on file   Highest education level: Not on file  Occupational History   Occupation: retired Pensions consultant  Tobacco Use   Smoking status: Never   Smokeless tobacco: Never  Substance and Sexual Activity   Alcohol use: Yes    Comment: occasionally    Drug use: No   Sexual activity: Not on file  Other Topics Concern   Not on file  Social History Narrative   Not on file   Social Drivers of Health   Financial Resource Strain: Not on file  Food Insecurity: No Food Insecurity (01/25/2022)   Received from [facility]  visits prior to 03/24/2022[facility]   Hunger Vital Sign    Worried About Running Out of Food in the  Last Year: Never true    Ran Out of Food in the Last Year: Never true  Transportation Needs: No Transportation Needs (01/25/2022)   Received from [facility] visits prior to 03/24/2022.   PRAPARE - Administrator, Civil Service (Medical): No    Lack of Transportation (Non-Medical): No  Physical Activity: Not on file  Stress: Not on file  Social Connections: Not on file  Intimate Partner Violence: Not on file    Past Medical History, Surgical history, Social history, and Family history were reviewed and updated as appropriate.   Please see review of systems for further details on the patient's review from today.   Objective:   Physical Exam:  There were no vitals taken for this visit.  Physical Exam Constitutional:      General: He is not in acute distress.    Appearance: He is obese.   Musculoskeletal:        General: No deformity.   Neurological:     Mental Status: He is alert and oriented to person, place, and time.     Cranial Nerves: No dysarthria.     Comments: Shortened gait.  No sig tremor.  Slow.  Psychiatric:        Attention and Perception: Attention and perception normal. He does not perceive auditory or visual hallucinations.        Mood and Affect: Mood is anxious and depressed. Affect is not labile, blunt, angry or inappropriate.        Speech: Speech normal.        Behavior: Behavior is slowed. Behavior is cooperative.        Thought Content: Thought content normal. Thought content is not paranoid or delusional. Thought content does not include homicidal or suicidal ideation. Thought content does not include suicidal plan.        Cognition and Memory: Cognition and memory normal.        Judgment: Judgment normal.     Comments: Insight intact depression and fatigue 50% better OCD is about the same and not the main problem More expressive and normal affect.    November 06, 2018: [place] Cog test in office within  normal limits MMSE 28/30. Animal fluency 17 . (borderline) Taken as a whole, no indication to pursue neuropsychological testing.  Mini-Mental status exam 28/30 on 10/27/20.  No evidence of dementia.  Lab Review:   Vitamin D level acceptable at 54.5.   Normal B12 and folate and TSH in last couple of years..  Echocardiogram is stable re: AVR over the last 8 years and not likely the cause of lethargy.   06/28/23 MRI head:  IMPRESSION: 1. No acute intracranial abnormality. 2. 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 Head CT may be helpful for further assessment of the degree of mineralization. 3. Findings of chronic small vessel ischemia.  SABRAres Assessment: Plan:    Elio was seen today for follow-up, depression, anxiety, fatigue and sleeping problem.  Diagnoses and all orders for this visit:  Recurrent major depression resistant to treatment [facility])  Mixed obsessional thoughts and acts  Attention deficit hyperactivity disorder (ADHD), predominantly inattentive type  Hypersomnia with sleep apnea  Low vitamin D level  Obstructive sleep apnea  Mild cognitive impairment  Restless legs  syndrome  Erectile disorder, acquired, generalized, moderate     Mr. Dennington has a long history of depression and OCD.  Major depression with fatigue, cognitive and physical slowness, anhedonia is much worse.  Started Spravato  and improving.  Was better energy with duloxetine  90 vs Lexapro  so increased to 120 mg daily DT worsening depression.    But it was not better. So reduced it back to 90 mg daily. Dep is much worse, in fact, his worst depression ever. pursue Spravato  He has some compulsive checking and obsessions around the house maintenance.  When travels then tends to have less OCD bc triggered less.    Patient was administered Spravato 84 mg intranasally  today.  The patient experienced the typical dissociation which gradually resolved over the 2-hour period of observation.  There were no complications.  Specifically the patient did not have nausea or vomiting or headache.  Blood pressures remained within normal ranges at the 40-minute and 2-hour follow-up intervals.  By the time the 2-hour observation period was met the patient was alert and oriented and able to exit without assistance.  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  See nursing note for further details. Emphasized need to relax with Spravato and not return calls, read, return chats, emails etc. Started Spravato 06/25/23  His OCD is persistent with checking lites, stove, etc. .  It is better at the moment DT depression being worse.  Disc CBT techniques and potential for more therapy to address. Option Dr. Marijean.  Continue recent retrial Concerta  36 mg AM He wants to use it for depression and cognitive concerns.  Discussed potential benefits, risks, and side effects of stimulants with patient to include increased heart rate, palpitations, insomnia, increased anxiety, increased irritability, or decreased appetite.  Instructed patient to contact office if experiencing any significant tolerability issues.  Extensive discussion of sleep study and he has a copy.  Why is there virtually no N3 & REM sleep?  Is that affecting daytime alertness and fatigue?  Vs how much is related to mild OSA and PLMS?  Disc this in detail.  Wrote coreespondence with sleep doc about it.  Options for sleep & fatigue:  Difficult to assess  bc has been out of country and then sick for 3 weeks.   Focus on reduction in OSA Focus on improving deep stage sleep.  ? Rx low dose mirtazapine or alternatives Focus on leg movements (hx RLS/PLMS) continue Trial as had been suggested Lyrica  for FM type sx and may help RLS/PLMS.  Better  dreaming on 100 mg HS and too sleepy on 150.  Decreased  to 100 mg HS for sleep and back pain and RLS No ropinirole  needed.   Disc SE.  Not having any.   Use LED Xanax  and try to avoid BZ daytime bc fatigue.  He doesn't use it much daythime.  Using 0..25-0.5 mg at night.  May need less with more Lyrica  at HS and try to minimize.  No hangover. We discussed the short-term risks associated with benzodiazepines including sedation and increased fall risk among others.  Discussed long-term side effect risk including dependence, potential withdrawal symptoms, and the potential eventual dose-related risk of dementia.  But recent studies from 2020 dispute this association between benzodiazepines and dementia risk. Newer studies in 2020 do not support an association with dementia.  Stimulant partially successfully used off label to augment antidepressants for depression and have  resulted in improved productivity and attention.    Previous screening of memory was not suggestive of any neuro degenerative process. Mini-Mental status exam 28/30 on 10/27/20.   Recent cognition worse as dep worsened.  Also gait is short and slow.  Pending neuro appt.  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  Plan:  no med changes.  Meds:  continue duloxetine  to 120 mg daily.  Off Lexapro .  Lyrica  100 mg HS.  resumed MPH ER 36 mg AM   Spravato disc in detail.  He wants to continue.  He and wife notice he's brigher .  Administer twice weekly for at least a month.  Will administer at 84 mg .   Follow-up   Lorene Macintosh MD, DFAPA.  Please see After Visit Summary for patient specific instructions.  Future Appointments  Date Time Provider Department Center  07/19/2023  4:30 PM GI-315 CT 2 GI-315CT [facility] [facility]  07/23/2023 11:45 AM Annett Sheffield SAILOR, PT [facility]  08/13/2023  1:30 PM Cottle, Lorene KANDICE Raddle., MD [facility] None  09/17/2023  1:00 PM Cottle, Lorene KANDICE Raddle., MD [facility] None  10/15/2023  1:30 PM Cottle, Lorene KANDICE Raddle., MD  CP-CP None  01/30/2024  3:00 PM Tat, Asberry RAMAN, DO [id number] None     No orders of the defined types were placed in this encounter.      -------------------------------

## 2023-07-19 ENCOUNTER — Ambulatory Visit
Admission: RE | Admit: 2023-07-19 | Discharge: 2023-07-19 | Disposition: A | Discharge status: Home / Self Care | Source: Ambulatory Visit | Attending: Neurology | Admitting: Neurology

## 2023-07-19 DIAGNOSIS — R9402 Abnormal brain scan: Secondary | ICD-10-CM

## 2023-07-19 DIAGNOSIS — R9089 Other abnormal findings on diagnostic imaging of central nervous system: Secondary | ICD-10-CM | POA: Diagnosis not present

## 2023-07-19 DIAGNOSIS — R9389 Abnormal findings on diagnostic imaging of other specified body structures: Secondary | ICD-10-CM

## 2023-07-22 NOTE — Therapy (Signed)
 OUTPATIENT PHYSICAL THERAPY NEURO EVALUATION   Patient Name: Eric [name] MRN: [id number] DOB:20-Feb-1950, 73 y.o., male Today's Date: 07/23/2023   PCP: Regino Slater, MD   REFERRING PROVIDER: Evonnie Asberry RAMAN, DO    END OF SESSION:  PT End of Session - 07/23/23 1147     Visit Number 1    Number of Visits 13    Date for PT Re-Evaluation 09/21/23    Authorization Type [employer]    PT Start Time 1144    PT Stop Time 1230    PT Time Calculation (min) 46 min    Equipment Utilized During Treatment Gait belt    Activity Tolerance Patient tolerated treatment well    Behavior During Therapy WFL for tasks assessed/performed          Past Medical History:  Diagnosis Date   Allergy    Anemia    iron- pt denies    Anxiety    Aortic cusp regurgitation    Carotid artery occlusion    Constipation    Coronary artery stenosis    Hyperlipidemia    Lactose intolerance    Major depression, recurrent, chronic (HCC)    Obesity    OCD (obsessive compulsive disorder)    OSA (obstructive sleep apnea)    Other chronic pain    Periodic limb movement disorder    Periodic limb movements of sleep    Prediabetes    Pure hypercholesterolemia    Restless legs    Sleep apnea    wears cpap    Vitamin D deficiency    Past Surgical History:  Procedure Laterality Date   COLONOSCOPY     DG BARIUM ENEMA ([facility] HX)     10-19-2010- turtous, elongated colon    HEMORRHOID SURGERY     TONSILLECTOMY  1960   Patient Active Problem List   Diagnosis Date Noted   Vitamin D deficiency 03/30/2020   Pure hypercholesterolemia 03/30/2020   Prediabetes 03/30/2020   Occlusion and stenosis of bilateral carotid arteries 03/30/2020   Obstructive sleep apnea syndrome 03/30/2020   Aortic cusp regurgitation 03/30/2020   Restless legs syndrome 03/30/2020   Moderate recurrent major depression (HCC) 03/30/2020   Chronic pain 03/30/2020   OCD (obsessive compulsive disorder)  10/15/2017   Family history of ovarian cancer 04/19/2011   Family history of breast cancer 04/19/2011    ONSET DATE: 07/12/2023   REFERRING DIAG: G20.C (ICD-10-CM) - Primary parkinsonism (HCC)   THERAPY DIAG:  Unsteadiness on feet  Other abnormalities of gait and mobility  Abnormal posture  Muscle weakness (generalized)  Rationale for Evaluation and Treatment: Rehabilitation  SUBJECTIVE:  SUBJECTIVE STATEMENT: Per pt's spouse, has always been a slow walker for the past couple of years. In the past 2-3 months, has really shuffled his steps and unable to pick his feet up. Reports it feels like his gait is getting better since being off the Vraylar . Per wife his expressions are more animated now. Reports some weird tongue movements that are doing better as well. Works with a Psychologist, educational (same Psychologist, educational for 10 years), mainly works on some walking. Ambulates in with [facility], has been using it for about a month. Doesn't use it around the house much, just uses it out in the community. Pt reports no falls where he hits the floor, has reached out to walls and such. These happen mainly with quick turns. Taking Spravato, 2 times a week since the beginning of June. Reports it can make him feel woozy. Goes to the doctor's office at [facility] to take it. Per pt's wife he is more chipper and not as tired. Reports his walking last felt normal about 2 years ago   Pt accompanied by: Alfreda Shuck  PERTINENT HISTORY: PMH: Parkinsonism (Possibly due to exposure to antipsychotic medication), tardive dyskinesias, restless leg syndrome, CAD, pre-diabetes, obesity, OCD, major depression,HLD  He was not exposed to anything for very long, but wife reports that symptoms have really been developing over the last 5 to 6 weeks. He  was on Abilify  for a very short time in February and March and then was on Vraylar  from March until June. The symptoms developed around that time. He is off of the Vraylar  now (just went off of it)   Per Dr. Evonnie regarding abnormal brain scan:  It does appear that there is heavy basal ganglia calcification.  We can certainly confirm this with CT.  While Fahrs disease is on the differential, the timeline for his symptoms really correlates more with the timeline for when he was placed on antipsychotic medication.   PAIN:  Are you having pain? No Has generalized body pain that is taken care of the [employer] , reports R knee is the bad one  Vitals:   07/23/23 1207  BP: 130/83  Pulse: 69     PRECAUTIONS: Fall  FALLS: Has patient fallen in last 6 months? No and almost falls grabbing the wall   LIVING ENVIRONMENT: Lives with: lives with their spouse Lives in: House/apartment Stairs: Yes: Internal: 12 steps; on left going up and External: 4 and 5 steps; none Has following equipment at home: Single point cane and Grab bars  PLOF: Independent and Leisure: doesn't have any hobbies except cleaning up the house  PATIENT GOALS: Wants to get back to his regular gait and his regular walk so he can go on trips   OBJECTIVE:  Note: Objective measures were completed at Evaluation unless otherwise noted.  DIAGNOSTIC FINDINGS: MRI brain: IMPRESSION: 1. No acute intracranial abnormality. 2. 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 Head CT may be helpful for further assessment of the degree of mineralization. 3. Findings of chronic small vessel ischemia.  COGNITION: Overall cognitive status: Within functional limits for tasks assessed   SENSATION: WFL  COORDINATION: Heel to shin: some bradykinesia RLE RAM: dysmetric BUE     POSTURE: rounded shoulders, forward head, and  posterior pelvic tilt   LOWER EXTREMITY MMT:    MMT Right Eval Left Eval  Hip flexion 3- 4  Hip extension    Hip abduction  Hip adduction    Hip internal rotation    Hip external rotation    Knee flexion 4 5  Knee extension 4 4  Ankle dorsiflexion 5 5  Ankle plantarflexion    Ankle inversion    Ankle eversion    (Blank rows = not tested)  BED MOBILITY:   Pt reports some difficulties getting under the sheets    TRANSFERS: Sit to stand: Modified independence  Assistive device utilized: None     Stand to sit: Modified independence  Assistive device utilized: None     Pt reports difficulties getting up from lower surfaces or in and out of the car.   GAIT: Findings: Gait Characteristics: decreased arm swing- Right, decreased arm swing- Left, decreased step length- Right, decreased step length- Left, decreased stride length, decreased hip/knee flexion- Right, decreased hip/knee flexion- Left, decreased ankle dorsiflexion- Right, decreased ankle dorsiflexion- Left, Right foot flat, Left foot flat, shuffling, decreased trunk rotation, trunk flexed, and narrow BOS, Distance walked: Clinic distances, Assistive device utilized:Single point cane and None, Level of assistance: CGA, and Comments: Pt with very shuffled steps. When pt gets single HHA from therapist for a couple steps, pt able to demo improved step length   FUNCTIONAL TESTS:  5 times sit to stand: 15.6 seconds with no UE support, incr forward flexed posture in standing  Timed up and go (TUG): 27.4 seconds with no AD, CGA with en bloc turning 10 meter walk test: 62 seconds with no AD and CGA = .52 ft/sec                                                                                                                                TREATMENT DATE: 07/23/23  Self-Care: Educated on fall risk and clinical findings, and that due to fall risk pt should be using an AD at all times. Discussed at this time is recommending a rollator  or RW and will trial at next session. Discussed with gait with improved stride length can improve gait efficiency compared to shuffled gait  Provided education regarding drug induced Parkinson's and answered pt's questions - PT unable to give pt answer on when symptoms will improve after stopping medication as each case is different  Reviewed MRI findings that pt went over with Dr. Evonnie and that CT scan results not back in yet regarding the heavy basal ganglia calcification that was found on MRI    PATIENT EDUCATION: Education details: See Self-Care, clinical findings, POC  Person educated: Patient and Spouse Education method: Explanation Education comprehension: verbalized understanding  HOME EXERCISE PROGRAM: Will provide at future session   GOALS: Goals reviewed with patient? Yes  SHORT TERM GOALS: Target date: 08/13/2023   1. Pt will be independent with initial HEP with gait, balance, strength in order to build upon functional gains made in therapy. Baseline: Goal status: INITIAL  2.  Pt will improve TUG time to 22 seconds or less with no  AD vs. LRAD in order to demo decrease fall risk. Baseline: 27.4 seconds with no AD, CGA Goal status: INITIAL  3.  Pt will improve gait speed with LRAD to at least 1.3 ft/sec in order to demo decr fall risk/improved household mobility.  Baseline: 62 seconds with no AD = .52 ft/sec and CGA Goal status: INITIAL  4.  Pt will ambulate at least 230' with supervision with LRAD over level indoor surfaces in order to demo improved household mobility.  Baseline: pt ambulates with no AD with CGA  Goal status: INITIAL   LONG TERM GOALS: Target date: 09/03/2023   Pt will be independent with final HEP with gait, balance, strength in order to build upon functional gains made in therapy. Baseline:  Goal status: INITIAL  2.  Pt will improve TUG time to 17 seconds or less with no AD vs. LRAD in order to demo decrease fall risk. Baseline: 27.4 seconds with  no AD, CGA Goal status: INITIAL  3.  Pt will improve 5x sit<>stand to less than or equal to 13 sec to demonstrate improved functional strength and transfer efficiency.  Baseline: 15.6 seconds with no UE support, incr forward flexed posture in standing Goal status: INITIAL  4.  Pt will improve gait speed with LRAD to at least 1.8 ft/sec in order to demo decr fall risk  Baseline: 62 seconds with no AD = .52 ft/sec and CGA Goal status: INITIAL  5.  Pt will ambulate at least 500' with supervision with LRAD over outdoor unlevel surfaces in order to demo improved community mobility.  Baseline:  Goal status: INITIAL    ASSESSMENT:  CLINICAL IMPRESSION: Patient is a 73 year old male referred to Neuro OPPT for Parkinsonism (Possibly due to exposure to antipsychotic medication). Pt's symptoms started when he was on Abilify  for a very short time in February and March and then was on Vraylar  from March until June. Pt reports he does notice his walking getting better, but does endorse gait difficulties for the past 2 years. Pt's PMH is significant for:  tardive dyskinesias, restless leg syndrome, CAD, pre-diabetes, obesity, OCD, major depression,HLD. The following deficits were present during the exam: impaired balance, gait abnormalities, decr strength, shuffled gait, postural abnormalities, decr activity tolerance, impaired coordination. Based on TUG, gait speed, and 5x sit <> stand, pt is an incr risk for falls. Pt would benefit from skilled PT to address these impairments and functional limitations to maximize functional mobility independence and decr fall risk.   OBJECTIVE IMPAIRMENTS: Abnormal gait, decreased activity tolerance, decreased balance, decreased coordination, decreased knowledge of condition, decreased knowledge of use of DME, decreased mobility, difficulty walking, decreased strength, decreased safety awareness, impaired flexibility, and postural dysfunction.   ACTIVITY LIMITATIONS:  transfers, bed mobility, and locomotion level  PARTICIPATION LIMITATIONS: shopping, community activity, and yard work  PERSONAL FACTORS: Age, Behavior pattern, Past/current experiences, Time since onset of injury/illness/exacerbation, and 3+ comorbidities: arkinsonism (Possibly due to exposure to antipsychotic medication), tardive dyskinesias, restless leg syndrome, CAD, pre-diabetes, obesity, OCD, major depression,HLD are also affecting patient's functional outcome.   REHAB POTENTIAL: Good  CLINICAL DECISION MAKING: Evolving/moderate complexity  EVALUATION COMPLEXITY: Moderate  PLAN:  PT FREQUENCY: 2x/week  PT DURATION: 8 weeks  PLANNED INTERVENTIONS: 97164- PT Re-evaluation, 97110-Therapeutic exercises, 97530- Therapeutic activity, 97112- Neuromuscular re-education, 97535- Self Care, [id number]- Manual therapy, [phone]- Gait training, Patient/Family education, Balance training, Stair training, and DME instructions  PLAN FOR NEXT SESSION: try RW or rollator, initial HEP - try standing PWR moves, sit <>  stands, work on [employer], PT,DPT 07/23/2023, 1:24 PM

## 2023-07-23 ENCOUNTER — Encounter: Payer: Self-pay | Admitting: Physical Therapy

## 2023-07-23 ENCOUNTER — Ambulatory Visit

## 2023-07-23 ENCOUNTER — Ambulatory Visit: Attending: Neurology | Admitting: Physical Therapy

## 2023-07-23 ENCOUNTER — Ambulatory Visit (INDEPENDENT_AMBULATORY_CARE_PROVIDER_SITE_OTHER): Admitting: Psychiatry

## 2023-07-23 VITALS — BP 130/71 | HR 69

## 2023-07-23 VITALS — BP 130/83 | HR 69

## 2023-07-23 DIAGNOSIS — R293 Abnormal posture: Secondary | ICD-10-CM | POA: Diagnosis not present

## 2023-07-23 DIAGNOSIS — M6281 Muscle weakness (generalized): Secondary | ICD-10-CM | POA: Insufficient documentation

## 2023-07-23 DIAGNOSIS — F331 Major depressive disorder, recurrent, moderate: Secondary | ICD-10-CM

## 2023-07-23 DIAGNOSIS — R2681 Unsteadiness on feet: Secondary | ICD-10-CM | POA: Diagnosis not present

## 2023-07-23 DIAGNOSIS — R2689 Other abnormalities of gait and mobility: Secondary | ICD-10-CM | POA: Insufficient documentation

## 2023-07-23 DIAGNOSIS — G4710 Hypersomnia, unspecified: Secondary | ICD-10-CM

## 2023-07-23 DIAGNOSIS — G3184 Mild cognitive impairment, so stated: Secondary | ICD-10-CM

## 2023-07-23 DIAGNOSIS — F339 Major depressive disorder, recurrent, unspecified: Secondary | ICD-10-CM

## 2023-07-23 DIAGNOSIS — F422 Mixed obsessional thoughts and acts: Secondary | ICD-10-CM

## 2023-07-23 DIAGNOSIS — G4733 Obstructive sleep apnea (adult) (pediatric): Secondary | ICD-10-CM

## 2023-07-23 DIAGNOSIS — F900 Attention-deficit hyperactivity disorder, predominantly inattentive type: Secondary | ICD-10-CM

## 2023-07-23 DIAGNOSIS — R7989 Other specified abnormal findings of blood chemistry: Secondary | ICD-10-CM

## 2023-07-23 DIAGNOSIS — G20C Parkinsonism, unspecified: Secondary | ICD-10-CM | POA: Diagnosis not present

## 2023-07-23 NOTE — Progress Notes (Signed)
 NURSES NOTE:         Pt arrived for his 8 th Spravato Treatment for treatment resistant depression, the starting dose was 56 mg (2 of the 28 mg nasal sprays) and he tolerated it well with no side effects or complaints. Pt reports no issues after his last treatment so he will continue 84 mg which is maintenance dose and he will continue as long as tolerating it well. Eric [name] is a patient of Dr. Calhoun so he will follow his care throughout treatments and follow ups. Pt's Spravato is a medical authorization through buy and bill.  Spravato medication is stored at treatment center per REMS/FDA guidelines. The medication is required to be locked behind two doors per REMS/FDA protocol. Medication is also disposed of properly after each use per regulations. All documentation for REMS is completed and submitted per FDA/REMS requirements.          Began taking patient's vital signs at 9:05 AM 138/79, pulse 76, SpO2 95%. Instructed patient to blow his nose if needed then recline back to a 45 degree angle. Gave patient first dose 28 mg nasal spray, administered in each nostril as directed and observed by nurse, waited 5 more minutes for the second and third doses. After all doses given pt did not complain of any nausea/vomiting. Assessed his 40 minute vitals, 9:50 AM, 146/79, pulse 86, SpO2 93%. Pt reports doing fine, no complaints voiced  Explained he would be monitored for a total time of 120 minutes. Discharge vitals were taken at 11:02 AM 130/71, P 69, SpO2 93%. Dr. Geoffry came to visit with patient once his thoughts were clearer to discuss how treatment went. Recommend he go home and sleep or just relax on the couch. No driving, no intense activities. Verbalized understanding. Pt. will be receiving 2 treatments per week for 4 weeks as recommended. Nurse was with pt a total of 60 minutes for clinical assessment. Pt is always taken down via wheelchair due to pt's unsteady gait prior to treatments. Pt is scheduled  Tuesdays and Thursdays. Instructed to call with any issues.     LOT [id number] EXP FEB 2028

## 2023-07-24 DIAGNOSIS — N3946 Mixed incontinence: Secondary | ICD-10-CM | POA: Diagnosis not present

## 2023-07-24 MED ORDER — METHYLPHENIDATE HCL ER (OSM) 36 MG PO TBCR: 36.0000 mg | EXTENDED_RELEASE_TABLET | Freq: Every day | ORAL | 0 refills | Status: DC

## 2023-07-24 NOTE — Progress Notes (Signed)
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  no changes  01/21/23 appt noted: Worrying too much bc OCD.  Trying to decide about how to deal with a trigger lately.  OCD driving me crazy wanting to make things perfect.   Other than the trigger had a nice time since here.  GS here for 5 days at 73 yo.  Enjoyed that.   Taking semaglutide   down to 250# from 283#. Card at [facility] says he does not have Aortic aneurysm.  Measuring is OK.  Will FU with MRI in June.   Some diarrhea lately. Cannot stop worrying.  Triggered by the plumbing issue. Meds as above.  No SE of sig.   Plan:  switched to duloxetine  to 90 mg daily.  Off Lexapro .  Others as noted. Lyrica  100 mg HS. Has held Ritalin  10 TID, can resume as long as SBP below 140 per card.  none needed ropinirole  3 mg pm.  No risperidone .  02/18/23 appt noted:  with [name] Meds: as above. Taking Ritalin  30% of night.  Prn Xanax  rarely if gets off sleep cycle. No SE.   Terribly dep but not as bad as in the past. Taking Ritalin  appears to help significantly and started doing that.  Tend to stay in bed till noon but pattern of up late.   OCD about the same with some avoidance of detail work.   Afraid I'll throw away something I need.   She doesn't know whether Ritalin  helps No sig RLS and wife agrees. Thinks of deceased B at the holidays but worries over son Deward too.   Plan: Increase duloxetine  to 120 mg daily.  Off Lexapro .  Others as noted. Lyrica  100 mg HS.  resumed Ritalin  30 AM with some benefit. no risperidone .  03/19/23 appt noted: Psych med:  duloxetine  120, Ritalin  20 am  missing doses, Lyrica  100 HS, no risperidone , no ropinirole .   No improvement in depression since increase dose duloxetine .  More dep than usual.  Worrying about everything.  Including taxes.  All I can see is a black hole.  Making him feel negative about everything.   Keeping him from doing things.   OCD is not much different from when on Lexapro .  Wife agrees dep and distracted. Plan: resumed Ritalin  30 AM with some benefit. Resume Abilify  2 mg AM for dep.  04/16/23 appt noted:  wife here Psych med:  duloxetine  120, resumed Concerta  36 mg AM, Lyrica  100 HS, Abilify  2, no ropinirole .   Wife noted he seemed manic yesterday.  Invited window estimate against wife's will and without her input.   No mania noted until yesterday. He didn't feel manic yesterday.   He's noticed no change with Abilify  and still feels flat and a little low.  From MPH energy and mood a little better.  Sleeps until 10-11 am about 12 hours. And asleep that whole time. Wife says he doesn't eat much bc he's not awake much. Trouble with hygiene.   Plan: Meds: continue duloxetine  to 120 mg daily.  Off Lexapro .  Lyrica  100 mg HS.  resumed Ritalin  30 AM with some benefit. DC Abiilify Vraylar  1.5 mg every other day Spravato disc in detail.  He wants to pursue if not better with Vraylar .  06/18/23 appt noted: with W Med:  duloxetine  120, resumed Concerta  36 mg AM, Lyrica  100 HS, no ropinirole .   Vraylar  1.5 every other day, no benefit Spravato denied by insurance but is being appealed. More trouble walking and shorter gait.  Neuro  in [place] appt in Sept.   Looking to get in in [place].  Can't be much more dep than I am.   OCD residual when has to make a decision.   ED is more of a px. Reduced voice family. Plan : Spravato  06/25/23 appt noted: with W Med:  duloxetine  120, Concerta  36 mg AM, Lyrica  100 HS, no ropinirole .   Vraylar  1.5 daily No SE Received Spravato 56 mg today.  Experienced very mild dissociation and no sig HA, N.  But some dizziness.  Resolved by end of 2 hours observation.  Able to leave office without assistance.  Ongoing dep without change.  Slow, reduced cognition, anhedonia, forgetful, low motivation. No other concerns with meds.   06/27/23 appt noted:  Med: Med:  duloxetine  120, Concerta  36 mg AM, Lyrica  100 HS, no ropinirole .   Stopped Vraylar  1.5 daily No SE Received Spravato 84 mg todayfor the first time..  Experienced very mild dissociation and no sig HA, N.  But some dizziness.  Resolved by end of 2 hours observation.  Able to leave office without assistance.  Ongoing dep without change.  Slow, reduced cognition, anhedonia, forgetful, low motivation. No other concerns with meds.   07/02/23 appt oted: Med: Med:  duloxetine  90, Concerta  36 mg AM, Lyrica  100 HS, no ropinirole .   Stopped Vraylar  1.5 daily No SE Received Spravato 84 mg today.  Experienced very mild dissociation and no sig HA, N.  But some dizziness.  Resolved by end of 2 hours observation.  Mildly drowsy at end of session. BC of gait px he needed to use WC to leave the office.  Per wife gait gradually worsened over a couple of years but accelerated recently in 3 mos or so.  Short gait.  Not due to pain.  Pending neuro appt.  MRI last week not read yet. No problems with meds. Wife noticed he's shown more initiative at home since [place] ex doing crossword puzzles.  More engaged.  He feels lighter already with it.  07/15/23 appt noted:  Med: Med:  duloxetine  90, Concerta  36 mg AM, Lyrica  100 HS, no ropinirole .  No SE Received Spravato  84 mg today.  Experienced very mild dissociation and no sig HA, N.  But some dizziness.  Resolved by end of 2 hours observation.  Mildly drowsy at end of session. BC of gait px he needed to use WC to leave the office. Seen with W.   W noted clear benefit Spravato with stronger voice, spontaneous chores he wasn't doing.  More engaged in coverstation.  Pt agrees. Disc concerns over gait px and his neuro visit and MRI scan suggesting Fahr's DZ.  He'll discuss further with DR. Tat tomorrow.  07/18/23 appt noted:  Med: Med:  duloxetine  90, Concerta  36 mg AM, Lyrica  100 HS, no ropinirole .  No SE Received Spravato 84 mg today.  Experienced very mild dissociation and no sig HA, N.  But some dizziness.  Resolved by end of 2 hours observation.  Mildly drowsy at end of session. BC of gait px he needed to use WC to leave the office. No med concerns.  Seen with W.    Both agree dep is better .  More affective range.  More socially engaged.  Quicker thought.  More active .  Less down and less negative.  07/23/23 appt noted: Med: Med:  duloxetine  90, Concerta  36 mg AM, Lyrica  100 HS, no ropinirole .  No SE Received Spravato 84 mg today.  Experienced very mild dissociation and no sig HA, N.  But some dizziness.  Resolved by end of 2 hours observation.  Mildly drowsy at end of session. BC of gait px he needed to use WC to leave the office. No med concerns.  Seen with W.    Dep is still improved.  But is dealing with poor gait, weak and slow.  Will start neurorehab today.   Anxiety re: OCD manageable. Thought and affect quicker and more responsive .  Humor returned.   ECT-MADRS    Flowsheet Row Office Visit from 06/18/2023 in [facility] Office Visit from 04/16/2023 in [facility]  MADRS Total Score 37 36   PHQ2-9    Flowsheet Row Office Visit from 03/15/2020 in [facility] Total Score 3  PHQ-9 Total  Score 9     B schizophrenic SUI. After M's death. PCP Cecil Ee at [facility] at 73 years old.  Prior psychiatric medication trials include  Lexapro  20, citalopram NR, clomipramine weight gain, paroxetine, fluoxetine, Luvox, Trintellix,   Increase Lexapro  back to 20 mg January 2020. & 10/2020 bupropion ,  Auvelity NR  Abilify  10 fatigue,  Cerefolin NAC, and   Naltrexone  sexual SE Lyrica  150 tired  pramipexole,  ropinirole  Adderall XR & IR sexual SE,  Ritalin  30, Concerta  72 mg AM NR modafinil  and Nuvigil,   History [employer] OCD  Review of Systems:  Review of Systems  Constitutional:  Positive for fatigue. Negative for fever.  Cardiovascular:  Negative for chest pain and palpitations.  Genitourinary:        ED  Musculoskeletal:  Positive for arthralgias, gait problem and myalgias.  Neurological:  Negative  for tremors.  Psychiatric/Behavioral:  Positive for dysphoric mood and sleep disturbance. Negative for agitation, behavioral problems, confusion, decreased concentration, hallucinations, self-injury and suicidal ideas. The patient is nervous/anxious. The patient is not hyperactive.     Medications: I have reviewed the patient's current medications.  Current Outpatient Medications  Medication Sig Dispense Refill   ALPRAZolam  (XANAX ) 0.25 MG tablet TAKE 1 TABLET (0.25 MG TOTAL) BY MOUTH 2 (TWO) TIMES DAILY AS NEEDED FOR ANXIETY OR SLEEP. 30 tablet 1   aspirin 81 MG tablet Take 81 mg by mouth daily.     Cholecalciferol (VITAMIN D-3) 5000 units TABS Take 5,000 Units by mouth daily.      DULoxetine  (CYMBALTA ) 30 MG capsule Take 3 capsules (90 mg total) by mouth daily. 270 capsule 0   naltrexone  (DEPADE) 50 MG tablet Take 0.5 tablets (25 mg total) by mouth daily. 45 tablet 1   pregabalin  (LYRICA ) 50 MG capsule Take 1 capsule (50 mg total) by mouth at bedtime. 90 capsule 0   simvastatin (ZOCOR) 20 MG tablet Take 20 mg by mouth at bedtime.      ZEPBOUND  10 MG/0.5ML Pen Inject 10 mg into the skin once a week.     methylphenidate  36 MG PO CR tablet Take 1 tablet (36 mg total) by mouth daily. 30 tablet 0   Semaglutide ,0.25 or 0.5MG /DOS, (OZEMPIC , 0.25 OR 0.5 MG/DOSE,) 2 MG/1.5ML SOPN Inject 2.4 mg into the skin once a week.     No current facility-administered medications for this visit.    Medication Side Effects: None sexual SE are better not  All gone.  Allergies:  Allergies  Allergen Reactions   E.E.S. [Erythromycin] Hives   Macrolides And Ketolides Other (See Comments)    EES    Rosuvastatin     Other reaction(s): cramps    Past Medical History:  Diagnosis Date   Allergy    Anemia    iron- pt denies    Anxiety    Aortic cusp regurgitation    Carotid artery occlusion    Constipation    Coronary artery stenosis    Hyperlipidemia    Lactose intolerance    Major depression, recurrent, chronic (HCC)    Obesity    OCD (obsessive compulsive disorder)    OSA (obstructive sleep apnea)    Other chronic pain    Periodic limb movement disorder    Periodic limb movements of sleep    Prediabetes    Pure hypercholesterolemia    Restless legs    Sleep apnea    wears cpap    Vitamin D deficiency     Family History  Problem Relation Age of Onset   Cancer Mother        breast and ovarian   Anxiety disorder Mother    Breast cancer Mother    Ovarian cancer Mother    Depression Father        bi-polar   Hyperlipidemia Father    Heart disease Father    Sudden death Father    Bipolar disorder Father    Sleep apnea Father    Obesity Father    Depression Son    Colon cancer Neg Hx    Colon polyps Neg Hx    Esophageal cancer Neg Hx    Rectal cancer Neg Hx    Stomach cancer Neg Hx     Social History   Socioeconomic History   Marital status: Married    Spouse name: Not on file   Number of children:  Not on file   Years of education: Not on file   Highest education level: Not on file  Occupational History    Occupation: retired Pensions consultant  Tobacco Use   Smoking status: Never   Smokeless tobacco: Never  Substance and Sexual Activity   Alcohol use: Yes    Comment: occasionally    Drug use: No   Sexual activity: Not on file  Other Topics Concern   Not on file  Social History Narrative   Not on file   Social Drivers of Health   Financial Resource Strain: Not on file  Food Insecurity: No Food Insecurity (01/25/2022)   Received from [facility] visits prior to 03/24/2022[facility]   Hunger Vital Sign    Worried About Running Out of Food in the Last Year: Never true    Ran Out of Food in the Last Year: Never true  Transportation Needs: No Transportation Needs (01/25/2022)   Received from [employer], [facility] visits prior to 03/24/2022.   PRAPARE - Administrator, Civil Service (Medical): No    Lack of Transportation (Non-Medical): No  Physical Activity: Not on file  Stress: Not on file  Social Connections: Not on file  Intimate Partner Violence: Not on file    Past Medical History, Surgical history, Social history, and Family history were reviewed and updated as appropriate.   Please see review of systems for further details on the patient's review from today.   Objective:   Physical Exam:  There were no vitals taken for this visit.  Physical Exam Constitutional:      General: He is not in acute distress.    Appearance: He is obese.  Musculoskeletal:        General: No deformity.  Neurological:     Mental Status: He is alert and oriented to person, place, and time.     Cranial Nerves: No dysarthria.     Comments: Shortened gait.  No sig tremor.  Slow.  Psychiatric:        Attention and Perception: Attention and perception normal. He does not perceive auditory or visual hallucinations.        Mood and Affect: Mood is anxious and depressed. Affect is not labile, blunt, angry or inappropriate.        Speech: Speech  normal.        Behavior: Behavior is slowed. Behavior is cooperative.        Thought Content: Thought content normal. Thought content is not paranoid or delusional. Thought content does not include homicidal or suicidal ideation. Thought content does not include suicidal plan.        Cognition and Memory: Cognition and memory normal.        Judgment: Judgment normal.     Comments: Insight intact Depression 60% better OCD is about the same and not the main problem More expressive and normal affect.    November 06, 2018: [place] Cog test in office within normal limits MMSE 28/30. Animal fluency 17 . (borderline) Taken as a whole, no indication to pursue neuropsychological testing.  Mini-Mental status exam 28/30 on 10/27/20.  No evidence of dementia.  Lab Review:   Vitamin D level acceptable at 54.5.   Normal B12 and folate and TSH in last couple of years..  Echocardiogram is stable re: AVR over the last 8 years and not likely the cause of lethargy.   06/28/23 MRI head:  IMPRESSION: 1. No acute intracranial  abnormality. 2. 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 Head CT may be helpful for further assessment of the degree of mineralization. 3. Findings of chronic small vessel ischemia.  SABRAres Assessment: Plan:    Verdon was seen today for follow-up, depression, anxiety, add and fatigue.  Diagnoses and all orders for this visit:  Recurrent major depression resistant to treatment [facility])  Mixed obsessional thoughts and acts  Attention deficit hyperactivity disorder (ADHD), predominantly inattentive type -     methylphenidate  36 MG PO CR tablet; Take 1 tablet (36 mg total) by mouth daily.  Hypersomnia with sleep apnea  Low vitamin D level  Obstructive sleep apnea  Mild cognitive impairment  Major depressive disorder, recurrent episode, moderate (HCC) -      methylphenidate  36 MG PO CR tablet; Take 1 tablet (36 mg total) by mouth daily.      Mr. Mclester has a long history of depression and OCD.  Major depression with fatigue, cognitive and physical slowness, anhedonia is much worse.  Started Spravato  and improving.  Was better energy with duloxetine  90 vs Lexapro  so increased to 120 mg daily DT worsening depression.    But it was not better. So reduced it back to 90 mg daily. Dep is much worse, in fact, his worst depression ever. pursue Spravato  He has some compulsive checking and obsessions around the house maintenance.  When travels then tends to have less OCD bc triggered less.    Patient was administered Spravato 84 mg intranasally today.  The patient experienced the typical dissociation which gradually resolved over the 2-hour period of observation.  There were no complications.  Specifically the patient did not have nausea or vomiting or headache.  Blood pressures remained within normal ranges at the 40-minute and 2-hour follow-up intervals.  By the time the 2-hour observation period was met the patient was alert and oriented and able to exit without assistance.  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  See nursing note for further details. Emphasized need to relax with Spravato and not return calls, read, return chats, emails etc. Started Spravato 06/25/23  His OCD is persistent with checking lites, stove, etc. .  It is better at the moment DT depression being worse.  Disc CBT techniques and potential for more therapy to address. Option Dr. Marijean.  Continue recent retrial Concerta  36 mg AM He wants to use it for depression and cognitive concerns.  Discussed potential benefits, risks, and side effects of stimulants with patient to include increased heart rate, palpitations, insomnia, increased anxiety, increased irritability, or decreased  appetite.  Instructed patient to contact office if experiencing any significant tolerability issues.  Extensive discussion of sleep study and he has a copy.  Why is there virtually no N3 & REM sleep?  Is that affecting daytime alertness and fatigue?  Vs how much is related to mild OSA and PLMS?  Disc this in detail.  Wrote coreespondence with sleep doc about it.  Options for sleep & fatigue:  Difficult to assess  bc has been out of country and then sick for 3 weeks.   Focus on reduction in OSA Focus on improving deep stage sleep.  ? Rx low dose mirtazapine or alternatives Focus on leg movements (hx RLS/PLMS) continue Trial as had been suggested Lyrica  for FM type sx and may help RLS/PLMS.  Better dreaming  on 100 mg HS and too sleepy on 150.  Decreased  to 100 mg HS for sleep and back pain and RLS No ropinirole  needed.   Disc SE.  Not having any.   Use LED Xanax  and try to avoid BZ daytime bc fatigue.  He doesn't use it much daythime.  Using 0..25-0.5 mg at night.  May need less with more Lyrica  at [facility] and try to minimize.  No hangover. We discussed the short-term risks associated with benzodiazepines including sedation and increased fall risk among others.  Discussed long-term side effect risk including dependence, potential withdrawal symptoms, and the potential eventual dose-related risk of dementia.  But recent studies from 2020 dispute this association between benzodiazepines and dementia risk. Newer studies in 2020 do not support an association with dementia.  Stimulant partially successfully used off label to augment antidepressants for depression and have resulted in improved productivity and attention.    Previous screening of memory was not suggestive of any neuro degenerative process. Mini-Mental status exam 28/30 on 10/27/20.   Recent cognition worse as dep worsened.  Also gait is short and slow.  Pending neuro appt.  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  Plan:  no med changes.   Meds:  continue duloxetine  to 120 mg daily.  Off Lexapro .  Lyrica  100 mg HS.  resumed MPH ER 36 mg AM   Spravato disc in detail.  He wants to continue.  He and wife notice he's better.  Administer twice weekly until plateau with max benefit.  He's still improving.  Will administer at 84 mg .   Follow-up   Lorene Macintosh MD, DFAPA.  Please see After Visit Summary for patient specific instructions.  Future Appointments  Date Time Provider Department Center  07/25/2023  9:00 AM Cottle, Lorene KANDICE Raddle., MD CP-CP None  07/25/2023  9:00 AM CP-NURSE CP-CP None  07/30/2023  9:00 AM Cottle, Lorene KANDICE Raddle., MD CP-CP None  07/30/2023  9:00 AM CP-NURSE CP-CP None  07/31/2023  2:45 PM Annett Sheffield SAILOR, PT [facility]-NR [facility]  08/01/2023  9:00 AM CP-NURSE CP-CP None  08/01/2023  9:00 AM White, Redell LABOR, NP CP-CP None  08/02/2023 12:30 PM Annett Sheffield SAILOR, PT [facility]  08/07/2023 12:30 PM Annett Sheffield SAILOR, PT [facility]  08/09/2023 12:30 PM Annett Sheffield SAILOR, PT [facility]  08/13/2023  1:30 PM Cottle, Lorene KANDICE Raddle., MD CP-CP None  08/14/2023 11:00 AM Annett Sheffield SAILOR, PT [facility]  08/16/2023 12:30 PM Annett Sheffield SAILOR, PT [facility]  08/19/2023 11:00 AM Annett Sheffield SAILOR, PT [facility]  08/21/2023  8:00 AM Annett Sheffield SAILOR, PT [facility]  08/28/2023 12:30 PM Plaster, Marlon BRAVO, PT [facility]  08/30/2023 12:30 PM Plaster, Marlon BRAVO, PT [facility]  09/04/2023 12:30 PM Annett Sheffield SAILOR, PT [facility]  09/06/2023 12:30 PM Annett Sheffield SAILOR, PT [facility]  09/17/2023  1:00 PM Cottle, Lorene KANDICE Raddle., MD CP-CP None  10/15/2023  1:30 PM Cottle, Lorene KANDICE Raddle., MD CP-CP None  01/30/2024  3:00 PM Tat, Asberry RAMAN, DO [facility] None     No orders of the defined types were placed in this encounter.      -------------------------------

## 2023-07-25 ENCOUNTER — Ambulatory Visit (INDEPENDENT_AMBULATORY_CARE_PROVIDER_SITE_OTHER): Admitting: Psychiatry

## 2023-07-25 ENCOUNTER — Ambulatory Visit

## 2023-07-25 VITALS — BP 132/80 | HR 71

## 2023-07-25 DIAGNOSIS — E7800 Pure hypercholesterolemia, unspecified: Secondary | ICD-10-CM | POA: Diagnosis not present

## 2023-07-25 DIAGNOSIS — G4710 Hypersomnia, unspecified: Secondary | ICD-10-CM

## 2023-07-25 DIAGNOSIS — Z6835 Body mass index (BMI) 35.0-35.9, adult: Secondary | ICD-10-CM | POA: Diagnosis not present

## 2023-07-25 DIAGNOSIS — E66812 Obesity, class 2: Secondary | ICD-10-CM | POA: Diagnosis not present

## 2023-07-25 DIAGNOSIS — G4733 Obstructive sleep apnea (adult) (pediatric): Secondary | ICD-10-CM

## 2023-07-25 DIAGNOSIS — F339 Major depressive disorder, recurrent, unspecified: Secondary | ICD-10-CM

## 2023-07-25 DIAGNOSIS — E559 Vitamin D deficiency, unspecified: Secondary | ICD-10-CM | POA: Diagnosis not present

## 2023-07-25 DIAGNOSIS — G3184 Mild cognitive impairment, so stated: Secondary | ICD-10-CM

## 2023-07-25 DIAGNOSIS — G2581 Restless legs syndrome: Secondary | ICD-10-CM

## 2023-07-25 DIAGNOSIS — F422 Mixed obsessional thoughts and acts: Secondary | ICD-10-CM

## 2023-07-25 DIAGNOSIS — F909 Attention-deficit hyperactivity disorder, unspecified type: Secondary | ICD-10-CM | POA: Diagnosis not present

## 2023-07-25 DIAGNOSIS — R7989 Other specified abnormal findings of blood chemistry: Secondary | ICD-10-CM

## 2023-07-25 DIAGNOSIS — F900 Attention-deficit hyperactivity disorder, predominantly inattentive type: Secondary | ICD-10-CM

## 2023-07-25 NOTE — Progress Notes (Signed)
 NURSES NOTE:         Pt arrived for his 9 th Spravato Treatment for treatment resistant depression, the starting dose was 56 mg (2 of the 28 mg nasal sprays) and he tolerated it well with no side effects or complaints. Pt reports no issues after his last treatment so he will continue 84 mg which is maintenance dose and he will continue as long as tolerating it well. Eric [name] is a patient of Dr. Calhoun so he will follow his care throughout treatments and follow ups. Pt's Spravato is a medical authorization through buy and bill.  Spravato medication is stored at treatment center per REMS/FDA guidelines. The medication is required to be locked behind two doors per REMS/FDA protocol. Medication is also disposed of properly after each use per regulations. All documentation for REMS is completed and submitted per FDA/REMS requirements.          Began taking patient's vital signs at 9:00 AM 141/74, pulse 72, SpO2 93%. Instructed patient to blow his nose if needed then recline back to a 45 degree angle. Gave patient first dose 28 mg nasal spray, administered in each nostril as directed and observed by nurse, waited 5 more minutes for the second and third doses. After all doses given pt did not complain of any nausea/vomiting. Assessed his 40 minute vitals, 9:42 AM, 138/77, pulse 72, SpO2 93%. Pt reports doing fine, no complaints voiced  Explained he would be monitored for a total time of 120 minutes. Discharge vitals were taken at 10:50 AM 132/80, P 71, SpO2 93%. Dr. Geoffry came to visit with patient once his thoughts were clearer to discuss how treatment went. Recommend he go home and sleep or just relax on the couch. No driving, no intense activities. Verbalized understanding. Pt. will be receiving 2 treatments per week for 4 weeks as recommended. Nurse was with pt a total of 60 minutes for clinical assessment. Pt is always taken down via wheelchair due to pt's unsteady gait prior to treatments. Pt is scheduled  Tuesdays and Thursdays. Instructed to call with any issues.     LOT [id number] EXP JAN [id number]

## 2023-07-29 ENCOUNTER — Ambulatory Visit: Admitting: Physical Therapy

## 2023-07-29 ENCOUNTER — Ambulatory Visit: Payer: Self-pay | Admitting: Neurology

## 2023-07-29 NOTE — Progress Notes (Signed)
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 often naps.   Sometimes poor sleep. Plan  no changes  01/21/23 appt noted: Worrying too much bc OCD.  Trying to decide about how to deal with a trigger lately.  OCD driving me crazy wanting to make things perfect.   Other than the trigger had a nice time since here.  GS here for 5 days at 73 yo.  Enjoyed that.   Taking semaglutide   down to 250# from 283#. Card at [facility] says he does not have Aortic aneurysm.  Measuring is OK.  Will FU with MRI in June.   Some diarrhea lately. Cannot stop worrying.  Triggered by the plumbing issue. Meds as above.  No SE of sig.   Plan:  switched to duloxetine  to 90 mg daily.  Off Lexapro .  Others as noted. Lyrica  100 mg HS. Has held Ritalin  10 TID, can resume as long as SBP below 140 per card.  none needed ropinirole  3 mg pm.  No risperidone .  02/18/23 appt noted:  with Eric [name] Meds: as above. Taking Ritalin  30% of night.  Prn Xanax  rarely if gets off sleep cycle. No SE.   Terribly dep but not as bad as in the past. Taking Ritalin  appears to help significantly and started doing that.  Tend to stay in bed till noon but pattern of up late.   OCD about the same with some avoidance of detail work.   Afraid I'll throw away something I need.   She doesn't know whether Ritalin  helps No sig RLS and wife agrees. Thinks of deceased B at the holidays but worries over son Deward too.   Plan: Increase duloxetine  to 120 mg daily.  Off Lexapro .  Others as noted. Lyrica  100 mg HS.  resumed Ritalin  30 AM with some benefit. no risperidone .  03/19/23 appt noted: Psych med:  duloxetine  120, Ritalin  20 am  missing doses, Lyrica  100 HS, no risperidone , no ropinirole .   No improvement in depression since increase dose duloxetine .  More dep than usual.  Worrying about everything.  Including taxes.  All I can see is a black hole.  Making him feel negative about everything.   Keeping him from doing things.   OCD is not much different from when on Lexapro .  Wife agrees dep and distracted. Plan: resumed Ritalin  30 AM with some benefit. Resume Abilify  2 mg AM for dep.  04/16/23 appt noted:  wife here Psych med:  duloxetine  120, resumed Concerta  36 mg AM, Lyrica  100 HS, Abilify  2, no ropinirole .   Wife noted he seemed manic yesterday.  Invited window estimate against wife's will and without her input.   No mania noted until yesterday. He didn't feel manic yesterday.   He's noticed no change with Abilify  and still feels flat and a little low.  From MPH energy and mood a little better.  Sleeps until 10-11 am about 12 hours. And asleep that whole time. Wife says he doesn't eat much bc he's not awake much. Trouble with hygiene.   Plan: Meds: continue duloxetine  to 120 mg daily.  Off Lexapro .  Lyrica  100 mg HS.  resumed Ritalin  30 AM with some benefit. DC Abiilify Vraylar  1.5 mg every other day Spravato disc in detail.  He wants to pursue if not better with Vraylar .  06/18/23 appt noted: with W Med:  duloxetine  120, resumed Concerta  36 mg AM, Lyrica  100 HS, no ropinirole .   Vraylar  1.5 every other day, no benefit Spravato denied by insurance but is being appealed. More trouble walking and shorter gait.  Neuro  in [place] appt in Sept.   Looking to get in in [place].  Can't be much more dep than I am.   OCD residual when has to make a decision.   ED is more of a px. Reduced voice family. Plan : Spravato  06/25/23 appt noted: with W Med:  duloxetine  120, Concerta  36 mg AM, Lyrica  100 HS, no ropinirole .   Vraylar  1.5 daily No SE Received Spravato 56 mg today.  Experienced very mild dissociation and no sig HA, N.  But some dizziness.  Resolved by end of 2 hours observation.  Able to leave office without assistance.  Ongoing dep without change.  Slow, reduced cognition, anhedonia, forgetful, low motivation. No other concerns with meds.   06/27/23 appt noted:  Med: Med:  duloxetine  120, Concerta  36 mg AM, Lyrica  100 HS, no ropinirole .   Stopped Vraylar  1.5 daily No SE Received Spravato 84 mg todayfor the first time..  Experienced very mild dissociation and no sig HA, N.  But some dizziness.  Resolved by end of 2 hours observation.  Able to leave office without assistance.  Ongoing dep without change.  Slow, reduced cognition, anhedonia, forgetful, low motivation. No other concerns with meds.   07/02/23 appt oted: Med: Med:  duloxetine  90, Concerta  36 mg AM, Lyrica  100 HS, no ropinirole .   Stopped Vraylar  1.5 daily No SE Received Spravato 84 mg today.  Experienced very mild dissociation and no sig HA, N.  But some dizziness.  Resolved by end of 2 hours observation.  Mildly drowsy at end of session. BC of gait px he needed to use WC to leave the office.  Per wife gait gradually worsened over a couple of years but accelerated recently in 3 mos or so.  Short gait.  Not due to pain.  Pending neuro appt.  MRI last week not read yet. No problems with meds. Wife noticed he's shown more initiative at home since [place] ex doing crossword puzzles.  More engaged.  He feels lighter already with it.  07/15/23 appt noted:  Med: Med:  duloxetine  90, Concerta  36 mg AM, Lyrica  100 HS, no ropinirole .  No SE Received Spravato  84 mg today.  Experienced very mild dissociation and no sig HA, N.  But some dizziness.  Resolved by end of 2 hours observation.  Mildly drowsy at end of session. BC of gait px he needed to use WC to leave the office. Seen with W.   W noted clear benefit Spravato with stronger voice, spontaneous chores he wasn't doing.  More engaged in coverstation.  Pt agrees. Disc concerns over gait px and his neuro visit and MRI scan suggesting Fahr's DZ.  He'll discuss further with DR. Tat tomorrow.  07/18/23 appt noted:  Med: Med:  duloxetine  90, Concerta  36 mg AM, Lyrica  100 HS, no ropinirole .  No SE Received Spravato 84 mg today.  Experienced very mild dissociation and no sig HA, N.  But some dizziness.  Resolved by end of 2 hours observation.  Mildly drowsy at end of session. BC of gait px he needed to use WC to leave the office. No med concerns.  Seen with W.    Both agree dep is better .  More affective range.  More socially engaged.  Quicker thought.  More active .  Less down and less negative.  07/23/23 appt noted: Med: Med:  duloxetine  90, Concerta  36 mg AM, Lyrica  100 HS, no ropinirole .  No SE Received Spravato 84 mg today.  Experienced very mild dissociation and no sig HA, N.  But some dizziness.  Resolved by end of 2 hours observation.  Mildly drowsy at end of session. BC of gait px he needed to use WC to leave the office. No med concerns.  Seen with W.    Dep is still improved.  But is dealing with poor gait, weak and slow.  Will start neurorehab today.   Anxiety re: OCD manageable. Thought and affect quicker and more responsive .  Humor returned.  07/25/23 appt:  Med: Med:  duloxetine  90, Concerta  36 mg AM, Lyrica  100 HS, no ropinirole .  No SE Received Spravato 84 mg today.  Experienced very mild dissociation and no sig HA, N.  But some dizziness.  Resolved by end of 2 hours observation.  Mildly drowsy at end of session. BC of gait px he needed to use WC to leave the office. No med  concerns.  Seen with W.    Overall mood still improving but not 100%.  Starting neurorehab for weakness.  No new med concerns.  Satisfied with meds.  ECT-MADRS    Flowsheet Row Office Visit from 06/18/2023 in [facility] Office Visit from 04/16/2023 in [facility]  MADRS Total Score 37 36   PHQ2-9    Flowsheet Row Office Visit from 03/15/2020 in [facility] Total Score 3  PHQ-9 Total Score 9     B schizophrenic SUI. After M's death. PCP Cecil Ee at [facility] at 73 years old.  Prior psychiatric medication trials include  Lexapro  20, citalopram NR, clomipramine weight gain, paroxetine, fluoxetine, Luvox, Trintellix,   Increase Lexapro  back to 20 mg January 2020. &  10/2020 bupropion ,  Auvelity NR  Abilify  10 fatigue,  Cerefolin NAC, and   Naltrexone  sexual SE Lyrica  150 tired  pramipexole,  ropinirole  Adderall XR & IR sexual SE,  Ritalin  30, Concerta  72 mg AM NR modafinil  and Nuvigil,   History [employer] OCD  Review of Systems:  Review of Systems  Constitutional:  Positive for fatigue. Negative for fever.  Cardiovascular:  Negative for chest pain and palpitations.  Genitourinary:        ED  Musculoskeletal:  Positive for arthralgias, gait problem and myalgias.  Neurological:  Positive for weakness. Negative for tremors.  Psychiatric/Behavioral:  Positive for dysphoric mood and sleep disturbance. Negative for agitation, behavioral problems, confusion, decreased concentration, hallucinations, self-injury and suicidal ideas. The patient is nervous/anxious. The patient is not hyperactive.     Medications: I have reviewed the patient's current medications.  Current Outpatient Medications  Medication Sig Dispense Refill   ALPRAZolam  (XANAX ) 0.25 MG tablet TAKE 1 TABLET (0.25 MG TOTAL) BY MOUTH 2 (TWO) TIMES DAILY AS NEEDED FOR ANXIETY OR  SLEEP. 30 tablet 1   aspirin 81 MG tablet Take 81 mg by mouth daily.     Cholecalciferol (VITAMIN D-3) 5000 units TABS Take 5,000 Units by mouth daily.      DULoxetine  (CYMBALTA ) 30 MG capsule Take 3 capsules (90 mg total) by mouth daily. 270 capsule 0   methylphenidate  36 MG PO CR tablet Take 1 tablet (36 mg total) by mouth daily. 30 tablet 0   naltrexone  (DEPADE) 50 MG tablet Take 0.5 tablets (25 mg total) by mouth daily. 45 tablet 1   pregabalin  (LYRICA ) 50 MG capsule Take 1 capsule (50 mg total) by mouth at bedtime. 90 capsule 0   simvastatin (ZOCOR) 20 MG tablet Take 20 mg by mouth at bedtime.     ZEPBOUND  10 MG/0.5ML Pen Inject 10 mg into the skin once a week.     Semaglutide ,0.25 or 0.5MG /DOS, (OZEMPIC , 0.25 OR 0.5 MG/DOSE,) 2 MG/1.5ML SOPN Inject 2.4 mg into the skin once a week.     No current facility-administered medications for this visit.    Medication Side Effects: None sexual SE are better not  All gone.  Allergies:  Allergies  Allergen Reactions   E.E.S. [Erythromycin] Hives   Macrolides And Ketolides Other (See Comments)    EES    Rosuvastatin     Other reaction(s): cramps    Past Medical History:  Diagnosis Date   Allergy    Anemia    iron- pt denies    Anxiety    Aortic cusp regurgitation    Carotid artery occlusion    Constipation    Coronary artery stenosis    Hyperlipidemia    Lactose intolerance    Major depression, recurrent, chronic (HCC)    Obesity    OCD (obsessive compulsive disorder)    OSA (obstructive sleep apnea)    Other chronic pain    Periodic limb movement disorder    Periodic limb movements of sleep    Prediabetes    Pure hypercholesterolemia    Restless legs    Sleep apnea    wears cpap    Vitamin D deficiency     Family History  Problem Relation Age of Onset   Cancer Mother        breast and ovarian   Anxiety disorder Mother    Breast cancer Mother    Ovarian cancer Mother    Depression Father        bi-polar  Hyperlipidemia Father    Heart disease Father    Sudden death Father    Bipolar disorder Father    Sleep apnea Father    Obesity Father    Depression Son    Colon cancer Neg Hx    Colon polyps Neg Hx    Esophageal cancer Neg Hx    Rectal cancer Neg Hx    Stomach cancer Neg Hx     Social History   Socioeconomic History   Marital status: Married    Spouse name: Not on file   Number of children: Not on file   Years of education: Not on file   Highest education level: Not on file  Occupational History   Occupation: retired Pensions consultant  Tobacco Use   Smoking status: Never   Smokeless tobacco: Never  Substance and Sexual Activity   Alcohol use: Yes    Comment: occasionally    Drug use: No   Sexual activity: Not on file  Other Topics Concern   Not on file  Social History Narrative   Not on file   Social Drivers of Health   Financial Resource Strain: Not on file  Food Insecurity: No Food Insecurity (01/25/2022)   Received from [facility] prior to 03/24/2022., [facility]   Hunger Vital Sign    Worried About Running Out of Food in the Last Year: Never true    Ran Out of Food in the Last Year: Never true  Transportation Needs: No Transportation Needs (01/25/2022)   Received from [employer], [facility] visits prior to 03/24/2022.   PRAPARE - Administrator, Civil Service (Medical): No    Lack of Transportation (Non-Medical): No  Physical Activity: Not on file  Stress: Not on file  Social Connections: Not on file  Intimate Partner Violence: Not on file    Past Medical History, Surgical history, Social history, and Family history were reviewed and updated as appropriate.   Please see review of systems for further details on the patient's review from today.   Objective:   Physical Exam:  There were no vitals taken for this visit.  Physical Exam Constitutional:      General: He is not in acute distress.     Appearance: He is obese.  Musculoskeletal:        General: No deformity.  Neurological:     Mental Status: He is alert and oriented to person, place, and time.     Cranial Nerves: No dysarthria.     Coordination: Coordination abnormal.     Comments: Shortened gait.  No sig tremor.  Slow.  Psychiatric:        Attention and Perception: Attention and perception normal. He does not perceive auditory or visual hallucinations.        Mood and Affect: Mood is anxious and depressed. Affect is not labile, blunt, angry or inappropriate.        Speech: Speech normal.        Behavior: Behavior is slowed. Behavior is cooperative.        Thought Content: Thought content normal. Thought content is not paranoid or delusional. Thought content does not include homicidal or suicidal ideation. Thought content does not include suicidal plan.        Cognition and Memory: Cognition and memory normal.        Judgment: Judgment normal.     Comments: Insight intact Depression 60% better OCD is about the same and  not the main problem More expressive and normal affect.    November 06, 2018: [place] Cog test in office within normal limits MMSE 28/30. Animal fluency 17 . (borderline) Taken as a whole, no indication to pursue neuropsychological testing.  Mini-Mental status exam 28/30 on 10/27/20.  No evidence of dementia.  Lab Review:   Vitamin D level acceptable at 54.5.   Normal B12 and folate and TSH in last couple of years..  Echocardiogram is stable re: AVR over the last 8 years and not likely the cause of lethargy.   06/28/23 MRI head:  IMPRESSION: 1. No acute intracranial abnormality. 2. 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 Head CT may be helpful for further assessment of the degree of mineralization. 3. Findings of chronic small vessel ischemia.  SABRAres Assessment: Plan:     Elian was seen today for follow-up, depression, anxiety, fatigue and add.  Diagnoses and all orders for this visit:  Recurrent major depression resistant to treatment [facility])  Mixed obsessional thoughts and acts  Attention deficit hyperactivity disorder (ADHD), predominantly inattentive type  Hypersomnia with sleep apnea  Low vitamin D level  Obstructive sleep apnea  Mild cognitive impairment  Restless legs syndrome   Mr. Heroux has a long history of depression and OCD.  Major depression with fatigue, cognitive and physical slowness, anhedonia is much worse.  Started Spravato  and improving.  Was better energy with duloxetine  90 vs Lexapro  so increased to 120 mg daily DT worsening depression.    But it was not better. So reduced it back to 90 mg daily. Dep is much worse, in fact, his worst depression ever. pursue Spravato  He has some compulsive checking and obsessions around the house maintenance.  When travels then tends to have less OCD bc triggered less.    Patient was administered Spravato 84 mg intranasally today.  The patient experienced the typical dissociation which gradually resolved over the 2-hour period of observation.  There were no complications.  Specifically the patient did not have nausea or vomiting or headache.  Blood pressures remained within normal ranges at the 40-minute and 2-hour follow-up intervals.  By the time the 2-hour observation period was met the patient was alert and oriented and able to exit without assistance.  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  See nursing note for further details. Emphasized need to relax with Spravato and not return calls, read, return chats, emails etc. Started Spravato 06/25/23  His OCD is persistent with checking lites, stove, etc. .  It is better at the moment DT depression being worse.  Disc CBT techniques and potential  for more therapy to address. Option Dr. Marijean.  Continue recent retrial Concerta  36 mg AM He wants to use it for depression and cognitive concerns.  Discussed potential benefits, risks, and side effects of stimulants with patient to include increased heart rate, palpitations, insomnia, increased anxiety, increased irritability, or decreased appetite.  Instructed patient to contact office if experiencing any significant tolerability issues.  Extensive discussion of sleep study and he has a copy.  Why is there virtually no N3 & REM sleep?  Is that affecting daytime alertness and fatigue?  Vs how much is related to mild OSA and PLMS?  Disc this in detail.  Wrote coreespondence with sleep doc about it.  Options for sleep & fatigue:  Difficult to assess  bc has been out of country and then sick for 3 weeks.   Focus on reduction in OSA Focus on improving deep stage sleep.  ? Rx low dose mirtazapine or alternatives Focus on leg movements (hx RLS/PLMS) continue Trial as had been suggested Lyrica  for FM type sx and may help RLS/PLMS.  Better dreaming on 100 mg HS and too sleepy on 150.  Decreased  to 100 mg HS for sleep and back pain and RLS No ropinirole  needed.   Disc SE.  Not having any.   Use LED Xanax  and try to avoid BZ daytime bc fatigue.  He doesn't use it much daythime.  Using 0..25-0.5 mg at night.  May need less with more Lyrica  at [facility] and try to minimize.  No hangover. We discussed the short-term risks associated with benzodiazepines including sedation and increased fall risk among others.  Discussed long-term side effect risk including dependence, potential withdrawal symptoms, and the potential eventual dose-related risk of dementia.  But recent studies from 2020 dispute this association between benzodiazepines and dementia risk. Newer studies in 2020 do not support an association with dementia.  Stimulant partially successfully used off label to augment antidepressants for depression  and have resulted in improved productivity and attention.    Previous screening of memory was not suggestive of any neuro degenerative process. Mini-Mental status exam 28/30 on 10/27/20.   Recent cognition worse as dep worsened.  Also gait is short and slow.  Pending neuro appt.  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  Plan:  no med changes.   continue duloxetine  to 120 mg daily.  Off Lexapro .  Lyrica  100 mg HS.  resumed MPH ER 36 mg AM   Spravato disc in detail.  He wants to continue.  He and wife notice he's better.  Administer twice weekly until plateau with max benefit.  He's still improving.  Will administer at 84 mg .   Follow-up   Lorene Macintosh MD, DFAPA.  Please see After Visit Summary for patient specific instructions.  Future Appointments  Date Time Provider Department Center  07/30/2023  9:00 AM Cottle, Lorene KANDICE Raddle., MD CP-CP None  07/30/2023  9:00 AM CP-NURSE CP-CP None  07/31/2023  2:45 PM Annett Sheffield SAILOR, PT [facility]-NR [facility]  08/01/2023  9:00 AM CP-NURSE CP-CP None  08/01/2023  9:00 AM Teresa Redell LABOR, NP CP-CP None  08/02/2023 12:30 PM Annett Sheffield SAILOR, PT [facility]  08/07/2023 12:30 PM Annett Sheffield SAILOR, PT [facility]  08/09/2023 12:30 PM Annett Sheffield SAILOR, PT [facility]  08/13/2023  1:30 PM Cottle, Lorene KANDICE Raddle., MD CP-CP None  08/14/2023 11:00 AM Annett Sheffield SAILOR, PT [facility]  08/16/2023 12:30 PM Annett Sheffield SAILOR, PT [facility]  08/19/2023 11:00 AM Annett Sheffield SAILOR, PT [facility]  08/21/2023  8:00 AM Annett Sheffield SAILOR, PT [facility]  08/28/2023 12:30 PM Plaster, Marlon BRAVO, PT [facility]  08/30/2023 12:30 PM Plaster, Marlon BRAVO, PT [facility]  09/04/2023 12:30 PM Annett Sheffield SAILOR, PT [facility]  09/06/2023 12:30 PM Annett Sheffield SAILOR, PT [facility]  09/17/2023  1:00 PM Cottle, Lorene KANDICE Raddle., MD CP-CP None  10/15/2023  1:30 PM Cottle, Lorene KANDICE Raddle., MD CP-CP None  01/30/2024  3:00 PM Tat, Asberry RAMAN, DO [facility] None     No orders  of the defined types were placed in this encounter.      -------------------------------

## 2023-07-30 ENCOUNTER — Ambulatory Visit

## 2023-07-30 ENCOUNTER — Ambulatory Visit (INDEPENDENT_AMBULATORY_CARE_PROVIDER_SITE_OTHER): Admitting: Psychiatry

## 2023-07-30 VITALS — BP 142/69 | HR 65

## 2023-07-30 DIAGNOSIS — F900 Attention-deficit hyperactivity disorder, predominantly inattentive type: Secondary | ICD-10-CM

## 2023-07-30 DIAGNOSIS — G3184 Mild cognitive impairment, so stated: Secondary | ICD-10-CM

## 2023-07-30 DIAGNOSIS — F422 Mixed obsessional thoughts and acts: Secondary | ICD-10-CM

## 2023-07-30 DIAGNOSIS — F339 Major depressive disorder, recurrent, unspecified: Secondary | ICD-10-CM

## 2023-07-30 DIAGNOSIS — G4710 Hypersomnia, unspecified: Secondary | ICD-10-CM

## 2023-07-30 DIAGNOSIS — R7989 Other specified abnormal findings of blood chemistry: Secondary | ICD-10-CM

## 2023-07-30 DIAGNOSIS — G2581 Restless legs syndrome: Secondary | ICD-10-CM

## 2023-07-30 DIAGNOSIS — G4733 Obstructive sleep apnea (adult) (pediatric): Secondary | ICD-10-CM

## 2023-07-30 NOTE — Progress Notes (Signed)
 NURSES NOTE:         Pt arrived for his 10 th Spravato Treatment for treatment resistant depression, the starting dose was 56 mg (2 of the 28 mg nasal sprays) and he tolerated it well with no side effects or complaints. Pt reports no issues after his last treatment so he will continue 84 mg which is maintenance dose and he will continue as long as tolerating it well. Wood is a patient of Dr. Calhoun so he will follow his care throughout treatments and follow ups. Pt's Spravato is a medical authorization through buy and bill.  Spravato medication is stored at treatment center per REMS/FDA guidelines. The medication is required to be locked behind two doors per REMS/FDA protocol. Medication is also disposed of properly after each use per regulations. All documentation for REMS is completed and submitted per FDA/REMS requirements.          Began taking patient's vital signs at 9:20 AM 133/88, pulse 65, SpO2 95%. Instructed patient to blow his nose if needed then recline back to a 45 degree angle. Gave patient first dose 28 mg nasal spray, administered in each nostril as directed and observed by nurse, waited 5 more minutes for the second and third doses. After all doses given pt did not complain of any nausea/vomiting. Assessed his 40 minute vitals, 10:00 AM, 129/68, pulse 63, SpO2 90%. Pt was very sedate then. Explained he would be monitored for a total time of 120 minutes. Discharge vitals were taken at 11:10 AM 142/69, P 65, SpO2 94%. Dr. Geoffry came to visit with patient once his thoughts were clearer to discuss how treatment went. Recommend he go home and sleep or just relax on the couch. No driving, no intense activities. Verbalized understanding. Pt. will be receiving 2 treatments per week for a bit longer. Nurse was with pt a total of 60 minutes for clinical assessment. Pt is always taken down via wheelchair due to pt's unsteady gait prior to treatments. Pt is scheduled Tuesdays and Thursdays.  Instructed to call with any issues.     LOT [id number] EXP JAN [id number]

## 2023-07-31 ENCOUNTER — Ambulatory Visit: Admitting: Physical Therapy

## 2023-07-31 DIAGNOSIS — R2689 Other abnormalities of gait and mobility: Secondary | ICD-10-CM | POA: Diagnosis not present

## 2023-07-31 DIAGNOSIS — G20C Parkinsonism, unspecified: Secondary | ICD-10-CM | POA: Diagnosis not present

## 2023-07-31 DIAGNOSIS — M6281 Muscle weakness (generalized): Secondary | ICD-10-CM

## 2023-07-31 DIAGNOSIS — R2681 Unsteadiness on feet: Secondary | ICD-10-CM

## 2023-07-31 DIAGNOSIS — R293 Abnormal posture: Secondary | ICD-10-CM | POA: Diagnosis not present

## 2023-07-31 NOTE — Therapy (Signed)
 OUTPATIENT PHYSICAL THERAPY NEURO TREATMENT   Patient Name: Eric [name] MRN: [id number] DOB:1950/10/28, 73 y.o., male Today's Date: 07/31/2023   PCP: Regino Slater, MD   REFERRING PROVIDER: Evonnie Asberry RAMAN, DO    END OF SESSION:  PT End of Session - 07/31/23 1453     Visit Number 2    Number of Visits 13    Date for PT Re-Evaluation 09/21/23    Authorization Type [employer]UE [employer]ICARE    PT Start Time 1449    PT Stop Time 1530    PT Time Calculation (min) 41 min    Equipment Utilized During Treatment Gait belt    Activity Tolerance Patient tolerated treatment well    Behavior During Therapy WFL for tasks assessed/performed          Past Medical History:  Diagnosis Date   Allergy    Anemia    iron- pt denies    Anxiety    Aortic cusp regurgitation    Carotid artery occlusion    Constipation    Coronary artery stenosis    Hyperlipidemia    Lactose intolerance    Major depression, recurrent, chronic (HCC)    Obesity    OCD (obsessive compulsive disorder)    OSA (obstructive sleep apnea)    Other chronic pain    Periodic limb movement disorder    Periodic limb movements of sleep    Prediabetes    Pure hypercholesterolemia    Restless legs    Sleep apnea    wears cpap    Vitamin D deficiency    Past Surgical History:  Procedure Laterality Date   COLONOSCOPY     DG BARIUM ENEMA ([facility] HX)     10-19-2010- turtous, elongated colon    HEMORRHOID SURGERY     TONSILLECTOMY  1960   Patient Active Problem List   Diagnosis Date Noted   Vitamin D deficiency 03/30/2020   Pure hypercholesterolemia 03/30/2020   Prediabetes 03/30/2020   Occlusion and stenosis of bilateral carotid arteries 03/30/2020   Obstructive sleep apnea syndrome 03/30/2020   Aortic cusp regurgitation 03/30/2020   Restless legs syndrome 03/30/2020   Moderate recurrent major depression (HCC) 03/30/2020   Chronic pain 03/30/2020   OCD (obsessive compulsive disorder)  10/15/2017   Family history of ovarian cancer 04/19/2011   Family history of breast cancer 04/19/2011    ONSET DATE: 07/12/2023   REFERRING DIAG: G20.C (ICD-10-CM) - Primary parkinsonism (HCC)   THERAPY DIAG:  Unsteadiness on feet  Other abnormalities of gait and mobility  Abnormal posture  Muscle weakness (generalized)  Rationale for Evaluation and Treatment: Rehabilitation  SUBJECTIVE:  SUBJECTIVE STATEMENT: No falls. Feels like his strides are better than last week. Had a couple of misses after his Spravato treatments as they make him more wobbly.    Pt accompanied by: Alfreda Shuck  PERTINENT HISTORY: PMH: Parkinsonism (Possibly due to exposure to antipsychotic medication), tardive dyskinesias, restless leg syndrome, CAD, pre-diabetes, obesity, OCD, major depression,HLD  He was not exposed to anything for very long, but wife reports that symptoms have really been developing over the last 5 to 6 weeks. He was on Abilify  for a very short time in February and March and then was on Vraylar  from March until June. The symptoms developed around that time. He is off of the Vraylar  now (just went off of it)   Per Dr. Evonnie regarding abnormal brain scan:  It does appear that there is heavy basal ganglia calcification.  We can certainly confirm this with CT.  While Fahrs disease is on the differential, the timeline for his symptoms really correlates more with the timeline for when he was placed on antipsychotic medication.   PAIN:  Are you having pain? No Has generalized body pain that is taken care of the Lyrica , reports R knee is the bad one  There were no vitals filed for this visit.    PRECAUTIONS: Fall  FALLS: Has patient fallen in last 6 months? No and almost falls grabbing the wall   LIVING  ENVIRONMENT: Lives with: lives with their spouse Lives in: House/apartment Stairs: Yes: Internal: 12 steps; on left going up and External: 4 and 5 steps; none Has following equipment at home: Single point cane and Grab bars  PLOF: Independent and Leisure: doesn't have any hobbies except cleaning up the house  PATIENT GOALS: Wants to get back to his regular gait and his regular walk so he can go on trips   OBJECTIVE:  Note: Objective measures were completed at Evaluation unless otherwise noted.  DIAGNOSTIC FINDINGS: MRI brain: IMPRESSION: 1. No acute intracranial abnormality. 2. 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 Head CT may be helpful for further assessment of the degree of mineralization. 3. Findings of chronic small vessel ischemia.  COGNITION: Overall cognitive status: Within functional limits for tasks assessed   SENSATION: WFL  COORDINATION: Heel to shin: some bradykinesia RLE RAM: dysmetric BUE     POSTURE: rounded shoulders, forward head, and posterior pelvic tilt   LOWER EXTREMITY MMT:    MMT Right Eval Left Eval  Hip flexion 3- 4  Hip extension    Hip abduction    Hip adduction    Hip internal rotation    Hip external rotation    Knee flexion 4 5  Knee extension 4 4  Ankle dorsiflexion 5 5  Ankle plantarflexion    Ankle inversion    Ankle eversion    (Blank rows = not tested)  BED MOBILITY:   Pt reports some difficulties getting under the sheets    TRANSFERS: Sit to stand: Modified independence  Assistive device utilized: None     Stand to sit: Modified independence  Assistive device utilized: None     Pt reports difficulties getting up from lower surfaces or in and out of the car.   GAIT: Findings: Gait Characteristics: decreased arm swing- Right, decreased arm swing- Left, decreased step length- Right, decreased step  length- Left, decreased stride length, decreased hip/knee flexion- Right, decreased hip/knee flexion- Left, decreased ankle dorsiflexion- Right, decreased ankle dorsiflexion- Left,  Right foot flat, Left foot flat, shuffling, decreased trunk rotation, trunk flexed, and narrow BOS, Distance walked: Clinic distances, Assistive device utilized:Single point cane and None, Level of assistance: CGA, and Comments: Pt with very shuffled steps. When pt gets single HHA from therapist for a couple steps, pt able to demo improved step length   FUNCTIONAL TESTS:  5 times sit to stand: 15.6 seconds with no UE support, incr forward flexed posture in standing  Timed up and go (TUG): 27.4 seconds with no AD, CGA with en bloc turning 10 meter walk test: 62 seconds with no AD and CGA = .52 ft/sec                                                                                                                                TREATMENT DATE: 07/31/23  Therapeutic Activity:  GAIT: Gait pattern: decreased stride length, decreased hip/knee flexion- Right, decreased hip/knee flexion- Left, decreased ankle dorsiflexion- Right, decreased ankle dorsiflexion- Left, Right foot flat, Left foot flat, shuffling, and trunk flexed Distance walked: 230' x 1  Assistive device utilized: Environmental consultant - 4 wheeled Level of assistance: SBA Comments: Trialed a rollator as pt ambulating in and out of session with SPC with significantly decr stride length and shuffled steps. Educated on Human resources officer with rollator. With rollator cued for staying close and incr stride length and heel strike. Pt able to incr his stride length compared to using SPC, and able to respond to some cues with heel strike, but still mainly has foot flat contact. Pt does report with rollator he feels more balanced that he is able to take longer strides. Discussed options to obtain a rollator - either through an order with insurance or can purchase on amazon. Pt reporting  he would purchase from [employer], so printed out handout of proper rollator to order to make sure it can be adjusted to pt's taller height.    NMR: Pt performs PWR! Moves in standing position, performed 2 sets of 10 reps, with chair in front of pt as needed for balance    PWR! Up for improved posture - cues for proper mini squat position (pt initially reporting it bothered his knees, so cues to shift weight more posteriorly, cues for looking up for more erect posture   PWR! Rock for improved weight shifting - cues to look up at hands   PWR! Twist for improved trunk rotation - cues to reset with tall posture in the middle before twisting to opposite side, pt's UE got fatigued, so brief rest break between each   PWR! Step for improved step initiation - cued for incr foot clearance and looking at hands, during 2nd set had pt stepping over a yardstick as an obstacle that improved his foot clearance, pt needing to use UE support   Cues provided for larger amplitude movements and technique. Pt reporting RPE as 7-8/10     PATIENT EDUCATION: Education details: Where  to purchase a rollator and purpose of using one for balance and incr stride length, initial HEP for standing PWR moves  Person educated: Patient and Spouse Education method: Explanation, Demonstration, Verbal cues, and Handouts Education comprehension: verbalized understanding, returned demonstration, verbal cues required, and needs further education  HOME EXERCISE PROGRAM: Standing PWR Moves   GOALS: Goals reviewed with patient? Yes  SHORT TERM GOALS: Target date: 08/13/2023   1. Pt will be independent with initial HEP with gait, balance, strength in order to build upon functional gains made in therapy. Baseline: Goal status: INITIAL  2.  Pt will improve TUG time to 22 seconds or less with no AD vs. LRAD in order to demo decrease fall risk. Baseline: 27.4 seconds with no AD, CGA Goal status: INITIAL  3.  Pt will improve gait  speed with LRAD to at least 1.3 ft/sec in order to demo decr fall risk/improved household mobility.  Baseline: 62 seconds with no AD = .52 ft/sec and CGA Goal status: INITIAL  4.  Pt will ambulate at least 230' with supervision with LRAD over level indoor surfaces in order to demo improved household mobility.  Baseline: pt ambulates with no AD with CGA  Goal status: INITIAL   LONG TERM GOALS: Target date: 09/03/2023   Pt will be independent with final HEP with gait, balance, strength in order to build upon functional gains made in therapy. Baseline:  Goal status: INITIAL  2.  Pt will improve TUG time to 17 seconds or less with no AD vs. LRAD in order to demo decrease fall risk. Baseline: 27.4 seconds with no AD, CGA Goal status: INITIAL  3.  Pt will improve 5x sit<>stand to less than or equal to 13 sec to demonstrate improved functional strength and transfer efficiency.  Baseline: 15.6 seconds with no UE support, incr forward flexed posture in standing Goal status: INITIAL  4.  Pt will improve gait speed with LRAD to at least 1.8 ft/sec in order to demo decr fall risk  Baseline: 62 seconds with no AD = .52 ft/sec and CGA Goal status: INITIAL  5.  Pt will ambulate at least 500' with supervision with LRAD over outdoor unlevel surfaces in order to demo improved community mobility.  Baseline:  Goal status: INITIAL    ASSESSMENT:  CLINICAL IMPRESSION: Pt arrives to session with SPC with continued decr stride length and foot clearance, with pt reporting his steps have gotten better since he was last here. Trialed rollator to work on longer strides and foot clearance. Pt able to incr his strides compared to use of SPC, but still with decr heel strike, despite verbal and demo cues from therapist. Showed pt where to purchase to use to improve gait efficiency and decr fall risk. Pt plans to obtain one. Remainder of session focused on intiaiting HEP for standing PWR moves for larger  amplitude movements. Pt most challenged by PWR Step with foot clearance. However, pt did better with use of stepping over a yardstick as a visual cue. Will continue per POC.   OBJECTIVE IMPAIRMENTS: Abnormal gait, decreased activity tolerance, decreased balance, decreased coordination, decreased knowledge of condition, decreased knowledge of use of DME, decreased mobility, difficulty walking, decreased strength, decreased safety awareness, impaired flexibility, and postural dysfunction.   ACTIVITY LIMITATIONS: transfers, bed mobility, and locomotion level  PARTICIPATION LIMITATIONS: shopping, community activity, and yard work  PERSONAL FACTORS: Age, Behavior pattern, Past/current experiences, Time since onset of injury/illness/exacerbation, and 3+ comorbidities: arkinsonism (Possibly due to exposure to antipsychotic  medication), tardive dyskinesias, restless leg syndrome, CAD, pre-diabetes, obesity, OCD, major depression,HLD are also affecting patient's functional outcome.   REHAB POTENTIAL: Good  CLINICAL DECISION MAKING: Evolving/moderate complexity  EVALUATION COMPLEXITY: Moderate  PLAN:  PT FREQUENCY: 2x/week  PT DURATION: 8 weeks  PLANNED INTERVENTIONS: 97164- PT Re-evaluation, 97110-Therapeutic exercises, 97530- Therapeutic activity, 97112- Neuromuscular re-education, 97535- Self Care, 02859- Manual therapy, [phone]- Gait training, Patient/Family education, Balance training, Stair training, and DME instructions  PLAN FOR NEXT SESSION: did pt get rollator? Review standing PWR moves as needed, work on incr stride length, foot clearance tasks. Any balance tasks working on larger amplitude movements.    Sheffield LOISE Senate, PT,DPT 07/31/2023, 4:26 PM

## 2023-08-01 ENCOUNTER — Ambulatory Visit

## 2023-08-01 ENCOUNTER — Ambulatory Visit: Admitting: Behavioral Health

## 2023-08-01 VITALS — BP 129/85 | HR 66

## 2023-08-01 DIAGNOSIS — F339 Major depressive disorder, recurrent, unspecified: Secondary | ICD-10-CM | POA: Diagnosis not present

## 2023-08-01 NOTE — Progress Notes (Signed)
 Benn Tarver Kirwan [id number] June 27, 1950 73 y.o.  Subjective:   Patient ID:  Eric [name] is a 72 y.o. (DOB [birth year]) male.  Chief Complaint: No chief complaint on file.   HPI Eric [name] presents to the office today for follow-up of  treatment resistant depression (TRD).   Spravato treatment    Patient was administered Spravato 84 mg intranasally today. Patient was observed by provider throughout Spravato treatment. The patient experienced the typical dissociation which gradually resolved over the 2-hour period of observation. There were no complications. Specifically, the patient did not have any untoward side effects - feeling disconnected from themself, their thoughts, feelings and things around them, dizziness, nausea, feeling sleepy, decreased feeling of sensitivity (numbness) spinning sensation, feeling anxious, lack of energy, increased blood pressure, feeling happy or very excited, or headache. Blood pressures remained within normal ranges at the 40-minute and 2-hour follow-up intervals. By the time the 2-hour observation period was met the patient was alert and oriented and able to exit without assistance. Patient willing to continue Spravato administration for the treatment of resistant depression.    Reports Depression 6/10 today. Feels like depression is starting to improve.  No question or concerns     ECT-MADRS    Flowsheet Row Office Visit from 06/18/2023 in [facility] Office Visit from 04/16/2023 in [facility]  MADRS Total Score 37 36   PHQ2-9    Flowsheet Row Office Visit from 03/15/2020 in [facility] Total Score 3  PHQ-9 Total Score 9     Review of Systems:  Review of Systems  Constitutional: Negative.   Allergic/Immunologic: Negative.   Neurological: Negative.   Psychiatric/Behavioral:  Positive for dysphoric mood.      Medications: I have reviewed the patient's current medications.  Current Outpatient Medications  Medication Sig Dispense Refill   ALPRAZolam  (XANAX ) 0.25 MG tablet TAKE 1 TABLET (0.25 MG TOTAL) BY MOUTH 2 (TWO) TIMES DAILY AS NEEDED FOR ANXIETY OR SLEEP. 30 tablet 1   aspirin 81 MG tablet Take 81 mg by mouth daily.     Cholecalciferol (VITAMIN D-3) 5000 units TABS Take 5,000 Units by mouth daily.      DULoxetine  (CYMBALTA ) 30 MG capsule Take 3 capsules (90 mg total) by mouth daily. 270 capsule 0   methylphenidate  36 MG PO CR tablet Take 1 tablet (36 mg total) by mouth daily. 30 tablet 0   naltrexone  (DEPADE) 50 MG tablet Take 0.5 tablets (25 mg total) by mouth daily. 45 tablet 1   pregabalin  (LYRICA ) 50 MG capsule Take 1 capsule (50 mg total) by mouth at bedtime. 90 capsule 0   Semaglutide ,0.25 or 0.5MG /DOS, (OZEMPIC , 0.25 OR 0.5 MG/DOSE,) 2 MG/1.5ML SOPN Inject 2.4 mg into the skin once a week.     simvastatin (ZOCOR) 20 MG tablet Take 20 mg by mouth at bedtime.     ZEPBOUND  10 MG/0.5ML Pen Inject 10 mg into the skin once a week.     No current facility-administered medications for this visit.    Medication Side Effects: None  Allergies:  Allergies  Allergen Reactions   E.E.S. [Erythromycin] Hives   Macrolides And Ketolides Other (See Comments)    EES    Rosuvastatin     Other reaction(s): cramps    Past Medical History:  Diagnosis Date   Allergy    Anemia    iron- pt denies    Anxiety    Aortic  cusp regurgitation    Carotid artery occlusion    Constipation    Coronary artery stenosis    Hyperlipidemia    Lactose intolerance    Major depression, recurrent, chronic (HCC)    Obesity    OCD (obsessive compulsive disorder)    OSA (obstructive sleep apnea)    Other chronic pain    Periodic limb movement disorder    Periodic limb movements of sleep    Prediabetes    Pure hypercholesterolemia    Restless legs    Sleep apnea    wears cpap    Vitamin D deficiency      Past Medical History, Surgical history, Social history, and Family history were reviewed and updated as appropriate.   Please see review of systems for further details on the patient's review from today.   Objective:   Physical Exam:  There were no vitals taken for this visit.  Physical Exam Constitutional:      General: He is not in acute distress.    Appearance: Normal appearance.  Neurological:     Mental Status: He is alert and oriented to person, place, and time.     Gait: Gait normal.  Psychiatric:        Attention and Perception: Attention and perception normal. He does not perceive auditory or visual hallucinations.        Mood and Affect: Mood and affect normal. Mood is not anxious or depressed. Affect is not labile.        Speech: Speech normal.        Behavior: Behavior normal. Behavior is cooperative.        Thought Content: Thought content normal.        Cognition and Memory: Cognition and memory normal.        Judgment: Judgment normal.     Lab Review:  No results found for: NA, K, CL, CO2, GLUCOSE, BUN, CREATININE, CALCIUM, PROT, ALBUMIN, AST, ALT, ALKPHOS, BILITOT, GFRNONAA, GFRAA  No results found for: WBC, RBC, HGB, HCT, PLT, MCV, MCH, MCHC, RDW, LYMPHSABS, MONOABS, EOSABS, BASOSABS  No results found for: POCLITH, LITHIUM    No results found for: PHENYTOIN, PHENOBARB, VALPROATE, CBMZ   .res Assessment: Plan:   Recommendations:   Pt is very lethargic at 2 hour mark post session. Needs assistance walking.   Continue Spravato treatment. Pt did not have any questions or concerns today.  Will continue to f/u with Lorene Macintosh MD for medication management.      Eric A. Trevan Messman, NP     Diagnoses and all orders for this visit:  Recurrent major depression resistant to treatment [facility])     Please see After Visit Summary for patient specific instructions.  Future Appointments   Date Time Provider Department Center  08/02/2023 12:30 PM Annett Sheffield SAILOR, PT [facility]  08/06/2023  9:00 AM CP-NURSE [facility] None  08/06/2023  9:00 AM Teresa Eric LABOR, NP [facility] None  08/07/2023 12:30 PM Annett Sheffield SAILOR, PT [facility]  08/09/2023 12:30 PM Annett Sheffield SAILOR, PT [facility]  08/13/2023  1:30 PM Cottle, Lorene KANDICE Raddle., MD [facility] None  08/14/2023 11:00 AM Annett Sheffield SAILOR, PT [facility]  08/16/2023 12:30 PM Annett Sheffield SAILOR, PT [facility]  08/19/2023 11:00 AM Annett Sheffield SAILOR, PT [facility]  08/21/2023  8:00 AM Annett Sheffield SAILOR, PT [facility]  08/28/2023 12:30 PM Plaster, Marlon BRAVO, PT [facility]  08/30/2023 12:30 PM Plaster, Marlon BRAVO, PT [facility]  09/04/2023 12:30 PM Gilgannon,  Sheffield SAILOR, PT [facility]  09/06/2023 12:30 PM Annett Sheffield SAILOR, PT [facility]  09/17/2023  1:00 PM Cottle, Lorene KANDICE Raddle., MD [facility] None  10/15/2023  1:30 PM Cottle, Lorene KANDICE Raddle., MD [facility] None  01/30/2024  3:00 PM Tat, Asberry RAMAN, DO [facility] None    No orders of the defined types were placed in this encounter.   -------------------------------

## 2023-08-01 NOTE — Progress Notes (Signed)
 NURSES NOTE:         Pt arrived for his 11 th Spravato Treatment for treatment resistant depression, the starting dose was 56 mg (2 of the 28 mg nasal sprays) and he tolerated it well with no side effects or complaints. Pt reports no issues after his last treatment so he will continue 84 mg which is maintenance dose and he will continue as long as tolerating it well. Eric [name] is a patient of Dr. Calhoun so he will follow his care throughout treatments and follow ups. Pt's Spravato is a medical authorization through buy and bill.  Spravato medication is stored at treatment center per REMS/FDA guidelines. The medication is required to be locked behind two doors per REMS/FDA protocol. Medication is also disposed of properly after each use per regulations. All documentation for REMS is completed and submitted per FDA/REMS requirements.          Began taking patient's vital signs at 9:12 AM 121/85, pulse 65, SpO2 95%. Instructed patient to blow his nose if needed then recline back to a 45 degree angle. Gave patient first dose 28 mg nasal spray, administered in each nostril as directed and observed by nurse, waited 5 more minutes for the second and third doses. After all doses given pt did not complain of any nausea/vomiting. Assessed his 40 minute vitals, 9:58 AM, 137/74, pulse 66, SpO2 92%. Pt was very sedate then. Explained he would be monitored for a total time of 120 minutes. Pt remains very sedate even at end of his treatments. Discharge vitals were taken at 11:05 AM 129/85, P 66, SpO2 94%. [name], NP came to visit with patient once his thoughts were clearer to discuss how treatment went. Recommend he go home and sleep or just relax on the couch. No driving, no intense activities. Verbalized understanding. Pt. will be receiving 2 treatments per week for a bit longer. Nurse was with pt a total of 60 minutes for clinical assessment. Pt is always taken down via wheelchair due to pt's unsteady gait prior  to treatments. Pt is scheduled Tuesdays and Thursdays. Instructed to call with any issues.     LOT [id number] EXP JAN [id number]

## 2023-08-02 ENCOUNTER — Encounter: Payer: Self-pay | Admitting: Physical Therapy

## 2023-08-02 ENCOUNTER — Ambulatory Visit: Admitting: Physical Therapy

## 2023-08-02 VITALS — BP 133/82 | HR 89

## 2023-08-02 DIAGNOSIS — R2689 Other abnormalities of gait and mobility: Secondary | ICD-10-CM | POA: Diagnosis not present

## 2023-08-02 DIAGNOSIS — R293 Abnormal posture: Secondary | ICD-10-CM | POA: Diagnosis not present

## 2023-08-02 DIAGNOSIS — M6281 Muscle weakness (generalized): Secondary | ICD-10-CM | POA: Diagnosis not present

## 2023-08-02 DIAGNOSIS — R2681 Unsteadiness on feet: Secondary | ICD-10-CM | POA: Diagnosis not present

## 2023-08-02 DIAGNOSIS — G20C Parkinsonism, unspecified: Secondary | ICD-10-CM | POA: Diagnosis not present

## 2023-08-02 NOTE — Therapy (Signed)
 OUTPATIENT PHYSICAL THERAPY NEURO TREATMENT   Patient Name: Eric [name] MRN: [id number] DOB:January 30, 1950, 73 y.o., male Today's Date: 08/02/2023   PCP: Regino Slater, MD   REFERRING PROVIDER: Evonnie Asberry RAMAN, DO    END OF SESSION:  PT End of Session - 08/02/23 1230     Visit Number 3    Number of Visits 13    Date for PT Re-Evaluation 09/21/23    Authorization Type [employer]ICARE    PT Start Time 1230    PT Stop Time 1313    PT Time Calculation (min) 43 min    Equipment Utilized During Treatment Gait belt    Activity Tolerance Patient tolerated treatment well    Behavior During Therapy WFL for tasks assessed/performed          Past Medical History:  Diagnosis Date   Allergy    Anemia    iron- pt denies    Anxiety    Aortic cusp regurgitation    Carotid artery occlusion    Constipation    Coronary artery stenosis    Hyperlipidemia    Lactose intolerance    Major depression, recurrent, chronic (HCC)    Obesity    OCD (obsessive compulsive disorder)    OSA (obstructive sleep apnea)    Other chronic pain    Periodic limb movement disorder    Periodic limb movements of sleep    Prediabetes    Pure hypercholesterolemia    Restless legs    Sleep apnea    wears cpap    Vitamin D deficiency    Past Surgical History:  Procedure Laterality Date   COLONOSCOPY     DG BARIUM ENEMA ([facility] HX)     10-19-2010- turtous, elongated colon    HEMORRHOID SURGERY     TONSILLECTOMY  1960   Patient Active Problem List   Diagnosis Date Noted   Vitamin D deficiency 03/30/2020   Pure hypercholesterolemia 03/30/2020   Prediabetes 03/30/2020   Occlusion and stenosis of bilateral carotid arteries 03/30/2020   Obstructive sleep apnea syndrome 03/30/2020   Aortic cusp regurgitation 03/30/2020   Restless legs syndrome 03/30/2020   Moderate recurrent major depression (HCC) 03/30/2020   Chronic pain 03/30/2020   OCD (obsessive compulsive disorder)  10/15/2017   Family history of ovarian cancer 04/19/2011   Family history of breast cancer 04/19/2011    ONSET DATE: 07/12/2023   REFERRING DIAG: G20.C (ICD-10-CM) - Primary parkinsonism (HCC)   THERAPY DIAG:  Unsteadiness on feet  Other abnormalities of gait and mobility  Abnormal posture  Muscle weakness (generalized)  Rationale for Evaluation and Treatment: Rehabilitation  SUBJECTIVE:  SUBJECTIVE STATEMENT: Pt presents to PT treatment session using SPC with a shuffling gait. Denies falls / close calls since last session. Pt reports he's walking well, noticing that he's taking bigger strides as he walks. Did his HEP this morning. Rollator has been ordered but has not come in yet.    Pt accompanied by: Alfreda Shuck  PERTINENT HISTORY: PMH: Parkinsonism (Possibly due to exposure to antipsychotic medication), tardive dyskinesias, restless leg syndrome, CAD, pre-diabetes, obesity, OCD, major depression,HLD  He was not exposed to anything for very long, but wife reports that symptoms have really been developing over the last 5 to 6 weeks. He was on Abilify  for a very short time in February and March and then was on Vraylar  from March until June. The symptoms developed around that time. He is off of the Vraylar  now (just went off of it)   Per Dr. Evonnie regarding abnormal brain scan:  It does appear that there is heavy basal ganglia calcification.  We can certainly confirm this with CT.  While Fahrs disease is on the differential, the timeline for his symptoms really correlates more with the timeline for when he was placed on antipsychotic medication.   PAIN:  Are you having pain? No Has generalized body pain that is taken care of the Lyrica , reports R knee is the bad one  Vitals:   08/02/23 1237   BP: 133/82  Pulse: 89  *sitting (start of session)    PRECAUTIONS: Fall  FALLS: Has patient fallen in last 6 months? No and almost falls grabbing the wall   LIVING ENVIRONMENT: Lives with: lives with their spouse Lives in: House/apartment Stairs: Yes: Internal: 12 steps; on left going up and External: 4 and 5 steps; none Has following equipment at home: Single point cane and Grab bars  PLOF: Independent and Leisure: doesn't have any hobbies except cleaning up the house  PATIENT GOALS: Wants to get back to his regular gait and his regular walk so he can go on trips   OBJECTIVE:  Note: Objective measures were completed at Evaluation unless otherwise noted.  DIAGNOSTIC FINDINGS: MRI brain: IMPRESSION: 1. No acute intracranial abnormality. 2. 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 Head CT may be helpful for further assessment of the degree of mineralization. 3. Findings of chronic small vessel ischemia.  COGNITION: Overall cognitive status: Within functional limits for tasks assessed   SENSATION: WFL  COORDINATION: Heel to shin: some bradykinesia RLE RAM: dysmetric BUE     POSTURE: rounded shoulders, forward head, and posterior pelvic tilt   LOWER EXTREMITY MMT:    MMT Right Eval Left Eval  Hip flexion 3- 4  Hip extension    Hip abduction    Hip adduction    Hip internal rotation    Hip external rotation    Knee flexion 4 5  Knee extension 4 4  Ankle dorsiflexion 5 5  Ankle plantarflexion    Ankle inversion    Ankle eversion    (Blank rows = not tested)  BED MOBILITY:   Pt reports some difficulties getting under the sheets    TRANSFERS: Sit to stand: Modified independence  Assistive device utilized: None     Stand to sit: Modified independence  Assistive device utilized: None     Pt reports difficulties getting up from lower  surfaces or in and out of the car.   GAIT: Findings: Gait Characteristics: decreased arm swing- Right,  decreased arm swing- Left, decreased step length- Right, decreased step length- Left, decreased stride length, decreased hip/knee flexion- Right, decreased hip/knee flexion- Left, decreased ankle dorsiflexion- Right, decreased ankle dorsiflexion- Left, Right foot flat, Left foot flat, shuffling, decreased trunk rotation, trunk flexed, and narrow BOS, Distance walked: Clinic distances, Assistive device utilized:Single point cane and None, Level of assistance: CGA, and Comments: Pt with very shuffled steps. When pt gets single HHA from therapist for a couple steps, pt able to demo improved step length   FUNCTIONAL TESTS:  5 times sit to stand: 15.6 seconds with no UE support, incr forward flexed posture in standing  Timed up and go (TUG): 27.4 seconds with no AD, CGA with en bloc turning 10 meter walk test: 62 seconds with no AD and CGA = .52 ft/sec                                                                                                                                TREATMENT DATE: 08/02/23  NMR:  Foot taps using 4 different colored dots on the stairs (2 dots on 1st step and 2 dots on 2nd step) to facilitate increased hip/knee flexion, and DF RPE: 6/10 Pt challenged with attaining/maintaining wide BoS, required mod verbal cues and was able self-correct  Pt challenged with bilat foot clearance, required mod-max verbal cues and was able to self-correct with decent carryover 6 rounds of blaze pods at the stairs  RPE: 7/10 Round 1 2x: 17 hits, 22 hits; blaze pods oriented to the L side of the stairs - CGA Unilateral LUE for support > L 2 fingers  Round 2 2x: 19 hits, 18 hits; blaze pods oriented to the R side of the stairs - CGA Unilateral RUE for support > R 2 fingers  Round 3 2x: 18 hits, 20 hits; blaze pods oriented to the middle of the stairs - CGA R 2 fingers for support per pt  preference Seated lateral step up and over's using small orange hurdles - 10 reps each leg to facilitate increase hip/knee flexion and hip abduction RPE: 7/10 Pt demonstrated bilat muscular weakness and lack of BLE force production   Gait Training:  Standing overground ambulation, 4x forward steps up and over's 4 small orange hurdles - CGA RPE: 7/10  Pt had one LOB initially d/t lack of foot clearance, was able to catch himself Pt continues to exhibit decreased step/stride length, decreased bilat hip/knee flexion, and decreased bilat DF although demonstrated slight improvements since last session Pt had fewer errors and reps increased but then regressed d/t bilat muscular weakness and lack of BLE force production PT proceeded to administer a ~1 minute seated rest/water break    PATIENT EDUCATION: Education details: see above, continue HEP  Person educated: Patient and Spouse Education method: Explanation, Demonstration, Verbal cues, and Handouts Education comprehension: verbalized understanding, returned demonstration, verbal cues required, and needs further education  HOME EXERCISE PROGRAM: Standing PWR Moves  GOALS: Goals reviewed with patient? Yes  SHORT TERM GOALS: Target date: 08/13/2023   1. Pt will be independent with initial HEP with gait, balance, strength in order to build upon functional gains made in therapy. Baseline: Goal status: INITIAL  2.  Pt will improve TUG time to 22 seconds or less with no AD vs. LRAD in order to demo decrease fall risk. Baseline: 27.4 seconds with no AD, CGA Goal status: INITIAL  3.  Pt will improve gait speed with LRAD to at least 1.3 ft/sec in order to demo decr fall risk/improved household mobility.  Baseline: 62 seconds with no AD = .52 ft/sec and CGA Goal status: INITIAL  4.  Pt will ambulate at least 230' with supervision with LRAD over level indoor surfaces in order to demo improved household mobility.  Baseline: pt ambulates  with no AD with CGA  Goal status: INITIAL   LONG TERM GOALS: Target date: 09/03/2023   Pt will be independent with final HEP with gait, balance, strength in order to build upon functional gains made in therapy. Baseline:  Goal status: INITIAL  2.  Pt will improve TUG time to 17 seconds or less with no AD vs. LRAD in order to demo decrease fall risk. Baseline: 27.4 seconds with no AD, CGA Goal status: INITIAL  3.  Pt will improve 5x sit<>stand to less than or equal to 13 sec to demonstrate improved functional strength and transfer efficiency.  Baseline: 15.6 seconds with no UE support, incr forward flexed posture in standing Goal status: INITIAL  4.  Pt will improve gait speed with LRAD to at least 1.8 ft/sec in order to demo decr fall risk  Baseline: 62 seconds with no AD = .52 ft/sec and CGA Goal status: INITIAL  5.  Pt will ambulate at least 500' with supervision with LRAD over outdoor unlevel surfaces in order to demo improved community mobility.  Baseline:  Goal status: INITIAL    ASSESSMENT:  CLINICAL IMPRESSION: Pt was seen for skilled PT treatment session focused on NMR and gait training in order to improve gait efficiency and decrease risk of falls. Pt put forth great effort during today's session and had no increase in R knee pain throughout the entirety of the session. Pt highly benefits from PT instructions and demonstrations prior to engaging in various tasks. First half of the session was spent targeting increased hip/knee flexion, and DF with success. Latter half of the session was spent gait training, NMR training seemed to slightly carry over. Pt continues to exhibit decreased step/stride length, decreased bilat hip/knee flexion, and decreased bilat DF although demonstrated slight improvements since last session. Pt had fewer errors and reps increased but then regressed d/t bilat muscular weakness and lack of BLE force production. Continue POC.   OBJECTIVE  IMPAIRMENTS: Abnormal gait, decreased activity tolerance, decreased balance, decreased coordination, decreased knowledge of condition, decreased knowledge of use of DME, decreased mobility, difficulty walking, decreased strength, decreased safety awareness, impaired flexibility, and postural dysfunction.   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   REHAB POTENTIAL: Good  CLINICAL DECISION MAKING: Evolving/moderate complexity  EVALUATION COMPLEXITY: Moderate  PLAN:  PT FREQUENCY: 2x/week  PT DURATION: 8 weeks  PLANNED INTERVENTIONS: 97164- PT Re-evaluation, 97110-Therapeutic exercises, 97530- Therapeutic activity, [id number]- Neuromuscular re-education, 97535- Self Care, [id number]- Manual therapy, [phone]- Gait training, Patient/Family education, Balance training, Stair training, and DME instructions  PLAN FOR NEXT SESSION: did pt get rollator? Review standing PWR moves as needed, work on incr stride length, foot clearance tasks. Any balance tasks working on larger amplitude movements.    Waddell Nailer, Student-PT,DPT 08/02/2023, 2:31 PM

## 2023-08-06 ENCOUNTER — Ambulatory Visit

## 2023-08-06 ENCOUNTER — Ambulatory Visit (INDEPENDENT_AMBULATORY_CARE_PROVIDER_SITE_OTHER): Admitting: Behavioral Health

## 2023-08-06 VITALS — BP 130/70 | HR 67

## 2023-08-06 DIAGNOSIS — F339 Major depressive disorder, recurrent, unspecified: Secondary | ICD-10-CM

## 2023-08-06 NOTE — Progress Notes (Signed)
 Eric [name] [id number] Sep 12, 1950 73 y.o.  Subjective:   Patient ID:  Eric [name] is a 73 y.o. (DOB August 19, 1950) male.  Chief Complaint: No chief complaint on file.   HPI Braxson Hollingsworth. Fike presents to the office today for follow-up of  treatment resistant depression (TRD).   Spravato treatment    Patient was administered Spravato 84 mg intranasally today. Patient was observed by provider throughout Spravato treatment. The patient experienced the typical dissociation which gradually resolved over the 2-hour period of observation. There were no complications. Specifically, the patient did not have any untoward side effects - feeling disconnected from themself, their thoughts, feelings and things around them, dizziness, nausea, feeling sleepy, decreased feeling of sensitivity (numbness) spinning sensation, feeling anxious, lack of energy, increased blood pressure, feeling happy or very excited, or headache. Blood pressures remained within normal ranges at the 40-minute and 2-hour follow-up intervals. By the time the 2-hour observation period was met the patient was alert and oriented and able to exit without assistance. Patient willing to continue Spravato administration for the treatment of resistant depression.    Reports Depression 4/10 today. Feels like depression is starting to improve.  No question or concerns     ECT-MADRS    Flowsheet Row Office Visit from 06/18/2023 in [facility] Office Visit from 04/16/2023 in [facility]  MADRS Total Score 37 36   PHQ2-9    Flowsheet Row Office Visit from 03/15/2020 in [facility] Total Score 3  PHQ-9 Total Score 9     Review of Systems:  Review of Systems  Constitutional: Negative.   Neurological: Negative.     Medications: I have reviewed the patient's current medications.  Current Outpatient Medications   Medication Sig Dispense Refill   ALPRAZolam  (XANAX ) 0.25 MG tablet TAKE 1 TABLET (0.25 MG TOTAL) BY MOUTH 2 (TWO) TIMES DAILY AS NEEDED FOR ANXIETY OR SLEEP. 30 tablet 1   aspirin 81 MG tablet Take 81 mg by mouth daily.     Cholecalciferol (VITAMIN D-3) 5000 units TABS Take 5,000 Units by mouth daily.      DULoxetine  (CYMBALTA ) 30 MG capsule Take 3 capsules (90 mg total) by mouth daily. 270 capsule 0   methylphenidate  36 MG PO CR tablet Take 1 tablet (36 mg total) by mouth daily. 30 tablet 0   naltrexone  (DEPADE) 50 MG tablet Take 0.5 tablets (25 mg total) by mouth daily. 45 tablet 1   pregabalin  (LYRICA ) 50 MG capsule Take 1 capsule (50 mg total) by mouth at bedtime. 90 capsule 0   Semaglutide ,0.25 or 0.5MG /DOS, (OZEMPIC , 0.25 OR 0.5 MG/DOSE,) 2 MG/1.5ML SOPN Inject 2.4 mg into the skin once a week.     simvastatin (ZOCOR) 20 MG tablet Take 20 mg by mouth at bedtime.     ZEPBOUND  10 MG/0.5ML Pen Inject 10 mg into the skin once a week.     No current facility-administered medications for this visit.    Medication Side Effects: None  Allergies:  Allergies  Allergen Reactions   E.E.S. [Erythromycin] Hives   Macrolides And Ketolides Other (See Comments)    EES    Rosuvastatin     Other reaction(s): cramps    Past Medical History:  Diagnosis Date   Allergy    Anemia    iron- pt denies    Anxiety    Aortic cusp regurgitation    Carotid artery occlusion    Constipation  Coronary artery stenosis    Hyperlipidemia    Lactose intolerance    Major depression, recurrent, chronic (HCC)    Obesity    OCD (obsessive compulsive disorder)    OSA (obstructive sleep apnea)    Other chronic pain    Periodic limb movement disorder    Periodic limb movements of sleep    Prediabetes    Pure hypercholesterolemia    Restless legs    Sleep apnea    wears cpap    Vitamin D deficiency     Past Medical History, Surgical history, Social history, and Family history were reviewed and  updated as appropriate.   Please see review of systems for further details on the patient's review from today.   Objective:   Physical Exam:  There were no vitals taken for this visit.  Physical Exam  Lab Review:  No results found for: NA, K, CL, CO2, GLUCOSE, BUN, CREATININE, CALCIUM, PROT, ALBUMIN, AST, ALT, ALKPHOS, BILITOT, GFRNONAA, GFRAA  No results found for: WBC, RBC, HGB, HCT, PLT, MCV, MCH, MCHC, RDW, LYMPHSABS, MONOABS, EOSABS, BASOSABS  No results found for: POCLITH, LITHIUM    No results found for: PHENYTOIN, PHENOBARB, VALPROATE, CBMZ   .res Assessment: Plan:    Recommendations:   Pt is very lethargic at 2 hour mark post session. Needs assistance walking.    Continue Spravato treatment. Pt did not have any questions or concerns today.  Will continue to f/u with Lorene Macintosh MD for medication management.      Redell A. Suhas Estis, NP        Diagnoses and all orders for this visit:  Recurrent major depression resistant to treatment [facility])     Please see After Visit Summary for patient specific instructions.  Future Appointments  Date Time Provider Department Center  08/07/2023 12:30 PM Annett Sheffield SAILOR, PT [facility]  08/08/2023  9:00 AM CP-NURSE [facility] None  08/08/2023  9:00 AM Teresa Redell LABOR, NP [facility] None  08/09/2023 12:30 PM Annett Sheffield SAILOR, PT [facility]  08/13/2023  1:30 PM Cottle, Lorene KANDICE Raddle., MD [facility] None  08/14/2023 11:00 AM Annett Sheffield SAILOR, PT [facility]  08/16/2023 12:30 PM Annett Sheffield SAILOR, PT [facility]  08/19/2023 11:00 AM Annett Sheffield SAILOR, PT [facility]  08/21/2023  8:00 AM Annett Sheffield SAILOR, PT [facility]  08/28/2023 12:30 PM Plaster, Marlon BRAVO, PT [facility]  08/30/2023 12:30 PM Plaster, Marlon BRAVO, PT [facility]  09/04/2023 12:30 PM Annett Sheffield SAILOR, PT [facility]  09/06/2023 12:30 PM Annett Sheffield SAILOR, PT [facility]  09/17/2023   1:00 PM Cottle, Lorene KANDICE Raddle., MD [facility] None  10/15/2023  1:30 PM Cottle, Lorene KANDICE Raddle., MD [facility] None  01/30/2024  3:00 PM Tat, Asberry RAMAN, DO [facility] None    No orders of the defined types were placed in this encounter.   -------------------------------

## 2023-08-06 NOTE — Progress Notes (Signed)
 NURSES NOTE:         Pt arrived for his 66 th Spravato Treatment for treatment resistant depression, the starting dose was 56 mg (2 of the 28 mg nasal sprays) and he tolerated it well with no side effects or complaints. Pt reports no issues after his last treatment so he will continue 84 mg which is maintenance dose and he will continue as long as tolerating it well. Eric [name] is a patient of Dr. Calhoun so he will follow his care throughout treatments and follow ups. Pt's Spravato is a medical authorization through buy and bill.  Spravato medication is stored at treatment center per REMS/FDA guidelines. The medication is required to be locked behind two doors per REMS/FDA protocol. Medication is also disposed of properly after each use per regulations. All documentation for REMS is completed and submitted per FDA/REMS requirements.          Began taking patient's vital signs at 9:07 AM 133/71, pulse 69, SpO2 95%. Instructed patient to blow his nose if needed then recline back to a 45 degree angle. Gave patient first dose 28 mg nasal spray, administered in each nostril as directed and observed by nurse, waited 5 more minutes for the second and third doses. After all doses given pt did not complain of any nausea/vomiting. Assessed his 40 minute vitals, 9:55 AM, 130/63, pulse 88, SpO2 90%. Pt was very sedate then. Explained he would be monitored for a total time of 120 minutes. Pt remains very sedate even at end of his treatments. Discharge vitals were taken at 11:01 AM 130/70, P 67, SpO2 92%. Eric Pizza, NP came to visit with patient once his thoughts were clearer to discuss how treatment went. Recommend he go home and sleep or just relax on the couch. No driving, no intense activities. Verbalized understanding. Pt. will be receiving 2 treatments per week for a bit longer. Nurse was with pt a total of 60 minutes for clinical assessment. Pt is always taken down via wheelchair due to pt's unsteady gait prior  to treatments. Pt is scheduled Tuesdays and Thursdays. Instructed to call with any issues.     LOT [id number] EXP JAN [id number]

## 2023-08-07 ENCOUNTER — Encounter: Payer: Self-pay | Admitting: Physical Therapy

## 2023-08-07 ENCOUNTER — Ambulatory Visit: Admitting: Physical Therapy

## 2023-08-07 VITALS — BP 133/71 | HR 85

## 2023-08-07 DIAGNOSIS — M6281 Muscle weakness (generalized): Secondary | ICD-10-CM | POA: Diagnosis not present

## 2023-08-07 DIAGNOSIS — R2689 Other abnormalities of gait and mobility: Secondary | ICD-10-CM | POA: Diagnosis not present

## 2023-08-07 DIAGNOSIS — R2681 Unsteadiness on feet: Secondary | ICD-10-CM

## 2023-08-07 DIAGNOSIS — K080 Exfoliation of teeth due to systemic causes: Secondary | ICD-10-CM | POA: Diagnosis not present

## 2023-08-07 DIAGNOSIS — G20C Parkinsonism, unspecified: Secondary | ICD-10-CM | POA: Diagnosis not present

## 2023-08-07 DIAGNOSIS — R293 Abnormal posture: Secondary | ICD-10-CM | POA: Diagnosis not present

## 2023-08-07 DIAGNOSIS — G9389 Other specified disorders of brain: Secondary | ICD-10-CM | POA: Diagnosis not present

## 2023-08-07 NOTE — Therapy (Signed)
 OUTPATIENT PHYSICAL THERAPY NEURO TREATMENT   Patient Name: Eric [name] MRN: [id number] DOB:07-Oct-1950, 73 y.o., male Today's Date: 08/07/2023   PCP: Regino Slater, MD   REFERRING PROVIDER: Evonnie Asberry RAMAN, DO    END OF SESSION:  PT End of Session - 08/07/23 1236     Visit Number 4    Number of Visits 13    Date for PT Re-Evaluation 09/21/23    Authorization Type [employer] MEDICARE    PT Start Time 1230    PT Stop Time 1311    PT Time Calculation (min) 41 min    Equipment Utilized During Treatment Gait belt    Activity Tolerance Patient tolerated treatment well    Behavior During Therapy WFL for tasks assessed/performed          Past Medical History:  Diagnosis Date   Allergy    Anemia    iron- pt denies    Anxiety    Aortic cusp regurgitation    Carotid artery occlusion    Constipation    Coronary artery stenosis    Hyperlipidemia    Lactose intolerance    Major depression, recurrent, chronic (HCC)    Obesity    OCD (obsessive compulsive disorder)    OSA (obstructive sleep apnea)    Other chronic pain    Periodic limb movement disorder    Periodic limb movements of sleep    Prediabetes    Pure hypercholesterolemia    Restless legs    Sleep apnea    wears cpap    Vitamin D deficiency    Past Surgical History:  Procedure Laterality Date   COLONOSCOPY     DG BARIUM ENEMA ([facility] HX)     10-19-2010- turtous, elongated colon    HEMORRHOID SURGERY     TONSILLECTOMY  1960   Patient Active Problem List   Diagnosis Date Noted   Vitamin D deficiency 03/30/2020   Pure hypercholesterolemia 03/30/2020   Prediabetes 03/30/2020   Occlusion and stenosis of bilateral carotid arteries 03/30/2020   Obstructive sleep apnea syndrome 03/30/2020   Aortic cusp regurgitation 03/30/2020   Restless legs syndrome 03/30/2020   Moderate recurrent major depression (HCC) 03/30/2020   Chronic pain 03/30/2020   OCD (obsessive compulsive disorder)  10/15/2017   Family history of ovarian cancer 04/19/2011   Family history of breast cancer 04/19/2011    ONSET DATE: 07/12/2023   REFERRING DIAG: G20.C (ICD-10-CM) - Primary parkinsonism (HCC)   THERAPY DIAG:  Unsteadiness on feet  Other abnormalities of gait and mobility  Muscle weakness (generalized)  Abnormal posture  Rationale for Evaluation and Treatment: Rehabilitation  SUBJECTIVE:  SUBJECTIVE STATEMENT: Has a dentist appt later today. Got his rollator and brought it in today. Feels like he is more steady and takes bigger steps. No falls. Walker a quarter of a mile in the neighborhood this morning with his rollator. His trainer Eric [name] is here today.   Pt accompanied by: Eric [name] and trainer Eric [name]   PERTINENT HISTORY: PMH: Parkinsonism (Possibly due to exposure to antipsychotic medication), tardive dyskinesias, restless leg syndrome, CAD, pre-diabetes, obesity, OCD, major depression,HLD  He was not exposed to anything for very long, but wife reports that symptoms have really been developing over the last 5 to 6 weeks. He was on Abilify  for a very short time in February and March and then was on Vraylar  from March until June. The symptoms developed around that time. He is off of the Vraylar  now (just went off of it)   Per Dr. Evonnie regarding abnormal brain scan:  It does appear that there is heavy basal ganglia calcification.  We can certainly confirm this with CT.  While Fahrs disease is on the differential, the timeline for his symptoms really correlates more with the timeline for when he was placed on antipsychotic medication.   PAIN:  Are you having pain? No Has generalized body pain that is taken care of the [place] , reports R knee is the bad one  Vitals:   08/07/23 1241  BP: 133/71   Pulse: 85   *sitting (start of session)    PRECAUTIONS: Fall  FALLS: Has patient fallen in last 6 months? No and almost falls grabbing the wall   LIVING ENVIRONMENT: Lives with: lives with their spouse Lives in: House/apartment Stairs: Yes: Internal: 12 steps; on left going up and External: 4 and 5 steps; none Has following equipment at home: Single point cane and Grab bars  PLOF: Independent and Leisure: doesn't have any hobbies except cleaning up the house  PATIENT GOALS: Wants to get back to his regular gait and his regular walk so he can go on trips   OBJECTIVE:  Note: Objective measures were completed at Evaluation unless otherwise noted.  DIAGNOSTIC FINDINGS: MRI brain: IMPRESSION: 1. No acute intracranial abnormality. 2. 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 Head CT may be helpful for further assessment of the degree of mineralization. 3. Findings of chronic small vessel ischemia.  COGNITION: Overall cognitive status: Within functional limits for tasks assessed   SENSATION: WFL  COORDINATION: Heel to shin: some bradykinesia RLE RAM: dysmetric BUE     POSTURE: rounded shoulders, forward head, and posterior pelvic tilt   LOWER EXTREMITY MMT:    MMT Right Eval Left Eval  Hip flexion 3- 4  Hip extension    Hip abduction    Hip adduction    Hip internal rotation    Hip external rotation    Knee flexion 4 5  Knee extension 4 4  Ankle dorsiflexion 5 5  Ankle plantarflexion    Ankle inversion    Ankle eversion    (Blank rows = not tested)  BED MOBILITY:   Pt reports some difficulties getting under the sheets    TRANSFERS: Sit to stand: Modified independence  Assistive device utilized: None     Stand to sit: Modified independence  Assistive device utilized: None     Pt reports difficulties getting up from lower surfaces or  in and out of the car.   GAIT: Findings: Gait Characteristics: decreased arm swing-  Right, decreased arm swing- Left, decreased step length- Right, decreased step length- Left, decreased stride length, decreased hip/knee flexion- Right, decreased hip/knee flexion- Left, decreased ankle dorsiflexion- Right, decreased ankle dorsiflexion- Left, Right foot flat, Left foot flat, shuffling, decreased trunk rotation, trunk flexed, and narrow BOS, Distance walked: Clinic distances, Assistive device utilized:Single point cane and None, Level of assistance: CGA, and Comments: Pt with very shuffled steps. When pt gets single HHA from therapist for a couple steps, pt able to demo improved step length   FUNCTIONAL TESTS:  5 times sit to stand: 15.6 seconds with no UE support, incr forward flexed posture in standing  Timed up and go (TUG): 27.4 seconds with no AD, CGA with en bloc turning 10 meter walk test: 62 seconds with no AD and CGA = .52 ft/sec                                                                                                                                TREATMENT DATE: 08/07/23  Therapeutic Activity:  Pt brought in his rollator that he recently got, PT adjusted to proper height (it was initially too high) Discussed with trainer Art) what PT is addressing in regards to Parkinsonism - bigger movements, posture, foot clearance, stride length, balance, Eric [name] reports he is also working on the same principles with pt  SciFit with BUE/BLE Multi-peaks at gear 4.0 > 5.0 for 8 minutes to work on neural priming, incr amplitude of stepping, reciprocal movement patterns. Pt reporting RPE as 5>6/10   NMR:   With 4 blaze pods (2 on mirror in superior lateral direction) and 2 on floor to work on lateral stepping, foot clearance, trunk rotation, posture, reaching outside of BOS, reaction times, and visual scanning. Performed at ballet bar, trying not to use UE support for balance  Performed 2 bouts of 1  minute each standing on level ground and stepping over 2 obstacle: 11 hits, 12 hits Performed 2 bouts of 1 minute each standing on air ex: 17 hits, 15 hits (reaching across body to tap blaze pod on mirror) Cued for big tap with hands or feet as sometimes it took a couple of tries for pt to turn it off   On air ex: alternating lateral stepping over 4 obstacle, 10 reps each side, cues to not let foot hit obstacle, performed without UE support   Holding 6# medicine ball, performing 10 sit <> stands and lifting ball overhead for tall posture and tossing it to floor and catching it, performed an additional 10 reps on air ex, pt did a good job with posture and throwing ball down with enough amplitude to catch it each time. Pt reporting RPE as 8/10  During session performed gait with no AD with cues for incr stride length, looking ahead, and taking as few steps as possible to get to the destination (pt did respond well to this cue initially but then does have tendency to  return back to shorter shuffled steps), SBA/CGA    PATIENT EDUCATION: Education details: continue HEP  Person educated: Patient and Spouse Education method: Medical illustrator Education comprehension: verbalized understanding, returned demonstration, verbal cues required, and needs further education  HOME EXERCISE PROGRAM: Standing PWR Moves   GOALS: Goals reviewed with patient? Yes  SHORT TERM GOALS: Target date: 08/13/2023   1. Pt will be independent with initial HEP with gait, balance, strength in order to build upon functional gains made in therapy. Baseline: Goal status: INITIAL  2.  Pt will improve TUG time to 22 seconds or less with no AD vs. LRAD in order to demo decrease fall risk. Baseline: 27.4 seconds with no AD, CGA Goal status: INITIAL  3.  Pt will improve gait speed with LRAD to at least 1.3 ft/sec in order to demo decr fall risk/improved household mobility.  Baseline: 62 seconds with no AD =  .52 ft/sec and CGA Goal status: INITIAL  4.  Pt will ambulate at least 230' with supervision with LRAD over level indoor surfaces in order to demo improved household mobility.  Baseline: pt ambulates with no AD with CGA  Goal status: INITIAL   LONG TERM GOALS: Target date: 09/03/2023   Pt will be independent with final HEP with gait, balance, strength in order to build upon functional gains made in therapy. Baseline:  Goal status: INITIAL  2.  Pt will improve TUG time to 17 seconds or less with no AD vs. LRAD in order to demo decrease fall risk. Baseline: 27.4 seconds with no AD, CGA Goal status: INITIAL  3.  Pt will improve 5x sit<>stand to less than or equal to 13 sec to demonstrate improved functional strength and transfer efficiency.  Baseline: 15.6 seconds with no UE support, incr forward flexed posture in standing Goal status: INITIAL  4.  Pt will improve gait speed with LRAD to at least 1.8 ft/sec in order to demo decr fall risk  Baseline: 62 seconds with no AD = .52 ft/sec and CGA Goal status: INITIAL  5.  Pt will ambulate at least 500' with supervision with LRAD over outdoor unlevel surfaces in order to demo improved community mobility.  Baseline:  Goal status: INITIAL    ASSESSMENT:  CLINICAL IMPRESSION: Since last PT visit, pt has obtained rollator and brought it into clinic today. Pt demonstrating improved stride length with use of rollator compared to no AD. Between activities during session, performed gait with no AD - pt does respond initially well to cues to take as few steps as possible before reverting to more shuffled steps. Today's skilled session focused on initiating on SciFit for larger amplitude movements and weight shifting for balance and foot clearance tasks. Will continue per POC.   OBJECTIVE IMPAIRMENTS: Abnormal gait, decreased activity tolerance, decreased balance, decreased coordination, decreased knowledge of condition, decreased knowledge of use  of DME, decreased mobility, difficulty walking, decreased strength, decreased safety awareness, impaired flexibility, and postural dysfunction.   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   REHAB POTENTIAL: Good  CLINICAL DECISION MAKING: Evolving/moderate complexity  EVALUATION COMPLEXITY: Moderate  PLAN:  PT FREQUENCY: 2x/week  PT DURATION: 8 weeks  PLANNED INTERVENTIONS: 02835- PT Re-evaluation,  97110-Therapeutic exercises, 97530- Therapeutic activity, [id number]- Neuromuscular re-education, [phone]- Self Care, 02859- Manual therapy, [phone]- Gait training, Patient/Family education, Balance training, Stair training, and DME instructions  PLAN FOR NEXT SESSION: Review standing PWR moves as needed, work on incr stride length, foot clearance tasks. Any balance tasks working on larger amplitude movements posture and trunk rotation. Warm up on [place], PT,DPT 08/07/2023, 1:13 PM

## 2023-08-08 ENCOUNTER — Ambulatory Visit

## 2023-08-08 ENCOUNTER — Ambulatory Visit (INDEPENDENT_AMBULATORY_CARE_PROVIDER_SITE_OTHER): Admitting: Behavioral Health

## 2023-08-08 VITALS — BP 131/91 | HR 67

## 2023-08-08 DIAGNOSIS — F339 Major depressive disorder, recurrent, unspecified: Secondary | ICD-10-CM

## 2023-08-08 NOTE — Progress Notes (Signed)
 NURSES NOTE:         Pt arrived for his 13 th Spravato Treatment for treatment resistant depression, the starting dose was 56 mg (2 of the 28 mg nasal sprays) and he tolerated it well with no side effects or complaints. Pt reports no issues after his last treatment so he will continue 84 mg which is maintenance dose and he will continue as long as tolerating it well. Kolbie is a patient of Dr. Calhoun so he will follow his care throughout treatments and follow ups. Pt's Spravato is a medical authorization through buy and bill.  Spravato medication is stored at treatment center per REMS/FDA guidelines. The medication is required to be locked behind two doors per REMS/FDA protocol. Medication is also disposed of properly after each use per regulations. All documentation for REMS is completed and submitted per FDA/REMS requirements.          Began taking patient's vital signs at 9:05 AM 138/69, pulse 68, SpO2 96%. Instructed patient to blow his nose if needed then recline back to a 45 degree angle. Gave patient first dose 28 mg nasal spray, administered in each nostril as directed and observed by nurse, waited 5 more minutes for the second and third doses. After all doses given pt did not complain of any nausea/vomiting. Assessed his 40 minute vitals, 9:50 AM, 122/65, pulse 69, SpO2 95%. Explained he would be monitored for a total time of 120 minutes. Discharge vitals were taken at 11:02 AM 131/91, P 67, SpO2 95%. [name], NP came to visit with patient once his thoughts were clearer to discuss how treatment went. Recommend he go home and sleep or just relax on the couch. No driving, no intense activities. Verbalized understanding. Pt. will be receiving 2 treatments per week for a bit longer. Nurse was with pt a total of 60 minutes for clinical assessment. Pt was able to use his rolling walker on the way down to the vehicle, his gait is improving since starting therapy twice a week. Pt is scheduled  Tuesdays and Thursdays. Instructed to call with any issues.     LOT [id number] EXP JAN [phone]

## 2023-08-08 NOTE — Progress Notes (Signed)
 Eric [name] [id number] March 20, 1950 73 y.o.  Subjective:   Patient ID:  Eric [name] is a 73 y.o. (DOB 1951-01-02) male.  Chief Complaint: No chief complaint on file.   HPI Eric [name] presents to the office today for follow-up of  treatment resistant depression (TRD).   Spravato treatment    Patient was administered Spravato 84 mg intranasally today. Patient was observed by provider throughout Spravato treatment. The patient experienced the typical dissociation which gradually resolved over the 2-hour period of observation. There were no complications. Specifically, the patient did not have any untoward side effects - feeling disconnected from themself, their thoughts, feelings and things around them, dizziness, nausea, feeling sleepy, decreased feeling of sensitivity (numbness) spinning sensation, feeling anxious, lack of energy, increased blood pressure, feeling happy or very excited, or headache. Blood pressures remained within normal ranges at the 40-minute and 2-hour follow-up intervals. By the time the 2-hour observation period was met the patient was alert and oriented and able to exit without assistance. Patient willing to continue Spravato administration for the treatment of resistant depression.    Reports Depression 4/10 today. Feels like depression is starting to improve.  No question or concerns        ECT-MADRS    Flowsheet Row Office Visit from 06/18/2023 in [facility] Office Visit from 04/16/2023 in [facility]  MADRS Total Score 37 36   PHQ2-9    Flowsheet Row Office Visit from 03/15/2020 in [facility] Total Score 3  PHQ-9 Total Score 9     Review of Systems:  Review of Systems  Constitutional: Negative.   Allergic/Immunologic: Negative.   Neurological: Negative.     Medications: I have reviewed the patient's current  medications.  Current Outpatient Medications  Medication Sig Dispense Refill   ALPRAZolam  (XANAX ) 0.25 MG tablet TAKE 1 TABLET (0.25 MG TOTAL) BY MOUTH 2 (TWO) TIMES DAILY AS NEEDED FOR ANXIETY OR SLEEP. 30 tablet 1   aspirin 81 MG tablet Take 81 mg by mouth daily.     Cholecalciferol (VITAMIN D-3) 5000 units TABS Take 5,000 Units by mouth daily.      DULoxetine  (CYMBALTA ) 30 MG capsule Take 3 capsules (90 mg total) by mouth daily. 270 capsule 0   methylphenidate  36 MG PO CR tablet Take 1 tablet (36 mg total) by mouth daily. 30 tablet 0   naltrexone  (DEPADE) 50 MG tablet Take 0.5 tablets (25 mg total) by mouth daily. 45 tablet 1   pregabalin  (LYRICA ) 50 MG capsule Take 1 capsule (50 mg total) by mouth at bedtime. 90 capsule 0   Semaglutide ,0.25 or 0.5MG /DOS, (OZEMPIC , 0.25 OR 0.5 MG/DOSE,) 2 MG/1.5ML SOPN Inject 2.4 mg into the skin once a week.     simvastatin (ZOCOR) 20 MG tablet Take 20 mg by mouth at bedtime.     ZEPBOUND  10 MG/0.5ML Pen Inject 10 mg into the skin once a week.     No current facility-administered medications for this visit.    Medication Side Effects: None  Allergies:  Allergies  Allergen Reactions   E.E.S. [Erythromycin] Hives   Macrolides And Ketolides Other (See Comments)    EES    Rosuvastatin     Other reaction(s): cramps    Past Medical History:  Diagnosis Date   Allergy    Anemia    iron- pt denies    Anxiety    Aortic cusp regurgitation  Carotid artery occlusion    Constipation    Coronary artery stenosis    Hyperlipidemia    Lactose intolerance    Major depression, recurrent, chronic (HCC)    Obesity    OCD (obsessive compulsive disorder)    OSA (obstructive sleep apnea)    Other chronic pain    Periodic limb movement disorder    Periodic limb movements of sleep    Prediabetes    Pure hypercholesterolemia    Restless legs    Sleep apnea    wears cpap    Vitamin D deficiency     Past Medical History, Surgical history, Social  history, and Family history were reviewed and updated as appropriate.   Please see review of systems for further details on the patient's review from today.   Objective:   Physical Exam:  There were no vitals taken for this visit.  Physical Exam Constitutional:      General: He is not in acute distress.    Appearance: Normal appearance.  Neurological:     Mental Status: He is alert and oriented to person, place, and time.     Gait: Gait normal.  Psychiatric:        Attention and Perception: Attention and perception normal. He does not perceive auditory or visual hallucinations.        Mood and Affect: Mood and affect normal. Mood is not anxious or depressed. Affect is not labile.        Speech: Speech normal.        Behavior: Behavior normal. Behavior is cooperative.        Thought Content: Thought content normal.        Cognition and Memory: Cognition and memory normal.        Judgment: Judgment normal.     Lab Review:  No results found for: NA, K, CL, CO2, GLUCOSE, BUN, CREATININE, CALCIUM, PROT, ALBUMIN, AST, ALT, ALKPHOS, BILITOT, GFRNONAA, GFRAA  No results found for: WBC, RBC, HGB, HCT, PLT, MCV, MCH, MCHC, RDW, LYMPHSABS, MONOABS, EOSABS, BASOSABS  No results found for: POCLITH, LITHIUM    No results found for: PHENYTOIN, PHENOBARB, VALPROATE, CBMZ   .res Assessment: Plan:    Recommendations:   Pt is very lethargic at 2 hour mark post session. Needs assistance walking.    Continue Spravato treatment. Pt did not have any questions or concerns today.  Will continue to f/u with Lorene Macintosh MD for medication management.      Redell A. Aws Shere, NP       Diagnoses and all orders for this visit:  Recurrent major depression resistant to treatment [facility])     Please see After Visit Summary for patient specific instructions.  Future Appointments  Date Time Provider Department Center  08/09/2023  12:30 PM Annett Sheffield SAILOR, [place] [facility]  08/13/2023  1:30 PM Cottle, Lorene KANDICE Raddle., MD [facility]-CP None  08/14/2023 11:00 AM Annett Sheffield SAILOR, PT [facility]  08/16/2023 12:30 PM Annett Sheffield SAILOR, PT [facility]  08/19/2023 11:00 AM Annett Sheffield SAILOR, PT [facility]  08/21/2023  8:00 AM Annett Sheffield SAILOR, PT [facility]  08/28/2023 12:30 PM Plaster, Marlon BRAVO, PT [facility]  08/30/2023 12:30 PM Plaster, Marlon BRAVO, PT [facility]  09/04/2023 12:30 PM Annett Sheffield SAILOR, PT [facility]  09/06/2023 12:30 PM Annett Sheffield SAILOR, PT [facility]  09/17/2023  1:00 PM Cottle, Lorene KANDICE Raddle., MD CP-CP None  10/15/2023  1:30 PM Cottle, Lorene KANDICE Raddle., MD CP-CP None  01/30/2024  3:00 PM Tat, Asberry RAMAN, DO [facility] None    No orders of the defined types were placed in this encounter.   -------------------------------

## 2023-08-09 ENCOUNTER — Encounter: Payer: Self-pay | Admitting: Physical Therapy

## 2023-08-09 ENCOUNTER — Ambulatory Visit: Admitting: Physical Therapy

## 2023-08-09 VITALS — BP 121/80 | HR 89

## 2023-08-09 DIAGNOSIS — R2681 Unsteadiness on feet: Secondary | ICD-10-CM

## 2023-08-09 DIAGNOSIS — R293 Abnormal posture: Secondary | ICD-10-CM | POA: Diagnosis not present

## 2023-08-09 DIAGNOSIS — M6281 Muscle weakness (generalized): Secondary | ICD-10-CM

## 2023-08-09 DIAGNOSIS — R2689 Other abnormalities of gait and mobility: Secondary | ICD-10-CM | POA: Diagnosis not present

## 2023-08-09 DIAGNOSIS — G20C Parkinsonism, unspecified: Secondary | ICD-10-CM | POA: Diagnosis not present

## 2023-08-09 NOTE — Therapy (Signed)
 OUTPATIENT PHYSICAL THERAPY NEURO TREATMENT   Patient Name: Eric [name] MRN: [id number] DOB:[birth year], 73 y.o., male Today's Date: 08/09/2023   PCP: Regino Slater, MD   REFERRING PROVIDER: Evonnie Asberry RAMAN, DO    END OF SESSION:  PT End of Session - 08/09/23 1228     Visit Number 5    Number of Visits 13    Date for PT Re-Evaluation 09/21/23    Authorization Type [employer]ICARE    PT Start Time 1230    PT Stop Time 1315    PT Time Calculation (min) 45 min    Equipment Utilized During Treatment Gait belt    Activity Tolerance Patient tolerated treatment well    Behavior During Therapy WFL for tasks assessed/performed          Past Medical History:  Diagnosis Date   Allergy    Anemia    iron- pt denies    Anxiety    Aortic cusp regurgitation    Carotid artery occlusion    Constipation    Coronary artery stenosis    Hyperlipidemia    Lactose intolerance    Major depression, recurrent, chronic (HCC)    Obesity    OCD (obsessive compulsive disorder)    OSA (obstructive sleep apnea)    Other chronic pain    Periodic limb movement disorder    Periodic limb movements of sleep    Prediabetes    Pure hypercholesterolemia    Restless legs    Sleep apnea    wears cpap    Vitamin D deficiency    Past Surgical History:  Procedure Laterality Date   COLONOSCOPY     DG BARIUM ENEMA ([facility] HX)     10-19-2010- turtous, elongated colon    HEMORRHOID SURGERY     TONSILLECTOMY  1960   Patient Active Problem List   Diagnosis Date Noted   Vitamin D deficiency 03/30/2020   Pure hypercholesterolemia 03/30/2020   Prediabetes 03/30/2020   Occlusion and stenosis of bilateral carotid arteries 03/30/2020   Obstructive sleep apnea syndrome 03/30/2020   Aortic cusp regurgitation 03/30/2020   Restless legs syndrome 03/30/2020   Moderate recurrent major depression (HCC) 03/30/2020   Chronic pain 03/30/2020   OCD (obsessive compulsive disorder)  10/15/2017   Family history of ovarian cancer 04/19/2011   Family history of breast cancer 04/19/2011    ONSET DATE: 07/12/2023   REFERRING DIAG: G20.C (ICD-10-CM) - Primary parkinsonism (HCC)   THERAPY DIAG:  Unsteadiness on feet  Other abnormalities of gait and mobility  Muscle weakness (generalized)  Abnormal posture  Rationale for Evaluation and Treatment: Rehabilitation  SUBJECTIVE:  SUBJECTIVE STATEMENT: Pt presents to PT treatment session using rollator. Walked 0.75 miles at 11AM with the rollator - wife noted he took larger steps. Got his rollator and brought it in today. Pt states he's felt more steady on his feet. No falls / close calls since last session.   Pt accompanied by: Eric [name]   PERTINENT HISTORY: PMH: Parkinsonism (Possibly due to exposure to antipsychotic medication), tardive dyskinesias, restless leg syndrome, CAD, pre-diabetes, obesity, OCD, major depression,HLD  He was not exposed to anything for very long, but wife reports that symptoms have really been developing over the last 5 to 6 weeks. He was on Abilify  for a very short time in February and March and then was on Vraylar  from March until June. The symptoms developed around that time. He is off of the Vraylar  now (just went off of it)   Per Dr. Evonnie regarding abnormal brain scan:  It does appear that there is heavy basal ganglia calcification.  We can certainly confirm this with CT.  While Fahrs disease is on the differential, the timeline for his symptoms really correlates more with the timeline for when he was placed on antipsychotic medication.   PAIN:  Are you having pain? No    PRECAUTIONS: Fall  FALLS: Has patient fallen in last 6 months? No and almost falls grabbing the wall   LIVING ENVIRONMENT: Lives  with: lives with their spouse Lives in: House/apartment Stairs: Yes: Internal: 12 steps; on left going up and External: 4 and 5 steps; none Has following equipment at home: Single point cane and Grab bars  PLOF: Independent and Leisure: doesn't have any hobbies except cleaning up the house  PATIENT GOALS: Wants to get back to his regular gait and his regular walk so he can go on trips   OBJECTIVE:  Note: Objective measures were completed at Evaluation unless otherwise noted.  DIAGNOSTIC FINDINGS: MRI brain: IMPRESSION: 1. No acute intracranial abnormality. 2. 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 Head CT may be helpful for further assessment of the degree of mineralization. 3. Findings of chronic small vessel ischemia.  COGNITION: Overall cognitive status: Within functional limits for tasks assessed   SENSATION: WFL  COORDINATION: Heel to shin: some bradykinesia RLE RAM: dysmetric BUE     POSTURE: rounded shoulders, forward head, and posterior pelvic tilt   LOWER EXTREMITY MMT:    MMT Right Eval Left Eval  Hip flexion 3- 4  Hip extension    Hip abduction    Hip adduction    Hip internal rotation    Hip external rotation    Knee flexion 4 5  Knee extension 4 4  Ankle dorsiflexion 5 5  Ankle plantarflexion    Ankle inversion    Ankle eversion    (Blank rows = not tested)  BED MOBILITY:   Pt reports some difficulties getting under the sheets    TRANSFERS: Sit to stand: Modified independence  Assistive device utilized: None     Stand to sit: Modified independence  Assistive device utilized: None     Pt reports difficulties getting up from lower surfaces or in and out of the car.   GAIT: Findings: Gait Characteristics: decreased arm swing- Right, decreased arm swing- Left, decreased step length- Right, decreased step length- Left,  decreased stride length, decreased hip/knee flexion- Right, decreased hip/knee flexion- Left, decreased ankle dorsiflexion- Right, decreased ankle dorsiflexion- Left, Right foot flat, Left foot  flat, shuffling, decreased trunk rotation, trunk flexed, and narrow BOS, Distance walked: Clinic distances, Assistive device utilized:Single point cane and None, Level of assistance: CGA, and Comments: Pt with very shuffled steps. When pt gets single HHA from therapist for a couple steps, pt able to demo improved step length   FUNCTIONAL TESTS:  5 times sit to stand: 15.6 seconds with no UE support, incr forward flexed posture in standing  Timed up and go (TUG): 27.4 seconds with no AD, CGA with en bloc turning 10 meter walk test: 62 seconds with no AD and CGA = .52 ft/sec                                                                                                                                TREATMENT DATE: 08/09/23  Patient Education / Self-Care: Vitals:   08/09/23 1243  BP: 121/80  Pulse: 89   SPT and PT made appropriate rollator adjustments to fit the screws properly 4x STS practicing proper sequencing and hand placement to promote safe use the rollator safely and mitigate risk for future falls Pt required less verbal cues with increased reps with good carryover   Therapeutic Activity:  SciFit with BUE/BLE Multi-peaks at gear 5.0 > 6.0 for 8 minutes to work on neural priming, incr amplitude of stepping, reciprocal movement patterns RPE: 6/10 2x unidirectional>multi-directional/cross-body foot taps using 4 different colored dots on the stairs (2 dots on 1st step and 2 dots on 2nd step) to facilitate increased hip/knee flexion, and DF - 20 reps each foot - SBA RPE: 6-7/10 Pt required intermittent 2 finger support>no UE support to maintain balance PT administered ~2 minute seated rest break and provided pt water in between sets Pt challenged with attaining/maintaining wide BoS, required mod  verbal cues and was able correct with decent carryover  Pt challenged with bilat foot clearance, required mod-max verbal cues and was able to correct with decent carryover   PATIENT EDUCATION: Education details: see above, update HEP as needed Person educated: Patient and Spouse Education method: Medical illustrator Education comprehension: verbalized understanding, returned demonstration, verbal cues required, and needs further education  HOME EXERCISE PROGRAM: Standing PWR Moves   GOALS: Goals reviewed with patient? Yes  SHORT TERM GOALS: Target date: 08/13/2023   1. Pt will be independent with initial HEP with gait, balance, strength in order to build upon functional gains made in therapy. Baseline: Goal status: INITIAL  2.  Pt will improve TUG time to 22 seconds or less with no AD vs. LRAD in order to demo decrease fall risk. Baseline: 27.4 seconds with no AD, CGA Goal status: INITIAL  3.  Pt will improve gait speed with LRAD to at least 1.3 ft/sec in order to demo decr fall risk/improved household mobility.  Baseline: 62 seconds with no AD = .52 ft/sec and CGA Goal status: INITIAL  4.  Pt will ambulate at least 230' with supervision with LRAD over level  indoor surfaces in order to demo improved household mobility.  Baseline: pt ambulates with no AD with CGA  Goal status: INITIAL   LONG TERM GOALS: Target date: 09/03/2023   Pt will be independent with final HEP with gait, balance, strength in order to build upon functional gains made in therapy. Baseline:  Goal status: INITIAL  2.  Pt will improve TUG time to 17 seconds or less with no AD vs. LRAD in order to demo decrease fall risk. Baseline: 27.4 seconds with no AD, CGA Goal status: INITIAL  3.  Pt will improve 5x sit<>stand to less than or equal to 13 sec to demonstrate improved functional strength and transfer efficiency.  Baseline: 15.6 seconds with no UE support, incr forward flexed posture in  standing Goal status: INITIAL  4.  Pt will improve gait speed with LRAD to at least 1.8 ft/sec in order to demo decr fall risk  Baseline: 62 seconds with no AD = .52 ft/sec and CGA Goal status: INITIAL  5.  Pt will ambulate at least 500' with supervision with LRAD over outdoor unlevel surfaces in order to demo improved community mobility.  Baseline:  Goal status: INITIAL    ASSESSMENT:  CLINICAL IMPRESSION: Pt was seen for skilled PT treatment session focused on personal rollator adjustments for proper fitting and therapeutic activities. Pt put forth great effort during today's session and had no increase in pain throughout the entirety of the session. Pt highly benefits from PT instructions and demonstrations prior to engaging in various tasks. Foot taps targeted increased hip/knee flexion, and DF with continued success as the pt required less support to maintain balance. Pt continues to exhibit decreased step/stride length, decreased bilat hip/knee flexion, and decreased bilat DF although demonstrated slight improvements since last session. Pt had fewer errors and reps increased but then regressed d/t bilat muscular weakness and lack of BLE force production. Continue POC.   OBJECTIVE IMPAIRMENTS: Abnormal gait, decreased activity tolerance, decreased balance, decreased coordination, decreased knowledge of condition, decreased knowledge of use of DME, decreased mobility, difficulty walking, decreased strength, decreased safety awareness, impaired flexibility, and postural dysfunction.   ACTIVITY LIMITATIONS: transfers, bed mobility, and locomotion level  PARTICIPATION LIMITATIONS: shopping, community activity, and yard work  PERSONAL FACTORS: Age, Behavior pattern, Past/current experiences, Time since onset of injury/illness/exacerbation, and 3+ comorbidities: arkinsonism (Possibly due to exposure to antipsychotic medication), tardive dyskinesias, restless leg syndrome, CAD, pre-diabetes,  obesity, OCD, major depression,HLD are also affecting patient's functional outcome.   REHAB POTENTIAL: Good  CLINICAL DECISION MAKING: Evolving/moderate complexity  EVALUATION COMPLEXITY: Moderate  PLAN:  PT FREQUENCY: 2x/week  PT DURATION: 8 weeks  PLANNED INTERVENTIONS: 97164- PT Re-evaluation, 97110-Therapeutic exercises, 97530- Therapeutic activity, 97112- Neuromuscular re-education, 97535- Self Care, 02859- Manual therapy, [phone]- Gait training, Patient/Family education, Balance training, Stair training, and DME instructions  PLAN FOR NEXT SESSION: warm up on SciFit, trial out boxing? review standing PWR moves as needed, work on incr stride length, foot clearance tasks; balance tasks working on larger amplitude movements posture and trunk rotation   [facility], Student-PT,DPT 08/09/2023, 4:02 PM

## 2023-08-13 ENCOUNTER — Encounter: Payer: Self-pay | Admitting: Psychiatry

## 2023-08-13 ENCOUNTER — Ambulatory Visit

## 2023-08-13 ENCOUNTER — Encounter: Admitting: Psychiatry

## 2023-08-13 ENCOUNTER — Ambulatory Visit (INDEPENDENT_AMBULATORY_CARE_PROVIDER_SITE_OTHER): Admitting: Behavioral Health

## 2023-08-13 ENCOUNTER — Ambulatory Visit: Admitting: Psychiatry

## 2023-08-13 VITALS — BP 131/74 | HR 66

## 2023-08-13 DIAGNOSIS — F339 Major depressive disorder, recurrent, unspecified: Secondary | ICD-10-CM

## 2023-08-13 NOTE — Progress Notes (Signed)
 Eric [name] [id number] [birth year] 73 y.o.  Subjective:   Patient ID:  Eric [name] is a 73 y.o. (DOB 1950-07-25) male.  Chief Complaint: No chief complaint on file.   HPI [name] presents to the office today for follow-up of  treatment resistant depression (TRD).   Spravato treatment    Patient was administered Spravato 56 mg intranasally today. Patient was observed by provider throughout Spravato treatment. The patient experienced the typical dissociation which gradually resolved over the 2-hour period of observation. There were no complications. Specifically, the patient did not have any untoward side effects - feeling disconnected from themself, their thoughts, feelings and things around them, dizziness, nausea, feeling sleepy, decreased feeling of sensitivity (numbness) spinning sensation, feeling anxious, lack of energy, increased blood pressure, feeling happy or very excited, or headache. Blood pressures remained within normal ranges at the 40-minute and 2-hour follow-up intervals. By the time the 2-hour observation period was met the patient was alert and oriented and able to exit without assistance. Patient willing to continue Spravato administration for the treatment of resistant depression.    Reports Depression 4/10 today. Feels like depression is starting to improve. Pt is still cognitively impaired at the 2 hour mark after reducing dose of Sprivato.  Wife is her to provide transportation.  No question or concerns       ECT-MADRS    Flowsheet Row Office Visit from 06/18/2023 in [facility] Office Visit from 04/16/2023 in [facility]  MADRS Total Score 37 36   PHQ2-9    Flowsheet Row Office Visit from 03/15/2020 in [facility] Total Score 3  PHQ-9 Total Score 9     Review of Systems:  Review of Systems  Constitutional: Negative.    Allergic/Immunologic: Negative.   Neurological: Negative.   Psychiatric/Behavioral:  Positive for dysphoric mood.     Medications: I have reviewed the patient's current medications.  Current Outpatient Medications  Medication Sig Dispense Refill   ALPRAZolam  (XANAX ) 0.25 MG tablet TAKE 1 TABLET (0.25 MG TOTAL) BY MOUTH 2 (TWO) TIMES DAILY AS NEEDED FOR ANXIETY OR SLEEP. 30 tablet 1   aspirin 81 MG tablet Take 81 mg by mouth daily.     Cholecalciferol (VITAMIN D-3) 5000 units TABS Take 5,000 Units by mouth daily.      DULoxetine  (CYMBALTA ) 30 MG capsule Take 3 capsules (90 mg total) by mouth daily. 270 capsule 0   methylphenidate  36 MG PO CR tablet Take 1 tablet (36 mg total) by mouth daily. 30 tablet 0   naltrexone  (DEPADE) 50 MG tablet Take 0.5 tablets (25 mg total) by mouth daily. 45 tablet 1   pregabalin  (LYRICA ) 50 MG capsule Take 1 capsule (50 mg total) by mouth at bedtime. 90 capsule 0   Semaglutide ,0.25 or 0.5MG /DOS, (OZEMPIC , 0.25 OR 0.5 MG/DOSE,) 2 MG/1.5ML SOPN Inject 2.4 mg into the skin once a week.     simvastatin (ZOCOR) 20 MG tablet Take 20 mg by mouth at bedtime.     ZEPBOUND  10 MG/0.5ML Pen Inject 10 mg into the skin once a week.     No current facility-administered medications for this visit.    Medication Side Effects: None  Allergies:  Allergies  Allergen Reactions   E.E.S. [Erythromycin] Hives   Macrolides And Ketolides Other (See Comments)    EES    Rosuvastatin     Other reaction(s): cramps    Past Medical History:  Diagnosis Date   Allergy    Anemia    iron- pt denies    Anxiety    Aortic cusp regurgitation    Carotid artery occlusion    Constipation    Coronary artery stenosis    Hyperlipidemia    Lactose intolerance    Major depression, recurrent, chronic (HCC)    Obesity    OCD (obsessive compulsive disorder)    OSA (obstructive sleep apnea)    Other chronic pain    Periodic limb movement disorder    Periodic limb movements of sleep     Prediabetes    Pure hypercholesterolemia    Restless legs    Sleep apnea    wears cpap    Vitamin D deficiency     Past Medical History, Surgical history, Social history, and Family history were reviewed and updated as appropriate.   Please see review of systems for further details on the patient's review from today.   Objective:   Physical Exam:  There were no vitals taken for this visit.  Physical Exam Constitutional:      General: He is not in acute distress.    Appearance: Normal appearance.  Neurological:     Mental Status: He is alert and oriented to person, place, and time.     Gait: Gait normal.  Psychiatric:        Attention and Perception: Attention and perception normal. He does not perceive auditory or visual hallucinations.        Mood and Affect: Mood and affect normal. Mood is not anxious or depressed. Affect is not labile.        Speech: Speech normal.        Behavior: Behavior normal. Behavior is cooperative.        Thought Content: Thought content normal.        Cognition and Memory: Cognition and memory normal.        Judgment: Judgment normal.     Lab Review:  No results found for: NA, K, CL, CO2, GLUCOSE, BUN, CREATININE, CALCIUM, PROT, ALBUMIN, AST, ALT, ALKPHOS, BILITOT, GFRNONAA, GFRAA  No results found for: WBC, RBC, HGB, HCT, PLT, MCV, MCH, MCHC, RDW, LYMPHSABS, MONOABS, EOSABS, BASOSABS  No results found for: POCLITH, LITHIUM    No results found for: PHENYTOIN, PHENOBARB, VALPROATE, CBMZ   .res Assessment: Plan:    Recommendations:   Pt is very lethargic at 2 hour mark post session. Needs assistance walking.    Continue Spravato treatment. Pt did not have any questions or concerns today.  Will continue to f/u with Lorene Macintosh MD for medication management.      Redell A. Joani Cosma, NP         Diagnoses and all orders for this visit:  Recurrent major depression  resistant to treatment [facility])     Please see After Visit Summary for patient specific instructions.  Future Appointments  Date Time Provider Department Center  08/14/2023 11:00 AM Annett Sheffield SAILOR, [place] [facility]  08/16/2023 12:30 PM Annett Sheffield SAILOR, PT [facility]  08/19/2023 11:00 AM Annett Sheffield SAILOR, PT [facility]  08/21/2023  8:00 AM Annett Sheffield SAILOR, PT [facility]  08/28/2023 12:30 PM Plaster, Marlon BRAVO, PT [facility]  08/30/2023 12:30 PM Plaster, Marlon BRAVO, PT [facility]  09/04/2023 12:30 PM Annett Sheffield SAILOR, PT [facility]  09/06/2023 12:30 PM Annett Sheffield SAILOR, PT [facility]  09/17/2023  1:00 PM Cottle, Lorene KANDICE Raddle., MD CP-[facility] None  10/15/2023  1:30 PM  Geoffry Lorene KANDICE Mickey., MD CP-CP None  01/30/2024  3:00 PM Tat, Asberry RAMAN, DO [facility] None    No orders of the defined types were placed in this encounter.   -------------------------------

## 2023-08-13 NOTE — Progress Notes (Signed)
 NURSES NOTE:         Pt arrived for his 14 th Spravato Treatment for treatment resistant depression, the starting dose was 56 mg (2 of the 28 mg nasal sprays) and he tolerated it well with no side effects or complaints. Pt reports no issues after his last treatment so he will continue 84 mg which is maintenance dose and he will continue as long as tolerating it well. Damin is a patient of Dr. Calhoun so he will follow his care throughout treatments and follow ups. Pt's Spravato is a medical authorization through buy and bill.  Spravato medication is stored at treatment center per REMS/FDA guidelines. The medication is required to be locked behind two doors per REMS/FDA protocol. Medication is also disposed of properly after each use per regulations. All documentation for REMS is completed and submitted per FDA/REMS requirements.          Began taking patient's vital signs at 9:10 AM 142/86, pulse 68, SpO2 95%. Instructed patient to blow his nose if needed then recline back to a 45 degree angle. Gave patient first dose 28 mg nasal spray, administered in each nostril as directed and observed by nurse, waited 5 more minutes for the second and third doses. After all doses given pt did not complain of any nausea/vomiting. Pt was a bit disoriented when checking on him, he said he feels off and can't figure out his phone or what time it is. I did inform him of the time everytime he asked. He is very confused and asked me to keep checking on him. Assessed his 40 minute vitals, 9:50 AM, 141/95, pulse 66, SpO2 94%. Informed [name], NP as well as Dr. Geoffry who is out of the office sick today. Explained he would be monitored for a total time of 120 minutes. Discharge vitals were taken at 11:02 AM 131/74, P 66, SpO2 94%. [name], NP came to visit with patient once his thoughts were clearer to discuss how treatment went. Recommend he go home and sleep or just relax on the couch. No driving, no intense  activities. Verbalized understanding. Pt. will be coming back Thursday to discuss further treatments with Dr. Geoffry. Will discuss with Dr. Geoffry that pt may need to decrease his dose to 56 mg next time. Pt did not have a good treatment today.  Nurse was with pt a total of 60 minutes for clinical assessment. Pt was able to use his rolling walker on the way down to the vehicle, his gait is improving since starting therapy twice a week. Instructed to call with any issues.     LOT [id number] EXP JAN 2028

## 2023-08-13 NOTE — Progress Notes (Signed)
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 sleep. Plan  no changes  01/21/23 appt noted: Worrying too much bc OCD.  Trying to decide about how to deal with a trigger lately.  OCD driving me crazy wanting to make things perfect.   Other than the trigger had a nice time since here.  GS here for 5 days at 73 yo.  Enjoyed that.   Taking semaglutide   down to 250# from 283#. Card at [facility] says he does not have Aortic aneurysm.  Measuring is OK.  Will FU with MRI in June.   Some diarrhea lately. Cannot stop worrying.  Triggered by the plumbing issue. Meds as above.  No SE of sig.   Plan:  switched to duloxetine  to 90 mg daily.  Off Lexapro .  Others as noted. Lyrica  100 mg HS. Has held Ritalin  10 TID, can resume as long as SBP below 140 per card.  none needed ropinirole  3 mg pm.  No risperidone .  02/18/23 appt noted:  with Eric [name] Meds: as above. Taking Ritalin  30% of night.  Prn Xanax  rarely if gets off sleep cycle. No SE.   Terribly dep but not as bad as in the past. Taking Ritalin  appears to help significantly and started doing that.  Tend to stay in bed till noon but pattern of up late.   OCD about the same with some avoidance of detail work.   Afraid I'll throw away something I need.   She doesn't know whether Ritalin  helps No sig RLS and wife agrees. Thinks of deceased B at the holidays but worries over son Deward too.   Plan: Increase duloxetine  to 120 mg daily.  Off Lexapro .  Others as noted. Lyrica  100 mg HS.  resumed Ritalin  30 AM with some benefit. no risperidone .  03/19/23 appt noted: Psych med:  duloxetine  120, Ritalin  20 am  missing doses, Lyrica  100 HS, no risperidone , no ropinirole .   No improvement in depression since increase dose duloxetine .  More dep than usual.  Worrying about everything.  Including taxes.  All I can see is a black hole.  Making him feel negative about everything.   Keeping him from doing things.   OCD is not much different from when on Lexapro .  Wife agrees dep and distracted. Plan: resumed Ritalin  30 AM with some benefit. Resume Abilify  2 mg AM for dep.  04/16/23 appt noted:  wife here Psych med:  duloxetine  120, resumed Concerta  36 mg AM, Lyrica  100 HS, Abilify  2, no ropinirole .   Wife noted he seemed manic yesterday.  Invited window estimate against wife's will and without her input.   No mania noted until yesterday. He didn't feel manic yesterday.   He's noticed no change with Abilify  and still feels flat and a little low.  From MPH energy and mood a little better.  Sleeps until 10-11 am about 12 hours. And asleep that whole time. Wife says he doesn't eat much bc he's not awake much. Trouble with hygiene.   Plan: Meds: continue duloxetine  to 120 mg daily.  Off Lexapro .  Lyrica  100 mg HS.  resumed Ritalin  30 AM with some benefit. DC Abiilify Vraylar  1.5 mg every other day Spravato disc in detail.  He wants to pursue if not better with Vraylar .  06/18/23 appt noted: with W Med:  duloxetine  120, resumed Concerta  36 mg AM, Lyrica  100 HS, no ropinirole .   Vraylar  1.5 every other day, no benefit Spravato denied by insurance but is being appealed. More trouble walking and shorter gait.  Neuro  in [place] appt in Sept.   Looking to get in in [place].  Can't be much more dep than I am.   OCD residual when has to make a decision.   ED is more of a px. Reduced voice family. Plan : Spravato  06/25/23 appt noted: with W Med:  duloxetine  120, Concerta  36 mg AM, Lyrica  100 HS, no ropinirole .   Vraylar  1.5 daily No SE Received Spravato 56 mg today.  Experienced very mild dissociation and no sig HA, N.  But some dizziness.  Resolved by end of 2 hours observation.  Able to leave office without assistance.  Ongoing dep without change.  Slow, reduced cognition, anhedonia, forgetful, low motivation. No other concerns with meds.   06/27/23 appt noted:  Med: Med:  duloxetine  120, Concerta  36 mg AM, Lyrica  100 HS, no ropinirole .   Stopped Vraylar  1.5 daily No SE Received Spravato 84 mg todayfor the first time..  Experienced very mild dissociation and no sig HA, N.  But some dizziness.  Resolved by end of 2 hours observation.  Able to leave office without assistance.  Ongoing dep without change.  Slow, reduced cognition, anhedonia, forgetful, low motivation. No other concerns with meds.   07/02/23 appt oted: Med: Med:  duloxetine  90, Concerta  36 mg AM, Lyrica  100 HS, no ropinirole .   Stopped Vraylar  1.5 daily No SE Received Spravato 84 mg today.  Experienced very mild dissociation and no sig HA, N.  But some dizziness.  Resolved by end of 2 hours observation.  Mildly drowsy at end of session. BC of gait px he needed to use WC to leave the office.  Per wife gait gradually worsened over a couple of years but accelerated recently in 3 mos or so.  Short gait.  Not due to pain.  Pending neuro appt.  MRI last week not read yet. No problems with meds. Wife noticed he's shown more initiative at home since [place] ex doing crossword puzzles.  More engaged.  He feels lighter already with it.  07/15/23 appt noted:  Med: Med:  duloxetine  90, Concerta  36 mg AM, Lyrica  100 HS, no ropinirole .  No SE Received Spravato  84 mg today.  Experienced very mild dissociation and no sig HA, N.  But some dizziness.  Resolved by end of 2 hours observation.  Mildly drowsy at end of session. BC of gait px he needed to use WC to leave the office. Seen with W.   W noted clear benefit Spravato with stronger voice, spontaneous chores he wasn't doing.  More engaged in coverstation.  Pt agrees. Disc concerns over gait px and his neuro visit and MRI scan suggesting Fahr's DZ.  He'll discuss further with DR. Tat tomorrow.  07/18/23 appt noted:  Med: Med:  duloxetine  90, Concerta  36 mg AM, Lyrica  100 HS, no ropinirole .  No SE Received Spravato 84 mg today.  Experienced very mild dissociation and no sig HA, N.  But some dizziness.  Resolved by end of 2 hours observation.  Mildly drowsy at end of session. BC of gait px he needed to use WC to leave the office. No med concerns.  Seen with W.    Both agree dep is better .  More affective range.  More socially engaged.  Quicker thought.  More active .  Less down and less negative.  07/23/23 appt noted: Med: Med:  duloxetine  90, Concerta  36 mg AM, Lyrica  100 HS, no ropinirole .  No SE Received Spravato 84 mg today.  Experienced very mild dissociation and no sig HA, N.  But some dizziness.  Resolved by end of 2 hours observation.  Mildly drowsy at end of session. BC of gait px he needed to use WC to leave the office. No med concerns.  Seen with W.    Dep is still improved.  But is dealing with poor gait, weak and slow.  Will start neurorehab today.   Anxiety re: OCD manageable. Thought and affect quicker and more responsive .  Humor returned.  07/25/23 appt:  Med: Med:  duloxetine  90, Concerta  36 mg AM, Lyrica  100 HS, no ropinirole .  No SE Received Spravato 84 mg today.  Experienced very mild dissociation and no sig HA, N.  But some dizziness.  Resolved by end of 2 hours observation.  Mildly drowsy at end of session. BC of gait px he needed to use WC to leave the office. No med  concerns.  Seen with W.    Overall mood still improving but not 100%.  Starting neurorehab for weakness.  No new med concerns.  Satisfied with meds.  07/30/23 appt noted:  Med: Med:  duloxetine  90, Concerta  36 mg AM, Lyrica  100 HS, no ropinirole .  No SE Received Spravato 84 mg today.  Experienced very mild dissociation and no sig HA, N.  But some dizziness.  Resolved by end of 2 hours observation.  Mildly drowsy at end of session. BC of gait px he needed to use WC to leave the office. No med concerns.  Seen with W.    Both agree mood is improving and activity and interest.  Not 100% but limited by mobility and starting PT.    ECT-MADRS    Flowsheet Row Office Visit from 06/18/2023 in [facility] Office Visit from 04/16/2023 in [facility]  MADRS Total Score 37 36   PHQ2-9    Flowsheet Row Office Visit from 03/15/2020 in [facility] Total Score 3  PHQ-9 Total Score 9     B schizophrenic SUI. After M's death. PCP Cecil Ee at [facility] at 73 years old.  Prior psychiatric medication trials include  Lexapro  20, citalopram NR, clomipramine weight gain, paroxetine, fluoxetine, Luvox, Trintellix,   Increase Lexapro  back to 20 mg January 2020. & 10/2020 bupropion ,  Auvelity NR  Abilify  10 fatigue,  Cerefolin NAC, and   Naltrexone  sexual SE Lyrica  150 tired  pramipexole,  ropinirole  Adderall XR & IR sexual SE,  Ritalin  30, Concerta  72 mg AM NR modafinil  and Nuvigil,   History [employer] OCD  Review of Systems:  Review of Systems  Constitutional:  Positive for fatigue. Negative for fever.  Cardiovascular:  Negative for chest pain and palpitations.  Genitourinary:        ED  Musculoskeletal:  Positive for arthralgias, gait problem and myalgias.  Neurological:  Positive for weakness. Negative for tremors and syncope.  Psychiatric/Behavioral:   Positive for dysphoric mood and sleep disturbance. Negative for agitation, behavioral problems, confusion, decreased concentration, hallucinations, self-injury and suicidal ideas. The patient is nervous/anxious. The patient is not hyperactive.     Medications: I have reviewed the patient's current medications.  Current Outpatient Medications  Medication Sig Dispense Refill   ALPRAZolam  (XANAX ) 0.25 MG tablet TAKE 1 TABLET (0.25 MG TOTAL) BY MOUTH 2 (TWO) TIMES DAILY AS NEEDED FOR ANXIETY OR SLEEP. 30 tablet 1   aspirin 81 MG tablet Take 81 mg by mouth daily.     Cholecalciferol (VITAMIN D-3) 5000 units TABS Take 5,000 Units by mouth daily.      DULoxetine  (CYMBALTA ) 30 MG capsule Take 3 capsules (90 mg total) by mouth daily. 270 capsule 0   methylphenidate  36 MG PO CR tablet Take 1 tablet (36 mg total) by mouth daily. 30 tablet 0   naltrexone  (DEPADE) 50 MG tablet Take 0.5 tablets (25 mg total) by mouth daily. 45 tablet 1   pregabalin  (LYRICA ) 50 MG capsule Take 1 capsule (50 mg total) by mouth at bedtime. 90 capsule 0   simvastatin (ZOCOR) 20 MG tablet Take 20 mg by mouth at bedtime.     ZEPBOUND  10 MG/0.5ML Pen Inject 10 mg into the skin once a week.     Semaglutide ,0.25 or 0.5MG /DOS, (OZEMPIC , 0.25 OR 0.5 MG/DOSE,) 2 MG/1.5ML SOPN Inject 2.4 mg into the skin once a week.     No current facility-administered medications for this visit.    Medication Side Effects: None sexual SE are better not  All gone.  Allergies:  Allergies  Allergen Reactions   E.E.S. [Erythromycin] Hives   Macrolides And Ketolides Other (See Comments)    EES    Rosuvastatin     Other reaction(s): cramps    Past Medical History:  Diagnosis Date   Allergy    Anemia    iron- pt denies    Anxiety    Aortic cusp regurgitation    Carotid artery occlusion    Constipation    Coronary artery stenosis    Hyperlipidemia    Lactose intolerance    Major depression, recurrent, chronic (HCC)    Obesity    OCD  (obsessive compulsive disorder)    OSA (obstructive sleep apnea)    Other  chronic pain    Periodic limb movement disorder    Periodic limb movements of sleep    Prediabetes    Pure hypercholesterolemia    Restless legs    Sleep apnea    wears cpap    Vitamin D deficiency     Family History  Problem Relation Age of Onset   Cancer Mother        breast and ovarian   Anxiety disorder Mother    Breast cancer Mother    Ovarian cancer Mother    Depression Father        bi-polar   Hyperlipidemia Father    Heart disease Father    Sudden death Father    Bipolar disorder Father    Sleep apnea Father    Obesity Father    Depression Son    Colon cancer Neg Hx    Colon polyps Neg Hx    Esophageal cancer Neg Hx    Rectal cancer Neg Hx    Stomach cancer Neg Hx     Social History   Socioeconomic History   Marital status: Married    Spouse name: Not on file   Number of children: Not on file   Years of education: Not on file   Highest education level: Not on file  Occupational History   Occupation: retired Pensions consultant  Tobacco Use   Smoking status: Never   Smokeless tobacco: Never  Substance and Sexual Activity   Alcohol use: Yes    Comment: occasionally    Drug use: No   Sexual activity: Not on file  Other Topics Concern   Not on file  Social History Narrative   Not on file   Social Drivers of Health   Financial Resource Strain: Not on file  Food Insecurity: No Food Insecurity (01/25/2022)   Received from [facility] visits prior to 03/24/2022[facility]   Hunger Vital Sign    Worried About Running Out of Food in the Last Year: Never true    Ran Out of Food in the Last Year: Never true  Transportation Needs: No Transportation Needs (01/25/2022)   Received from [employer], [facility] visits prior to 03/24/2022.   PRAPARE - Administrator, Civil Service (Medical): No    Lack of Transportation (Non-Medical): No   Physical Activity: Not on file  Stress: Not on file  Social Connections: Not on file  Intimate Partner Violence: Not on file    Past Medical History, Surgical history, Social history, and Family history were reviewed and updated as appropriate.   Please see review of systems for further details on the patient's review from today.   Objective:   Physical Exam:  There were no vitals taken for this visit.  Physical Exam Constitutional:      General: He is not in acute distress.    Appearance: He is obese.  Musculoskeletal:        General: No deformity.  Neurological:     Mental Status: He is alert and oriented to person, place, and time.     Cranial Nerves: No dysarthria.     Coordination: Coordination abnormal.     Comments: Shortened gait.  No sig tremor.  Slow.  Psychiatric:        Attention and Perception: Attention and perception normal. He does not perceive auditory or visual hallucinations.        Mood and Affect: Mood is anxious and depressed. Affect is  not labile, blunt, angry or inappropriate.        Speech: Speech normal.        Behavior: Behavior is slowed. Behavior is cooperative.        Thought Content: Thought content normal. Thought content is not paranoid or delusional. Thought content does not include homicidal or suicidal ideation. Thought content does not include suicidal plan.        Cognition and Memory: Cognition and memory normal.        Judgment: Judgment normal.     Comments: Insight intact Depression 60% better OCD is about the same and not the main problem More expressive and more normal affect.    November 06, 2018: [place] Cog test in office within normal limits MMSE 28/30. Animal fluency 17 . (borderline) Taken as a whole, no indication to pursue neuropsychological testing.  Mini-Mental status exam 28/30 on 10/27/20.  No evidence of dementia.  Lab Review:   Vitamin D level acceptable at 54.5.   Normal B12 and folate and TSH in last  couple of years..  Echocardiogram is stable re: AVR over the last 8 years and not likely the cause of lethargy.   06/28/23 MRI head:  IMPRESSION: 1. No acute intracranial abnormality. 2. 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 Head CT may be helpful for further assessment of the degree of mineralization. 3. Findings of chronic small vessel ischemia.  SABRAres Assessment: Plan:    Eric [name] was seen today for follow-up, sleeping problem, depression and anxiety.  Diagnoses and all orders for this visit:  Recurrent major depression resistant to treatment [facility])  Mixed obsessional thoughts and acts  Attention deficit hyperactivity disorder (ADHD), predominantly inattentive type  Hypersomnia with sleep apnea  Obstructive sleep apnea  Mild cognitive impairment  Restless legs syndrome  Low vitamin D level    Eric [name] has a long history of depression and OCD.  Major depression with fatigue, cognitive and physical slowness, anhedonia is much worse.  Started Spravato  and improving.  Was better energy with duloxetine  90 vs Lexapro  so increased to 120 mg daily DT worsening depression.    But it was not better. So reduced it back to 90 mg daily. Dep is much worse, in fact, his worst depression ever. pursue Spravato  He has some compulsive checking and obsessions around the house maintenance.  When travels then tends to have less OCD bc triggered less.    Patient was administered Spravato 84 mg intranasally today.  The patient experienced the typical dissociation which gradually resolved over the 2-hour period of observation.  There were no complications.  Specifically the patient did not have nausea or vomiting or headache.  Blood pressures remained within normal ranges at the 40-minute and 2-hour follow-up intervals.  By the time the 2-hour observation period was met  the patient was alert and oriented and able to exit without assistance.  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  See nursing note for further details. Emphasized need to relax with Spravato and not return calls, read, return chats, emails etc. Started Spravato 06/25/23  His OCD is persistent with checking lites, stove, etc. .  It is better at the moment DT depression being worse.  Disc CBT techniques and potential for more therapy to address. Option Dr. Marijean.  Continue recent retrial Concerta  36 mg AM He wants to use  it for depression and cognitive concerns.  Discussed potential benefits, risks, and side effects of stimulants with patient to include increased heart rate, palpitations, insomnia, increased anxiety, increased irritability, or decreased appetite.  Instructed patient to contact office if experiencing any significant tolerability issues.  Extensive discussion of sleep study and he has a copy.  Why is there virtually no N3 & REM sleep?  Is that affecting daytime alertness and fatigue?  Vs how much is related to mild OSA and PLMS?  Disc this in detail.  Wrote coreespondence with sleep doc about it.  Options for sleep & fatigue:  Difficult to assess  bc has been out of country and then sick for 3 weeks.   Focus on reduction in OSA Focus on improving deep stage sleep.  ? Rx low dose mirtazapine or alternatives Focus on leg movements (hx RLS/PLMS) continue Trial as had been suggested Lyrica  for FM type sx and may help RLS/PLMS.  Better dreaming on 100 mg HS and too sleepy on 150.  Decreased  to 100 mg HS for sleep and back pain and RLS No ropinirole  needed.   Disc SE.  Not having any.   Use LED Xanax  and try to avoid BZ daytime bc fatigue.  He doesn't use it much daythime.  Using 0..25-0.5 mg at night.  May need less with more Lyrica  at HS and try to minimize.  No hangover. We discussed the  short-term risks associated with benzodiazepines including sedation and increased fall risk among others.  Discussed long-term side effect risk including dependence, potential withdrawal symptoms, and the potential eventual dose-related risk of dementia.  But recent studies from 2020 dispute this association between benzodiazepines and dementia risk. Newer studies in 2020 do not support an association with dementia.  Stimulant partially successfully used off label to augment antidepressants for depression and have resulted in improved productivity and attention.    Previous screening of memory was not suggestive of any neuro degenerative process: Mini-Mental status exam 28/30 on 10/27/20.   Recent cognition worse as dep worsened.  Also gait is short and slow.  Pending neuro appt.  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  Plan:  no med changes.   continue duloxetine  to 120 mg daily.  Off Lexapro .  Lyrica  100 mg HS.  resumed MPH ER 36 mg AM   Spravato disc in detail.  He wants to continue.  He and wife notice he's better.  Administer twice weekly until plateau with max benefit.  He's still improving.  Will administer at 84 mg .   Follow-up   Lorene Macintosh MD, DFAPA.  Please see After Visit Summary for patient specific instructions.  Future Appointments  Date Time Provider Department Center  08/14/2023 11:00 AM Annett Sheffield SAILOR, [place] [facility]  08/15/2023  9:00 AM Cottle, Lorene KANDICE Raddle., MD [facility]-CP None  08/15/2023  9:00 AM CP-NURSE CP-CP None  08/16/2023 12:30 PM Annett Sheffield SAILOR, PT [facility]  08/19/2023 11:00 AM Annett Sheffield SAILOR, PT [facility]  08/21/2023  8:00 AM Annett Sheffield SAILOR, PT [facility]  08/28/2023 12:30 PM Plaster, Marlon BRAVO, PT [facility]  08/30/2023 12:30 PM Plaster, Marlon BRAVO, PT [facility]  09/04/2023 12:30 PM Annett Sheffield SAILOR, PT [facility]  09/06/2023 12:30 PM Annett Sheffield SAILOR, PT [facility]  09/17/2023  1:00 PM Cottle, Lorene KANDICE Raddle., MD CP-CP  None  10/15/2023  1:30 PM Cottle, Lorene KANDICE Raddle., MD CP-CP None  01/30/2024  3:00 PM Tat, Asberry RAMAN, DO [id number]  None     No orders of the defined types were placed in this encounter.      -------------------------------

## 2023-08-14 ENCOUNTER — Ambulatory Visit: Admitting: Physical Therapy

## 2023-08-14 ENCOUNTER — Encounter: Payer: Self-pay | Admitting: Physical Therapy

## 2023-08-14 VITALS — BP 132/69 | HR 80

## 2023-08-14 DIAGNOSIS — M6281 Muscle weakness (generalized): Secondary | ICD-10-CM

## 2023-08-14 DIAGNOSIS — R2681 Unsteadiness on feet: Secondary | ICD-10-CM | POA: Diagnosis not present

## 2023-08-14 DIAGNOSIS — R293 Abnormal posture: Secondary | ICD-10-CM

## 2023-08-14 DIAGNOSIS — R2689 Other abnormalities of gait and mobility: Secondary | ICD-10-CM

## 2023-08-14 DIAGNOSIS — G20C Parkinsonism, unspecified: Secondary | ICD-10-CM | POA: Diagnosis not present

## 2023-08-14 NOTE — Therapy (Signed)
 OUTPATIENT PHYSICAL THERAPY NEURO TREATMENT   Patient Name: Eric [name] MRN: [id number] DOB:[birth year], 73 y.o., male Today's Date: 08/14/2023   PCP: Regino Slater, MD   REFERRING PROVIDER: Evonnie Asberry RAMAN, DO    END OF SESSION:  PT End of Session - 08/14/23 1109     Visit Number 6    Number of Visits 13    Date for PT Re-Evaluation 09/21/23    Authorization Type [employer]    PT Start Time 1106   pt late to session   PT Stop Time 1144    PT Time Calculation (min) 38 min    Equipment Utilized During Treatment Gait belt    Activity Tolerance Patient tolerated treatment well    Behavior During Therapy WFL for tasks assessed/performed          Past Medical History:  Diagnosis Date   Allergy    Anemia    iron- pt denies    Anxiety    Aortic cusp regurgitation    Carotid artery occlusion    Constipation    Coronary artery stenosis    Hyperlipidemia    Lactose intolerance    Major depression, recurrent, chronic (HCC)    Obesity    OCD (obsessive compulsive disorder)    OSA (obstructive sleep apnea)    Other chronic pain    Periodic limb movement disorder    Periodic limb movements of sleep    Prediabetes    Pure hypercholesterolemia    Restless legs    Sleep apnea    wears cpap    Vitamin D deficiency    Past Surgical History:  Procedure Laterality Date   COLONOSCOPY     DG BARIUM ENEMA ([facility] HX)     10-19-2010- turtous, elongated colon    HEMORRHOID SURGERY     TONSILLECTOMY  1960   Patient Active Problem List   Diagnosis Date Noted   Vitamin D deficiency 03/30/2020   Pure hypercholesterolemia 03/30/2020   Prediabetes 03/30/2020   Occlusion and stenosis of bilateral carotid arteries 03/30/2020   Obstructive sleep apnea syndrome 03/30/2020   Aortic cusp regurgitation 03/30/2020   Restless legs syndrome 03/30/2020   Moderate recurrent major depression (HCC) 03/30/2020   Chronic pain 03/30/2020   OCD (obsessive  compulsive disorder) 10/15/2017   Family history of ovarian cancer 04/19/2011   Family history of breast cancer 04/19/2011    ONSET DATE: 07/12/2023   REFERRING DIAG: G20.C (ICD-10-CM) - Primary parkinsonism (HCC)   THERAPY DIAG:  Unsteadiness on feet  Other abnormalities of gait and mobility  Muscle weakness (generalized)  Abnormal posture  Rationale for Evaluation and Treatment: Rehabilitation  SUBJECTIVE:  SUBJECTIVE STATEMENT: Pt notes his steps are better than they were. No falls. Did not get to go for a walk this morning. Pt asking if he is able to move his 8 AM appt next week.    Pt accompanied by: Self   PERTINENT HISTORY: PMH: Parkinsonism (Possibly due to exposure to antipsychotic medication), tardive dyskinesias, restless leg syndrome, CAD, pre-diabetes, obesity, OCD, major depression,HLD  He was not exposed to anything for very long, but wife reports that symptoms have really been developing over the last 5 to 6 weeks. He was on Abilify  for a very short time in February and March and then was on Vraylar  from March until June. The symptoms developed around that time. He is off of the Vraylar  now (just went off of it)   Per Dr. Evonnie regarding abnormal brain scan:  It does appear that there is heavy basal ganglia calcification.  We can certainly confirm this with CT.  While Fahrs disease is on the differential, the timeline for his symptoms really correlates more with the timeline for when he was placed on antipsychotic medication.   PAIN:  Are you having pain? No  PRECAUTIONS: Fall  FALLS: Has patient fallen in last 6 months? No and almost falls grabbing the wall   LIVING ENVIRONMENT: Lives with: lives with their spouse Lives in: House/apartment Stairs: Yes: Internal: 12 steps; on  left going up and External: 4 and 5 steps; none Has following equipment at home: Single point cane and Grab bars  PLOF: Independent and Leisure: doesn't have any hobbies except cleaning up the house  PATIENT GOALS: Wants to get back to his regular gait and his regular walk so he can go on trips   OBJECTIVE:  Note: Objective measures were completed at Evaluation unless otherwise noted.  DIAGNOSTIC FINDINGS: MRI brain: IMPRESSION: 1. No acute intracranial abnormality. 2. 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 Head CT may be helpful for further assessment of the degree of mineralization. 3. Findings of chronic small vessel ischemia.  COGNITION: Overall cognitive status: Within functional limits for tasks assessed   SENSATION: WFL  COORDINATION: Heel to shin: some bradykinesia RLE RAM: dysmetric BUE     POSTURE: rounded shoulders, forward head, and posterior pelvic tilt   LOWER EXTREMITY MMT:    MMT Right Eval Left Eval  Hip flexion 3- 4  Hip extension    Hip abduction    Hip adduction    Hip internal rotation    Hip external rotation    Knee flexion 4 5  Knee extension 4 4  Ankle dorsiflexion 5 5  Ankle plantarflexion    Ankle inversion    Ankle eversion    (Blank rows = not tested)  BED MOBILITY:   Pt reports some difficulties getting under the sheets    TRANSFERS: Sit to stand: Modified independence  Assistive device utilized: None     Stand to sit: Modified independence  Assistive device utilized: None     Pt reports difficulties getting up from lower surfaces or in and out of the car.   GAIT: Findings: Gait Characteristics: decreased arm swing- Right, decreased arm swing- Left, decreased step length- Right, decreased step length- Left, decreased stride length, decreased hip/knee flexion- Right, decreased hip/knee flexion- Left,  decreased ankle dorsiflexion- Right, decreased ankle dorsiflexion- Left, Right foot flat, Left foot flat, shuffling, decreased trunk rotation, trunk flexed, and narrow BOS, Distance walked: Clinic distances, Assistive  device utilized:Single point cane and None, Level of assistance: CGA, and Comments: Pt with very shuffled steps. When pt gets single HHA from therapist for a couple steps, pt able to demo improved step length   FUNCTIONAL TESTS:  5 times sit to stand: 15.6 seconds with no UE support, incr forward flexed posture in standing  Timed up and go (TUG): 27.4 seconds with no AD, CGA with en bloc turning 10 meter walk test: 62 seconds with no AD and CGA = .52 ft/sec                                                                                                                                TREATMENT DATE: 08/14/23  Therapeutic Activity:  Vitals:   08/14/23 1118  BP: 132/69  Pulse: 80   Vitals WNL   Pt asking if next Wednesday's appt can be moved as it is 8 AM and early for him. PT did not have any later appts. Pt to decide next Monday if he wants to keep earlier appt or just add it on at the end  Reminder cues to back up all the way to the mat table and lock rollator brakes prior to sitting   Ambulated 230' x 1 with rollator and additional clinic distances with pt with improved stride length with rollator compared to AD, still ambulates with shuffled steps, bilateral foot flat and little to no ankle DF. Needs cues throughout gait to try to perform heel strike to also help with longer strides.   Goal Assessment: TUG: 22.8 seconds with rollator SBA, 16.6 seconds with no AD SBA/CGA Gait speed: 21.7 seconds with rollator = 1.51 ft/sec   NMR: On rockerboard in A/P direction: Weight shifting 15 reps for hip/ankle strategy, pt more challenged with shifting weight anteriorly Alternating forward toe taps to 2 cones, 2 x 10 reps alternating legs Alternating heel taps to floor with cues to  hold ankle DF, 10 reps each side, noted decr ROM with R ankle DF Alternating posterior step and weight shift back onto board 15 reps each side, UE support > trying not to do UE support, pt with tendency to have more weight on heels, cues to get weight back in middle of board before taking another posterior step Pt reporting his back was bothersome after being on board, but felt better after sitting   PATIENT EDUCATION: Education details: Results of goals, continue standing PWR moves and walking at home with focus on improved stride length and foot clearance  Person educated: Patient Education method: Medical illustrator Education comprehension: verbalized understanding, returned demonstration, verbal cues required, and needs further education  HOME EXERCISE PROGRAM: Standing PWR Moves   GOALS: Goals reviewed with patient? Yes  SHORT TERM GOALS: Target date: 08/13/2023   1. Pt will be independent with initial HEP with gait, balance, strength in order to build upon functional gains made in therapy. Baseline: pt reports doing standing PWR moves  every other day Goal status: PARTIALLY MET  2.  Pt will improve TUG time to 22 seconds or less with no AD vs. LRAD in order to demo decrease fall risk. Baseline: 27.4 seconds with no AD, CGA  16.6 seconds with no AD SBA/CGA (7/23) Goal status: MET  3.  Pt will improve gait speed with LRAD to at least 1.3 ft/sec in order to demo decr fall risk/improved household mobility.  Baseline: 62 seconds with no AD = .52 ft/sec and CGA   21.7 seconds with rollator = 1.51 ft/sec Goal status: MET  4.  Pt will ambulate at least 230' with supervision with LRAD over level indoor surfaces in order to demo improved household mobility.  Baseline: pt ambulates with no AD with CGA   Ambulates 230' with supervision and rollator  Goal status: MET   LONG TERM GOALS: Target date: 09/03/2023   Pt will be independent with final HEP with gait, balance,  strength in order to build upon functional gains made in therapy. Baseline:  Goal status: INITIAL  2.  Pt will improve TUG time to 13.5 seconds or less with no AD vs. LRAD in order to demo decrease fall risk. Baseline: 27.4 seconds with no AD, CGA  16.6 seconds with no AD SBA/CGA (7/23) Goal status: REVISED  3.  Pt will improve 5x sit<>stand to less than or equal to 13 sec to demonstrate improved functional strength and transfer efficiency.  Baseline: 15.6 seconds with no UE support, incr forward flexed posture in standing Goal status: INITIAL  4.  Pt will improve gait speed with LRAD to at least 1.8 ft/sec in order to demo decr fall risk  Baseline: 62 seconds with no AD = .52 ft/sec and CGA Goal status: INITIAL  5.  Pt will ambulate at least 500' with supervision with LRAD over outdoor unlevel surfaces in order to demo improved community mobility.  Baseline:  Goal status: INITIAL    ASSESSMENT:  CLINICAL IMPRESSION: Session limited due to pt arriving late. Today's skilled session focused on assessing STGs. Pt has met 3 out of 4 STGs in regards to gait with rollator, TUG, and gait speed. Today's gait speed with rollator was 1.51 ft/sec, decr pt's risk of falls (at eval was .52 ft/sec). Pt decr TUG time to 16.6 seconds (was 27.4 seconds at eval with CGA). Remainder of session focused on balance strategies on rockerboard with focus on foot clearance and larger amplitude movements with stepping. Pt tolerated session well, will continue per POC.   OBJECTIVE IMPAIRMENTS: Abnormal gait, decreased activity tolerance, decreased balance, decreased coordination, decreased knowledge of condition, decreased knowledge of use of DME, decreased mobility, difficulty walking, decreased strength, decreased safety awareness, impaired flexibility, and postural dysfunction.   ACTIVITY LIMITATIONS: transfers, bed mobility, and locomotion level  PARTICIPATION LIMITATIONS: shopping, community activity, and  yard work  PERSONAL FACTORS: Age, Behavior pattern, Past/current experiences, Time since onset of injury/illness/exacerbation, and 3+ comorbidities: arkinsonism (Possibly due to exposure to antipsychotic medication), tardive dyskinesias, restless leg syndrome, CAD, pre-diabetes, obesity, OCD, major depression,HLD are also affecting patient's functional outcome.   REHAB POTENTIAL: Good  CLINICAL DECISION MAKING: Evolving/moderate complexity  EVALUATION COMPLEXITY: Moderate  PLAN:  PT FREQUENCY: 2x/week  PT DURATION: 8 weeks  PLANNED INTERVENTIONS: 97164- PT Re-evaluation, 97110-Therapeutic exercises, 97530- Therapeutic activity, 97112- Neuromuscular re-education, 97535- Self Care, [id number]- Manual therapy, [phone]- Gait training, Patient/Family education, Balance training, Stair training, and DME instructions  PLAN FOR NEXT SESSION:   warm up on [employer], trial out boxing?  work on incr stride length, foot clearance tasks; balance tasks working on larger amplitude movements posture and trunk rotation  Add additional balance to [employer], PT,DPT 08/14/2023, 11:45 AM

## 2023-08-15 ENCOUNTER — Ambulatory Visit (INDEPENDENT_AMBULATORY_CARE_PROVIDER_SITE_OTHER): Admitting: Psychiatry

## 2023-08-15 ENCOUNTER — Encounter: Payer: Self-pay | Admitting: Psychiatry

## 2023-08-15 ENCOUNTER — Ambulatory Visit

## 2023-08-15 VITALS — BP 149/86 | HR 70

## 2023-08-15 DIAGNOSIS — F5221 Male erectile disorder: Secondary | ICD-10-CM

## 2023-08-15 DIAGNOSIS — F900 Attention-deficit hyperactivity disorder, predominantly inattentive type: Secondary | ICD-10-CM

## 2023-08-15 DIAGNOSIS — G3184 Mild cognitive impairment, so stated: Secondary | ICD-10-CM

## 2023-08-15 DIAGNOSIS — G4733 Obstructive sleep apnea (adult) (pediatric): Secondary | ICD-10-CM

## 2023-08-15 DIAGNOSIS — G2581 Restless legs syndrome: Secondary | ICD-10-CM

## 2023-08-15 DIAGNOSIS — G4710 Hypersomnia, unspecified: Secondary | ICD-10-CM

## 2023-08-15 DIAGNOSIS — R7989 Other specified abnormal findings of blood chemistry: Secondary | ICD-10-CM

## 2023-08-15 DIAGNOSIS — F422 Mixed obsessional thoughts and acts: Secondary | ICD-10-CM

## 2023-08-15 DIAGNOSIS — F339 Major depressive disorder, recurrent, unspecified: Secondary | ICD-10-CM

## 2023-08-15 NOTE — Progress Notes (Signed)
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 appt noted: Worrying too much bc OCD.  Trying to decide about how to deal with a trigger lately.  OCD driving me crazy wanting to make things perfect.   Other than the trigger had a nice time since here.  GS here for 5 days at 73 yo.  Enjoyed that.   Taking semaglutide   down to 250# from 283#. Card at [facility] says he does not have Aortic aneurysm.  Measuring is OK.  Will FU with MRI in June.   Some diarrhea lately. Cannot stop worrying.  Triggered by the plumbing issue. Meds as above.  No SE of sig.   Plan:  switched to duloxetine  to 90 mg daily.  Off Lexapro .  Others as noted. Lyrica  100 mg HS. Has held Ritalin  10 TID, can resume as long as SBP below 140 per card.  none needed ropinirole  3 mg pm.  No risperidone .  02/18/23 appt noted:  with [name] Meds: as above. Taking Ritalin  30% of night.  Prn Xanax  rarely if gets off sleep cycle. No SE.   Terribly dep but not as bad as in the past. Taking Ritalin  appears to help significantly and started doing that.  Tend to stay in bed till noon but pattern of up late.   OCD about the same with some avoidance of detail work.   Afraid I'll throw away something I need.   She doesn't know whether Ritalin  helps No sig RLS and wife agrees. Thinks of deceased B at the holidays but worries over son Deward too.   Plan: Increase duloxetine  to 120 mg daily.  Off Lexapro .  Others as noted. Lyrica  100 mg HS.  resumed Ritalin  30 AM with some benefit. no risperidone .  03/19/23 appt noted: Psych med:  duloxetine  120, Ritalin  20 am  missing doses, Lyrica  100 HS, no risperidone , no ropinirole .   No improvement in depression since increase dose duloxetine .  More dep than usual.  Worrying about everything.  Including taxes.  All I can see is a black hole.  Making him feel negative about everything.   Keeping him from doing things.   OCD is not much different from when on Lexapro .  Wife agrees dep and distracted. Plan: resumed Ritalin  30 AM with some benefit. Resume Abilify  2 mg AM for dep.  04/16/23 appt noted:  wife here Psych med:  duloxetine  120, resumed Concerta  36 mg AM, Lyrica  100 HS, Abilify  2, no ropinirole .   Wife noted he seemed manic yesterday.  Invited window estimate against wife's will and without her input.   No mania noted until yesterday. He didn't feel manic yesterday.   He's noticed no change with Abilify  and still feels flat and a little low.  From MPH energy and mood a little better.  Sleeps until 10-11 am about 12 hours. And asleep that whole time. Wife says he doesn't eat much bc he's not awake much. Trouble with hygiene.   Plan: Meds: continue duloxetine  to 120 mg daily.  Off Lexapro .  Lyrica  100 mg HS.  resumed Ritalin  30 AM with some benefit. DC Abiilify Vraylar  1.5 mg every other day Spravato disc in detail.  He wants to pursue if not better with Vraylar .  06/18/23 appt noted: with W Med:  duloxetine  120, resumed Concerta  36 mg AM, Lyrica  100 HS, no ropinirole .   Vraylar  1.5 every other day, no benefit Spravato denied by insurance but is being appealed. More trouble walking and shorter gait.  Neuro  in [place] appt in Sept.   Looking to get in in [place].  Can't be much more dep than I am.   OCD residual when has to make a decision.   ED is more of a px. Reduced voice family. Plan : Spravato  06/25/23 appt noted: with W Med:  duloxetine  120, Concerta  36 mg AM, Lyrica  100 HS, no ropinirole .   Vraylar  1.5 daily No SE Received Spravato 56 mg today.  Experienced very mild dissociation and no sig HA, N.  But some dizziness.  Resolved by end of 2 hours observation.  Able to leave office without assistance.  Ongoing dep without change.  Slow, reduced cognition, anhedonia, forgetful, low motivation. No other concerns with meds.   06/27/23 appt noted:  Med: Med:  duloxetine  120, Concerta  36 mg AM, Lyrica  100 HS, no ropinirole .   Stopped Vraylar  1.5 daily No SE Received Spravato 84 mg todayfor the first time..  Experienced very mild dissociation and no sig HA, N.  But some dizziness.  Resolved by end of 2 hours observation.  Able to leave office without assistance.  Ongoing dep without change.  Slow, reduced cognition, anhedonia, forgetful, low motivation. No other concerns with meds.   07/02/23 appt oted: Med: Med:  duloxetine  90, Concerta  36 mg AM, Lyrica  100 HS, no ropinirole .   Stopped Vraylar  1.5 daily No SE Received Spravato 84 mg today.  Experienced very mild dissociation and no sig HA, N.  But some dizziness.  Resolved by end of 2 hours observation.  Mildly drowsy at end of session. BC of gait px he needed to use WC to leave the office.  Per wife gait gradually worsened over a couple of years but accelerated recently in 3 mos or so.  Short gait.  Not due to pain.  Pending neuro appt.  MRI last week not read yet. No problems with meds. Wife noticed he's shown more initiative at home since [place] ex doing crossword puzzles.  More engaged.  He feels lighter already with it.  07/15/23 appt noted:  Med: Med:  duloxetine  90, Concerta  36 mg AM, Lyrica  100 HS, no ropinirole .  No SE Received Spravato  84 mg today.  Experienced very mild dissociation and no sig HA, N.  But some dizziness.  Resolved by end of 2 hours observation.  Mildly drowsy at end of session. BC of gait px he needed to use WC to leave the office. Seen with W.   W noted clear benefit Spravato with stronger voice, spontaneous chores he wasn't doing.  More engaged in coverstation.  Pt agrees. Disc concerns over gait px and his neuro visit and MRI scan suggesting Fahr's DZ.  He'll discuss further with DR. Tat tomorrow.  07/18/23 appt noted:  Med: Med:  duloxetine  90, Concerta  36 mg AM, Lyrica  100 HS, no ropinirole .  No SE Received Spravato 84 mg today.  Experienced very mild dissociation and no sig HA, N.  But some dizziness.  Resolved by end of 2 hours observation.  Mildly drowsy at end of session. BC of gait px he needed to use WC to leave the office. No med concerns.  Seen with W.    Both agree dep is better .  More affective range.  More socially engaged.  Quicker thought.  More active .  Less down and less negative.  07/23/23 appt noted: Med: Med:  duloxetine  90, Concerta  36 mg AM, Lyrica  100 HS, no ropinirole .  No SE Received Spravato 84 mg today.  Experienced very mild dissociation and no sig HA, N.  But some dizziness.  Resolved by end of 2 hours observation.  Mildly drowsy at end of session. BC of gait px he needed to use WC to leave the office. No med concerns.  Seen with W.    Dep is still improved.  But is dealing with poor gait, weak and slow.  Will start neurorehab today.   Anxiety re: OCD manageable. Thought and affect quicker and more responsive .  Humor returned.  07/25/23 appt:  Med: Med:  duloxetine  90, Concerta  36 mg AM, Lyrica  100 HS, no ropinirole .  No SE Received Spravato 84 mg today.  Experienced very mild dissociation and no sig HA, N.  But some dizziness.  Resolved by end of 2 hours observation.  Mildly drowsy at end of session. BC of gait px he needed to use WC to leave the office. No med  concerns.  Seen with W.    Overall mood still improving but not 100%.  Starting neurorehab for weakness.  No new med concerns.  Satisfied with meds.  07/30/23 appt noted:  Med: Med:  duloxetine  90, Concerta  36 mg AM, Lyrica  100 HS, no ropinirole .  No SE Received Spravato 84 mg today.  Experienced very mild dissociation and no sig HA, N.  But some dizziness.  Resolved by end of 2 hours observation.  Mildly drowsy at end of session. BC of gait px he needed to use WC to leave the office. No med concerns.  Seen with W.    Both agree mood is improving and activity and interest.  Not 100% but limited by mobility and starting PT.    08/15/23 appt noted:  Med:  duloxetine  90, Concerta  36 mg AM, Lyrica  100  HS, no ropinirole .  No SE Received Spravato 56 mg today.  Experienced very mild dissociation and no sig HA, N.  But some dizziness.  Resolved by end of 2 hours observation.  Mildly drowsy at end of session. BC of gait px he needed to use WC to leave the office. No med concerns.  Seen with W.    Less drowsy at  end of time here than when got 84 mg daily. They both agree good response with dep and anxiety.  More involved and better interaction socially.  More initiative.  ECT-MADRS    Flowsheet Row Office Visit from 06/18/2023 in [facility] Office Visit from 04/16/2023 in [facility]  MADRS Total Score 37 36   PHQ2-9    Flowsheet Row Office Visit from 03/15/2020 in [facility] Total Score 3  PHQ-9 Total Score 9     B schizophrenic SUI. After M's death. PCP Cecil Ee at [facility] at 73 years old.  Prior psychiatric medication trials include  Lexapro  20, citalopram NR, clomipramine weight gain, paroxetine, fluoxetine, Luvox, Trintellix,   Increase Lexapro  back to 20 mg January 2020. & 10/2020 bupropion ,  Auvelity NR  Abilify  10 fatigue,  Cerefolin NAC,  and   Naltrexone  sexual SE Lyrica  150 tired  pramipexole,  ropinirole  Adderall XR & IR sexual SE,  Ritalin  30, Concerta  72 mg AM NR modafinil  and Nuvigil,   History [employer] OCD  Review of Systems:  Review of Systems  Constitutional:  Positive for fatigue. Negative for fever.  Cardiovascular:  Negative for chest pain and palpitations.  Genitourinary:        ED  Musculoskeletal:  Positive for arthralgias, gait problem and myalgias.  Neurological:  Positive for weakness. Negative for syncope.  Psychiatric/Behavioral:  Positive for dysphoric mood and sleep disturbance. Negative for agitation, behavioral problems, confusion, decreased concentration, hallucinations, self-injury and suicidal ideas. The patient is nervous/anxious. The patient is not hyperactive.     Medications: I have reviewed the patient's current medications.  Current Outpatient Medications  Medication Sig Dispense Refill   ALPRAZolam  (XANAX ) 0.25 MG tablet TAKE 1 TABLET (0.25 MG TOTAL) BY MOUTH 2 (TWO) TIMES DAILY AS NEEDED FOR ANXIETY OR SLEEP. 30 tablet 1   aspirin 81 MG tablet Take 81 mg by mouth daily.     Cholecalciferol (VITAMIN D-3) 5000 units TABS Take 5,000 Units by mouth daily.      DULoxetine  (CYMBALTA ) 30 MG capsule Take 3 capsules (90 mg total) by mouth daily. 270 capsule 0   methylphenidate  36 MG PO CR tablet Take 1 tablet (36 mg total) by mouth daily. 30 tablet 0   naltrexone  (DEPADE) 50 MG tablet Take 0.5 tablets (25 mg total) by mouth daily. 45 tablet 1   pregabalin  (LYRICA ) 50 MG capsule Take 1 capsule (50 mg total) by mouth at bedtime. 90 capsule 0   simvastatin (ZOCOR) 20 MG tablet Take 20 mg by mouth at bedtime.     ZEPBOUND  10 MG/0.5ML Pen Inject 10 mg into the skin once a week.     Semaglutide ,0.25 or 0.5MG /DOS, (OZEMPIC , 0.25 OR 0.5 MG/DOSE,) 2 MG/1.5ML SOPN Inject 2.4 mg into the skin once a week.     No current facility-administered medications for this visit.    Medication Side  Effects: None sexual SE are better not  All gone.  Allergies:  Allergies  Allergen Reactions   E.E.S. [Erythromycin] Hives  Macrolides And Ketolides Other (See Comments)    EES    Rosuvastatin     Other reaction(s): cramps    Past Medical History:  Diagnosis Date   Allergy    Anemia    iron- pt denies    Anxiety    Aortic cusp regurgitation    Carotid artery occlusion    Constipation    Coronary artery stenosis    Hyperlipidemia    Lactose intolerance    Major depression, recurrent, chronic (HCC)    Obesity    OCD (obsessive compulsive disorder)    OSA (obstructive sleep apnea)    Other chronic pain    Periodic limb movement disorder    Periodic limb movements of sleep    Prediabetes    Pure hypercholesterolemia    Restless legs    Sleep apnea    wears cpap    Vitamin D deficiency     Family History  Problem Relation Age of Onset   Cancer Mother        breast and ovarian   Anxiety disorder Mother    Breast cancer Mother    Ovarian cancer Mother    Depression Father        bi-polar   Hyperlipidemia Father    Heart disease Father    Sudden death Father    Bipolar disorder Father    Sleep apnea Father    Obesity Father    Depression Son    Colon cancer Neg Hx    Colon polyps Neg Hx    Esophageal cancer Neg Hx    Rectal cancer Neg Hx    Stomach cancer Neg Hx     Social History   Socioeconomic History   Marital status: Married    Spouse name: Not on file   Number of children: Not on file   Years of education: Not on file   Highest education level: Not on file  Occupational History   Occupation: retired Pensions consultant  Tobacco Use   Smoking status: Never   Smokeless tobacco: Never  Substance and Sexual Activity   Alcohol use: Yes    Comment: occasionally    Drug use: No   Sexual activity: Not on file  Other Topics Concern   Not on file  Social History Narrative   Not on file   Social Drivers of Health   Financial Resource Strain: Not on file   Food Insecurity: No Food Insecurity (01/25/2022)   Received from [facility] visits prior to 03/24/2022., [facility]   Hunger Vital Sign    Worried About Running Out of Food in the Last Year: Never true    Ran Out of Food in the Last Year: Never true  Transportation Needs: No Transportation Needs (01/25/2022)   Received from [employer], [facility] visits prior to 03/24/2022.   PRAPARE - Administrator, Civil Service (Medical): No    Lack of Transportation (Non-Medical): No  Physical Activity: Not on file  Stress: Not on file  Social Connections: Not on file  Intimate Partner Violence: Not on file    Past Medical History, Surgical history, Social history, and Family history were reviewed and updated as appropriate.   Please see review of systems for further details on the patient's review from today.   Objective:   Physical Exam:  There were no vitals taken for this visit.  Physical Exam Constitutional:      General: He is not in  acute distress.    Appearance: He is obese.  Musculoskeletal:        General: No deformity.  Neurological:     Mental Status: He is alert and oriented to person, place, and time.     Cranial Nerves: No dysarthria.     Coordination: Coordination abnormal.     Comments: Shortened gait is better.  No sig tremor.  less Slow.  Psychiatric:        Attention and Perception: Attention and perception normal. He does not perceive auditory or visual hallucinations.        Mood and Affect: Mood is anxious and depressed. Affect is not labile, blunt, angry or inappropriate.        Speech: Speech normal.        Behavior: Behavior is slowed. Behavior is cooperative.        Thought Content: Thought content normal. Thought content is not paranoid or delusional. Thought content does not include homicidal or suicidal ideation. Thought content does not include suicidal plan.        Cognition and Memory: Cognition  and memory normal.        Judgment: Judgment normal.     Comments: Insight intact Depression 60% better OCD is about the same and not the main problem More expressive and more normal affect.    November 06, 2018: [place] Cog test in office within normal limits MMSE 28/30. Animal fluency 17 . (borderline) Taken as a whole, no indication to pursue neuropsychological testing.  Mini-Mental status exam 28/30 on 10/27/20.  No evidence of dementia.  Lab Review:   Vitamin D level acceptable at 54.5.   Normal B12 and folate and TSH in last couple of years..  Echocardiogram is stable re: AVR over the last 8 years and not likely the cause of lethargy.   06/28/23 MRI head:  IMPRESSION: 1. No acute intracranial abnormality. 2. 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 Head CT may be helpful for further assessment of the degree of mineralization. 3. Findings of chronic small vessel ischemia.  SABRAres Assessment: Plan:    Keo was seen today for follow-up, depression, anxiety, fatigue and add.  Diagnoses and all orders for this visit:  Recurrent major depression resistant to treatment [facility])  Mixed obsessional thoughts and acts  Attention deficit hyperactivity disorder (ADHD), predominantly inattentive type  Hypersomnia with sleep apnea  Obstructive sleep apnea  Mild cognitive impairment  Restless legs syndrome  Low vitamin D level  Erectile disorder, acquired, generalized, moderate    Mr. Teems has a long history of depression and OCD.  Major depression with fatigue, cognitive and physical slowness, anhedonia is much worse.  Started Spravato  and improving.  Was better energy with duloxetine  90 vs Lexapro  so increased to 120 mg daily DT worsening depression.    But it was not better. So reduced it back to 90 mg daily. Dep is much worse, in fact, his  worst depression ever. pursue Spravato  He has some compulsive checking and obsessions around the house maintenance.  When travels then tends to have less OCD bc triggered less.    Reduced Spravato to 56 mg bc seemed quite sedated recently with 84 mg .  Not as much at end of session today. The patient experienced the typical dissociation which gradually resolved over the 2-hour period of observation.  There were no complications.  Specifically the patient did not have nausea or vomiting  or headache.  Blood pressures remained within normal ranges at the 40-minute and 2-hour follow-up intervals.  By the time the 2-hour observation period was met the patient was alert and oriented and able to exit without assistance.  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  See nursing note for further details. Emphasized need to relax with Spravato and not return calls, read, return chats, emails etc. Started Spravato 06/25/23  His OCD is persistent with checking lites, stove, etc. .  It is better at the moment DT depression being worse.  Disc CBT techniques and potential for more therapy to address. Option Dr. Marijean.  Continue  retrial Concerta  36 mg AM He wants to use it for depression and cognitive concerns.  Discussed potential benefits, risks, and side effects of stimulants with patient to include increased heart rate, palpitations, insomnia, increased anxiety, increased irritability, or decreased appetite.  Instructed patient to contact office if experiencing any significant tolerability issues.  Extensive discussion of sleep study and he has a copy.  Why is there virtually no N3 & REM sleep?  Is that affecting daytime alertness and fatigue?  Vs how much is related to mild OSA and PLMS?  Disc this in detail.  Wrote coreespondence with sleep doc about it.  Options for sleep & fatigue:  Difficult to assess  bc has  been out of country and then sick for 3 weeks.   Focus on reduction in OSA Focus on improving deep stage sleep.  ? Rx low dose mirtazapine or alternatives Focus on leg movements (hx RLS/PLMS) continue Trial as had been suggested Lyrica  for FM type sx and may help RLS/PLMS.  Better dreaming on 100 mg HS and too sleepy on 150.  Decreased  to 100 mg HS for sleep and back pain and RLS No ropinirole  needed.   Disc SE.  Not having any.   Use LED Xanax  and try to avoid BZ daytime bc fatigue.  He doesn't use it much daythime.  Using 0..25-0.5 mg at night.  May need less with more Lyrica  at [facility] and try to minimize.  No hangover. We discussed the short-term risks associated with benzodiazepines including sedation and increased fall risk among others.  Discussed long-term side effect risk including dependence, potential withdrawal symptoms, and the potential eventual dose-related risk of dementia.  But recent studies from 2020 dispute this association between benzodiazepines and dementia risk. Newer studies in 2020 do not support an association with dementia.  Stimulant partially successfully used off label to augment antidepressants for depression and have resulted in improved productivity and attention.    Previous screening of memory was not suggestive of any neuro degenerative process: Mini-Mental status exam 28/30 on 10/27/20.   Recent cognition worse as dep worsened.  Also gait is short and slow.  Pending neuro appt.  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  Plan:  no med changes.   continue duloxetine  to 120 mg daily.  Off Lexapro .  Lyrica  100 mg HS.  resumed MPH ER 36 mg AM   Spravato disc in detail.  He wants to continue.  He and wife notice he's better.  Administer now drop back to weekly  He's still improving.  Will administer at 56 mg .   Follow-up   Lorene Macintosh MD, DFAPA.  Please see After Visit Summary for patient specific instructions.  Future Appointments  Date Time Provider  Department Center  08/16/2023 12:30 PM [place], Chloe  N, PT [facility]  08/19/2023 11:00 AM Annett Sheffield SAILOR, PT [facility]  08/20/2023  9:00 AM Cottle, Lorene KANDICE Raddle., MD [facility] None  08/20/2023  9:00 AM CP-NURSE [facility] None  08/21/2023  8:00 AM Annett Sheffield SAILOR, PT [facility]  08/27/2023  9:00 AM Cottle, Lorene KANDICE Raddle., MD [facility] None  08/27/2023  9:00 AM CP-NURSE [facility] None  08/28/2023 12:30 PM Plaster, Marlon BRAVO, PT [facility]  08/30/2023 12:30 PM Plaster, Marlon BRAVO, PT [facility]  09/04/2023 12:30 PM Annett Sheffield SAILOR, PT [facility]  09/06/2023 12:30 PM Annett Sheffield SAILOR, PT [facility]  09/09/2023  9:30 AM Annett Sheffield SAILOR, PT [facility]  09/17/2023  1:00 PM Cottle, Lorene KANDICE Raddle., MD [facility] None  10/15/2023  1:30 PM Cottle, Lorene KANDICE Raddle., MD [facility] None  01/30/2024  3:00 PM Tat, Asberry RAMAN, DO [facility] None     No orders of the defined types were placed in this encounter.      -------------------------------

## 2023-08-15 NOTE — Progress Notes (Signed)
 NURSES NOTE:         Pt arrived for his 15 th Spravato Treatment for treatment resistant depression, the starting dose was 56 mg (2 of the 28 mg nasal sprays) and he tolerated it well with no side effects or complaints. Pt had a tough treatment on Tuesday and after discussing with Dr. Geoffry he agreed to decrease today's treatment to 56 mg. Tywone is a patient of Dr. Calhoun so he will follow his care throughout treatments and follow ups. Pt's Spravato is a medical authorization through buy and bill.  Spravato medication is stored at treatment center per REMS/FDA guidelines. The medication is required to be locked behind two doors per REMS/FDA protocol. Medication is also disposed of properly after each use per regulations. All documentation for REMS is completed and submitted per FDA/REMS requirements.          Began taking patient's vital signs at 9:10 AM 130/92, pulse 65, SpO2 95%. Instructed patient to blow his nose if needed then recline back to a 45 degree angle. Pt is aware he is only getting 56 mg which is 2 devices instead of 3. He agreed. Gave patient first dose 28 mg nasal spray, administered in each nostril as directed and observed by nurse, waited 5 more minutes for the second dose.  After both doses given pt did not complain of any nausea/vomiting. Pt was given water, snacks, and candy. Pt reports he forgot his phone today so he won't be listening to anything. Assessed his 40 minute vitals, 9:50 AM, 141/95, pulse 66, SpO2 94%. Informed [name], NP as well as Dr. Geoffry who is out of the office sick today. Explained he would be monitored for a total time of 120 minutes. Discharge vitals were taken at 11:02 AM 131/74, P 66, SpO2 94%. Dr. Geoffry came to visit with patient once his thoughts were clearer to discuss how treatment went. Pt will now starting treatments weekly and continue the 56 mg dose. Recommend he go home and sleep or just relax on the couch. No driving, no intense  activities. Verbalized understanding. Nurse was with pt a total of 60 minutes for clinical assessment. Pt was able to use his rolling walker on the way down to the vehicle, his gait is improving since starting therapy twice a week. Instructed to call with any issues. Pt will return weekly on Tuesdays.    LOT [id number] EXP APR 2027

## 2023-08-16 ENCOUNTER — Telehealth: Payer: Self-pay | Admitting: Physical Therapy

## 2023-08-16 ENCOUNTER — Encounter: Payer: Self-pay | Admitting: Physical Therapy

## 2023-08-16 ENCOUNTER — Ambulatory Visit: Admitting: Physical Therapy

## 2023-08-16 VITALS — BP 129/86 | HR 77

## 2023-08-16 DIAGNOSIS — M6281 Muscle weakness (generalized): Secondary | ICD-10-CM

## 2023-08-16 DIAGNOSIS — R2689 Other abnormalities of gait and mobility: Secondary | ICD-10-CM

## 2023-08-16 DIAGNOSIS — R293 Abnormal posture: Secondary | ICD-10-CM | POA: Diagnosis not present

## 2023-08-16 DIAGNOSIS — R2681 Unsteadiness on feet: Secondary | ICD-10-CM | POA: Diagnosis not present

## 2023-08-16 DIAGNOSIS — G20C Parkinsonism, unspecified: Secondary | ICD-10-CM | POA: Diagnosis not present

## 2023-08-16 NOTE — Therapy (Signed)
 OUTPATIENT PHYSICAL THERAPY NEURO TREATMENT   Patient Name: Eric [name] MRN: [id number] DOB:12-Oct-1950, 73 y.o., male Today's Date: 08/16/2023   PCP: Regino Slater, MD   REFERRING PROVIDER: Evonnie Asberry RAMAN, DO    END OF SESSION:  PT End of Session - 08/16/23 1231     Visit Number 7    Number of Visits 13    Date for PT Re-Evaluation 09/21/23    Authorization Type [employer]ICARE    PT Start Time 1231    PT Stop Time 1315    PT Time Calculation (min) 44 min    Equipment Utilized During Treatment Gait belt    Activity Tolerance Patient tolerated treatment well    Behavior During Therapy WFL for tasks assessed/performed          Past Medical History:  Diagnosis Date   Allergy    Anemia    iron- pt denies    Anxiety    Aortic cusp regurgitation    Carotid artery occlusion    Constipation    Coronary artery stenosis    Hyperlipidemia    Lactose intolerance    Major depression, recurrent, chronic (HCC)    Obesity    OCD (obsessive compulsive disorder)    OSA (obstructive sleep apnea)    Other chronic pain    Periodic limb movement disorder    Periodic limb movements of sleep    Prediabetes    Pure hypercholesterolemia    Restless legs    Sleep apnea    wears cpap    Vitamin D deficiency    Past Surgical History:  Procedure Laterality Date   COLONOSCOPY     DG BARIUM ENEMA ([facility] HX)     10-19-2010- turtous, elongated colon    HEMORRHOID SURGERY     TONSILLECTOMY  1960   Patient Active Problem List   Diagnosis Date Noted   Vitamin D deficiency 03/30/2020   Pure hypercholesterolemia 03/30/2020   Prediabetes 03/30/2020   Occlusion and stenosis of bilateral carotid arteries 03/30/2020   Obstructive sleep apnea syndrome 03/30/2020   Aortic cusp regurgitation 03/30/2020   Restless legs syndrome 03/30/2020   Moderate recurrent major depression (HCC) 03/30/2020   Chronic pain 03/30/2020   OCD (obsessive compulsive disorder)  10/15/2017   Family history of ovarian cancer 04/19/2011   Family history of breast cancer 04/19/2011    ONSET DATE: 07/12/2023   REFERRING DIAG: G20.C (ICD-10-CM) - Primary parkinsonism (HCC)   THERAPY DIAG:  Unsteadiness on feet  Other abnormalities of gait and mobility  Muscle weakness (generalized)  Abnormal posture  Rationale for Evaluation and Treatment: Rehabilitation  SUBJECTIVE:  SUBJECTIVE STATEMENT: Pt presents to PT treatment session using rollator. Pt denies any falls / close falls since last session. Pt states he feels he's taking bigger steps today. Pt reports he had training yesterday and it went alright.    Pt accompanied by: Self   PERTINENT HISTORY: PMH: Parkinsonism (Possibly due to exposure to antipsychotic medication), tardive dyskinesias, restless leg syndrome, CAD, pre-diabetes, obesity, OCD, major depression,HLD  He was not exposed to anything for very long, but wife reports that symptoms have really been developing over the last 5 to 6 weeks. He was on Abilify  for a very short time in February and March and then was on Vraylar  from March until June. The symptoms developed around that time. He is off of the Vraylar  now (just went off of it)   Per Dr. Evonnie regarding abnormal brain scan:  It does appear that there is heavy basal ganglia calcification.  We can certainly confirm this with CT.  While Fahrs disease is on the differential, the timeline for his symptoms really correlates more with the timeline for when he was placed on antipsychotic medication.   PAIN:  Are you having pain? No  PRECAUTIONS: Fall  FALLS: Has patient fallen in last 6 months? No and almost falls grabbing the wall   LIVING ENVIRONMENT: Lives with: lives with their spouse Lives in:  House/apartment Stairs: Yes: Internal: 12 steps; on left going up and External: 4 and 5 steps; none Has following equipment at home: Single point cane and Grab bars  PLOF: Independent and Leisure: doesn't have any hobbies except cleaning up the house  PATIENT GOALS: Wants to get back to his regular gait and his regular walk so he can go on trips   OBJECTIVE:  Note: Objective measures were completed at Evaluation unless otherwise noted.  DIAGNOSTIC FINDINGS: MRI brain: IMPRESSION: 1. No acute intracranial abnormality. 2. 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 Head CT may be helpful for further assessment of the degree of mineralization. 3. Findings of chronic small vessel ischemia.  COGNITION: Overall cognitive status: Within functional limits for tasks assessed   SENSATION: WFL  COORDINATION: Heel to shin: some bradykinesia RLE RAM: dysmetric BUE     POSTURE: rounded shoulders, forward head, and posterior pelvic tilt   LOWER EXTREMITY MMT:    MMT Right Eval Left Eval  Hip flexion 3- 4  Hip extension    Hip abduction    Hip adduction    Hip internal rotation    Hip external rotation    Knee flexion 4 5  Knee extension 4 4  Ankle dorsiflexion 5 5  Ankle plantarflexion    Ankle inversion    Ankle eversion    (Blank rows = not tested)  BED MOBILITY:   Pt reports some difficulties getting under the sheets    TRANSFERS: Sit to stand: Modified independence  Assistive device utilized: None     Stand to sit: Modified independence  Assistive device utilized: None     Pt reports difficulties getting up from lower surfaces or in and out of the car.   GAIT: Findings: Gait Characteristics: decreased arm swing- Right, decreased arm swing- Left, decreased step length- Right, decreased step length- Left, decreased stride length, decreased hip/knee  flexion- Right, decreased hip/knee flexion- Left, decreased ankle dorsiflexion- Right, decreased ankle dorsiflexion- Left, Right foot flat, Left foot flat, shuffling, decreased trunk rotation, trunk flexed, and narrow BOS, Distance walked: Clinic  distances, Assistive device utilized:Single point cane and None, Level of assistance: CGA, and Comments: Pt with very shuffled steps. When pt gets single HHA from therapist for a couple steps, pt able to demo improved step length   FUNCTIONAL TESTS:  5 times sit to stand: 15.6 seconds with no UE support, incr forward flexed posture in standing  Timed up and go (TUG): 27.4 seconds with no AD, CGA with en bloc turning 10 meter walk test: 62 seconds with no AD and CGA = .52 ft/sec                                                                                                                                TREATMENT DATE: 08/16/23  Therapeutic Activity:  Today's Vitals   08/16/23 1239  BP: 129/86  Pulse: 77  SpO2: 97%  *sitting (start of session) *vitals WNL  Education on proper steps to take when going to sit using the rollator (i.e. backing up all the way to the mat table and locking rollator brakes prior to sitting)   NMR:  16 mins ring toss while standing on foam pad, feet together>tandem stance - CGA (RPE: 7/10) Feet together - 8 mins: 4 hits total Pt able to attain/maintain balance without significant difficulty and held position for while performing ring toss with success  Modified tandem stance - 8 mins: 3 hits total Pt challenged with sequencing which resulted in less accuracy/precision in his tosses and tosses to the wrong colored pins Pt initially challenged with attaining position, requiring modA x2, once position was attained pt especially challenged with strong posterior lean, which resulted in pt stepping off foam pad Pt attempted again with success, requiring minA x2 to attain position and maintained balance for the remainder of ring  toss 2x 12 reps of STS at mat table - CGA (RPE: 7/10) Pt required minimal verbal cues for upright posture with good carryover Pt performed with proper form and technique and put forth great effort PT administered ~2 minute seated rest break for recovery 1x 10 reps of alternating mini lunges to foam pad - CGA Pt required 2 finger support to maintain balance Pt able to increase bilat knee flexion for last 2 reps with proper form and technique   PATIENT EDUCATION: Education details: see above, update HEP as needed, continue standing PWR moves at home   Person educated: Patient Education method: Explanation and Demonstration Education comprehension: verbalized understanding, returned demonstration, verbal cues required, and needs further education  HOME EXERCISE PROGRAM: Standing PWR Moves   GOALS: Goals reviewed with patient? Yes  SHORT TERM GOALS: Target date: 08/13/2023   1. Pt will be independent with initial HEP with gait, balance, strength in order to build upon functional gains made in therapy. Baseline: pt reports doing standing PWR moves every other day Goal status: PARTIALLY MET  2.  Pt will improve TUG time to 22 seconds or less with no AD vs. LRAD in  order to demo decrease fall risk. Baseline: 27.4 seconds with no AD, CGA  16.6 seconds with no AD SBA/CGA (7/23) Goal status: MET  3.  Pt will improve gait speed with LRAD to at least 1.3 ft/sec in order to demo decr fall risk/improved household mobility.  Baseline: 62 seconds with no AD = .52 ft/sec and CGA   21.7 seconds with rollator = 1.51 ft/sec Goal status: MET  4.  Pt will ambulate at least 230' with supervision with LRAD over level indoor surfaces in order to demo improved household mobility.  Baseline: pt ambulates with no AD with CGA   Ambulates 230' with supervision and rollator  Goal status: MET   LONG TERM GOALS: Target date: 09/03/2023   Pt will be independent with final HEP with gait, balance,  strength in order to build upon functional gains made in therapy. Baseline:  Goal status: INITIAL  2.  Pt will improve TUG time to 13.5 seconds or less with no AD vs. LRAD in order to demo decrease fall risk. Baseline: 27.4 seconds with no AD, CGA  16.6 seconds with no AD SBA/CGA (7/23) Goal status: REVISED  3.  Pt will improve 5x sit<>stand to less than or equal to 13 sec to demonstrate improved functional strength and transfer efficiency.  Baseline: 15.6 seconds with no UE support, incr forward flexed posture in standing Goal status: INITIAL  4.  Pt will improve gait speed with LRAD to at least 1.8 ft/sec in order to demo decr fall risk  Baseline: 62 seconds with no AD = .52 ft/sec and CGA Goal status: INITIAL  5.  Pt will ambulate at least 500' with supervision with LRAD over outdoor unlevel surfaces in order to demo improved community mobility.  Baseline:  Goal status: INITIAL    ASSESSMENT:  CLINICAL IMPRESSION: Pt seen for skilled PT treatment session with an emphasis on targeted static>dynamic balance training. Pt continues to progress towards all LTGs set at initial evaluation. Pt tolerated today's treatment session well with no pain reported. Pt continues to be challenged with higher-level dynamic balance, particularly with attaining/maintaining modified tandem stance on the foam pad, demonstrating a strong posterior lean. PT will continue to address the aforementioned deficits to decrease the risk for future falls. Pt highly benefits from PT verbal instructions and demonstrations before engaging in static>dynamic balance training. Continue POC.  OBJECTIVE IMPAIRMENTS: Abnormal gait, decreased activity tolerance, decreased balance, decreased coordination, decreased knowledge of condition, decreased knowledge of use of DME, decreased mobility, difficulty walking, decreased strength, decreased safety awareness, impaired flexibility, and postural dysfunction.   ACTIVITY  LIMITATIONS: transfers, bed mobility, and locomotion level  PARTICIPATION LIMITATIONS: shopping, community activity, and yard work  PERSONAL FACTORS: Age, Behavior pattern, Past/current experiences, Time since onset of injury/illness/exacerbation, and 3+ comorbidities: arkinsonism (Possibly due to exposure to antipsychotic medication), tardive dyskinesias, restless leg syndrome, CAD, pre-diabetes, obesity, OCD, major depression,HLD are also affecting patient's functional outcome.   REHAB POTENTIAL: Good  CLINICAL DECISION MAKING: Evolving/moderate complexity  EVALUATION COMPLEXITY: Moderate  PLAN:  PT FREQUENCY: 2x/week  PT DURATION: 8 weeks  PLANNED INTERVENTIONS: 97164- PT Re-evaluation, 97110-Therapeutic exercises, 97530- Therapeutic activity, 97112- Neuromuscular re-education, 97535- Self Care, [id number]- Manual therapy, [phone]- Gait training, Patient/Family education, Balance training, Stair training, and DME instructions  PLAN FOR NEXT SESSION:   warm up on [employer], trial out boxing? work on incr stride length, foot clearance tasks; balance tasks working on larger amplitude movements posture and trunk rotation  Add additional balance to HEP  Waddell Nailer, Student-PT,DPT 08/16/2023, 1:30 PM

## 2023-08-16 NOTE — Telephone Encounter (Signed)
 Attempted to call pt regar

## 2023-08-19 ENCOUNTER — Ambulatory Visit: Admitting: Physical Therapy

## 2023-08-19 ENCOUNTER — Encounter: Payer: Self-pay | Admitting: Physical Therapy

## 2023-08-19 VITALS — BP 123/72 | HR 73

## 2023-08-19 DIAGNOSIS — R2689 Other abnormalities of gait and mobility: Secondary | ICD-10-CM | POA: Diagnosis not present

## 2023-08-19 DIAGNOSIS — R293 Abnormal posture: Secondary | ICD-10-CM

## 2023-08-19 DIAGNOSIS — M6281 Muscle weakness (generalized): Secondary | ICD-10-CM

## 2023-08-19 DIAGNOSIS — R2681 Unsteadiness on feet: Secondary | ICD-10-CM | POA: Diagnosis not present

## 2023-08-19 DIAGNOSIS — G20C Parkinsonism, unspecified: Secondary | ICD-10-CM | POA: Diagnosis not present

## 2023-08-19 NOTE — Therapy (Signed)
 OUTPATIENT PHYSICAL THERAPY NEURO TREATMENT   Patient Name: Eric [name] MRN: [id number] DOB:06-30-1950, 73 y.o., male Today's Date: 08/19/2023   PCP: Regino Slater, MD   REFERRING PROVIDER: Evonnie Asberry RAMAN, DO    END OF SESSION:  PT End of Session - 08/19/23 1103     Visit Number 8    Number of Visits 13    Date for PT Re-Evaluation 09/21/23    Authorization Type [employer]    PT Start Time 1102    PT Stop Time 1142    PT Time Calculation (min) 40 min    Equipment Utilized During Treatment Gait belt    Activity Tolerance Patient tolerated treatment well    Behavior During Therapy WFL for tasks assessed/performed;Flat affect          Past Medical History:  Diagnosis Date   Allergy    Anemia    iron- pt denies    Anxiety    Aortic cusp regurgitation    Carotid artery occlusion    Constipation    Coronary artery stenosis    Hyperlipidemia    Lactose intolerance    Major depression, recurrent, chronic (HCC)    Obesity    OCD (obsessive compulsive disorder)    OSA (obstructive sleep apnea)    Other chronic pain    Periodic limb movement disorder    Periodic limb movements of sleep    Prediabetes    Pure hypercholesterolemia    Restless legs    Sleep apnea    wears cpap    Vitamin D deficiency    Past Surgical History:  Procedure Laterality Date   COLONOSCOPY     DG BARIUM ENEMA ([facility] HX)     10-19-2010- turtous, elongated colon    HEMORRHOID SURGERY     TONSILLECTOMY  1960   Patient Active Problem List   Diagnosis Date Noted   Vitamin D deficiency 03/30/2020   Pure hypercholesterolemia 03/30/2020   Prediabetes 03/30/2020   Occlusion and stenosis of bilateral carotid arteries 03/30/2020   Obstructive sleep apnea syndrome 03/30/2020   Aortic cusp regurgitation 03/30/2020   Restless legs syndrome 03/30/2020   Moderate recurrent major depression (HCC) 03/30/2020   Chronic pain 03/30/2020   OCD (obsessive compulsive  disorder) 10/15/2017   Family history of ovarian cancer 04/19/2011   Family history of breast cancer 04/19/2011    ONSET DATE: 07/12/2023   REFERRING DIAG: G20.C (ICD-10-CM) - Primary parkinsonism (HCC)   THERAPY DIAG:  Unsteadiness on feet  Other abnormalities of gait and mobility  Abnormal posture  Muscle weakness (generalized)  Rationale for Evaluation and Treatment: Rehabilitation  SUBJECTIVE:  SUBJECTIVE STATEMENT: Reports feeling more tired today. No falls.   Pt accompanied by: Self and wife   PERTINENT HISTORY: PMH: Parkinsonism (Possibly due to exposure to antipsychotic medication), tardive dyskinesias, restless leg syndrome, CAD, pre-diabetes, obesity, OCD, major depression,HLD  He was not exposed to anything for very long, but wife reports that symptoms have really been developing over the last 5 to 6 weeks. He was on Abilify  for a very short time in February and March and then was on Vraylar  from March until June. The symptoms developed around that time. He is off of the Vraylar  now (just went off of it)   Per Dr. Evonnie regarding abnormal brain scan:  It does appear that there is heavy basal ganglia calcification.  We can certainly confirm this with CT.  While Fahrs disease is on the differential, the timeline for his symptoms really correlates more with the timeline for when he was placed on antipsychotic medication.   PAIN:  Are you having pain? No  PRECAUTIONS: Fall  FALLS: Has patient fallen in last 6 months? No and almost falls grabbing the wall   LIVING ENVIRONMENT: Lives with: lives with their spouse Lives in: House/apartment Stairs: Yes: Internal: 12 steps; on left going up and External: 4 and 5 steps; none Has following equipment at home: Single point cane and Grab  bars  PLOF: Independent and Leisure: doesn't have any hobbies except cleaning up the house  PATIENT GOALS: Wants to get back to his regular gait and his regular walk so he can go on trips   OBJECTIVE:  Note: Objective measures were completed at Evaluation unless otherwise noted.  DIAGNOSTIC FINDINGS: MRI brain: IMPRESSION: 1. No acute intracranial abnormality. 2. 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 Head CT may be helpful for further assessment of the degree of mineralization. 3. Findings of chronic small vessel ischemia.  COGNITION: Overall cognitive status: Within functional limits for tasks assessed   SENSATION: WFL  COORDINATION: Heel to shin: some bradykinesia RLE RAM: dysmetric BUE     POSTURE: rounded shoulders, forward head, and posterior pelvic tilt   LOWER EXTREMITY MMT:    MMT Right Eval Left Eval  Hip flexion 3- 4  Hip extension    Hip abduction    Hip adduction    Hip internal rotation    Hip external rotation    Knee flexion 4 5  Knee extension 4 4  Ankle dorsiflexion 5 5  Ankle plantarflexion    Ankle inversion    Ankle eversion    (Blank rows = not tested)  BED MOBILITY:   Pt reports some difficulties getting under the sheets    TRANSFERS: Sit to stand: Modified independence  Assistive device utilized: None     Stand to sit: Modified independence  Assistive device utilized: None     Pt reports difficulties getting up from lower surfaces or in and out of the car.   GAIT: Findings: Gait Characteristics: decreased arm swing- Right, decreased arm swing- Left, decreased step length- Right, decreased step length- Left, decreased stride length, decreased hip/knee flexion- Right, decreased hip/knee flexion- Left, decreased ankle dorsiflexion- Right, decreased ankle dorsiflexion- Left, Right foot flat, Left foot flat,  shuffling, decreased trunk rotation, trunk flexed, and narrow BOS, Distance walked: Clinic distances, Assistive device utilized:Single point cane and None, Level of assistance: CGA, and Comments: Pt with very shuffled steps. When pt gets single HHA from therapist for a couple  steps, pt able to demo improved step length   FUNCTIONAL TESTS:  5 times sit to stand: 15.6 seconds with no UE support, incr forward flexed posture in standing  Timed up and go (TUG): 27.4 seconds with no AD, CGA with en bloc turning 10 meter walk test: 62 seconds with no AD and CGA = .52 ft/sec                                                                                                                                TREATMENT DATE: 08/19/23  Therapeutic Activity:  Today's Vitals   08/19/23 1107  BP: 123/72  Pulse: 73   *sitting (start of session)  Education on proper steps to take when going to sit using the rollator (i.e. backing up all the way to the mat table and locking rollator brakes prior to sitting) Discussed ST and OT and what those disciplines would work on in regards to Parkinsonism, pt reports he does not have any difficulties that would warrant OT at this time. Pt does have hypophonia, but reports his voice is getting louder, so does not need ST at this time. Discussed that they are available in the future as needed.    NMR: 10 reps sit <> stands on foam with no UE support, cues for upright posture in standing  Performed an additional 10 reps with tossing 10# medicine ball for larger amplitude movements with pt naming animals in alphabetical order, does need help with cues on what to name   Pt performs PWR! Moves in standing position 10 reps at edge of mat table with chair in front as needed for balance:    PWR! Up for improved posture, an additional 10 reps with heel raise (pt with limited mobility with this), and additional 10 reps with added toe raise   PWR! Rock for improved  weighshifting  PWR! Twist for improved trunk rotation - needs cues to reset in midline before going to opposite side  PWR! Step for improved step initiation   Cues provided for larger amplitude movements, discussed importance on working on these at home  Marching at countertop down and back x4 reps with focus on SLS and longer strides and getting heel down first, added to HEP, majority performed with UE support  With agility ladder next to countertop to work on foot clearance/improved stride length  Performed down and back x2 reps focusing on stride length, down and back x2 reps focusing on reciprocal arm swing (pt very challenged by this and tends to just perform ipsilaterally), an additional down and back x3 reps with trying to maintain arm swing/longer steps and naming foods in alphabetical order (pt very challenged with this and tendency to name objects that are not foods despite cues from therapist and frequent reminders to name foods     PATIENT EDUCATION: Education details: see above, standing marching to HEP for SLS, continue standing PWR moves at home  Person educated: Patient Education method: Medical illustrator Education comprehension: verbalized understanding, returned demonstration, verbal cues required, and needs further education  HOME EXERCISE PROGRAM: Standing PWR Moves   Access Code: [email or web address] URL: [email or web address] Date: 08/19/2023 Prepared by: Sheffield Senate  Exercises - Standing Marching  - 1-2 x daily - 5 x weekly - 4 sets  GOALS: Goals reviewed with patient? Yes  SHORT TERM GOALS: Target date: 08/13/2023   1. Pt will be independent with initial HEP with gait, balance, strength in order to build upon functional gains made in therapy. Baseline: pt reports doing standing PWR moves every other day Goal status: PARTIALLY MET  2.  Pt will improve TUG time to 22 seconds or less with no AD vs. LRAD in order to demo decrease fall  risk. Baseline: 27.4 seconds with no AD, CGA  16.6 seconds with no AD SBA/CGA (7/23) Goal status: MET  3.  Pt will improve gait speed with LRAD to at least 1.3 ft/sec in order to demo decr fall risk/improved household mobility.  Baseline: 62 seconds with no AD = .52 ft/sec and CGA   21.7 seconds with rollator = 1.51 ft/sec Goal status: MET  4.  Pt will ambulate at least 230' with supervision with LRAD over level indoor surfaces in order to demo improved household mobility.  Baseline: pt ambulates with no AD with CGA   Ambulates 230' with supervision and rollator  Goal status: MET   LONG TERM GOALS: Target date: 09/03/2023   Pt will be independent with final HEP with gait, balance, strength in order to build upon functional gains made in therapy. Baseline:  Goal status: INITIAL  2.  Pt will improve TUG time to 13.5 seconds or less with no AD vs. LRAD in order to demo decrease fall risk. Baseline: 27.4 seconds with no AD, CGA  16.6 seconds with no AD SBA/CGA (7/23) Goal status: REVISED  3.  Pt will improve 5x sit<>stand to less than or equal to 13 sec to demonstrate improved functional strength and transfer efficiency.  Baseline: 15.6 seconds with no UE support, incr forward flexed posture in standing Goal status: INITIAL  4.  Pt will improve gait speed with LRAD to at least 1.8 ft/sec in order to demo decr fall risk  Baseline: 62 seconds with no AD = .52 ft/sec and CGA Goal status: INITIAL  5.  Pt will ambulate at least 500' with supervision with LRAD over outdoor unlevel surfaces in order to demo improved community mobility.  Baseline:  Goal status: INITIAL    ASSESSMENT:  CLINICAL IMPRESSION: Reviewed standing PWR moves from HEP with reminder cues for larger amplitude movements and continued to educate to perform at home to also help address balance. Worked on larger amplitude movements today with sit <> stands and gait with longer strides. Pt very challenged when  adding in reciprocal arm swing and cognitive dual task when incr stride length, and reverts back to more shuffled steps. Continue POC.  OBJECTIVE IMPAIRMENTS: Abnormal gait, decreased activity tolerance, decreased balance, decreased coordination, decreased knowledge of condition, decreased knowledge of use of DME, decreased mobility, difficulty walking, decreased strength, decreased safety awareness, impaired flexibility, and postural dysfunction.   ACTIVITY LIMITATIONS: transfers, bed mobility, and locomotion level  PARTICIPATION LIMITATIONS: shopping, community activity, and yard work  PERSONAL FACTORS: Age, Behavior pattern, Past/current experiences, Time since onset of injury/illness/exacerbation, and 3+ comorbidities: arkinsonism (Possibly due to exposure to antipsychotic medication), tardive dyskinesias, restless leg syndrome, CAD, pre-diabetes, obesity, OCD, major depression,HLD are  also affecting patient's functional outcome.   REHAB POTENTIAL: Good  CLINICAL DECISION MAKING: Evolving/moderate complexity  EVALUATION COMPLEXITY: Moderate  PLAN:  PT FREQUENCY: 2x/week  PT DURATION: 8 weeks  PLANNED INTERVENTIONS: 97164- PT Re-evaluation, 97110-Therapeutic exercises, 97530- Therapeutic activity, 97112- Neuromuscular re-education, 97535- Self Care, [id number]- Manual therapy, [phone]- Gait training, Patient/Family education, Balance training, Stair training, and DME instructions  PLAN FOR NEXT SESSION:   warm up on [employer], work on incr stride length, foot clearance tasks; balance tasks working on larger amplitude movements posture and trunk rotation, speed of movement, cog dual tasking   Add additional balance to HEP as needed   [employer], PT,DPT 08/19/2023, 12:33 PM

## 2023-08-20 ENCOUNTER — Ambulatory Visit

## 2023-08-20 ENCOUNTER — Ambulatory Visit (INDEPENDENT_AMBULATORY_CARE_PROVIDER_SITE_OTHER): Admitting: Psychiatry

## 2023-08-20 VITALS — BP 133/74 | HR 63

## 2023-08-20 DIAGNOSIS — G3184 Mild cognitive impairment, so stated: Secondary | ICD-10-CM

## 2023-08-20 DIAGNOSIS — F339 Major depressive disorder, recurrent, unspecified: Secondary | ICD-10-CM

## 2023-08-20 DIAGNOSIS — F422 Mixed obsessional thoughts and acts: Secondary | ICD-10-CM

## 2023-08-20 DIAGNOSIS — R7989 Other specified abnormal findings of blood chemistry: Secondary | ICD-10-CM

## 2023-08-20 DIAGNOSIS — F5221 Male erectile disorder: Secondary | ICD-10-CM

## 2023-08-20 DIAGNOSIS — G4710 Hypersomnia, unspecified: Secondary | ICD-10-CM

## 2023-08-20 DIAGNOSIS — F900 Attention-deficit hyperactivity disorder, predominantly inattentive type: Secondary | ICD-10-CM

## 2023-08-20 DIAGNOSIS — G2581 Restless legs syndrome: Secondary | ICD-10-CM

## 2023-08-20 NOTE — Progress Notes (Signed)
 NURSES NOTE:         Pt arrived for his 16 th Spravato Treatment for treatment resistant depression, the starting dose was 56 mg (2 of the 28 mg nasal sprays) and he tolerated it well with no side effects or complaints. Pt had a tough treatment on Tuesday and after discussing with Dr. Geoffry he agreed to decrease today's treatment to 56 mg. Teren is a patient of Dr. Calhoun so he will follow his care throughout treatments and follow ups. Pt's Spravato is a medical authorization through buy and bill.  Spravato medication is stored at treatment center per REMS/FDA guidelines. The medication is required to be locked behind two doors per REMS/FDA protocol. Medication is also disposed of properly after each use per regulations. All documentation for REMS is completed and submitted per FDA/REMS requirements.          Began taking patient's vital signs at 9:05 AM 126/92, pulse 68, SpO2 95%. Instructed patient to blow his nose if needed then recline back to a 45 degree angle. Pt is aware he is only getting 56 mg which is 2 devices instead of 3. He agreed. Gave patient first dose 28 mg nasal spray, administered in each nostril as directed and observed by nurse, waited 5 more minutes for the second dose.  After both doses given pt did not complain of any nausea/vomiting. Pt was given water, snacks, and candy.  Assessed his 40 minute vitals, 9:45 AM, 134/65, pulse 65, SpO2 95%. Explained he would be monitored for a total time of 120 minutes. Discharge vitals were taken at 11:02 AM 133/74, P 63, SpO2 95%. Dr. Geoffry came to visit with patient once his thoughts were clearer to discuss how treatment went. Pt will now starting treatments weekly and continue the 56 mg dose. Recommend he go home and sleep or just relax on the couch. No driving, no intense activities. Verbalized understanding. Nurse was with pt a total of 60 minutes for clinical assessment. Pt was able to use his rolling walker on the way down to the  vehicle, his gait is improving since starting therapy twice a week. Instructed to call with any issues. Pt will return weekly on Tuesdays.    LOT [id number] EXP APR 2027

## 2023-08-20 NOTE — Progress Notes (Unsigned)
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 a trigger lately.  OCD driving me crazy wanting to make things perfect.   Other than the trigger had a nice time since here.  GS here for 5 days at 73 yo.  Enjoyed that.   Taking semaglutide   down to 250# from 283#. Card at [facility] says he does not have Aortic aneurysm.  Measuring is OK.  Will FU with MRI in June.   Some diarrhea lately. Cannot stop worrying.  Triggered by the plumbing issue. Meds as above.  No SE of sig.   Plan:  switched to duloxetine  to 90 mg daily.  Off Lexapro .  Others as noted. Lyrica  100 mg HS. Has held Ritalin  10 TID, can resume as long as SBP below 140 per card.  none needed ropinirole  3 mg pm.  No risperidone .  02/18/23 appt noted:  with [name] Meds: as above. Taking Ritalin  30% of night.  Prn Xanax  rarely if gets off sleep cycle. No SE.   Terribly dep but not as bad as in the past. Taking Ritalin  appears to help significantly and started doing that.  Tend to stay in bed till noon but pattern of up late.   OCD about the same with some avoidance of detail work.  Afraid I'll throw away something I need.   She doesn't know whether Ritalin  helps No sig RLS and  wife agrees. Thinks of deceased B at the holidays but worries over son Deward too.   Plan: Increase duloxetine  to 120 mg daily.  Off Lexapro .  Others as noted. Lyrica  100 mg HS.  resumed Ritalin  30 AM with some benefit. no risperidone .  03/19/23 appt noted: Psych med:  duloxetine  120, Ritalin  20 am  missing doses, Lyrica   100 HS, no risperidone , no ropinirole .   No improvement in depression since increase dose duloxetine .  More dep than usual.  Worrying about everything.  Including taxes.  All I can see is a black hole.  Making him feel negative about everything.   Keeping him from doing things.   OCD is not much different from when on Lexapro .  Wife agrees dep and distracted. Plan: resumed Ritalin  30 AM with some benefit. Resume Abilify  2 mg AM for dep.  04/16/23 appt noted:  wife here Psych med:  duloxetine  120, resumed Concerta  36 mg AM, Lyrica  100 HS, Abilify  2, no ropinirole .   Wife noted he seemed manic yesterday.  Invited window estimate against wife's will and without her input.   No mania noted until yesterday. He didn't feel manic yesterday.   He's noticed no change with Abilify  and still feels flat and a little low.  From MPH energy and mood a little better.  Sleeps until 10-11 am about 12 hours. And asleep that whole time. Wife says he doesn't eat much bc he's not awake much. Trouble with hygiene.   Plan: Meds: continue duloxetine  to 120 mg daily.  Off Lexapro .  Lyrica  100 mg HS.  resumed Ritalin  30 AM with some benefit. DC Abiilify Vraylar  1.5 mg every other day Spravato disc in detail.  He wants to pursue if not better with Vraylar .  06/18/23 appt noted: with W Med:  duloxetine  120, resumed Concerta  36 mg AM, Lyrica  100 HS, no ropinirole .   Vraylar  1.5 every other day, no benefit Spravato denied by insurance but is being appealed. More trouble walking and shorter gait.  Neuro in [place] appt in Sept.   Looking to get in in [place].  Can't be much more dep than I am.   OCD  residual when has to make a decision.   ED is more of a px. Reduced voice family. Plan : Spravato  06/25/23 appt noted: with W Med:  duloxetine  120, Concerta  36 mg AM, Lyrica  100 HS, no ropinirole .   Vraylar  1.5 daily No SE Received Spravato 56 mg today.  Experienced very mild dissociation and no sig HA, N.  But some dizziness.  Resolved by end of 2 hours observation.  Able to leave office without assistance.  Ongoing dep without change.  Slow, reduced cognition, anhedonia, forgetful, low motivation. No other concerns with meds.   06/27/23 appt noted:  Med: Med:  duloxetine  120, Concerta  36 mg AM, Lyrica  100 HS, no ropinirole .   Stopped Vraylar  1.5 daily No SE Received Spravato 84 mg todayfor the first time..  Experienced very mild dissociation and no sig HA, N.  But some dizziness.  Resolved by end of 2 hours observation.  Able to leave office without assistance.  Ongoing dep without change.  Slow, reduced cognition, anhedonia, forgetful, low motivation. No other concerns with meds.   07/02/23 appt oted: Med: Med:  duloxetine  90, Concerta  36 mg AM, Lyrica  100 HS, no ropinirole .   Stopped Vraylar  1.5 daily No SE Received Spravato 84 mg today.  Experienced very mild dissociation and no sig HA, N.  But some dizziness.  Resolved by end of 2 hours observation.  Mildly drowsy at end of session. BC of gait px he needed to use WC to leave the office.  Per wife gait gradually worsened over a couple of years but accelerated recently in 3 mos or so.  Short gait.  Not due to pain.  Pending neuro appt.  MRI last week  not read yet. No problems with meds. Wife noticed he's shown more initiative at home since [place] ex doing crossword puzzles.  More engaged.  He feels lighter already with it.  07/15/23 appt noted:  Med: Med:  duloxetine  90, Concerta  36 mg AM, Lyrica  100 HS, no ropinirole .  No SE Received Spravato 84 mg today.  Experienced very mild dissociation and no sig HA, N.  But some dizziness.   Resolved by end of 2 hours observation.  Mildly drowsy at end of session. BC of gait px he needed to use WC to leave the office. Seen with W.   W noted clear benefit Spravato with stronger voice, spontaneous chores he wasn't doing.  More engaged in coverstation.  Pt agrees. Disc concerns over gait px and his neuro visit and MRI scan suggesting Fahr's DZ.  He'll discuss further with DR. Tat tomorrow.  07/18/23 appt noted:  Med: Med:  duloxetine  90, Concerta  36 mg AM, Lyrica  100 HS, no ropinirole .  No SE Received Spravato 84 mg today.  Experienced very mild dissociation and no sig HA, N.  But some dizziness.  Resolved by end of 2 hours observation.  Mildly drowsy at end of session. BC of gait px he needed to use WC to leave the office. No med concerns.  Seen with W.    Both agree dep is better .  More affective range.  More socially engaged.  Quicker thought.  More active .  Less down and less negative.  07/23/23 appt noted: Med: Med:  duloxetine  90, Concerta  36 mg AM, Lyrica  100 HS, no ropinirole .  No SE Received Spravato 84 mg today.  Experienced very mild dissociation and no sig HA, N.  But some dizziness.  Resolved by end of 2 hours observation.  Mildly drowsy at end of session. BC of gait px he needed to use WC to leave the office. No med concerns.  Seen with W.    Dep is still improved.  But is dealing with poor gait, weak and slow.  Will start neurorehab today.   Anxiety re: OCD manageable. Thought and affect quicker and more responsive .  Humor returned.  07/25/23 appt:  Med: Med:  duloxetine  90, Concerta  36 mg AM, Lyrica  100 HS, no ropinirole .  No SE Received Spravato 84 mg today.  Experienced very mild dissociation and no sig HA, N.  But some dizziness.  Resolved by end of 2 hours observation.  Mildly drowsy at end of session. BC of gait px he needed to use WC to leave the office. No med concerns.  Seen with W.    Overall mood still improving but not 100%.  Starting neurorehab for  weakness.  No new med concerns.  Satisfied with meds.  07/30/23 appt noted:  Med: Med:  duloxetine  90, Concerta  36 mg AM, Lyrica  100 HS, no ropinirole .  No SE Received Spravato 84 mg today.  Experienced very mild dissociation and no sig HA, N.  But some dizziness.  Resolved by end of 2 hours observation.  Mildly drowsy at end of session. BC of gait px he needed to use WC to leave the office. No med concerns.  Seen with W.    Both agree mood is improving and activity and interest.  Not 100% but limited by mobility and starting PT.    08/15/23 appt noted:  Med:  duloxetine  90, Concerta  36 mg AM, Lyrica  100 HS, no ropinirole .  No SE Received Spravato 56 mg today.  Experienced very mild  dissociation and no sig HA, N.  But some dizziness.  Resolved by end of 2 hours observation.  Mildly drowsy at end of session. BC of gait px he needed to use WC to leave the office. No med concerns.  Seen with W.    Less drowsy at  end of time here than when got 84 mg daily. They both agree good response with dep and anxiety.  More involved and better interaction socially.  More initiative.  ECT-MADRS    Flowsheet Row Office Visit from 06/18/2023 in [name] Office Visit from 04/16/2023 in [facility]  MADRS Total Score 37 36   PHQ2-9    Flowsheet Row Office Visit from 03/15/2020 in [facility] Total Score 3  PHQ-9 Total Score 9     B schizophrenic SUI. After M's death. PCP Cecil Ee at [facility] at 73 years old.  Prior psychiatric medication trials include  Lexapro  20, citalopram NR, clomipramine weight gain, paroxetine, fluoxetine, Luvox, Trintellix,   Increase Lexapro  back to 20 mg January 2020. & 10/2020 bupropion ,  Auvelity NR  Abilify  10 fatigue,  Cerefolin NAC, and   Naltrexone  sexual SE Lyrica  150 tired  pramipexole,  ropinirole  Adderall XR & IR  sexual SE,  Ritalin  30, Concerta  72 mg AM NR modafinil  and Nuvigil,   History [employer] OCD  Review of Systems:  Review of Systems  Constitutional:  Positive for fatigue. Negative for fever.  Cardiovascular:  Negative for chest pain and palpitations.  Genitourinary:        ED  Musculoskeletal:  Positive for arthralgias, gait problem and myalgias.  Neurological:  Positive for weakness. Negative for syncope.  Psychiatric/Behavioral:  Positive for dysphoric mood and sleep disturbance. Negative for agitation, behavioral problems, confusion, decreased concentration, hallucinations, self-injury and suicidal ideas. The patient is nervous/anxious. The patient is not hyperactive.     Medications: I have reviewed the patient's current medications.  Current Outpatient Medications  Medication Sig Dispense Refill  . ALPRAZolam  (XANAX ) 0.25 MG tablet TAKE 1 TABLET (0.25 MG TOTAL) BY MOUTH 2 (TWO) TIMES DAILY AS NEEDED FOR ANXIETY OR SLEEP. 30 tablet 1  . aspirin 81 MG tablet Take 81 mg by mouth daily.    . Cholecalciferol (VITAMIN D-3) 5000 units TABS Take 5,000 Units by mouth daily.     . DULoxetine  (CYMBALTA ) 30 MG capsule Take 3 capsules (90 mg total) by mouth daily. 270 capsule 0  . methylphenidate  36 MG PO CR tablet Take 1 tablet (36 mg total) by mouth daily. 30 tablet 0  . naltrexone  (DEPADE) 50 MG tablet Take 0.5 tablets (25 mg total) by mouth daily. 45 tablet 1  . pregabalin  (LYRICA ) 50 MG capsule Take 1 capsule (50 mg total) by mouth at bedtime. 90 capsule 0  . Semaglutide ,0.25 or 0.5MG /DOS, (OZEMPIC , 0.25 OR 0.5 MG/DOSE,) 2 MG/1.5ML SOPN Inject 2.4 mg into the skin once a week.    . simvastatin (ZOCOR) 20 MG tablet Take 20 mg by mouth at bedtime.    . ZEPBOUND  10 MG/0.5ML Pen Inject 10 mg into the skin once a week.     No current facility-administered medications for this visit.    Medication Side Effects: None sexual SE are better not  All gone.  Allergies:  Allergies  Allergen  Reactions  . E.E.S. [Erythromycin] Hives  . Macrolides And Ketolides Other (See Comments)    EES   . Rosuvastatin  Other reaction(s): cramps    Past Medical History:  Diagnosis Date  . Allergy   . Anemia    iron- pt denies   . Anxiety   . Aortic cusp regurgitation   . Carotid artery occlusion   . Constipation   . Coronary artery stenosis   . Hyperlipidemia   . Lactose intolerance   . Major depression, recurrent, chronic (HCC)   . Obesity   . OCD (obsessive compulsive disorder)   . OSA (obstructive sleep apnea)   . Other chronic pain   . Periodic limb movement disorder   . Periodic limb movements of sleep   . Prediabetes   . Pure hypercholesterolemia   . Restless legs   . Sleep apnea    wears cpap   . Vitamin D deficiency     Family History  Problem Relation Age of Onset  . Cancer Mother        breast and ovarian  . Anxiety disorder Mother   . Breast cancer Mother   . Ovarian cancer Mother   . Depression Father        bi-polar  . Hyperlipidemia Father   . Heart disease Father   . Sudden death Father   . Bipolar disorder Father   . Sleep apnea Father   . Obesity Father   . Depression Son   . Colon cancer Neg Hx   . Colon polyps Neg Hx   . Esophageal cancer Neg Hx   . Rectal cancer Neg Hx   . Stomach cancer Neg Hx     Social History   Socioeconomic History  . Marital status: Married    Spouse name: Not on file  . Number of children: Not on file  . Years of education: Not on file  . Highest education level: Not on file  Occupational History  . Occupation: retired Pensions consultant  Tobacco Use  . Smoking status: Never  . Smokeless tobacco: Never  Substance and Sexual Activity  . Alcohol use: Yes    Comment: occasionally   . Drug use: No  . Sexual activity: Not on file  Other Topics Concern  . Not on file  Social History Narrative  . Not on file   Social Drivers of Health   Financial Resource Strain: Not on file  Food Insecurity: No Food  Insecurity (01/25/2022)   Received from [facility] visits prior to 03/24/2022[facility]   Hunger Vital Sign   . Worried About Programme researcher, broadcasting/film/video in the Last Year: Never true   . Ran Out of Food in the Last Year: Never true  Transportation Needs: No Transportation Needs (01/25/2022)   [facility] visits prior to 03/24/2022.   PRAPARE - Transportation   . Lack of Transportation (Medical): No   . Lack of Transportation (Non-Medical): No  Physical Activity: Not on file  Stress: Not on file  Social Connections: Not on file  Intimate Partner Violence: Not on file    Past Medical History, Surgical history, Social history, and Family history were reviewed and updated as appropriate.   Please see review of systems for further details on the patient's review from today.   Objective:   Physical Exam:  There were no vitals taken for this visit.  Physical Exam Constitutional:      General: He is not in acute distress.    Appearance: He is obese.  Musculoskeletal:  General: No deformity.  Neurological:     Mental Status: He is alert and oriented to person, place, and time.     Cranial Nerves: No dysarthria.     Coordination: Coordination abnormal.     Comments: Shortened gait is better.  No sig tremor.  less Slow.  Psychiatric:        Attention and Perception: Attention and perception normal. He does not perceive auditory or visual hallucinations.        Mood and Affect: Mood is anxious and depressed. Affect is not labile, blunt, angry or inappropriate.        Speech: Speech normal.        Behavior: Behavior is slowed. Behavior is cooperative.        Thought Content: Thought content normal. Thought content is not paranoid or delusional. Thought content does not include homicidal or suicidal ideation. Thought content does not include suicidal plan.        Cognition and Memory: Cognition and memory normal.         Judgment: Judgment normal.     Comments: Insight intact Depression 60% better OCD is about the same and not the main problem More expressive and more normal affect.    November 06, 2018: [place] Cog test in office within normal limits MMSE 28/30. Animal fluency 17 . (borderline) Taken as a whole, no indication to pursue neuropsychological testing.  Mini-Mental status exam 28/30 on 10/27/20.  No evidence of dementia.  Lab Review:   Vitamin D level acceptable at 54.5.   Normal B12 and folate and TSH in last couple of years..  Echocardiogram is stable re: AVR over the last 8 years and not likely the cause of lethargy.   06/28/23 MRI head:  IMPRESSION: 1. No acute intracranial abnormality. 2. 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 Head CT may be helpful for further assessment of the degree of mineralization. 3. Findings of chronic small vessel ischemia.  SABRAres Assessment: Plan:    There are no diagnoses linked to this encounter.   Mr. Hartog has a long history of depression and OCD.  Major depression with fatigue, cognitive and physical slowness, anhedonia is much worse.  Started Spravato  and improving.  Was better energy with duloxetine  90 vs Lexapro  so increased to 120 mg daily DT worsening depression.    But it was not better. So reduced it back to 90 mg daily. Dep is much worse, in fact, his worst depression ever. pursue Spravato  He has some compulsive checking and obsessions around the house maintenance.  When travels then tends to have less OCD bc triggered less.    Reduced Spravato to 56 mg bc seemed quite sedated recently with 84 mg .  Not as much at end of session today. The patient experienced the typical dissociation which gradually resolved over the 2-hour period of observation.  There were no complications.  Specifically the patient did not  have nausea or vomiting or headache.  Blood pressures remained within normal ranges at the 40-minute and 2-hour follow-up intervals.  By the time the 2-hour observation period was met the patient was alert and oriented and able to exit without assistance.  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  See nursing note for further details. Emphasized need to relax with Spravato and not return calls, read, return chats, emails etc. Started  Spravato 06/25/23  His OCD is persistent with checking lites, stove, etc. .  It is better at the moment DT depression being worse.  Disc CBT techniques and potential for more therapy to address. Option Dr. Marijean.  Continue  retrial Concerta  36 mg AM He wants to use it for depression and cognitive concerns.  Discussed potential benefits, risks, and side effects of stimulants with patient to include increased heart rate, palpitations, insomnia, increased anxiety, increased irritability, or decreased appetite.  Instructed patient to contact office if experiencing any significant tolerability issues.  Extensive discussion of sleep study and he has a copy.  Why is there virtually no N3 & REM sleep?  Is that affecting daytime alertness and fatigue?  Vs how much is related to mild OSA and PLMS?  Disc this in detail.  Wrote coreespondence with sleep doc about it.  Options for sleep & fatigue:  Difficult to assess  bc has been out of country and then sick for 3 weeks.   Focus on reduction in OSA Focus on improving deep stage sleep.  ? Rx low dose mirtazapine or alternatives Focus on leg movements (hx RLS/PLMS) continue Trial as had been suggested Lyrica  for FM type sx and may help RLS/PLMS.  Better dreaming on 100 mg HS and too sleepy on 150.  Decreased  to 100 mg HS for sleep and back pain and RLS No ropinirole  needed.   Disc SE.  Not having any.   Use LED Xanax  and try to avoid  BZ daytime bc fatigue.  He doesn't use it much daythime.  Using 0..25-0.5 mg at night.  May need less with more Lyrica  at HS and try to minimize.  No hangover. We discussed the short-term risks associated with benzodiazepines including sedation and increased fall risk among others.  Discussed long-term side effect risk including dependence, potential withdrawal symptoms, and the potential eventual dose-related risk of dementia.  But recent studies from 2020 dispute this association between benzodiazepines and dementia risk. Newer studies in 2020 do not support an association with dementia.  Stimulant partially successfully used off label to augment antidepressants for depression and have resulted in improved productivity and attention.    Previous screening of memory was not suggestive of any neuro degenerative process: Mini-Mental status exam 28/30 on 10/27/20.   Recent cognition worse as dep worsened.  Also gait is short and slow.  Pending neuro appt.  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  Plan:  no med changes.   continue duloxetine  to 120 mg daily.  Off Lexapro .  Lyrica  100 mg HS.  resumed MPH ER 36 mg AM   Spravato disc in detail.  He wants to continue.  He and wife notice he's better.  Administer now drop back to weekly  He's still improving.  Will administer at 56 mg .   Follow-up   Lorene Macintosh MD, DFAPA.  Please see After Visit Summary for patient specific instructions.  Future Appointments  Date Time Provider Department Center  08/21/2023  8:00 AM Annett Sheffield SAILOR, [place] [facility]  08/27/2023  9:00 AM Cottle, Lorene KANDICE Raddle., MD [facility] None  08/27/2023  9:00 AM CP-NURSE [facility] None  08/28/2023 12:30 PM Plaster, Marlon BRAVO, PT [facility]  08/30/2023 12:30 PM Plaster, Marlon BRAVO, PT [facility]  09/04/2023 12:30 PM Annett Sheffield SAILOR, PT [facility]  09/06/2023 12:30 PM Annett Sheffield SAILOR, PT [facility]  09/09/2023  9:30 AM Annett Sheffield SAILOR, PT [facility]  09/17/2023   1:00 PM  Cottle, Lorene KANDICE Raddle., MD [facility] None  10/15/2023  1:30 PM Cottle, Lorene KANDICE Raddle., MD [facility] None  01/30/2024  3:00 PM Tat, Asberry RAMAN, DO [facility] None     No orders of the defined types were placed in this encounter.      -------------------------------

## 2023-08-21 ENCOUNTER — Encounter: Payer: Self-pay | Admitting: Physical Therapy

## 2023-08-21 ENCOUNTER — Ambulatory Visit: Admitting: Physical Therapy

## 2023-08-21 ENCOUNTER — Encounter: Payer: Self-pay | Admitting: Psychiatry

## 2023-08-21 DIAGNOSIS — R2681 Unsteadiness on feet: Secondary | ICD-10-CM | POA: Diagnosis not present

## 2023-08-21 DIAGNOSIS — G20C Parkinsonism, unspecified: Secondary | ICD-10-CM | POA: Diagnosis not present

## 2023-08-21 DIAGNOSIS — M6281 Muscle weakness (generalized): Secondary | ICD-10-CM

## 2023-08-21 DIAGNOSIS — R2689 Other abnormalities of gait and mobility: Secondary | ICD-10-CM

## 2023-08-21 DIAGNOSIS — R293 Abnormal posture: Secondary | ICD-10-CM

## 2023-08-21 NOTE — Therapy (Signed)
 OUTPATIENT PHYSICAL THERAPY NEURO TREATMENT/10th VISIT PN   Patient Name: Eric [name] MRN: [id number] DOB:[birth year], 73 y.o., male Today's Date: 08/21/2023   PCP: Regino Slater, MD   REFERRING PROVIDER: Evonnie Asberry RAMAN, DO  10th Visit Physical Therapy Progress Note  Dates of Reporting Period: 07/23/23 to 08/21/23   END OF SESSION:  PT End of Session - 08/21/23 0809     Visit Number 9    Number of Visits 13    Date for PT Re-Evaluation 09/21/23    Authorization Type [employer]    Progress Note Due on Visit 9   performed on visit 9   PT Start Time 0807   pt late to session   PT Stop Time 0845    PT Time Calculation (min) 38 min    Equipment Utilized During Treatment Gait belt    Activity Tolerance Patient tolerated treatment well    Behavior During Therapy WFL for tasks assessed/performed;Flat affect          Past Medical History:  Diagnosis Date   Allergy    Anemia    iron- pt denies    Anxiety    Aortic cusp regurgitation    Carotid artery occlusion    Constipation    Coronary artery stenosis    Hyperlipidemia    Lactose intolerance    Major depression, recurrent, chronic (HCC)    Obesity    OCD (obsessive compulsive disorder)    OSA (obstructive sleep apnea)    Other chronic pain    Periodic limb movement disorder    Periodic limb movements of sleep    Prediabetes    Pure hypercholesterolemia    Restless legs    Sleep apnea    wears cpap    Vitamin D deficiency    Past Surgical History:  Procedure Laterality Date   COLONOSCOPY     DG BARIUM ENEMA ([facility] HX)     10-19-2010- turtous, elongated colon    HEMORRHOID SURGERY     TONSILLECTOMY  1960   Patient Active Problem List   Diagnosis Date Noted   Vitamin D deficiency 03/30/2020   Pure hypercholesterolemia 03/30/2020   Prediabetes 03/30/2020   Occlusion and stenosis of bilateral carotid arteries 03/30/2020   Obstructive sleep apnea syndrome 03/30/2020   Aortic  cusp regurgitation 03/30/2020   Restless legs syndrome 03/30/2020   Moderate recurrent major depression (HCC) 03/30/2020   Chronic pain 03/30/2020   OCD (obsessive compulsive disorder) 10/15/2017   Family history of ovarian cancer 04/19/2011   Family history of breast cancer 04/19/2011    ONSET DATE: 07/12/2023   REFERRING DIAG: G20.C (ICD-10-CM) - Primary parkinsonism (HCC)   THERAPY DIAG:  Unsteadiness on feet  Other abnormalities of gait and mobility  Abnormal posture  Muscle weakness (generalized)  Rationale for Evaluation and Treatment: Rehabilitation  SUBJECTIVE:  SUBJECTIVE STATEMENT: No falls, just had a stumble when getting out of bed.   Pt accompanied by: Self    PERTINENT HISTORY: PMH: Parkinsonism (Possibly due to exposure to antipsychotic medication), tardive dyskinesias, restless leg syndrome, CAD, pre-diabetes, obesity, OCD, major depression,HLD  He was not exposed to anything for very long, but wife reports that symptoms have really been developing over the last 5 to 6 weeks. He was on Abilify  for a very short time in February and March and then was on Vraylar  from March until June. The symptoms developed around that time. He is off of the Vraylar  now (just went off of it)   Per Dr. Evonnie regarding abnormal brain scan:  It does appear that there is heavy basal ganglia calcification.  We can certainly confirm this with CT.  While Fahrs disease is on the differential, the timeline for his symptoms really correlates more with the timeline for when he was placed on antipsychotic medication.   PAIN:  Are you having pain? No  PRECAUTIONS: Fall  FALLS: Has patient fallen in last 6 months? No and almost falls grabbing the wall   LIVING ENVIRONMENT: Lives with: lives with their  spouse Lives in: House/apartment Stairs: Yes: Internal: 12 steps; on left going up and External: 4 and 5 steps; none Has following equipment at home: Single point cane and Grab bars  PLOF: Independent and Leisure: doesn't have any hobbies except cleaning up the house  PATIENT GOALS: Wants to get back to his regular gait and his regular walk so he can go on trips   OBJECTIVE:  Note: Objective measures were completed at Evaluation unless otherwise noted.  DIAGNOSTIC FINDINGS: MRI brain: IMPRESSION: 1. No acute intracranial abnormality. 2. 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 Head CT may be helpful for further assessment of the degree of mineralization. 3. Findings of chronic small vessel ischemia.  COGNITION: Overall cognitive status: Within functional limits for tasks assessed   SENSATION: WFL  COORDINATION: Heel to shin: some bradykinesia RLE RAM: dysmetric BUE     POSTURE: rounded shoulders, forward head, and posterior pelvic tilt   LOWER EXTREMITY MMT:    MMT Right Eval Left Eval  Hip flexion 3- 4  Hip extension    Hip abduction    Hip adduction    Hip internal rotation    Hip external rotation    Knee flexion 4 5  Knee extension 4 4  Ankle dorsiflexion 5 5  Ankle plantarflexion    Ankle inversion    Ankle eversion    (Blank rows = not tested)  BED MOBILITY:   Pt reports some difficulties getting under the sheets    TRANSFERS: Sit to stand: Modified independence  Assistive device utilized: None     Stand to sit: Modified independence  Assistive device utilized: None     Pt reports difficulties getting up from lower surfaces or in and out of the car.   GAIT: Findings: Gait Characteristics: decreased arm swing- Right, decreased arm swing- Left, decreased step length- Right, decreased step length- Left, decreased stride length,  decreased hip/knee flexion- Right, decreased hip/knee flexion- Left, decreased ankle dorsiflexion- Right, decreased ankle dorsiflexion- Left, Right foot flat, Left foot flat, shuffling, decreased trunk rotation, trunk flexed, and narrow BOS, Distance walked: Clinic distances, Assistive device utilized:Single point cane and None, Level of assistance: CGA, and Comments: Pt with very shuffled steps. When pt gets single HHA from therapist  for a couple steps, pt able to demo improved step length   FUNCTIONAL TESTS:  5 times sit to stand: 15.6 seconds with no UE support, incr forward flexed posture in standing  Timed up and go (TUG): 27.4 seconds with no AD, CGA with en bloc turning 10 meter walk test: 62 seconds with no AD and CGA = .52 ft/sec                                                                                                                                TREATMENT DATE: 08/21/23  Therapeutic Activity:   Continued to educate on proper steps to take when going to sit using the rollator (i.e. backing up all the way to the mat table and locking rollator brakes prior to sitting). And also cues to make sure he pushes up from the chair/mat table instead of rollator SciFit with BUE/BLE Multi-peaks at gear 6.0 > 7.0 for 8 minutes to work on neural priming, incr amplitude of stepping, reciprocal movement patterns. Needs cues intermittently to maintain larger amplitude movements. Pt reporting RPE as 4-5/10.   Gait speed with rollator: 34.4 seconds = .95 ft/sec pt reports he is moving slower today as he is not used to coming this early in the morning  5x sit <> stand with no UE support: 14.2 seconds, 11.3 seconds   Pt consistently uses rollator when coming to PT, but reports that he does not use it in the house as he has furniture that he can hold onto instead. Discussed pt's fall risk and gait mechanics with very shuffled steps that he should be using a rollator at all times, esp if he is furniture  walking inside the house. Pt notes that he won't be able to use it upstairs, but can use it downstairs. Also educated that taking bigger steps helps with balance and energy efficiency compared to small shuffled steps   NMR: During session when ambulating with rollator, cues for heel strike and incr stride length, esp with RLE. Pt can perform for a few steps, but does revert to more shuffled pattern.    Next to ballet bar: With 4 larger orange obstacles:performed alternating step overs for incr stride length, foot clearance, hip/knee flexion, down and back x6 reps, pt initially frequently hitting obstacles with RLE and knocking them over, but did improve with incr reps, performing with UE > fingertip support  With 1 smaller orange obstacle: alternating forward step overs and returning back to midline 15 reps each leg, beginning with UE support > none, pt improved with incr amplitude of stepping with incr reps  Following multi-directional stepping sheet with stepping over 2 obstacles in forward/lateral direction for improved foot clearance/weight shifting/step height  Performed 4 reps with naming directions out loud, pt with difficulty with fully stepping over forward obstacle    PATIENT EDUCATION: Education details: Continue HEP, making sure rollator brakes are locked before standing, fall risk and using rollator  as often as pt is able to  Person educated: Patient Education method: Medical illustrator Education comprehension: verbalized understanding, returned demonstration, verbal cues required, and needs further education  HOME EXERCISE PROGRAM: Standing PWR Moves   Access Code: [email or web address] URL: [email or web address] Date: 08/19/2023 Prepared by: Sheffield Senate  Exercises - Standing Marching  - 1-2 x daily - 5 x weekly - 4 sets  GOALS: Goals reviewed with patient? Yes  SHORT TERM GOALS: Target date: 08/13/2023   1. Pt will be independent with initial HEP with  gait, balance, strength in order to build upon functional gains made in therapy. Baseline: pt reports doing standing PWR moves every other day Goal status: PARTIALLY MET  2.  Pt will improve TUG time to 22 seconds or less with no AD vs. LRAD in order to demo decrease fall risk. Baseline: 27.4 seconds with no AD, CGA  16.6 seconds with no AD SBA/CGA (7/23) Goal status: MET  3.  Pt will improve gait speed with LRAD to at least 1.3 ft/sec in order to demo decr fall risk/improved household mobility.  Baseline: 62 seconds with no AD = .52 ft/sec and CGA   21.7 seconds with rollator = 1.51 ft/sec Goal status: MET  4.  Pt will ambulate at least 230' with supervision with LRAD over level indoor surfaces in order to demo improved household mobility.  Baseline: pt ambulates with no AD with CGA   Ambulates 230' with supervision and rollator  Goal status: MET   LONG TERM GOALS: Target date: 09/03/2023   Pt will be independent with final HEP with gait, balance, strength in order to build upon functional gains made in therapy. Baseline:  Goal status: INITIAL  2.  Pt will improve TUG time to 13.5 seconds or less with no AD vs. LRAD in order to demo decrease fall risk. Baseline: 27.4 seconds with no AD, CGA  16.6 seconds with no AD SBA/CGA (7/23) Goal status: REVISED  3.  Pt will improve 5x sit<>stand to less than or equal to 13 sec to demonstrate improved functional strength and transfer efficiency.  Baseline: 15.6 seconds with no UE support, incr forward flexed posture in standing  11.3 seconds with no UE support (7/30) Goal status: MET  4.  Pt will improve gait speed with LRAD to at least 1.8 ft/sec in order to demo decr fall risk  Baseline: 62 seconds with no AD = .52 ft/sec and CGA  Gait speed with rollator: 34.4 seconds = .95 ft/sec (7/30) Goal status: IN PROGRESS  5.  Pt will ambulate at least 500' with supervision with LRAD over outdoor unlevel surfaces in order to demo  improved community mobility.  Baseline:  Goal status: INITIAL    ASSESSMENT:  CLINICAL IMPRESSION: Session limited today due to pt arriving late. 10th visit PN: STGs assessed last week with pt meeting all STGs. Assessed gait speed again today with rollator, with pt having a decr gait speed compared to when assessed last week. Pt reporting slowed gait speed today as this session was much earlier in the morning at 8 AM. Pt met LTG #3 in regards to sit <> stands. Pt able to perform in 11.3 seconds (previously 15.6 seconds). Educated on pt's fall risk and importance of using rollator for decr fall risk/improving gait efficiency as pt's steps still remain very shuffled. Today's session focused on obstacles and stepping for improved foot clearance/step height, esp with RLE. Pt initially having more difficulty clearing obstacles, but does incr with more reps. Continue  POC.  OBJECTIVE IMPAIRMENTS: Abnormal gait, decreased activity tolerance, decreased balance, decreased coordination, decreased knowledge of condition, decreased knowledge of use of DME, decreased mobility, difficulty walking, decreased strength, decreased safety awareness, impaired flexibility, and postural dysfunction.   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   REHAB POTENTIAL: Good  CLINICAL DECISION MAKING: Evolving/moderate complexity  EVALUATION COMPLEXITY: Moderate  PLAN:  PT FREQUENCY: 2x/week  PT DURATION: 8 weeks  PLANNED INTERVENTIONS: 97164- PT Re-evaluation,  97110-Therapeutic exercises, 97530- Therapeutic activity, 97112- Neuromuscular re-education, 97535- Self Care, [id number]- Manual therapy, [phone]- Gait training, Patient/Family education, Balance training, Stair training, and DME instructions  PLAN FOR NEXT SESSION:   warm up on SciFit, work on incr stride length, foot clearance tasks; balance tasks working on larger amplitude movements posture and trunk rotation, speed of movement, cog dual tasking    [employer], PT,DPT 08/21/2023, 8:48 AM

## 2023-08-22 ENCOUNTER — Encounter: Admitting: Behavioral Health

## 2023-08-22 ENCOUNTER — Encounter

## 2023-08-23 DIAGNOSIS — E7800 Pure hypercholesterolemia, unspecified: Secondary | ICD-10-CM | POA: Diagnosis not present

## 2023-08-23 DIAGNOSIS — Z6835 Body mass index (BMI) 35.0-35.9, adult: Secondary | ICD-10-CM | POA: Diagnosis not present

## 2023-08-23 DIAGNOSIS — G4710 Hypersomnia, unspecified: Secondary | ICD-10-CM | POA: Diagnosis not present

## 2023-08-23 DIAGNOSIS — G4733 Obstructive sleep apnea (adult) (pediatric): Secondary | ICD-10-CM | POA: Diagnosis not present

## 2023-08-23 DIAGNOSIS — R531 Weakness: Secondary | ICD-10-CM | POA: Diagnosis not present

## 2023-08-26 DIAGNOSIS — G4733 Obstructive sleep apnea (adult) (pediatric): Secondary | ICD-10-CM | POA: Diagnosis not present

## 2023-08-27 ENCOUNTER — Ambulatory Visit

## 2023-08-27 ENCOUNTER — Encounter: Payer: Self-pay | Admitting: Psychiatry

## 2023-08-27 ENCOUNTER — Ambulatory Visit: Admitting: Psychiatry

## 2023-08-27 VITALS — BP 136/70 | HR 64

## 2023-08-27 DIAGNOSIS — F339 Major depressive disorder, recurrent, unspecified: Secondary | ICD-10-CM

## 2023-08-27 DIAGNOSIS — F5221 Male erectile disorder: Secondary | ICD-10-CM

## 2023-08-27 DIAGNOSIS — R7989 Other specified abnormal findings of blood chemistry: Secondary | ICD-10-CM

## 2023-08-27 DIAGNOSIS — G3184 Mild cognitive impairment, so stated: Secondary | ICD-10-CM

## 2023-08-27 DIAGNOSIS — F900 Attention-deficit hyperactivity disorder, predominantly inattentive type: Secondary | ICD-10-CM

## 2023-08-27 DIAGNOSIS — G4710 Hypersomnia, unspecified: Secondary | ICD-10-CM

## 2023-08-27 DIAGNOSIS — G2581 Restless legs syndrome: Secondary | ICD-10-CM

## 2023-08-27 DIAGNOSIS — F422 Mixed obsessional thoughts and acts: Secondary | ICD-10-CM

## 2023-08-27 MED ORDER — METHYLPHENIDATE HCL 10 MG PO TABS: 30.0000 mg | ORAL_TABLET | Freq: Every day | ORAL | 0 refills | Status: DC | PRN

## 2023-08-27 NOTE — Progress Notes (Signed)
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 poor sleep. Plan  no changes  01/21/23 appt noted: Worrying too much bc OCD.  Trying to decide about how to deal with a trigger lately.  OCD driving me crazy wanting to make things perfect.   Other than the trigger had a nice time since here.  GS here for 5 days at 73 yo.  Enjoyed that.   Taking semaglutide   down to 250# from 283#. Card at [facility] says he does not have Aortic aneurysm.  Measuring is OK.  Will FU with MRI in June.   Some diarrhea lately. Cannot stop worrying.  Triggered by the plumbing issue. Meds as above.  No SE of sig.   Plan:  switched to duloxetine  to 90 mg daily.  Off Lexapro .  Others as noted. Lyrica  100 mg HS. Has held Ritalin  10 TID, can resume as long as SBP below 140 per card.  none needed ropinirole  3 mg pm.  No risperidone .  02/18/23 appt noted:  with Eric [name] Meds: as above. Taking Ritalin  30% of night.  Prn Xanax  rarely if gets off sleep cycle. No SE.   Terribly dep but not as bad as in the past. Taking Ritalin  appears to help significantly and started doing that.  Tend to stay in bed till noon but pattern of up late.   OCD about the same with some avoidance of detail work.   Afraid I'll throw away something I need.   She doesn't know whether Ritalin  helps No sig RLS and wife agrees. Thinks of deceased B at the holidays but worries over son Deward too.   Plan: Increase duloxetine  to 120 mg daily.  Off Lexapro .  Others as noted. Lyrica  100 mg HS.  resumed Ritalin  30 AM with some benefit. no risperidone .  03/19/23 appt noted: Psych med:  duloxetine  120, Ritalin  20 am  missing doses, Lyrica  100 HS, no risperidone , no ropinirole .   No improvement in depression since increase dose duloxetine .  More dep than usual.  Worrying about everything.  Including taxes.  All I can see is a black hole.  Making him feel negative about everything.   Keeping him from doing things.   OCD is not much different from when on Lexapro .  Wife agrees dep and distracted. Plan: resumed Ritalin  30 AM with some benefit. Resume Abilify  2 mg AM for dep.  04/16/23 appt noted:  wife here Psych med:  duloxetine  120, resumed Concerta  36 mg AM, Lyrica  100 HS, Abilify  2, no ropinirole .   Wife noted he seemed manic yesterday.  Invited window estimate against wife's will and without her input.   No mania noted until yesterday. He didn't feel manic yesterday.   He's noticed no change with Abilify  and still feels flat and a little low.  From MPH energy and mood a little better.  Sleeps until 10-11 am about 12 hours. And asleep that whole time. Wife says he doesn't eat much bc he's not awake much. Trouble with hygiene.   Plan: Meds: continue duloxetine  to 120 mg daily.  Off Lexapro .  Lyrica  100 mg HS.  resumed Ritalin  30 AM with some benefit. DC Abiilify Vraylar  1.5 mg every other day Spravato disc in detail.  He wants to pursue if not better with Vraylar .  06/18/23 appt noted: with W Med:  duloxetine  120, resumed Concerta  36 mg AM, Lyrica  100 HS, no ropinirole .   Vraylar  1.5 every other day, no benefit Spravato denied by insurance but is being appealed. More trouble walking and shorter gait.  Neuro  in [place] appt in Sept.   Looking to get in in [place].  Can't be much more dep than I am.   OCD residual when has to make a decision.   ED is more of a px. Reduced voice family. Plan : Spravato  06/25/23 appt noted: with W Med:  duloxetine  120, Concerta  36 mg AM, Lyrica  100 HS, no ropinirole .   Vraylar  1.5 daily No SE Received Spravato 56 mg today.  Experienced very mild dissociation and no sig HA, N.  But some dizziness.  Resolved by end of 2 hours observation.  Able to leave office without assistance.  Ongoing dep without change.  Slow, reduced cognition, anhedonia, forgetful, low motivation. No other concerns with meds.   06/27/23 appt noted:  Med: Med:  duloxetine  120, Concerta  36 mg AM, Lyrica  100 HS, no ropinirole .   Stopped Vraylar  1.5 daily No SE Received Spravato 84 mg todayfor the first time..  Experienced very mild dissociation and no sig HA, N.  But some dizziness.  Resolved by end of 2 hours observation.  Able to leave office without assistance.  Ongoing dep without change.  Slow, reduced cognition, anhedonia, forgetful, low motivation. No other concerns with meds.   07/02/23 appt oted: Med: Med:  duloxetine  90, Concerta  36 mg AM, Lyrica  100 HS, no ropinirole .   Stopped Vraylar  1.5 daily No SE Received Spravato 84 mg today.  Experienced very mild dissociation and no sig HA, N.  But some dizziness.  Resolved by end of 2 hours observation.  Mildly drowsy at end of session. BC of gait px he needed to use WC to leave the office.  Per wife gait gradually worsened over a couple of years but accelerated recently in 3 mos or so.  Short gait.  Not due to pain.  Pending neuro appt.  MRI last week not read yet. No problems with meds. Wife noticed he's shown more initiative at home since [place] ex doing crossword puzzles.  More engaged.  He feels lighter already with it.  07/15/23 appt noted:  Med: Med:  duloxetine  90, Concerta  36 mg AM, Lyrica  100 HS, no ropinirole .  No SE Received Spravato  84 mg today.  Experienced very mild dissociation and no sig HA, N.  But some dizziness.  Resolved by end of 2 hours observation.  Mildly drowsy at end of session. BC of gait px he needed to use WC to leave the office. Seen with W.   W noted clear benefit Spravato with stronger voice, spontaneous chores he wasn't doing.  More engaged in coverstation.  Pt agrees. Disc concerns over gait px and his neuro visit and MRI scan suggesting Fahr's DZ.  He'll discuss further with DR. Tat tomorrow.  07/18/23 appt noted:  Med: Med:  duloxetine  90, Concerta  36 mg AM, Lyrica  100 HS, no ropinirole .  No SE Received Spravato 84 mg today.  Experienced very mild dissociation and no sig HA, N.  But some dizziness.  Resolved by end of 2 hours observation.  Mildly drowsy at end of session. BC of gait px he needed to use WC to leave the office. No med concerns.  Seen with W.    Both agree dep is better .  More affective range.  More socially engaged.  Quicker thought.  More active .  Less down and less negative.  07/23/23 appt noted: Med: Med:  duloxetine  90, Concerta  36 mg AM, Lyrica  100 HS, no ropinirole .  No SE Received Spravato 84 mg today.  Experienced very mild dissociation and no sig HA, N.  But some dizziness.  Resolved by end of 2 hours observation.  Mildly drowsy at end of session. BC of gait px he needed to use WC to leave the office. No med concerns.  Seen with W.    Dep is still improved.  But is dealing with poor gait, weak and slow.  Will start neurorehab today.   Anxiety re: OCD manageable. Thought and affect quicker and more responsive .  Humor returned.  07/25/23 appt:  Med: Med:  duloxetine  90, Concerta  36 mg AM, Lyrica  100 HS, no ropinirole .  No SE Received Spravato 84 mg today.  Experienced very mild dissociation and no sig HA, N.  But some dizziness.  Resolved by end of 2 hours observation.  Mildly drowsy at end of session. BC of gait px he needed to use WC to leave the office. No med  concerns.  Seen with W.    Overall mood still improving but not 100%.  Starting neurorehab for weakness.  No new med concerns.  Satisfied with meds.  07/30/23 appt noted:  Med: Med:  duloxetine  90, Concerta  36 mg AM, Lyrica  100 HS, no ropinirole .  No SE Received Spravato 84 mg today.  Experienced very mild dissociation and no sig HA, N.  But some dizziness.  Resolved by end of 2 hours observation.  Mildly drowsy at end of session. BC of gait px he needed to use WC to leave the office. No med concerns.  Seen with W.    Both agree mood is improving and activity and interest.  Not 100% but limited by mobility and starting PT.    08/15/23 appt noted:  Med:  duloxetine  90, Concerta  36 mg AM, Lyrica  100  HS, no ropinirole .  No SE Received Spravato 56 mg today.  Experienced very mild dissociation and no sig HA, N.  But some dizziness.  Resolved by end of 2 hours observation.  Mildly drowsy at end of session.  But less than when on 84 mg. BC of gait px he needed to use WC to leave the office. No med concerns.  Seen with W.    Less drowsy at  end of time here than when got 84 mg daily. They both agree good response with dep and anxiety.  More involved and better interaction socially.  More initiative.  08/20/23 appt noted:  Med:  duloxetine  90, Concerta  36 mg AM, Lyrica  100 HS, no ropinirole .  No SE Received Spravato 56 mg today.  Experienced very mild dissociation and no sig HA, N.  But some dizziness.  Resolved by end of 2 hours observation.  Mildly drowsy at end of session.  But less than when on 84 mg. BC of gait px he needed to use WC to leave the office. No med concerns.  Seen with W.   She's still concerned he sleeps too much.  He's not sure why he does so.  Overall mood, initiative, socialization is better. Less drowsy at  end of time here than when got 84 mg daily.  08/27/23 appt noted:  Med:  duloxetine  90, Concerta  36 mg AM, Lyrica  100 HS, no ropinirole .  No SE Received Spravato 56 mg  today.  Experienced very mild dissociation and no sig HA, N.  But some dizziness.  Resolved by end of 2 hours observation.  Mildly drowsy at end of session.  But less than when on 84 mg. Walking is some better with PT.  Wife agrees.  He's more interested, responsive, engaged.  Better cognition. After Spravato 56 he does not have to nap when goes home. After 84 mg needed to nap but he wants to increase back to 84 mg to get max effect.   ECT-MADRS    Flowsheet Row Office Visit from 06/18/2023 in [facility] Office Visit from 04/16/2023 in [facility]  MADRS Total Score 37 36   PHQ2-9    Flowsheet Row Office Visit from 03/15/2020 in [facility] Total Score 3  PHQ-9 Total Score 9     B schizophrenic SUI. After M's death. PCP Cecil Ee at [facility] at 73 years old.  Prior psychiatric medication trials include  Lexapro  20, citalopram NR, clomipramine weight gain, paroxetine, fluoxetine, Luvox, Trintellix,   Increase Lexapro  back to 20 mg January 2020. & 10/2020 bupropion ,  Auvelity NR  Abilify  10 fatigue,  Cerefolin NAC, and   Naltrexone  sexual SE Lyrica  150 tired  pramipexole,  ropinirole  Adderall XR & IR sexual SE,  Ritalin  30, Concerta  72 mg AM NR modafinil  and Nuvigil,   History [employer] OCD  Review of Systems:  Review of Systems  Constitutional:  Positive for fatigue. Negative for fever.  Cardiovascular:  Negative for chest pain and palpitations.  Genitourinary:        ED  Musculoskeletal:  Positive for arthralgias, gait problem and myalgias.  Neurological:  Positive for weakness. Negative for syncope.  Psychiatric/Behavioral:  Positive for dysphoric mood and sleep disturbance. Negative for agitation, behavioral problems, confusion, decreased concentration, hallucinations, self-injury and suicidal ideas. The patient is nervous/anxious.  The patient is not hyperactive.     Medications: I have reviewed the patient's  current medications.  Current Outpatient Medications  Medication Sig Dispense Refill   ALPRAZolam  (XANAX ) 0.25 MG tablet TAKE 1 TABLET (0.25 MG TOTAL) BY MOUTH 2 (TWO) TIMES DAILY AS NEEDED FOR ANXIETY OR SLEEP. 30 tablet 1   aspirin 81 MG tablet Take 81 mg by mouth daily.     Cholecalciferol (VITAMIN D-3) 5000 units TABS Take 5,000 Units by mouth daily.      DULoxetine  (CYMBALTA ) 30 MG capsule Take 3 capsules (90 mg total) by mouth daily. 270 capsule 0   methylphenidate  36 MG PO CR tablet Take 1 tablet (36 mg total) by mouth daily. 30 tablet 0   naltrexone  (DEPADE) 50 MG tablet Take 0.5 tablets (25 mg total) by mouth daily. 45 tablet 1   pregabalin  (LYRICA ) 50 MG capsule Take 1 capsule (50 mg total) by mouth at bedtime. 90 capsule 0   simvastatin (ZOCOR) 20 MG tablet Take 20 mg by mouth at bedtime.     ZEPBOUND  10 MG/0.5ML Pen Inject 10 mg into the skin once a week.     methylphenidate  (RITALIN ) 10 MG tablet Take 3 tablets (30 mg total) by mouth daily as needed. 60 tablet 0   No current facility-administered medications for this visit.    Medication Side Effects: None sexual SE are better not  All gone.  Allergies:  Allergies  Allergen Reactions   E.E.S. [Erythromycin] Hives   Macrolides And Ketolides Other (See Comments)    EES    Rosuvastatin     Other reaction(s): cramps    Past Medical History:  Diagnosis Date   Allergy    Anemia    iron- pt denies    Anxiety    Aortic cusp regurgitation    Carotid artery occlusion    Constipation    Coronary artery stenosis    Hyperlipidemia    Lactose intolerance    Major depression, recurrent, chronic (HCC)    Obesity    OCD (obsessive compulsive disorder)    OSA (obstructive sleep apnea)    Other chronic pain    Periodic limb movement disorder    Periodic limb movements of sleep    Prediabetes    Pure hypercholesterolemia    Restless legs     Sleep apnea    wears cpap    Vitamin D deficiency     Family History  Problem Relation Age of Onset   Cancer Mother        breast and ovarian   Anxiety disorder Mother    Breast cancer Mother    Ovarian cancer Mother    Depression Father        bi-polar   Hyperlipidemia Father    Heart disease Father    Sudden death Father    Bipolar disorder Father    Sleep apnea Father    Obesity Father    Depression Son    Colon cancer Neg Hx    Colon polyps Neg Hx    Esophageal cancer Neg Hx    Rectal cancer Neg Hx    Stomach cancer Neg Hx     Social History   Socioeconomic History   Marital status: Married    Spouse name: Not on file   Number of children: Not on file   Years of education: Not on file   Highest education level: Not on file  Occupational History   Occupation: retired Pensions consultant  Tobacco Use   Smoking status: Never   Smokeless tobacco: Never  Substance and Sexual Activity  Alcohol use: Yes    Comment: occasionally    Drug use: No   Sexual activity: Not on file  Other Topics Concern   Not on file  Social History Narrative   Not on file   Social Drivers of Health   Financial Resource Strain: Not on file  Food Insecurity: No Food Insecurity (01/25/2022)   Received from [facility] visits prior to 03/24/2022., [facility]   Hunger Vital Sign    Within the past 12 months, you worried that your food would run out before you got the money to buy more.: Never true    Within the past 12 months, the food you bought just didn't last and you didn't have money to get more.: Never true  Transportation Needs: No Transportation Needs (01/25/2022)   Received from [facility], [facility] visits prior to 03/24/2022.   PRAPARE - Administrator, Civil Service (Medical): No    Lack of Transportation (Non-Medical): No  Physical Activity: Not on file  Stress: Not on file  Social Connections: Not on file  Intimate Partner  Violence: Not on file    Past Medical History, Surgical history, Social history, and Family history were reviewed and updated as appropriate.   Please see review of systems for further details on the patient's review from today.   Objective:   Physical Exam:  There were no vitals taken for this visit.  Physical Exam Constitutional:      General: He is not in acute distress.    Appearance: He is obese.  Musculoskeletal:        General: No deformity.  Neurological:     Mental Status: He is alert and oriented to person, place, and time.     Cranial Nerves: No dysarthria.     Coordination: Coordination abnormal.     Comments: Shortened gait is better.  No sig tremor.  less Slow but still not normal.  Psychiatric:        Attention and Perception: Attention and perception normal. He does not perceive auditory or visual hallucinations.        Mood and Affect: Mood is anxious and depressed. Affect is not labile, blunt, angry or inappropriate.        Speech: Speech normal.        Behavior: Behavior is not slowed. Behavior is cooperative.        Thought Content: Thought content normal. Thought content is not paranoid or delusional. Thought content does not include homicidal or suicidal ideation. Thought content does not include suicidal plan.        Cognition and Memory: Cognition and memory normal.        Judgment: Judgment normal.     Comments: Insight intact Depression 60% better OCD is about the same and not the main problem More expressive and more normal affect.  Quicker responses.    November 06, 2018: [place] Cog test in office within normal limits MMSE 28/30. Animal fluency 17 . (borderline) Taken as a whole, no indication to pursue neuropsychological testing.  Mini-Mental status exam 28/30 on 10/27/20.  No evidence of dementia.  Lab Review:   Vitamin D level acceptable at 54.5.   Normal B12 and folate and TSH in last couple of years..  Echocardiogram is stable re: AVR  over the last 8 years and not likely the cause of lethargy.   06/28/23 MRI head:  IMPRESSION: 1. No acute intracranial abnormality. 2. 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 Head CT may be helpful for further assessment of the degree of mineralization. 3. Findings of chronic small vessel ischemia.  SABRAres Assessment: Plan:    Gwynn was seen today for follow-up, depression, anxiety, fatigue and add.  Diagnoses and all orders for this visit:  Recurrent major depression resistant to treatment [facility])  Mixed obsessional thoughts and acts  Attention deficit hyperactivity disorder (ADHD), predominantly inattentive type  Hypersomnia with sleep apnea  Mild cognitive impairment  Restless legs syndrome  Low vitamin D level  Erectile disorder, acquired, generalized, moderate  Other orders -     methylphenidate  (RITALIN ) 10 MG tablet; Take 3 tablets (30 mg total) by mouth daily as needed.      Mr. More has a long history of depression and OCD.  Major depression with fatigue, cognitive and physical slowness, anhedonia is much worse.  Started Spravato  and improving.  Was better energy with duloxetine  90 vs Lexapro  so increased to 120 mg daily DT worsening depression.    But it was not better. So reduced it back to 90 mg daily. Dep has been much worse, in fact, his worst depression ever in 2025. Spravato  He has some compulsive checking and obsessions around the house maintenance.  When travels then tends to have less OCD bc triggered less.    Reduced Spravato to 56 mg bc seemed quite sedated recently with 84 mg .  Not as much at end of session today. The patient experienced the typical dissociation which gradually resolved over the 2-hour period of observation.  There were no complications.  Specifically the patient did not have nausea or vomiting or headache.   Blood pressures remained within normal ranges at the 40-minute and 2-hour follow-up intervals.  By the time the 2-hour observation period was met the patient was alert and oriented and able to exit without assistance.  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  See nursing note for further details. Emphasized need to relax with Spravato and not return calls, read, return chats, emails etc. Started Spravato 06/25/23.  Will try dropping back to weekly but increase back to 84 mg with next dose.  His OCD is persistent with checking lites, stove, etc. .  It is better at the moment DT depression being worse.  Disc CBT techniques and potential for more therapy to address. Option Dr. Marijean.  Continue  retrial Concerta  36 mg AM He wants to use it for depression and cognitive concerns.  Discussed potential benefits, risks, and side effects of stimulants with patient to include increased heart rate, palpitations, insomnia, increased anxiety, increased irritability, or decreased appetite.  Instructed patient to contact office if experiencing any significant tolerability issues.  Extensive discussion of sleep study and he has a copy.  Why is there virtually no N3 & REM sleep?  Is that affecting daytime alertness and fatigue?  Vs how much is related to mild OSA and PLMS?  Disc this in detail.  Wrote correspondence with sleep doc about it.  Options for sleep & fatigue:  Difficult to assess  bc has been [place] of [place] and then sick for 3 weeks.   Focus on reduction in OSA Focus on improving deep stage sleep.  ? Rx low dose mirtazapine or alternatives Focus on leg movements (hx RLS/PLMS) continue Trial as had been suggested Lyrica  for FM type  sx and may help RLS/PLMS.  Better dreaming on 100 mg HS and too sleepy on 150.  Decreased  to 100 mg HS for sleep and back pain and RLS No ropinirole  needed.   Disc SE.  Not having  any.   Use LED Xanax  and try to avoid BZ daytime bc fatigue.  He doesn't use it much daythime.  Using 0..25-0.5 mg at night.  May need less with more Lyrica  at HS and try to minimize.  No hangover. We discussed the short-term risks associated with benzodiazepines including sedation and increased fall risk among others.  Discussed long-term side effect risk including dependence, potential withdrawal symptoms, and the potential eventual dose-related risk of dementia.  But recent studies from 2020 dispute this association between benzodiazepines and dementia risk. Newer studies in 2020 do not support an association with dementia.  Stimulant partially successfully used off label to augment antidepressants for depression and have resulted in improved productivity and attention.    Previous screening of memory was not suggestive of any neuro degenerative process: Mini-Mental status exam 28/30 on 10/27/20.   Recent cognition worse as dep worsened.  Also gait is short and slow.   neuro appt.  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  Plan:  no med changes.   continue duloxetine  to 120 mg daily.  Off Lexapro .  Lyrica  100 mg HS.  resumed MPH ER 36 mg AM   Spravato disc in detail.  He wants to continue.  He and wife notice he's better.  Administer now drop back to weekly  He's still improving.  Will  increase back to 84 mg .  He is tolerating it better  Follow-up   Lorene Macintosh MD, DFAPA.  Please see After Visit Summary for patient specific instructions.  Future Appointments  Date Time Provider Department Center  08/28/2023 12:30 PM Plaster, Marlon BRAVO, PT [facility]  08/30/2023 12:30 PM Plaster, Marlon BRAVO, PT [facility]  09/04/2023 12:30 PM Annett Sheffield SAILOR, PT [facility]  09/06/2023 12:30 PM Annett Sheffield SAILOR, PT [facility]  09/09/2023  9:30 AM Annett Sheffield SAILOR, PT [facility]  09/17/2023  1:00 PM Cottle, Lorene KANDICE Raddle., MD [facility] None  10/15/2023  1:30 PM Cottle, Lorene KANDICE Raddle., MD [facility]  None  01/30/2024  3:00 PM Tat, Asberry RAMAN, DO [facility] None     No orders of the defined types were placed in this encounter.      -------------------------------

## 2023-08-27 NOTE — Progress Notes (Signed)
 NURSES NOTE:         Pt arrived for his 17 th Spravato Treatment for treatment resistant depression, the starting dose was 56 mg (2 of the 28 mg nasal sprays). Pt is getting 56 mg again today but if tolerating ok he can retry 84 mg as long as not too sedating.  Eric [name] is a patient of Dr. Calhoun so he will follow his care throughout treatments and follow ups. Pt's Spravato is a medical authorization through buy and bill.  Spravato medication is stored at treatment center per REMS/FDA guidelines. The medication is required to be locked behind two doors per REMS/FDA protocol. Medication is also disposed of properly after each use per regulations. All documentation for REMS is completed and submitted per FDA/REMS requirements.          Began taking patient's vital signs at 9:10 AM 148/89, pulse 65, SpO2 95%. Instructed patient to blow his nose if needed then recline back to a 45 degree angle. Pt is aware he is only getting 56 mg which is 2 devices instead of 3. He agreed. Gave patient first dose 28 mg nasal spray, administered in each nostril as directed and observed by nurse, waited 5 more minutes for the second dose.  After both doses given pt did not complain of any nausea/vomiting. Pt was given water, snacks, and candy.  Assessed his 40 minute vitals, 9:50 AM, 140/74, pulse 60, SpO2 96%. Explained he would be monitored for a total time of 120 minutes. Discharge vitals were taken at 11:13 AM 136/70, P 64, SpO2 98%. Dr. Geoffry came to visit with patient once his thoughts were clearer to discuss how treatment went. Pt will go back up to 84 mg at his next treatment and we will see if he's able to tolerate better. Recommend he go home and sleep or just relax on the couch. No driving, no intense activities. Verbalized understanding. Nurse was with pt a total of 60 minutes for clinical assessment. Pt was able to use his rolling walker on the way down to the vehicle, his gait is improving since starting therapy  twice a week. Instructed to call with any issues. Pt will return weekly on Tuesdays.    LOT [id number] EXP JAN 2028

## 2023-08-28 ENCOUNTER — Ambulatory Visit: Attending: Neurology | Admitting: Physical Therapy

## 2023-08-28 VITALS — BP 114/58 | HR 78

## 2023-08-28 DIAGNOSIS — R2689 Other abnormalities of gait and mobility: Secondary | ICD-10-CM | POA: Insufficient documentation

## 2023-08-28 DIAGNOSIS — R2681 Unsteadiness on feet: Secondary | ICD-10-CM | POA: Insufficient documentation

## 2023-08-28 DIAGNOSIS — M6281 Muscle weakness (generalized): Secondary | ICD-10-CM | POA: Insufficient documentation

## 2023-08-28 DIAGNOSIS — R293 Abnormal posture: Secondary | ICD-10-CM | POA: Diagnosis not present

## 2023-08-28 NOTE — Therapy (Signed)
 OUTPATIENT PHYSICAL THERAPY NEURO TREATMENT   Patient Name: Eric [name] MRN: [id number] DOB:1950-09-01, 73 y.o., male Today's Date: 08/28/2023   PCP: Regino Slater, MD   REFERRING PROVIDER: Evonnie Asberry RAMAN, DO   END OF SESSION:  PT End of Session - 08/28/23 1239     Visit Number 10    Number of Visits 13    Date for PT Re-Evaluation 09/21/23    Authorization Type [employer]    Progress Note Due on Visit 9   performed on visit 9   PT Start Time 1237   Pt arrived late   PT Stop Time 1315    PT Time Calculation (min) 38 min    Equipment Utilized During Treatment Gait belt    Activity Tolerance Patient tolerated treatment well    Behavior During Therapy WFL for tasks assessed/performed;Flat affect           Past Medical History:  Diagnosis Date   Allergy    Anemia    iron- pt denies    Anxiety    Aortic cusp regurgitation    Carotid artery occlusion    Constipation    Coronary artery stenosis    Hyperlipidemia    Lactose intolerance    Major depression, recurrent, chronic (HCC)    Obesity    OCD (obsessive compulsive disorder)    OSA (obstructive sleep apnea)    Other chronic pain    Periodic limb movement disorder    Periodic limb movements of sleep    Prediabetes    Pure hypercholesterolemia    Restless legs    Sleep apnea    wears cpap    Vitamin D deficiency    Past Surgical History:  Procedure Laterality Date   COLONOSCOPY     DG BARIUM ENEMA ([facility] HX)     10-19-2010- turtous, elongated colon    HEMORRHOID SURGERY     TONSILLECTOMY  1960   Patient Active Problem List   Diagnosis Date Noted   Vitamin D deficiency 03/30/2020   Pure hypercholesterolemia 03/30/2020   Prediabetes 03/30/2020   Occlusion and stenosis of bilateral carotid arteries 03/30/2020   Obstructive sleep apnea syndrome 03/30/2020   Aortic cusp regurgitation 03/30/2020   Restless legs syndrome 03/30/2020   Moderate recurrent major depression  (HCC) 03/30/2020   Chronic pain 03/30/2020   OCD (obsessive compulsive disorder) 10/15/2017   Family history of ovarian cancer 04/19/2011   Family history of breast cancer 04/19/2011    ONSET DATE: 07/12/2023   REFERRING DIAG: G20.C (ICD-10-CM) - Primary parkinsonism (HCC)   THERAPY DIAG:  Unsteadiness on feet  Other abnormalities of gait and mobility  Abnormal posture  Muscle weakness (generalized)  Rationale for Evaluation and Treatment: Rehabilitation  SUBJECTIVE:  SUBJECTIVE STATEMENT: Pt presents with rollator. Reports he has not been using it much at home. Has not been doing HEP, prefers to work with his trainer. Worked with his trainer before session today, walked up several flights of stairs so legs are tired. Denies pain or falls, reports he stumbles but he catches himself.   Pt accompanied by: Self   PERTINENT HISTORY: PMH: Parkinsonism (Possibly due to exposure to antipsychotic medication), tardive dyskinesias, restless leg syndrome, CAD, pre-diabetes, obesity, OCD, major depression,HLD  He was not exposed to anything for very long, but wife reports that symptoms have really been developing over the last 5 to 6 weeks. He was on Abilify  for a very short time in February and March and then was on Vraylar  from March until June. The symptoms developed around that time. He is off of the Vraylar  now (just went off of it)   Per Dr. Evonnie regarding abnormal brain scan:  It does appear that there is heavy basal ganglia calcification.  We can certainly confirm this with CT.  While Fahrs disease is on the differential, the timeline for his symptoms really correlates more with the timeline for when he was placed on antipsychotic medication.   PAIN:  Are you having pain? No  PRECAUTIONS:  Fall  FALLS: Has patient fallen in last 6 months? No and almost falls grabbing the wall   LIVING ENVIRONMENT: Lives with: lives with their spouse Lives in: House/apartment Stairs: Yes: Internal: 12 steps; on left going up and External: 4 and 5 steps; none Has following equipment at home: Single point cane and Grab bars  PLOF: Independent and Leisure: doesn't have any hobbies except cleaning up the house  PATIENT GOALS: Wants to get back to his regular gait and his regular walk so he can go on trips   OBJECTIVE:  Note: Objective measures were completed at Evaluation unless otherwise noted.  DIAGNOSTIC FINDINGS: MRI brain: IMPRESSION: 1. No acute intracranial abnormality. 2. 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 Head CT may be helpful for further assessment of the degree of mineralization. 3. Findings of chronic small vessel ischemia.  COGNITION: Overall cognitive status: Within functional limits for tasks assessed   SENSATION: WFL  COORDINATION: Heel to shin: some bradykinesia RLE RAM: dysmetric BUE     POSTURE: rounded shoulders, forward head, and posterior pelvic tilt   LOWER EXTREMITY MMT:    MMT Right Eval Left Eval  Hip flexion 3- 4  Hip extension    Hip abduction    Hip adduction    Hip internal rotation    Hip external rotation    Knee flexion 4 5  Knee extension 4 4  Ankle dorsiflexion 5 5  Ankle plantarflexion    Ankle inversion    Ankle eversion    (Blank rows = not tested)  BED MOBILITY:   Pt reports some difficulties getting under the sheets    TRANSFERS: Sit to stand: Modified independence  Assistive device utilized: None     Stand to sit: Modified independence  Assistive device utilized: None     Pt reports difficulties getting up from lower surfaces or in and out of the car.   GAIT: Findings: Gait Characteristics:  decreased arm swing- Right, decreased arm swing- Left, decreased step length- Right, decreased step length- Left, decreased stride length, decreased hip/knee flexion- Right, decreased hip/knee flexion- Left, decreased ankle dorsiflexion- Right, decreased ankle dorsiflexion- Left, Right foot flat,  Left foot flat, shuffling, decreased trunk rotation, trunk flexed, and narrow BOS, Distance walked: Clinic distances, Assistive device utilized:Single point cane and None, Level of assistance: CGA, and Comments: Pt with very shuffled steps. When pt gets single HHA from therapist for a couple steps, pt able to demo improved step length   FUNCTIONAL TESTS:  5 times sit to stand: 15.6 seconds with no UE support, incr forward flexed posture in standing  Timed up and go (TUG): 27.4 seconds with no AD, CGA with en bloc turning 10 meter walk test: 62 seconds with no AD and CGA = .52 ft/sec   VITALS  Vitals:   08/28/23 1244  BP: (!) 114/58  Pulse: 78                                                                                                                                TREATMENT DATE:  Self-care/home management  Assessed vitals (see above) and WNL. Pt reports this is a typical BP for him.  Reiterated importance of using rollator at all times at home (when downstairs) due to his fall risk and frequent stumbles. Pt reports some places in his home are too narrow for rollator and he can use his furniture but understands therapist's perspective.   Ther Act  SciFit multi-peaks level 8.0 for 8 minutes using BUE/BLEs for neural priming for reciprocal movement, dynamic cardiovascular warmup and increased amplitude of stepping. Cues for increased knee extension throughout. Well I did 9 flights of stairs today so my legs are pretty worn out. RPE of 4-5/10 following activity   NMR For improved lateral weight shifting, step clearance and reactive balance:  In // bars on rockerboard in A/P position:  Standing  w/EO and no UE support, x90s. Noted posterior lean preference, but pt did not correct even w/therapist's cues  Alt retro step off board w/BUE support and progressing to no UE support, x15-20 reps per side w/CGA-min A. Mod verbal cues to maintain proper foot position on board and to maintain wider BOS, as pt tends to bring LLE across midline. Decreased step length/clearance noted w/RLE. Alt fwd step off board w/BUE support and progressing to no UE support. Increased difficulty performing this direction and noted continued narrow placement of LLE on board despite cues.  At mat table, set up 4 beam w/4 colored dots on floor placed above beam from 9-3 o'clock and had pt take large step over beam to color that was called out by therapist and perform 6# ball slam prior to retro stepping back to starting position, x20 reps. Provided CGA-min A throughout and pt w/significant difficulty stepping w/RLE. Cued pt to focus on lateral weight shift to assist in step clearance and pt did demonstrate improved step length and clearance, but continued to hit beam 80% of time.      PATIENT EDUCATION: Education details: Continue HEP, importance of using rollator  Person educated: Patient Education method: Psychiatrist  comprehension: verbalized understanding, returned demonstration, verbal cues required, and needs further education  HOME EXERCISE PROGRAM: Standing PWR Moves   Access Code: [email or web address] URL: [email or web address] Date: 08/19/2023 Prepared by: Sheffield Senate  Exercises - Standing Marching  - 1-2 x daily - 5 x weekly - 4 sets  GOALS: Goals reviewed with patient? Yes  SHORT TERM GOALS: Target date: 08/13/2023   1. Pt will be independent with initial HEP with gait, balance, strength in order to build upon functional gains made in therapy. Baseline: pt reports doing standing PWR moves every other day Goal status: PARTIALLY MET  2.  Pt will improve TUG time  to 22 seconds or less with no AD vs. LRAD in order to demo decrease fall risk. Baseline: 27.4 seconds with no AD, CGA  16.6 seconds with no AD SBA/CGA (7/23) Goal status: MET  3.  Pt will improve gait speed with LRAD to at least 1.3 ft/sec in order to demo decr fall risk/improved household mobility.  Baseline: 62 seconds with no AD = .52 ft/sec and CGA   21.7 seconds with rollator = 1.51 ft/sec Goal status: MET  4.  Pt will ambulate at least 230' with supervision with LRAD over level indoor surfaces in order to demo improved household mobility.  Baseline: pt ambulates with no AD with CGA   Ambulates 230' with supervision and rollator  Goal status: MET   LONG TERM GOALS: Target date: 09/03/2023   Pt will be independent with final HEP with gait, balance, strength in order to build upon functional gains made in therapy. Baseline:  Goal status: INITIAL  2.  Pt will improve TUG time to 13.5 seconds or less with no AD vs. LRAD in order to demo decrease fall risk. Baseline: 27.4 seconds with no AD, CGA  16.6 seconds with no AD SBA/CGA (7/23) Goal status: REVISED  3.  Pt will improve 5x sit<>stand to less than or equal to 13 sec to demonstrate improved functional strength and transfer efficiency.  Baseline: 15.6 seconds with no UE support, incr forward flexed posture in standing  11.3 seconds with no UE support (7/30) Goal status: MET  4.  Pt will improve gait speed with LRAD to at least 1.8 ft/sec in order to demo decr fall risk  Baseline: 62 seconds with no AD = .52 ft/sec and CGA  Gait speed with rollator: 34.4 seconds = .95 ft/sec (7/30) Goal status: IN PROGRESS  5.  Pt will ambulate at least 500' with supervision with LRAD over outdoor unlevel surfaces in order to demo improved community mobility.  Baseline:  Goal status: INITIAL    ASSESSMENT:  CLINICAL IMPRESSION: Emphasis of skilled PT session on lateral weight shifting, step clearance and reactive balance. Pt  continues to rely on furniture walking at home rather than use rollator but did demonstrate improved brake management of rollator this date. Pt states he does not do his HEP as he prefers working with his trainer. Pt demonstrates decreased step clearance w/RLE due to decreased weight shift to L but did improve w/verbal cues to shift weight via hips.  Continue POC.  OBJECTIVE IMPAIRMENTS: Abnormal gait, decreased activity tolerance, decreased balance, decreased coordination, decreased knowledge of condition, decreased knowledge of use of DME, decreased mobility, difficulty walking, decreased strength, decreased safety awareness, impaired flexibility, and postural dysfunction.   ACTIVITY LIMITATIONS: transfers, bed mobility, and locomotion level  PARTICIPATION LIMITATIONS: shopping, community activity, and yard work  PERSONAL FACTORS: Age, Behavior pattern, Past/current experiences, Time since onset of injury/illness/exacerbation, and  3+ comorbidities: arkinsonism (Possibly due to exposure to antipsychotic medication), tardive dyskinesias, restless leg syndrome, CAD, pre-diabetes, obesity, OCD, major depression,HLD are also affecting patient's functional outcome.   REHAB POTENTIAL: Good  CLINICAL DECISION MAKING: Evolving/moderate complexity  EVALUATION COMPLEXITY: Moderate  PLAN:  PT FREQUENCY: 2x/week  PT DURATION: 8 weeks  PLANNED INTERVENTIONS: 97164- PT Re-evaluation, 97110-Therapeutic exercises, 97530- Therapeutic activity, 97112- Neuromuscular re-education, 97535- Self Care, [id number]- Manual therapy, [phone]- Gait training, Patient/Family education, Balance training, Stair training, and DME instructions  PLAN FOR NEXT SESSION:   warm up on SciFit, work on incr stride length, foot clearance tasks; balance tasks working on larger amplitude movements posture and trunk rotation, speed of movement, cog dual tasking   *Pt asking to add more visits on 8/6 - told him to ask primary PT     Tessy Pawelski E Alexia Dinger, PT,DPT 08/28/2023, 3:42 PM

## 2023-08-30 ENCOUNTER — Ambulatory Visit: Admitting: Physical Therapy

## 2023-08-30 DIAGNOSIS — R293 Abnormal posture: Secondary | ICD-10-CM | POA: Diagnosis not present

## 2023-08-30 DIAGNOSIS — R2681 Unsteadiness on feet: Secondary | ICD-10-CM | POA: Diagnosis not present

## 2023-08-30 DIAGNOSIS — R2689 Other abnormalities of gait and mobility: Secondary | ICD-10-CM | POA: Diagnosis not present

## 2023-08-30 DIAGNOSIS — M6281 Muscle weakness (generalized): Secondary | ICD-10-CM | POA: Diagnosis not present

## 2023-08-30 NOTE — Therapy (Addendum)
 OUTPATIENT PHYSICAL THERAPY NEURO TREATMENT   Patient Name: Eric [name] MRN: [id number] DOB:Mar 10, 1950, 73 y.o., male Today's Date: 08/30/2023   PCP: Regino Slater, MD   REFERRING PROVIDER: Evonnie Asberry RAMAN, DO   END OF SESSION:  PT End of Session - 08/30/23 1234     Visit Number 11    Number of Visits 13    Date for PT Re-Evaluation 09/21/23    Authorization Type [employer]    Progress Note Due on Visit 9   performed on visit 9   PT Start Time 1232    PT Stop Time 1321    PT Time Calculation (min) 49 min    Equipment Utilized During Treatment --    Activity Tolerance Patient tolerated treatment well    Behavior During Therapy WFL for tasks assessed/performed;Flat affect            Past Medical History:  Diagnosis Date   Allergy    Anemia    iron- pt denies    Anxiety    Aortic cusp regurgitation    Carotid artery occlusion    Constipation    Coronary artery stenosis    Hyperlipidemia    Lactose intolerance    Major depression, recurrent, chronic (HCC)    Obesity    OCD (obsessive compulsive disorder)    OSA (obstructive sleep apnea)    Other chronic pain    Periodic limb movement disorder    Periodic limb movements of sleep    Prediabetes    Pure hypercholesterolemia    Restless legs    Sleep apnea    wears cpap    Vitamin D deficiency    Past Surgical History:  Procedure Laterality Date   COLONOSCOPY     DG BARIUM ENEMA ([facility] HX)     10-19-2010- turtous, elongated colon    HEMORRHOID SURGERY     TONSILLECTOMY  1960   Patient Active Problem List   Diagnosis Date Noted   Vitamin D deficiency 03/30/2020   Pure hypercholesterolemia 03/30/2020   Prediabetes 03/30/2020   Occlusion and stenosis of bilateral carotid arteries 03/30/2020   Obstructive sleep apnea syndrome 03/30/2020   Aortic cusp regurgitation 03/30/2020   Restless legs syndrome 03/30/2020   Moderate recurrent major depression (HCC) 03/30/2020    Chronic pain 03/30/2020   OCD (obsessive compulsive disorder) 10/15/2017   Family history of ovarian cancer 04/19/2011   Family history of breast cancer 04/19/2011    ONSET DATE: 07/12/2023   REFERRING DIAG: G20.C (ICD-10-CM) - Primary parkinsonism (HCC)   THERAPY DIAG:  Unsteadiness on feet  Other abnormalities of gait and mobility  Abnormal posture  Rationale for Evaluation and Treatment: Rehabilitation  SUBJECTIVE:  SUBJECTIVE STATEMENT: Pt presents with rollator. Reports he walked 1/3 of a mile yesterday w/rollator. Has been using rollator more in the house, but does not have much space to use it. No falls or pain today.   Pt accompanied by: Self   PERTINENT HISTORY: PMH: Parkinsonism (Possibly due to exposure to antipsychotic medication), tardive dyskinesias, restless leg syndrome, CAD, pre-diabetes, obesity, OCD, major depression,HLD  He was not exposed to anything for very long, but wife reports that symptoms have really been developing over the last 5 to 6 weeks. He was on Abilify  for a very short time in February and March and then was on Vraylar  from March until June. The symptoms developed around that time. He is off of the Vraylar  now (just went off of it)   Per Dr. Evonnie regarding abnormal brain scan:  It does appear that there is heavy basal ganglia calcification.  We can certainly confirm this with CT.  While Fahrs disease is on the differential, the timeline for his symptoms really correlates more with the timeline for when he was placed on antipsychotic medication.   PAIN:  Are you having pain? No  PRECAUTIONS: Fall  FALLS: Has patient fallen in last 6 months? No and almost falls grabbing the wall   LIVING ENVIRONMENT: Lives with: lives with their spouse Lives in:  House/apartment Stairs: Yes: Internal: 12 steps; on left going up and External: 4 and 5 steps; none Has following equipment at home: Single point cane and Grab bars  PLOF: Independent and Leisure: doesn't have any hobbies except cleaning up the house  PATIENT GOALS: Wants to get back to his regular gait and his regular walk so he can go on trips   OBJECTIVE:  Note: Objective measures were completed at Evaluation unless otherwise noted.  DIAGNOSTIC FINDINGS: MRI brain: IMPRESSION: 1. No acute intracranial abnormality. 2. 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 Head CT may be helpful for further assessment of the degree of mineralization. 3. Findings of chronic small vessel ischemia.  COGNITION: Overall cognitive status: Within functional limits for tasks assessed   SENSATION: WFL  COORDINATION: Heel to shin: some bradykinesia RLE RAM: dysmetric BUE     POSTURE: rounded shoulders, forward head, and posterior pelvic tilt   LOWER EXTREMITY MMT:    MMT Right Eval Left Eval  Hip flexion 3- 4  Hip extension    Hip abduction    Hip adduction    Hip internal rotation    Hip external rotation    Knee flexion 4 5  Knee extension 4 4  Ankle dorsiflexion 5 5  Ankle plantarflexion    Ankle inversion    Ankle eversion    (Blank rows = not tested)  BED MOBILITY:   Pt reports some difficulties getting under the sheets    TRANSFERS: Sit to stand: Modified independence  Assistive device utilized: None     Stand to sit: Modified independence  Assistive device utilized: None     Pt reports difficulties getting up from lower surfaces or in and out of the car.   GAIT: Findings: Gait Characteristics: decreased arm swing- Right, decreased arm swing- Left, decreased step length- Right, decreased step length- Left, decreased stride length, decreased hip/knee  flexion- Right, decreased hip/knee flexion- Left, decreased ankle dorsiflexion- Right, decreased ankle dorsiflexion- Left, Right foot flat, Left foot flat, shuffling, decreased trunk rotation, trunk flexed, and narrow BOS, Distance walked: Clinic distances, Assistive device  utilized:Single point cane and None, Level of assistance: CGA, and Comments: Pt with very shuffled steps. When pt gets single HHA from therapist for a couple steps, pt able to demo improved step length   FUNCTIONAL TESTS:  5 times sit to stand: 15.6 seconds with no UE support, incr forward flexed posture in standing  Timed up and go (TUG): 27.4 seconds with no AD, CGA with en bloc turning 10 meter walk test: 62 seconds with no AD and CGA = .52 ft/sec   VITALS  There were no vitals filed for this visit.                                                                                                                               TREATMENT DATE:  Self-care/home management  Discussed importance of using rollator at all times outside due to pt's shuffled gait and falls history. Pt in agreement to use when outside but reports it is difficult to use in the house due to tight spaces. Pt states he is not furniture walking as much now, so advised to use the rollator as able when he feels unstable. Pt verbalized understanding.  Pt inquiring if he is going to return to baseline function. Educated pt on drug-induced PD and being unsure what he will and won't get back. Addressed all pt's questions regarding dopamine, basal ganglia and caudate nucleus within scope of PT.  Pt requesting to review his brain MRI and CT as he does not know what calcification looks like, so reviewed images with pt and provided translation of medical impression of images. Pt verbalized understanding.  Per pt request, reviewed pt's recent parathyroid blood test and educated pt on role of parathyroid, effects on calcium and calcification in brain.  Provided pt w/handout  on PD/Movement Disorder Symposium and PD community resources and assisted pt in signing up for symposium and highlighted email addresses for Lauraine Bend and Greig Anon to obtain information on PWR moves class, as pt interested in this.    PATIENT EDUCATION: Education details: Continue HEP, See above Person educated: Patient Education method: Medical illustrator Education comprehension: verbalized understanding, returned demonstration, verbal cues required, and needs further education  HOME EXERCISE PROGRAM: Standing PWR Moves   Access Code: [email or web address] URL: [email or web address] Date: 08/19/2023 Prepared by: Sheffield Senate  Exercises - Standing Marching  - 1-2 x daily - 5 x weekly - 4 sets  GOALS: Goals reviewed with patient? Yes  SHORT TERM GOALS: Target date: 08/13/2023   1. Pt will be independent with initial HEP with gait, balance, strength in order to build upon functional gains made in therapy. Baseline: pt reports doing standing PWR moves every other day Goal status: PARTIALLY MET  2.  Pt will improve TUG time to 22 seconds or less with no AD vs. LRAD in order to demo decrease fall risk. Baseline: 27.4 seconds with no AD, CGA  16.6 seconds with no AD SBA/CGA (7/23) Goal status:  MET  3.  Pt will improve gait speed with LRAD to at least 1.3 ft/sec in order to demo decr fall risk/improved household mobility.  Baseline: 62 seconds with no AD = .52 ft/sec and CGA   21.7 seconds with rollator = 1.51 ft/sec Goal status: MET  4.  Pt will ambulate at least 230' with supervision with LRAD over level indoor surfaces in order to demo improved household mobility.  Baseline: pt ambulates with no AD with CGA   Ambulates 230' with supervision and rollator  Goal status: MET   LONG TERM GOALS: Target date: 09/03/2023   Pt will be independent with final HEP with gait, balance, strength in order to build upon functional gains made in therapy. Baseline:   Goal status: INITIAL  2.  Pt will improve TUG time to 13.5 seconds or less with no AD vs. LRAD in order to demo decrease fall risk. Baseline: 27.4 seconds with no AD, CGA  16.6 seconds with no AD SBA/CGA (7/23) Goal status: REVISED  3.  Pt will improve 5x sit<>stand to less than or equal to 13 sec to demonstrate improved functional strength and transfer efficiency.  Baseline: 15.6 seconds with no UE support, incr forward flexed posture in standing  11.3 seconds with no UE support (7/30) Goal status: MET  4.  Pt will improve gait speed with LRAD to at least 1.8 ft/sec in order to demo decr fall risk  Baseline: 62 seconds with no AD = .52 ft/sec and CGA  Gait speed with rollator: 34.4 seconds = .95 ft/sec (7/30) Goal status: IN PROGRESS  5.  Pt will ambulate at least 500' with supervision with LRAD over outdoor unlevel surfaces in order to demo improved community mobility.  Baseline:  Goal status: INITIAL    ASSESSMENT:  CLINICAL IMPRESSION: Entirety of PT session spent providing pt education on Parkinsonism, PD community resources and addressing pt's questions regarding fall risk and need to use rollator. Pt reports he does use rollator outside but has hard time using inside due to narrow space, but is not furniture walking as much now. Addressed pt's questions regarding dopamine, drug-induced parkinsonism and Fahr's Disease within scope of PT practice and encouraged pt to focus on his exercises. Pt verbalized interest in PWR moves class, so provided pt w/PD community resources and assisted pt in signing up for PD/Movement Disorders symposium. Continue POC.   OBJECTIVE IMPAIRMENTS: Abnormal gait, decreased activity tolerance, decreased balance, decreased coordination, decreased knowledge of condition, decreased knowledge of use of DME, decreased mobility, difficulty walking, decreased strength, decreased safety awareness, impaired flexibility, and postural dysfunction.   ACTIVITY  LIMITATIONS: transfers, bed mobility, and locomotion level  PARTICIPATION LIMITATIONS: shopping, community activity, and yard work  PERSONAL FACTORS: Age, Behavior pattern, Past/current experiences, Time since onset of injury/illness/exacerbation, and 3+ comorbidities: arkinsonism (Possibly due to exposure to antipsychotic medication), tardive dyskinesias, restless leg syndrome, CAD, pre-diabetes, obesity, OCD, major depression,HLD are also affecting patient's functional outcome.   REHAB POTENTIAL: Good  CLINICAL DECISION MAKING: Evolving/moderate complexity  EVALUATION COMPLEXITY: Moderate  PLAN:  PT FREQUENCY: 2x/week  PT DURATION: 8 weeks  PLANNED INTERVENTIONS: 97164- PT Re-evaluation, 97110-Therapeutic exercises, 97530- Therapeutic activity, 97112- Neuromuscular re-education, 97535- Self Care, [id number]- Manual therapy, [phone]- Gait training, Patient/Family education, Balance training, Stair training, and DME instructions  PLAN FOR NEXT SESSION:   Provide more education on PWR moves classes. Did he email Sarah and Amy?   warm up on [employer], work on incr stride length, foot clearance tasks; balance tasks working  on larger amplitude movements posture and trunk rotation, speed of movement, cog dual tasking    Amiree No E Aletheia Tangredi, PT, DPT 08/30/2023, 1:32 PM

## 2023-09-02 DIAGNOSIS — G4733 Obstructive sleep apnea (adult) (pediatric): Secondary | ICD-10-CM | POA: Diagnosis not present

## 2023-09-03 ENCOUNTER — Ambulatory Visit: Admitting: Psychiatry

## 2023-09-03 ENCOUNTER — Encounter: Payer: Self-pay | Admitting: Psychiatry

## 2023-09-03 ENCOUNTER — Ambulatory Visit

## 2023-09-03 VITALS — BP 132/73 | HR 70

## 2023-09-03 DIAGNOSIS — F339 Major depressive disorder, recurrent, unspecified: Secondary | ICD-10-CM

## 2023-09-03 DIAGNOSIS — G3184 Mild cognitive impairment, so stated: Secondary | ICD-10-CM

## 2023-09-03 DIAGNOSIS — G4733 Obstructive sleep apnea (adult) (pediatric): Secondary | ICD-10-CM

## 2023-09-03 DIAGNOSIS — F331 Major depressive disorder, recurrent, moderate: Secondary | ICD-10-CM

## 2023-09-03 DIAGNOSIS — F900 Attention-deficit hyperactivity disorder, predominantly inattentive type: Secondary | ICD-10-CM

## 2023-09-03 DIAGNOSIS — F422 Mixed obsessional thoughts and acts: Secondary | ICD-10-CM

## 2023-09-03 DIAGNOSIS — R7989 Other specified abnormal findings of blood chemistry: Secondary | ICD-10-CM

## 2023-09-03 DIAGNOSIS — G8929 Other chronic pain: Secondary | ICD-10-CM

## 2023-09-03 DIAGNOSIS — G2581 Restless legs syndrome: Secondary | ICD-10-CM

## 2023-09-03 DIAGNOSIS — G4730 Sleep apnea, unspecified: Secondary | ICD-10-CM

## 2023-09-03 DIAGNOSIS — F5221 Male erectile disorder: Secondary | ICD-10-CM

## 2023-09-03 DIAGNOSIS — G4710 Hypersomnia, unspecified: Secondary | ICD-10-CM

## 2023-09-03 MED ORDER — METHYLPHENIDATE HCL 10 MG PO TABS
30.0000 mg | ORAL_TABLET | Freq: Every day | ORAL | 0 refills | Status: DC | PRN
Start: 2023-09-03 — End: 2023-11-26

## 2023-09-03 MED ORDER — METHYLPHENIDATE HCL ER (OSM) 36 MG PO TBCR: 36.0000 mg | EXTENDED_RELEASE_TABLET | Freq: Every day | ORAL | 0 refills | Status: DC

## 2023-09-03 MED ORDER — DULOXETINE HCL 30 MG PO CPEP: 90.0000 mg | ORAL_CAPSULE | Freq: Every day | ORAL | 0 refills | Status: DC

## 2023-09-03 MED ORDER — PREGABALIN 50 MG PO CAPS: 50.0000 mg | ORAL_CAPSULE | Freq: Every evening | ORAL | 0 refills | Status: DC

## 2023-09-03 NOTE — Progress Notes (Signed)
 NURSES NOTE:         Pt arrived for his #18 Spravato Treatment for treatment resistant depression, the starting dose was 56 mg (2 of the 28 mg nasal sprays). Pt is getting 84 mg again today after receiving 3 of the 56 mg the past 3 weeks due to sedation.  Eric [name] is a patient of Dr. Calhoun so he will follow his care throughout treatments and follow ups. Pt's Spravato is a medical authorization through buy and bill.  Spravato medication is stored at treatment center per REMS/FDA guidelines. The medication is required to be locked behind two doors per REMS/FDA protocol. Medication is also disposed of properly after each use per regulations. All documentation for REMS is completed and submitted per FDA/REMS requirements.          Began taking patient's vital signs at 9:20 AM 121/98, pulse 66, SpO2 95%. Instructed patient to blow his nose if needed then recline back to a 45 degree angle. Pt is aware he is only getting 56 mg which is 2 devices instead of 3. He agreed. Gave patient first dose 28 mg nasal spray, administered in each nostril as directed and observed by nurse, waited 5 more minutes for the second dose.  After both doses given pt did not complain of any nausea/vomiting. Pt was given water, snacks, and candy.  Assessed his 40 minute vitals, 10:00 AM, 138/73, pulse 65, SpO2 95%. Pt was pretty sedate at this time.  Explained he would be monitored for a total time of 120 minutes. Discharge vitals were taken at 11:02 AM 132/73, P 70, SpO2 95%. Dr. Geoffry came to visit with patient once his thoughts were clearer to discuss how treatment went. Recommend he go home and sleep or just relax on the couch. No driving, no intense activities. Verbalized understanding. Nurse was with pt a total of 60 minutes for clinical assessment. Pt was able to use his rolling walker on the way down to the vehicle, his gait today was pretty wobbly due to the higher dose of 84 mg. Instructed to call with any issues. Pt will  return weekly on Tuesdays.    LOT [id number] EXP FEB 2028

## 2023-09-03 NOTE — Progress Notes (Signed)
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 noted: Worrying too much bc OCD.  Trying to decide about how to deal with a trigger lately.  OCD driving me crazy wanting to make things perfect.   Other than the trigger had a nice time since here.  GS here for 5 days at 73 yo.  Enjoyed that.   Taking semaglutide   down to 250# from 283#. Card at [facility] says he does not have Aortic aneurysm.  Measuring is OK.  Will FU with MRI in June.   Some diarrhea lately. Cannot stop worrying.  Triggered by the plumbing issue. Meds as above.  No SE of sig.   Plan:  switched to duloxetine  to 90 mg daily.  Off Lexapro .  Others as noted. Lyrica  100 mg HS. Has held Ritalin  10 TID, can resume as long as SBP below 140 per card.  none needed ropinirole  3 mg pm.  No risperidone .  02/18/23 appt noted:  with [name] Meds: as above. Taking Ritalin  30% of night.  Prn Xanax  rarely if gets off sleep cycle. No SE.   Terribly dep but not as bad as in the past. Taking Ritalin  appears to help significantly and started doing that.  Tend to stay in bed till noon but pattern of up late.   OCD about the same with some avoidance of detail work.   Afraid I'll throw away something I need.   She doesn't know whether Ritalin  helps No sig RLS and wife agrees. Thinks of deceased B at the holidays but worries over son Deward too.   Plan: Increase duloxetine  to 120 mg daily.  Off Lexapro .  Others as noted. Lyrica  100 mg HS.  resumed Ritalin  30 AM with some benefit. no risperidone .  03/19/23 appt noted: Psych med:  duloxetine  120, Ritalin  20 am  missing doses, Lyrica  100 HS, no risperidone , no ropinirole .   No improvement in depression since increase dose duloxetine .  More dep than usual.  Worrying about everything.  Including taxes.  All I can see is a black hole.  Making him feel negative about everything.   Keeping him from doing things.   OCD is not much different from when on Lexapro .  Wife agrees dep and distracted. Plan: resumed Ritalin  30 AM with some benefit. Resume Abilify  2 mg AM for dep.  04/16/23 appt noted:  wife here Psych med:  duloxetine  120, resumed Concerta  36 mg AM, Lyrica  100 HS, Abilify  2, no ropinirole .   Wife noted he seemed manic yesterday.  Invited window estimate against wife's will and without her input.   No mania noted until yesterday. He didn't feel manic yesterday.   He's noticed no change with Abilify  and still feels flat and a little low.  From MPH energy and mood a little better.  Sleeps until 10-11 am about 12 hours. And asleep that whole time. Wife says he doesn't eat much bc he's not awake much. Trouble with hygiene.   Plan: Meds: continue duloxetine  to 120 mg daily.  Off Lexapro .  Lyrica  100 mg HS.  resumed Ritalin  30 AM with some benefit. DC Abiilify Vraylar  1.5 mg every other day Spravato disc in detail.  He wants to pursue if not better with Vraylar .  06/18/23 appt noted: with W Med:  duloxetine  120, resumed Concerta  36 mg AM, Lyrica  100 HS, no ropinirole .   Vraylar  1.5 every other day, no benefit Spravato denied by insurance but is being appealed. More trouble walking and shorter gait.  Neuro  in [place] appt in Sept.   Looking to get in in [place].  Can't be much more dep than I am.   OCD residual when has to make a decision.   ED is more of a px. Reduced voice family. Plan : Spravato  06/25/23 appt noted: with W Med:  duloxetine  120, Concerta  36 mg AM, Lyrica  100 HS, no ropinirole .   Vraylar  1.5 daily No SE Received Spravato 56 mg today.  Experienced very mild dissociation and no sig HA, N.  But some dizziness.  Resolved by end of 2 hours observation.  Able to leave office without assistance.  Ongoing dep without change.  Slow, reduced cognition, anhedonia, forgetful, low motivation. No other concerns with meds.   06/27/23 appt noted:  Med: Med:  duloxetine  120, Concerta  36 mg AM, Lyrica  100 HS, no ropinirole .   Stopped Vraylar  1.5 daily No SE Received Spravato 84 mg todayfor the first time..  Experienced very mild dissociation and no sig HA, N.  But some dizziness.  Resolved by end of 2 hours observation.  Able to leave office without assistance.  Ongoing dep without change.  Slow, reduced cognition, anhedonia, forgetful, low motivation. No other concerns with meds.   07/02/23 appt oted: Med: Med:  duloxetine  90, Concerta  36 mg AM, Lyrica  100 HS, no ropinirole .   Stopped Vraylar  1.5 daily No SE Received Spravato 84 mg today.  Experienced very mild dissociation and no sig HA, N.  But some dizziness.  Resolved by end of 2 hours observation.  Mildly drowsy at end of session. BC of gait px he needed to use WC to leave the office.  Per wife gait gradually worsened over a couple of years but accelerated recently in 3 mos or so.  Short gait.  Not due to pain.  Pending neuro appt.  MRI last week not read yet. No problems with meds. Wife noticed he's shown more initiative at home since [place] ex doing crossword puzzles.  More engaged.  He feels lighter already with it.  07/15/23 appt noted:  Med: Med:  duloxetine  90, Concerta  36 mg AM, Lyrica  100 HS, no ropinirole .  No SE Received Spravato  84 mg today.  Experienced very mild dissociation and no sig HA, N.  But some dizziness.  Resolved by end of 2 hours observation.  Mildly drowsy at end of session. BC of gait px he needed to use WC to leave the office. Seen with W.   W noted clear benefit Spravato with stronger voice, spontaneous chores he wasn't doing.  More engaged in coverstation.  Pt agrees. Disc concerns over gait px and his neuro visit and MRI scan suggesting Fahr's DZ.  He'll discuss further with DR. Tat tomorrow.  07/18/23 appt noted:  Med: Med:  duloxetine  90, Concerta  36 mg AM, Lyrica  100 HS, no ropinirole .  No SE Received Spravato 84 mg today.  Experienced very mild dissociation and no sig HA, N.  But some dizziness.  Resolved by end of 2 hours observation.  Mildly drowsy at end of session. BC of gait px he needed to use WC to leave the office. No med concerns.  Seen with W.    Both agree dep is better .  More affective range.  More socially engaged.  Quicker thought.  More active .  Less down and less negative.  07/23/23 appt noted: Med: Med:  duloxetine  90, Concerta  36 mg AM, Lyrica  100 HS, no ropinirole .  No SE Received Spravato 84 mg today.  Experienced very mild dissociation and no sig HA, N.  But some dizziness.  Resolved by end of 2 hours observation.  Mildly drowsy at end of session. BC of gait px he needed to use WC to leave the office. No med concerns.  Seen with W.    Dep is still improved.  But is dealing with poor gait, weak and slow.  Will start neurorehab today.   Anxiety re: OCD manageable. Thought and affect quicker and more responsive .  Humor returned.  07/25/23 appt:  Med: Med:  duloxetine  90, Concerta  36 mg AM, Lyrica  100 HS, no ropinirole .  No SE Received Spravato 84 mg today.  Experienced very mild dissociation and no sig HA, N.  But some dizziness.  Resolved by end of 2 hours observation.  Mildly drowsy at end of session. BC of gait px he needed to use WC to leave the office. No med  concerns.  Seen with W.    Overall mood still improving but not 100%.  Starting neurorehab for weakness.  No new med concerns.  Satisfied with meds.  07/30/23 appt noted:  Med: Med:  duloxetine  90, Concerta  36 mg AM, Lyrica  100 HS, no ropinirole .  No SE Received Spravato 84 mg today.  Experienced very mild dissociation and no sig HA, N.  But some dizziness.  Resolved by end of 2 hours observation.  Mildly drowsy at end of session. BC of gait px he needed to use WC to leave the office. No med concerns.  Seen with W.    Both agree mood is improving and activity and interest.  Not 100% but limited by mobility and starting PT.    08/15/23 appt noted:  Med:  duloxetine  90, Concerta  36 mg AM, Lyrica  100  HS, no ropinirole .  No SE Received Spravato 56 mg today.  Experienced very mild dissociation and no sig HA, N.  But some dizziness.  Resolved by end of 2 hours observation.  Mildly drowsy at end of session.  But less than when on 84 mg. BC of gait px he needed to use WC to leave the office. No med concerns.  Seen with W.    Less drowsy at  end of time here than when got 84 mg daily. They both agree good response with dep and anxiety.  More involved and better interaction socially.  More initiative.  08/20/23 appt noted:  Med:  duloxetine  90, Concerta  36 mg AM, Lyrica  100 HS, no ropinirole .  No SE Received Spravato 56 mg today.  Experienced very mild dissociation and no sig HA, N.  But some dizziness.  Resolved by end of 2 hours observation.  Mildly drowsy at end of session.  But less than when on 84 mg. BC of gait px he needed to use WC to leave the office. No med concerns.  Seen with W.   She's still concerned he sleeps too much.  He's not sure why he does so.  Overall mood, initiative, socialization is better. Less drowsy at  end of time here than when got 84 mg daily.  08/27/23 appt noted:  Med:  duloxetine  90, Concerta  36 mg AM, Lyrica  100 HS, no ropinirole .  No SE Received Spravato 84 mg  today.  Experienced very mild dissociation and no sig HA, N.  But some dizziness.  Resolved by end of 2 hours observation.  Mildly drowsy at end of session.  But less than when on 84 mg. Walking is some better with PT.  Wife agrees.  He's more interested, responsive, engaged.  Better cognition. After Spravato 56 he does not have to nap when goes home. After 84 mg needed to nap but he wants to increase back to 84 mg to get max effect. More sedate after 84 mg today but is able to clear by end of session.  But wants to continue it.  Over all doing pretty well  with less dep and more engagement and activity including spontaneously cleaning things.   ECT-MADRS    Flowsheet Row Office Visit from 06/18/2023 in [facility] Office Visit from 04/16/2023 in [facility]  MADRS Total Score 37 36   PHQ2-9    Flowsheet Row Office Visit from 03/15/2020 in [facility] Total Score 3  PHQ-9 Total Score 9     B schizophrenic SUI. After M's death. PCP Cecil Ee at [place]  [facility] at 73 years old.  Prior psychiatric medication trials include  Lexapro  20, citalopram NR, clomipramine weight gain, paroxetine, fluoxetine, Luvox, Trintellix,   Increase Lexapro  back to 20 mg January 2020. & 10/2020 bupropion ,  Auvelity NR  Abilify  10 fatigue,  Cerefolin NAC, and   Naltrexone  sexual SE Lyrica  150 tired  pramipexole,  ropinirole  Adderall XR & IR sexual SE,  Ritalin  30, Concerta  72 mg AM NR modafinil  and Nuvigil,   History [employer] OCD  Review of Systems:  Review of Systems  Constitutional:  Positive for fatigue. Negative for fever.  Cardiovascular:  Negative for chest pain and palpitations.  Genitourinary:        ED  Musculoskeletal:  Positive for arthralgias, gait problem and myalgias.  Neurological:  Positive for weakness. Negative for syncope.  Psychiatric/Behavioral:  Positive for dysphoric mood and sleep disturbance. Negative for agitation, behavioral problems, confusion, decreased concentration, hallucinations, self-injury and suicidal ideas. The patient is nervous/anxious. The patient is not hyperactive.     Medications: I have reviewed the patient's current medications.  Current Outpatient Medications  Medication Sig Dispense Refill   ALPRAZolam  (XANAX ) 0.25 MG tablet TAKE 1 TABLET (0.25 MG TOTAL) BY MOUTH 2 (TWO) TIMES DAILY AS NEEDED FOR ANXIETY OR SLEEP. 30 tablet 1   aspirin 81 MG tablet Take 81 mg by mouth daily.     Cholecalciferol (VITAMIN D-3) 5000 units TABS Take 5,000 Units by mouth daily.      DULoxetine  (CYMBALTA ) 30 MG capsule Take 3 capsules (90 mg total) by mouth daily. 270 capsule 0   methylphenidate  (RITALIN ) 10 MG tablet Take 3 tablets (30 mg total) by mouth daily as needed. 60 tablet 0   methylphenidate  36 MG PO CR tablet Take 1 tablet (36 mg total) by mouth daily. 30 tablet 0   naltrexone  (DEPADE) 50 MG tablet Take 0.5 tablets (25 mg total) by mouth daily. 45 tablet 1   pregabalin  (LYRICA ) 50 MG capsule Take 1 capsule (50 mg total) by mouth at bedtime. 90 capsule 0   simvastatin (ZOCOR) 20 MG tablet Take 20 mg by mouth at bedtime.     ZEPBOUND  10 MG/0.5ML Pen Inject 10 mg into the skin once a week.     No current facility-administered medications for this visit.    Medication Side Effects: None sexual SE are better not  All gone.  Allergies:  Allergies  Allergen Reactions   E.E.S. [Erythromycin] Hives   Macrolides And Ketolides Other (See Comments)    EES    Rosuvastatin     Other reaction(s): cramps    Past Medical History:  Diagnosis Date   Allergy    Anemia    iron- pt denies    Anxiety    Aortic cusp regurgitation    Carotid artery occlusion    Constipation    Coronary artery stenosis    Hyperlipidemia    Lactose intolerance    Major depression, recurrent, chronic (HCC)    Obesity    OCD (obsessive  compulsive disorder)    OSA (obstructive sleep apnea)    Other chronic pain    Periodic limb movement disorder    Periodic limb movements of sleep    Prediabetes    Pure hypercholesterolemia    Restless legs    Sleep apnea    wears cpap    Vitamin D deficiency     Family History  Problem Relation Age of Onset   Cancer Mother        breast and ovarian   Anxiety disorder Mother    Breast cancer Mother    Ovarian cancer Mother    Depression Father        bi-polar   Hyperlipidemia Father    Heart disease Father    Sudden death Father    Bipolar disorder Father    Sleep apnea Father    Obesity Father    Depression Son    Colon cancer Neg Hx    Colon polyps Neg Hx    Esophageal cancer Neg Hx    Rectal cancer Neg Hx    Stomach cancer Neg Hx     Social History   Socioeconomic History   Marital status: Married    Spouse name: Not on file   Number of children: Not on file   Years of education:  Not on file   Highest education level: Not on file  Occupational History   Occupation: retired Pensions consultant  Tobacco Use   Smoking status: Never   Smokeless tobacco: Never  Substance and Sexual Activity   Alcohol use: Yes    Comment: occasionally    Drug use: No   Sexual activity: Not on file  Other Topics Concern   Not on file  Social History Narrative   Not on file   Social Drivers of Health   Financial Resource Strain: Not on file  Food Insecurity: No Food Insecurity (01/25/2022)   Received from [facility] visits prior to 03/24/2022., [facility]   Hunger Vital Sign    Within the past 12 months, you worried that your food would run out before you got the money to buy more.: Never true    Within the past 12 months, the food you bought just didn't last and you didn't have money to get more.: Never true  Transportation Needs: No Transportation Needs (01/25/2022)   Received from [facility], [facility] visits prior to 03/24/2022.    PRAPARE - Administrator, Civil Service (Medical): No    Lack of Transportation (Non-Medical): No  Physical Activity: Not on file  Stress: Not on file  Social Connections: Not on file  Intimate Partner Violence: Not on file    Past Medical History, Surgical history, Social history, and Family history were reviewed and updated as appropriate.   Please see review of systems for further details on the patient's review from today.   Objective:   Physical Exam:  There were no vitals taken for this visit.  Physical Exam Constitutional:      General: He is not in acute distress.    Appearance: He is obese.  Musculoskeletal:        General: No deformity.  Neurological:     Mental Status: He is alert and oriented to person, place, and time.     Cranial Nerves: No dysarthria.     Motor: Weakness present.     Coordination: Coordination abnormal.     Comments: Shortened gait is better.  No sig tremor.  less Slow but still not normal.  Psychiatric:        Attention and Perception: Attention and perception normal. He does not perceive auditory or visual hallucinations.        Mood and Affect: Mood is anxious and depressed. Affect is not labile, blunt, angry, tearful or inappropriate.        Speech: Speech normal.        Behavior: Behavior is not slowed. Behavior is cooperative.        Thought Content: Thought content normal. Thought content is not paranoid or delusional. Thought content does not include homicidal or suicidal ideation. Thought content does not include suicidal plan.        Cognition and Memory: Cognition and memory normal.        Judgment: Judgment normal.     Comments: Insight intact Depression 60% better OCD is about the same and not the main problem More expressive and more normal affect.  Quicker responses.    November 06, 2018: [place] Cog test in office within normal limits MMSE 28/30. Animal fluency 17 . (borderline) Taken as a whole, no indication  to pursue neuropsychological testing.  Mini-Mental status exam 28/30 on 10/27/20.  No evidence of dementia.  Lab Review:   Vitamin D level acceptable at 54.5.  Normal B12 and folate and TSH in last couple of years..  Echocardiogram is stable re: AVR over the last 8 years and not likely the cause of lethargy.   06/28/23 MRI head:  IMPRESSION: 1. No acute intracranial abnormality. 2. 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 Head CT may be helpful for further assessment of the degree of mineralization. 3. Findings of chronic small vessel ischemia.  SABRAres Assessment: Plan:    Rae was seen today for follow-up, depression, anxiety, fatigue and add.  Diagnoses and all orders for this visit:  Recurrent major depression resistant to treatment [facility])  Mixed obsessional thoughts and acts -     DULoxetine  (CYMBALTA ) 30 MG capsule; Take 3 capsules (90 mg total) by mouth daily.  Attention deficit hyperactivity disorder (ADHD), predominantly inattentive type -     methylphenidate  (RITALIN ) 10 MG tablet; Take 3 tablets (30 mg total) by mouth daily as needed. -     methylphenidate  36 MG PO CR tablet; Take 1 tablet (36 mg total) by mouth daily.  Hypersomnia with sleep apnea -     pregabalin  (LYRICA ) 50 MG capsule; Take 1 capsule (50 mg total) by mouth at bedtime.  Mild cognitive impairment  Restless legs syndrome -     pregabalin  (LYRICA ) 50 MG capsule; Take 1 capsule (50 mg total) by mouth at bedtime.  Low vitamin D level  Erectile disorder, acquired, generalized, moderate  Obstructive sleep apnea  Major depressive disorder, recurrent episode, moderate (HCC) -     methylphenidate  36 MG PO CR tablet; Take 1 tablet (36 mg total) by mouth daily. -     DULoxetine  (CYMBALTA ) 30 MG capsule; Take 3 capsules (90 mg total) by mouth daily.  Other chronic pain -      pregabalin  (LYRICA ) 50 MG capsule; Take 1 capsule (50 mg total) by mouth at bedtime.      Mr. Alarid has a long history of depression and OCD.  Major depression with fatigue, cognitive and physical slowness, anhedonia is much worse.  Started Spravato  and improving.  Was better energy with duloxetine  90 vs Lexapro  so increased to 120 mg daily DT worsening depression.    But it was not better. So reduced it back to 90 mg daily. Dep has been much worse, in fact, his worst depression ever in 2025. Spravato  He has some compulsive checking and obsessions around the house maintenance.  When travels then tends to have less OCD bc triggered less.    Reduced Spravato to 56 mg bc seemed quite sedated recently with 84 mg .  Not as much at end of session today. The patient experienced the typical dissociation which gradually resolved over the 2-hour period of observation.  There were no complications.  Specifically the patient did not have nausea or vomiting or headache.  Blood pressures remained within normal ranges at the 40-minute and 2-hour follow-up intervals.  By the time the 2-hour observation period was met the patient was alert and oriented and able to exit without assistance.  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  See nursing note for further details. Emphasized need to relax with Spravato and not return calls, read, return chats, emails etc. Started Spravato 06/25/23.  Will try dropping back to weekly but increase back to 84 mg with next dose.  His OCD is persistent with checking lites, stove,  etc. .  It is better at the moment DT depression being worse.  Disc CBT techniques and potential for more therapy to address. Option Dr. Marijean.  Continue  retrial Concerta  36 mg AM He wants to use it for depression and cognitive concerns.  Discussed potential benefits, risks, and side effects of  stimulants with patient to include increased heart rate, palpitations, insomnia, increased anxiety, increased irritability, or decreased appetite.  Instructed patient to contact office if experiencing any significant tolerability issues.  Extensive discussion of sleep study and he has a copy.  Why is there virtually no N3 & REM sleep?  Is that affecting daytime alertness and fatigue?  Vs how much is related to mild OSA and PLMS?  Disc this in detail.  Wrote correspondence with sleep doc about it.  Options for sleep & fatigue:  Difficult to assess  bc has been out of country and then sick for 3 weeks.   Focus on reduction in OSA Focus on improving deep stage sleep.  ? Rx low dose mirtazapine or alternatives Focus on leg movements (hx RLS/PLMS) continue Trial as had been suggested Lyrica  for FM type sx and may help RLS/PLMS.  Better dreaming on 100 mg HS and too sleepy on 150.  Decreased  to 100 mg HS for sleep and back pain and RLS No ropinirole  needed.   Disc SE.  Not having any.   Use LED Xanax  and try to avoid BZ daytime bc fatigue.  He doesn't use it much daythime.  Using 0..25-0.5 mg at night.  May need less with more Lyrica  at HS and try to minimize.  No hangover. We discussed the short-term risks associated with benzodiazepines including sedation and increased fall risk among others.  Discussed long-term side effect risk including dependence, potential withdrawal symptoms, and the potential eventual dose-related risk of dementia.  But recent studies from 2020 dispute this association between benzodiazepines and dementia risk. Newer studies in 2020 do not support an association with dementia.  Stimulant partially successfully used off label to augment antidepressants for depression and have resulted in improved productivity and attention.    Previous screening of memory was not suggestive of any neuro degenerative process: Mini-Mental status exam 28/30 on 10/27/20.   Recent cognition worse as  dep worsened.  Also gait is short and slow.   neuro appt.  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  Plan:  no med changes.   continue duloxetine  to 120 mg daily.  Off Lexapro .  Lyrica  100 mg HS.  resumed MPH ER 36 mg AM   Spravato disc in detail.  He wants to continue.  He and wife notice he's better.  Administer now drop back to weekly  He's still improving.  Will  increase back to 84 mg .  He is tolerating it better  Follow-up   Lorene Macintosh MD, DFAPA.  Please see After Visit Summary for patient specific instructions.  Future Appointments  Date Time Provider Department Center  09/04/2023 12:30 PM Annett Sheffield SAILOR, Jolly [facility]  09/06/2023 12:30 PM Annett Sheffield SAILOR, PT [facility]  09/09/2023  9:30 AM Annett Sheffield SAILOR, PT [facility]  09/17/2023  1:00 PM Cottle, Lorene KANDICE Raddle., MD [facility] None  10/15/2023  1:30 PM Cottle, Lorene KANDICE Raddle., MD [facility] None  01/30/2024  3:00 PM Tat, Asberry RAMAN, DO [facility] None     No orders of the defined types were placed in this encounter.      -------------------------------

## 2023-09-04 ENCOUNTER — Ambulatory Visit: Admitting: Physical Therapy

## 2023-09-04 ENCOUNTER — Encounter: Payer: Self-pay | Admitting: Physical Therapy

## 2023-09-04 DIAGNOSIS — R2681 Unsteadiness on feet: Secondary | ICD-10-CM | POA: Diagnosis not present

## 2023-09-04 DIAGNOSIS — M6281 Muscle weakness (generalized): Secondary | ICD-10-CM | POA: Diagnosis not present

## 2023-09-04 DIAGNOSIS — R293 Abnormal posture: Secondary | ICD-10-CM | POA: Diagnosis not present

## 2023-09-04 DIAGNOSIS — R2689 Other abnormalities of gait and mobility: Secondary | ICD-10-CM | POA: Diagnosis not present

## 2023-09-04 NOTE — Therapy (Signed)
 OUTPATIENT PHYSICAL THERAPY NEURO TREATMENT   Patient Name: Eric [name] MRN: [id number] DOB:08/03/1950, 73 y.o., male Today's Date: 09/04/2023   PCP: Regino Slater, MD   REFERRING PROVIDER: Evonnie Asberry RAMAN, DO   END OF SESSION:  PT End of Session - 09/04/23 1234     Visit Number 12    Number of Visits 13    Date for PT Re-Evaluation 09/21/23    Authorization Type [employer]    Progress Note Due on Visit 9   performed on visit 9   PT Start Time 1232    PT Stop Time 1315   5 minutes non-billable due to PT helping another PT   PT Time Calculation (min) 43 min    Activity Tolerance Patient tolerated treatment well    Behavior During Therapy WFL for tasks assessed/performed;Flat affect            Past Medical History:  Diagnosis Date   Allergy    Anemia    iron- pt denies    Anxiety    Aortic cusp regurgitation    Carotid artery occlusion    Constipation    Coronary artery stenosis    Hyperlipidemia    Lactose intolerance    Major depression, recurrent, chronic (HCC)    Obesity    OCD (obsessive compulsive disorder)    OSA (obstructive sleep apnea)    Other chronic pain    Periodic limb movement disorder    Periodic limb movements of sleep    Prediabetes    Pure hypercholesterolemia    Restless legs    Sleep apnea    wears cpap    Vitamin D deficiency    Past Surgical History:  Procedure Laterality Date   COLONOSCOPY     DG BARIUM ENEMA ([facility] HX)     10-19-2010- turtous, elongated colon    HEMORRHOID SURGERY     TONSILLECTOMY  1960   Patient Active Problem List   Diagnosis Date Noted   Vitamin D deficiency 03/30/2020   Pure hypercholesterolemia 03/30/2020   Prediabetes 03/30/2020   Occlusion and stenosis of bilateral carotid arteries 03/30/2020   Obstructive sleep apnea syndrome 03/30/2020   Aortic cusp regurgitation 03/30/2020   Restless legs syndrome 03/30/2020   Moderate recurrent major depression (HCC)  03/30/2020   Chronic pain 03/30/2020   OCD (obsessive compulsive disorder) 10/15/2017   Family history of ovarian cancer 04/19/2011   Family history of breast cancer 04/19/2011    ONSET DATE: 07/12/2023   REFERRING DIAG: G20.C (ICD-10-CM) - Primary parkinsonism (HCC)   THERAPY DIAG:  Unsteadiness on feet  Other abnormalities of gait and mobility  Abnormal posture  Muscle weakness (generalized)  Rationale for Evaluation and Treatment: Rehabilitation  SUBJECTIVE:  SUBJECTIVE STATEMENT: No falls. Has signed up for the syposium. Got dizzy yesterday from the medication. Not feeling dizzy today.   Pt accompanied by: Self   PERTINENT HISTORY: PMH: Parkinsonism (Possibly due to exposure to antipsychotic medication), tardive dyskinesias, restless leg syndrome, CAD, pre-diabetes, obesity, OCD, major depression,HLD  He was not exposed to anything for very long, but wife reports that symptoms have really been developing over the last 5 to 6 weeks. He was on Abilify  for a very short time in February and March and then was on Vraylar  from March until June. The symptoms developed around that time. He is off of the Vraylar  now (just went off of it)   Per Dr. Evonnie regarding abnormal brain scan:  It does appear that there is heavy basal ganglia calcification.  We can certainly confirm this with CT.  While Fahrs disease is on the differential, the timeline for his symptoms really correlates more with the timeline for when he was placed on antipsychotic medication.   PAIN:  Are you having pain? No  PRECAUTIONS: Fall  FALLS: Has patient fallen in last 6 months? No and almost falls grabbing the wall   LIVING ENVIRONMENT: Lives with: lives with their spouse Lives in: House/apartment Stairs: Yes: Internal: 12  steps; on left going up and External: 4 and 5 steps; none Has following equipment at home: Single point cane and Grab bars  PLOF: Independent and Leisure: doesn't have any hobbies except cleaning up the house  PATIENT GOALS: Wants to get back to his regular gait and his regular walk so he can go on trips   OBJECTIVE:  Note: Objective measures were completed at Evaluation unless otherwise noted.  DIAGNOSTIC FINDINGS: MRI brain: IMPRESSION: 1. No acute intracranial abnormality. 2. 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 Head CT may be helpful for further assessment of the degree of mineralization. 3. Findings of chronic small vessel ischemia.  COGNITION: Overall cognitive status: Within functional limits for tasks assessed   SENSATION: WFL  COORDINATION: Heel to shin: some bradykinesia RLE RAM: dysmetric BUE     POSTURE: rounded shoulders, forward head, and posterior pelvic tilt   LOWER EXTREMITY MMT:    MMT Right Eval Left Eval  Hip flexion 3- 4  Hip extension    Hip abduction    Hip adduction    Hip internal rotation    Hip external rotation    Knee flexion 4 5  Knee extension 4 4  Ankle dorsiflexion 5 5  Ankle plantarflexion    Ankle inversion    Ankle eversion    (Blank rows = not tested)  BED MOBILITY:   Pt reports some difficulties getting under the sheets    TRANSFERS: Sit to stand: Modified independence  Assistive device utilized: None     Stand to sit: Modified independence  Assistive device utilized: None     Pt reports difficulties getting up from lower surfaces or in and out of the car.   GAIT: Findings: Gait Characteristics: decreased arm swing- Right, decreased arm swing- Left, decreased step length- Right, decreased step length- Left, decreased stride length, decreased hip/knee flexion- Right, decreased hip/knee flexion-  Left, decreased ankle dorsiflexion- Right, decreased ankle dorsiflexion- Left, Right foot flat, Left foot flat, shuffling, decreased trunk rotation, trunk flexed, and narrow BOS, Distance walked: Clinic distances, Assistive device utilized:Single point cane and None, Level of assistance: CGA, and Comments: Pt with very shuffled steps. When  pt gets single HHA from therapist for a couple steps, pt able to demo improved step length   FUNCTIONAL TESTS:  5 times sit to stand: 15.6 seconds with no UE support, incr forward flexed posture in standing  Timed up and go (TUG): 27.4 seconds with no AD, CGA with en bloc turning 10 meter walk test: 62 seconds with no AD and CGA = .52 ft/sec   VITALS  There were no vitals filed for this visit.                                                                                                                               TREATMENT DATE:   Therapeutic Activity:  Pt asking about using his rollator when working with his trainer or no AD, pt reports that they are walking on gait outdoors on the sidewalk for 1/3 a mile, discussed use of rollator esp on outdoor surfaces to decr fall risk and to work on gait efficiency/larger strides at this time as pt has a more shuffled gait with no AD, pt verbalized understanding  Went over rollator height and put screws in pt's rollator as pt's rollator initially too high, once adjusted it was at wrist height for pt (or slightly higher which pt prefers) Discussed PWR moves class and what it entails  Discussed that due to pt's current mobility and fall risk, would want pt to bring his rollator to class and for him to perform the modified versions (rather than getting on the floor). Pt reports that he can get on and off the floor, but can be unsteady. Did not get the chance to assess floor transfers today, but if pt is feeling unsteady with these then PT would recommend doing the modified versions for safety  Discussed POC going forwards  - will assess LTGs at next session and then determine either D/C or re-cert depending on goal progress. Educated on PD screens as a way of follow-up and would also get those scheduled with all 3 disciplines    NMR:  Performed modified PWR moves as they would be in the PWR moves class as pt is interested in this class if it would help him move better   Pt performs PWR! Moves in modified supine position (leaning forwards over physioball), performed 20 reps of each    PWR! Up for improved posture  PWR! Rock for improved weighshifting  PWR! Twist for improved trunk rotation   PWR! Step for improved step initiation     Pt performs PWR! Moves in modified quadruped position (with UE support on rollator), performed 20 reps of each    PWR! Up for improved posture  PWR! Rock for improved weighshifting  Educated on purpose of each PWR moves   PATIENT EDUCATION: Education details: See therapeutic activity section, continue HEP  Person educated: Patient Education method: Medical illustrator Education comprehension: verbalized understanding, returned demonstration, verbal cues required, and needs further education  HOME EXERCISE  PROGRAM: Standing PWR Moves   Access Code: [email or web address] URL: [email or web address] Date: 08/19/2023 Prepared by: Sheffield Senate  Exercises - Standing Marching  - 1-2 x daily - 5 x weekly - 4 sets  GOALS: Goals reviewed with patient? Yes  SHORT TERM GOALS: Target date: 08/13/2023   1. Pt will be independent with initial HEP with gait, balance, strength in order to build upon functional gains made in therapy. Baseline: pt reports doing standing PWR moves every other day Goal status: PARTIALLY MET  2.  Pt will improve TUG time to 22 seconds or less with no AD vs. LRAD in order to demo decrease fall risk. Baseline: 27.4 seconds with no AD, CGA  16.6 seconds with no AD SBA/CGA (7/23) Goal status: MET  3.  Pt will improve gait speed  with LRAD to at least 1.3 ft/sec in order to demo decr fall risk/improved household mobility.  Baseline: 62 seconds with no AD = .52 ft/sec and CGA   21.7 seconds with rollator = 1.51 ft/sec Goal status: MET  4.  Pt will ambulate at least 230' with supervision with LRAD over level indoor surfaces in order to demo improved household mobility.  Baseline: pt ambulates with no AD with CGA   Ambulates 230' with supervision and rollator  Goal status: MET   LONG TERM GOALS: Target date: 09/03/2023   Pt will be independent with final HEP with gait, balance, strength in order to build upon functional gains made in therapy. Baseline:  Goal status: INITIAL  2.  Pt will improve TUG time to 13.5 seconds or less with no AD vs. LRAD in order to demo decrease fall risk. Baseline: 27.4 seconds with no AD, CGA  16.6 seconds with no AD SBA/CGA (7/23) Goal status: REVISED  3.  Pt will improve 5x sit<>stand to less than or equal to 13 sec to demonstrate improved functional strength and transfer efficiency.  Baseline: 15.6 seconds with no UE support, incr forward flexed posture in standing  11.3 seconds with no UE support (7/30) Goal status: MET  4.  Pt will improve gait speed with LRAD to at least 1.8 ft/sec in order to demo decr fall risk  Baseline: 62 seconds with no AD = .52 ft/sec and CGA  Gait speed with rollator: 34.4 seconds = .95 ft/sec (7/30) Goal status: IN PROGRESS  5.  Pt will ambulate at least 500' with supervision with LRAD over outdoor unlevel surfaces in order to demo improved community mobility.  Baseline:  Goal status: INITIAL    ASSESSMENT:  CLINICAL IMPRESSION: Pt reporting that he is potentially interested in attending the PWR moves classes. Educated pt what this class would entail and he would need to bring his rollator to class and participate in the modified versions instead of getting on and off the floor. Began to go through the modified versions for supine and  quadruped positions. Pt tolerated well, will have to perform remainder at next session. Will check LTGs at next session and depending on pt's progress, will determine either D/C or re-cert. Pt in agreement with plan, will continue per POC.    OBJECTIVE IMPAIRMENTS: Abnormal gait, decreased activity tolerance, decreased balance, decreased coordination, decreased knowledge of condition, decreased knowledge of use of DME, decreased mobility, difficulty walking, decreased strength, decreased safety awareness, impaired flexibility, and postural dysfunction.   ACTIVITY LIMITATIONS: transfers, bed mobility, and locomotion level  PARTICIPATION LIMITATIONS: shopping, community activity, and yard work  PERSONAL FACTORS: Age, Behavior pattern, Past/current experiences, Time since onset of  injury/illness/exacerbation, and 3+ comorbidities: arkinsonism (Possibly due to exposure to antipsychotic medication), tardive dyskinesias, restless leg syndrome, CAD, pre-diabetes, obesity, OCD, major depression,HLD are also affecting patient's functional outcome.   REHAB POTENTIAL: Good  CLINICAL DECISION MAKING: Evolving/moderate complexity  EVALUATION COMPLEXITY: Moderate  PLAN:  PT FREQUENCY: 2x/week  PT DURATION: 8 weeks  PLANNED INTERVENTIONS: 97164- PT Re-evaluation, 97110-Therapeutic exercises, 97530- Therapeutic activity, 97112- Neuromuscular re-education, 97535- Self Care, [id number]- Manual therapy, [phone]- Gait training, Patient/Family education, Balance training, Stair training, and DME instructions  PLAN FOR NEXT SESSION: CHECK LTGS AND LIKELY RE-CERT  Work on [employer] moves modified for [employer], work on floor transfers   Sheffield LOISE Senate, PT, DPT 09/04/2023, 3:35 PM

## 2023-09-06 ENCOUNTER — Encounter: Payer: Self-pay | Admitting: Physical Therapy

## 2023-09-06 ENCOUNTER — Ambulatory Visit: Admitting: Physical Therapy

## 2023-09-06 DIAGNOSIS — R2689 Other abnormalities of gait and mobility: Secondary | ICD-10-CM | POA: Diagnosis not present

## 2023-09-06 DIAGNOSIS — M6281 Muscle weakness (generalized): Secondary | ICD-10-CM | POA: Diagnosis not present

## 2023-09-06 DIAGNOSIS — R2681 Unsteadiness on feet: Secondary | ICD-10-CM | POA: Diagnosis not present

## 2023-09-06 DIAGNOSIS — R293 Abnormal posture: Secondary | ICD-10-CM

## 2023-09-06 NOTE — Therapy (Signed)
 OUTPATIENT PHYSICAL THERAPY NEURO TREATMENT/RE-CERT:   Patient Name: Eric [name] MRN: [id number] DOB:1950/11/07, 73 y.o., male Today's Date: 09/06/2023   PCP: Regino Slater, MD   REFERRING PROVIDER: Evonnie Asberry RAMAN, DO   END OF SESSION:  PT End of Session - 09/06/23 1243     Visit Number 13    Number of Visits 17    Date for PT Re-Evaluation 10/06/23   per re-cert on 1/84/74   Authorization Type [employer]    Progress Note Due on Visit 9   performed on visit 9   PT Start Time 1236   pt late to session   PT Stop Time 1314    PT Time Calculation (min) 38 min    Equipment Utilized During Treatment Gait belt    Activity Tolerance Patient tolerated treatment well    Behavior During Therapy WFL for tasks assessed/performed;Flat affect            Past Medical History:  Diagnosis Date   Allergy    Anemia    iron- pt denies    Anxiety    Aortic cusp regurgitation    Carotid artery occlusion    Constipation    Coronary artery stenosis    Hyperlipidemia    Lactose intolerance    Major depression, recurrent, chronic (HCC)    Obesity    OCD (obsessive compulsive disorder)    OSA (obstructive sleep apnea)    Other chronic pain    Periodic limb movement disorder    Periodic limb movements of sleep    Prediabetes    Pure hypercholesterolemia    Restless legs    Sleep apnea    wears cpap    Vitamin D deficiency    Past Surgical History:  Procedure Laterality Date   COLONOSCOPY     DG BARIUM ENEMA ([facility] HX)     10-19-2010- turtous, elongated colon    HEMORRHOID SURGERY     TONSILLECTOMY  1960   Patient Active Problem List   Diagnosis Date Noted   Vitamin D deficiency 03/30/2020   Pure hypercholesterolemia 03/30/2020   Prediabetes 03/30/2020   Occlusion and stenosis of bilateral carotid arteries 03/30/2020   Obstructive sleep apnea syndrome 03/30/2020   Aortic cusp regurgitation 03/30/2020   Restless legs syndrome 03/30/2020    Moderate recurrent major depression (HCC) 03/30/2020   Chronic pain 03/30/2020   OCD (obsessive compulsive disorder) 10/15/2017   Family history of ovarian cancer 04/19/2011   Family history of breast cancer 04/19/2011    ONSET DATE: 07/12/2023   REFERRING DIAG: G20.C (ICD-10-CM) - Primary parkinsonism (HCC)   THERAPY DIAG:  Unsteadiness on feet  Other abnormalities of gait and mobility  Muscle weakness (generalized)  Abnormal posture  Rationale for Evaluation and Treatment: Rehabilitation  SUBJECTIVE:  SUBJECTIVE STATEMENT: No falls. Has signed up for the syposium. Got dizzy yesterday from the medication. Not feeling dizzy today.   Pt accompanied by: Self and spouse   PERTINENT HISTORY: PMH: Parkinsonism (Possibly due to exposure to antipsychotic medication), tardive dyskinesias, restless leg syndrome, CAD, pre-diabetes, obesity, OCD, major depression,HLD  He was not exposed to anything for very long, but wife reports that symptoms have really been developing over the last 5 to 6 weeks. He was on Abilify  for a very short time in February and March and then was on Vraylar  from March until June. The symptoms developed around that time. He is off of the Vraylar  now (just went off of it)   Per Dr. Evonnie regarding abnormal brain scan:  It does appear that there is heavy basal ganglia calcification.  We can certainly confirm this with CT.  While Fahrs disease is on the differential, the timeline for his symptoms really correlates more with the timeline for when he was placed on antipsychotic medication.   PAIN:  Are you having pain? No  PRECAUTIONS: Fall  FALLS: Has patient fallen in last 6 months? No and almost falls grabbing the wall   LIVING ENVIRONMENT: Lives with: lives with their  spouse Lives in: House/apartment Stairs: Yes: Internal: 12 steps; on left going up and External: 4 and 5 steps; none Has following equipment at home: Single point cane and Grab bars  PLOF: Independent and Leisure: doesn't have any hobbies except cleaning up the house  PATIENT GOALS: Wants to get back to his regular gait and his regular walk so he can go on trips   OBJECTIVE:  Note: Objective measures were completed at Evaluation unless otherwise noted.  DIAGNOSTIC FINDINGS: MRI brain: IMPRESSION: 1. No acute intracranial abnormality. 2. 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 Head CT may be helpful for further assessment of the degree of mineralization. 3. Findings of chronic small vessel ischemia.  COGNITION: Overall cognitive status: Within functional limits for tasks assessed   SENSATION: WFL  COORDINATION: Heel to shin: some bradykinesia RLE RAM: dysmetric BUE     POSTURE: rounded shoulders, forward head, and posterior pelvic tilt   LOWER EXTREMITY MMT:    MMT Right Eval Left Eval  Hip flexion 3- 4  Hip extension    Hip abduction    Hip adduction    Hip internal rotation    Hip external rotation    Knee flexion 4 5  Knee extension 4 4  Ankle dorsiflexion 5 5  Ankle plantarflexion    Ankle inversion    Ankle eversion    (Blank rows = not tested)  BED MOBILITY:   Pt reports some difficulties getting under the sheets    TRANSFERS: Sit to stand: Modified independence  Assistive device utilized: None     Stand to sit: Modified independence  Assistive device utilized: None     Pt reports difficulties getting up from lower surfaces or in and out of the car.   GAIT: Findings: Gait Characteristics: decreased arm swing- Right, decreased arm swing- Left, decreased step length- Right, decreased step length- Left, decreased stride length,  decreased hip/knee flexion- Right, decreased hip/knee flexion- Left, decreased ankle dorsiflexion- Right, decreased ankle dorsiflexion- Left, Right foot flat, Left foot flat, shuffling, decreased trunk rotation, trunk flexed, and narrow BOS, Distance walked: Clinic distances, Assistive device utilized:Single point cane and None, Level of assistance: CGA, and Comments: Pt with very shuffled  steps. When pt gets single HHA from therapist for a couple steps, pt able to demo improved step length   FUNCTIONAL TESTS:  5 times sit to stand: 15.6 seconds with no UE support, incr forward flexed posture in standing  Timed up and go (TUG): 27.4 seconds with no AD, CGA with en bloc turning 10 meter walk test: 62 seconds with no AD and CGA = .52 ft/sec   VITALS  There were no vitals filed for this visit.                                                                                                                               TREATMENT DATE:   Therapeutic Activity:  Discussed POC going forwards - pt has improved with his gait speed, TUG, and 5x sit <> stand, but pt reports he has not been working on his HEP at home. Will plan to add 1x week for 4 more weeks as pt is potentially interested in the PWR moves class (performing the modified version), so will review these moves and continue to work on balance. Pt in agreement with this plan and will then plan to D/C. With pt's spouse present this session, educated on purpose of PD screens and pt getting scheduled for them after D/C from PT  Pt asking about getting back to normal baseline function, reviewed drug induced PD and how everyone is different, so unsure what pt will and won't get back and how long it might take. But did continue to encourage pt to exercise and work on therapy HEP, pt verbalized understanding  Provided PD community resources for support group and different exercise opportunities in the community with pt going to look at these. Pt reports that  he has a [employer] but has not been in a long time. Discussed also trying to add in aerobic exercise to pt's exercise routine including the [employer] used in therapy or a seated recumbent bike   Goal Assessment: TUG: 14 seconds with no AD  Gait speed: 19.7 seconds with rollator = 1.66 ft/sec   NMR:  Performed modified PWR moves as they would be in the PWR moves class as pt is interested in this class if it would help him move better   Pt performs PWR! Moves in modified quadruped position (with UE support on chair), performed 20 reps of each    PWR! Twist for improved trunk rotation   PWR! Step for improved step initiation   Educated on purpose of each PWR moves  Pt reporting 5/10 level of effort, but has 9/10 RLE knee pain (pt reports that this is the norm for him)   Reviewed as HEP as pt reports he has not been doing them because he is lazy Pt performs PWR! Moves in standing position x 10 reps   PWR! Up for improved posture  PWR! Rock for improved weight shifting - cues to try to lift up heel and to add  in a heel raise    PATIENT EDUCATION: Education details: See therapeutic activity section, work on HEP at home, will add 1x week for 4 weeks and plan to D/C at end of POC, pt and pt's spouse in agreement  Person educated: Patient and Spouse Education method: Medical illustrator Education comprehension: verbalized understanding, returned demonstration, verbal cues required, and needs further education  HOME EXERCISE PROGRAM: Standing PWR Moves   Access Code: [email or web address] URL: [email or web address] Date: 08/19/2023 Prepared by: Sheffield Senate  Exercises - Standing Marching  - 1-2 x daily - 5 x weekly - 4 sets  GOALS: Goals reviewed with patient? Yes  SHORT TERM GOALS: Target date: 08/13/2023   1. Pt will be independent with initial HEP with gait, balance, strength in order to build upon functional gains made in therapy. Baseline: pt reports  doing standing PWR moves every other day Goal status: PARTIALLY MET  2.  Pt will improve TUG time to 22 seconds or less with no AD vs. LRAD in order to demo decrease fall risk. Baseline: 27.4 seconds with no AD, CGA  16.6 seconds with no AD SBA/CGA (7/23) Goal status: MET  3.  Pt will improve gait speed with LRAD to at least 1.3 ft/sec in order to demo decr fall risk/improved household mobility.  Baseline: 62 seconds with no AD = .52 ft/sec and CGA   21.7 seconds with rollator = 1.51 ft/sec Goal status: MET  4.  Pt will ambulate at least 230' with supervision with LRAD over level indoor surfaces in order to demo improved household mobility.  Baseline: pt ambulates with no AD with CGA   Ambulates 230' with supervision and rollator  Goal status: MET   LONG TERM GOALS: Target date: 09/03/2023   Pt will be independent with final HEP with gait, balance, strength in order to build upon functional gains made in therapy. Baseline: pt has not been performing  Goal status: NOT MET  2.  Pt will improve TUG time to 13.5 seconds or less with no AD vs. LRAD in order to demo decrease fall risk. Baseline: 27.4 seconds with no AD, CGA  16.6 seconds with no AD SBA/CGA (7/23)  14 seconds with no AD (8/15) Goal status: NOT MET  3.  Pt will improve 5x sit<>stand to less than or equal to 13 sec to demonstrate improved functional strength and transfer efficiency.  Baseline: 15.6 seconds with no UE support, incr forward flexed posture in standing  11.3 seconds with no UE support (7/30) Goal status: MET  4.  Pt will improve gait speed with LRAD to at least 1.8 ft/sec in order to demo decr fall risk  Baseline: 62 seconds with no AD = .52 ft/sec and CGA  Gait speed with rollator: 34.4 seconds = .95 ft/sec (7/30)  19.7 seconds with rollator = 1.66 ft/sec (8/15) Goal status: NOT MET  5.  Pt will ambulate at least 500' with supervision with LRAD over outdoor unlevel surfaces in order to demo  improved community mobility.  Baseline: has been performing with rollator mod I  Goal status: MET   PER -CERT LONG TERM GOALS: Target date: 10/04/2023   Pt will be independent with final HEP with gait, balance, strength in order to build upon functional gains made in therapy and verbalize going to a community fitness class.  Baseline: pt has not been performing  Goal status: ON-GOING  2.  Pt will improve TUG time to 13 seconds or less with no AD in order to  demo decrease fall risk. Baseline:  14 seconds with no AD (8/15) Goal status: REVISED   4.  Pt will improve gait speed with LRAD to at least 1.8 ft/sec in order to demo decr fall risk  Baseline: 19.7 seconds with rollator = 1.66 ft/sec (8/15) Goal status: REVISED     ASSESSMENT:  CLINICAL IMPRESSION: Today's skilled session focused on assessing pt's LTGs. Pt has met LTG #3 and #5 in regards 5x sit <> stand and gait with rollator for longer community distances. Pt improved gait speed with rollator to 1.66 ft/sec, but not quite to goal level (was .52 ft/sec at eval with no AD). Pt also improved TUG to 14 seconds with no AD, decr pt's risk for falls. Pt has not met goal in regards to therapy HEP and has not been performing at home. Pt has expressed interest in PWR moves classes, so began to review modified versions of these that pt can perform instead of getting down on the floor during these classes. Also provided community resources for additional classes in the community. Encouraged pt to perform HEP as he has not been performing at home. Will re-cert for an additional 1x week for 4 weeks to further address balance, mobility, and gait to decr fall risk and to practice different variations of the PWR moves if pt is interested in the PWR moves class. Will then plan to D/C at that time and have pt schedule for screens in 6 mo. Pt in agreement with plan. LTGs updated for re-cert.    OBJECTIVE IMPAIRMENTS: Abnormal gait, decreased  activity tolerance, decreased balance, decreased coordination, decreased knowledge of condition, decreased knowledge of use of DME, decreased mobility, difficulty walking, decreased strength, decreased safety awareness, impaired flexibility, and postural dysfunction.   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   REHAB POTENTIAL: Good  CLINICAL DECISION MAKING: Evolving/moderate complexity  EVALUATION COMPLEXITY: Moderate  PLAN:  PT FREQUENCY: 2x/week  PT DURATION: 8 weeks plus 1x week for 4 weeks per re-cert on 1/84/74  PLANNED INTERVENTIONS: 97164- PT Re-evaluation, 97110-Therapeutic exercises, 97530- Therapeutic activity, 97112- Neuromuscular re-education, 97535- Self Care, [id number]- Manual therapy, [phone]- Gait training, Patient/Family education, Balance training, Stair training, and DME instructions  PLAN FOR NEXT SESSION:   Work on [employer] moves modified for [employer], work on balance, stride length  Will D/C at end of scheduled appts with PD screens in 6 months   Sheffield LOISE Senate, PT, DPT 09/06/2023, 2:10 PM

## 2023-09-09 ENCOUNTER — Ambulatory Visit: Payer: Self-pay | Admitting: Physical Therapy

## 2023-09-09 ENCOUNTER — Encounter: Payer: Self-pay | Admitting: Physical Therapy

## 2023-09-09 DIAGNOSIS — M6281 Muscle weakness (generalized): Secondary | ICD-10-CM | POA: Diagnosis not present

## 2023-09-09 DIAGNOSIS — R2681 Unsteadiness on feet: Secondary | ICD-10-CM

## 2023-09-09 DIAGNOSIS — R293 Abnormal posture: Secondary | ICD-10-CM | POA: Diagnosis not present

## 2023-09-09 DIAGNOSIS — R2689 Other abnormalities of gait and mobility: Secondary | ICD-10-CM

## 2023-09-09 NOTE — Therapy (Signed)
 OUTPATIENT PHYSICAL THERAPY NEURO TREATMENT   Patient Name: Eric [name] MRN: [id number] DOB:10/09/1950, 73 y.o., male Today's Date: 09/09/2023   PCP: Regino Slater, MD   REFERRING PROVIDER: Evonnie Asberry RAMAN, DO   END OF SESSION:  PT End of Session - 09/09/23 0935     Visit Number 14    Number of Visits 17    Date for PT Re-Evaluation 10/06/23   per re-cert on 1/84/74   Authorization Type [employer]    Progress Note Due on Visit 9   performed on visit 9   PT Start Time 0933    PT Stop Time 1013    PT Time Calculation (min) 40 min    Equipment Utilized During Treatment Gait belt    Activity Tolerance Patient tolerated treatment well    Behavior During Therapy WFL for tasks assessed/performed;Flat affect            Past Medical History:  Diagnosis Date   Allergy    Anemia    iron- pt denies    Anxiety    Aortic cusp regurgitation    Carotid artery occlusion    Constipation    Coronary artery stenosis    Hyperlipidemia    Lactose intolerance    Major depression, recurrent, chronic (HCC)    Obesity    OCD (obsessive compulsive disorder)    OSA (obstructive sleep apnea)    Other chronic pain    Periodic limb movement disorder    Periodic limb movements of sleep    Prediabetes    Pure hypercholesterolemia    Restless legs    Sleep apnea    wears cpap    Vitamin D deficiency    Past Surgical History:  Procedure Laterality Date   COLONOSCOPY     DG BARIUM ENEMA ([facility] HX)     10-19-2010- turtous, elongated colon    HEMORRHOID SURGERY     TONSILLECTOMY  1960   Patient Active Problem List   Diagnosis Date Noted   Vitamin D deficiency 03/30/2020   Pure hypercholesterolemia 03/30/2020   Prediabetes 03/30/2020   Occlusion and stenosis of bilateral carotid arteries 03/30/2020   Obstructive sleep apnea syndrome 03/30/2020   Aortic cusp regurgitation 03/30/2020   Restless legs syndrome 03/30/2020   Moderate recurrent major  depression (HCC) 03/30/2020   Chronic pain 03/30/2020   OCD (obsessive compulsive disorder) 10/15/2017   Family history of ovarian cancer 04/19/2011   Family history of breast cancer 04/19/2011    ONSET DATE: 07/12/2023   REFERRING DIAG: G20.C (ICD-10-CM) - Primary parkinsonism (HCC)   THERAPY DIAG:  Unsteadiness on feet  Other abnormalities of gait and mobility  Muscle weakness (generalized)  Abnormal posture  Rationale for Evaluation and Treatment: Rehabilitation  SUBJECTIVE:  SUBJECTIVE STATEMENT: No falls. Tried the exercises at home, but not sure if he is doing them right.   Pt accompanied by: Self    PERTINENT HISTORY: PMH: Parkinsonism (Possibly due to exposure to antipsychotic medication), tardive dyskinesias, restless leg syndrome, CAD, pre-diabetes, obesity, OCD, major depression,HLD  He was not exposed to anything for very long, but wife reports that symptoms have really been developing over the last 5 to 6 weeks. He was on Abilify  for a very short time in February and March and then was on Vraylar  from March until June. The symptoms developed around that time. He is off of the Vraylar  now (just went off of it)   Per Dr. Evonnie regarding abnormal brain scan:  It does appear that there is heavy basal ganglia calcification.  We can certainly confirm this with CT.  While Fahrs disease is on the differential, the timeline for his symptoms really correlates more with the timeline for when he was placed on antipsychotic medication.   PAIN:  Are you having pain? Yes, generalized pain  PRECAUTIONS: Fall  FALLS: Has patient fallen in last 6 months? No and almost falls grabbing the wall   LIVING ENVIRONMENT: Lives with: lives with their spouse Lives in: House/apartment Stairs: Yes:  Internal: 12 steps; on left going up and External: 4 and 5 steps; none Has following equipment at home: Single point cane and Grab bars  PLOF: Independent and Leisure: doesn't have any hobbies except cleaning up the house  PATIENT GOALS: Wants to get back to his regular gait and his regular walk so he can go on trips   OBJECTIVE:  Note: Objective measures were completed at Evaluation unless otherwise noted.  DIAGNOSTIC FINDINGS: MRI brain: IMPRESSION: 1. No acute intracranial abnormality. 2. 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 Head CT may be helpful for further assessment of the degree of mineralization. 3. Findings of chronic small vessel ischemia.  COGNITION: Overall cognitive status: Within functional limits for tasks assessed   SENSATION: WFL  COORDINATION: Heel to shin: some bradykinesia RLE RAM: dysmetric BUE     POSTURE: rounded shoulders, forward head, and posterior pelvic tilt   LOWER EXTREMITY MMT:    MMT Right Eval Left Eval  Hip flexion 3- 4  Hip extension    Hip abduction    Hip adduction    Hip internal rotation    Hip external rotation    Knee flexion 4 5  Knee extension 4 4  Ankle dorsiflexion 5 5  Ankle plantarflexion    Ankle inversion    Ankle eversion    (Blank rows = not tested)  BED MOBILITY:   Pt reports some difficulties getting under the sheets    TRANSFERS: Sit to stand: Modified independence  Assistive device utilized: None     Stand to sit: Modified independence  Assistive device utilized: None     Pt reports difficulties getting up from lower surfaces or in and out of the car.   GAIT: Findings: Gait Characteristics: decreased arm swing- Right, decreased arm swing- Left, decreased step length- Right, decreased step length- Left, decreased stride length, decreased hip/knee flexion- Right, decreased  hip/knee flexion- Left, decreased ankle dorsiflexion- Right, decreased ankle dorsiflexion- Left, Right foot flat, Left foot flat, shuffling, decreased trunk rotation, trunk flexed, and narrow BOS, Distance walked: Clinic distances, Assistive device utilized:Single point cane and None, Level of assistance: CGA, and Comments: Pt with very shuffled steps.  When pt gets single HHA from therapist for a couple steps, pt able to demo improved step length   FUNCTIONAL TESTS:  5 times sit to stand: 15.6 seconds with no UE support, incr forward flexed posture in standing  Timed up and go (TUG): 27.4 seconds with no AD, CGA with en bloc turning 10 meter walk test: 62 seconds with no AD and CGA = .52 ft/sec   VITALS  There were no vitals filed for this visit.                                                                                                                               TREATMENT DATE:   Therapeutic Exercise:  NuStep with BLE/BUE at gear 6.0 for 8 minutes for neural priming, reciprocal movement patterns, activity tolerance, and incr amplitude of stepping. Pt reporting RPE as 6-7/10  NMR:  Pt ambulating with rollator, needing cues for incr stride length/heel strike as pt frequently catching his R toe today, but able to catch himself with rollator.   Pt wishing to review all his exercises from HEP:  Pt performs PWR! Moves in standing position with chair in front as needed for balance:    PWR! Up for improved posture 15 reps   PWR! Rock for improved weight shifting 10 reps, cues for performing with a big rock lifting up his heel   PWR! Twist for improved trunk rotation, 15 reps, cues to reset in the middle with tall posture each time   PWR! Step for improved step initiation 10 reps, performed an additional 10 reps with use of yard stick as visual cue to step over, pt needing to use UE support for balance, needing verbal/demo cues throughout for incr amplitude of stepping and picking up  legs. Discussed would rather have pt do 10 reps at home focusing on larger amplitude movements each time and more effort compared to 20 reps where movements may be smaller   Cues provided for technique and larger amplitude movements   Access Code: [email or web address] URL: [email or web address] Date: 08/19/2023 Prepared by: Sheffield Senate  Exercises - Standing Marching  - 1-2 x daily - 5 x weekly - 4 sets - performing at countertop, needs cues to perform as a walking march and hip flexion and kicking leg out into a heel strike, performed down and back x3 reps   With air ex, 10 reps sit <> stands holding 10 lb medicine ball and slamming it to floor, cued for big movements reaching ball overhead before throwing it to floor, performed an additional 10 reps with gentle twist and tossing it to floor at a diagonal, alternating between R/L, pt reporting RPE as 7/10   PATIENT EDUCATION: Education details: Continue HEP Person educated: Patient Education method: Medical illustrator Education comprehension: verbalized understanding, returned demonstration, verbal cues required, and needs further education  HOME EXERCISE PROGRAM: Standing PWR Moves   Access Code: [email or web address] URL: [email or web address] Date: 08/19/2023 Prepared by: Sheffield  Calleigh Lafontant  Exercises - Standing Marching  - 1-2 x daily - 5 x weekly - 4 sets  GOALS: Goals reviewed with patient? Yes  SHORT TERM GOALS: Target date: 08/13/2023   1. Pt will be independent with initial HEP with gait, balance, strength in order to build upon functional gains made in therapy. Baseline: pt reports doing standing PWR moves every other day Goal status: PARTIALLY MET  2.  Pt will improve TUG time to 22 seconds or less with no AD vs. LRAD in order to demo decrease fall risk. Baseline: 27.4 seconds with no AD, CGA  16.6 seconds with no AD SBA/CGA (7/23) Goal status: MET  3.  Pt will improve gait speed with LRAD to at  least 1.3 ft/sec in order to demo decr fall risk/improved household mobility.  Baseline: 62 seconds with no AD = .52 ft/sec and CGA   21.7 seconds with rollator = 1.51 ft/sec Goal status: MET  4.  Pt will ambulate at least 230' with supervision with LRAD over level indoor surfaces in order to demo improved household mobility.  Baseline: pt ambulates with no AD with CGA   Ambulates 230' with supervision and rollator  Goal status: MET   LONG TERM GOALS: Target date: 09/03/2023   Pt will be independent with final HEP with gait, balance, strength in order to build upon functional gains made in therapy. Baseline: pt has not been performing  Goal status: NOT MET  2.  Pt will improve TUG time to 13.5 seconds or less with no AD vs. LRAD in order to demo decrease fall risk. Baseline: 27.4 seconds with no AD, CGA  16.6 seconds with no AD SBA/CGA (7/23)  14 seconds with no AD (8/15) Goal status: NOT MET  3.  Pt will improve 5x sit<>stand to less than or equal to 13 sec to demonstrate improved functional strength and transfer efficiency.  Baseline: 15.6 seconds with no UE support, incr forward flexed posture in standing  11.3 seconds with no UE support (7/30) Goal status: MET  4.  Pt will improve gait speed with LRAD to at least 1.8 ft/sec in order to demo decr fall risk  Baseline: 62 seconds with no AD = .52 ft/sec and CGA  Gait speed with rollator: 34.4 seconds = .95 ft/sec (7/30)  19.7 seconds with rollator = 1.66 ft/sec (8/15) Goal status: NOT MET  5.  Pt will ambulate at least 500' with supervision with LRAD over outdoor unlevel surfaces in order to demo improved community mobility.  Baseline: has been performing with rollator mod I  Goal status: MET   PER RE-CERT LONG TERM GOALS: Target date: 10/04/2023   Pt will be independent with final HEP with gait, balance, strength in order to build upon functional gains made in therapy and verbalize going to a community fitness  class.  Baseline: pt has not been performing  Goal status: ON-GOING  2.  Pt will improve TUG time to 13 seconds or less with no AD in order to demo decrease fall risk. Baseline:  14 seconds with no AD (8/15) Goal status: REVISED   4.  Pt will improve gait speed with LRAD to at least 1.8 ft/sec in order to demo decr fall risk  Baseline: 19.7 seconds with rollator = 1.66 ft/sec (8/15) Goal status: REVISED     ASSESSMENT:  CLINICAL IMPRESSION: Today's skilled session focused on reviewing pt's HEP (per pt request) and focus was on standing PWR moves and marching for dynamic SLS. Pt needs  cues for larger amplitude movements, esp with stepping or marching. Pt did better with use of a yard stick as visual cue reminder on picking up feet, discussed performing like this at home for incr step height. Will continue per POC.   OBJECTIVE IMPAIRMENTS: Abnormal gait, decreased activity tolerance, decreased balance, decreased coordination, decreased knowledge of condition, decreased knowledge of use of DME, decreased mobility, difficulty walking, decreased strength, decreased safety awareness, impaired flexibility, and postural dysfunction.   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   REHAB POTENTIAL: Good  CLINICAL DECISION MAKING: Evolving/moderate complexity  EVALUATION COMPLEXITY: Moderate  PLAN:  PT FREQUENCY: 2x/week  PT DURATION: 8 weeks plus 1x week for 4 weeks per re-cert on 1/84/74  PLANNED INTERVENTIONS: 97164- PT Re-evaluation,  97110-Therapeutic exercises, 97530- Therapeutic activity, 97112- Neuromuscular re-education, 97535- Self Care, [id number]- Manual therapy, [phone]- Gait training, Patient/Family education, Balance training, Stair training, and DME instructions  PLAN FOR NEXT SESSION:   Work on [employer] moves modified for [employer], work on Development worker, international aid, stride length  Will D/C at end of scheduled appts with PD screens in 6 months   Sheffield LOISE Senate, PT, DPT 09/09/2023, 11:07 AM

## 2023-09-10 ENCOUNTER — Ambulatory Visit

## 2023-09-10 ENCOUNTER — Ambulatory Visit (INDEPENDENT_AMBULATORY_CARE_PROVIDER_SITE_OTHER): Admitting: Psychiatry

## 2023-09-10 VITALS — BP 138/76 | HR 66

## 2023-09-10 DIAGNOSIS — F339 Major depressive disorder, recurrent, unspecified: Secondary | ICD-10-CM

## 2023-09-10 DIAGNOSIS — G4710 Hypersomnia, unspecified: Secondary | ICD-10-CM

## 2023-09-10 DIAGNOSIS — F900 Attention-deficit hyperactivity disorder, predominantly inattentive type: Secondary | ICD-10-CM

## 2023-09-10 DIAGNOSIS — F422 Mixed obsessional thoughts and acts: Secondary | ICD-10-CM

## 2023-09-10 DIAGNOSIS — R7989 Other specified abnormal findings of blood chemistry: Secondary | ICD-10-CM

## 2023-09-10 DIAGNOSIS — G8929 Other chronic pain: Secondary | ICD-10-CM

## 2023-09-10 DIAGNOSIS — G3184 Mild cognitive impairment, so stated: Secondary | ICD-10-CM

## 2023-09-10 DIAGNOSIS — G2581 Restless legs syndrome: Secondary | ICD-10-CM

## 2023-09-10 DIAGNOSIS — F5221 Male erectile disorder: Secondary | ICD-10-CM

## 2023-09-10 NOTE — Progress Notes (Signed)
 NURSES NOTE:         Pt arrived for his #19 Spravato Treatment for treatment resistant depression, the starting dose was 56 mg (2 of the 28 mg nasal sprays). Pt is getting 84 mg again today after receiving 3 of the 56 mg the past 3 weeks due to sedation.  Eric [name] is a patient of Dr. Calhoun so he will follow his care throughout treatments and follow ups. Pt's Spravato is a medical authorization through buy and bill.  Spravato medication is stored at treatment center per REMS/FDA guidelines. The medication is required to be locked behind two doors per REMS/FDA protocol. Medication is also disposed of properly after each use per regulations. All documentation for REMS is completed and submitted per FDA/REMS requirements.          Began taking patient's vital signs at 9:05 AM 110/72, pulse 71, SpO2 95%. Instructed patient to blow his nose if needed then recline back to a 45 degree angle. Pt is aware he is only getting 56 mg which is 2 devices instead of 3. He agreed. Gave patient first dose 28 mg nasal spray, administered in each nostril as directed and observed by nurse, waited 5 more minutes for the second dose.  After both doses given pt did not complain of any nausea/vomiting. Pt was given water, snacks, and candy.  Assessed his 40 minute vitals, 9:50 AM, 129/94, pulse 66, SpO2 96%. Pt not too sedate at this time. Explained he would be monitored for a total time of 120 minutes. Discharge vitals were taken at 11:02 AM 138/76, P 66, SpO2 95%. Dr. Geoffry came to visit with patient once his thoughts were clearer to discuss how treatment went. Recommend he go home and sleep or just relax on the couch. No driving, no intense activities. Verbalized understanding. Nurse was with pt a total of 60 minutes for clinical assessment. Pt was able to use his rolling walker on the way down to the vehicle. Instructed to call with any issues. Pt will return weekly on Tuesdays.    LOT [id number] EXP FEB 2028

## 2023-09-10 NOTE — Progress Notes (Unsigned)
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 deal with a trigger lately.  OCD driving me crazy wanting to make things perfect.   Other than the trigger had a nice time since here.  GS here for 5 days at 73 yo.  Enjoyed that.   Taking semaglutide   down to 250# from 283#. Card at [facility] says he does not have Aortic aneurysm.  Measuring is OK.  Will FU with MRI in June.   Some diarrhea lately. Cannot stop worrying.  Triggered by the plumbing issue. Meds as above.  No SE of sig.   Plan:  switched to duloxetine  to 90 mg daily.  Off Lexapro .  Others as noted. Lyrica  100 mg HS. Has held Ritalin  10 TID, can resume as long as SBP below 140 per card.  none needed ropinirole  3 mg pm.  No risperidone .  02/18/23 appt noted:  with [name] Meds: as above. Taking Ritalin  30% of night.  Prn Xanax  rarely if gets off sleep cycle. No SE.   Terribly dep but not as bad as in the past. Taking Ritalin  appears to help significantly and started doing that.  Tend to stay in bed till noon but pattern of up late.   OCD about the same with some avoidance of detail work.  Afraid I'll throw away something I need.   She doesn't know whether Ritalin  helps No sig RLS and  wife agrees. Thinks of deceased B at the holidays but worries over son Deward too.   Plan: Increase duloxetine  to 120 mg daily.  Off Lexapro .  Others as noted. Lyrica  100 mg HS.  resumed Ritalin  30 AM with some benefit. no risperidone .  03/19/23 appt noted: Psych med:  duloxetine  120, Ritalin  20 am  missing doses, Lyrica   100 HS, no risperidone , no ropinirole .   No improvement in depression since increase dose duloxetine .  More dep than usual.  Worrying about everything.  Including taxes.  All I can see is a black hole.  Making him feel negative about everything.   Keeping him from doing things.   OCD is not much different from when on Lexapro .  Wife agrees dep and distracted. Plan: resumed Ritalin  30 AM with some benefit. Resume Abilify  2 mg AM for dep.  04/16/23 appt noted:  wife here Psych med:  duloxetine  120, resumed Concerta  36 mg AM, Lyrica  100 HS, Abilify  2, no ropinirole .   Wife noted he seemed manic yesterday.  Invited window estimate against wife's will and without her input.   No mania noted until yesterday. He didn't feel manic yesterday.   He's noticed no change with Abilify  and still feels flat and a little low.  From MPH energy and mood a little better.  Sleeps until 10-11 am about 12 hours. And asleep that whole time. Wife says he doesn't eat much bc he's not awake much. Trouble with hygiene.   Plan: Meds: continue duloxetine  to 120 mg daily.  Off Lexapro .  Lyrica  100 mg HS.  resumed Ritalin  30 AM with some benefit. DC Abiilify Vraylar  1.5 mg every other day Spravato disc in detail.  He wants to pursue if not better with Vraylar .  06/18/23 appt noted: with W Med:  duloxetine  120, resumed Concerta  36 mg AM, Lyrica  100 HS, no ropinirole .   Vraylar  1.5 every other day, no benefit Spravato denied by insurance but is being appealed. More trouble walking and shorter gait.  Neuro in [place] appt in Sept.   Looking to get in in [place].  Can't be much more dep than I am.   OCD  residual when has to make a decision.   ED is more of a px. Reduced voice family. Plan : Spravato  06/25/23 appt noted: with W Med:  duloxetine  120, Concerta  36 mg AM, Lyrica  100 HS, no ropinirole .   Vraylar  1.5 daily No SE Received Spravato 56 mg today.  Experienced very mild dissociation and no sig HA, N.  But some dizziness.  Resolved by end of 2 hours observation.  Able to leave office without assistance.  Ongoing dep without change.  Slow, reduced cognition, anhedonia, forgetful, low motivation. No other concerns with meds.   06/27/23 appt noted:  Med: Med:  duloxetine  120, Concerta  36 mg AM, Lyrica  100 HS, no ropinirole .   Stopped Vraylar  1.5 daily No SE Received Spravato 84 mg todayfor the first time..  Experienced very mild dissociation and no sig HA, N.  But some dizziness.  Resolved by end of 2 hours observation.  Able to leave office without assistance.  Ongoing dep without change.  Slow, reduced cognition, anhedonia, forgetful, low motivation. No other concerns with meds.   07/02/23 appt oted: Med: Med:  duloxetine  90, Concerta  36 mg AM, Lyrica  100 HS, no ropinirole .   Stopped Vraylar  1.5 daily No SE Received Spravato 84 mg today.  Experienced very mild dissociation and no sig HA, N.  But some dizziness.  Resolved by end of 2 hours observation.  Mildly drowsy at end of session. BC of gait px he needed to use WC to leave the office.  Per wife gait gradually worsened over a couple of years but accelerated recently in 3 mos or so.  Short gait.  Not due to pain.  Pending neuro appt.  MRI last week  not read yet. No problems with meds. Wife noticed he's shown more initiative at home since [place] ex doing crossword puzzles.  More engaged.  He feels lighter already with it.  07/15/23 appt noted:  Med: Med:  duloxetine  90, Concerta  36 mg AM, Lyrica  100 HS, no ropinirole .  No SE Received Spravato 84 mg today.  Experienced very mild dissociation and no sig HA, N.  But some dizziness.   Resolved by end of 2 hours observation.  Mildly drowsy at end of session. BC of gait px he needed to use WC to leave the office. Seen with W.   W noted clear benefit Spravato with stronger voice, spontaneous chores he wasn't doing.  More engaged in coverstation.  Pt agrees. Disc concerns over gait px and his neuro visit and MRI scan suggesting Fahr's DZ.  He'll discuss further with DR. Tat tomorrow.  07/18/23 appt noted:  Med: Med:  duloxetine  90, Concerta  36 mg AM, Lyrica  100 HS, no ropinirole .  No SE Received Spravato 84 mg today.  Experienced very mild dissociation and no sig HA, N.  But some dizziness.  Resolved by end of 2 hours observation.  Mildly drowsy at end of session. BC of gait px he needed to use WC to leave the office. No med concerns.  Seen with W.    Both agree dep is better .  More affective range.  More socially engaged.  Quicker thought.  More active .  Less down and less negative.  07/23/23 appt noted: Med: Med:  duloxetine  90, Concerta  36 mg AM, Lyrica  100 HS, no ropinirole .  No SE Received Spravato 84 mg today.  Experienced very mild dissociation and no sig HA, N.  But some dizziness.  Resolved by end of 2 hours observation.  Mildly drowsy at end of session. BC of gait px he needed to use WC to leave the office. No med concerns.  Seen with W.    Dep is still improved.  But is dealing with poor gait, weak and slow.  Will start neurorehab today.   Anxiety re: OCD manageable. Thought and affect quicker and more responsive .  Humor returned.  07/25/23 appt:  Med: Med:  duloxetine  90, Concerta  36 mg AM, Lyrica  100 HS, no ropinirole .  No SE Received Spravato 84 mg today.  Experienced very mild dissociation and no sig HA, N.  But some dizziness.  Resolved by end of 2 hours observation.  Mildly drowsy at end of session. BC of gait px he needed to use WC to leave the office. No med concerns.  Seen with W.    Overall mood still improving but not 100%.  Starting neurorehab for  weakness.  No new med concerns.  Satisfied with meds.  07/30/23 appt noted:  Med: Med:  duloxetine  90, Concerta  36 mg AM, Lyrica  100 HS, no ropinirole .  No SE Received Spravato 84 mg today.  Experienced very mild dissociation and no sig HA, N.  But some dizziness.  Resolved by end of 2 hours observation.  Mildly drowsy at end of session. BC of gait px he needed to use WC to leave the office. No med concerns.  Seen with W.    Both agree mood is improving and activity and interest.  Not 100% but limited by mobility and starting PT.    08/15/23 appt noted:  Med:  duloxetine  90, Concerta  36 mg AM, Lyrica  100 HS, no ropinirole .  No SE Received Spravato 56 mg today.  Experienced very mild  dissociation and no sig HA, N.  But some dizziness.  Resolved by end of 2 hours observation.  Mildly drowsy at end of session.  But less than when on 84 mg. BC of gait px he needed to use WC to leave the office. No med concerns.  Seen with W.    Less drowsy at  end of time here than when got 84 mg daily. They both agree good response with dep and anxiety.  More involved and better interaction socially.  More initiative.  08/20/23 appt noted:  Med:  duloxetine  90, Concerta  36 mg AM, Lyrica  100 HS, no ropinirole .  No SE Received Spravato 56 mg today.  Experienced very mild dissociation and no sig HA, N.  But some dizziness.  Resolved by end of 2 hours observation.  Mildly drowsy at end of session.  But less than when on 84 mg. BC of gait px he needed to use WC to leave the office. No med concerns.  Seen with W.   She's still concerned he sleeps too much.  He's not sure why he does so.  Overall mood, initiative, socialization is better. Less drowsy at  end of time here than when got 84 mg daily.  08/27/23 appt noted:  Med:  duloxetine  90, Concerta  36 mg AM, Lyrica  100 HS, no ropinirole .  No SE Received Spravato 84 mg today.  Experienced very mild dissociation and no sig HA, N.  But some dizziness.  Resolved by end  of 2 hours observation.  Mildly drowsy at end of session.  But less than when on 84 mg. Walking is some better with PT.  Wife agrees.  He's more interested, responsive, engaged.  Better cognition. After Spravato 56 he does not have to nap when goes home. After 84 mg needed to nap but he wants to increase back to 84 mg to get max effect. More sedate after 84 mg today but is able to clear by end of session.  But wants to continue it.  Over all doing pretty well  with less dep and more engagement and activity including spontaneously cleaning things.   ECT-MADRS    Flowsheet Row Office Visit from 06/18/2023 in [facility] Office Visit from 04/16/2023 in [facility]  MADRS Total Score 37 36   [id number]    Flowsheet Row Office Visit from 03/15/2020 in [facility] Total Score 3  PHQ-9 Total Score 9     B schizophrenic SUI. After M's death. PCP Cecil Ee at [facility] at 73 years old.  Prior psychiatric medication trials include  Lexapro  20, citalopram NR, clomipramine weight gain, paroxetine, fluoxetine, Luvox, Trintellix,   Increase Lexapro  back to 20 mg January 2020. & 10/2020 bupropion ,  Auvelity NR  Abilify  10 fatigue,  Cerefolin NAC, and   Naltrexone  sexual SE Lyrica  150 tired  pramipexole,  ropinirole  Adderall XR & IR sexual SE,  Ritalin  30, Concerta  72 mg AM NR modafinil  and Nuvigil,   History [employer] OCD  Review of Systems:  Review of Systems  Constitutional:  Positive for fatigue. Negative for fever.  Cardiovascular:  Negative for chest pain and palpitations.  Genitourinary:        ED  Musculoskeletal:  Positive for arthralgias, gait problem and myalgias.  Neurological:  Positive for weakness. Negative for syncope.  Psychiatric/Behavioral:  Positive for dysphoric mood and sleep disturbance. Negative for agitation, behavioral problems,  confusion, decreased  concentration, hallucinations, self-injury and suicidal ideas. The patient is nervous/anxious. The patient is not hyperactive.     Medications: I have reviewed the patient's current medications.  Current Outpatient Medications  Medication Sig Dispense Refill  . ALPRAZolam  (XANAX ) 0.25 MG tablet TAKE 1 TABLET (0.25 MG TOTAL) BY MOUTH 2 (TWO) TIMES DAILY AS NEEDED FOR ANXIETY OR SLEEP. 30 tablet 1  . aspirin 81 MG tablet Take 81 mg by mouth daily.    . Cholecalciferol (VITAMIN D-3) 5000 units TABS Take 5,000 Units by mouth daily.     . DULoxetine  (CYMBALTA ) 30 MG capsule Take 3 capsules (90 mg total) by mouth daily. 270 capsule 0  . methylphenidate  (RITALIN ) 10 MG tablet Take 3 tablets (30 mg total) by mouth daily as needed. 60 tablet 0  . methylphenidate  36 MG PO CR tablet Take 1 tablet (36 mg total) by mouth daily. 30 tablet 0  . naltrexone  (DEPADE) 50 MG tablet Take 0.5 tablets (25 mg total) by mouth daily. 45 tablet 1  . pregabalin  (LYRICA ) 50 MG capsule Take 1 capsule (50 mg total) by mouth at bedtime. 90 capsule 0  . simvastatin (ZOCOR) 20 MG tablet Take 20 mg by mouth at bedtime.    . ZEPBOUND  10 MG/0.5ML Pen Inject 10 mg into the skin once a week.     No current facility-administered medications for this visit.    Medication Side Effects: None sexual SE are better not  All gone.  Allergies:  Allergies  Allergen Reactions  . E.E.S. [Erythromycin] Hives  . Macrolides And Ketolides Other (See Comments)    EES   . Rosuvastatin     Other reaction(s): cramps    Past Medical History:  Diagnosis Date  . Allergy   . Anemia    iron- pt denies   . Anxiety   . Aortic cusp regurgitation   . Carotid artery occlusion   . Constipation   . Coronary artery stenosis   . Hyperlipidemia   . Lactose intolerance   . Major depression, recurrent, chronic (HCC)   . Obesity   . OCD (obsessive compulsive disorder)   . OSA (obstructive sleep apnea)   . Other chronic  pain   . Periodic limb movement disorder   . Periodic limb movements of sleep   . Prediabetes   . Pure hypercholesterolemia   . Restless legs   . Sleep apnea    wears cpap   . Vitamin D deficiency     Family History  Problem Relation Age of Onset  . Cancer Mother        breast and ovarian  . Anxiety disorder Mother   . Breast cancer Mother   . Ovarian cancer Mother   . Depression Father        bi-polar  . Hyperlipidemia Father   . Heart disease Father   . Sudden death Father   . Bipolar disorder Father   . Sleep apnea Father   . Obesity Father   . Depression Son   . Colon cancer Neg Hx   . Colon polyps Neg Hx   . Esophageal cancer Neg Hx   . Rectal cancer Neg Hx   . Stomach cancer Neg Hx     Social History   Socioeconomic History  . Marital status: Married    Spouse name: Not on file  . Number of children: Not on file  . Years of education: Not on file  . Highest education level: Not on file  Occupational History  .  Occupation: retired Pensions consultant  Tobacco Use  . Smoking status: Never  . Smokeless tobacco: Never  Substance and Sexual Activity  . Alcohol use: Yes    Comment: occasionally   . Drug use: No  . Sexual activity: Not on file  Other Topics Concern  . Not on file  Social History Narrative  . Not on file   Social Drivers of Health   Financial Resource Strain: Not on file  Food Insecurity: No Food Insecurity (01/25/2022)   Received from [facility] visits prior to 03/24/2022., [facility]   Hunger Vital Sign   . Within the past 12 months, you worried that your food would run out before you got the money to buy more.: Never true   . Within the past 12 months, the food you bought just didn't last and you didn't have money to get more.: Never true  Transportation Needs: No Transportation Needs (01/25/2022)   Received from [facility] visits prior to 03/24/2022.   PRAPARE - Transportation   . Lack  of Transportation (Medical): No   . Lack of Transportation (Non-Medical): No  Physical Activity: Not on file  Stress: Not on file  Social Connections: Not on file  Intimate Partner Violence: Not on file    Past Medical History, Surgical history, Social history, and Family history were reviewed and updated as appropriate.   Please see review of systems for further details on the patient's review from today.   Objective:   Physical Exam:  There were no vitals taken for this visit.  Physical Exam Constitutional:      General: He is not in acute distress.    Appearance: He is obese.  Musculoskeletal:        General: No deformity.  Neurological:     Mental Status: He is alert and oriented to person, place, and time.     Cranial Nerves: No dysarthria.     Motor: Weakness present.     Coordination: Coordination abnormal.     Comments: Shortened gait is better.  No sig tremor.  less Slow but still not normal.  Psychiatric:        Attention and Perception: Attention and perception normal. He does not perceive auditory or visual hallucinations.        Mood and Affect: Mood is anxious and depressed. Affect is not labile, blunt, angry, tearful or inappropriate.        Speech: Speech normal.        Behavior: Behavior is not slowed. Behavior is cooperative.        Thought Content: Thought content normal. Thought content is not paranoid or delusional. Thought content does not include homicidal or suicidal ideation. Thought content does not include suicidal plan.        Cognition and Memory: Cognition and memory normal.        Judgment: Judgment normal.     Comments: Insight intact Depression 60% better OCD is about the same and not the main problem More expressive and more normal affect.  Quicker responses.    November 06, 2018: [place] Cog test in office within normal limits MMSE 28/30. Animal fluency 17 . (borderline) Taken as a whole, no indication to pursue neuropsychological  testing.  Mini-Mental status exam 28/30 on 10/27/20.  No evidence of dementia.  Lab Review:   Vitamin D level acceptable at 54.5.   Normal B12 and folate and TSH in last couple of years..  Echocardiogram is  stable re: AVR over the last 8 years and not likely the cause of lethargy.   06/28/23 MRI head:  IMPRESSION: 1. No acute intracranial abnormality. 2. 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 Head CT may be helpful for further assessment of the degree of mineralization. 3. Findings of chronic small vessel ischemia.  SABRAres Assessment: Plan:    There are no diagnoses linked to this encounter.     Mr. Mazzola has a long history of depression and OCD.  Major depression with fatigue, cognitive and physical slowness, anhedonia is much worse.  Started Spravato  and improving.  Was better energy with duloxetine  90 vs Lexapro  so increased to 120 mg daily DT worsening depression.    But it was not better. So reduced it back to 90 mg daily. Dep has been much worse, in fact, his worst depression ever in 2025. Spravato  He has some compulsive checking and obsessions around the house maintenance.  When travels then tends to have less OCD bc triggered less.    Reduced Spravato to 56 mg bc seemed quite sedated recently with 84 mg .  Not as much at end of session today. The patient experienced the typical dissociation which gradually resolved over the 2-hour period of observation.  There were no complications.  Specifically the patient did not have nausea or vomiting or headache.  Blood pressures remained within normal ranges at the 40-minute and 2-hour follow-up intervals.  By the time the 2-hour observation period was met the patient was alert and oriented and able to exit without assistance.  Patient feels the Spravato administration is helpful for the treatment resistant  depression and would like to continue the treatment.  See nursing note for further details. Emphasized need to relax with Spravato and not return calls, read, return chats, emails etc. Started Spravato 06/25/23.  Will try dropping back to weekly but increase back to 84 mg with next dose.  His OCD is persistent with checking lites, stove, etc. .  It is better at the moment DT depression being worse.  Disc CBT techniques and potential for more therapy to address. Option Dr. Marijean.  Continue  retrial Concerta  36 mg AM He wants to use it for depression and cognitive concerns.  Discussed potential benefits, risks, and side effects of stimulants with patient to include increased heart rate, palpitations, insomnia, increased anxiety, increased irritability, or decreased appetite.  Instructed patient to contact office if experiencing any significant tolerability issues.  Extensive discussion of sleep study and he has a copy.  Why is there virtually no N3 & REM sleep?  Is that affecting daytime alertness and fatigue?  Vs how much is related to mild OSA and PLMS?  Disc this in detail.  Wrote correspondence with sleep doc about it.  Options for sleep & fatigue:  Difficult to assess  bc has been out of country and then sick for 3 weeks.   Focus on reduction in OSA Focus on improving deep stage sleep.  ? Rx low dose mirtazapine or alternatives Focus on leg movements (hx RLS/PLMS) continue Trial as had been suggested Lyrica  for FM type sx and may help RLS/PLMS.  Better dreaming on 100 mg HS and too sleepy on 150.  Decreased  to 100 mg HS for sleep and back pain and RLS No ropinirole  needed.   Disc SE.  Not having any.   Use LED Xanax  and try to avoid BZ  daytime bc fatigue.  He doesn't use it much daythime.  Using 0..25-0.5 mg at night.  May need less with more Lyrica  at HS and try to minimize.  No hangover. We discussed the short-term risks associated with benzodiazepines including sedation and increased  fall risk among others.  Discussed long-term side effect risk including dependence, potential withdrawal symptoms, and the potential eventual dose-related risk of dementia.  But recent studies from 2020 dispute this association between benzodiazepines and dementia risk. Newer studies in 2020 do not support an association with dementia.  Stimulant partially successfully used off label to augment antidepressants for depression and have resulted in improved productivity and attention.    Previous screening of memory was not suggestive of any neuro degenerative process: Mini-Mental status exam 28/30 on 10/27/20.   Recent cognition worse as dep worsened.  Also gait is short and slow.   neuro appt.  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  Plan:  no med changes.   continue duloxetine  to 120 mg daily.  Off Lexapro .  Lyrica  100 mg HS.  resumed MPH ER 36 mg AM   Spravato disc in detail.  He wants to continue.  He and wife notice he's better.  Administer now drop back to weekly  He's still improving.  Will  increase back to 84 mg .  He is tolerating it better  Follow-up   Lorene Macintosh MD, DFAPA.  Please see After Visit Summary for patient specific instructions.  Future Appointments  Date Time Provider Department Center  09/17/2023  9:00 AM Cottle, Lorene KANDICE Raddle., MD [facility] None  09/17/2023  9:00 AM CP-NURSE [facility] None  09/17/2023  1:00 PM Cottle, Lorene KANDICE Raddle., MD [facility] None  09/18/2023  3:30 PM Annett Sheffield SAILOR, PT [facility]  09/25/2023 12:30 PM Plaster, Marlon BRAVO, PT [facility]  10/02/2023 12:30 PM Plaster, Marlon BRAVO, PT [facility]  10/15/2023  1:30 PM Cottle, Lorene KANDICE Raddle., MD [facility] None  01/30/2024  3:00 PM Tat, Asberry RAMAN, DO [facility] None     No orders of the defined types were placed in this encounter.      -------------------------------

## 2023-09-13 ENCOUNTER — Encounter: Payer: Self-pay | Admitting: Psychiatry

## 2023-09-16 ENCOUNTER — Ambulatory Visit: Admitting: Psychiatry

## 2023-09-17 ENCOUNTER — Ambulatory Visit

## 2023-09-17 ENCOUNTER — Ambulatory Visit: Admitting: Psychiatry

## 2023-09-17 ENCOUNTER — Ambulatory Visit (INDEPENDENT_AMBULATORY_CARE_PROVIDER_SITE_OTHER): Admitting: Behavioral Health

## 2023-09-17 VITALS — BP 136/73 | HR 68

## 2023-09-17 DIAGNOSIS — F339 Major depressive disorder, recurrent, unspecified: Secondary | ICD-10-CM

## 2023-09-17 NOTE — Progress Notes (Addendum)
 Eric [name] [id number] Mar 27, 1950 73 y.o.  Subjective:   Patient ID:  Eric [name] is a 73 y.o. (DOB [birth year]) male.  Chief Complaint: No chief complaint on file.   HPI Eric Nishikawa. [name] presents to the office today for follow-up of  treatment resistant depression (TRD).   Spravato treatment    Patient was administered Spravato 56 mg intranasally today. Patient was observed by provider throughout Spravato treatment. The patient experienced the typical dissociation which gradually resolved over the 2-hour period of observation. There were no complications. Specifically, the patient did not have any untoward side effects - feeling disconnected from themself, their thoughts, feelings and things around them, dizziness, nausea, feeling sleepy, decreased feeling of sensitivity (numbness) spinning sensation, feeling anxious, lack of energy, increased blood pressure, feeling happy or very excited, or headache. Blood pressures remained within normal ranges at the 40-minute and 2-hour follow-up intervals. By the time the 2-hour observation period was met the patient was alert and oriented and able to exit without assistance. Patient willing to continue Spravato administration for the treatment of resistant depression.    Reports Depression 3/10 today. Feels like depression is starting to improve. Pt is still cognitively impaired and drowsy at the 2 hour mark after reducing dose of Sprivato. Say, It hit me hard today.  Wife is her to provide transportation.  No question or concerns       ECT-MADRS    Flowsheet Row Office Visit from 06/18/2023 in [facility] Office Visit from 04/16/2023 in [facility]  MADRS Total Score 37 36   PHQ2-9    Flowsheet Row Office Visit from 03/15/2020 in [facility] Total Score 3  PHQ-9 Total Score 9     Review of Systems:  Review of  Systems  Constitutional: Negative.   Neurological: Negative.   Psychiatric/Behavioral:  Positive for dysphoric mood.     Medications: I have reviewed the patient's current medications.  Current Outpatient Medications  Medication Sig Dispense Refill   ALPRAZolam  (XANAX ) 0.25 MG tablet TAKE 1 TABLET (0.25 MG TOTAL) BY MOUTH 2 (TWO) TIMES DAILY AS NEEDED FOR ANXIETY OR SLEEP. 30 tablet 1   aspirin 81 MG tablet Take 81 mg by mouth daily.     Cholecalciferol (VITAMIN D-3) 5000 units TABS Take 5,000 Units by mouth daily.      DULoxetine  (CYMBALTA ) 30 MG capsule Take 3 capsules (90 mg total) by mouth daily. 270 capsule 0   methylphenidate  (RITALIN ) 10 MG tablet Take 3 tablets (30 mg total) by mouth daily as needed. 60 tablet 0   methylphenidate  36 MG PO CR tablet Take 1 tablet (36 mg total) by mouth daily. 30 tablet 0   naltrexone  (DEPADE) 50 MG tablet Take 0.5 tablets (25 mg total) by mouth daily. 45 tablet 1   pregabalin  (LYRICA ) 50 MG capsule Take 1 capsule (50 mg total) by mouth at bedtime. 90 capsule 0   simvastatin (ZOCOR) 20 MG tablet Take 20 mg by mouth at bedtime.     ZEPBOUND  10 MG/0.5ML Pen Inject 10 mg into the skin once a week.     No current facility-administered medications for this visit.    Medication Side Effects: None  Allergies:  Allergies  Allergen Reactions   E.E.S. [Erythromycin] Hives   Macrolides And Ketolides Other (See Comments)    EES    Rosuvastatin     Other reaction(s): cramps    Past Medical  History:  Diagnosis Date   Allergy    Anemia    iron- pt denies    Anxiety    Aortic cusp regurgitation    Carotid artery occlusion    Constipation    Coronary artery stenosis    Hyperlipidemia    Lactose intolerance    Major depression, recurrent, chronic (HCC)    Obesity    OCD (obsessive compulsive disorder)    OSA (obstructive sleep apnea)    Other chronic pain    Periodic limb movement disorder    Periodic limb movements of sleep    Prediabetes     Pure hypercholesterolemia    Restless legs    Sleep apnea    wears cpap    Vitamin D deficiency     Past Medical History, Surgical history, Social history, and Family history were reviewed and updated as appropriate.   Please see review of systems for further details on the patient's review from today.   Objective:   Physical Exam:  There were no vitals taken for this visit.  Physical Exam  Lab Review:  No results found for: NA, K, CL, CO2, GLUCOSE, BUN, CREATININE, CALCIUM, PROT, ALBUMIN, AST, ALT, ALKPHOS, BILITOT, GFRNONAA, GFRAA  No results found for: WBC, RBC, HGB, HCT, PLT, MCV, MCH, MCHC, RDW, LYMPHSABS, MONOABS, EOSABS, BASOSABS  No results found for: POCLITH, LITHIUM    No results found for: PHENYTOIN, PHENOBARB, VALPROATE, CBMZ   .res Assessment: Plan:    Recommendations:   Pt is very lethargic at 2 hour mark post session. Needs assistance walking.    Continue Spravato treatment. Pt did not have any questions or concerns today.  Will continue to f/u with Eric [name] for medication management.      Eric A. Keimani Laufer, NP      Diagnoses and all orders for this visit:  Recurrent major depression resistant to treatment [facility])     Please see After Visit Summary for patient specific instructions.  Future Appointments  Date Time Provider Department Center  09/18/2023  3:30 PM Eric [name], [place] [facility]  09/24/2023  9:00 AM [name], Eric KANDICE Raddle., [name] [facility] None  09/24/2023  9:00 AM CP-NURSE CP-CP None  09/25/2023 12:30 PM [name], Eric BRAVO, PT [facility]  10/02/2023 12:30 PM [name], Eric BRAVO, PT [facility]  10/15/2023  1:30 PM [name], Eric KANDICE Raddle., [name] CP-CP None  01/30/2024  3:00 PM [name], Eric RAMAN, DO [facility] None    No orders of the defined types were placed in this encounter.   -------------------------------

## 2023-09-17 NOTE — Progress Notes (Signed)
 NURSES NOTE:         Pt arrived for his #20 Spravato Treatment for treatment resistant depression, the starting dose was 56 mg (2 of the 28 mg nasal sprays). Pt is getting 84 mg again today after receiving 3 of the 56 mg the past 3 weeks due to sedation.  Eric [name] is a patient of Dr. Calhoun so he will follow his care throughout treatments and follow ups. Pt's Spravato is a medical authorization through buy and bill.  Spravato medication is stored at treatment center per REMS/FDA guidelines. The medication is required to be locked behind two doors per REMS/FDA protocol. Medication is also disposed of properly after each use per regulations. All documentation for REMS is completed and submitted per FDA/REMS requirements.          Began taking patient's vital signs at 9:10 AM 135/71, pulse 62, SpO2 96%. Instructed patient to blow his nose if needed then recline back to a 45 degree angle. Gave patient first dose 28 mg nasal spray, administered in each nostril as directed and observed by nurse, waited 5 more minutes for the second and third dose.  After all doses given pt did not complain of any nausea/vomiting. Pt was given water, snacks, and candy.  Assessed his 40 minute vitals, 9:48 AM, 138/70, pulse 61, SpO2 94%. Explained he would be monitored for a total time of 120 minutes. Discharge vitals were taken at 11:03 AM 136/73, P 68, SpO2 94%. Eric Pizza, NP came to visit with patient once his thoughts were clearer to discuss how treatment went. Recommend he go home and sleep or just relax on the couch. No driving, no intense activities. Verbalized understanding. Nurse was with pt a total of 60 minutes for clinical assessment. Pt was able to use his rolling walker on the way down to the vehicle. Instructed to call with any issues. Pt will return weekly on Tuesdays.    LOT [id number] EXP FEB 2028

## 2023-09-18 ENCOUNTER — Encounter: Payer: Self-pay | Admitting: Physical Therapy

## 2023-09-18 ENCOUNTER — Ambulatory Visit: Admitting: Physical Therapy

## 2023-09-18 DIAGNOSIS — R2689 Other abnormalities of gait and mobility: Secondary | ICD-10-CM

## 2023-09-18 DIAGNOSIS — M6281 Muscle weakness (generalized): Secondary | ICD-10-CM | POA: Diagnosis not present

## 2023-09-18 DIAGNOSIS — R531 Weakness: Secondary | ICD-10-CM | POA: Diagnosis not present

## 2023-09-18 DIAGNOSIS — R293 Abnormal posture: Secondary | ICD-10-CM | POA: Diagnosis not present

## 2023-09-18 DIAGNOSIS — Z6835 Body mass index (BMI) 35.0-35.9, adult: Secondary | ICD-10-CM | POA: Diagnosis not present

## 2023-09-18 DIAGNOSIS — E7800 Pure hypercholesterolemia, unspecified: Secondary | ICD-10-CM | POA: Diagnosis not present

## 2023-09-18 DIAGNOSIS — G4710 Hypersomnia, unspecified: Secondary | ICD-10-CM | POA: Diagnosis not present

## 2023-09-18 DIAGNOSIS — R2681 Unsteadiness on feet: Secondary | ICD-10-CM

## 2023-09-18 DIAGNOSIS — G4733 Obstructive sleep apnea (adult) (pediatric): Secondary | ICD-10-CM | POA: Diagnosis not present

## 2023-09-18 NOTE — Therapy (Signed)
 OUTPATIENT PHYSICAL THERAPY NEURO TREATMENT   Patient Name: Eric [name] MRN: [id number] DOB:13-Dec-1950, 73 y.o., male Today's Date: 09/19/2023   PCP: Regino Slater, MD   REFERRING PROVIDER: Evonnie Asberry RAMAN, DO   END OF SESSION:  PT End of Session - 09/18/23 1536     Visit Number 15    Number of Visits 17    Date for PT Re-Evaluation 10/06/23   per re-cert on 1/84/74   Authorization Type [employer]    Progress Note Due on Visit 9   performed on visit 9   PT Start Time 1534   pt late to session   PT Stop Time 1614    PT Time Calculation (min) 40 min    Equipment Utilized During Treatment Gait belt    Activity Tolerance Patient tolerated treatment well    Behavior During Therapy WFL for tasks assessed/performed;Flat affect            Past Medical History:  Diagnosis Date   Allergy    Anemia    iron- pt denies    Anxiety    Aortic cusp regurgitation    Carotid artery occlusion    Constipation    Coronary artery stenosis    Hyperlipidemia    Lactose intolerance    Major depression, recurrent, chronic (HCC)    Obesity    OCD (obsessive compulsive disorder)    OSA (obstructive sleep apnea)    Other chronic pain    Periodic limb movement disorder    Periodic limb movements of sleep    Prediabetes    Pure hypercholesterolemia    Restless legs    Sleep apnea    wears cpap    Vitamin D deficiency    Past Surgical History:  Procedure Laterality Date   COLONOSCOPY     DG BARIUM ENEMA ([facility] HX)     10-19-2010- turtous, elongated colon    HEMORRHOID SURGERY     TONSILLECTOMY  1960   Patient Active Problem List   Diagnosis Date Noted   Vitamin D deficiency 03/30/2020   Pure hypercholesterolemia 03/30/2020   Prediabetes 03/30/2020   Occlusion and stenosis of bilateral carotid arteries 03/30/2020   Obstructive sleep apnea syndrome 03/30/2020   Aortic cusp regurgitation 03/30/2020   Restless legs syndrome 03/30/2020   Moderate  recurrent major depression (HCC) 03/30/2020   Chronic pain 03/30/2020   OCD (obsessive compulsive disorder) 10/15/2017   Family history of ovarian cancer 04/19/2011   Family history of breast cancer 04/19/2011    ONSET DATE: 07/12/2023   REFERRING DIAG: G20.C (ICD-10-CM) - Primary parkinsonism (HCC)   THERAPY DIAG:  Unsteadiness on feet  Other abnormalities of gait and mobility  Muscle weakness (generalized)  Abnormal posture  Rationale for Evaluation and Treatment: Rehabilitation  SUBJECTIVE:  SUBJECTIVE STATEMENT: No falls. Has done the exercises once or twice since he was last here. His personal trainer has been working on with some step ups and general work out things - some dumbbells and sit ups and push ups. Hasn't signed up for the exercise class yet. Wants to keep working on his stride.   Pt accompanied by: Self    PERTINENT HISTORY: PMH: Parkinsonism (Possibly due to exposure to antipsychotic medication), tardive dyskinesias, restless leg syndrome, CAD, pre-diabetes, obesity, OCD, major depression,HLD  He was not exposed to anything for very long, but wife reports that symptoms have really been developing over the last 5 to 6 weeks. He was on Abilify  for a very short time in February and March and then was on Vraylar  from March until June. The symptoms developed around that time. He is off of the Vraylar  now (just went off of it)   Per Dr. Evonnie regarding abnormal brain scan:  It does appear that there is heavy basal ganglia calcification.  We can certainly confirm this with CT.  While Fahrs disease is on the differential, the timeline for his symptoms really correlates more with the timeline for when he was placed on antipsychotic medication.   PAIN:  Are you having pain?  No  PRECAUTIONS: Fall  FALLS: Has patient fallen in last 6 months? No and almost falls grabbing the wall   LIVING ENVIRONMENT: Lives with: lives with their spouse Lives in: House/apartment Stairs: Yes: Internal: 12 steps; on left going up and External: 4 and 5 steps; none Has following equipment at home: Single point cane and Grab bars  PLOF: Independent and Leisure: doesn't have any hobbies except cleaning up the house  PATIENT GOALS: Wants to get back to his regular gait and his regular walk so he can go on trips   OBJECTIVE:  Note: Objective measures were completed at Evaluation unless otherwise noted.  DIAGNOSTIC FINDINGS: MRI brain: IMPRESSION: 1. No acute intracranial abnormality. 2. 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 Head CT may be helpful for further assessment of the degree of mineralization. 3. Findings of chronic small vessel ischemia.  COGNITION: Overall cognitive status: Within functional limits for tasks assessed   SENSATION: WFL  COORDINATION: Heel to shin: some bradykinesia RLE RAM: dysmetric BUE     POSTURE: rounded shoulders, forward head, and posterior pelvic tilt   LOWER EXTREMITY MMT:    MMT Right Eval Left Eval  Hip flexion 3- 4  Hip extension    Hip abduction    Hip adduction    Hip internal rotation    Hip external rotation    Knee flexion 4 5  Knee extension 4 4  Ankle dorsiflexion 5 5  Ankle plantarflexion    Ankle inversion    Ankle eversion    (Blank rows = not tested)  BED MOBILITY:   Pt reports some difficulties getting under the sheets    TRANSFERS: Sit to stand: Modified independence  Assistive device utilized: None     Stand to sit: Modified independence  Assistive device utilized: None     Pt reports difficulties getting up from lower surfaces or in and out of the car.   GAIT: Findings:  Gait Characteristics: decreased arm swing- Right, decreased arm swing- Left, decreased step length- Right, decreased step length- Left, decreased stride length, decreased hip/knee flexion- Right, decreased hip/knee flexion- Left, decreased ankle dorsiflexion- Right, decreased ankle dorsiflexion- Left, Right  foot flat, Left foot flat, shuffling, decreased trunk rotation, trunk flexed, and narrow BOS, Distance walked: Clinic distances, Assistive device utilized:Single point cane and None, Level of assistance: CGA, and Comments: Pt with very shuffled steps. When pt gets single HHA from therapist for a couple steps, pt able to demo improved step length   FUNCTIONAL TESTS:  5 times sit to stand: 15.6 seconds with no UE support, incr forward flexed posture in standing  Timed up and go (TUG): 27.4 seconds with no AD, CGA with en bloc turning 10 meter walk test: 62 seconds with no AD and CGA = .52 ft/sec   VITALS  There were no vitals filed for this visit.                                                                                                                               TREATMENT DATE:   Therapeutic Exercise:  NuStep with BLE/BUE at gear 5.0 > 6.0 for 8 minutes for neural priming, reciprocal movement patterns, activity tolerance, and incr amplitude of stepping. Cues to try to keep spm above 60. Pt reporting RPE as 6-7/10  NMR:  Pt ambulating with rollator, needing cues for incr stride length/heel strike as pt ambulating with very shuffled steps.   Pt performs PWR! Moves in seated position x 20 reps (to perform as pt is interested in the PWR moves class and for pt to have exposure before going to the class)   PWR! Up for improved posture  PWR! Rock for improved weight shifting, pt reporting feeling a good stretch in his back   PWR! Twist for improved trunk rotation, cues to reset in the middle with tall posture   PWR! Step for improved step initiation, performed 12 reps, pt with  significant difficulty picking up feet from the floor and just tends to drag them, esp with RLE   Cues provided for technique and larger amplitude movements, esp when stepping. Pt interested in working on these at home, so provided to HEP. Pt reporting RPE as an 8-9/10  With rebounder:  Alternating forward stepping over 2 obstacle for improved foot clearance/stepping intensity and tossing red ball and catching it 10 reps each side, progressing to 4 obstacle 15 reps each side, CGA for balance, pt intermittently knocking over obstacle due to decr step height and PT needing to help re-set it back up, cues that when stepping forward to fully weight shift forwards   On blue foam beam: Alternating SLS taps to 2nd step 15 reps each with addition of cognitive challenge of naming animals from A-Z, cues to lift up leg to place it fully on the step, when pt stepping off of balance beam, needing an episode of min A from therapist for balance    PATIENT EDUCATION: Education details: Continue HEP and addition of seated PWR moves as pt felt good with these and wanted to keep working on them  Person educated: Patient Education method: Programmer, multimedia,  Demonstration, Verbal cues, and Handouts Education comprehension: verbalized understanding, returned demonstration, verbal cues required, and needs further education  HOME EXERCISE PROGRAM: Standing PWR Moves, Seated PWR Moves  Access Code: [email or web address] URL: [email or web address] Date: 08/19/2023 Prepared by: Sheffield Senate  Exercises - Standing Marching  - 1-2 x daily - 5 x weekly - 4 sets  GOALS: Goals reviewed with patient? Yes  SHORT TERM GOALS: Target date: 08/13/2023   1. Pt will be independent with initial HEP with gait, balance, strength in order to build upon functional gains made in therapy. Baseline: pt reports doing standing PWR moves every other day Goal status: PARTIALLY MET  2.  Pt will improve TUG time to 22 seconds or less  with no AD vs. LRAD in order to demo decrease fall risk. Baseline: 27.4 seconds with no AD, CGA  16.6 seconds with no AD SBA/CGA (7/23) Goal status: MET  3.  Pt will improve gait speed with LRAD to at least 1.3 ft/sec in order to demo decr fall risk/improved household mobility.  Baseline: 62 seconds with no AD = .52 ft/sec and CGA   21.7 seconds with rollator = 1.51 ft/sec Goal status: MET  4.  Pt will ambulate at least 230' with supervision with LRAD over level indoor surfaces in order to demo improved household mobility.  Baseline: pt ambulates with no AD with CGA   Ambulates 230' with supervision and rollator  Goal status: MET   LONG TERM GOALS: Target date: 09/03/2023   Pt will be independent with final HEP with gait, balance, strength in order to build upon functional gains made in therapy. Baseline: pt has not been performing  Goal status: NOT MET  2.  Pt will improve TUG time to 13.5 seconds or less with no AD vs. LRAD in order to demo decrease fall risk. Baseline: 27.4 seconds with no AD, CGA  16.6 seconds with no AD SBA/CGA (7/23)  14 seconds with no AD (8/15) Goal status: NOT MET  3.  Pt will improve 5x sit<>stand to less than or equal to 13 sec to demonstrate improved functional strength and transfer efficiency.  Baseline: 15.6 seconds with no UE support, incr forward flexed posture in standing  11.3 seconds with no UE support (7/30) Goal status: MET  4.  Pt will improve gait speed with LRAD to at least 1.8 ft/sec in order to demo decr fall risk  Baseline: 62 seconds with no AD = .52 ft/sec and CGA  Gait speed with rollator: 34.4 seconds = .95 ft/sec (7/30)  19.7 seconds with rollator = 1.66 ft/sec (8/15) Goal status: NOT MET  5.  Pt will ambulate at least 500' with supervision with LRAD over outdoor unlevel surfaces in order to demo improved community mobility.  Baseline: has been performing with rollator mod I  Goal status: MET   PER RE-CERT LONG  TERM GOALS: Target date: 10/04/2023   Pt will be independent with final HEP with gait, balance, strength in order to build upon functional gains made in therapy and verbalize going to a community fitness class.  Baseline: pt has not been performing  Goal status: ON-GOING  2.  Pt will improve TUG time to 13 seconds or less with no AD in order to demo decrease fall risk. Baseline:  14 seconds with no AD (8/15) Goal status: REVISED   4.  Pt will improve gait speed with LRAD to at least 1.8 ft/sec in order to demo decr fall risk  Baseline: 19.7 seconds with rollator =  1.66 ft/sec (8/15) Goal status: REVISED     ASSESSMENT:  CLINICAL IMPRESSION: Pt ambulating in with very short, shuffled steps into clinic with rollator and during session with continued cues needed from therapist for foot clearance and heel strike, with pt reverting back to more shuffled steps if cues not given. Performed seated PWR moves to prepare pt for PWR moves class and get exposure. Pt reports he is still interested for the class, but had not signed up yet. Pt challenged by seated PWR step and had significant difficulty with picking RLE off the ground, had a tendency to just drag it. Pt wanting to work on these for home, so provided as HEP. Remainder of session focused on foot clearance and stride length tasks. Will continue per POC as able.   OBJECTIVE IMPAIRMENTS: Abnormal gait, decreased activity tolerance, decreased balance, decreased coordination, decreased knowledge of condition, decreased knowledge of use of DME, decreased mobility, difficulty walking, decreased strength, decreased safety awareness, impaired flexibility, and postural dysfunction.   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   REHAB POTENTIAL: Good  CLINICAL DECISION MAKING: Evolving/moderate complexity  EVALUATION COMPLEXITY: Moderate  PLAN:  PT FREQUENCY: 2x/week  PT DURATION: 8 weeks plus 1x week for 4 weeks per re-cert on 1/84/74  PLANNED INTERVENTIONS: 97164- PT Re-evaluation, 97110-Therapeutic exercises, 97530- Therapeutic activity, 97112- Neuromuscular re-education, 97535- Self Care, [id number]- Manual therapy, [phone]- Gait training, Patient/Family education, Balance training, Stair training, and DME instructions  PLAN FOR NEXT SESSION:   Work on [employer] moves modified for [employer] (has pt signed up yet?) work on stride length, foot clearance   Will D/C at end of scheduled appts with PD screens in 6 months   Sheffield LOISE Senate, PT, DPT 09/19/2023, 1:08 PM

## 2023-09-24 ENCOUNTER — Ambulatory Visit

## 2023-09-24 ENCOUNTER — Ambulatory Visit (INDEPENDENT_AMBULATORY_CARE_PROVIDER_SITE_OTHER): Admitting: Psychiatry

## 2023-09-24 ENCOUNTER — Encounter: Payer: Self-pay | Admitting: Psychiatry

## 2023-09-24 VITALS — BP 131/78 | HR 70

## 2023-09-24 DIAGNOSIS — F339 Major depressive disorder, recurrent, unspecified: Secondary | ICD-10-CM

## 2023-09-24 DIAGNOSIS — R7989 Other specified abnormal findings of blood chemistry: Secondary | ICD-10-CM

## 2023-09-24 DIAGNOSIS — F900 Attention-deficit hyperactivity disorder, predominantly inattentive type: Secondary | ICD-10-CM

## 2023-09-24 DIAGNOSIS — G2581 Restless legs syndrome: Secondary | ICD-10-CM

## 2023-09-24 DIAGNOSIS — G3184 Mild cognitive impairment, so stated: Secondary | ICD-10-CM

## 2023-09-24 DIAGNOSIS — F5221 Male erectile disorder: Secondary | ICD-10-CM

## 2023-09-24 DIAGNOSIS — F422 Mixed obsessional thoughts and acts: Secondary | ICD-10-CM

## 2023-09-24 DIAGNOSIS — G8929 Other chronic pain: Secondary | ICD-10-CM

## 2023-09-24 DIAGNOSIS — G4733 Obstructive sleep apnea (adult) (pediatric): Secondary | ICD-10-CM

## 2023-09-24 DIAGNOSIS — G4730 Sleep apnea, unspecified: Secondary | ICD-10-CM

## 2023-09-24 NOTE — Progress Notes (Signed)
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  01/21/23 appt noted: Worrying too much bc OCD.  Trying to decide about how to deal with a trigger lately.  OCD driving me crazy wanting to make things perfect.   Other than the trigger had a nice time since here.  GS here for 5 days at 73 yo.  Enjoyed that.   Taking semaglutide   down to 250# from 283#. Card at [facility] says he does not have Aortic aneurysm.  Measuring is OK.  Will FU with MRI in June.   Some diarrhea lately. Cannot stop worrying.  Triggered by the plumbing issue. Meds as above.  No SE of sig.   Plan:  switched to duloxetine  to 90 mg daily.  Off Lexapro .  Others as noted. Lyrica  100 mg HS. Has held Ritalin  10 TID, can resume as long as SBP below 140 per card.  none needed ropinirole  3 mg pm.  No risperidone .  02/18/23 appt noted:  with [name] Meds: as above. Taking Ritalin  30% of night.  Prn Xanax  rarely if gets off sleep cycle. No SE.   Terribly dep but not as bad as in the past. Taking Ritalin  appears to help significantly and started doing that.  Tend to stay in bed till noon but pattern of up late.   OCD about the same with some avoidance of detail work.   Afraid I'll throw away something I need.   She doesn't know whether Ritalin  helps No sig RLS and wife agrees. Thinks of deceased B at the holidays but worries over son Deward too.   Plan: Increase duloxetine  to 120 mg daily.  Off Lexapro .  Others as noted. Lyrica  100 mg HS.  resumed Ritalin  30 AM with some benefit. no risperidone .  03/19/23 appt noted: Psych med:  duloxetine  120, Ritalin  20 am  missing doses, Lyrica  100 HS, no risperidone , no ropinirole .   No improvement in depression since increase dose duloxetine .  More dep than usual.  Worrying about everything.  Including taxes.  All I can see is a black hole.  Making him feel negative about everything.   Keeping him from doing things.   OCD is not much different from when on Lexapro .  Wife agrees dep and distracted. Plan: resumed Ritalin  30 AM with some benefit. Resume Abilify  2 mg AM for dep.  04/16/23 appt noted:  wife here Psych med:  duloxetine  120, resumed Concerta  36 mg AM, Lyrica  100 HS, Abilify  2, no ropinirole .   Wife noted he seemed manic yesterday.  Invited window estimate against wife's will and without her input.   No mania noted until yesterday. He didn't feel manic yesterday.   He's noticed no change with Abilify  and still feels flat and a little low.  From [facility]PH energy and mood a little better.  Sleeps until 10-11 am about 12 hours. And asleep that whole time. Wife says he doesn't eat much bc he's not awake much. Trouble with hygiene.   Plan: Meds: continue duloxetine  to 120 mg daily.  Off Lexapro .  Lyrica  100 mg HS.  resumed Ritalin  30 AM with some benefit. DC Abiilify Vraylar  1.5 mg every other day Spravato disc in detail.  He wants to pursue if not better with Vraylar .  06/18/23 appt noted: with W Med:  duloxetine  120, resumed Concerta  36 mg AM, Lyrica  100 HS, no ropinirole .   Vraylar  1.5 every other day, no benefit Spravato denied by insurance but is being appealed. More trouble walking and shorter gait.  Neuro  in [place] appt in Sept.   Looking to get in in [place].  Can't be much more dep than I am.   OCD residual when has to make a decision.   ED is more of a px. Reduced voice family. Plan : Spravato  06/25/23 appt noted: with W Med:  duloxetine  120, Concerta  36 mg AM, Lyrica  100 HS, no ropinirole .   Vraylar  1.5 daily No SE Received Spravato 56 mg today.  Experienced very mild dissociation and no sig HA, N.  But some dizziness.  Resolved by end of 2 hours observation.  Able to leave office without assistance.  Ongoing dep without change.  Slow, reduced cognition, anhedonia, forgetful, low motivation. No other concerns with meds.   06/27/23 appt noted:  Med: Med:  duloxetine  120, Concerta  36 mg AM, Lyrica  100 HS, no ropinirole .   Stopped Vraylar  1.5 daily No SE Received Spravato 84 mg todayfor the first time..  Experienced very mild dissociation and no sig HA, N.  But some dizziness.  Resolved by end of 2 hours observation.  Able to leave office without assistance.  Ongoing dep without change.  Slow, reduced cognition, anhedonia, forgetful, low motivation. No other concerns with meds.   07/02/23 appt oted: Med: Med:  duloxetine  90, Concerta  36 mg AM, Lyrica  100 HS, no ropinirole .   Stopped Vraylar  1.5 daily No SE Received Spravato 84 mg today.  Experienced very mild dissociation and no sig HA, N.  But some dizziness.  Resolved by end of 2 hours observation.  Mildly drowsy at end of session. BC of gait px he needed to use WC to leave the office.  Per wife gait gradually worsened over a couple of years but accelerated recently in 3 mos or so.  Short gait.  Not due to pain.  Pending neuro appt.  MRI last week not read yet. No problems with meds. Wife noticed he's shown more initiative at home since [place] ex doing crossword puzzles.  More engaged.  He feels lighter already with it.  07/15/23 appt noted:  Med: Med:  duloxetine  90, Concerta  36 mg AM, Lyrica  100 HS, no ropinirole .  No SE Received Spravato  84 mg today.  Experienced very mild dissociation and no sig HA, N.  But some dizziness.  Resolved by end of 2 hours observation.  Mildly drowsy at end of session. BC of gait px he needed to use WC to leave the office. Seen with W.   W noted clear benefit Spravato with stronger voice, spontaneous chores he wasn't doing.  More engaged in coverstation.  Pt agrees. Disc concerns over gait px and his neuro visit and MRI scan suggesting Fahr's DZ.  He'll discuss further with DR. Tat tomorrow.  07/18/23 appt noted:  Med: Med:  duloxetine  90, Concerta  36 mg AM, Lyrica  100 HS, no ropinirole .  No SE Received Spravato 84 mg today.  Experienced very mild dissociation and no sig HA, N.  But some dizziness.  Resolved by end of 2 hours observation.  Mildly drowsy at end of session. BC of gait px he needed to use WC to leave the office. No med concerns.  Seen with W.    Both agree dep is better .  More affective range.  More socially engaged.  Quicker thought.  More active .  Less down and less negative.  07/23/23 appt noted: Med: Med:  duloxetine  90, Concerta  36 mg AM, Lyrica  100 HS, no ropinirole .  No SE Received Spravato 84 mg today.  Experienced very mild dissociation and no sig HA, N.  But some dizziness.  Resolved by end of 2 hours observation.  Mildly drowsy at end of session. BC of gait px he needed to use WC to leave the office. No med concerns.  Seen with W.    Dep is still improved.  But is dealing with poor gait, weak and slow.  Will start neurorehab today.   Anxiety re: OCD manageable. Thought and affect quicker and more responsive .  Humor returned.  07/25/23 appt:  Med: Med:  duloxetine  90, Concerta  36 mg AM, Lyrica  100 HS, no ropinirole .  No SE Received Spravato 84 mg today.  Experienced very mild dissociation and no sig HA, N.  But some dizziness.  Resolved by end of 2 hours observation.  Mildly drowsy at end of session. BC of gait px he needed to use WC to leave the office. No med  concerns.  Seen with W.    Overall mood still improving but not 100%.  Starting neurorehab for weakness.  No new med concerns.  Satisfied with meds.  07/30/23 appt noted:  Med: Med:  duloxetine  90, Concerta  36 mg AM, Lyrica  100 HS, no ropinirole .  No SE Received Spravato 84 mg today.  Experienced very mild dissociation and no sig HA, N.  But some dizziness.  Resolved by end of 2 hours observation.  Mildly drowsy at end of session. BC of gait px he needed to use WC to leave the office. No med concerns.  Seen with W.    Both agree mood is improving and activity and interest.  Not 100% but limited by mobility and starting PT.    08/15/23 appt noted:  Med:  duloxetine  90, Concerta  36 mg AM, Lyrica  100  HS, no ropinirole .  No SE Received Spravato 56 mg today.  Experienced very mild dissociation and no sig HA, N.  But some dizziness.  Resolved by end of 2 hours observation.  Mildly drowsy at end of session.  But less than when on 84 mg. BC of gait px he needed to use WC to leave the office. No med concerns.  Seen with W.    Less drowsy at  end of time here than when got 84 mg daily. They both agree good response with dep and anxiety.  More involved and better interaction socially.  More initiative.  08/20/23 appt noted:  Med:  duloxetine  90, Concerta  36 mg AM, Lyrica  100 HS, no ropinirole .  No SE Received Spravato 56 mg today.  Experienced very mild dissociation and no sig HA, N.  But some dizziness.  Resolved by end of 2 hours observation.  Mildly drowsy at end of session.  But less than when on 84 mg. BC of gait px he needed to use WC to leave the office. No med concerns.  Seen with W.   She's still concerned he sleeps too much.  He's not sure why he does so.  Overall mood, initiative, socialization is better. Less drowsy at  end of time here than when got 84 mg daily.  08/27/23 appt noted:  Med:  duloxetine  90, Concerta  36 mg AM, Lyrica  100 HS, no ropinirole .  No SE Received Spravato 84 mg  today.  Experienced very mild dissociation and no sig HA, N.  But some dizziness.  Resolved by end of 2 hours observation.  Mildly drowsy at end of session.  Walking is some better with PT.  Wife agrees.  He's more interested, responsive, engaged.  Better cognition. After Spravato 56 he does not have to nap when goes home. After 84 mg needed to nap but he wants to increase back to 84 mg to get max effect. More sedate after 84 mg today but is able to clear by end of session.  But wants to continue it.  Over all doing pretty well  with less dep and more engagement and activity including spontaneously cleaning things.   09/10/23 appt noted:  Med:  duloxetine  90, Concerta  36 mg AM, Lyrica  100 HS, no ropinirole .  No SE Received Spravato 84 mg today.  Experienced very mild dissociation and no sig HA, N.  But some dizziness.  Resolved by end of 2 hours observation.  Mildly drowsy at end of session.  Mood continues to improve overall and anxiety better too.  Walking is improving.  Cognition and emotional engagment, activity are all better. Plan no med changes  09/24/23 appt noted: Med: Med:  duloxetine  90, Concerta  36 mg AM, Lyrica  100 HS, no ropinirole .  No SE Received Spravato 84 mg today.  Experienced very mild dissociation and no sig HA, N.  But some dizziness.  Resolved by end of 2 hours observation.  Mildly drowsy at end of session.  Mood continues to improve overall and anxiety better too.  Walking is improving.  Cognition and emotional engagment, activity are all better. Asks about reducing duloxetine  bc ED issues.   ECT-MADRS    Flowsheet Row Office Visit from 06/18/2023 in [facility] Office Visit from 04/16/2023 in [facility]  MADRS Total Score 37 36   [id number]    Flowsheet Row Office Visit from 03/15/2020 in [facility] Total Score 3  PHQ-9 Total Score 9     B schizophrenic SUI.  After M's death. PCP Cecil Ee at [place] [facility] at 73 years old.  Prior psychiatric medication trials include  Lexapro  20, citalopram NR, clomipramine weight gain, paroxetine, fluoxetine, Luvox, Trintellix,   Increase Lexapro  back to 20 mg January 2020. & 10/2020 bupropion ,  Auvelity NR  Abilify  10 fatigue,  Cerefolin NAC, and   Naltrexone  sexual SE Lyrica  150 tired  pramipexole,  ropinirole  Adderall XR & IR sexual SE,  Ritalin  30, Concerta  72 mg AM NR modafinil  and Nuvigil,   History [employer] OCD  Review of Systems:  Review of Systems  Constitutional:  Positive for fatigue. Negative for fever.  Cardiovascular:  Negative for chest pain and palpitations.  Genitourinary:        ED  Musculoskeletal:  Positive for arthralgias, gait problem and myalgias.  Neurological:  Positive for weakness. Negative for syncope.  Psychiatric/Behavioral:  Negative for agitation, behavioral problems, confusion, decreased concentration, dysphoric mood, hallucinations, self-injury, sleep disturbance and suicidal ideas. The patient is nervous/anxious. The patient is not hyperactive.     Medications: I have reviewed the patient's current medications.  Current Outpatient Medications  Medication Sig Dispense Refill   ALPRAZolam  (XANAX ) 0.25 MG tablet TAKE 1 TABLET (0.25 MG TOTAL) BY MOUTH 2 (TWO) TIMES DAILY AS NEEDED FOR ANXIETY OR SLEEP. 30 tablet 1   aspirin 81 MG tablet Take 81 mg by mouth daily.     Cholecalciferol (VITAMIN D-3) 5000 units TABS Take 5,000 Units by mouth daily.      DULoxetine  (CYMBALTA ) 30 MG capsule Take 3 capsules (90 mg total) by mouth daily. 270 capsule 0   methylphenidate  (RITALIN ) 10 MG tablet Take 3 tablets (30 mg total) by mouth daily as needed. 60 tablet 0   methylphenidate  36 MG PO CR tablet Take 1 tablet (36 mg total) by mouth daily. 30 tablet 0   naltrexone  (DEPADE) 50 MG tablet Take 0.5 tablets (25 mg total) by mouth daily. 45 tablet 1    pregabalin  (LYRICA ) 50 MG capsule Take 1 capsule (50 mg total) by mouth at bedtime. 90 capsule 0   simvastatin (ZOCOR) 20 MG tablet Take 20 mg by mouth at bedtime.     ZEPBOUND  10 MG/0.5ML Pen Inject 10 mg into the skin once a week.     No current facility-administered medications for this visit.    Medication Side Effects: None sexual SE are better not  All gone.  Allergies:  Allergies  Allergen Reactions   E.E.S. [Erythromycin] Hives   Macrolides And Ketolides Other (See Comments)    EES    Rosuvastatin     Other reaction(s): cramps    Past Medical History:  Diagnosis Date   Allergy    Anemia    iron- pt denies    Anxiety    Aortic cusp regurgitation    Carotid artery occlusion    Constipation    Coronary artery stenosis    Hyperlipidemia    Lactose intolerance    Major depression, recurrent, chronic (HCC)    Obesity    OCD (obsessive compulsive disorder)    OSA (obstructive sleep apnea)    Other chronic pain    Periodic limb movement disorder    Periodic limb movements of sleep    Prediabetes    Pure hypercholesterolemia    Restless legs    Sleep apnea    wears cpap    Vitamin D deficiency     Family History  Problem Relation Age of Onset   Cancer Mother        breast and ovarian   Anxiety disorder Mother    Breast cancer Mother    Ovarian cancer Mother    Depression Father        bi-polar   Hyperlipidemia Father    Heart disease Father    Sudden death Father    Bipolar disorder Father    Sleep apnea Father    Obesity Father    Depression Son    Colon cancer Neg Hx    Colon polyps Neg Hx    Esophageal cancer Neg Hx    Rectal cancer Neg Hx    Stomach cancer Neg Hx     Social History   Socioeconomic History   Marital status: Married    Spouse name: Not on file   Number of children: Not on file   Years of education: Not on file   Highest education level: Not on file  Occupational History   Occupation: retired Pensions consultant  Tobacco Use    Smoking status: Never   Smokeless tobacco: Never  Substance and Sexual Activity   Alcohol use: Yes    Comment: occasionally    Drug use: No   Sexual activity: Not on file  Other Topics Concern   Not on file  Social History Narrative   Not on file   Social Drivers of Health   Financial Resource Strain: Not on file  Food Insecurity: No Food Insecurity (01/25/2022)   Received from [facility] visits prior to 03/24/2022., [facility]   Hunger Vital Sign    Within the past 12 months, you worried that your food would run out before you got the money to buy more.: Never true    Within the past 12 months, the food you bought just didn't last and you didn't have money to get more.: Never true  Transportation Needs: No Transportation Needs (01/25/2022)   Received from [facility] visits prior to 03/24/2022.   PRAPARE - Administrator, Civil Service (Medical): No    Lack of Transportation (Non-Medical): No  Physical Activity: Not on file  Stress: Not on file  Social Connections: Not on file  Intimate Partner Violence: Not on file    Past Medical History, Surgical history, Social history, and Family history were reviewed and updated as appropriate.   Please see review of systems for further details on the patient's review from today.   Objective:   Physical Exam:  There were no vitals taken for this visit.  Physical Exam Constitutional:      General: He is not in acute distress.    Appearance: He is obese.  Musculoskeletal:        General: No deformity.  Neurological:     Mental Status: He is alert and oriented to person, place, and time.     Cranial Nerves: No dysarthria.     Motor: Weakness present.     Coordination: Coordination abnormal.     Comments: Shortened gait is better.  No sig tremor.  less Slow but still not normal.  Psychiatric:        Attention and Perception: Attention and perception normal. He  does not perceive auditory or visual hallucinations.        Mood and Affect: Mood is anxious. Mood is not depressed. Affect is not labile, blunt, angry or inappropriate.        Speech:  Speech normal.        Behavior: Behavior is not slowed. Behavior is cooperative.        Thought Content: Thought content normal. Thought content is not paranoid or delusional. Thought content does not include homicidal or suicidal ideation. Thought content does not include suicidal plan.        Cognition and Memory: Cognition and memory normal.        Judgment: Judgment normal.     Comments: Insight intact Depression 80% better OCD is about the same and not the main problem More expressive and more normal affect.  Quicker responses.    November 06, 2018: [place] Cog test in office within normal limits MMSE 28/30. Animal fluency 17 . (borderline) Taken as a whole, no indication to pursue neuropsychological testing.  Mini-Mental status exam 28/30 on 10/27/20.  No evidence of dementia.  Lab Review:   Vitamin D level acceptable at 54.5.   Normal B12 and folate and TSH in last couple of years..  Echocardiogram is stable re: AVR over the last 8 years and not likely the cause of lethargy.   06/28/23 MRI head:  IMPRESSION: 1. No acute intracranial abnormality. 2. 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 Head CT may be helpful for further assessment of the degree of mineralization. 3. Findings of chronic small vessel ischemia.  SABRAres Assessment: Plan:    Lavere was seen today for follow-up, depression, anxiety, fatigue and add.  Diagnoses and all orders for this visit:  Recurrent major depression resistant to treatment [facility])  Mixed obsessional thoughts and acts  Attention deficit hyperactivity disorder (ADHD), predominantly inattentive type  Hypersomnia with sleep apnea  Mild  cognitive impairment  Restless legs syndrome  Low vitamin D level  Erectile disorder, acquired, generalized, moderate  Other chronic pain  Obstructive sleep apnea    Mr. Zalesky has a long history of depression and OCD.  Major depression with fatigue, cognitive and physical slowness, anhedonia is much worse.  Started Spravato  and improving.  Was better energy with duloxetine  90 vs Lexapro  so increased to 120 mg daily DT worsening depression.    But it was not better. So reduced it back to 90 mg daily.    Now wants to try reducing to 60 mg daily to see if ED better now that dep bettter with Spravato. Disc risk worsening dep and anxiety.   He has some compulsive checking and obsessions around the house maintenance.  When travels then tends to have less OCD bc triggered less.    Reduced Spravato to 56 mg bc seemed quite sedated recently with 84 mg .  Not as much at end of session today. The patient experienced the typical dissociation which gradually resolved over the 2-hour period of observation.  There were no complications.  Specifically the patient did not have nausea or vomiting or headache.  Blood pressures remained within normal ranges at the 40-minute and 2-hour follow-up intervals.  By the time the 2-hour observation period was met the patient was alert and oriented and able to exit without assistance.  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  See nursing note for further details. Emphasized need to relax with Spravato and not return calls, read, return chats, emails etc. Started Spravato 06/25/23.  weekly @ 84 mg with next dose.  His OCD is persistent with checking lites, stove,  etc. .  It is better at the moment DT depression being worse.  Disc CBT techniques and potential for more therapy to address. Option Dr. Marijean.  Continue  retrial Concerta  36 mg AM He wants to use it for  depression and cognitive concerns.  Discussed potential benefits, risks, and side effects of stimulants with patient to include increased heart rate, palpitations, insomnia, increased anxiety, increased irritability, or decreased appetite.  Instructed patient to contact office if experiencing any significant tolerability issues.  Extensive discussion of sleep study and he has a copy.  Why is there virtually no N3 & REM sleep?  Is that affecting daytime alertness and fatigue?  Vs how much is related to mild OSA and PLMS?  Disc this in detail.  Wrote correspondence with sleep doc about it.  Options for sleep & fatigue:  Difficult to assess  bc has been [place] and then sick for 3 weeks.   Focus on reduction in OSA Focus on improving deep stage sleep.  ? Rx low dose mirtazapine or alternatives Focus on leg movements (hx RLS/PLMS) continue Trial as had been suggested Lyrica  for FM type sx and may help RLS/PLMS.  Better dreaming on 100 mg HS and too sleepy on 150.  Decreased  to 100 mg HS for sleep and back pain and RLS No ropinirole  needed.   Disc SE.  Not having any.   Use LED Xanax  and try to avoid BZ daytime bc fatigue.  He doesn't use it much daythime.  Using 0..25-0.5 mg at night.  May need less with more Lyrica  at [facility] and try to minimize.  No hangover. We discussed the short-term risks associated with benzodiazepines including sedation and increased fall risk among others.  Discussed long-term side effect risk including dependence, potential withdrawal symptoms, and the potential eventual dose-related risk of dementia.  But recent studies from 2020 dispute this association between benzodiazepines and dementia risk. Newer studies in 2020 do not support an association with dementia.  Stimulant partially successfully used off label to augment antidepressants for depression and have resulted in improved productivity and attention.    Previous screening of memory was not suggestive of any  neuro degenerative process: Mini-Mental status exam 28/30 on 10/27/20.   Recent cognition worse as dep worsened.  Also gait is short and slow.   neuro appt.  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  Plan:    reduce duloxetine  to 60 mg daily to reduce ED  Lyrica  100 mg HS.  resumed MPH ER 36 mg AM   Spravato disc in detail.  He wants to continue.  He and wife notice he's better.  Administer now drop back to weekly  He's still improving.  Will  increase back to 84 mg .  He is tolerating it better  Follow-up   Lorene Macintosh MD, DFAPA.  Please see After Visit Summary for patient specific instructions.  Future Appointments  Date Time Provider Department Center  09/25/2023 12:30 PM Plaster, Marlon BRAVO, PT [facility]  10/02/2023 12:30 PM Plaster, Marlon BRAVO, PT [facility]  10/15/2023  1:30 PM Cottle, Lorene KANDICE Raddle., MD [facility] None  01/30/2024  3:00 PM Tat, Asberry RAMAN, DO [id number] None     No orders of the defined types were placed in this encounter.      -------------------------------

## 2023-09-24 NOTE — Progress Notes (Signed)
 NURSES NOTE:         Pt arrived for his #21 Spravato Treatment for treatment resistant depression, the starting dose was 56 mg (2 of the 28 mg nasal sprays). Pt is getting 84 mg again today after receiving 3 of the 56 mg the past 3 weeks due to sedation.  Eric [name] is a patient of Dr. Calhoun so he will follow his care throughout treatments and follow ups. Pt's Spravato is a medical authorization through buy and bill.  Spravato medication is stored at treatment center per REMS/FDA guidelines. The medication is required to be locked behind two doors per REMS/FDA protocol. Medication is also disposed of properly after each use per regulations. All documentation for REMS is completed and submitted per FDA/REMS requirements.          Began taking patient's vital signs at 9:12 AM 137/74, pulse 77, SpO2 95%. Instructed patient to blow his nose if needed then recline back to a 45 degree angle. Gave patient first dose 28 mg nasal spray, administered in each nostril as directed and observed by nurse, waited 5 more minutes for the second and third dose.  After all doses given pt did not complain of any nausea/vomiting. Pt was given water, snacks, and candy.  Assessed his 40 minute vitals, 9:55 AM, 137/74, pulse 73, SpO2 95%. Pt was on his phone still and suggest he just listen to music instead of doing other things. Explained he would be monitored for a total time of 120 minutes. Discharge vitals were taken at 10:54 AM 131/78, P 70, SpO2 95%. Pt remained on his phone doing things and informed pt he will need to not let the medication work better if he's not distracted. He verbalized information and agreed he was on his phone too much. Dr. Geoffry came to visit with patient once his thoughts were clearer to discuss how treatment went. Recommend he go home and sleep or just relax on the couch. No driving, no intense activities. Verbalized understanding. Nurse was with pt a total of 60 minutes for clinical assessment.  Pt's wife was out of town so his son was picking him up today. Instructed to call with any issues. Pt will return weekly on Tuesdays.    LOT [id number] EXP JAN 2028

## 2023-09-25 ENCOUNTER — Ambulatory Visit: Attending: Neurology | Admitting: Physical Therapy

## 2023-09-25 VITALS — BP 121/77

## 2023-09-25 DIAGNOSIS — R2681 Unsteadiness on feet: Secondary | ICD-10-CM | POA: Diagnosis not present

## 2023-09-25 DIAGNOSIS — R293 Abnormal posture: Secondary | ICD-10-CM | POA: Diagnosis not present

## 2023-09-25 DIAGNOSIS — R2689 Other abnormalities of gait and mobility: Secondary | ICD-10-CM | POA: Insufficient documentation

## 2023-09-25 DIAGNOSIS — M6281 Muscle weakness (generalized): Secondary | ICD-10-CM | POA: Insufficient documentation

## 2023-09-25 NOTE — Therapy (Signed)
 OUTPATIENT PHYSICAL THERAPY NEURO TREATMENT   Patient Name: Eric [name] MRN: [id number] DOB:[birth year], 73 y.o., male Today's Date: 09/25/2023   PCP: Regino Slater, MD   REFERRING PROVIDER: Evonnie Asberry RAMAN, DO   END OF SESSION:  PT End of Session - 09/25/23 1233     Visit Number 16    Number of Visits 17    Date for PT Re-Evaluation 10/06/23   per re-cert on 1/84/74   Authorization Type [employer]UE [employer]ICARE    Progress Note Due on Visit 9   performed on visit 9   PT Start Time 1231    PT Stop Time 1314    PT Time Calculation (min) 43 min    Equipment Utilized During Treatment Gait belt    Activity Tolerance Patient tolerated treatment well    Behavior During Therapy WFL for tasks assessed/performed;Flat affect             Past Medical History:  Diagnosis Date   Allergy    Anemia    iron- pt denies    Anxiety    Aortic cusp regurgitation    Carotid artery occlusion    Constipation    Coronary artery stenosis    Hyperlipidemia    Lactose intolerance    Major depression, recurrent, chronic (HCC)    Obesity    OCD (obsessive compulsive disorder)    OSA (obstructive sleep apnea)    Other chronic pain    Periodic limb movement disorder    Periodic limb movements of sleep    Prediabetes    Pure hypercholesterolemia    Restless legs    Sleep apnea    wears cpap    Vitamin D deficiency    Past Surgical History:  Procedure Laterality Date   COLONOSCOPY     DG BARIUM ENEMA ([facility] HX)     10-19-2010- turtous, elongated colon    HEMORRHOID SURGERY     TONSILLECTOMY  1960   Patient Active Problem List   Diagnosis Date Noted   Vitamin D deficiency 03/30/2020   Pure hypercholesterolemia 03/30/2020   Prediabetes 03/30/2020   Occlusion and stenosis of bilateral carotid arteries 03/30/2020   Obstructive sleep apnea syndrome 03/30/2020   Aortic cusp regurgitation 03/30/2020   Restless legs syndrome 03/30/2020   Moderate recurrent major  depression (HCC) 03/30/2020   Chronic pain 03/30/2020   OCD (obsessive compulsive disorder) 10/15/2017   Family history of ovarian cancer 04/19/2011   Family history of breast cancer 04/19/2011    ONSET DATE: 07/12/2023   REFERRING DIAG: G20.C (ICD-10-CM) - Primary parkinsonism (HCC)   THERAPY DIAG:  Unsteadiness on feet  Other abnormalities of gait and mobility  Muscle weakness (generalized)  Abnormal posture  Rationale for Evaluation and Treatment: Rehabilitation  SUBJECTIVE:  SUBJECTIVE STATEMENT: Pt presents w/rollator. I am thirsty. States he walked 3/4 of a mile this morning with his dog and no AD, so he is tired. Denies falls or acute changes.   Has not signed up for the PWR moves class yet. When asked why, pt reports lethargy.   Pt accompanied by: Self    PERTINENT HISTORY: PMH: Parkinsonism (Possibly due to exposure to antipsychotic medication), tardive dyskinesias, restless leg syndrome, CAD, pre-diabetes, obesity, OCD, major depression,HLD  He was not exposed to anything for very long, but wife reports that symptoms have really been developing over the last 5 to 6 weeks. He was on Abilify  for a very short time in February and March and then was on Vraylar  from March until June. The symptoms developed around that time. He is off of the Vraylar  now (just went off of it)   Per Dr. Evonnie regarding abnormal brain scan:  It does appear that there is heavy basal ganglia calcification.  We can certainly confirm this with CT.  While Fahrs disease is on the differential, the timeline for his symptoms really correlates more with the timeline for when he was placed on antipsychotic medication.   PAIN:  Are you having pain? No  PRECAUTIONS: Fall  FALLS: Has patient fallen in last 6 months?  No and almost falls grabbing the wall   LIVING ENVIRONMENT: Lives with: lives with their spouse Lives in: House/apartment Stairs: Yes: Internal: 12 steps; on left going up and External: 4 and 5 steps; none Has following equipment at home: Single point cane and Grab bars  PLOF: Independent and Leisure: doesn't have any hobbies except cleaning up the house  PATIENT GOALS: Wants to get back to his regular gait and his regular walk so he can go on trips   OBJECTIVE:  Note: Objective measures were completed at Evaluation unless otherwise noted.  DIAGNOSTIC FINDINGS: MRI brain: IMPRESSION: 1. No acute intracranial abnormality. 2. 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 Head CT may be helpful for further assessment of the degree of mineralization. 3. Findings of chronic small vessel ischemia.  COGNITION: Overall cognitive status: Within functional limits for tasks assessed   SENSATION: WFL  COORDINATION: Heel to shin: some bradykinesia RLE RAM: dysmetric BUE     POSTURE: rounded shoulders, forward head, and posterior pelvic tilt   LOWER EXTREMITY MMT:    MMT Right Eval Left Eval  Hip flexion 3- 4  Hip extension    Hip abduction    Hip adduction    Hip internal rotation    Hip external rotation    Knee flexion 4 5  Knee extension 4 4  Ankle dorsiflexion 5 5  Ankle plantarflexion    Ankle inversion    Ankle eversion    (Blank rows = not tested)  BED MOBILITY:   Pt reports some difficulties getting under the sheets    TRANSFERS: Sit to stand: Modified independence  Assistive device utilized: None     Stand to sit: Modified independence  Assistive device utilized: None     Pt reports difficulties getting up from lower surfaces or in and out of the car.   GAIT: Findings: Gait Characteristics: decreased arm swing- Right, decreased arm swing-  Left, decreased step length- Right, decreased step length- Left, decreased stride length, decreased hip/knee flexion- Right, decreased hip/knee flexion- Left, decreased ankle dorsiflexion- Right, decreased ankle dorsiflexion- Left, Right foot flat, Left foot flat, shuffling,  decreased trunk rotation, trunk flexed, and narrow BOS, Distance walked: Clinic distances, Assistive device utilized:Single point cane and None, Level of assistance: CGA, and Comments: Pt with very shuffled steps. When pt gets single HHA from therapist for a couple steps, pt able to demo improved step length   FUNCTIONAL TESTS:  5 times sit to stand: 15.6 seconds with no UE support, incr forward flexed posture in standing  Timed up and go (TUG): 27.4 seconds with no AD, CGA with en bloc turning 10 meter walk test: 62 seconds with no AD and CGA = .52 ft/sec   VITALS  Vitals:   09/25/23 1240  BP: 121/77                                                                                                                                 TREATMENT DATE:   Self-care/home management  Assessed vitals (see above) and WNL  Pt requesting confirmation that he is signed up for PD Symposium, so emailed Lauraine Bend to ask about confirmation.   Ther Act  SciFit multi-peaks level 9.0 for 8 minutes using BUE/BLEs for neural priming for reciprocal movement, dynamic cardiovascular warmup and increased amplitude of stepping. Min cues for full knee extension ROM throughout. RPE of 5-6/10 following activity   NMR 6 Blaze pods on random reach setting for improved LE coordination, single leg stability, step clearance and step length.  Performed on 2 minute intervals with 30s rest periods.  Pt requires SBA guarding.Pt performed with his hands in his pockets  Round 1:  3 pods on first step and 3 pods on second step setup.  42 hits, no UE support.  Round 2:  same setup w/pt standing on airex.  43 hits, no UE support. Notable errors/deficits:   Increased difficulty lifting RLE, poor eccentric control of RLE  Clock yourself for 2 minutes at 30spm w/8# slam ball for improved reactive balance strategies, step clearance/length and postural control. Pt w/decreased step clearance/length in posterior direction, but no LOB noted.    PATIENT EDUCATION: Education details: Continue HEP, plan to email pt if Lauraine Bend responds  Person educated: Patient Education method: Explanation, Demonstration, and Verbal cues Education comprehension: verbalized understanding, returned demonstration, verbal cues required, and needs further education  HOME EXERCISE PROGRAM: Standing PWR Moves, Seated PWR Moves  Access Code: [email or web address] URL: [email or web address] Date: 08/19/2023 Prepared by: Sheffield Senate  Exercises - Standing Marching  - 1-2 x daily - 5 x weekly - 4 sets  GOALS: Goals reviewed with patient? Yes  SHORT TERM GOALS: Target date: 08/13/2023   1. Pt will be independent with initial HEP with gait, balance, strength in order to build upon functional gains made in therapy. Baseline: pt reports doing standing PWR moves every other day Goal status: PARTIALLY MET  2.  Pt will improve TUG time to 22 seconds or less with no AD vs. LRAD in order to demo decrease fall risk. Baseline: 27.4  seconds with no AD, CGA  16.6 seconds with no AD SBA/CGA (7/23) Goal status: MET  3.  Pt will improve gait speed with LRAD to at least 1.3 ft/sec in order to demo decr fall risk/improved household mobility.  Baseline: 62 seconds with no AD = .52 ft/sec and CGA   21.7 seconds with rollator = 1.51 ft/sec Goal status: MET  4.  Pt will ambulate at least 230' with supervision with LRAD over level indoor surfaces in order to demo improved household mobility.  Baseline: pt ambulates with no AD with CGA   Ambulates 230' with supervision and rollator  Goal status: MET   LONG TERM GOALS: Target date: 09/03/2023   Pt will be independent  with final HEP with gait, balance, strength in order to build upon functional gains made in therapy. Baseline: pt has not been performing  Goal status: NOT MET  2.  Pt will improve TUG time to 13.5 seconds or less with no AD vs. LRAD in order to demo decrease fall risk. Baseline: 27.4 seconds with no AD, CGA  16.6 seconds with no AD SBA/CGA (7/23)  14 seconds with no AD (8/15) Goal status: NOT MET  3.  Pt will improve 5x sit<>stand to less than or equal to 13 sec to demonstrate improved functional strength and transfer efficiency.  Baseline: 15.6 seconds with no UE support, incr forward flexed posture in standing  11.3 seconds with no UE support (7/30) Goal status: MET  4.  Pt will improve gait speed with LRAD to at least 1.8 ft/sec in order to demo decr fall risk  Baseline: 62 seconds with no AD = .52 ft/sec and CGA  Gait speed with rollator: 34.4 seconds = .95 ft/sec (7/30)  19.7 seconds with rollator = 1.66 ft/sec (8/15) Goal status: NOT MET  5.  Pt will ambulate at least 500' with supervision with LRAD over outdoor unlevel surfaces in order to demo improved community mobility.  Baseline: has been performing with rollator mod I  Goal status: MET   PER RE-CERT LONG TERM GOALS: Target date: 10/04/2023   Pt will be independent with final HEP with gait, balance, strength in order to build upon functional gains made in therapy and verbalize going to a community fitness class.  Baseline: pt has not been performing  Goal status: ON-GOING  2.  Pt will improve TUG time to 13 seconds or less with no AD in order to demo decrease fall risk. Baseline:  14 seconds with no AD (8/15) Goal status: REVISED   4.  Pt will improve gait speed with LRAD to at least 1.8 ft/sec in order to demo decr fall risk  Baseline: 19.7 seconds with rollator = 1.66 ft/sec (8/15) Goal status: REVISED     ASSESSMENT:  CLINICAL IMPRESSION: Emphasis of skilled PT session on improved step  length/clearance, reactive balance strategies and postural control. Pt reporting increased levels of fatigue today due to walking dog for 0.6mi prior to session without use of AD. Pt continues to demonstrate shuffled gait, RLE>LLE, but was able to perform full step clearance during blaze pod activity at stairway without UE support. Pt did demonstrate poor eccentric control of RLE but no LOB noted. Pt requesting confirmation of registration to PD Symposium, so email sent to Lauraine Bend and pt will await response. Continue POC.   OBJECTIVE IMPAIRMENTS: Abnormal gait, decreased activity tolerance, decreased balance, decreased coordination, decreased knowledge of condition, decreased knowledge of use of DME, decreased mobility, difficulty walking, decreased strength, decreased  safety awareness, impaired flexibility, and postural dysfunction.   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   REHAB POTENTIAL: Good  CLINICAL DECISION MAKING: Evolving/moderate complexity  EVALUATION COMPLEXITY: Moderate  PLAN:  PT FREQUENCY: 2x/week  PT DURATION: 8 weeks plus 1x week for 4 weeks per re-cert on 1/84/74  PLANNED INTERVENTIONS: 97164- PT Re-evaluation, 97110-Therapeutic exercises, 97530- Therapeutic activity, 97112- Neuromuscular re-education, 97535- Self Care, 02859- Manual therapy, [phone]- Gait training, Patient/Family education, Balance training, Stair training, and DME instructions  PLAN FOR NEXT SESSION: Email from Lauraine? Goals and DC   Work on [employer] moves  modified for [employer] (has pt signed up yet?) work on stride length, foot clearance   Will D/C at end of scheduled appts with PD screens in 6 months   Sharbel Sahagun E Lovett Coffin, PT, DPT 09/25/2023, 1:15 PM

## 2023-10-01 ENCOUNTER — Ambulatory Visit (INDEPENDENT_AMBULATORY_CARE_PROVIDER_SITE_OTHER): Admitting: Behavioral Health

## 2023-10-01 ENCOUNTER — Ambulatory Visit

## 2023-10-01 VITALS — BP 139/68 | HR 69

## 2023-10-01 DIAGNOSIS — F339 Major depressive disorder, recurrent, unspecified: Secondary | ICD-10-CM | POA: Diagnosis not present

## 2023-10-01 NOTE — Progress Notes (Signed)
 NURSES NOTE:         Pt arrived for his #22 Spravato Treatment for treatment resistant depression, the starting dose was 56 mg (2 of the 28 mg nasal sprays). Pt is getting 84 mg again today after receiving 3 of the 56 mg the past 3 weeks due to sedation.  Eric [name] is a patient of Dr. Calhoun so he will follow his care throughout treatments and follow ups. Pt's Spravato is a medical authorization through buy and bill.  Spravato medication is stored at treatment center per REMS/FDA guidelines. The medication is required to be locked behind two doors per REMS/FDA protocol. Medication is also disposed of properly after each use per regulations. All documentation for REMS is completed and submitted per FDA/REMS requirements.          Began taking patient's vital signs at 9:13 AM 150/70, pulse 71, SpO2 95%. Instructed patient to blow his nose if needed then recline back to a 45 degree angle. Gave patient first dose 28 mg nasal spray, administered in each nostril as directed and observed by nurse, waited 5 more minutes for the second and third dose.  After all doses given pt did not complain of any nausea/vomiting. Pt was given water, snacks, and candy.  Assessed his 40 minute vitals, 9:53 AM, 148/71, pulse 67, SpO2 94%. Explained he would be monitored for a total time of 120 minutes. Discharge vitals were taken at 11:00 AM 139/68, P 69, SpO2 97%. Eric Pizza, NP came to visit with patient once his thoughts were clearer to discuss how treatment went. Recommend he go home and sleep or just relax on the couch. No driving, no intense activities. Verbalized understanding. Nurse was with pt a total of 60 minutes for clinical assessment. Pt's wife picks him up after treatment. Instructed to call with any issues. Pt will return weekly on Tuesdays.    LOT [id number] EXP FEB 2028

## 2023-10-01 NOTE — Progress Notes (Signed)
 Eric [name] [id number] March 22, 1950 73 y.o.  Subjective:   Patient ID:  Eric [name] is a 73 y.o. (DOB Oct 27, 1950) male.  Chief Complaint: No chief complaint on file.   HPI Eric [name] presents to the office today for follow-up of  treatment resistant depression (TRD).   Spravato treatment    Patient was administered Spravato 84mg  intranasally today. Patient was observed by provider throughout Spravato treatment. The patient experienced the typical dissociation which gradually resolved over the 2-hour period of observation. There were no complications. Specifically, the patient did not have any untoward side effects - feeling disconnected from themself, their thoughts, feelings and things around them, dizziness, nausea, feeling sleepy, decreased feeling of sensitivity (numbness) spinning sensation, feeling anxious, lack of energy, increased blood pressure, feeling happy or very excited, or headache. Blood pressures remained within normal ranges at the 40-minute and 2-hour follow-up intervals. By the time the 2-hour observation period was met the patient was alert and oriented and able to exit without assistance. Patient willing to continue Spravato administration for the treatment of resistant depression.    Reports Depression 3/10 today. Feels like depression is starting to improve. Pt is still cognitively impaired and drowsy at the 2 hour mark after reducing dose of Sprivato.  Wife is her to provide transportation.  No question or concerns this visit.      ECT-MADRS    Flowsheet Row Office Visit from 06/18/2023 in [facility] Office Visit from 04/16/2023 in [facility]  MADRS Total Score 37 36   PHQ2-9    Flowsheet Row Office Visit from 03/15/2020 in [facility] Total Score 3  PHQ-9 Total Score 9     Review of Systems:  Review of Systems   Constitutional: Negative.   Allergic/Immunologic: Negative.   Neurological: Negative.   Psychiatric/Behavioral:  Positive for dysphoric mood.     Medications: I have reviewed the patient's current medications.  Current Outpatient Medications  Medication Sig Dispense Refill   ALPRAZolam  (XANAX ) 0.25 MG tablet TAKE 1 TABLET (0.25 MG TOTAL) BY MOUTH 2 (TWO) TIMES DAILY AS NEEDED FOR ANXIETY OR SLEEP. 30 tablet 1   aspirin 81 MG tablet Take 81 mg by mouth daily.     Cholecalciferol (VITAMIN D-3) 5000 units TABS Take 5,000 Units by mouth daily.      DULoxetine  (CYMBALTA ) 30 MG capsule Take 3 capsules (90 mg total) by mouth daily. 270 capsule 0   methylphenidate  (RITALIN ) 10 MG tablet Take 3 tablets (30 mg total) by mouth daily as needed. 60 tablet 0   methylphenidate  36 MG PO CR tablet Take 1 tablet (36 mg total) by mouth daily. 30 tablet 0   naltrexone  (DEPADE) 50 MG tablet Take 0.5 tablets (25 mg total) by mouth daily. 45 tablet 1   pregabalin  (LYRICA ) 50 MG capsule Take 1 capsule (50 mg total) by mouth at bedtime. 90 capsule 0   simvastatin (ZOCOR) 20 MG tablet Take 20 mg by mouth at bedtime.     ZEPBOUND  10 MG/0.5ML Pen Inject 10 mg into the skin once a week.     No current facility-administered medications for this visit.    Medication Side Effects: None  Allergies:  Allergies  Allergen Reactions   E.E.S. [Erythromycin] Hives   Macrolides And Ketolides Other (See Comments)    EES    Rosuvastatin     Other reaction(s): cramps    Past Medical History:  Diagnosis Date   Allergy    Anemia    iron- pt denies    Anxiety    Aortic cusp regurgitation    Carotid artery occlusion    Constipation    Coronary artery stenosis    Hyperlipidemia    Lactose intolerance    Major depression, recurrent, chronic (HCC)    Obesity    OCD (obsessive compulsive disorder)    OSA (obstructive sleep apnea)    Other chronic pain    Periodic limb movement disorder    Periodic limb  movements of sleep    Prediabetes    Pure hypercholesterolemia    Restless legs    Sleep apnea    wears cpap    Vitamin D deficiency     Past Medical History, Surgical history, Social history, and Family history were reviewed and updated as appropriate.   Please see review of systems for further details on the patient's review from today.   Objective:   Physical Exam:  There were no vitals taken for this visit.  Physical Exam  Lab Review:  No results found for: NA, K, CL, CO2, GLUCOSE, BUN, CREATININE, CALCIUM, PROT, ALBUMIN, AST, ALT, ALKPHOS, BILITOT, GFRNONAA, GFRAA  No results found for: WBC, RBC, HGB, HCT, PLT, MCV, MCH, MCHC, RDW, LYMPHSABS, MONOABS, EOSABS, BASOSABS  No results found for: POCLITH, LITHIUM    No results found for: PHENYTOIN, PHENOBARB, VALPROATE, CBMZ   .res Assessment: Plan:    Recommendations:   Pt is very lethargic at 2 hour mark post session every visit. Needs assistance walking.    Continue Spravato treatment. Pt did not have any questions or concerns today.  Will continue to f/u with Lorene Macintosh MD for medication management.      Eric A. Marymargaret Kirker, NP             Diagnoses and all orders for this visit:  Recurrent major depression resistant to treatment [facility])     Please see After Visit Summary for patient specific instructions.  Future Appointments  Date Time Provider Department Center  10/02/2023 12:30 PM Plaster, Marlon BRAVO, PT [facility]  10/08/2023  9:00 AM Cottle, Lorene KANDICE Raddle., MD [facility] None  10/08/2023  9:00 AM CP-NURSE [facility] None  10/15/2023  9:00 AM CP-NURSE [facility] None  10/15/2023  9:00 AM Teresa Eric LABOR, NP [facility] None  10/15/2023  1:30 PM Cottle, Lorene KANDICE Raddle., MD [facility] None  10/22/2023  9:00 AM Cottle, Lorene KANDICE Raddle., MD [facility] None  10/22/2023  9:00 AM CP-NURSE CP-CP None  01/30/2024  3:00 PM Tat, Asberry RAMAN, DO [facility] None    No orders of the defined  types were placed in this encounter.   -------------------------------

## 2023-10-02 ENCOUNTER — Ambulatory Visit: Admitting: Physical Therapy

## 2023-10-02 VITALS — BP 114/69 | HR 76

## 2023-10-02 DIAGNOSIS — R2689 Other abnormalities of gait and mobility: Secondary | ICD-10-CM

## 2023-10-02 DIAGNOSIS — R2681 Unsteadiness on feet: Secondary | ICD-10-CM | POA: Diagnosis not present

## 2023-10-02 DIAGNOSIS — M6281 Muscle weakness (generalized): Secondary | ICD-10-CM | POA: Diagnosis not present

## 2023-10-02 DIAGNOSIS — R293 Abnormal posture: Secondary | ICD-10-CM

## 2023-10-02 NOTE — Therapy (Addendum)
 OUTPATIENT PHYSICAL THERAPY NEURO TREATMENT - DISCHARGE SUMMARY    Patient Name: Eric [name] MRN: [id number] DOB:Nov 18, 1950, 73 y.o., male Today's Date: 10/02/2023   PCP: Regino Slater, MD   REFERRING PROVIDER: Tat, Asberry RAMAN, DO  PHYSICAL THERAPY DISCHARGE SUMMARY  Visits from Start of Care: 17  Current functional level related to goals / functional outcomes: Pt is mod I w/ADLs w/use of rollator. Encouraged pt to use rollator at all times in community due to shuffled gait and increased fall risk per gait speed    Remaining deficits: High fall risk, shuffled gait, rigidity    Education / Equipment: HEP   Patient agrees to discharge. Patient goals were partially met. Patient is being discharged due to being pleased with the current functional level.    END OF SESSION:  PT End of Session - 10/02/23 1235     Visit Number 17    Number of Visits 17    Date for PT Re-Evaluation 10/06/23   per re-cert on 1/84/74   Authorization Type [employer]    Progress Note Due on Visit 9   performed on visit 9   PT Start Time 1233    PT Stop Time 1303   DC   PT Time Calculation (min) 30 min    Equipment Utilized During Treatment --    Activity Tolerance Patient tolerated treatment well    Behavior During Therapy WFL for tasks assessed/performed;Flat affect             Past Medical History:  Diagnosis Date   Allergy    Anemia    iron- pt denies    Anxiety    Aortic cusp regurgitation    Carotid artery occlusion    Constipation    Coronary artery stenosis    Hyperlipidemia    Lactose intolerance    Major depression, recurrent, chronic (HCC)    Obesity    OCD (obsessive compulsive disorder)    OSA (obstructive sleep apnea)    Other chronic pain    Periodic limb movement disorder    Periodic limb movements of sleep    Prediabetes    Pure hypercholesterolemia    Restless legs    Sleep apnea    wears cpap    Vitamin D deficiency     Past Surgical History:  Procedure Laterality Date   COLONOSCOPY     DG BARIUM ENEMA ([facility] HX)     10-19-2010- turtous, elongated colon    HEMORRHOID SURGERY     TONSILLECTOMY  1960   Patient Active Problem List   Diagnosis Date Noted   Vitamin D deficiency 03/30/2020   Pure hypercholesterolemia 03/30/2020   Prediabetes 03/30/2020   Occlusion and stenosis of bilateral carotid arteries 03/30/2020   Obstructive sleep apnea syndrome 03/30/2020   Aortic cusp regurgitation 03/30/2020   Restless legs syndrome 03/30/2020   Moderate recurrent major depression (HCC) 03/30/2020   Chronic pain 03/30/2020   OCD (obsessive compulsive disorder) 10/15/2017   Family history of ovarian cancer 04/19/2011   Family history of breast cancer 04/19/2011    ONSET DATE: 07/12/2023   REFERRING DIAG: G20.C (ICD-10-CM) - Primary parkinsonism (HCC)   THERAPY DIAG:  Unsteadiness on feet  Other abnormalities of gait and mobility  Muscle weakness (generalized)  Abnormal posture  Rationale for Evaluation and Treatment: Rehabilitation  SUBJECTIVE:  SUBJECTIVE STATEMENT: Pt presents w/rollator. States he is having an off day, but walked for 30 minutes prior to session. No falls   Pt accompanied by: Wife, Eric [name]     PERTINENT HISTORY: PMH: Parkinsonism (Possibly due to exposure to antipsychotic medication), tardive dyskinesias, restless leg syndrome, CAD, pre-diabetes, obesity, OCD, major depression,HLD  He was not exposed to anything for very long, but wife reports that symptoms have really been developing over the last 5 to 6 weeks. He was on Abilify  for a very short time in February and March and then was on Vraylar  from March until June. The symptoms developed around that time. He is off of the Vraylar  now (just  went off of it)   Per Dr. Evonnie regarding abnormal brain scan:  It does appear that there is heavy basal ganglia calcification.  We can certainly confirm this with CT.  While Fahrs disease is on the differential, the timeline for his symptoms really correlates more with the timeline for when he was placed on antipsychotic medication.   PAIN:  Are you having pain? No  PRECAUTIONS: Fall  FALLS: Has patient fallen in last 6 months? No and almost falls grabbing the wall   LIVING ENVIRONMENT: Lives with: lives with their spouse Lives in: House/apartment Stairs: Yes: Internal: 12 steps; on left going up and External: 4 and 5 steps; none Has following equipment at home: Single point cane and Grab bars  PLOF: Independent and Leisure: doesn't have any hobbies except cleaning up the house  PATIENT GOALS: Wants to get back to his regular gait and his regular walk so he can go on trips   OBJECTIVE:  Note: Objective measures were completed at Evaluation unless otherwise noted.  DIAGNOSTIC FINDINGS: MRI brain: IMPRESSION: 1. No acute intracranial abnormality. 2. 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 Head CT may be helpful for further assessment of the degree of mineralization. 3. Findings of chronic small vessel ischemia.  COGNITION: Overall cognitive status: Within functional limits for tasks assessed   SENSATION: WFL  COORDINATION: Heel to shin: some bradykinesia RLE RAM: dysmetric BUE     POSTURE: rounded shoulders, forward head, and posterior pelvic tilt   LOWER EXTREMITY MMT:    MMT Right Eval Left Eval  Hip flexion 3- 4  Hip extension    Hip abduction    Hip adduction    Hip internal rotation    Hip external rotation    Knee flexion 4 5  Knee extension 4 4  Ankle dorsiflexion 5 5  Ankle plantarflexion    Ankle inversion    Ankle eversion     (Blank rows = not tested)  BED MOBILITY:   Pt reports some difficulties getting under the sheets    TRANSFERS: Sit to stand: Modified independence  Assistive device utilized: None     Stand to sit: Modified independence  Assistive device utilized: None     Pt reports difficulties getting up from lower surfaces or in and out of the car.   GAIT: Findings: Gait Characteristics: decreased arm swing- Right, decreased arm swing- Left, decreased step length- Right, decreased step length- Left, decreased stride length, decreased hip/knee flexion- Right, decreased hip/knee flexion- Left, decreased ankle dorsiflexion- Right, decreased ankle dorsiflexion- Left, Right foot flat, Left foot flat, shuffling, decreased trunk rotation, trunk flexed, and narrow BOS, Distance walked: Clinic distances, Assistive device utilized:Walker - 4 wheeled, Level of assistance: Modified independence, and Comments: Pt  with very shuffled steps today but no LOB noted    FUNCTIONAL TESTS:  5 times sit to stand: 15.6 seconds with no UE support, incr forward flexed posture in standing  Timed up and go (TUG): 27.4 seconds with no AD, CGA with en bloc turning 10 meter walk test: 62 seconds with no AD and CGA = .52 ft/sec   VITALS  Vitals:   10/02/23 1239  BP: 114/69  Pulse: 76                                                                                                                                  TREATMENT DATE:   Self-care/home management  Assessed vitals (see above) and WNL   Ther Act   [facility] PT Assessment - 10/02/23 1242       Ambulation/Gait   Gait velocity 32.8' over 24.41s = 1.3 ft/s w/rollator      Balance   Balance Assessed Yes      Standardized Balance Assessment   Standardized Balance Assessment Timed Up and Go Test      Timed Up and Go Test   Normal TUG (seconds) 12.68    TUG Comments no AD         Pt requesting to do his PWR moves while in clinic:   Pt performs PWR! Moves in  seated and standing position x 10-15 reps   PWR! Up for improved posture. Min cues for open hands   PWR! Rock for improved weighshifting. Min cues for large step  PWR! Twist for improved trunk rotation. Min cues to maintain gaze on moving arm and for strong clap   PWR! Step for improved step initiation. Min cues for strong stomp to facilitate increased step clearance   Educated pt on PD screens and ensured pt scheduled prior to leaving clinic. Strongly encouraged pt to continue to exercise and email Amy Mariott to sign up for PWR moves class. Pt reports it is on his to-do list     PATIENT EDUCATION: Education details: Finalized HEP, goal results, continue to use rollator in community  Person educated: Patient and Spouse Education method: Explanation, Demonstration, and Verbal cues Education comprehension: verbalized understanding, returned demonstration, and verbal cues required  HOME EXERCISE PROGRAM: Standing PWR Moves, Seated PWR Moves  Access Code: [email or web address] URL: [email or web address] Date: 08/19/2023 Prepared by: Sheffield Senate  Exercises - Standing Marching  - 1-2 x daily - 5 x weekly - 4 sets  GOALS: Goals reviewed with patient? Yes  SHORT TERM GOALS: Target date: 08/13/2023   1. Pt will be independent with initial HEP with gait, balance, strength in order to build upon functional gains made in therapy. Baseline: pt reports doing standing PWR moves every other day Goal status: PARTIALLY MET  2.  Pt will improve TUG time to 22 seconds or less with no AD vs. LRAD in order to demo decrease fall risk. Baseline: 27.4 seconds with no AD, CGA  16.6 seconds with no AD SBA/CGA (7/23) Goal status: MET  3.  Pt will improve gait speed with LRAD to at least 1.3 ft/sec in order to demo decr fall risk/improved household mobility.  Baseline: 62 seconds with no AD = .52 ft/sec and CGA   21.7 seconds with rollator = 1.51 ft/sec Goal status: MET  4.  Pt will  ambulate at least 230' with supervision with LRAD over level indoor surfaces in order to demo improved household mobility.  Baseline: pt ambulates with no AD with CGA   Ambulates 230' with supervision and rollator  Goal status: MET   LONG TERM GOALS: Target date: 09/03/2023   Pt will be independent with final HEP with gait, balance, strength in order to build upon functional gains made in therapy. Baseline: pt has not been performing  Goal status: NOT MET  2.  Pt will improve TUG time to 13.5 seconds or less with no AD vs. LRAD in order to demo decrease fall risk. Baseline: 27.4 seconds with no AD, CGA  16.6 seconds with no AD SBA/CGA (7/23)  14 seconds with no AD (8/15) Goal status: NOT MET  3.  Pt will improve 5x sit<>stand to less than or equal to 13 sec to demonstrate improved functional strength and transfer efficiency.  Baseline: 15.6 seconds with no UE support, incr forward flexed posture in standing  11.3 seconds with no UE support (7/30) Goal status: MET  4.  Pt will improve gait speed with LRAD to at least 1.8 ft/sec in order to demo decr fall risk  Baseline: 62 seconds with no AD = .52 ft/sec and CGA  Gait speed with rollator: 34.4 seconds = .95 ft/sec (7/30)  19.7 seconds with rollator = 1.66 ft/sec (8/15) Goal status: NOT MET  5.  Pt will ambulate at least 500' with supervision with LRAD over outdoor unlevel surfaces in order to demo improved community mobility.  Baseline: has been performing with rollator mod I  Goal status: MET   PER RE-CERT LONG TERM GOALS: Target date: 10/04/2023   Pt will be independent with final HEP with gait, balance, strength in order to build upon functional gains made in therapy and verbalize going to a community fitness class.  Baseline: pt has not been performing  Goal status: MET   2.  Pt will improve TUG time to 13 seconds or less with no AD in order to demo decrease fall risk. Baseline:  14 seconds with no AD (8/15); 12.68s  no AD (9/10) Goal status: MET   4.  Pt will improve gait speed with LRAD to at least 1.8 ft/sec in order to demo decr fall risk  Baseline: 19.7 seconds with rollator = 1.66 ft/sec (8/15); 24.41s w/rollator = 1.34 ft/s (9/10) Goal status: NOT MET      ASSESSMENT:  CLINICAL IMPRESSION: Emphasis of skilled PT session on LTG assessment, review of HEP and DC from PT. Pt has met 2 of 3 LTGs, improving his time on TUG to 12s and reporting independence w/HEP. Pt reviewed his standing and seated PWR moves today and did require min cues for proper technique, but did not have his handout present to follow. Pt did not meet his gait speed goal, averaging 1.3 ft/s w/rollator. Due to high fall risk, recommend pt continue to use rollator at all times in community for reduced fall risk. Pt is scheduled for PD screens in 47mo and will be attending PD Symposium next week.  Continue to encourage pt to participate in  PD-specific exercise classes to continue fitness post-DC, which pt is working on.   OBJECTIVE IMPAIRMENTS: Abnormal gait, decreased activity tolerance, decreased balance, decreased coordination, decreased knowledge of condition, decreased knowledge of use of DME, decreased mobility, difficulty walking, decreased strength, decreased safety awareness, impaired flexibility, and postural dysfunction.   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   REHAB POTENTIAL: Good  CLINICAL DECISION MAKING: Evolving/moderate complexity  EVALUATION COMPLEXITY:  Moderate  PLAN:  PT FREQUENCY: 2x/week  PT DURATION: 8 weeks plus 1x week for 4 weeks per re-cert on 1/84/74  PLANNED INTERVENTIONS: 97164- PT Re-evaluation, 97110-Therapeutic exercises, 97530- Therapeutic activity, 97112- Neuromuscular re-education, 97535- Self Care, [id number]- Manual therapy, [phone]- Gait training, Patient/Family education, Balance training, Stair training, and DME instructions   Marlon BRAVO Slayter Moorhouse, PT, DPT 10/02/2023, 1:11 PM

## 2023-10-08 ENCOUNTER — Ambulatory Visit

## 2023-10-08 ENCOUNTER — Ambulatory Visit (INDEPENDENT_AMBULATORY_CARE_PROVIDER_SITE_OTHER): Admitting: Psychiatry

## 2023-10-08 ENCOUNTER — Encounter: Payer: Self-pay | Admitting: Psychiatry

## 2023-10-08 VITALS — BP 138/71 | HR 61

## 2023-10-08 DIAGNOSIS — F339 Major depressive disorder, recurrent, unspecified: Secondary | ICD-10-CM

## 2023-10-08 DIAGNOSIS — G4710 Hypersomnia, unspecified: Secondary | ICD-10-CM

## 2023-10-08 DIAGNOSIS — F900 Attention-deficit hyperactivity disorder, predominantly inattentive type: Secondary | ICD-10-CM

## 2023-10-08 DIAGNOSIS — G3184 Mild cognitive impairment, so stated: Secondary | ICD-10-CM

## 2023-10-08 DIAGNOSIS — R7989 Other specified abnormal findings of blood chemistry: Secondary | ICD-10-CM

## 2023-10-08 DIAGNOSIS — F422 Mixed obsessional thoughts and acts: Secondary | ICD-10-CM

## 2023-10-08 DIAGNOSIS — F5221 Male erectile disorder: Secondary | ICD-10-CM

## 2023-10-08 DIAGNOSIS — G2581 Restless legs syndrome: Secondary | ICD-10-CM

## 2023-10-08 NOTE — Progress Notes (Signed)
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  01/21/23 appt noted: Worrying too much bc OCD.  Trying to decide about how to deal with a trigger lately.  OCD driving me crazy wanting to make things perfect.   Other than the trigger had a nice time since here.  GS here for 5 days at 73 yo.  Enjoyed that.   Taking semaglutide   down to 250# from 283#. Card at [facility] says he does not have Aortic aneurysm.  Measuring is OK.  Will FU with MRI in June.   Some diarrhea lately. Cannot stop worrying.  Triggered by the plumbing issue. Meds as above.  No SE of sig.   Plan:  switched to duloxetine  to 90 mg daily.  Off Lexapro .  Others as noted. Lyrica  100 mg HS. Has held Ritalin  10 TID, can resume as long as SBP below 140 per card.  none needed ropinirole  3 mg pm.  No risperidone .  02/18/23 appt noted:  with [name] Meds: as above. Taking Ritalin  30% of night.  Prn Xanax  rarely if gets off sleep cycle. No SE.   Terribly dep but not as bad as in the past. Taking Ritalin  appears to help significantly and started doing that.  Tend to stay in bed till noon but pattern of up late.   OCD about the same with some avoidance of detail work.   Afraid I'll throw away something I need.   She doesn't know whether Ritalin  helps No sig RLS and wife agrees. Thinks of deceased B at the holidays but worries over son Deward too.   Plan: Increase duloxetine  to 120 mg daily.  Off Lexapro .  Others as noted. Lyrica  100 mg HS.  resumed Ritalin  30 AM with some benefit. no risperidone .  03/19/23 appt noted: Psych med:  duloxetine  120, Ritalin  20 am  missing doses, Lyrica  100 HS, no risperidone , no ropinirole .   No improvement in depression since increase dose duloxetine .  More dep than usual.  Worrying about everything.  Including taxes.  All I can see is a black hole.  Making him feel negative about everything.   Keeping him from doing things.   OCD is not much different from when on Lexapro .  Wife agrees dep and distracted. Plan: resumed Ritalin  30 AM with some benefit. Resume Abilify  2 mg AM for dep.  04/16/23 appt noted:  wife here Psych med:  duloxetine  120, resumed Concerta  36 mg AM, Lyrica  100 HS, Abilify  2, no ropinirole .   Wife noted he seemed manic yesterday.  Invited window estimate against wife's will and without her input.   No mania noted until yesterday. He didn't feel manic yesterday.   He's noticed no change with Abilify  and still feels flat and a little low.  From MPH energy and mood a little better.  Sleeps until 10-11 am about 12 hours. And asleep that whole time. Wife says he doesn't eat much bc he's not awake much. Trouble with hygiene.   Plan: Meds: continue duloxetine  to 120 mg daily.  Off Lexapro .  Lyrica  100 mg HS.  resumed Ritalin  30 AM with some benefit. DC Abiilify Vraylar  1.5 mg every other day Spravato disc in detail.  He wants to pursue if not better with Vraylar .  06/18/23 appt noted: with W Med:  duloxetine  120, resumed Concerta  36 mg AM, Lyrica  100 HS, no ropinirole .   Vraylar  1.5 every other day, no benefit Spravato denied by insurance but is being appealed. More trouble walking and shorter gait.  Neuro  in [place] appt in Sept.   Looking to get in in [place].  Can't be much more dep than I am.   OCD residual when has to make a decision.   ED is more of a px. Reduced voice family. Plan : Spravato  06/25/23 appt noted: with W Med:  duloxetine  120, Concerta  36 mg AM, Lyrica  100 HS, no ropinirole .   Vraylar  1.5 daily No SE Received Spravato 56 mg today.  Experienced very mild dissociation and no sig HA, N.  But some dizziness.  Resolved by end of 2 hours observation.  Able to leave office without assistance.  Ongoing dep without change.  Slow, reduced cognition, anhedonia, forgetful, low motivation. No other concerns with meds.   06/27/23 appt noted:  Med: Med:  duloxetine  120, Concerta  36 mg AM, Lyrica  100 HS, no ropinirole .   Stopped Vraylar  1.5 daily No SE Received Spravato 84 mg todayfor the first time..  Experienced very mild dissociation and no sig HA, N.  But some dizziness.  Resolved by end of 2 hours observation.  Able to leave office without assistance.  Ongoing dep without change.  Slow, reduced cognition, anhedonia, forgetful, low motivation. No other concerns with meds.   07/02/23 appt oted: Med: Med:  duloxetine  90, Concerta  36 mg AM, Lyrica  100 HS, no ropinirole .   Stopped Vraylar  1.5 daily No SE Received Spravato 84 mg today.  Experienced very mild dissociation and no sig HA, N.  But some dizziness.  Resolved by end of 2 hours observation.  Mildly drowsy at end of session. BC of gait px he needed to use WC to leave the office.  Per wife gait gradually worsened over a couple of years but accelerated recently in 3 mos or so.  Short gait.  Not due to pain.  Pending neuro appt.  MRI last week not read yet. No problems with meds. Wife noticed he's shown more initiative at home since [place] ex doing crossword puzzles.  More engaged.  He feels lighter already with it.  07/15/23 appt noted:  Med: Med:  duloxetine  90, Concerta  36 mg AM, Lyrica  100 HS, no ropinirole .  No SE Received Spravato  84 mg today.  Experienced very mild dissociation and no sig HA, N.  But some dizziness.  Resolved by end of 2 hours observation.  Mildly drowsy at end of session. BC of gait px he needed to use WC to leave the office. Seen with W.   W noted clear benefit Spravato with stronger voice, spontaneous chores he wasn't doing.  More engaged in coverstation.  Pt agrees. Disc concerns over gait px and his neuro visit and MRI scan suggesting Fahr's DZ.  He'll discuss further with DR. Tat tomorrow.  07/18/23 appt noted:  Med: Med:  duloxetine  90, Concerta  36 mg AM, Lyrica  100 HS, no ropinirole .  No SE Received Spravato 84 mg today.  Experienced very mild dissociation and no sig HA, N.  But some dizziness.  Resolved by end of 2 hours observation.  Mildly drowsy at end of session. BC of gait px he needed to use WC to leave the office. No med concerns.  Seen with W.    Both agree dep is better .  More affective range.  More socially engaged.  Quicker thought.  More active .  Less down and less negative.  07/23/23 appt noted: Med: Med:  duloxetine  90, Concerta  36 mg AM, Lyrica  100 HS, no ropinirole .  No SE Received Spravato 84 mg today.  Experienced very mild dissociation and no sig HA, N.  But some dizziness.  Resolved by end of 2 hours observation.  Mildly drowsy at end of session. BC of gait px he needed to use WC to leave the office. No med concerns.  Seen with W.    Dep is still improved.  But is dealing with poor gait, weak and slow.  Will start neurorehab today.   Anxiety re: OCD manageable. Thought and affect quicker and more responsive .  Humor returned.  07/25/23 appt:  Med: Med:  duloxetine  90, Concerta  36 mg AM, Lyrica  100 HS, no ropinirole .  No SE Received Spravato 84 mg today.  Experienced very mild dissociation and no sig HA, N.  But some dizziness.  Resolved by end of 2 hours observation.  Mildly drowsy at end of session. BC of gait px he needed to use WC to leave the office. No med  concerns.  Seen with W.    Overall mood still improving but not 100%.  Starting neurorehab for weakness.  No new med concerns.  Satisfied with meds.  07/30/23 appt noted:  Med: Med:  duloxetine  90, Concerta  36 mg AM, Lyrica  100 HS, no ropinirole .  No SE Received Spravato 84 mg today.  Experienced very mild dissociation and no sig HA, N.  But some dizziness.  Resolved by end of 2 hours observation.  Mildly drowsy at end of session. BC of gait px he needed to use WC to leave the office. No med concerns.  Seen with W.    Both agree mood is improving and activity and interest.  Not 100% but limited by mobility and starting PT.    08/15/23 appt noted:  Med:  duloxetine  90, Concerta  36 mg AM, Lyrica  100  HS, no ropinirole .  No SE Received Spravato 56 mg today.  Experienced very mild dissociation and no sig HA, N.  But some dizziness.  Resolved by end of 2 hours observation.  Mildly drowsy at end of session.  But less than when on 84 mg. BC of gait px he needed to use WC to leave the office. No med concerns.  Seen with W.    Less drowsy at  end of time here than when got 84 mg daily. They both agree good response with dep and anxiety.  More involved and better interaction socially.  More initiative.  08/20/23 appt noted:  Med:  duloxetine  90, Concerta  36 mg AM, Lyrica  100 HS, no ropinirole .  No SE Received Spravato 56 mg today.  Experienced very mild dissociation and no sig HA, N.  But some dizziness.  Resolved by end of 2 hours observation.  Mildly drowsy at end of session.  But less than when on 84 mg. BC of gait px he needed to use WC to leave the office. No med concerns.  Seen with W.   She's still concerned he sleeps too much.  He's not sure why he does so.  Overall mood, initiative, socialization is better. Less drowsy at  end of time here than when got 84 mg daily.  08/27/23 appt noted:  Med:  duloxetine  90, Concerta  36 mg AM, Lyrica  100 HS, no ropinirole .  No SE Received Spravato 84 mg  today.  Experienced very mild dissociation and no sig HA, N.  But some dizziness.  Resolved by end of 2 hours observation.  Mildly drowsy at end of session.  Walking is some better with PT.  Wife agrees.  He's more interested, responsive, engaged.  Better cognition. After Spravato 56 he does not have to nap when goes home. After 84 mg needed to nap but he wants to increase back to 84 mg to get max effect. More sedate after 84 mg today but is able to clear by end of session.  But wants to continue it.  Over all doing pretty well  with less dep and more engagement and activity including spontaneously cleaning things.   09/10/23 appt noted:  Med:  duloxetine  90, Concerta  36 mg AM, Lyrica  100 HS, no ropinirole .  No SE Received Spravato 84 mg today.  Experienced very mild dissociation and no sig HA, N.  But some dizziness.  Resolved by end of 2 hours observation.  Mildly drowsy at end of session.  Mood continues to improve overall and anxiety better too.  Walking is improving.  Cognition and emotional engagment, activity are all better. Plan no med changes  09/24/23 appt noted: Med: Med:  duloxetine  90, Concerta  36 mg AM, Lyrica  100 HS, no ropinirole .  No SE Received Spravato 84 mg today.  Experienced very mild dissociation and no sig HA, N.  But some dizziness.  Resolved by end of 2 hours observation.  Mildly drowsy at end of session.  Mood continues to improve overall and anxiety better too.  Walking is improving.  Cognition and emotional engagment, activity are all better. Asks about reducing duloxetine  bc ED issues. Plan: reduce duloxetine  to 60 mg daily to reduce ED  10/08/23 appt Med: Med:  duloxetine  60, Concerta  36 mg AM, Lyrica  100 HS, no ropinirole .  Received Spravato 84 mg today.  Experienced very mild dissociation and no sig HA, N.  But some dizziness.  Resolved by end of 2 hours observation.  Mildly drowsy at end of  session.  Some dysphoria this week but he and wife continue to see  improvement and benefit with meds and Spravato.   SE concerta  feeling jittery and skips it at times.   ECT-MADRS    Flowsheet Row Office Visit from 06/18/2023 in [facility] Office Visit from 04/16/2023 in [facility]  MADRS Total Score 37 36   PHQ2-9    Flowsheet Row Office Visit from 03/15/2020 in [facility] Total Score 3  PHQ-9 Total Score 9     B schizophrenic SUI. After M's death. PCP Cecil Ee at [place]  [facility] at 73 years old.  Prior psychiatric medication trials include  Lexapro  20, citalopram NR, clomipramine weight gain, paroxetine, fluoxetine, Luvox, Trintellix,   Increase Lexapro  back to 20 mg January 2020. & 10/2020 bupropion ,  Auvelity NR  Abilify  10 fatigue,  Cerefolin NAC, and   Naltrexone  sexual SE Lyrica  150 tired  pramipexole,  ropinirole  Adderall XR & IR sexual SE,  Ritalin  30, Concerta  72 mg AM NR modafinil  and Nuvigil,   History [employer] OCD  Review of Systems:  Review of Systems  Constitutional:  Positive for fatigue. Negative for fever.  Cardiovascular:  Positive for palpitations. Negative for chest pain.  Genitourinary:        ED  Musculoskeletal:  Positive for arthralgias, gait problem and myalgias.  Neurological:  Positive for weakness. Negative for syncope.  Psychiatric/Behavioral:  Negative for agitation, behavioral problems, confusion, decreased concentration, dysphoric mood, hallucinations, self-injury, sleep disturbance and suicidal ideas. The patient is nervous/anxious. The patient is not hyperactive.     Medications: I have reviewed the patient's current medications.  Current Outpatient Medications  Medication Sig Dispense Refill   ALPRAZolam  (XANAX ) 0.25 MG tablet TAKE 1 TABLET (0.25 MG TOTAL) BY MOUTH 2 (TWO) TIMES DAILY AS NEEDED FOR ANXIETY OR SLEEP. 30 tablet 1   aspirin 81 MG tablet Take 81 mg by  mouth daily.     Cholecalciferol (VITAMIN D-3) 5000 units TABS Take 5,000 Units by mouth daily.      DULoxetine  (CYMBALTA ) 30 MG capsule Take 3 capsules (90 mg total) by mouth daily. 270 capsule 0   methylphenidate  (RITALIN ) 10 MG tablet Take 3 tablets (30 mg total) by mouth daily as needed. 60 tablet 0   methylphenidate  36 MG PO CR tablet Take 1 tablet (36 mg total) by mouth daily. 30 tablet 0   naltrexone  (DEPADE) 50 MG tablet Take 0.5 tablets (25 mg total) by mouth daily. 45 tablet 1   pregabalin  (LYRICA ) 50 MG capsule Take 1 capsule (50 mg total) by mouth at bedtime. 90 capsule 0   simvastatin (ZOCOR) 20 MG tablet Take 20 mg by mouth at bedtime.     ZEPBOUND  10 MG/0.5ML Pen Inject 10 mg into the skin once a week.     No current facility-administered medications for this visit.    Medication Side Effects: None sexual SE are better not  All gone.  Allergies:  Allergies  Allergen Reactions   E.E.S. [Erythromycin] Hives   Macrolides And Ketolides Other (See Comments)    EES    Rosuvastatin     Other reaction(s): cramps    Past Medical History:  Diagnosis Date   Allergy    Anemia    iron- pt denies    Anxiety    Aortic cusp regurgitation    Carotid artery occlusion    Constipation    Coronary artery  stenosis    Hyperlipidemia    Lactose intolerance    Major depression, recurrent, chronic (HCC)    Obesity    OCD (obsessive compulsive disorder)    OSA (obstructive sleep apnea)    Other chronic pain    Periodic limb movement disorder    Periodic limb movements of sleep    Prediabetes    Pure hypercholesterolemia    Restless legs    Sleep apnea    wears cpap    Vitamin D deficiency     Family History  Problem Relation Age of Onset   Cancer Mother        breast and ovarian   Anxiety disorder Mother    Breast cancer Mother    Ovarian cancer Mother    Depression Father        bi-polar   Hyperlipidemia Father    Heart disease Father    Sudden death Father     Bipolar disorder Father    Sleep apnea Father    Obesity Father    Depression Son    Colon cancer Neg Hx    Colon polyps Neg Hx    Esophageal cancer Neg Hx    Rectal cancer Neg Hx    Stomach cancer Neg Hx     Social History   Socioeconomic History   Marital status: Married    Spouse name: Not on file   Number of children: Not on file   Years of education: Not on file   Highest education level: Not on file  Occupational History   Occupation: retired Pensions consultant  Tobacco Use   Smoking status: Never   Smokeless tobacco: Never  Substance and Sexual Activity   Alcohol use: Yes    Comment: occasionally    Drug use: No   Sexual activity: Not on file  Other Topics Concern   Not on file  Social History Narrative   Not on file   Social Drivers of Health   Financial Resource Strain: Not on file  Food Insecurity: No Food Insecurity (01/25/2022)   Received from [facility] visits prior to 03/24/2022[facility]   Hunger Vital Sign    Within the past 12 months, you worried that your food would run out before you got the money to buy more.: Never true    Within the past 12 months, the food you bought just didn't last and you didn't have money to get more.: Never true  Transportation Needs: No Transportation Needs (01/25/2022)   Received from [facility], [facility] visits prior to 03/24/2022.   PRAPARE - Administrator, Civil Service (Medical): No    Lack of Transportation (Non-Medical): No  Physical Activity: Not on file  Stress: Not on file  Social Connections: Not on file  Intimate Partner Violence: Not on file    Past Medical History, Surgical history, Social history, and Family history were reviewed and updated as appropriate.   Please see review of systems for further details on the patient's review from today.   Objective:   Physical Exam:  There were no vitals taken for this visit.  Physical  Exam Constitutional:      General: He is not in acute distress.    Appearance: He is obese.  Musculoskeletal:        General: No deformity.  Neurological:     Mental Status: He is alert and oriented to person, place, and time.     Cranial  Nerves: No dysarthria.     Motor: Weakness present.     Coordination: Coordination abnormal.     Comments: Shortened gait is better.  No sig tremor.  less Slow but still not normal.  Psychiatric:        Attention and Perception: Attention and perception normal. He does not perceive auditory or visual hallucinations.        Mood and Affect: Mood is anxious. Mood is not depressed. Affect is not labile, blunt, angry or inappropriate.        Speech: Speech normal.        Behavior: Behavior is not slowed. Behavior is cooperative.        Thought Content: Thought content normal. Thought content is not paranoid or delusional. Thought content does not include homicidal or suicidal ideation. Thought content does not include suicidal plan.        Cognition and Memory: Cognition and memory normal.        Judgment: Judgment normal.     Comments: Insight intact Depression 80% better OCD is about the same and not the main problem More expressive and more normal affect.  Quicker responses.    November 06, 2018: [place] Cog test in office within normal limits MMSE 28/30. Animal fluency 17 . (borderline) Taken as a whole, no indication to pursue neuropsychological testing.  Mini-Mental status exam 28/30 on 10/27/20.  No evidence of dementia.  Lab Review:   Vitamin D level acceptable at 54.5.   Normal B12 and folate and TSH in last couple of years..  Echocardiogram is stable re: AVR over the last 8 years and not likely the cause of lethargy.   06/28/23 MRI head:  IMPRESSION: 1. No acute intracranial abnormality. 2. 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 Head CT may be helpful for further assessment of the degree of mineralization. 3. Findings of chronic small vessel ischemia.  SABRAres Assessment: Plan:    Abdelaziz was seen today for follow-up, depression, anxiety, fatigue and add.  Diagnoses and all orders for this visit:  Recurrent major depression resistant to treatment [facility])  Mixed obsessional thoughts and acts  Attention deficit hyperactivity disorder (ADHD), predominantly inattentive type  Hypersomnia with sleep apnea  Mild cognitive impairment  Restless legs syndrome  Low vitamin D level  Erectile disorder, acquired, generalized, moderate     Mr. Tetrick has a long history of depression and OCD.  Major depression with fatigue, cognitive and physical slowness, anhedonia is much worse.  Started Spravato  and improving.  Was better energy with duloxetine  90 vs Lexapro  so increased to 120 mg daily DT worsening depression.    But it was not better. So reduced it back to 90 mg daily.    Now reduced to 60 mg daily to see if ED better now that dep bettter with Spravato. Disc risk worsening dep and anxiety.   He has some compulsive checking and obsessions around the house maintenance.  When travels then tends to have less OCD bc triggered less.    Reduced Spravato to 56 mg bc seemed quite sedated recently with 84 mg .  Not as much at end of session today. The patient experienced the typical dissociation which gradually resolved over the 2-hour period of observation.  There were no complications.  Specifically the patient did not have nausea or vomiting or headache.  Blood pressures remained within normal ranges at the 40-minute and 2-hour follow-up intervals.  By the time the 2-hour observation period was met the patient was alert and oriented and able to exit without assistance.  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  See  nursing note for further details. Emphasized need to relax with Spravato and not return calls, read, return chats, emails etc. Started Spravato 06/25/23.  weekly @ 84 mg with next dose.  His OCD is persistent with checking lites, stove, etc. .  It is better at the moment DT depression being worse.  Disc CBT techniques and potential for more therapy to address. Option Dr. Marijean.  reduce Concerta  to 27  mg AM to reduce SE He wants to use it for depression and cognitive concerns.  Discussed potential benefits, risks, and side effects of stimulants with patient to include increased heart rate, palpitations, insomnia, increased anxiety, increased irritability, or decreased appetite.  Instructed patient to contact office if experiencing any significant tolerability issues.  Extensive discussion of sleep study and he has a copy.  Why is there virtually no N3 & REM sleep?  Is that affecting daytime alertness and fatigue?  Vs how much is related to mild OSA and PLMS?  Disc this in detail.  Wrote correspondence with sleep doc about it.  Options for sleep & fatigue:  Difficult to assess  bc has been [place] of country and then sick for 3 weeks.   Focus on reduction in OSA Focus on improving deep stage sleep.  ? Rx low dose mirtazapine or alternatives Focus on leg movements (hx RLS/PLMS) continue Trial as had been suggested Lyrica  for FM type sx and may help RLS/PLMS.  Better dreaming on 100 mg HS and too sleepy on 150.  Decreased  to 100 mg HS for sleep and back pain and RLS No ropinirole  needed.   Disc SE.  Not having any.   Use LED Xanax  and try to avoid BZ daytime bc fatigue.  He doesn't use it much daythime.  Using 0..25-0.5 mg at night.  May need less with more Lyrica  at [facility] and try to minimize.  No hangover. We discussed the short-term risks associated with benzodiazepines including sedation and increased fall risk among others.  Discussed long-term side effect risk including dependence, potential  withdrawal symptoms, and the potential eventual dose-related risk of dementia.  But recent studies from 2020 dispute this association between benzodiazepines and dementia risk. Newer studies in 2020 do not support an association with dementia.  Stimulant partially successfully used off label to augment antidepressants for depression and have resulted in improved productivity and attention.    Previous screening of memory was not suggestive of any neuro degenerative process: Mini-Mental status exam 28/30 on 10/27/20.   Recent cognition worse as dep worsened.  Also gait is short and slow.   neuro appt.  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  Plan:    duloxetine  60 mg daily to reduce ED  Lyrica  100 mg HS.   Reduce MPH ER to 27 mg AM   Spravato disc in detail.  He wants to continue.  He and wife notice he's better.  Administer now drop back to weekly  He's still improving.   increased back to 84 mg .  He is tolerating it better  Follow-up   Lorene Macintosh MD, DFAPA.  Please see After Visit Summary for patient specific instructions.  Future Appointments  Date Time Provider Department Center  10/15/2023  9:00 AM CP-NURSE [facility] None  10/15/2023  9:00 AM Teresa Redell LABOR, NP [facility] None  10/15/2023  1:30 PM Cottle, Lorene KANDICE Raddle., MD [facility] None  10/22/2023  9:00 AM Cottle, Lorene KANDICE Raddle., MD [facility] None  10/22/2023  9:00 AM CP-NURSE CP-CP None  01/30/2024  3:00 PM Tat, Asberry RAMAN, DO [id number] None  04/02/2024  8:00 AM Gilgannon, Chloe N, PT [facility]     No orders of the defined types were placed in this encounter.      -------------------------------

## 2023-10-08 NOTE — Progress Notes (Signed)
 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 Pt is getting 84 mg again today after receiving 3 of the 56 mg the past 3 weeks due to sedation.  Joaovictor is a patient of Dr. Calhoun so he will follow his care throughout treatments and follow ups. Pt's Spravato is a medical authorization through buy and bill.  Spravato medication is stored at treatment center per REMS/FDA guidelines. The medication is required to be locked behind two doors per REMS/FDA protocol. Medication is also disposed of properly after each use per regulations. All documentation for REMS is completed and submitted per FDA/REMS requirements.          Began taking patient's vital signs at 9:10 AM 129/90, pulse 62, SpO2 95%. Instructed patient to blow his nose if needed then recline back to a 45 degree angle. Gave patient first dose 28 mg nasal spray, administered in each nostril as directed and observed by nurse, waited 5 more minutes for the second and third dose.  After all doses given pt did not complain of any nausea/vomiting. Pt was given water, snacks, and candy.  Assessed his 40 minute vitals, 9:55 AM, 133/82, pulse 71, SpO2 98%. Explained he would be monitored for a total time of 120 minutes. Discharge vitals were taken at 11:01 AM 138/71, P 61, SpO2 96%. Dr. Geoffry came to visit with patient once his thoughts were clearer to discuss how treatment went. Recommend he go home and sleep or just relax on the couch. No driving, no intense activities. Verbalized understanding. Nurse was with pt a total of 60 minutes for clinical assessment. Pt's wife picks him up after treatment. Instructed to call with any issues. Pt will return weekly on Tuesdays.    LOT [id number] EXP JUN 2028

## 2023-10-14 ENCOUNTER — Ambulatory Visit: Admitting: Neurology

## 2023-10-15 ENCOUNTER — Encounter: Admitting: Psychiatry

## 2023-10-15 ENCOUNTER — Ambulatory Visit

## 2023-10-15 ENCOUNTER — Encounter: Admitting: Behavioral Health

## 2023-10-15 ENCOUNTER — Ambulatory Visit (INDEPENDENT_AMBULATORY_CARE_PROVIDER_SITE_OTHER): Admitting: Psychiatry

## 2023-10-15 VITALS — BP 133/74 | HR 63

## 2023-10-15 DIAGNOSIS — F339 Major depressive disorder, recurrent, unspecified: Secondary | ICD-10-CM

## 2023-10-15 DIAGNOSIS — G3184 Mild cognitive impairment, so stated: Secondary | ICD-10-CM

## 2023-10-15 DIAGNOSIS — G8929 Other chronic pain: Secondary | ICD-10-CM

## 2023-10-15 DIAGNOSIS — F900 Attention-deficit hyperactivity disorder, predominantly inattentive type: Secondary | ICD-10-CM

## 2023-10-15 DIAGNOSIS — G4710 Hypersomnia, unspecified: Secondary | ICD-10-CM

## 2023-10-15 DIAGNOSIS — R7989 Other specified abnormal findings of blood chemistry: Secondary | ICD-10-CM

## 2023-10-15 DIAGNOSIS — F422 Mixed obsessional thoughts and acts: Secondary | ICD-10-CM

## 2023-10-15 DIAGNOSIS — G2581 Restless legs syndrome: Secondary | ICD-10-CM

## 2023-10-15 DIAGNOSIS — F5221 Male erectile disorder: Secondary | ICD-10-CM

## 2023-10-15 NOTE — Progress Notes (Unsigned)
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 much bc OCD.  Trying to decide about how to deal with a trigger lately.  OCD driving me crazy wanting to make things perfect.   Other than the trigger had a nice time since here.  GS here for 5 days at 73 yo.  Enjoyed that.   Taking semaglutide   down to 250# from 283#. Card at [facility] says he does not have Aortic aneurysm.  Measuring is OK.  Will FU with MRI in June.   Some diarrhea lately. Cannot stop worrying.  Triggered by the plumbing issue. Meds as above.  No SE of sig.   Plan:  switched to duloxetine  to 90 mg daily.  Off Lexapro .  Others as noted. Lyrica  100 mg HS. Has held Ritalin  10 TID, can resume as long as SBP below 140 per card.  none needed ropinirole  3 mg pm.  No risperidone .  02/18/23 appt noted:  with Eric [name] Meds: as above. Taking Ritalin  30% of night.  Prn Xanax  rarely if gets off sleep cycle. No SE.   Terribly dep but not as bad as in the past. Taking Ritalin  appears to help significantly and started doing that.  Tend to stay in bed till noon but pattern of up late.   OCD about the same with some avoidance of detail work.  Afraid I'll throw away something I need.   She doesn't know whether Ritalin  helps No sig RLS and  wife agrees. Thinks of deceased B at the holidays but worries over son Deward too.   Plan: Increase duloxetine  to 120 mg daily.  Off Lexapro .  Others as noted. Lyrica  100 mg HS.  resumed Ritalin  30 AM with some benefit. no risperidone .  03/19/23 appt noted: Psych med:  duloxetine  120, Ritalin  20 am  missing doses, Lyrica   100 HS, no risperidone , no ropinirole .   No improvement in depression since increase dose duloxetine .  More dep than usual.  Worrying about everything.  Including taxes.  All I can see is a black hole.  Making him feel negative about everything.   Keeping him from doing things.   OCD is not much different from when on Lexapro .  Wife agrees dep and distracted. Plan: resumed Ritalin  30 AM with some benefit. Resume Abilify  2 mg AM for dep.  04/16/23 appt noted:  wife here Psych med:  duloxetine  120, resumed Concerta  36 mg AM, Lyrica  100 HS, Abilify  2, no ropinirole .   Wife noted he seemed manic yesterday.  Invited window estimate against wife's will and without her input.   No mania noted until yesterday. He didn't feel manic yesterday.   He's noticed no change with Abilify  and still feels flat and a little low.  From MPH energy and mood a little better.  Sleeps until 10-11 am about 12 hours. And asleep that whole time. Wife says he doesn't eat much bc he's not awake much. Trouble with hygiene.   Plan: Meds: continue duloxetine  to 120 mg daily.  Off Lexapro .  Lyrica  100 mg HS.  resumed Ritalin  30 AM with some benefit. DC Abiilify Vraylar  1.5 mg every other day Spravato disc in detail.  He wants to pursue if not better with Vraylar .  06/18/23 appt noted: with W Med:  duloxetine  120, resumed Concerta  36 mg AM, Lyrica  100 HS, no ropinirole .   Vraylar  1.5 every other day, no benefit Spravato denied by insurance but is being appealed. More trouble walking and shorter gait.  Neuro in [place] appt in Sept.   Looking to get in in [place].  Can't be much more dep than I am.   OCD  residual when has to make a decision.   ED is more of a px. Reduced voice family. Plan : Spravato  06/25/23 appt noted: with W Med:  duloxetine  120, Concerta  36 mg AM, Lyrica  100 HS, no ropinirole .   Vraylar  1.5 daily No SE Received Spravato 56 mg today.  Experienced very mild dissociation and no sig HA, N.  But some dizziness.  Resolved by end of 2 hours observation.  Able to leave office without assistance.  Ongoing dep without change.  Slow, reduced cognition, anhedonia, forgetful, low motivation. No other concerns with meds.   06/27/23 appt noted:  Med: Med:  duloxetine  120, Concerta  36 mg AM, Lyrica  100 HS, no ropinirole .   Stopped Vraylar  1.5 daily No SE Received Spravato 84 mg todayfor the first time..  Experienced very mild dissociation and no sig HA, N.  But some dizziness.  Resolved by end of 2 hours observation.  Able to leave office without assistance.  Ongoing dep without change.  Slow, reduced cognition, anhedonia, forgetful, low motivation. No other concerns with meds.   07/02/23 appt oted: Med: Med:  duloxetine  90, Concerta  36 mg AM, Lyrica  100 HS, no ropinirole .   Stopped Vraylar  1.5 daily No SE Received Spravato 84 mg today.  Experienced very mild dissociation and no sig HA, N.  But some dizziness.  Resolved by end of 2 hours observation.  Mildly drowsy at end of session. BC of gait px he needed to use WC to leave the office.  Per wife gait gradually worsened over a couple of years but accelerated recently in 3 mos or so.  Short gait.  Not due to pain.  Pending neuro appt.  MRI last week  not read yet. No problems with meds. Wife noticed he's shown more initiative at home since [place] ex doing crossword puzzles.  More engaged.  He feels lighter already with it.  07/15/23 appt noted:  Med: Med:  duloxetine  90, Concerta  36 mg AM, Lyrica  100 HS, no ropinirole .  No SE Received Spravato 84 mg today.  Experienced very mild dissociation and no sig HA, N.  But some dizziness.   Resolved by end of 2 hours observation.  Mildly drowsy at end of session. BC of gait px he needed to use WC to leave the office. Seen with W.   W noted clear benefit Spravato with stronger voice, spontaneous chores he wasn't doing.  More engaged in coverstation.  Pt agrees. Disc concerns over gait px and his neuro visit and MRI scan suggesting Fahr's DZ.  He'll discuss further with DR. Tat tomorrow.  07/18/23 appt noted:  Med: Med:  duloxetine  90, Concerta  36 mg AM, Lyrica  100 HS, no ropinirole .  No SE Received Spravato 84 mg today.  Experienced very mild dissociation and no sig HA, N.  But some dizziness.  Resolved by end of 2 hours observation.  Mildly drowsy at end of session. BC of gait px he needed to use WC to leave the office. No med concerns.  Seen with W.    Both agree dep is better .  More affective range.  More socially engaged.  Quicker thought.  More active .  Less down and less negative.  07/23/23 appt noted: Med: Med:  duloxetine  90, Concerta  36 mg AM, Lyrica  100 HS, no ropinirole .  No SE Received Spravato 84 mg today.  Experienced very mild dissociation and no sig HA, N.  But some dizziness.  Resolved by end of 2 hours observation.  Mildly drowsy at end of session. BC of gait px he needed to use WC to leave the office. No med concerns.  Seen with W.    Dep is still improved.  But is dealing with poor gait, weak and slow.  Will start neurorehab today.   Anxiety re: OCD manageable. Thought and affect quicker and more responsive .  Humor returned.  07/25/23 appt:  Med: Med:  duloxetine  90, Concerta  36 mg AM, Lyrica  100 HS, no ropinirole .  No SE Received Spravato 84 mg today.  Experienced very mild dissociation and no sig HA, N.  But some dizziness.  Resolved by end of 2 hours observation.  Mildly drowsy at end of session. BC of gait px he needed to use WC to leave the office. No med concerns.  Seen with W.    Overall mood still improving but not 100%.  Starting neurorehab for  weakness.  No new med concerns.  Satisfied with meds.  07/30/23 appt noted:  Med: Med:  duloxetine  90, Concerta  36 mg AM, Lyrica  100 HS, no ropinirole .  No SE Received Spravato 84 mg today.  Experienced very mild dissociation and no sig HA, N.  But some dizziness.  Resolved by end of 2 hours observation.  Mildly drowsy at end of session. BC of gait px he needed to use WC to leave the office. No med concerns.  Seen with W.    Both agree mood is improving and activity and interest.  Not 100% but limited by mobility and starting PT.    08/15/23 appt noted:  Med:  duloxetine  90, Concerta  36 mg AM, Lyrica  100 HS, no ropinirole .  No SE Received Spravato 56 mg today.  Experienced very mild  dissociation and no sig HA, N.  But some dizziness.  Resolved by end of 2 hours observation.  Mildly drowsy at end of session.  But less than when on 84 mg. BC of gait px he needed to use WC to leave the office. No med concerns.  Seen with W.    Less drowsy at  end of time here than when got 84 mg daily. They both agree good response with dep and anxiety.  More involved and better interaction socially.  More initiative.  08/20/23 appt noted:  Med:  duloxetine  90, Concerta  36 mg AM, Lyrica  100 HS, no ropinirole .  No SE Received Spravato 56 mg today.  Experienced very mild dissociation and no sig HA, N.  But some dizziness.  Resolved by end of 2 hours observation.  Mildly drowsy at end of session.  But less than when on 84 mg. BC of gait px he needed to use WC to leave the office. No med concerns.  Seen with W.   She's still concerned he sleeps too much.  He's not sure why he does so.  Overall mood, initiative, socialization is better. Less drowsy at  end of time here than when got 84 mg daily.  08/27/23 appt noted:  Med:  duloxetine  90, Concerta  36 mg AM, Lyrica  100 HS, no ropinirole .  No SE Received Spravato 84 mg today.  Experienced very mild dissociation and no sig HA, N.  But some dizziness.  Resolved by end  of 2 hours observation.  Mildly drowsy at end of session.  Walking is some better with PT.  Wife agrees.  He's more interested, responsive, engaged.  Better cognition. After Spravato 56 he does not have to nap when goes home. After 84 mg needed to nap but he wants to increase back to 84 mg to get max effect. More sedate after 84 mg today but is able to clear by end of session.  But wants to continue it.  Over all doing pretty well  with less dep and more engagement and activity including spontaneously cleaning things.   09/10/23 appt noted:  Med:  duloxetine  90, Concerta  36 mg AM, Lyrica  100 HS, no ropinirole .  No SE Received Spravato 84 mg today.  Experienced very mild dissociation and no sig HA, N.  But some dizziness.  Resolved by end of 2 hours observation.  Mildly drowsy at end of session.  Mood continues to improve overall and anxiety better too.  Walking is improving.  Cognition and emotional engagment, activity are all better. Plan no med changes  09/24/23 appt noted: Med: Med:  duloxetine  90, Concerta  36 mg AM, Lyrica  100 HS, no ropinirole .  No SE Received Spravato 84 mg today.  Experienced very mild dissociation and no sig HA, N.  But some dizziness.  Resolved by end of 2 hours observation.  Mildly drowsy at end of session.  Mood continues to improve overall and anxiety better too.  Walking is improving.  Cognition and emotional engagment, activity are all better. Asks about reducing duloxetine  bc ED issues. Plan: reduce duloxetine  to 60 mg daily to reduce ED  10/08/23 appt Med: Med:  duloxetine  60, Concerta  36 mg AM, Lyrica  100 HS, no ropinirole .  Received Spravato 84 mg today.  Experienced very mild dissociation and no sig HA, N.  But some dizziness.  Resolved by end of 2 hours observation.  Mildly drowsy at end of session.  Some dysphoria this week but he and wife continue to see improvement and  benefit with meds and Spravato.   SE concerta  feeling jittery and skips it at times.    ECT-MADRS    Flowsheet Row Office Visit from 06/18/2023 in [facility] Office Visit from 04/16/2023 in [facility]  MADRS Total Score 37 36   [id number]    Flowsheet Row Office Visit from 03/15/2020 in [facility] Total Score 3  PHQ-9 Total Score 9     B schizophrenic SUI. After M's death. PCP Cecil Ee at [facility] at 73 years old.  Prior psychiatric medication trials include  Lexapro  20, citalopram NR, clomipramine weight gain, paroxetine, fluoxetine, Luvox, Trintellix,   Increase Lexapro  back to 20 mg January 2020. & 10/2020 bupropion ,  Auvelity NR  Abilify  10 fatigue,  Cerefolin NAC, and   Naltrexone  sexual SE Lyrica  150 tired  pramipexole,  ropinirole  Adderall XR & IR sexual SE,  Ritalin  30, Concerta  72 mg AM NR modafinil  and Nuvigil,   History [employer] OCD  Review of Systems:  Review of Systems  Constitutional:  Positive for fatigue. Negative for fever.  Cardiovascular:  Positive for palpitations. Negative for chest pain.  Genitourinary:        ED  Musculoskeletal:  Positive for arthralgias, gait problem and myalgias.  Neurological:  Positive for weakness. Negative for syncope.  Psychiatric/Behavioral:  Negative for agitation, behavioral problems, confusion, decreased concentration, dysphoric mood, hallucinations, self-injury, sleep disturbance and suicidal ideas. The patient is nervous/anxious. The patient is not hyperactive.     Medications: I have reviewed the patient's current medications.  Current Outpatient Medications  Medication Sig Dispense Refill  . ALPRAZolam  (XANAX ) 0.25 MG tablet TAKE 1 TABLET (0.25 MG TOTAL) BY MOUTH 2 (TWO) TIMES DAILY AS NEEDED FOR ANXIETY OR SLEEP. 30 tablet 1  . aspirin 81 MG tablet Take 81 mg by mouth daily.    . Cholecalciferol (VITAMIN D-3) 5000 units TABS Take 5,000 Units by mouth daily.      . DULoxetine  (CYMBALTA ) 30 MG capsule Take 3 capsules (90 mg total) by mouth daily. 270 capsule 0  . methylphenidate  (RITALIN ) 10 MG tablet Take 3 tablets (30 mg total) by mouth daily as needed. 60 tablet 0  . methylphenidate  36 MG PO CR tablet Take 1 tablet (36 mg total) by mouth daily. 30 tablet 0  . naltrexone  (DEPADE) 50 MG tablet Take 0.5 tablets (25 mg total) by mouth daily. 45 tablet 1  . pregabalin  (LYRICA ) 50 MG capsule Take 1 capsule (50 mg total) by mouth at bedtime. 90 capsule 0  . simvastatin (ZOCOR) 20 MG tablet Take 20 mg by mouth at bedtime.    . ZEPBOUND  10 MG/0.5ML Pen Inject 10 mg into the skin once a week.     No current facility-administered medications for this visit.    Medication Side Effects: None sexual SE are better not  All gone.  Allergies:  Allergies  Allergen Reactions  . E.E.S. [Erythromycin] Hives  . Macrolides And Ketolides Other (See Comments)    EES   . Rosuvastatin     Other reaction(s): cramps    Past Medical History:  Diagnosis Date  . Allergy   . Anemia    iron- pt denies   . Anxiety   . Aortic cusp regurgitation   . Carotid artery occlusion   . Constipation   . Coronary artery stenosis   . Hyperlipidemia   . Lactose intolerance   . Major depression,  recurrent, chronic   . Obesity   . OCD (obsessive compulsive disorder)   . OSA (obstructive sleep apnea)   . Other chronic pain   . Periodic limb movement disorder   . Periodic limb movements of sleep   . Prediabetes   . Pure hypercholesterolemia   . Restless legs   . Sleep apnea    wears cpap   . Vitamin D deficiency     Family History  Problem Relation Age of Onset  . Cancer Mother        breast and ovarian  . Anxiety disorder Mother   . Breast cancer Mother   . Ovarian cancer Mother   . Depression Father        bi-polar  . Hyperlipidemia Father   . Heart disease Father   . Sudden death Father   . Bipolar disorder Father   . Sleep apnea Father   . Obesity Father    . Depression Son   . Colon cancer Neg Hx   . Colon polyps Neg Hx   . Esophageal cancer Neg Hx   . Rectal cancer Neg Hx   . Stomach cancer Neg Hx     Social History   Socioeconomic History  . Marital status: Married    Spouse name: Not on file  . Number of children: Not on file  . Years of education: Not on file  . Highest education level: Not on file  Occupational History  . Occupation: retired Pensions consultant  Tobacco Use  . Smoking status: Never  . Smokeless tobacco: Never  Substance and Sexual Activity  . Alcohol use: Yes    Comment: occasionally   . Drug use: No  . Sexual activity: Not on file  Other Topics Concern  . Not on file  Social History Narrative  . Not on file   Social Drivers of Health   Financial Resource Strain: Not on file  Food Insecurity: No Food Insecurity (01/25/2022)   Received from [facility] visits prior to 03/24/2022., [facility]   Hunger Vital Sign   . Within the past 12 months, you worried that your food would run out before you got the money to buy more.: Never true   . Within the past 12 months, the food you bought just didn't last and you didn't have money to get more.: Never true  Transportation Needs: No Transportation Needs (01/25/2022)   Received from [facility] visits prior to 03/24/2022.   PRAPARE - Transportation   . Lack of Transportation (Medical): No   . Lack of Transportation (Non-Medical): No  Physical Activity: Not on file  Stress: Not on file  Social Connections: Not on file  Intimate Partner Violence: Not on file    Past Medical History, Surgical history, Social history, and Family history were reviewed and updated as appropriate.   Please see review of systems for further details on the patient's review from today.   Objective:   Physical Exam:  There were no vitals taken for this visit.  Physical Exam Constitutional:      General: He is not in acute  distress.    Appearance: He is obese.  Musculoskeletal:        General: No deformity.  Neurological:     Mental Status: He is alert and oriented to person, place, and time.     Cranial Nerves: No dysarthria.     Motor: Weakness present.     Coordination: Coordination  abnormal.     Comments: Shortened gait is better.  No sig tremor.  less Slow but still not normal.  Psychiatric:        Attention and Perception: Attention and perception normal. He does not perceive auditory or visual hallucinations.        Mood and Affect: Mood is anxious. Mood is not depressed. Affect is not labile, blunt, angry or inappropriate.        Speech: Speech normal.        Behavior: Behavior is not slowed. Behavior is cooperative.        Thought Content: Thought content normal. Thought content is not paranoid or delusional. Thought content does not include homicidal or suicidal ideation. Thought content does not include suicidal plan.        Cognition and Memory: Cognition and memory normal.        Judgment: Judgment normal.     Comments: Insight intact Depression 80% better OCD is about the same and not the main problem More expressive and more normal affect.  Quicker responses.    November 06, 2018: [place] Cog test in office within normal limits MMSE 28/30. Animal fluency 17 . (borderline) Taken as a whole, no indication to pursue neuropsychological testing.  Mini-Mental status exam 28/30 on 10/27/20.  No evidence of dementia.  Lab Review:   Vitamin D level acceptable at 54.5.   Normal B12 and folate and TSH in last couple of years..  Echocardiogram is stable re: AVR over the last 8 years and not likely the cause of lethargy.   06/28/23 MRI head:  IMPRESSION: 1. No acute intracranial abnormality. 2. 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 Head CT may be helpful for  further assessment of the degree of mineralization. 3. Findings of chronic small vessel ischemia.  SABRAres Assessment: Plan:    There are no diagnoses linked to this encounter.    Mr. Luu has a long history of depression and OCD.  Major depression with fatigue, cognitive and physical slowness, anhedonia is much worse.  Started Spravato  and improving.  Was better energy with duloxetine  90 vs Lexapro  so increased to 120 mg daily DT worsening depression.    But it was not better. So reduced it back to 90 mg daily.    Now reduced to 60 mg daily to see if ED better now that dep bettter with Spravato. Disc risk worsening dep and anxiety.   He has some compulsive checking and obsessions around the house maintenance.  When travels then tends to have less OCD bc triggered less.    Reduced Spravato to 56 mg bc seemed quite sedated recently with 84 mg .  Not as much at end of session today. The patient experienced the typical dissociation which gradually resolved over the 2-hour period of observation.  There were no complications.  Specifically the patient did not have nausea or vomiting or headache.  Blood pressures remained within normal ranges at the 40-minute and 2-hour follow-up intervals.  By the time the 2-hour observation period was met the patient was alert and oriented and able to exit without assistance.  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  See nursing note for further details. Emphasized need to relax with Spravato and not return calls, read, return chats, emails etc. Started Spravato 06/25/23.  weekly @ 84 mg with next dose.  His OCD is persistent with checking lites, stove, etc. .  It is better at the moment DT depression being worse.  Disc CBT techniques and potential for more therapy to address. Option Dr. Marijean.  reduce Concerta  to 27  mg AM to reduce SE He wants to use it for  depression and cognitive concerns.  Discussed potential benefits, risks, and side effects of stimulants with patient to include increased heart rate, palpitations, insomnia, increased anxiety, increased irritability, or decreased appetite.  Instructed patient to contact office if experiencing any significant tolerability issues.  Extensive discussion of sleep study and he has a copy.  Why is there virtually no N3 & REM sleep?  Is that affecting daytime alertness and fatigue?  Vs how much is related to mild OSA and PLMS?  Disc this in detail.  Wrote correspondence with sleep doc about it.  Options for sleep & fatigue:  Difficult to assess  bc has been out of country and then sick for 3 weeks.   Focus on reduction in OSA Focus on improving deep stage sleep.  ? Rx low dose mirtazapine or alternatives Focus on leg movements (hx RLS/PLMS) continue Trial as had been suggested Lyrica  for FM type sx and may help RLS/PLMS.  Better dreaming on 100 mg HS and too sleepy on 150.  Decreased  to 100 mg HS for sleep and back pain and RLS No ropinirole  needed.   Disc SE.  Not having any.   Use LED Xanax  and try to avoid BZ daytime bc fatigue.  He doesn't use it much daythime.  Using 0..25-0.5 mg at night.  May need less with more Lyrica  at HS and try to minimize.  No hangover. We discussed the short-term risks associated with benzodiazepines including sedation and increased fall risk among others.  Discussed long-term side effect risk including dependence, potential withdrawal symptoms, and the potential eventual dose-related risk of dementia.  But recent studies from 2020 dispute this association between benzodiazepines and dementia risk. Newer studies in 2020 do not support an association with dementia.  Stimulant partially successfully used off label to augment antidepressants for depression and have resulted in improved productivity and attention.    Previous screening of memory was not suggestive of any  neuro degenerative process: Mini-Mental status exam 28/30 on 10/27/20.   Recent cognition worse as dep worsened.  Also gait is short and slow.   neuro appt.  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  Plan:    duloxetine  60 mg daily to reduce ED  Lyrica  100 mg HS.   Reduce MPH ER to 27 mg AM   Spravato disc in detail.  He wants to continue.  He and wife notice he's better.  Administer now drop back to weekly  He's still improving.   increased back to 84 mg .  He is tolerating it better  Follow-up   Lorene Macintosh MD, DFAPA.  Please see After Visit Summary for patient specific instructions.  Future Appointments  Date Time Provider Department Center  10/22/2023  9:00 AM Cottle, Lorene KANDICE Raddle., MD [facility] None  10/22/2023  9:00 AM CP-NURSE [facility] None  01/30/2024  3:00 PM Tat, Asberry RAMAN, DO [facility] None  04/02/2024  8:00 AM Gilgannon, Chloe N, PT [facility]     No orders of the defined types were placed in this encounter.      -------------------------------

## 2023-10-15 NOTE — Progress Notes (Signed)
 NURSES NOTE:         Pt arrived for his #24 Spravato Treatment for treatment resistant depression, the starting dose was 56 mg (2 of the 28 mg nasal sprays). Pt is getting 84 mg again today after receiving 3 of the 56 mg the past 3 weeks due to sedation.  Zygmunt is a patient of Dr. Calhoun so he will follow his care throughout treatments and follow ups. Pt's Spravato is a medical authorization through buy and bill.  Spravato medication is stored at treatment center per REMS/FDA guidelines. The medication is required to be locked behind two doors per REMS/FDA protocol. Medication is also disposed of properly after each use per regulations. All documentation for REMS is completed and submitted per FDA/REMS requirements.          Began taking patient's vital signs at 9:05 AM 130/67, pulse 60, SpO2 94%. Instructed patient to blow his nose if needed then recline back to a 45 degree angle. Gave patient first dose 28 mg nasal spray, administered in each nostril as directed and observed by nurse, waited 5 more minutes for the second and third dose.  After all doses given pt did not complain of any nausea/vomiting. Pt was given water, snacks, and candy.  Assessed his 40 minute vitals, 9:52 AM, 144/92, pulse 61, SpO2 95%. Explained he would be monitored for a total time of 120 minutes. Discharge vitals were taken at 11:09 AM 133/74, P 63, SpO2 97%. Prior to Dr. Geoffry meeting with pt, [name], pt's wife wanted to discuss some concerns that was happening and she also brought in some supplements pt has been taking. Dr. Geoffry came to visit with patient once his thoughts were clearer to discuss how treatment went. Recommend he go home and sleep or just relax on the couch. No driving, no intense activities. Verbalized understanding. Nurse was with pt a total of 60 minutes for clinical assessment. Pt's wife picks him up after treatment. Instructed to call with any issues. Pt will return weekly on Tuesdays.    LOT  [id number] EXP [facility] 2028

## 2023-10-18 ENCOUNTER — Encounter: Payer: Self-pay | Admitting: Psychiatry

## 2023-10-18 MED ORDER — METHYLPHENIDATE HCL ER (OSM) 27 MG PO TBCR: 27.0000 mg | EXTENDED_RELEASE_TABLET | ORAL | 0 refills | Status: DC

## 2023-10-21 DIAGNOSIS — R3914 Feeling of incomplete bladder emptying: Secondary | ICD-10-CM | POA: Diagnosis not present

## 2023-10-21 DIAGNOSIS — R338 Other retention of urine: Secondary | ICD-10-CM | POA: Diagnosis not present

## 2023-10-21 DIAGNOSIS — N401 Enlarged prostate with lower urinary tract symptoms: Secondary | ICD-10-CM | POA: Diagnosis not present

## 2023-10-21 DIAGNOSIS — N39490 Overflow incontinence: Secondary | ICD-10-CM | POA: Diagnosis not present

## 2023-10-22 ENCOUNTER — Encounter: Payer: Self-pay | Admitting: Psychiatry

## 2023-10-22 ENCOUNTER — Ambulatory Visit (INDEPENDENT_AMBULATORY_CARE_PROVIDER_SITE_OTHER): Admitting: Psychiatry

## 2023-10-22 ENCOUNTER — Other Ambulatory Visit: Payer: Self-pay | Admitting: Psychiatry

## 2023-10-22 ENCOUNTER — Ambulatory Visit

## 2023-10-22 VITALS — BP 131/71 | HR 69

## 2023-10-22 DIAGNOSIS — R7989 Other specified abnormal findings of blood chemistry: Secondary | ICD-10-CM

## 2023-10-22 DIAGNOSIS — G3184 Mild cognitive impairment, so stated: Secondary | ICD-10-CM

## 2023-10-22 DIAGNOSIS — F339 Major depressive disorder, recurrent, unspecified: Secondary | ICD-10-CM | POA: Diagnosis not present

## 2023-10-22 DIAGNOSIS — G2581 Restless legs syndrome: Secondary | ICD-10-CM

## 2023-10-22 DIAGNOSIS — F900 Attention-deficit hyperactivity disorder, predominantly inattentive type: Secondary | ICD-10-CM

## 2023-10-22 DIAGNOSIS — F5221 Male erectile disorder: Secondary | ICD-10-CM

## 2023-10-22 DIAGNOSIS — F422 Mixed obsessional thoughts and acts: Secondary | ICD-10-CM

## 2023-10-22 DIAGNOSIS — G4730 Sleep apnea, unspecified: Secondary | ICD-10-CM

## 2023-10-22 MED ORDER — ALPRAZOLAM 0.25 MG PO TABS: 0.2500 mg | ORAL_TABLET | Freq: Two times a day (BID) | ORAL | 1 refills | Status: DC | PRN

## 2023-10-22 NOTE — Progress Notes (Signed)
 NURSES NOTE:         Pt arrived for his #25 Spravato Treatment for treatment resistant depression, the starting dose was 56 mg (2 of the 28 mg nasal sprays). Pt is getting 84 mg again today after receiving 3 of the 56 mg the past 3 weeks due to sedation.  Eric [name] is a patient of Dr. Calhoun so he will follow his care throughout treatments and follow ups. Pt's Spravato is a medical authorization through buy and bill.  Spravato medication is stored at treatment center per REMS/FDA guidelines. The medication is required to be locked behind two doors per REMS/FDA protocol. Medication is also disposed of properly after each use per regulations. All documentation for REMS is completed and submitted per FDA/REMS requirements.          Began taking patient's vital signs at 9:08 AM 104/85, pulse 71, SpO2 95%. Instructed patient to blow his nose if needed then recline back to a 45 degree angle. Gave patient first dose 28 mg nasal spray, administered in each nostril as directed and observed by nurse, waited 5 more minutes for the second and third dose.  After all doses given pt did not complain of any nausea/vomiting. Pt was given water, snacks, and candy.  Assessed his 40 minute vitals, 9:45 AM, 129/70, pulse 64, SpO2 95%. Explained he would be monitored for a total time of 120 minutes. Discharge vitals were taken at 11:10 AM 131/71, P 69, SpO2 94%. Dr. Geoffry met with pt and his wife, [name] came to discuss his treatment once his thoughts were clearer. Pt is noted with a lower B/P today and he reports having a urinary catheter in until 10/08, due to not being able to empty his bladder. Pt reports not drinking/eating much which could be leading to dehydration. Pt not feeling as strong today physically and very limited with walking. Weakness, fatigue, no energy. Discussed with Dr. Geoffry and advised pt to increase fluid intake. Pt will return twice next week due to going out of town on 11/02/23 for 7 days. Recommend  he go home and sleep or just relax on the couch. No driving, no intense activities. Verbalized understanding. Nurse was with pt a total of 60 minutes for clinical assessment. Pt's wife picks him up after treatment. Instructed to call with any issues.   LOT [id number] EXP [facility] 2028

## 2023-10-22 NOTE — Progress Notes (Signed)
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 Plan  no changes  01/21/23 appt noted: Worrying too much bc OCD.  Trying to decide about how to deal with a trigger lately.  OCD driving me crazy wanting to make things perfect.   Other than the trigger had a nice time since here.  GS here for 5 days at 73 yo.  Enjoyed that.   Taking semaglutide   down to 250# from 283#. Card at [facility] says he does not have Aortic aneurysm.  Measuring is OK.  Will FU with MRI in June.   Some diarrhea lately. Cannot stop worrying.  Triggered by the plumbing issue. Meds as above.  No SE of sig.   Plan:  switched to duloxetine  to 90 mg daily.  Off Lexapro .  Others as noted. Lyrica  100 mg HS. Has held Ritalin  10 TID, can resume as long as SBP below 140 per card.  none needed ropinirole  3 mg pm.  No risperidone .  02/18/23 appt noted:  with Eric [name] Meds: as above. Taking Ritalin  30% of night.  Prn Xanax  rarely if gets off sleep cycle. No SE.   Terribly dep but not as bad as in the past. Taking Ritalin  appears to help significantly and started doing that.  Tend to stay in bed till noon but pattern of up late.   OCD about the same with some avoidance of detail work.   Afraid I'll throw away something I need.   She doesn't know whether Ritalin  helps No sig RLS and wife agrees. Thinks of deceased B at the holidays but worries over son Deward too.   Plan: Increase duloxetine  to 120 mg daily.  Off Lexapro .  Others as noted. Lyrica  100 mg HS.  resumed Ritalin  30 AM with some benefit. no risperidone .  03/19/23 appt noted: Psych med:  duloxetine  120, Ritalin  20 am  missing doses, Lyrica  100 HS, no risperidone , no ropinirole .   No improvement in depression since increase dose duloxetine .  More dep than usual.  Worrying about everything.  Including taxes.  All I can see is a black hole.  Making him feel negative about everything.   Keeping him from doing things.   OCD is not much different from when on Lexapro .  Wife agrees dep and distracted. Plan: resumed Ritalin  30 AM with some benefit. Resume Abilify  2 mg AM for dep.  04/16/23 appt noted:  wife here Psych med:  duloxetine  120, resumed Concerta  36 mg AM, Lyrica  100 HS, Abilify  2, no ropinirole .   Wife noted he seemed manic yesterday.  Invited window estimate against wife's will and without her input.   No mania noted until yesterday. He didn't feel manic yesterday.   He's noticed no change with Abilify  and still feels flat and a little low.  From [facility] energy and mood a little better.  Sleeps until 10-11 am about 12 hours. And asleep that whole time. Wife says he doesn't eat much bc he's not awake much. Trouble with hygiene.   Plan: Meds: continue duloxetine  to 120 mg daily.  Off Lexapro .  Lyrica  100 mg HS.  resumed Ritalin  30 AM with some benefit. DC Abiilify Vraylar  1.5 mg every other day Spravato disc in detail.  He wants to pursue if not better with Vraylar .  06/18/23 appt noted: with W Med:  duloxetine  120, resumed Concerta  36 mg AM, Lyrica  100 HS, no ropinirole .   Vraylar  1.5 every other day, no benefit Spravato denied by insurance but is being appealed. More trouble walking and shorter gait.  Neuro  in [place] appt in Sept.   Looking to get in in [place].  Can't be much more dep than I am.   OCD residual when has to make a decision.   ED is more of a px. Reduced voice family. Plan : Spravato  06/25/23 appt noted: with W Med:  duloxetine  120, Concerta  36 mg AM, Lyrica  100 HS, no ropinirole .   Vraylar  1.5 daily No SE Received Spravato 56 mg today.  Experienced very mild dissociation and no sig HA, N.  But some dizziness.  Resolved by end of 2 hours observation.  Able to leave office without assistance.  Ongoing dep without change.  Slow, reduced cognition, anhedonia, forgetful, low motivation. No other concerns with meds.   06/27/23 appt noted:  Med: Med:  duloxetine  120, Concerta  36 mg AM, Lyrica  100 HS, no ropinirole .   Stopped Vraylar  1.5 daily No SE Received Spravato 84 mg todayfor the first time..  Experienced very mild dissociation and no sig HA, N.  But some dizziness.  Resolved by end of 2 hours observation.  Able to leave office without assistance.  Ongoing dep without change.  Slow, reduced cognition, anhedonia, forgetful, low motivation. No other concerns with meds.   07/02/23 appt oted: Med: Med:  duloxetine  90, Concerta  36 mg AM, Lyrica  100 HS, no ropinirole .   Stopped Vraylar  1.5 daily No SE Received Spravato 84 mg today.  Experienced very mild dissociation and no sig HA, N.  But some dizziness.  Resolved by end of 2 hours observation.  Mildly drowsy at end of session. BC of gait px he needed to use WC to leave the office.  Per wife gait gradually worsened over a couple of years but accelerated recently in 3 mos or so.  Short gait.  Not due to pain.  Pending neuro appt.  MRI last week not read yet. No problems with meds. Wife noticed he's shown more initiative at home since [place] ex doing crossword puzzles.  More engaged.  He feels lighter already with it.  07/15/23 appt noted:  Med: Med:  duloxetine  90, Concerta  36 mg AM, Lyrica  100 HS, no ropinirole .  No SE Received Spravato  84 mg today.  Experienced very mild dissociation and no sig HA, N.  But some dizziness.  Resolved by end of 2 hours observation.  Mildly drowsy at end of session. BC of gait px he needed to use WC to leave the office. Seen with W.   W noted clear benefit Spravato with stronger voice, spontaneous chores he wasn't doing.  More engaged in coverstation.  Pt agrees. Disc concerns over gait px and his neuro visit and MRI scan suggesting Fahr's DZ.  He'll discuss further with DR. Tat tomorrow.  07/18/23 appt noted:  Med: Med:  duloxetine  90, Concerta  36 mg AM, Lyrica  100 HS, no ropinirole .  No SE Received Spravato 84 mg today.  Experienced very mild dissociation and no sig HA, N.  But some dizziness.  Resolved by end of 2 hours observation.  Mildly drowsy at end of session. BC of gait px he needed to use WC to leave the office. No med concerns.  Seen with W.    Both agree dep is better .  More affective range.  More socially engaged.  Quicker thought.  More active .  Less down and less negative.  07/23/23 appt noted: Med: Med:  duloxetine  90, Concerta  36 mg AM, Lyrica  100 HS, no ropinirole .  No SE Received Spravato 84 mg today.  Experienced very mild dissociation and no sig HA, N.  But some dizziness.  Resolved by end of 2 hours observation.  Mildly drowsy at end of session. BC of gait px he needed to use WC to leave the office. No med concerns.  Seen with W.    Dep is still improved.  But is dealing with poor gait, weak and slow.  Will start neurorehab today.   Anxiety re: OCD manageable. Thought and affect quicker and more responsive .  Humor returned.  07/25/23 appt:  Med: Med:  duloxetine  90, Concerta  36 mg AM, Lyrica  100 HS, no ropinirole .  No SE Received Spravato 84 mg today.  Experienced very mild dissociation and no sig HA, N.  But some dizziness.  Resolved by end of 2 hours observation.  Mildly drowsy at end of session. BC of gait px he needed to use WC to leave the office. No med  concerns.  Seen with W.    Overall mood still improving but not 100%.  Starting neurorehab for weakness.  No new med concerns.  Satisfied with meds.  07/30/23 appt noted:  Med: Med:  duloxetine  90, Concerta  36 mg AM, Lyrica  100 HS, no ropinirole .  No SE Received Spravato 84 mg today.  Experienced very mild dissociation and no sig HA, N.  But some dizziness.  Resolved by end of 2 hours observation.  Mildly drowsy at end of session. BC of gait px he needed to use WC to leave the office. No med concerns.  Seen with W.    Both agree mood is improving and activity and interest.  Not 100% but limited by mobility and starting PT.    08/15/23 appt noted:  Med:  duloxetine  90, Concerta  36 mg AM, Lyrica  100  HS, no ropinirole .  No SE Received Spravato 56 mg today.  Experienced very mild dissociation and no sig HA, N.  But some dizziness.  Resolved by end of 2 hours observation.  Mildly drowsy at end of session.  But less than when on 84 mg. BC of gait px he needed to use WC to leave the office. No med concerns.  Seen with W.    Less drowsy at  end of time here than when got 84 mg daily. They both agree good response with dep and anxiety.  More involved and better interaction socially.  More initiative.  08/20/23 appt noted:  Med:  duloxetine  90, Concerta  36 mg AM, Lyrica  100 HS, no ropinirole .  No SE Received Spravato 56 mg today.  Experienced very mild dissociation and no sig HA, N.  But some dizziness.  Resolved by end of 2 hours observation.  Mildly drowsy at end of session.  But less than when on 84 mg. BC of gait px he needed to use WC to leave the office. No med concerns.  Seen with W.   She's still concerned he sleeps too much.  He's not sure why he does so.  Overall mood, initiative, socialization is better. Less drowsy at  end of time here than when got 84 mg daily.  08/27/23 appt noted:  Med:  duloxetine  90, Concerta  36 mg AM, Lyrica  100 HS, no ropinirole .  No SE Received Spravato 84 mg  today.  Experienced very mild dissociation and no sig HA, N.  But some dizziness.  Resolved by end of 2 hours observation.  Mildly drowsy at end of session.  Walking is some better with PT.  Wife agrees.  He's more interested, responsive, engaged.  Better cognition. After Spravato 56 he does not have to nap when goes home. After 84 mg needed to nap but he wants to increase back to 84 mg to get max effect. More sedate after 84 mg today but is able to clear by end of session.  But wants to continue it.  Over all doing pretty well  with less dep and more engagement and activity including spontaneously cleaning things.   09/10/23 appt noted:  Med:  duloxetine  90, Concerta  36 mg AM, Lyrica  100 HS, no ropinirole .  No SE Received Spravato 84 mg today.  Experienced very mild dissociation and no sig HA, N.  But some dizziness.  Resolved by end of 2 hours observation.  Mildly drowsy at end of session.  Mood continues to improve overall and anxiety better too.  Walking is improving.  Cognition and emotional engagment, activity are all better. Plan no med changes  09/24/23 appt noted: Med: Med:  duloxetine  90, Concerta  36 mg AM, Lyrica  100 HS, no ropinirole .  No SE Received Spravato 84 mg today.  Experienced very mild dissociation and no sig HA, N.  But some dizziness.  Resolved by end of 2 hours observation.  Mildly drowsy at end of session.  Mood continues to improve overall and anxiety better too.  Walking is improving.  Cognition and emotional engagment, activity are all better. Asks about reducing duloxetine  bc ED issues. Plan: reduce duloxetine  to 60 mg daily to reduce ED  10/08/23 appt Med: Med:  duloxetine  60, Concerta  36 mg AM, Lyrica  100 HS, no ropinirole .  Received Spravato 84 mg today.  Experienced very mild dissociation and no sig HA, N.  But some dizziness.  Resolved by end of 2 hours observation.  Mildly drowsy at end of  session.  Some dysphoria this week but he and wife continue to see  improvement and benefit with meds and Spravato.   SE concerta  feeling jittery and skips it at times.   10/15/23 appt noted:  Med :  duloxetine  60, Concerta  36 mg AM, Lyrica  100 HS, no ropinirole .  Received Spravato 84 mg today.  Experienced very mild dissociation and no sig HA, N.  But some dizziness.  Resolved by end of 2 hours observation.  Mildly drowsy at end of session.  he and wife continue to see improvement and benefit with meds and Spravato.   Minimal depression now. Stressed by nearly scammed this week by caller, but wife stopped. It.  She is concerned bc in the past he would not have been so easily deceived.  However he says they were just good and he ultimately didn't get scammed.  No concerns with current meds. Plan reduce Concerta  to 27 mg AM  10/22/23 appt noted:  Med :  duloxetine  60, Concerta  36 mg AM, Lyrica  100 HS, no ropinirole .  Received Spravato 84 mg today.  Experienced very mild dissociation and no sig HA, N.  But some dizziness.  Resolved by end of 2 hours observation.  Mildly drowsy at end of session.  he and wife continue to see improvement and benefit with meds and Spravato.   Minimal depression now. Hasn't reduced Concerta  yet.   Is wearing a bladder cath bc U retention.   Suspected prostate px in workup.  Mood is still improving.  Will be here next week then going to see GS in [place] for a week.   ECT-MADRS    Flowsheet Row Office Visit from 06/18/2023 in [facility] Office Visit from 04/16/2023 in [facility]  MADRS Total Score 37 36   [id number]    Flowsheet Row Office Visit from 03/15/2020 in [facility] Total Score 3  PHQ-9 Total Score 9     B schizophrenic SUI. After M's death. PCP Cecil Ee at [place]  [facility] at 73 years old.  Prior psychiatric medication trials include  Lexapro  20, citalopram NR, clomipramine weight gain,  paroxetine, fluoxetine, Luvox, Trintellix,   Increase Lexapro  back to 20 mg January 2020. & 10/2020 bupropion ,  Auvelity NR  Abilify  10 fatigue,  Cerefolin NAC, and   Naltrexone  sexual SE Lyrica  150 tired  pramipexole,  ropinirole  Adderall XR & IR sexual SE,  Ritalin  30, Concerta  72 mg AM NR modafinil  and Nuvigil,   History [employer] OCD  Review of Systems:  Review of Systems  Constitutional:  Positive for fatigue. Negative for fever.  Cardiovascular:  Positive for palpitations. Negative for chest pain.  Genitourinary:  Positive for difficulty urinating.       ED  Musculoskeletal:  Positive for arthralgias, gait problem and myalgias.  Neurological:  Positive for weakness. Negative for dizziness and syncope.  Psychiatric/Behavioral:  Negative for agitation, behavioral problems, confusion, decreased concentration, dysphoric mood, hallucinations, self-injury, sleep disturbance and suicidal ideas. The patient is nervous/anxious. The patient is not hyperactive.     Medications: I have reviewed the patient's current medications.  Current Outpatient Medications  Medication Sig Dispense Refill   ALPRAZolam  (XANAX ) 0.25 MG tablet TAKE 1 TABLET (0.25 MG TOTAL) BY MOUTH 2 (TWO) TIMES DAILY AS NEEDED FOR ANXIETY OR SLEEP. 30 tablet 1   aspirin 81 MG tablet Take 81 mg by mouth daily.     Cholecalciferol (VITAMIN D-3)  5000 units TABS Take 5,000 Units by mouth daily.      DULoxetine  (CYMBALTA ) 30 MG capsule Take 3 capsules (90 mg total) by mouth daily. 270 capsule 0   methylphenidate  (CONCERTA ) 27 MG PO CR tablet Take 1 tablet (27 mg total) by mouth every morning. 30 tablet 0   methylphenidate  (RITALIN ) 10 MG tablet Take 3 tablets (30 mg total) by mouth daily as needed. 60 tablet 0   naltrexone  (DEPADE) 50 MG tablet Take 0.5 tablets (25 mg total) by mouth daily. 45 tablet 1   pregabalin  (LYRICA ) 50 MG capsule Take 1 capsule (50 mg total) by mouth at bedtime. 90 capsule 0   simvastatin  (ZOCOR) 20 MG tablet Take 20 mg by mouth at bedtime.     ZEPBOUND  10 MG/0.5ML Pen Inject 10 mg into the skin once a week.     No current facility-administered medications for this visit.    Medication Side Effects: None sexual SE are better not  All gone.  Allergies:  Allergies  Allergen Reactions   E.E.S. [Erythromycin] Hives   Macrolides And Ketolides Other (See Comments)    EES    Rosuvastatin     Other reaction(s): cramps    Past Medical History:  Diagnosis Date   Allergy    Anemia    iron- pt denies    Anxiety    Aortic cusp regurgitation    Carotid artery occlusion    Constipation    Coronary artery stenosis    Hyperlipidemia    Lactose intolerance    Major depression, recurrent, chronic    Obesity    OCD (obsessive compulsive disorder)    OSA (obstructive sleep apnea)    Other chronic pain    Periodic limb movement disorder    Periodic limb movements of sleep    Prediabetes    Pure hypercholesterolemia    Restless legs    Sleep apnea    wears cpap    Vitamin D deficiency     Family History  Problem Relation Age of Onset   Cancer Mother        breast and ovarian   Anxiety disorder Mother    Breast cancer Mother    Ovarian cancer Mother    Depression Father        bi-polar   Hyperlipidemia Father    Heart disease Father    Sudden death Father    Bipolar disorder Father    Sleep apnea Father    Obesity Father    Depression Son    Colon cancer Neg Hx    Colon polyps Neg Hx    Esophageal cancer Neg Hx    Rectal cancer Neg Hx    Stomach cancer Neg Hx     Social History   Socioeconomic History   Marital status: Married    Spouse name: Not on file   Number of children: Not on file   Years of education: Not on file   Highest education level: Not on file  Occupational History   Occupation: retired Pensions consultant  Tobacco Use   Smoking status: Never   Smokeless tobacco: Never  Substance and Sexual Activity   Alcohol use: Yes    Comment:  occasionally    Drug use: No   Sexual activity: Not on file  Other Topics Concern   Not on file  Social History Narrative   Not on file   Social Drivers of Health   Financial Resource Strain: Not on file  Food Insecurity:  No Food Insecurity (01/25/2022)   Received from [facility] visits prior to 03/24/2022[facility]   Hunger Vital Sign    Within the past 12 months, you worried that your food would run out before you got the money to buy more.: Never true    Within the past 12 months, the food you bought just didn't last and you didn't have money to get more.: Never true  Transportation Needs: No Transportation Needs (01/25/2022)   Received from [facility] visits prior to 03/24/2022.   PRAPARE - Administrator, Civil Service (Medical): No    Lack of Transportation (Non-Medical): No  Physical Activity: Not on file  Stress: Not on file  Social Connections: Not on file  Intimate Partner Violence: Not on file    Past Medical History, Surgical history, Social history, and Family history were reviewed and updated as appropriate.   Please see review of systems for further details on the patient's review from today.   Objective:   Physical Exam:  There were no vitals taken for this visit.  Physical Exam Constitutional:      General: He is not in acute distress.    Appearance: He is obese.  Musculoskeletal:        General: No deformity.  Neurological:     Mental Status: He is alert and oriented to person, place, and time.     Cranial Nerves: No dysarthria.     Motor: Weakness present.     Coordination: Coordination abnormal.     Comments: Shortened gait is better.  No sig tremor.  less Slow but still not normal.  Psychiatric:        Attention and Perception: Attention and perception normal. He does not perceive auditory or visual hallucinations.        Mood and Affect: Mood is anxious. Mood is not depressed.  Affect is not labile, blunt, tearful or inappropriate.        Speech: Speech normal.        Behavior: Behavior is not slowed. Behavior is cooperative.        Thought Content: Thought content normal. Thought content is not paranoid or delusional. Thought content does not include homicidal or suicidal ideation. Thought content does not include suicidal plan.        Cognition and Memory: Cognition and memory normal.        Judgment: Judgment normal.     Comments: Insight intact Depression 80% better OCD is about the same and not the main problem More expressive and more normal affect.  Quicker responses.    November 06, 2018: [place] Cog test in office within normal limits MMSE 28/30. Animal fluency 17 . (borderline) Taken as a whole, no indication to pursue neuropsychological testing.  Mini-Mental status exam 28/30 on 10/27/20.  No evidence of dementia.  Lab Review:   Vitamin D level acceptable at 54.5.   Normal B12 and folate and TSH in last couple of years..  Echocardiogram is stable re: AVR over the last 8 years and not likely the cause of lethargy.   06/28/23 MRI head:  IMPRESSION: 1. No acute intracranial abnormality. 2. 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 Head CT may be helpful for further assessment of the degree of mineralization. 3. Findings of chronic small vessel ischemia.  SABRAres Assessment: Plan:    Montell was seen today for  follow-up, depression, anxiety, fatigue and add.  Diagnoses and all orders for this visit:  Recurrent major depression resistant to treatment  Mixed obsessional thoughts and acts  Attention deficit hyperactivity disorder (ADHD), predominantly inattentive type  Hypersomnia with sleep apnea  Mild cognitive impairment  Restless legs syndrome  Low vitamin D level  Erectile disorder, acquired, generalized,  moderate   Mr. Gopaul has a long history of depression and OCD.  Major depression with fatigue, cognitive and physical slowness, anhedonia is much worse.  Started Spravato  and improving.  Was better energy with duloxetine  90 vs Lexapro  so increased to 120 mg daily DT worsening depression.    But it was not better. So reduced it back to 90 mg daily.    Now reduced to 60 mg daily to see if ED better now that dep bettter with Spravato. Disc risk worsening dep and anxiety.   He has some compulsive checking and obsessions around the house maintenance.  When travels then tends to have less OCD bc triggered less.    Received 84 mg Spravato today. The patient experienced the typical dissociation which gradually resolved over the 2-hour period of observation.  There were no complications.  Specifically the patient did not have nausea or vomiting or headache.  Blood pressures remained within normal ranges at the 40-minute and 2-hour follow-up intervals.  By the time the 2-hour observation period was met the patient was alert and oriented and able to exit without assistance.  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  See nursing note for further details. Emphasized need to relax with Spravato and not return calls, read, return chats, emails etc. Started Spravato 06/25/23.  weekly @ 84 mg with next dose.  His OCD is chronic with checking lites, stove, etc. .  It is better at the moment DT depression being worse.  Disc CBT techniques and potential for more therapy to address. Option Dr. Marijean.  reduce Concerta  to 27  mg AM to reduce SE He wants to use it for depression and cognitive concerns.  Discussed potential benefits, risks, and side effects of stimulants with patient to include increased heart rate, palpitations, insomnia, increased anxiety, increased irritability, or decreased appetite.  Instructed  patient to contact office if experiencing any significant tolerability issues.  Extensive discussion of sleep study and he has a copy.  Why is there virtually no N3 & REM sleep?  Is that affecting daytime alertness and fatigue?  Vs how much is related to mild OSA and PLMS?  Disc this in detail.  Wrote correspondence with sleep doc about it.  Options for sleep & fatigue:  Difficult to assess  bc has been out of country and then sick for 3 weeks.   Focus on reduction in OSA Focus on improving deep stage sleep.  ? Rx low dose mirtazapine or alternatives Focus on leg movements (hx RLS/PLMS) continue Trial as had been suggested Lyrica  for FM type sx and may help RLS/PLMS.  Better dreaming on 100 mg HS and too sleepy on 150.  Decreased  to 100 mg HS for sleep and back pain and RLS No ropinirole  needed.   Disc SE.  Not having any.   Use LED Xanax  and try to avoid BZ daytime bc fatigue.  He doesn't use it much daythime.  Using 0..25-0.5 mg at night.  May need less with more Lyrica  at HS and try to minimize.  No hangover. We  discussed the short-term risks associated with benzodiazepines including sedation and increased fall risk among others.  Discussed long-term side effect risk including dependence, potential withdrawal symptoms, and the potential eventual dose-related risk of dementia.  But recent studies from 2020 dispute this association between benzodiazepines and dementia risk. Newer studies in 2020 do not support an association with dementia.  Stimulant partially successfully used off label to augment antidepressants for depression and have resulted in improved productivity and attention.    Previous screening of memory was not suggestive of any neuro degenerative process: Mini-Mental status exam 28/30 on 10/27/20.   Recent cognition worse as dep worsened.  Also gait is short and slow.   neuro appt.  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  Plan:   no med changed duloxetine  60 mg daily to  reduce ED  Lyrica  100 mg HS.   Reduced [facility]PH ER to 27 mg AM, but he hasn't picked it up yet   Spravato disc in detail.  He wants to continue.  He and wife notice he's better.  Administer now drop back to weekly  He's still improving.   increased back to 84 mg .  He is tolerating it better  Follow-up   Lorene Macintosh MD, DFAPA.  Please see After Visit Summary for patient specific instructions.  Future Appointments  Date Time Provider Department Center  10/29/2023  9:00 AM Cottle, Lorene KANDICE Raddle., MD [facility] None  10/29/2023  9:00 AM CP-NURSE [facility] None  10/31/2023  9:00 AM Cottle, Lorene KANDICE Raddle., MD [facility] None  10/31/2023  9:00 AM CP-NURSE [facility] None  11/12/2023  9:00 AM Cottle, Lorene KANDICE Raddle., MD [facility] None  11/12/2023  9:00 AM CP-NURSE [facility] None  11/19/2023  9:00 AM CP-NURSE [facility] None  11/19/2023  9:00 AM Teresa Rogue A, NP [facility] None  01/30/2024  3:00 PM Tat, Asberry RAMAN, DO [facility] None  04/02/2024  8:00 AM Gilgannon, Chloe N, PT [facility]     No orders of the defined types were placed in this encounter.      -------------------------------

## 2023-10-24 DIAGNOSIS — R531 Weakness: Secondary | ICD-10-CM | POA: Diagnosis not present

## 2023-10-24 DIAGNOSIS — G4710 Hypersomnia, unspecified: Secondary | ICD-10-CM | POA: Diagnosis not present

## 2023-10-24 DIAGNOSIS — G4733 Obstructive sleep apnea (adult) (pediatric): Secondary | ICD-10-CM | POA: Diagnosis not present

## 2023-10-24 DIAGNOSIS — E7800 Pure hypercholesterolemia, unspecified: Secondary | ICD-10-CM | POA: Diagnosis not present

## 2023-10-24 DIAGNOSIS — Z6835 Body mass index (BMI) 35.0-35.9, adult: Secondary | ICD-10-CM | POA: Diagnosis not present

## 2023-10-29 ENCOUNTER — Ambulatory Visit (INDEPENDENT_AMBULATORY_CARE_PROVIDER_SITE_OTHER): Admitting: Psychiatry

## 2023-10-29 ENCOUNTER — Encounter: Payer: Self-pay | Admitting: Psychiatry

## 2023-10-29 ENCOUNTER — Ambulatory Visit

## 2023-10-29 VITALS — BP 125/70 | HR 69

## 2023-10-29 DIAGNOSIS — G2581 Restless legs syndrome: Secondary | ICD-10-CM

## 2023-10-29 DIAGNOSIS — F422 Mixed obsessional thoughts and acts: Secondary | ICD-10-CM

## 2023-10-29 DIAGNOSIS — R7303 Prediabetes: Secondary | ICD-10-CM | POA: Diagnosis not present

## 2023-10-29 DIAGNOSIS — F5221 Male erectile disorder: Secondary | ICD-10-CM

## 2023-10-29 DIAGNOSIS — N3946 Mixed incontinence: Secondary | ICD-10-CM | POA: Diagnosis not present

## 2023-10-29 DIAGNOSIS — R258 Other abnormal involuntary movements: Secondary | ICD-10-CM | POA: Diagnosis not present

## 2023-10-29 DIAGNOSIS — Z79899 Other long term (current) drug therapy: Secondary | ICD-10-CM | POA: Diagnosis not present

## 2023-10-29 DIAGNOSIS — G4730 Sleep apnea, unspecified: Secondary | ICD-10-CM

## 2023-10-29 DIAGNOSIS — F339 Major depressive disorder, recurrent, unspecified: Secondary | ICD-10-CM

## 2023-10-29 DIAGNOSIS — R7989 Other specified abnormal findings of blood chemistry: Secondary | ICD-10-CM

## 2023-10-29 DIAGNOSIS — G3184 Mild cognitive impairment, so stated: Secondary | ICD-10-CM

## 2023-10-29 DIAGNOSIS — F900 Attention-deficit hyperactivity disorder, predominantly inattentive type: Secondary | ICD-10-CM

## 2023-10-29 NOTE — Progress Notes (Signed)
 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 Pt is getting 84 mg again today after receiving 3 of the 56 mg the past 3 weeks due to sedation.  Eric [name] is a patient of Dr. Calhoun so he will follow his care throughout treatments and follow ups. Pt's Spravato is a medical authorization through buy and bill.  Spravato medication is stored at treatment center per REMS/FDA guidelines. The medication is required to be locked behind two doors per REMS/FDA protocol. Medication is also disposed of properly after each use per regulations. All documentation for REMS is completed and submitted per FDA/REMS requirements.          Began taking patient's vital signs at 9:10 AM 130/99, pulse 68, SpO2 95%. Instructed patient to blow his nose if needed then recline back to a 45 degree angle. Gave patient first dose 28 mg nasal spray, administered in each nostril as directed and observed by nurse, waited 5 more minutes for the second and third dose.  After all doses given pt did not complain of any nausea/vomiting. Pt was given water, snacks, and candy. Instructed pt not to contact his bank via internet or phone, to only to listen to music. Assessed his 40 minute vitals, 9:50 AM, 132/68, pulse 70, SpO2 95%. Explained he would be monitored for a total time of 120 minutes. Discharge vitals were taken at 10:58 AM 125/70, P 69, SpO2 95%. Dr. Geoffry met with pt and his wife, [name] came to discuss his treatment once his thoughts were clearer. Recommend he go home and sleep or just relax on the couch. No driving, no intense activities. Verbalized understanding. Nurse was with pt a total of 60 minutes for clinical assessment. Pt's wife picks him up after treatment. Instructed to call with any issues.   LOT [id number] EXP APR 2028

## 2023-10-29 NOTE — Progress Notes (Signed)
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 much bc OCD.  Trying to decide about how to deal with a trigger lately.  OCD driving me crazy wanting to make things perfect.   Other than the trigger had a nice time since here.  GS here for 5 days at 73 yo.  Enjoyed that.   Taking semaglutide   down to 250# from 283#. Card at [facility] says he does not have Aortic aneurysm.  Measuring is OK.  Will FU with MRI in June.   Some diarrhea lately. Cannot stop worrying.  Triggered by the plumbing issue. Meds as above.  No SE of sig.   Plan:  switched to duloxetine  to 90 mg daily.  Off Lexapro .  Others as noted. Lyrica  100 mg HS. Has held Ritalin  10 TID, can resume as long as SBP below 140 per card.  none needed ropinirole  3 mg pm.  No risperidone .  02/18/23 appt noted:  with [name] Meds: as above. Taking Ritalin  30% of night.  Prn Xanax  rarely if gets off sleep cycle. No SE.   Terribly dep but not as bad as in the past. Taking Ritalin  appears to help significantly and started doing that.  Tend to stay in bed till noon but pattern of up late.   OCD about the same with some avoidance of detail work.   Afraid I'll throw away something I need.   She doesn't know whether Ritalin  helps No sig RLS and wife agrees. Thinks of deceased B at the holidays but worries over son Deward too.   Plan: Increase duloxetine  to 120 mg daily.  Off Lexapro .  Others as noted. Lyrica  100 mg HS.  resumed Ritalin  30 AM with some benefit. no risperidone .  03/19/23 appt noted: Psych med:  duloxetine  120, Ritalin  20 am  missing doses, Lyrica  100 HS, no risperidone , no ropinirole .   No improvement in depression since increase dose duloxetine .  More dep than usual.  Worrying about everything.  Including taxes.  All I can see is a black hole.  Making him feel negative about everything.   Keeping him from doing things.   OCD is not much different from when on Lexapro .  Wife agrees dep and distracted. Plan: resumed Ritalin  30 AM with some benefit. Resume Abilify  2 mg AM for dep.  04/16/23 appt noted:  wife here Psych med:  duloxetine  120, resumed Concerta  36 mg AM, Lyrica  100 HS, Abilify  2, no ropinirole .   Wife noted he seemed manic yesterday.  Invited window estimate against wife's will and without her input.   No mania noted until yesterday. He didn't feel manic yesterday.   He's noticed no change with Abilify  and still feels flat and a little low.  From [facility] energy and mood a little better.  Sleeps until 10-11 am about 12 hours. And asleep that whole time. Wife says he doesn't eat much bc he's not awake much. Trouble with hygiene.   Plan: Meds: continue duloxetine  to 120 mg daily.  Off Lexapro .  Lyrica  100 mg HS.  resumed Ritalin  30 AM with some benefit. DC Abiilify Vraylar  1.5 mg every other day Spravato disc in detail.  He wants to pursue if not better with Vraylar .  06/18/23 appt noted: with W Med:  duloxetine  120, resumed Concerta  36 mg AM, Lyrica  100 HS, no ropinirole .   Vraylar  1.5 every other day, no benefit Spravato denied by insurance but is being appealed. More trouble walking and shorter gait.  Neuro  in [place] appt in Sept.   Looking to get in in [place].  Can't be much more dep than I am.   OCD residual when has to make a decision.   ED is more of a px. Reduced voice family. Plan : Spravato  06/25/23 appt noted: with W Med:  duloxetine  120, Concerta  36 mg AM, Lyrica  100 HS, no ropinirole .   Vraylar  1.5 daily No SE Received Spravato 56 mg today.  Experienced very mild dissociation and no sig HA, N.  But some dizziness.  Resolved by end of 2 hours observation.  Able to leave office without assistance.  Ongoing dep without change.  Slow, reduced cognition, anhedonia, forgetful, low motivation. No other concerns with meds.   06/27/23 appt noted:  Med: Med:  duloxetine  120, Concerta  36 mg AM, Lyrica  100 HS, no ropinirole .   Stopped Vraylar  1.5 daily No SE Received Spravato 84 mg todayfor the first time..  Experienced very mild dissociation and no sig HA, N.  But some dizziness.  Resolved by end of 2 hours observation.  Able to leave office without assistance.  Ongoing dep without change.  Slow, reduced cognition, anhedonia, forgetful, low motivation. No other concerns with meds.   07/02/23 appt oted: Med: Med:  duloxetine  90, Concerta  36 mg AM, Lyrica  100 HS, no ropinirole .   Stopped Vraylar  1.5 daily No SE Received Spravato 84 mg today.  Experienced very mild dissociation and no sig HA, N.  But some dizziness.  Resolved by end of 2 hours observation.  Mildly drowsy at end of session. BC of gait px he needed to use WC to leave the office.  Per wife gait gradually worsened over a couple of years but accelerated recently in 3 mos or so.  Short gait.  Not due to pain.  Pending neuro appt.  MRI last week not read yet. No problems with meds. Wife noticed he's shown more initiative at home since Loa ex doing crossword puzzles.  More engaged.  He feels lighter already with it.  07/15/23 appt noted:  Med: Med:  duloxetine  90, Concerta  36 mg AM, Lyrica  100 HS, no ropinirole .  No SE Received Spravato  84 mg today.  Experienced very mild dissociation and no sig HA, N.  But some dizziness.  Resolved by end of 2 hours observation.  Mildly drowsy at end of session. BC of gait px he needed to use WC to leave the office. Seen with W.   W noted clear benefit Spravato with stronger voice, spontaneous chores he wasn't doing.  More engaged in coverstation.  Pt agrees. Disc concerns over gait px and his neuro visit and MRI scan suggesting Fahr's DZ.  He'll discuss further with DR. Tat tomorrow.  07/18/23 appt noted:  Med: Med:  duloxetine  90, Concerta  36 mg AM, Lyrica  100 HS, no ropinirole .  No SE Received Spravato 84 mg today.  Experienced very mild dissociation and no sig HA, N.  But some dizziness.  Resolved by end of 2 hours observation.  Mildly drowsy at end of session. BC of gait px he needed to use WC to leave the office. No med concerns.  Seen with W.    Both agree dep is better .  More affective range.  More socially engaged.  Quicker thought.  More active .  Less down and less negative.  07/23/23 appt noted: Med: Med:  duloxetine  90, Concerta  36 mg AM, Lyrica  100 HS, no ropinirole .  No SE Received Spravato 84 mg today.  Experienced very mild dissociation and no sig HA, N.  But some dizziness.  Resolved by end of 2 hours observation.  Mildly drowsy at end of session. BC of gait px he needed to use WC to leave the office. No med concerns.  Seen with W.    Dep is still improved.  But is dealing with poor gait, weak and slow.  Will start neurorehab today.   Anxiety re: OCD manageable. Thought and affect quicker and more responsive .  Humor returned.  07/25/23 appt:  Med: Med:  duloxetine  90, Concerta  36 mg AM, Lyrica  100 HS, no ropinirole .  No SE Received Spravato 84 mg today.  Experienced very mild dissociation and no sig HA, N.  But some dizziness.  Resolved by end of 2 hours observation.  Mildly drowsy at end of session. BC of gait px he needed to use WC to leave the office. No med  concerns.  Seen with W.    Overall mood still improving but not 100%.  Starting neurorehab for weakness.  No new med concerns.  Satisfied with meds.  07/30/23 appt noted:  Med: Med:  duloxetine  90, Concerta  36 mg AM, Lyrica  100 HS, no ropinirole .  No SE Received Spravato 84 mg today.  Experienced very mild dissociation and no sig HA, N.  But some dizziness.  Resolved by end of 2 hours observation.  Mildly drowsy at end of session. BC of gait px he needed to use WC to leave the office. No med concerns.  Seen with W.    Both agree mood is improving and activity and interest.  Not 100% but limited by mobility and starting PT.    08/15/23 appt noted:  Med:  duloxetine  90, Concerta  36 mg AM, Lyrica  100  HS, no ropinirole .  No SE Received Spravato 56 mg today.  Experienced very mild dissociation and no sig HA, N.  But some dizziness.  Resolved by end of 2 hours observation.  Mildly drowsy at end of session.  But less than when on 84 mg. BC of gait px he needed to use WC to leave the office. No med concerns.  Seen with W.    Less drowsy at  end of time here than when got 84 mg daily. They both agree good response with dep and anxiety.  More involved and better interaction socially.  More initiative.  08/20/23 appt noted:  Med:  duloxetine  90, Concerta  36 mg AM, Lyrica  100 HS, no ropinirole .  No SE Received Spravato 56 mg today.  Experienced very mild dissociation and no sig HA, N.  But some dizziness.  Resolved by end of 2 hours observation.  Mildly drowsy at end of session.  But less than when on 84 mg. BC of gait px he needed to use WC to leave the office. No med concerns.  Seen with W.   She's still concerned he sleeps too much.  He's not sure why he does so.  Overall mood, initiative, socialization is better. Less drowsy at  end of time here than when got 84 mg daily.  08/27/23 appt noted:  Med:  duloxetine  90, Concerta  36 mg AM, Lyrica  100 HS, no ropinirole .  No SE Received Spravato 84 mg  today.  Experienced very mild dissociation and no sig HA, N.  But some dizziness.  Resolved by end of 2 hours observation.  Mildly drowsy at end of session.  Walking is some better with PT.  Wife agrees.  He's more interested, responsive, engaged.  Better cognition. After Spravato 56 he does not have to nap when goes home. After 84 mg needed to nap but he wants to increase back to 84 mg to get max effect. More sedate after 84 mg today but is able to clear by end of session.  But wants to continue it.  Over all doing pretty well  with less dep and more engagement and activity including spontaneously cleaning things.   09/10/23 appt noted:  Med:  duloxetine  90, Concerta  36 mg AM, Lyrica  100 HS, no ropinirole .  No SE Received Spravato 84 mg today.  Experienced very mild dissociation and no sig HA, N.  But some dizziness.  Resolved by end of 2 hours observation.  Mildly drowsy at end of session.  Mood continues to improve overall and anxiety better too.  Walking is improving.  Cognition and emotional engagment, activity are all better. Plan no med changes  09/24/23 appt noted: Med: Med:  duloxetine  90, Concerta  36 mg AM, Lyrica  100 HS, no ropinirole .  No SE Received Spravato 84 mg today.  Experienced very mild dissociation and no sig HA, N.  But some dizziness.  Resolved by end of 2 hours observation.  Mildly drowsy at end of session.  Mood continues to improve overall and anxiety better too.  Walking is improving.  Cognition and emotional engagment, activity are all better. Asks about reducing duloxetine  bc ED issues. Plan: reduce duloxetine  to 60 mg daily to reduce ED  10/08/23 appt Med: Med:  duloxetine  60, Concerta  36 mg AM, Lyrica  100 HS, no ropinirole .  Received Spravato 84 mg today.  Experienced very mild dissociation and no sig HA, N.  But some dizziness.  Resolved by end of 2 hours observation.  Mildly drowsy at end of  session.  Some dysphoria this week but he and wife continue to see  improvement and benefit with meds and Spravato.   SE concerta  feeling jittery and skips it at times.   10/15/23 appt noted:  Med :  duloxetine  60, Concerta  36 mg AM, Lyrica  100 HS, no ropinirole .  Received Spravato 84 mg today.  Experienced very mild dissociation and no sig HA, N.  But some dizziness.  Resolved by end of 2 hours observation.  Mildly drowsy at end of session.  he and wife continue to see improvement and benefit with meds and Spravato.   Minimal depression now. Stressed by nearly scammed this week by caller, but wife stopped. It.  She is concerned bc in the past he would not have been so easily deceived.  However he says they were just good and he ultimately didn't get scammed.  No concerns with current meds. Plan reduce Concerta  to 27 mg AM  10/22/23 appt noted:  Med :  duloxetine  60, Concerta  36 mg AM, Lyrica  100 HS, no ropinirole .  Received Spravato 84 mg today.  Experienced very mild dissociation and no sig HA, N.  But some dizziness.  Resolved by end of 2 hours observation.  Mildly drowsy at end of session.  he and wife continue to see improvement and benefit with meds and Spravato.   Minimal depression now. Hasn't reduced Concerta  yet.   Is wearing a bladder cath bc U retention.   Suspected prostate px in workup.  Mood is still improving.  Will be here next week then going to see GS in [place] for a week. Plan: as noted reduce Concerta  2 AM  10/29/23 appt noted;  Med :  duloxetine  60, Concerta  36 mg AM, Lyrica  100 HS, no ropinirole .  Received Spravato 84 mg today.   Experienced very mild dissociation and no sig HA, N.  But some dizziness.  Resolved by end of 2 hours observation.  Mildly drowsy at end of session.  he and wife continue to see improvement and benefit with meds and Spravato.   Minimal depression now. Hasn't reduced Concerta  yet.   Is wearing a bladder cath bc U retention.   Frustrated over this but gets it out tomorrow. Pretty good with mood. No concerns with  meds.  Intermittent compliance with Concerta .   Wife concerns over poor judgment at times.  Like when makes financial decisions while receiving Spravato.  ECT-MADRS    Flowsheet Row Office Visit from 06/18/2023 in [facility] Office Visit from 04/16/2023 in [facility]  MADRS Total Score 37 36   PHQ2-9    Flowsheet Row Office Visit from 03/15/2020 in [facility] at [facility] Total Score 3  PHQ-9 Total Score 9     B schizophrenic SUI. After M's death. PCP Cecil Ee at [place]  [facility] at 73 years old.  Prior psychiatric medication trials include  Lexapro  20, citalopram NR, clomipramine weight gain, paroxetine, fluoxetine, Luvox, Trintellix,   Increase Lexapro  back to 20 mg January 2020. & 10/2020 bupropion ,  Auvelity NR  Abilify  10 fatigue,  Cerefolin NAC, and   Naltrexone  sexual SE Lyrica  150 tired  pramipexole,  ropinirole  Adderall XR & IR sexual SE,  Ritalin  30, Concerta  72 mg AM NR modafinil  and Nuvigil,   History [employer] OCD  Review of Systems:  Review of Systems  Constitutional:  Positive for fatigue. Negative for fever.  Cardiovascular:  Positive for palpitations. Negative  for chest pain.  Genitourinary:  Positive for difficulty urinating.       ED  Musculoskeletal:  Positive for arthralgias, gait problem and myalgias.  Neurological:  Positive for weakness. Negative for dizziness and syncope.  Psychiatric/Behavioral:  Negative for agitation, behavioral problems, confusion, decreased concentration, dysphoric mood, hallucinations, self-injury, sleep disturbance and suicidal ideas. The patient is nervous/anxious. The patient is not hyperactive.     Medications: I have reviewed the patient's current medications.  Current Outpatient Medications  Medication Sig Dispense Refill   ALPRAZolam  (XANAX ) 0.25 MG tablet Take 1 tablet (0.25 mg total) by mouth  2 (two) times daily as needed for anxiety or sleep. 30 tablet 1   aspirin 81 MG tablet Take 81 mg by mouth daily.     Cholecalciferol (VITAMIN D-3) 5000 units TABS Take 5,000 Units by mouth daily.      DULoxetine  (CYMBALTA ) 30 MG capsule Take 3 capsules (90 mg total) by mouth daily. 270 capsule 0   methylphenidate  (CONCERTA ) 27 MG PO CR tablet Take 1 tablet (27 mg total) by mouth every morning. 30 tablet 0   methylphenidate  (RITALIN ) 10 MG tablet Take 3 tablets (30 mg total) by mouth daily as needed. 60 tablet 0   naltrexone  (DEPADE) 50 MG tablet Take 0.5 tablets (25 mg total) by mouth daily. 45 tablet 1   pregabalin  (LYRICA ) 50 MG capsule Take 1 capsule (50 mg total) by mouth at bedtime. 90 capsule 0   simvastatin (ZOCOR) 20 MG tablet Take 20 mg by mouth at bedtime.     ZEPBOUND  10 MG/0.5ML Pen Inject 10 mg into the skin once a week.     No current facility-administered medications for this visit.    Medication Side Effects: None sexual SE are better not  All gone.  Allergies:  Allergies  Allergen Reactions   E.E.S. [Erythromycin] Hives   Macrolides And Ketolides Other (See Comments)    EES    Rosuvastatin     Other reaction(s): cramps    Past Medical History:  Diagnosis Date   Allergy    Anemia    iron- pt denies    Anxiety    Aortic cusp regurgitation    Carotid artery occlusion    Constipation    Coronary artery stenosis    Hyperlipidemia    Lactose intolerance    Major depression, recurrent, chronic    Obesity    OCD (obsessive compulsive disorder)    OSA (obstructive sleep apnea)    Other chronic pain    Periodic limb movement disorder    Periodic limb movements of sleep    Prediabetes    Pure hypercholesterolemia    Restless legs    Sleep apnea    wears cpap    Vitamin D deficiency     Family History  Problem Relation Age of Onset   Cancer Mother        breast and ovarian   Anxiety disorder Mother    Breast cancer Mother    Ovarian cancer Mother     Depression Father        bi-polar   Hyperlipidemia Father    Heart disease Father    Sudden death Father    Bipolar disorder Father    Sleep apnea Father    Obesity Father    Depression Son    Colon cancer Neg Hx    Colon polyps Neg Hx    Esophageal cancer Neg Hx    Rectal cancer Neg Hx  Stomach cancer Neg Hx     Social History   Socioeconomic History   Marital status: Married    Spouse name: Not on file   Number of children: Not on file   Years of education: Not on file   Highest education level: Not on file  Occupational History   Occupation: retired Pensions consultant  Tobacco Use   Smoking status: Never   Smokeless tobacco: Never  Substance and Sexual Activity   Alcohol use: Yes    Comment: occasionally    Drug use: No   Sexual activity: Not on file  Other Topics Concern   Not on file  Social History Narrative   Not on file   Social Drivers of Health   Financial Resource Strain: Not on file  Food Insecurity: No Food Insecurity (01/25/2022)   Received from [facility] prior to 03/24/2022., [facility]   Hunger Vital Sign    Within the past 12 months, you worried that your food would run out before you got the money to buy more.: Never true    Within the past 12 months, the food you bought just didn't last and you didn't have money to get more.: Never true  Transportation Needs: No Transportation Needs (01/25/2022)   Received from [facility], [facility] visits prior to 03/24/2022.   PRAPARE - Administrator, Civil Service (Medical): No    Lack of Transportation (Non-Medical): No  Physical Activity: Not on file  Stress: Not on file  Social Connections: Not on file  Intimate Partner Violence: Not on file    Past Medical History, Surgical history, Social history, and Family history were reviewed and updated as appropriate.   Please see review of systems for further details on the patient's review from  today.   Objective:   Physical Exam:  There were no vitals taken for this visit.  Physical Exam Constitutional:      General: He is not in acute distress.    Appearance: He is obese.  Musculoskeletal:        General: No deformity.  Neurological:     Mental Status: He is alert and oriented to person, place, and time.     Cranial Nerves: No dysarthria.     Motor: Weakness present.     Coordination: Coordination abnormal.     Comments: Shortened gait is better.  No sig tremor.  less Slow but still not normal.  Psychiatric:        Attention and Perception: Attention and perception normal. He does not perceive auditory or visual hallucinations.        Mood and Affect: Mood is anxious. Mood is not depressed. Affect is not labile, blunt or tearful.        Speech: Speech normal.        Behavior: Behavior is not slowed. Behavior is cooperative.        Thought Content: Thought content normal. Thought content is not paranoid or delusional. Thought content does not include homicidal or suicidal ideation. Thought content does not include suicidal plan.        Cognition and Memory: Cognition and memory normal.        Judgment: Judgment normal.     Comments: Insight intact Depression 80% better OCD is about the same and not the main problem More expressive and more normal affect.  Quicker responses.    November 06, 2018: [place] Cog test in office within normal limits MMSE  28/30. Animal fluency 17 . (borderline) Taken as a whole, no indication to pursue neuropsychological testing.  Mini-Mental status exam 28/30 on 10/27/20.  No evidence of dementia.  Lab Review:   Vitamin D level acceptable at 54.5.   Normal B12 and folate and TSH in last couple of years..  Echocardiogram is stable re: AVR over the last 8 years and not likely the cause of lethargy.   06/28/23 MRI head:  IMPRESSION: 1. No acute intracranial abnormality. 2. 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 Head CT may be helpful for further assessment of the degree of mineralization. 3. Findings of chronic small vessel ischemia.  SABRAres Assessment: Plan:    Eric [name] was seen today for follow-up, depression, anxiety, fatigue and add.  Diagnoses and all orders for this visit:  Recurrent major depression resistant to treatment  Mixed obsessional thoughts and acts  Attention deficit hyperactivity disorder (ADHD), predominantly inattentive type  Hypersomnia with sleep apnea  Mild cognitive impairment  Restless legs syndrome  Low vitamin D level  Erectile disorder, acquired, generalized, moderate   Mr. Marquis has a long history of depression and OCD.  Major depression with fatigue, cognitive and physical slowness, anhedonia is much worse.  Started Spravato  and improving.  Was better energy with duloxetine  90 vs Lexapro  so increased to 120 mg daily DT worsening depression.    But it was not better. So reduced it back to 90 mg daily.    Now reduced to 60 mg daily to see if ED better now that dep bettter with Spravato. Disc risk worsening dep and anxiety.   He has some compulsive checking and obsessions around the house maintenance.  When travels then tends to have less OCD bc triggered less.    Received 84 mg Spravato today. The patient experienced the typical dissociation which gradually resolved over the 2-hour period of observation.  There were no complications.  Specifically the patient did not have nausea or vomiting or headache.  Blood pressures remained within normal ranges at the 40-minute and 2-hour follow-up intervals.  By the time the 2-hour observation period was met the patient was alert and oriented and able to exit without assistance.  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   See nursing note for further details. Emphasized need to relax with Spravato and not return calls, read, return chats, emails etc. Started Spravato 06/25/23. continue weekly @ 84 mg Strongly rec avoid making any decisions nor dealing with any verbal information during the Spravato procedure.  He is not compliant so far with this.  Disc in detail with him and his wife.  His OCD is chronic with checking lites, stove, etc. .  It is better at the moment DT depression being worse.  Disc CBT techniques and potential for more therapy to address. Option Dr. Marijean.  reduce Concerta  to 27  mg AM to reduce SE He wants to use it for depression and cognitive concerns. Consider alternative strategies.    Discussed potential benefits, risks, and side effects of stimulants with patient to include increased heart rate, palpitations, insomnia, increased anxiety, increased irritability, or decreased appetite.  Instructed patient to contact office if experiencing any significant tolerability issues.  Extensive discussion of sleep study and he has a copy.  Why is there virtually no N3 & REM sleep?  Is that affecting daytime alertness and  fatigue?  Vs how much is related to mild OSA and PLMS?  Disc this in detail.  Wrote correspondence with sleep doc about it.  Options for sleep & fatigue:  Difficult to assess  bc has been out of country and then sick for 3 weeks.   Focus on reduction in OSA Focus on improving deep stage sleep.  ? Rx low dose mirtazapine or alternatives Focus on leg movements (hx RLS/PLMS) continue Trial as had been suggested Lyrica  for FM type sx and may help RLS/PLMS.  Better dreaming on 100 mg HS and too sleepy on 150.  Decreased  to 100 mg HS for sleep and back pain and RLS No ropinirole  needed.   Disc SE.  Not having any.   Use LED Xanax  and try to avoid BZ daytime bc fatigue.  He doesn't use it much daythime.  Using 0..25-0.5 mg at night.  May need less with more Lyrica  at HS and try to  minimize.  No hangover. We discussed the short-term risks associated with benzodiazepines including sedation and increased fall risk among others.  Discussed long-term side effect risk including dependence, potential withdrawal symptoms, and the potential eventual dose-related risk of dementia.  But recent studies from 2020 dispute this association between benzodiazepines and dementia risk. Newer studies in 2020 do not support an association with dementia.  Stimulant partially successfully used off label to augment antidepressants for depression and have resulted in improved productivity and attention.    Previous screening of memory was not suggestive of any neuro degenerative process: Mini-Mental status exam 28/30 on 10/27/20.   Recent cognition worse as dep worsened.  Also gait is short and slow.   neuro appt.  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  Plan:  duloxetine  60 mg daily to reduce ED  Lyrica  100 mg HS.   Reduced MPH ER to 27 mg AM, but hasn't taken yet.    Spravato disc in detail.  He wants to continue.  He and wife notice he's better.  Administer now drop back to weekly  He's still improving.   increased back to 84 mg .  He is tolerating it better  Follow-up   Lorene Macintosh MD, DFAPA.  Please see After Visit Summary for patient specific instructions.  Future Appointments  Date Time Provider Department Center  10/31/2023  9:00 AM Cottle, Lorene KANDICE Raddle., MD [facility] None  10/31/2023  9:00 AM CP-NURSE [facility] None  11/12/2023  9:00 AM Cottle, Lorene KANDICE Raddle., MD [facility] None  11/12/2023  9:00 AM CP-NURSE [facility] None  11/19/2023  9:00 AM CP-NURSE [facility] None  11/19/2023  9:00 AM Teresa Rogue A, NP [facility] None  01/30/2024  3:00 PM Tat, Asberry RAMAN, DO [facility] None  04/02/2024  8:00 AM Gilgannon, Chloe N, PT [facility]     No orders of the defined types were placed in this encounter.      -------------------------------

## 2023-10-30 DIAGNOSIS — R339 Retention of urine, unspecified: Secondary | ICD-10-CM | POA: Diagnosis not present

## 2023-10-31 ENCOUNTER — Ambulatory Visit (INDEPENDENT_AMBULATORY_CARE_PROVIDER_SITE_OTHER): Admitting: Psychiatry

## 2023-10-31 ENCOUNTER — Ambulatory Visit

## 2023-10-31 VITALS — BP 151/82 | HR 65

## 2023-10-31 DIAGNOSIS — G2581 Restless legs syndrome: Secondary | ICD-10-CM

## 2023-10-31 DIAGNOSIS — G4710 Hypersomnia, unspecified: Secondary | ICD-10-CM

## 2023-10-31 DIAGNOSIS — G3184 Mild cognitive impairment, so stated: Secondary | ICD-10-CM

## 2023-10-31 DIAGNOSIS — F339 Major depressive disorder, recurrent, unspecified: Secondary | ICD-10-CM

## 2023-10-31 DIAGNOSIS — F422 Mixed obsessional thoughts and acts: Secondary | ICD-10-CM

## 2023-10-31 DIAGNOSIS — F5221 Male erectile disorder: Secondary | ICD-10-CM

## 2023-10-31 DIAGNOSIS — R7989 Other specified abnormal findings of blood chemistry: Secondary | ICD-10-CM

## 2023-10-31 DIAGNOSIS — F900 Attention-deficit hyperactivity disorder, predominantly inattentive type: Secondary | ICD-10-CM

## 2023-10-31 NOTE — Progress Notes (Signed)
 NURSES NOTE:         Pt arrived for his #27 Spravato Treatment for treatment resistant depression, the starting dose was 56 mg (2 of the 28 mg nasal sprays). Pt is getting 84 mg again today after receiving 3 of the 56 mg the past 3 weeks due to sedation.  Eric [name] is a patient of Dr. Calhoun so he will follow his care throughout treatments and follow ups. Pt's Spravato is a medical authorization through buy and bill.  Spravato medication is stored at treatment center per REMS/FDA guidelines. The medication is required to be locked behind two doors per REMS/FDA protocol. Medication is also disposed of properly after each use per regulations. All documentation for [employer]S is completed and submitted per FDA/REMS requirements.          Began taking patient's vital signs at 9:12 AM 145/74, pulse 74, SpO2 95%. Instructed patient to blow his nose if needed then recline back to a 45 degree angle. Gave patient first dose 28 mg nasal spray, administered in each nostril as directed and observed by nurse, waited 5 more minutes for the second and third dose.  After all doses given pt did not complain of any nausea/vomiting. Pt was given water, snacks, and candy. Instructed pt not to contact his bank via internet or phone, to only to listen to music. Assessed his 40 minute vitals, 9:43 AM, 145/76, pulse 64, SpO2 94%. Explained he would be monitored for a total time of 120 minutes. Discharge vitals were taken at 11:05 AM 151/82, P 65, SpO2 97%. Dr. Geoffry met with pt and his wife, [name] came to discuss his treatment once his thoughts were clearer. Pt was able to get his urinary cath our but it had to be reinserted because he was not able to empty his bladder. Recommend he go home and sleep or just relax on the couch. No driving, no intense activities. Verbalized understanding. Nurse was with pt a total of 60 minutes for clinical assessment. Pt's wife picks him up after treatment. Instructed to call with any issues. Pt will  not be back until 2 weeks due to him flying to [place]  to be with his family.   LOT [id number] EXP APR 2028

## 2023-11-08 ENCOUNTER — Encounter: Payer: Self-pay | Admitting: Psychiatry

## 2023-11-08 NOTE — Progress Notes (Signed)
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 Worrying too much bc OCD.  Trying to decide about how to deal with a trigger lately.  OCD driving me crazy wanting to make things perfect.   Other than the trigger had a nice time since here.  GS here for 5 days at 73 yo.  Enjoyed that.   Taking semaglutide   down to 250# from 283#. Card at [facility] says he does not have Aortic aneurysm.  Measuring is OK.  Will FU with MRI in June.   Some diarrhea lately. Cannot stop worrying.  Triggered by the plumbing issue. Meds as above.  No SE of sig.   Plan:  switched to duloxetine  to 90 mg daily.  Off Lexapro .  Others as noted. Lyrica  100 mg HS. Has held Ritalin  10 TID, can resume as long as SBP below 140 per card.  none needed ropinirole  3 mg pm.  No risperidone .  02/18/23 appt noted:  with [name] Meds: as above. Taking Ritalin  30% of night.  Prn Xanax  rarely if gets off sleep cycle. No SE.   Terribly dep but not as bad as in the past. Taking Ritalin  appears to help significantly and started doing that.  Tend to stay in bed till noon but pattern of up late.   OCD about the same with some avoidance of detail work.   Afraid I'll throw away something I need.   She doesn't know whether Ritalin  helps No sig RLS and wife agrees. Thinks of deceased B at the holidays but worries over son Deward too.   Plan: Increase duloxetine  to 120 mg daily.  Off Lexapro .  Others as noted. Lyrica  100 mg HS.  resumed Ritalin  30 AM with some benefit. no risperidone .  03/19/23 appt noted: Psych med:  duloxetine  120, Ritalin  20 am  missing doses, Lyrica  100 HS, no risperidone , no ropinirole .   No improvement in depression since increase dose duloxetine .  More dep than usual.  Worrying about everything.  Including taxes.  All I can see is a black hole.  Making him feel negative about everything.   Keeping him from doing things.   OCD is not much different from when on Lexapro .  Wife agrees dep and distracted. Plan: resumed Ritalin  30 AM with some benefit. Resume Abilify  2 mg AM for dep.  04/16/23 appt noted:  wife here Psych med:  duloxetine  120, resumed Concerta  36 mg AM, Lyrica  100 HS, Abilify  2, no ropinirole .   Wife noted he seemed manic yesterday.  Invited window estimate against wife's will and without her input.   No mania noted until yesterday. He didn't feel manic yesterday.   He's noticed no change with Abilify  and still feels flat and a little low.  From [facility] energy and mood a little better.  Sleeps until 10-11 am about 12 hours. And asleep that whole time. Wife says he doesn't eat much bc he's not awake much. Trouble with hygiene.   Plan: Meds: continue duloxetine  to 120 mg daily.  Off Lexapro .  Lyrica  100 mg HS.  resumed Ritalin  30 AM with some benefit. DC Abiilify Vraylar  1.5 mg every other day Spravato disc in detail.  He wants to pursue if not better with Vraylar .  06/18/23 appt noted: with W Med:  duloxetine  120, resumed Concerta  36 mg AM, Lyrica  100 HS, no ropinirole .   Vraylar  1.5 every other day, no benefit Spravato denied by insurance but is being appealed. More trouble walking and shorter gait.  Neuro  in [place] appt in Sept.   Looking to get in in [place].  Can't be much more dep than I am.   OCD residual when has to make a decision.   ED is more of a px. Reduced voice family. Plan : Spravato  06/25/23 appt noted: with W Med:  duloxetine  120, Concerta  36 mg AM, Lyrica  100 HS, no ropinirole .   Vraylar  1.5 daily No SE Received Spravato 56 mg today.  Experienced very mild dissociation and no sig HA, N.  But some dizziness.  Resolved by end of 2 hours observation.  Able to leave office without assistance.  Ongoing dep without change.  Slow, reduced cognition, anhedonia, forgetful, low motivation. No other concerns with meds.   06/27/23 appt noted:  Med: Med:  duloxetine  120, Concerta  36 mg AM, Lyrica  100 HS, no ropinirole .   Stopped Vraylar  1.5 daily No SE Received Spravato 84 mg todayfor the first time..  Experienced very mild dissociation and no sig HA, N.  But some dizziness.  Resolved by end of 2 hours observation.  Able to leave office without assistance.  Ongoing dep without change.  Slow, reduced cognition, anhedonia, forgetful, low motivation. No other concerns with meds.   07/02/23 appt oted: Med: Med:  duloxetine  90, Concerta  36 mg AM, Lyrica  100 HS, no ropinirole .   Stopped Vraylar  1.5 daily No SE Received Spravato 84 mg today.  Experienced very mild dissociation and no sig HA, N.  But some dizziness.  Resolved by end of 2 hours observation.  Mildly drowsy at end of session. BC of gait px he needed to use WC to leave the office.  Per wife gait gradually worsened over a couple of years but accelerated recently in 3 mos or so.  Short gait.  Not due to pain.  Pending neuro appt.  MRI last week not read yet. No problems with meds. Wife noticed he's shown more initiative at home since [place] ex doing crossword puzzles.  More engaged.  He feels lighter already with it.  07/15/23 appt noted:  Med: Med:  duloxetine  90, Concerta  36 mg AM, Lyrica  100 HS, no ropinirole .  No SE Received Spravato  84 mg today.  Experienced very mild dissociation and no sig HA, N.  But some dizziness.  Resolved by end of 2 hours observation.  Mildly drowsy at end of session. BC of gait px he needed to use WC to leave the office. Seen with W.   W noted clear benefit Spravato with stronger voice, spontaneous chores he wasn't doing.  More engaged in coverstation.  Pt agrees. Disc concerns over gait px and his neuro visit and MRI scan suggesting Fahr's DZ.  He'll discuss further with DR. Tat tomorrow.  07/18/23 appt noted:  Med: Med:  duloxetine  90, Concerta  36 mg AM, Lyrica  100 HS, no ropinirole .  No SE Received Spravato 84 mg today.  Experienced very mild dissociation and no sig HA, N.  But some dizziness.  Resolved by end of 2 hours observation.  Mildly drowsy at end of session. BC of gait px he needed to use WC to leave the office. No med concerns.  Seen with W.    Both agree dep is better .  More affective range.  More socially engaged.  Quicker thought.  More active .  Less down and less negative.  07/23/23 appt noted: Med: Med:  duloxetine  90, Concerta  36 mg AM, Lyrica  100 HS, no ropinirole .  No SE Received Spravato 84 mg today.  Experienced very mild dissociation and no sig HA, N.  But some dizziness.  Resolved by end of 2 hours observation.  Mildly drowsy at end of session. BC of gait px he needed to use WC to leave the office. No med concerns.  Seen with W.    Dep is still improved.  But is dealing with poor gait, weak and slow.  Will start neurorehab today.   Anxiety re: OCD manageable. Thought and affect quicker and more responsive .  Humor returned.  07/25/23 appt:  Med: Med:  duloxetine  90, Concerta  36 mg AM, Lyrica  100 HS, no ropinirole .  No SE Received Spravato 84 mg today.  Experienced very mild dissociation and no sig HA, N.  But some dizziness.  Resolved by end of 2 hours observation.  Mildly drowsy at end of session. BC of gait px he needed to use WC to leave the office. No med  concerns.  Seen with W.    Overall mood still improving but not 100%.  Starting neurorehab for weakness.  No new med concerns.  Satisfied with meds.  07/30/23 appt noted:  Med: Med:  duloxetine  90, Concerta  36 mg AM, Lyrica  100 HS, no ropinirole .  No SE Received Spravato 84 mg today.  Experienced very mild dissociation and no sig HA, N.  But some dizziness.  Resolved by end of 2 hours observation.  Mildly drowsy at end of session. BC of gait px he needed to use WC to leave the office. No med concerns.  Seen with W.    Both agree mood is improving and activity and interest.  Not 100% but limited by mobility and starting PT.    08/15/23 appt noted:  Med:  duloxetine  90, Concerta  36 mg AM, Lyrica  100  HS, no ropinirole .  No SE Received Spravato 56 mg today.  Experienced very mild dissociation and no sig HA, N.  But some dizziness.  Resolved by end of 2 hours observation.  Mildly drowsy at end of session.  But less than when on 84 mg. BC of gait px he needed to use WC to leave the office. No med concerns.  Seen with W.    Less drowsy at  end of time here than when got 84 mg daily. They both agree good response with dep and anxiety.  More involved and better interaction socially.  More initiative.  08/20/23 appt noted:  Med:  duloxetine  90, Concerta  36 mg AM, Lyrica  100 HS, no ropinirole .  No SE Received Spravato 56 mg today.  Experienced very mild dissociation and no sig HA, N.  But some dizziness.  Resolved by end of 2 hours observation.  Mildly drowsy at end of session.  But less than when on 84 mg. BC of gait px he needed to use WC to leave the office. No med concerns.  Seen with W.   She's still concerned he sleeps too much.  He's not sure why he does so.  Overall mood, initiative, socialization is better. Less drowsy at  end of time here than when got 84 mg daily.  08/27/23 appt noted:  Med:  duloxetine  90, Concerta  36 mg AM, Lyrica  100 HS, no ropinirole .  No SE Received Spravato 84 mg  today.  Experienced very mild dissociation and no sig HA, N.  But some dizziness.  Resolved by end of 2 hours observation.  Mildly drowsy at end of session.  Walking is some better with PT.  Wife agrees.  He's more interested, responsive, engaged.  Better cognition. After Spravato 56 he does not have to nap when goes home. After 84 mg needed to nap but he wants to increase back to 84 mg to get max effect. More sedate after 84 mg today but is able to clear by end of session.  But wants to continue it.  Over all doing pretty well  with less dep and more engagement and activity including spontaneously cleaning things.   09/10/23 appt noted:  Med:  duloxetine  90, Concerta  36 mg AM, Lyrica  100 HS, no ropinirole .  No SE Received Spravato 84 mg today.  Experienced very mild dissociation and no sig HA, N.  But some dizziness.  Resolved by end of 2 hours observation.  Mildly drowsy at end of session.  Mood continues to improve overall and anxiety better too.  Walking is improving.  Cognition and emotional engagment, activity are all better. Plan no med changes  09/24/23 appt noted: Med: Med:  duloxetine  90, Concerta  36 mg AM, Lyrica  100 HS, no ropinirole .  No SE Received Spravato 84 mg today.  Experienced very mild dissociation and no sig HA, N.  But some dizziness.  Resolved by end of 2 hours observation.  Mildly drowsy at end of session.  Mood continues to improve overall and anxiety better too.  Walking is improving.  Cognition and emotional engagment, activity are all better. Asks about reducing duloxetine  bc ED issues. Plan: reduce duloxetine  to 60 mg daily to reduce ED  10/08/23 appt Med: Med:  duloxetine  60, Concerta  36 mg AM, Lyrica  100 HS, no ropinirole .  Received Spravato 84 mg today.  Experienced very mild dissociation and no sig HA, N.  But some dizziness.  Resolved by end of 2 hours observation.  Mildly drowsy at end of  session.  Some dysphoria this week but he and wife continue to see  improvement and benefit with meds and Spravato.   SE concerta  feeling jittery and skips it at times.   10/15/23 appt noted:  Med :  duloxetine  60, Concerta  36 mg AM, Lyrica  100 HS, no ropinirole .  Received Spravato 84 mg today.  Experienced very mild dissociation and no sig HA, N.  But some dizziness.  Resolved by end of 2 hours observation.  Mildly drowsy at end of session.  he and wife continue to see improvement and benefit with meds and Spravato.   Minimal depression now. Stressed by nearly scammed this week by caller, but wife stopped. It.  She is concerned bc in the past he would not have been so easily deceived.  However he says they were just good and he ultimately didn't get scammed.  No concerns with current meds. Plan reduce Concerta  to 27 mg AM  10/22/23 appt noted:  Med :  duloxetine  60, Concerta  36 mg AM, Lyrica  100 HS, no ropinirole .  Received Spravato 84 mg today.  Experienced very mild dissociation and no sig HA, N.  But some dizziness.  Resolved by end of 2 hours observation.  Mildly drowsy at end of session.  he and wife continue to see improvement and benefit with meds and Spravato.   Minimal depression now. Hasn't reduced Concerta  yet.   Is wearing a bladder cath bc U retention.   Suspected prostate px in workup.  Mood is still improving.  Will be here next week then going to see GS in [place] for a week. Plan: as noted reduce Concerta  2 AM  10/29/23 appt noted;  Med :  duloxetine  60, Concerta  36 mg AM, Lyrica  100 HS, no ropinirole .  Received Spravato 84 mg today.   Experienced very mild dissociation and no sig HA, N.  But some dizziness.  Resolved by end of 2 hours observation.  Mildly drowsy at end of session.  he and wife continue to see improvement and benefit with meds and Spravato.   Minimal depression now. Hasn't reduced Concerta  yet.   Is wearing a bladder cath bc U retention.   Frustrated over this but gets it out tomorrow. Pretty good with mood. No concerns with  meds.  Intermittent compliance with Concerta .   Wife concerns over poor judgment at times.  Like when makes financial decisions while receiving Spravato.  10/31/23 appt noted:  Med :  duloxetine  60, Concerta  36 mg AM, Lyrica  100 HS, no ropinirole .  Received Spravato 84 mg today.   Experienced very mild dissociation and no sig HA, N.  But some dizziness.  Resolved by end of 2 hours observation.  Mildly drowsy at end of session.  he and wife continue to see improvement and benefit with meds and Spravato.   Minimal depression now. Hasn't reduced Concerta  yet.   Is wearing a bladder cath bc U retention.   Frustrated over this. Mood is better markedly with Spravato.  Not as much OCD lately but distracted by health problems.    ECT-MADRS    Flowsheet Row Office Visit from 06/18/2023 in [facility] Office Visit from 04/16/2023 in [facility]  MADRS Total Score 37 36   PHQ2-9    Flowsheet Row Office Visit from 03/15/2020 in [facility] Total Score 3  PHQ-9 Total Score 9     B schizophrenic SUI. After M's death. PCP  Cecil Ee at [facility] at 73 years old.  Prior psychiatric medication trials include  Lexapro  20, citalopram NR, clomipramine weight gain, paroxetine, fluoxetine, Luvox, Trintellix,   Increase Lexapro  back to 20 mg January 2020. & 10/2020 bupropion ,  Auvelity NR  Abilify  10 fatigue,  Cerefolin NAC, and   Naltrexone  sexual SE Lyrica  150 tired  pramipexole,  ropinirole  Adderall XR & IR sexual SE,  Ritalin  30, Concerta  72 mg AM NR modafinil  and Nuvigil,   History [employer] OCD  Review of Systems:  Review of Systems  Constitutional:  Positive for fatigue. Negative for fever.  Cardiovascular:  Positive for palpitations. Negative for chest pain.  Genitourinary:  Positive for difficulty urinating.       ED  Musculoskeletal:  Positive for  arthralgias, gait problem and myalgias.  Neurological:  Positive for weakness. Negative for dizziness and syncope.  Psychiatric/Behavioral:  Negative for agitation, behavioral problems, confusion, decreased concentration, dysphoric mood, hallucinations, self-injury, sleep disturbance and suicidal ideas. The patient is nervous/anxious. The patient is not hyperactive.     Medications: I have reviewed the patient's current medications.  Current Outpatient Medications  Medication Sig Dispense Refill   ALPRAZolam  (XANAX ) 0.25 MG tablet Take 1 tablet (0.25 mg total) by mouth 2 (two) times daily as needed for anxiety or sleep. 30 tablet 1   aspirin 81 MG tablet Take 81 mg by mouth daily.     Cholecalciferol (VITAMIN D-3) 5000 units TABS Take 5,000 Units by mouth daily.      DULoxetine  (CYMBALTA ) 30 MG capsule Take 3 capsules (90 mg total) by mouth daily. 270 capsule 0   methylphenidate  (CONCERTA ) 27 MG PO CR tablet Take 1 tablet (27 mg total) by mouth every morning. 30 tablet 0   methylphenidate  (RITALIN ) 10 MG tablet Take 3 tablets (30 mg total) by mouth daily as needed. 60 tablet 0   naltrexone  (DEPADE) 50 MG tablet Take 0.5 tablets (25 mg total) by mouth daily. 45 tablet 1   pregabalin  (LYRICA ) 50 MG capsule Take 1 capsule (50 mg total) by mouth at bedtime. 90 capsule 0   simvastatin (ZOCOR) 20 MG tablet Take 20 mg by mouth at bedtime.     ZEPBOUND  10 MG/0.5ML Pen Inject 10 mg into the skin once a week.     No current facility-administered medications for this visit.    Medication Side Effects: None sexual SE are better not  All gone.  Allergies:  Allergies  Allergen Reactions   E.E.S. [Erythromycin] Hives   Macrolides And Ketolides Other (See Comments)    EES    Rosuvastatin     Other reaction(s): cramps    Past Medical History:  Diagnosis Date   Allergy    Anemia    iron- pt denies    Anxiety    Aortic cusp regurgitation    Carotid artery occlusion    Constipation     Coronary artery stenosis    Hyperlipidemia    Lactose intolerance    Major depression, recurrent, chronic    Obesity    OCD (obsessive compulsive disorder)    OSA (obstructive sleep apnea)    Other chronic pain    Periodic limb movement disorder    Periodic limb movements of sleep    Prediabetes    Pure hypercholesterolemia    Restless legs    Sleep apnea    wears cpap    Vitamin D deficiency     Family History  Problem Relation Age of Onset  Cancer Mother        breast and ovarian   Anxiety disorder Mother    Breast cancer Mother    Ovarian cancer Mother    Depression Father        bi-polar   Hyperlipidemia Father    Heart disease Father    Sudden death Father    Bipolar disorder Father    Sleep apnea Father    Obesity Father    Depression Son    Colon cancer Neg Hx    Colon polyps Neg Hx    Esophageal cancer Neg Hx    Rectal cancer Neg Hx    Stomach cancer Neg Hx     Social History   Socioeconomic History   Marital status: Married    Spouse name: Not on file   Number of children: Not on file   Years of education: Not on file   Highest education level: Not on file  Occupational History   Occupation: retired Pensions consultant  Tobacco Use   Smoking status: Never   Smokeless tobacco: Never  Substance and Sexual Activity   Alcohol use: Yes    Comment: occasionally    Drug use: No   Sexual activity: Not on file  Other Topics Concern   Not on file  Social History Narrative   Not on file   Social Drivers of Health   Financial Resource Strain: Not on file  Food Insecurity: No Food Insecurity (01/25/2022)   Received from [facility] visits prior to 03/24/2022., [facility]   Hunger Vital Sign    Within the past 12 months, you worried that your food would run out before you got the money to buy more.: Never true    Within the past 12 months, the food you bought just didn't last and you didn't have money to get more.: Never true   Transportation Needs: No Transportation Needs (01/25/2022)   Received from [facility], [facility] visits prior to 03/24/2022.   PRAPARE - Administrator, Civil Service (Medical): No    Lack of Transportation (Non-Medical): No  Physical Activity: Not on file  Stress: Not on file  Social Connections: Not on file  Intimate Partner Violence: Not on file    Past Medical History, Surgical history, Social history, and Family history were reviewed and updated as appropriate.   Please see review of systems for further details on the patient's review from today.   Objective:   Physical Exam:  There were no vitals taken for this visit.  Physical Exam Constitutional:      General: He is not in acute distress.    Appearance: He is obese.  Musculoskeletal:        General: No deformity.  Neurological:     Mental Status: He is alert and oriented to person, place, and time.     Cranial Nerves: No dysarthria.     Motor: Weakness present.     Coordination: Coordination abnormal.     Comments: Shortened gait is better.  No sig tremor.  less Slow but still not normal.  Psychiatric:        Attention and Perception: Attention and perception normal. He does not perceive auditory or visual hallucinations.        Mood and Affect: Mood is anxious and depressed. Affect is not labile, blunt or tearful.        Speech: Speech normal.        Behavior: Behavior  is not slowed. Behavior is cooperative.        Thought Content: Thought content normal. Thought content is not paranoid or delusional. Thought content does not include homicidal or suicidal ideation. Thought content does not include suicidal plan.        Cognition and Memory: Cognition and memory normal.        Judgment: Judgment normal.     Comments: Insight intact Depression 80% better OCD is about the same and not the main problem More expressive and more normal affect.  Quicker responses.    November 06, 2018: [place] Cog test in office within normal limits MMSE 28/30. Animal fluency 17 . (borderline) Taken as a whole, no indication to pursue neuropsychological testing.  Mini-Mental status exam 28/30 on 10/27/20.  No evidence of dementia.  Lab Review:   Vitamin D level acceptable at 54.5.   Normal B12 and folate and TSH in last couple of years..  Echocardiogram is stable re: AVR over the last 8 years and not likely the cause of lethargy.   06/28/23 MRI head:  IMPRESSION: 1. No acute intracranial abnormality. 2. 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 Head CT may be helpful for further assessment of the degree of mineralization. 3. Findings of chronic small vessel ischemia.  SABRAres Assessment: Plan:    Kamarii was seen today for follow-up, depression, anxiety, fatigue and add.  Diagnoses and all orders for this visit:  Recurrent major depression resistant to treatment  Mixed obsessional thoughts and acts  Attention deficit hyperactivity disorder (ADHD), predominantly inattentive type  Hypersomnia with sleep apnea  Mild cognitive impairment  Restless legs syndrome  Low vitamin D level  Erectile disorder, acquired, generalized, moderate   Mr. Sorlie has a long history of depression and OCD.  Major depression with fatigue, cognitive and physical slowness, anhedonia is much worse.  Started Spravato  and improving.  Was better energy with duloxetine  90 vs Lexapro  so increased to 120 mg daily DT worsening depression.    But it was not better. So reduced it back to 90 mg daily.    Now reduced to 60 mg daily to see if ED better now that dep bettter with Spravato. Disc risk worsening dep and anxiety.   He has some compulsive checking and obsessions around the house maintenance.  When travels then tends to have less OCD bc triggered less.    Received 84  mg Spravato today. The patient experienced the typical dissociation which gradually resolved over the 2-hour period of observation.  There were no complications.  Specifically the patient did not have nausea or vomiting or headache.  Blood pressures remained within normal ranges at the 40-minute and 2-hour follow-up intervals.  By the time the 2-hour observation period was met the patient was alert and oriented and able to exit without assistance.  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  See nursing note for further details. Emphasized need to relax with Spravato and not return calls, read, return chats, emails etc. Started Spravato 06/25/23. continue weekly @ 84 mg Strongly rec avoid making any decisions nor dealing with any verbal information during the Spravato procedure.  He is not compliant so far with this.  Disc in detail with him and his wife.  His OCD is chronic with checking lites, stove, etc. .  It is better at the moment DT  depression being worse.  Disc CBT techniques and potential for more therapy to address. Option Dr. Marijean.  reduce Concerta  to 27  mg AM to reduce SE.  He hasn't started yet. He wants to use it for depression and cognitive concerns. Consider alternative strategies.    Discussed potential benefits, risks, and side effects of stimulants with patient to include increased heart rate, palpitations, insomnia, increased anxiety, increased irritability, or decreased appetite.  Instructed patient to contact office if experiencing any significant tolerability issues.  Extensive discussion of sleep study and he has a copy.  Why is there virtually no N3 & REM sleep?  Is that affecting daytime alertness and fatigue?  Vs how much is related to mild OSA and PLMS?  Disc this in detail.  Wrote correspondence with sleep doc about it.  Options for sleep & fatigue:  Difficult to assess  bc [place]  [place] 3 [place].   Focus on reduction in OSA Focus on improving deep stage sleep.  ? Rx low dose mirtazapine or alternatives Focus on leg movements (hx RLS/PLMS) continue Trial as had been suggested Lyrica  for FM type sx and may help RLS/PLMS.  Better dreaming on 100 mg HS and too sleepy on 150.  Decreased  to 100 mg HS for sleep and back pain and RLS No ropinirole  needed.   Disc SE.  Not having any.   Use LED Xanax  and try to avoid BZ daytime bc fatigue.  He doesn't use it much daythime.  Using 0..25-0.5 mg at night.  May need less with more Lyrica  at [facility] and try to minimize.  No hangover. We discussed the short-term risks associated with benzodiazepines including sedation and increased fall risk among others.  Discussed long-term side effect risk including dependence, potential withdrawal symptoms, and the potential eventual dose-related risk of dementia.  But recent studies from 2020 dispute this association between benzodiazepines and dementia risk. Newer studies in 2020 do not support an association with dementia.  Stimulant partially successfully used off label to augment antidepressants for depression and have resulted in improved productivity and attention.    Previous screening of memory was not suggestive of any neuro degenerative process: Mini-Mental status exam 28/30 on 10/27/20.   Recent cognition worse as dep worsened.  Also gait is short and slow.   neuro appt.  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  Plan:  duloxetine  60 mg daily to reduce ED  Lyrica  100 mg HS.   Reduced MPH ER to 27 mg AM, but hasn't taken yet.    Spravato disc in detail.  He wants to continue.  He and wife notice he's better.  Administer now drop back to weekly  He's still improving.   He is tolerating it better  Follow-up   Lorene Macintosh MD, DFAPA.  Please see After Visit Summary for patient specific instructions.  Future Appointments  Date Time Provider Department  Center  11/12/2023  9:00 AM Cottle, Lorene KANDICE Raddle., MD [facility] None  11/12/2023  9:00 AM CP-NURSE [facility] None  11/14/2023  9:00 AM Cottle, Lorene KANDICE Raddle., MD [facility] None  11/14/2023  9:00 AM CP-NURSE [facility] None  11/19/2023  9:00 AM CP-NURSE [facility] None  11/19/2023  9:00 AM Teresa Rogue A, NP [facility] None  01/30/2024  3:00 PM Tat, Asberry RAMAN, DO [facility] None  04/02/2024  8:00 AM Gilgannon, Chloe N, PT [facility]     No orders of the defined types were placed in this encounter.      -------------------------------

## 2023-11-11 ENCOUNTER — Ambulatory Visit (INDEPENDENT_AMBULATORY_CARE_PROVIDER_SITE_OTHER): Admitting: Psychiatry

## 2023-11-11 ENCOUNTER — Ambulatory Visit

## 2023-11-11 VITALS — BP 136/88 | HR 87

## 2023-11-11 DIAGNOSIS — F422 Mixed obsessional thoughts and acts: Secondary | ICD-10-CM

## 2023-11-11 DIAGNOSIS — G2581 Restless legs syndrome: Secondary | ICD-10-CM

## 2023-11-11 DIAGNOSIS — N401 Enlarged prostate with lower urinary tract symptoms: Secondary | ICD-10-CM | POA: Diagnosis not present

## 2023-11-11 DIAGNOSIS — G4710 Hypersomnia, unspecified: Secondary | ICD-10-CM

## 2023-11-11 DIAGNOSIS — F900 Attention-deficit hyperactivity disorder, predominantly inattentive type: Secondary | ICD-10-CM

## 2023-11-11 DIAGNOSIS — R7989 Other specified abnormal findings of blood chemistry: Secondary | ICD-10-CM

## 2023-11-11 DIAGNOSIS — F339 Major depressive disorder, recurrent, unspecified: Secondary | ICD-10-CM | POA: Diagnosis not present

## 2023-11-11 DIAGNOSIS — R350 Frequency of micturition: Secondary | ICD-10-CM | POA: Diagnosis not present

## 2023-11-11 DIAGNOSIS — F5221 Male erectile disorder: Secondary | ICD-10-CM

## 2023-11-11 DIAGNOSIS — G3184 Mild cognitive impairment, so stated: Secondary | ICD-10-CM

## 2023-11-11 NOTE — Progress Notes (Signed)
 NURSES NOTE:         Pt arrived for his #28 Spravato Treatment for treatment resistant depression, the starting dose was 56 mg (2 of the 28 mg nasal sprays). Pt is getting 84 mg again today after receiving 3 of the 56 mg the past 3 weeks due to sedation.  Eric [name] is a patient of Dr. Calhoun so he will follow his care throughout treatments and follow ups. Pt's Spravato is a medical authorization through buy and bill.  Spravato medication is stored at treatment center per REMS/FDA guidelines. The medication is required to be locked behind two doors per REMS/FDA protocol. Medication is also disposed of properly after each use per regulations. All documentation for REMS is completed and submitted per FDA/REMS requirements.          Began taking patient's vital signs at 3:00 PM 145/78, pulse 80, SpO2 95%. Instructed patient to blow his nose if needed then recline back to a 45 degree angle. Gave patient first dose 28 mg nasal spray, administered in each nostril as directed and observed by nurse, waited 5 more minutes for the second and third dose.  After all doses given pt did not complain of any nausea/vomiting. Pt was given water, snacks, and candy. Instructed pt not to contact his bank via internet or phone, to only to listen to music. Assessed his 40 minute vitals, 4:05 PM, 149/91, pulse 83, SpO2 94%. Explained he would be monitored for a total time of 120 minutes. Discharge vitals were taken at 5:05 PM 136/88, P 81, SpO2 95%. Dr. Geoffry met with pt and his wife, Eric [name] came to discuss his treatment once his thoughts were clearer. Recommend he go home and sleep or just relax on the couch. No driving, no intense activities. Pt reports he likes coming in the PM, offered a change when pt doesn't have other apts. Verbalized understanding. Nurse was with pt a total of 60 minutes for clinical assessment. Pt's wife picks him up after treatment. Instructed to call with any issues. Pt will return next Tuesday.   LOT  [id number] EXP JUN 2028

## 2023-11-12 ENCOUNTER — Encounter

## 2023-11-12 ENCOUNTER — Encounter: Admitting: Psychiatry

## 2023-11-12 DIAGNOSIS — N401 Enlarged prostate with lower urinary tract symptoms: Secondary | ICD-10-CM | POA: Diagnosis not present

## 2023-11-12 DIAGNOSIS — R339 Retention of urine, unspecified: Secondary | ICD-10-CM | POA: Diagnosis not present

## 2023-11-13 ENCOUNTER — Encounter: Payer: Self-pay | Admitting: Psychiatry

## 2023-11-13 DIAGNOSIS — R339 Retention of urine, unspecified: Secondary | ICD-10-CM | POA: Diagnosis not present

## 2023-11-13 NOTE — Progress Notes (Signed)
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 changes  01/21/23 appt noted: Worrying too much bc OCD.  Trying to decide about how to deal with a trigger lately.  OCD driving me crazy wanting to make things perfect.   Other than the trigger had a nice time since here.  GS here for 5 days at 73 yo.  Enjoyed that.   Taking semaglutide   down to 250# from 283#. Card at [facility] says he does not have Aortic aneurysm.  Measuring is OK.  Will FU with MRI in June.   Some diarrhea lately. Cannot stop worrying.  Triggered by the plumbing issue. Meds as above.  No SE of sig.   Plan:  switched to duloxetine  to 90 mg daily.  Off Lexapro .  Others as noted. Lyrica  100 mg HS. Has held Ritalin  10 TID, can resume as long as SBP below 140 per card.  none needed ropinirole  3 mg pm.  No risperidone .  02/18/23 appt noted:  with [name] Meds: as above. Taking Ritalin  30% of night.  Prn Xanax  rarely if gets off sleep cycle. No SE.   Terribly dep but not as bad as in the past. Taking Ritalin  appears to help significantly and started doing that.  Tend to stay in bed till noon but pattern of up late.   OCD about the same with some avoidance of detail work.   Afraid I'll throw away something I need.   She doesn't know whether Ritalin  helps No sig RLS and wife agrees. Thinks of deceased B at the holidays but worries over son Deward too.   Plan: Increase duloxetine  to 120 mg daily.  Off Lexapro .  Others as noted. Lyrica  100 mg HS.  resumed Ritalin  30 AM with some benefit. no risperidone .  03/19/23 appt noted: Psych med:  duloxetine  120, Ritalin  20 am  missing doses, Lyrica  100 HS, no risperidone , no ropinirole .   No improvement in depression since increase dose duloxetine .  More dep than usual.  Worrying about everything.  Including taxes.  All I can see is a black hole.  Making him feel negative about everything.   Keeping him from doing things.   OCD is not much different from when on Lexapro .  Wife agrees dep and distracted. Plan: resumed Ritalin  30 AM with some benefit. Resume Abilify  2 mg AM for dep.  04/16/23 appt noted:  wife here Psych med:  duloxetine  120, resumed Concerta  36 mg AM, Lyrica  100 HS, Abilify  2, no ropinirole .   Wife noted he seemed manic yesterday.  Invited window estimate against wife's will and without her input.   No mania noted until yesterday. He didn't feel manic yesterday.   He's noticed no change with Abilify  and still feels flat and a little low.  From [facility] energy and mood a little better.  Sleeps until 10-11 am about 12 hours. And asleep that whole time. Wife says he doesn't eat much bc he's not awake much. Trouble with hygiene.   Plan: Meds: continue duloxetine  to 120 mg daily.  Off Lexapro .  Lyrica  100 mg HS.  resumed Ritalin  30 AM with some benefit. DC Abiilify Vraylar  1.5 mg every other day Spravato disc in detail.  He wants to pursue if not better with Vraylar .  06/18/23 appt noted: with W Med:  duloxetine  120, resumed Concerta  36 mg AM, Lyrica  100 HS, no ropinirole .   Vraylar  1.5 every other day, no benefit Spravato denied by insurance but is being appealed. More trouble walking and shorter gait.  Neuro  in [place] appt in Sept.   Looking to get in in [place].  Can't be much more dep than I am.   OCD residual when has to make a decision.   ED is more of a px. Reduced voice family. Plan : Spravato  06/25/23 appt noted: with W Med:  duloxetine  120, Concerta  36 mg AM, Lyrica  100 HS, no ropinirole .   Vraylar  1.5 daily No SE Received Spravato 56 mg today.  Experienced very mild dissociation and no sig HA, N.  But some dizziness.  Resolved by end of 2 hours observation.  Able to leave office without assistance.  Ongoing dep without change.  Slow, reduced cognition, anhedonia, forgetful, low motivation. No other concerns with meds.   06/27/23 appt noted:  Med: Med:  duloxetine  120, Concerta  36 mg AM, Lyrica  100 HS, no ropinirole .   Stopped Vraylar  1.5 daily No SE Received Spravato 84 mg todayfor the first time..  Experienced very mild dissociation and no sig HA, N.  But some dizziness.  Resolved by end of 2 hours observation.  Able to leave office without assistance.  Ongoing dep without change.  Slow, reduced cognition, anhedonia, forgetful, low motivation. No other concerns with meds.   07/02/23 appt oted: Med: Med:  duloxetine  90, Concerta  36 mg AM, Lyrica  100 HS, no ropinirole .   Stopped Vraylar  1.5 daily No SE Received Spravato 84 mg today.  Experienced very mild dissociation and no sig HA, N.  But some dizziness.  Resolved by end of 2 hours observation.  Mildly drowsy at end of session. BC of gait px he needed to use WC to leave the office.  Per wife gait gradually worsened over a couple of years but accelerated recently in 3 mos or so.  Short gait.  Not due to pain.  Pending neuro appt.  MRI last week not read yet. No problems with meds. Wife noticed he's shown more initiative at home since [place] ex doing crossword puzzles.  More engaged.  He feels lighter already with it.  07/15/23 appt noted:  Med: Med:  duloxetine  90, Concerta  36 mg AM, Lyrica  100 HS, no ropinirole .  No SE Received Spravato  84 mg today.  Experienced very mild dissociation and no sig HA, N.  But some dizziness.  Resolved by end of 2 hours observation.  Mildly drowsy at end of session. BC of gait px he needed to use WC to leave the office. Seen with W.   W noted clear benefit Spravato with stronger voice, spontaneous chores he wasn't doing.  More engaged in coverstation.  Pt agrees. Disc concerns over gait px and his neuro visit and MRI scan suggesting Fahr's DZ.  He'll discuss further with DR. Tat tomorrow.  07/18/23 appt noted:  Med: Med:  duloxetine  90, Concerta  36 mg AM, Lyrica  100 HS, no ropinirole .  No SE Received Spravato 84 mg today.  Experienced very mild dissociation and no sig HA, N.  But some dizziness.  Resolved by end of 2 hours observation.  Mildly drowsy at end of session. BC of gait px he needed to use WC to leave the office. No med concerns.  Seen with W.    Both agree dep is better .  More affective range.  More socially engaged.  Quicker thought.  More active .  Less down and less negative.  07/23/23 appt noted: Med: Med:  duloxetine  90, Concerta  36 mg AM, Lyrica  100 HS, no ropinirole .  No SE Received Spravato 84 mg today.  Experienced very mild dissociation and no sig HA, N.  But some dizziness.  Resolved by end of 2 hours observation.  Mildly drowsy at end of session. BC of gait px he needed to use WC to leave the office. No med concerns.  Seen with W.    Dep is still improved.  But is dealing with poor gait, weak and slow.  Will start neurorehab today.   Anxiety re: OCD manageable. Thought and affect quicker and more responsive .  Humor returned.  07/25/23 appt:  Med: Med:  duloxetine  90, Concerta  36 mg AM, Lyrica  100 HS, no ropinirole .  No SE Received Spravato 84 mg today.  Experienced very mild dissociation and no sig HA, N.  But some dizziness.  Resolved by end of 2 hours observation.  Mildly drowsy at end of session. BC of gait px he needed to use WC to leave the office. No med  concerns.  Seen with W.    Overall mood still improving but not 100%.  Starting neurorehab for weakness.  No new med concerns.  Satisfied with meds.  07/30/23 appt noted:  Med: Med:  duloxetine  90, Concerta  36 mg AM, Lyrica  100 HS, no ropinirole .  No SE Received Spravato 84 mg today.  Experienced very mild dissociation and no sig HA, N.  But some dizziness.  Resolved by end of 2 hours observation.  Mildly drowsy at end of session. BC of gait px he needed to use WC to leave the office. No med concerns.  Seen with W.    Both agree mood is improving and activity and interest.  Not 100% but limited by mobility and starting PT.    08/15/23 appt noted:  Med:  duloxetine  90, Concerta  36 mg AM, Lyrica  100  HS, no ropinirole .  No SE Received Spravato 56 mg today.  Experienced very mild dissociation and no sig HA, N.  But some dizziness.  Resolved by end of 2 hours observation.  Mildly drowsy at end of session.  But less than when on 84 mg. BC of gait px he needed to use WC to leave the office. No med concerns.  Seen with W.    Less drowsy at  end of time here than when got 84 mg daily. They both agree good response with dep and anxiety.  More involved and better interaction socially.  More initiative.  08/20/23 appt noted:  Med:  duloxetine  90, Concerta  36 mg AM, Lyrica  100 HS, no ropinirole .  No SE Received Spravato 56 mg today.  Experienced very mild dissociation and no sig HA, N.  But some dizziness.  Resolved by end of 2 hours observation.  Mildly drowsy at end of session.  But less than when on 84 mg. BC of gait px he needed to use WC to leave the office. No med concerns.  Seen with W.   She's still concerned he sleeps too much.  He's not sure why he does so.  Overall mood, initiative, socialization is better. Less drowsy at  end of time here than when got 84 mg daily.  08/27/23 appt noted:  Med:  duloxetine  90, Concerta  36 mg AM, Lyrica  100 HS, no ropinirole .  No SE Received Spravato 84 mg  today.  Experienced very mild dissociation and no sig HA, N.  But some dizziness.  Resolved by end of 2 hours observation.  Mildly drowsy at end of session.  Walking is some better with PT.  Wife agrees.  He's more interested, responsive, engaged.  Better cognition. After Spravato 56 he does not have to nap when goes home. After 84 mg needed to nap but he wants to increase back to 84 mg to get max effect. More sedate after 84 mg today but is able to clear by end of session.  But wants to continue it.  Over all doing pretty well  with less dep and more engagement and activity including spontaneously cleaning things.   09/10/23 appt noted:  Med:  duloxetine  90, Concerta  36 mg AM, Lyrica  100 HS, no ropinirole .  No SE Received Spravato 84 mg today.  Experienced very mild dissociation and no sig HA, N.  But some dizziness.  Resolved by end of 2 hours observation.  Mildly drowsy at end of session.  Mood continues to improve overall and anxiety better too.  Walking is improving.  Cognition and emotional engagment, activity are all better. Plan no med changes  09/24/23 appt noted: Med: Med:  duloxetine  90, Concerta  36 mg AM, Lyrica  100 HS, no ropinirole .  No SE Received Spravato 84 mg today.  Experienced very mild dissociation and no sig HA, N.  But some dizziness.  Resolved by end of 2 hours observation.  Mildly drowsy at end of session.  Mood continues to improve overall and anxiety better too.  Walking is improving.  Cognition and emotional engagment, activity are all better. Asks about reducing duloxetine  bc ED issues. Plan: reduce duloxetine  to 60 mg daily to reduce ED  10/08/23 appt Med: Med:  duloxetine  60, Concerta  36 mg AM, Lyrica  100 HS, no ropinirole .  Received Spravato 84 mg today.  Experienced very mild dissociation and no sig HA, N.  But some dizziness.  Resolved by end of 2 hours observation.  Mildly drowsy at end of  session.  Some dysphoria this week but he and wife continue to see  improvement and benefit with meds and Spravato.   SE concerta  feeling jittery and skips it at times.   10/15/23 appt noted:  Med :  duloxetine  60, Concerta  36 mg AM, Lyrica  100 HS, no ropinirole .  Received Spravato 84 mg today.  Experienced very mild dissociation and no sig HA, N.  But some dizziness.  Resolved by end of 2 hours observation.  Mildly drowsy at end of session.  he and wife continue to see improvement and benefit with meds and Spravato.   Minimal depression now. Stressed by nearly scammed this week by caller, but wife stopped. It.  She is concerned bc in the past he would not have been so easily deceived.  However he says they were just good and he ultimately didn't get scammed.  No concerns with current meds. Plan reduce Concerta  to 27 mg AM  10/22/23 appt noted:  Med :  duloxetine  60, Concerta  36 mg AM, Lyrica  100 HS, no ropinirole .  Received Spravato 84 mg today.  Experienced very mild dissociation and no sig HA, N.  But some dizziness.  Resolved by end of 2 hours observation.  Mildly drowsy at end of session.  he and wife continue to see improvement and benefit with meds and Spravato.   Minimal depression now. Hasn't reduced Concerta  yet.   Is wearing a bladder cath bc U retention.   Suspected prostate px in workup.  Mood is still improving.  Will be here next week then going to see GS in [place] for a week. Plan: as noted reduce Concerta  2 AM  10/29/23 appt noted;  Med :  duloxetine  60, Concerta  36 mg AM, Lyrica  100 HS, no ropinirole .  Received Spravato 84 mg today.   Experienced very mild dissociation and no sig HA, N.  But some dizziness.  Resolved by end of 2 hours observation.  Mildly drowsy at end of session.  he and wife continue to see improvement and benefit with meds and Spravato.   Minimal depression now. Hasn't reduced Concerta  yet.   Is wearing a bladder cath bc U retention.   Frustrated over this but gets it out tomorrow. Pretty good with mood. No concerns with  meds.  Intermittent compliance with Concerta .   Wife concerns over poor judgment at times.  Like when makes financial decisions while receiving Spravato.  10/31/23 appt noted:  Med :  duloxetine  60, Concerta  36 mg AM, Lyrica  100 HS, no ropinirole .  Received Spravato 84 mg today.   Experienced very mild dissociation and no sig HA, N.  But some dizziness.  Resolved by end of 2 hours observation.  Mildly drowsy at end of session.  he and wife continue to see improvement and benefit with meds and Spravato.   Minimal depression now. Hasn't reduced Concerta  yet.   Is wearing a bladder cath bc U retention.   Frustrated over this. Mood is better markedly with Spravato.  Not as much OCD lately but distracted by health problems.    11/11/23 appt noted:  Med :  duloxetine  60, Concerta  36 mg AM, Lyrica  100 HS, no ropinirole .  Received Spravato 84 mg today.   Experienced very mild dissociation and no sig HA, N.  But some dizziness.  Resolved by end of 2 hours observation.  Mildly drowsy at end of session.  he and wife continue to see improvement and benefit with meds and Spravato.   Minimal depression now. Trip  to [place] to visit family went well sitting with GS.  Missed a Spravato admin.   Dep remained manageable generally.  Wife indicated he was negative this am but he feels better after Spravato.  Disc concerns abotu gait gradually better.  OCD is better.    ECT-MADRS    Flowsheet Row Office Visit from 06/18/2023 in [facility] Office Visit from 04/16/2023 in [facility]  MADRS Total Score 37 36   [id number]    Flowsheet Row Office Visit from 03/15/2020 in [facility] Total Score 3  PHQ-9 Total Score 9     B schizophrenic SUI. After M's death. PCP Cecil Ee at [facility] at 73 years old.  Prior psychiatric medication trials include  Lexapro  20, citalopram NR,  clomipramine weight gain, paroxetine, fluoxetine, Luvox, Trintellix,   Increase Lexapro  back to 20 mg January 2020. & 10/2020 bupropion ,  Auvelity NR  Abilify  10 fatigue,  Cerefolin NAC, and   Naltrexone  sexual SE Lyrica  150 tired  pramipexole,  ropinirole  Adderall XR & IR sexual SE,  Ritalin  30, Concerta  72 mg AM NR modafinil  and Nuvigil,   History [employer] OCD  Review of Systems:  Review of Systems  Constitutional:  Positive for fatigue. Negative for fever.  Cardiovascular:  Positive for palpitations. Negative for chest pain.  Genitourinary:  Positive for difficulty urinating.       ED  Musculoskeletal:  Positive for arthralgias, gait problem and myalgias.  Neurological:  Positive for weakness. Negative for dizziness and syncope.  Psychiatric/Behavioral:  Negative for agitation, behavioral problems, confusion, decreased concentration, dysphoric mood, hallucinations, self-injury, sleep disturbance and suicidal ideas. The patient is nervous/anxious. The patient is not hyperactive.     Medications: I have reviewed the patient's current medications.  Current Outpatient Medications  Medication Sig Dispense Refill   ALPRAZolam  (XANAX ) 0.25 MG tablet Take 1 tablet (0.25 mg total) by mouth 2 (two) times daily as needed for anxiety or sleep. 30 tablet 1   aspirin 81 MG tablet Take 81 mg by mouth daily.     Cholecalciferol (VITAMIN D-3) 5000 units TABS Take 5,000 Units by mouth daily.      DULoxetine  (CYMBALTA ) 30 MG capsule Take 3 capsules (90 mg total) by mouth daily. 270 capsule 0   methylphenidate  (CONCERTA ) 27 MG PO CR tablet Take 1 tablet (27 mg total) by mouth every morning. 30 tablet 0   methylphenidate  (RITALIN ) 10 MG tablet Take 3 tablets (30 mg total) by mouth daily as needed. 60 tablet 0   naltrexone  (DEPADE) 50 MG tablet Take 0.5 tablets (25 mg total) by mouth daily. 45 tablet 1   pregabalin  (LYRICA ) 50 MG capsule Take 1 capsule (50 mg total) by mouth at bedtime. 90  capsule 0   simvastatin (ZOCOR) 20 MG tablet Take 20 mg by mouth at bedtime.     ZEPBOUND  10 MG/0.5ML Pen Inject 10 mg into the skin once a week.     No current facility-administered medications for this visit.    Medication Side Effects: None sexual SE are better not  All gone.  Allergies:  Allergies  Allergen Reactions   E.E.S. [Erythromycin] Hives   Macrolides And Ketolides Other (See Comments)    EES    Rosuvastatin     Other reaction(s): cramps    Past Medical History:  Diagnosis Date   Allergy    Anemia    iron- pt denies  Anxiety    Aortic cusp regurgitation    Carotid artery occlusion    Constipation    Coronary artery stenosis    Hyperlipidemia    Lactose intolerance    Major depression, recurrent, chronic    Obesity    OCD (obsessive compulsive disorder)    OSA (obstructive sleep apnea)    Other chronic pain    Periodic limb movement disorder    Periodic limb movements of sleep    Prediabetes    Pure hypercholesterolemia    Restless legs    Sleep apnea    wears cpap    Vitamin D deficiency     Family History  Problem Relation Age of Onset   Cancer Mother        breast and ovarian   Anxiety disorder Mother    Breast cancer Mother    Ovarian cancer Mother    Depression Father        bi-polar   Hyperlipidemia Father    Heart disease Father    Sudden death Father    Bipolar disorder Father    Sleep apnea Father    Obesity Father    Depression Son    Colon cancer Neg Hx    Colon polyps Neg Hx    Esophageal cancer Neg Hx    Rectal cancer Neg Hx    Stomach cancer Neg Hx     Social History   Socioeconomic History   Marital status: Married    Spouse name: Not on file   Number of children: Not on file   Years of education: Not on file   Highest education level: Not on file  Occupational History   Occupation: retired Pensions consultant  Tobacco Use   Smoking status: Never   Smokeless tobacco: Never  Substance and Sexual Activity   Alcohol use:  Yes    Comment: occasionally    Drug use: No   Sexual activity: Not on file  Other Topics Concern   Not on file  Social History Narrative   Not on file   Social Drivers of Health   Financial Resource Strain: Not on file  Food Insecurity: No Food Insecurity (01/25/2022)   Received from [facility] visits prior to 03/24/2022[facility]   Hunger Vital Sign    Within the past 12 months, you worried that your food would run out before you got the money to buy more.: Never true    Within the past 12 months, the food you bought just didn't last and you didn't have money to get more.: Never true  Transportation Needs: No Transportation Needs (01/25/2022)   Received from [facility], [facility] visits prior to 03/24/2022.   PRAPARE - Administrator, Civil Service (Medical): No    Lack of Transportation (Non-Medical): No  Physical Activity: Not on file  Stress: Not on file  Social Connections: Not on file  Intimate Partner Violence: Not on file    Past Medical History, Surgical history, Social history, and Family history were reviewed and updated as appropriate.   Please see review of systems for further details on the patient's review from today.   Objective:   Physical Exam:  There were no vitals taken for this visit.  Physical Exam Constitutional:      General: He is not in acute distress.    Appearance: He is obese.  Musculoskeletal:        General: No deformity.  Neurological:  Mental Status: He is alert and oriented to person, place, and time.     Cranial Nerves: No dysarthria.     Motor: Weakness present.     Coordination: Coordination abnormal.     Comments: Shortened gait is better.  No sig tremor.  less Slow but still not normal.  Psychiatric:        Attention and Perception: Attention and perception normal. He does not perceive auditory or visual hallucinations.        Mood and Affect: Mood is anxious and  depressed. Affect is not labile or blunt.        Speech: Speech normal.        Behavior: Behavior is not slowed. Behavior is cooperative.        Thought Content: Thought content normal. Thought content is not paranoid or delusional. Thought content does not include homicidal or suicidal ideation. Thought content does not include suicidal plan.        Cognition and Memory: Cognition and memory normal.        Judgment: Judgment normal.     Comments: Insight intact Depression 80% better OCD is about the same and not the main problem More expressive and more normal affect.  Quicker responses.    November 06, 2018: [place] Cog test in office within normal limits MMSE 28/30. Animal fluency 17 . (borderline) Taken as a whole, no indication to pursue neuropsychological testing.  Mini-Mental status exam 28/30 on 10/27/20.  No evidence of dementia.  Lab Review:   Vitamin D level acceptable at 54.5.   Normal B12 and folate and TSH in last couple of years..  Echocardiogram is stable re: AVR over the last 8 years and not likely the cause of lethargy.   06/28/23 MRI head:  IMPRESSION: 1. No acute intracranial abnormality. 2. 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 Head CT may be helpful for further assessment of the degree of mineralization. 3. Findings of chronic small vessel ischemia.  SABRAres Assessment: Plan:    Montavious was seen today for follow-up, depression, fatigue, add and anxiety.  Diagnoses and all orders for this visit:  Recurrent major depression resistant to treatment  Mixed obsessional thoughts and acts  Attention deficit hyperactivity disorder (ADHD), predominantly inattentive type  Hypersomnia with sleep apnea  Mild cognitive impairment  Restless legs syndrome  Low vitamin D level  Erectile disorder, acquired, generalized, moderate   Mr.  Aloisi has a long history of depression and OCD.  Major depression with fatigue, cognitive and physical slowness, anhedonia is much worse.  Started Spravato  and improving.  Was better energy with duloxetine  90 vs Lexapro  so increased to 120 mg daily DT worsening depression.    But it was not better. So reduced it back to 90 mg daily.    Now reduced to 60 mg daily to see if ED better and also bc recent U retention.  (Not likely related) now dep bettter with Spravato. Disc risk worsening dep and anxiety.   He has some compulsive checking and obsessions around the house maintenance.  When travels then tends to have less OCD bc triggered less.    Received 84 mg Spravato today. The patient experienced the typical dissociation which gradually resolved over the 2-hour period of observation.  There were no complications.  Specifically the patient did not have nausea or vomiting or headache.  Blood pressures remained within normal ranges at the 40-minute and 2-hour  follow-up intervals.  By the time the 2-hour observation period was met the patient was alert and oriented and able to exit without assistance.  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  See nursing note for further details. Emphasized need to relax with Spravato and not return calls, read, return chats, emails etc. Started Spravato 06/25/23. continue weekly @ 84 mg Strongly rec avoid making any decisions nor dealing with any verbal information during the Spravato procedure.  He is not compliant so far with this.  Disc in detail with him and his wife.  His OCD is chronic with checking lites, stove, etc. .  It is better at the moment DT depression being worse.  Disc CBT techniques and potential for more therapy to address. Option Dr. Marijean.  reduce Concerta  to 27  mg AM to reduce SE.  He hasn't started yet. He wants to use it for depression and  cognitive concerns. Consider alternative strategies.    Discussed potential benefits, risks, and side effects of stimulants with patient to include increased heart rate, palpitations, insomnia, increased anxiety, increased irritability, or decreased appetite.  Instructed patient to contact office if experiencing any significant tolerability issues.  Extensive discussion of sleep study and he has a copy.  Why is there virtually no N3 & REM sleep?  Is that affecting daytime alertness and fatigue?  Vs how much is related to mild OSA and PLMS?  Disc this in detail.  Wrote correspondence with sleep doc about it.  Options for sleep & fatigue:  Difficult to assess  bc [place] then [place] 3 [place].   Focus on reduction in OSA Focus on improving deep stage sleep.  ? Rx low dose mirtazapine or alternatives Focus on leg movements (hx RLS/PLMS) continue Trial as had been suggested Lyrica  for FM type sx and may help RLS/PLMS.  Better dreaming on 100 mg HS and too sleepy on 150.  Decreased  to 100 mg HS for sleep and back pain and RLS No ropinirole  needed.   Disc SE.  Not having any.   Use LED Xanax  and try to avoid BZ daytime bc fatigue.  He doesn't use it much daythime.  Using 0..25-0.5 mg at night.  May need less with more Lyrica  at HS and try to minimize.  No hangover. We discussed the short-term risks associated with benzodiazepines including sedation and increased fall risk among others.  Discussed long-term side effect risk including dependence, potential withdrawal symptoms, and the potential eventual dose-related risk of dementia.  But recent studies from 2020 dispute this association between benzodiazepines and dementia risk. Newer studies in 2020 do not support an association with dementia.  Stimulant partially successfully used off label to augment antidepressants for depression and have resulted in improved productivity and attention.    Previous screening of memory was not  suggestive of any neuro degenerative process: Mini-Mental status exam 28/30 on 10/27/20.   Recent cognition worse as dep worsened.  Also gait is short and slow.   neuro appt.  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  Disc Dr. Collie note and question of whether mobility px related to hx Vraylar .  No AIM noted but reports at times with have invol tongue protrusion.  Not uncomfortable.  Plan:  duloxetine  60 mg daily to reduce ED  Lyrica  100 mg HS.   Reduced MPH ER to 27 mg AM, but hasn't taken yet.  Spravato disc in detail.  He wants to continue.  He and wife notice he's better.  Administer now drop back to weekly  He's still improving.   He is tolerating it better  Follow-up   Lorene Macintosh MD, DFAPA.  Please see After Visit Summary for patient specific instructions.  Future Appointments  Date Time Provider Department Center  11/19/2023  9:00 AM CP-NURSE [facility] None  11/19/2023  9:00 AM Teresa Redell LABOR, NP [facility] None  11/21/2023  3:00 PM Cottle, Lorene KANDICE Raddle., MD [facility] None  01/30/2024  3:00 PM Tat, Asberry RAMAN, DO [facility] None  04/02/2024  8:00 AM Gilgannon, Chloe N, PT [facility]     No orders of the defined types were placed in this encounter.      -------------------------------

## 2023-11-14 ENCOUNTER — Encounter: Admitting: Psychiatry

## 2023-11-14 ENCOUNTER — Encounter

## 2023-11-14 DIAGNOSIS — I6523 Occlusion and stenosis of bilateral carotid arteries: Secondary | ICD-10-CM | POA: Diagnosis not present

## 2023-11-18 DIAGNOSIS — N3941 Urge incontinence: Secondary | ICD-10-CM | POA: Diagnosis not present

## 2023-11-18 DIAGNOSIS — R338 Other retention of urine: Secondary | ICD-10-CM | POA: Diagnosis not present

## 2023-11-19 ENCOUNTER — Ambulatory Visit: Admitting: Behavioral Health

## 2023-11-19 ENCOUNTER — Ambulatory Visit

## 2023-11-19 ENCOUNTER — Encounter: Payer: Self-pay | Admitting: Neurology

## 2023-11-19 VITALS — BP 130/76 | HR 64

## 2023-11-19 DIAGNOSIS — F339 Major depressive disorder, recurrent, unspecified: Secondary | ICD-10-CM | POA: Diagnosis not present

## 2023-11-19 NOTE — Progress Notes (Signed)
 Nyal R Bullen [id number] 03-Aug-1950 73 y.o.  Subjective:   Patient ID:  Eric [name] is a 73 y.o. (DOB 02/29/1950) male.  Chief Complaint: No chief complaint on file.   HPI Eric [name] presents to the office today for follow-up of  treatment resistant depression (TRD).   Spravato treatment    Patient was administered Spravato 84mg  intranasally today. Patient was observed by provider throughout Spravato treatment. The patient experienced the typical dissociation which gradually resolved over the 2-hour period of observation. There were no complications. Specifically, the patient did not have any untoward side effects - feeling disconnected from themself, their thoughts, feelings and things around them, dizziness, nausea, feeling sleepy, decreased feeling of sensitivity (numbness) spinning sensation, feeling anxious, lack of energy, increased blood pressure, feeling happy or very excited, or headache. Blood pressures remained within normal ranges at the 40-minute and 2-hour follow-up intervals. By the time the 2-hour observation period was met the patient was alert and oriented and able to exit without assistance. Patient willing to continue Spravato administration for the treatment of resistant depression.    Reports Depression 3/10 today. Feels like depression is starting to improve. Pt is still cognitively impaired and drowsy at the 2 hour mark after reducing dose of Sprivato. Pt was slightly agitated and passive aggressive with speech today.  Some confusion when answering questions.  Wife is her to provide transportation.  No question or concerns this visit.  ECT-MADRS    Flowsheet Row Office Visit from 06/18/2023 in [facility] Office Visit from 04/16/2023 in [facility]  MADRS Total Score 37 36   PHQ2-9    Flowsheet Row Office Visit from 03/15/2020 in [facility] Total Score 3  PHQ-9 Total Score 9     Review of Systems:  Review of Systems  Constitutional: Negative.   Allergic/Immunologic: Negative.   Psychiatric/Behavioral:  Positive for dysphoric mood.     Medications: I have reviewed the patient's current medications.  Current Outpatient Medications  Medication Sig Dispense Refill   ALPRAZolam  (XANAX ) 0.25 MG tablet Take 1 tablet (0.25 mg total) by mouth 2 (two) times daily as needed for anxiety or sleep. 30 tablet 1   aspirin 81 MG tablet Take 81 mg by mouth daily.     Cholecalciferol (VITAMIN D-3) 5000 units TABS Take 5,000 Units by mouth daily.      DULoxetine  (CYMBALTA ) 30 MG capsule Take 3 capsules (90 mg total) by mouth daily. 270 capsule 0   methylphenidate  (CONCERTA ) 27 MG PO CR tablet Take 1 tablet (27 mg total) by mouth every morning. 30 tablet 0   methylphenidate  (RITALIN ) 10 MG tablet Take 3 tablets (30 mg total) by mouth daily as needed. 60 tablet 0   naltrexone  (DEPADE) 50 MG tablet Take 0.5 tablets (25 mg total) by mouth daily. 45 tablet 1   pregabalin  (LYRICA ) 50 MG capsule Take 1 capsule (50 mg total) by mouth at bedtime. 90 capsule 0   simvastatin (ZOCOR) 20 MG tablet Take 20 mg by mouth at bedtime.     ZEPBOUND  10 MG/0.5ML Pen Inject 10 mg into the skin once a week.     No current facility-administered medications for this visit.    Medication Side Effects: None  Allergies:  Allergies  Allergen Reactions   E.E.S. [Erythromycin] Hives   Macrolides And Ketolides Other (See Comments)    EES    Rosuvastatin  Other reaction(s): cramps    Past Medical History:  Diagnosis Date   Allergy    Anemia    iron- pt denies    Anxiety    Aortic cusp regurgitation    Carotid artery occlusion    Constipation    Coronary artery stenosis    Hyperlipidemia    Lactose intolerance    Major depression, recurrent, chronic    Obesity    OCD (obsessive compulsive disorder)    OSA (obstructive sleep apnea)    Other  chronic pain    Periodic limb movement disorder    Periodic limb movements of sleep    Prediabetes    Pure hypercholesterolemia    Restless legs    Sleep apnea    wears cpap    Vitamin D deficiency     Past Medical History, Surgical history, Social history, and Family history were reviewed and updated as appropriate.   Please see review of systems for further details on the patient's review from today.   Objective:   Physical Exam:  There were no vitals taken for this visit.  Physical Exam Neurological:     Motor: Weakness present.     Lab Review:  No results found for: NA, K, CL, CO2, GLUCOSE, BUN, CREATININE, CALCIUM, PROT, ALBUMIN, AST, ALT, ALKPHOS, BILITOT, GFRNONAA, GFRAA  No results found for: WBC, RBC, HGB, HCT, PLT, MCV, MCH, MCHC, RDW, LYMPHSABS, MONOABS, EOSABS, BASOSABS  No results found for: POCLITH, LITHIUM    No results found for: PHENYTOIN, PHENOBARB, VALPROATE, CBMZ   .res Assessment: Plan:    Recommendations:   Pt is very lethargic at 2 hour mark post session every visit. Needs assistance walking.    Continue Spravato treatment. Pt did not have any questions or concerns today.  Will continue to f/u with Lorene Macintosh MD for medication management.      Eric A. Offie Pickron, NP     There are no diagnoses linked to this encounter.   Please see After Visit Summary for patient specific instructions.  Future Appointments  Date Time Provider Department Center  11/21/2023  3:00 PM Cottle, Lorene KANDICE Raddle., MD [facility] None  01/30/2024  3:00 PM Tat, Asberry RAMAN, DO [facility] None  04/02/2024  8:00 AM Gilgannon, Chloe N, PT [facility]    No orders of the defined types were placed in this encounter.   -------------------------------

## 2023-11-19 NOTE — Progress Notes (Signed)
 NURSES NOTE:         Pt arrived for his #29 Spravato Treatment for treatment resistant depression, the starting dose was 56 mg (2 of the 28 mg nasal sprays). Pt is getting 84 mg again today after receiving 3 of the 56 mg the past 3 weeks due to sedation.  Eric [name] is a patient of Dr. Calhoun so he will follow his care throughout treatments and follow ups. Pt's Spravato is a medical authorization through buy and bill.  Spravato medication is stored at treatment center per REMS/FDA guidelines. The medication is required to be locked behind two doors per REMS/FDA protocol. Medication is also disposed of properly after each use per regulations. All documentation for REMS is completed and submitted per FDA/REMS requirements.          Began taking patient's vital signs at 9:17 AM 130/78, pulse 63, SpO2 98%. Instructed patient to blow his nose if needed then recline back to a 45 degree angle. Gave patient first dose 28 mg nasal spray, administered in each nostril as directed and observed by nurse, waited 5 more minutes for the second and third dose.  After all doses given pt did not complain of any nausea/vomiting. Pt was given water, snacks, and candy. Assessed his 40 minute vitals, 10:00 AM, 130/71, pulse 62, SpO2 90%. Explained he would be monitored for a total time of 120 minutes. Discharge vitals were taken at 11:10 AM 130/76, P 64, SpO2 90%. Eric Pizza, NP met with pt and his wife to discuss his treatment once his thoughts were clearer. Recommend he go home and sleep or just relax on the couch. No driving, no intense activities. Verbalized understanding. Nurse was with pt a total of 60 minutes for clinical assessment. Pt's wife picks him up after treatment. Instructed to call with any issues. Pt will return next Tuesday.   LOT [id number] EXP JUN 2028

## 2023-11-21 ENCOUNTER — Encounter: Admitting: Psychiatry

## 2023-11-25 ENCOUNTER — Encounter: Payer: Self-pay | Admitting: Radiology

## 2023-11-26 ENCOUNTER — Ambulatory Visit: Admitting: Psychiatry

## 2023-11-26 ENCOUNTER — Encounter: Payer: Self-pay | Admitting: Psychiatry

## 2023-11-26 ENCOUNTER — Ambulatory Visit

## 2023-11-26 ENCOUNTER — Encounter: Admitting: Psychiatry

## 2023-11-26 VITALS — BP 134/83 | HR 85

## 2023-11-26 DIAGNOSIS — F339 Major depressive disorder, recurrent, unspecified: Secondary | ICD-10-CM | POA: Diagnosis not present

## 2023-11-26 DIAGNOSIS — G2581 Restless legs syndrome: Secondary | ICD-10-CM

## 2023-11-26 DIAGNOSIS — F900 Attention-deficit hyperactivity disorder, predominantly inattentive type: Secondary | ICD-10-CM

## 2023-11-26 DIAGNOSIS — F422 Mixed obsessional thoughts and acts: Secondary | ICD-10-CM

## 2023-11-26 DIAGNOSIS — R7989 Other specified abnormal findings of blood chemistry: Secondary | ICD-10-CM

## 2023-11-26 DIAGNOSIS — G3184 Mild cognitive impairment, so stated: Secondary | ICD-10-CM

## 2023-11-26 DIAGNOSIS — G4710 Hypersomnia, unspecified: Secondary | ICD-10-CM

## 2023-11-26 DIAGNOSIS — F5221 Male erectile disorder: Secondary | ICD-10-CM

## 2023-11-26 MED ORDER — ALPRAZOLAM 0.25 MG PO TABS: 0.2500 mg | ORAL_TABLET | Freq: Two times a day (BID) | ORAL | 1 refills | Status: AC | PRN

## 2023-11-26 MED ORDER — METHYLPHENIDATE HCL ER (OSM) 27 MG PO TBCR: 27.0000 mg | EXTENDED_RELEASE_TABLET | ORAL | 0 refills | Status: DC

## 2023-11-26 NOTE — Progress Notes (Signed)
 NURSES NOTE:         Pt arrived for his #30 Spravato Treatment for treatment resistant depression, the starting dose was 56 mg (2 of the 28 mg nasal sprays). Pt is getting 84 mg again today after receiving 3 of the 56 mg the past 3 weeks due to sedation.  Jw is a patient of Dr. Calhoun so he will follow his care throughout treatments and follow ups. Pt's Spravato is a medical authorization through buy and bill.  Spravato medication is stored at treatment center per REMS/FDA guidelines. The medication is required to be locked behind two doors per REMS/FDA protocol. Medication is also disposed of properly after each use per regulations. All documentation for REMS is completed and submitted per FDA/REMS requirements.          Began taking patient's vital signs at 9:10 AM 110/88, pulse 71, SpO2 94%. Instructed patient to blow his nose if needed then recline back to a 45 degree angle. Gave patient first dose 28 mg nasal spray, administered in each nostril as directed and observed by nurse, waited 5 more minutes for the second and third dose.  After all doses given pt did not complain of any nausea/vomiting. Pt was given water, snacks, and candy. Assessed his 40 minute vitals, 9:50 AM, 133/73, pulse 66, SpO2 96%. Explained he would be monitored for a total time of 120 minutes. Discharge vitals were taken at 11:03 AM 128/70, P 66, SpO2 94%. Pt very sleepy today. Dr. Geoffry met with pt and his wife to discuss his treatment once his thoughts were clearer. Recommend he go home and sleep or just relax on the couch. No driving, no intense activities. Verbalized understanding. Nurse was with pt a total of 60 minutes for clinical assessment. Pt's wife picks him up after treatment. Instructed to call with any issues. Pt will return next Tuesday.   LOT [id number] EXP AUG 2028

## 2023-11-26 NOTE — Progress Notes (Signed)
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 noted: Worrying too much bc OCD.  Trying to decide about how to deal with a trigger lately.  OCD driving me crazy wanting to make things perfect.   Other than the trigger had a nice time since here.  GS here for 5 days at 73 yo.  Enjoyed that.   Taking semaglutide   down to 250# from 283#. Card at [facility] says he does not have Aortic aneurysm.  Measuring is OK.  Will FU with MRI in June.   Some diarrhea lately. Cannot stop worrying.  Triggered by the plumbing issue. Meds as above.  No SE of sig.   Plan:  switched to duloxetine  to 90 mg daily.  Off Lexapro .  Others as noted. Lyrica  100 mg HS. Has held Ritalin  10 TID, can resume as long as SBP below 140 per card.  none needed ropinirole  3 mg pm.  No risperidone .  02/18/23 appt noted:  with [name] Meds: as above. Taking Ritalin  30% of night.  Prn Xanax  rarely if gets off sleep cycle. No SE.   Terribly dep but not as bad as in the past. Taking Ritalin  appears to help significantly and started doing that.  Tend to stay in bed till noon but pattern of up late.   OCD about the same with some avoidance of detail work.   Afraid I'll throw away something I need.   She doesn't know whether Ritalin  helps No sig RLS and wife agrees. Thinks of deceased B at the holidays but worries over son Deward too.   Plan: Increase duloxetine  to 120 mg daily.  Off Lexapro .  Others as noted. Lyrica  100 mg HS.  resumed Ritalin  30 AM with some benefit. no risperidone .  03/19/23 appt noted: Psych med:  duloxetine  120, Ritalin  20 am  missing doses, Lyrica  100 HS, no risperidone , no ropinirole .   No improvement in depression since increase dose duloxetine .  More dep than usual.  Worrying about everything.  Including taxes.  All I can see is a black hole.  Making him feel negative about everything.   Keeping him from doing things.   OCD is not much different from when on Lexapro .  Wife agrees dep and distracted. Plan: resumed Ritalin  30 AM with some benefit. Resume Abilify  2 mg AM for dep.  04/16/23 appt noted:  wife here Psych med:  duloxetine  120, resumed Concerta  36 mg AM, Lyrica  100 HS, Abilify  2, no ropinirole .   Wife noted he seemed manic yesterday.  Invited window estimate against wife's will and without her input.   No mania noted until yesterday. He didn't feel manic yesterday.   He's noticed no change with Abilify  and still feels flat and a little low.  From [facility] energy and mood a little better.  Sleeps until 10-11 am about 12 hours. And asleep that whole time. Wife says he doesn't eat much bc he's not awake much. Trouble with hygiene.   Plan: Meds: continue duloxetine  to 120 mg daily.  Off Lexapro .  Lyrica  100 mg HS.  resumed Ritalin  30 AM with some benefit. DC Abiilify Vraylar  1.5 mg every other day Spravato disc in detail.  He wants to pursue if not better with Vraylar .  06/18/23 appt noted: with W Med:  duloxetine  120, resumed Concerta  36 mg AM, Lyrica  100 HS, no ropinirole .   Vraylar  1.5 every other day, no benefit Spravato denied by insurance but is being appealed. More trouble walking and shorter gait.  Neuro  in [place] appt in Sept.   Looking to get in in [place].  Can't be much more dep than I am.   OCD residual when has to make a decision.   ED is more of a px. Reduced voice family. Plan : Spravato  06/25/23 appt noted: with W Med:  duloxetine  120, Concerta  36 mg AM, Lyrica  100 HS, no ropinirole .   Vraylar  1.5 daily No SE Received Spravato 56 mg today.  Experienced very mild dissociation and no sig HA, N.  But some dizziness.  Resolved by end of 2 hours observation.  Able to leave office without assistance.  Ongoing dep without change.  Slow, reduced cognition, anhedonia, forgetful, low motivation. No other concerns with meds.   06/27/23 appt noted:  Med: Med:  duloxetine  120, Concerta  36 mg AM, Lyrica  100 HS, no ropinirole .   Stopped Vraylar  1.5 daily No SE Received Spravato 84 mg todayfor the first time..  Experienced very mild dissociation and no sig HA, N.  But some dizziness.  Resolved by end of 2 hours observation.  Able to leave office without assistance.  Ongoing dep without change.  Slow, reduced cognition, anhedonia, forgetful, low motivation. No other concerns with meds.   07/02/23 appt oted: Med: Med:  duloxetine  90, Concerta  36 mg AM, Lyrica  100 HS, no ropinirole .   Stopped Vraylar  1.5 daily No SE Received Spravato 84 mg today.  Experienced very mild dissociation and no sig HA, N.  But some dizziness.  Resolved by end of 2 hours observation.  Mildly drowsy at end of session. BC of gait px he needed to use WC to leave the office.  Per wife gait gradually worsened over a couple of years but accelerated recently in 3 mos or so.  Short gait.  Not due to pain.  Pending neuro appt.  MRI last week not read yet. No problems with meds. Wife noticed he's shown more initiative at home since [place] ex doing crossword puzzles.  More engaged.  He feels lighter already with it.  07/15/23 appt noted:  Med: Med:  duloxetine  90, Concerta  36 mg AM, Lyrica  100 HS, no ropinirole .  No SE Received Spravato  84 mg today.  Experienced very mild dissociation and no sig HA, N.  But some dizziness.  Resolved by end of 2 hours observation.  Mildly drowsy at end of session. BC of gait px he needed to use WC to leave the office. Seen with W.   W noted clear benefit Spravato with stronger voice, spontaneous chores he wasn't doing.  More engaged in coverstation.  Pt agrees. Disc concerns over gait px and his neuro visit and MRI scan suggesting Fahr's DZ.  He'll discuss further with DR. Tat tomorrow.  07/18/23 appt noted:  Med: Med:  duloxetine  90, Concerta  36 mg AM, Lyrica  100 HS, no ropinirole .  No SE Received Spravato 84 mg today.  Experienced very mild dissociation and no sig HA, N.  But some dizziness.  Resolved by end of 2 hours observation.  Mildly drowsy at end of session. BC of gait px he needed to use WC to leave the office. No med concerns.  Seen with W.    Both agree dep is better .  More affective range.  More socially engaged.  Quicker thought.  More active .  Less down and less negative.  07/23/23 appt noted: Med: Med:  duloxetine  90, Concerta  36 mg AM, Lyrica  100 HS, no ropinirole .  No SE Received Spravato 84 mg today.  Experienced very mild dissociation and no sig HA, N.  But some dizziness.  Resolved by end of 2 hours observation.  Mildly drowsy at end of session. BC of gait px he needed to use WC to leave the office. No med concerns.  Seen with W.    Dep is still improved.  But is dealing with poor gait, weak and slow.  Will start neurorehab today.   Anxiety re: OCD manageable. Thought and affect quicker and more responsive .  Humor returned.  07/25/23 appt:  Med: Med:  duloxetine  90, Concerta  36 mg AM, Lyrica  100 HS, no ropinirole .  No SE Received Spravato 84 mg today.  Experienced very mild dissociation and no sig HA, N.  But some dizziness.  Resolved by end of 2 hours observation.  Mildly drowsy at end of session. BC of gait px he needed to use WC to leave the office. No med  concerns.  Seen with W.    Overall mood still improving but not 100%.  Starting neurorehab for weakness.  No new med concerns.  Satisfied with meds.  07/30/23 appt noted:  Med: Med:  duloxetine  90, Concerta  36 mg AM, Lyrica  100 HS, no ropinirole .  No SE Received Spravato 84 mg today.  Experienced very mild dissociation and no sig HA, N.  But some dizziness.  Resolved by end of 2 hours observation.  Mildly drowsy at end of session. BC of gait px he needed to use WC to leave the office. No med concerns.  Seen with W.    Both agree mood is improving and activity and interest.  Not 100% but limited by mobility and starting PT.    08/15/23 appt noted:  Med:  duloxetine  90, Concerta  36 mg AM, Lyrica  100  HS, no ropinirole .  No SE Received Spravato 56 mg today.  Experienced very mild dissociation and no sig HA, N.  But some dizziness.  Resolved by end of 2 hours observation.  Mildly drowsy at end of session.  But less than when on 84 mg. BC of gait px he needed to use WC to leave the office. No med concerns.  Seen with W.    Less drowsy at  end of time here than when got 84 mg daily. They both agree good response with dep and anxiety.  More involved and better interaction socially.  More initiative.  08/20/23 appt noted:  Med:  duloxetine  90, Concerta  36 mg AM, Lyrica  100 HS, no ropinirole .  No SE Received Spravato 56 mg today.  Experienced very mild dissociation and no sig HA, N.  But some dizziness.  Resolved by end of 2 hours observation.  Mildly drowsy at end of session.  But less than when on 84 mg. BC of gait px he needed to use WC to leave the office. No med concerns.  Seen with W.   She's still concerned he sleeps too much.  He's not sure why he does so.  Overall mood, initiative, socialization is better. Less drowsy at  end of time here than when got 84 mg daily.  08/27/23 appt noted:  Med:  duloxetine  90, Concerta  36 mg AM, Lyrica  100 HS, no ropinirole .  No SE Received Spravato 84 mg  today.  Experienced very mild dissociation and no sig HA, N.  But some dizziness.  Resolved by end of 2 hours observation.  Mildly drowsy at end of session.  Walking is some better with PT.  Wife agrees.  He's more interested, responsive, engaged.  Better cognition. After Spravato 56 he does not have to nap when goes home. After 84 mg needed to nap but he wants to increase back to 84 mg to get max effect. More sedate after 84 mg today but is able to clear by end of session.  But wants to continue it.  Over all doing pretty well  with less dep and more engagement and activity including spontaneously cleaning things.   09/10/23 appt noted:  Med:  duloxetine  90, Concerta  36 mg AM, Lyrica  100 HS, no ropinirole .  No SE Received Spravato 84 mg today.  Experienced very mild dissociation and no sig HA, N.  But some dizziness.  Resolved by end of 2 hours observation.  Mildly drowsy at end of session.  Mood continues to improve overall and anxiety better too.  Walking is improving.  Cognition and emotional engagment, activity are all better. Plan no med changes  09/24/23 appt noted: Med: Med:  duloxetine  90, Concerta  36 mg AM, Lyrica  100 HS, no ropinirole .  No SE Received Spravato 84 mg today.  Experienced very mild dissociation and no sig HA, N.  But some dizziness.  Resolved by end of 2 hours observation.  Mildly drowsy at end of session.  Mood continues to improve overall and anxiety better too.  Walking is improving.  Cognition and emotional engagment, activity are all better. Asks about reducing duloxetine  bc ED issues. Plan: reduce duloxetine  to 60 mg daily to reduce ED  10/08/23 appt Med: Med:  duloxetine  60, Concerta  36 mg AM, Lyrica  100 HS, no ropinirole .  Received Spravato 84 mg today.  Experienced very mild dissociation and no sig HA, N.  But some dizziness.  Resolved by end of 2 hours observation.  Mildly drowsy at end of  session.  Some dysphoria this week but he and wife continue to see  improvement and benefit with meds and Spravato.   SE concerta  feeling jittery and skips it at times.   10/15/23 appt noted:  Med :  duloxetine  60, Concerta  36 mg AM, Lyrica  100 HS, no ropinirole .  Received Spravato 84 mg today.  Experienced very mild dissociation and no sig HA, N.  But some dizziness.  Resolved by end of 2 hours observation.  Mildly drowsy at end of session.  he and wife continue to see improvement and benefit with meds and Spravato.   Minimal depression now. Stressed by nearly scammed this week by caller, but wife stopped. It.  She is concerned bc in the past he would not have been so easily deceived.  However he says they were just good and he ultimately didn't get scammed.  No concerns with current meds. Plan reduce Concerta  to 27 mg AM  10/22/23 appt noted:  Med :  duloxetine  60, Concerta  36 mg AM, Lyrica  100 HS, no ropinirole .  Received Spravato 84 mg today.  Experienced very mild dissociation and no sig HA, N.  But some dizziness.  Resolved by end of 2 hours observation.  Mildly drowsy at end of session.  he and wife continue to see improvement and benefit with meds and Spravato.   Minimal depression now. Hasn't reduced Concerta  yet.   Is wearing a bladder cath bc U retention.   Suspected prostate px in workup.  Mood is still improving.  Will be here next week then going to see GS in [place] for a week. Plan: as noted reduce Concerta  2 AM  10/29/23 appt noted;  Med :  duloxetine  60, Concerta  36 mg AM, Lyrica  100 HS, no ropinirole .  Received Spravato 84 mg today.   Experienced very mild dissociation and no sig HA, N.  But some dizziness.  Resolved by end of 2 hours observation.  Mildly drowsy at end of session.  he and wife continue to see improvement and benefit with meds and Spravato.   Minimal depression now. Hasn't reduced Concerta  yet.   Is wearing a bladder cath bc U retention.   Frustrated over this but gets it out tomorrow. Pretty good with mood. No concerns with  meds.  Intermittent compliance with Concerta .   Wife concerns over poor judgment at times.  Like when makes financial decisions while receiving Spravato.  10/31/23 appt noted:  Med :  duloxetine  60, Concerta  36 mg AM, Lyrica  100 HS, no ropinirole .  Received Spravato 84 mg today.   Experienced very mild dissociation and no sig HA, N.  But some dizziness.  Resolved by end of 2 hours observation.  Mildly drowsy at end of session.  he and wife continue to see improvement and benefit with meds and Spravato.   Minimal depression now. Hasn't reduced Concerta  yet.   Is wearing a bladder cath bc U retention.   Frustrated over this. Mood is better markedly with Spravato.  Not as much OCD lately but distracted by health problems.    11/11/23 appt noted:  Med :  duloxetine  60, Concerta  36 mg AM, Lyrica  100 HS, no ropinirole .  Received Spravato 84 mg today.   Experienced very mild dissociation and no sig HA, N.  But some dizziness.  Resolved by end of 2 hours observation.  Mildly drowsy at end of session.  he and wife continue to see improvement and benefit with meds and Spravato.   Minimal depression now. Trip  to [place] to visit family went well sitting with GS.  Missed a Spravato admin.   Dep remained manageable generally.  Wife indicated he was negative this am but he feels better after Spravato.  Disc concerns abotu gait gradually better.  OCD is better.   Plan reduce Concerta  to 27 mg am  11/26/23 appt noted: Med :  duloxetine  60, Concerta  27 mg AM, Lyrica  100 HS, no ropinirole .  Received Spravato 84 mg today.   Experienced very mild dissociation and no sig HA, N.  But some dizziness.  Resolved by end of 2 hours observation.  Mildly drowsy at end of session.  he and wife continue to see improvement and benefit with meds and Spravato.   Minimal depression now. Done fine with less Concerta .  Mood is better and function better and wife agrees.  Satisfied with meds.   ECT-MADRS    Flowsheet Row Office  Visit from 06/18/2023 in [facility] Office Visit from 04/16/2023 in [facility]  MADRS Total Score 37 36   [id number]    Flowsheet Row Office Visit from 03/15/2020 in [facility] Total Score 3  PHQ-9 Total Score 9     B schizophrenic SUI. After M's death. PCP Cecil Ee at [facility] at 73 years old.  Prior psychiatric medication trials include  Lexapro  20, citalopram NR, clomipramine weight gain, paroxetine, fluoxetine, Luvox, Trintellix,   Increase Lexapro  back to 20 mg January 2020. & 10/2020 bupropion ,  Auvelity NR  Abilify  10 fatigue,  Cerefolin NAC, and   Naltrexone  sexual SE Lyrica  150 tired  pramipexole,  ropinirole  Adderall XR & IR sexual SE,  Ritalin  30, Concerta  72 mg AM NR modafinil  and Nuvigil,   History [employer] OCD  Review of Systems:  Review of Systems  Constitutional:  Positive for fatigue. Negative for fever.  Cardiovascular:  Positive for palpitations. Negative for chest pain.  Genitourinary:  Positive for difficulty urinating.       ED  Musculoskeletal:  Positive for arthralgias, gait problem and myalgias.  Neurological:  Positive for weakness. Negative for dizziness and syncope.  Psychiatric/Behavioral:  Negative for agitation, behavioral problems, confusion, decreased concentration, dysphoric mood, hallucinations, self-injury, sleep disturbance and suicidal ideas. The patient is nervous/anxious. The patient is not hyperactive.     Medications: I have reviewed the patient's current medications.  Current Outpatient Medications  Medication Sig Dispense Refill   aspirin 81 MG tablet Take 81 mg by mouth daily.     Cholecalciferol (VITAMIN D-3) 5000 units TABS Take 5,000 Units by mouth daily.      DULoxetine  (CYMBALTA ) 30 MG capsule Take 3 capsules (90 mg total) by mouth daily. 270 capsule 0   naltrexone  (DEPADE) 50 MG  tablet Take 0.5 tablets (25 mg total) by mouth daily. 45 tablet 1   pregabalin  (LYRICA ) 50 MG capsule Take 1 capsule (50 mg total) by mouth at bedtime. 90 capsule 0   simvastatin (ZOCOR) 20 MG tablet Take 20 mg by mouth at bedtime.     ZEPBOUND  10 MG/0.5ML Pen Inject 10 mg into the skin once a week.     ALPRAZolam  (XANAX ) 0.25 MG tablet Take 1 tablet (0.25 mg total) by mouth 2 (two) times daily as needed for anxiety or sleep. 30 tablet 1   methylphenidate  (CONCERTA ) 27 MG PO CR tablet Take 1 tablet (27 mg total) by mouth every morning. 30 tablet 0   No  current facility-administered medications for this visit.    Medication Side Effects: None sexual SE are better not  All gone.  Allergies:  Allergies  Allergen Reactions   E.E.S. [Erythromycin] Hives   Macrolides And Ketolides Other (See Comments)    EES    Rosuvastatin     Other reaction(s): cramps    Past Medical History:  Diagnosis Date   Allergy    Anemia    iron- pt denies    Anxiety    Aortic cusp regurgitation    Carotid artery occlusion    Constipation    Coronary artery stenosis    Hyperlipidemia    Lactose intolerance    Major depression, recurrent, chronic    Obesity    OCD (obsessive compulsive disorder)    OSA (obstructive sleep apnea)    Other chronic pain    Periodic limb movement disorder    Periodic limb movements of sleep    Prediabetes    Pure hypercholesterolemia    Restless legs    Sleep apnea    wears cpap    Vitamin D deficiency     Family History  Problem Relation Age of Onset   Cancer Mother        breast and ovarian   Anxiety disorder Mother    Breast cancer Mother    Ovarian cancer Mother    Depression Father        bi-polar   Hyperlipidemia Father    Heart disease Father    Sudden death Father    Bipolar disorder Father    Sleep apnea Father    Obesity Father    Depression Son    Colon cancer Neg Hx    Colon polyps Neg Hx    Esophageal cancer Neg Hx    Rectal cancer Neg Hx     Stomach cancer Neg Hx     Social History   Socioeconomic History   Marital status: Married    Spouse name: Not on file   Number of children: Not on file   Years of education: Not on file   Highest education level: Not on file  Occupational History   Occupation: retired pensions consultant  Tobacco Use   Smoking status: Never   Smokeless tobacco: Never  Substance and Sexual Activity   Alcohol use: Yes    Comment: occasionally    Drug use: No   Sexual activity: Not on file  Other Topics Concern   Not on file  Social History Narrative   Not on file   Social Drivers of Health   Financial Resource Strain: Not on file  Food Insecurity: No Food Insecurity (01/25/2022)   Received from [facility] visits prior to 03/24/2022[facility]   Hunger Vital Sign    Within the past 12 months, you worried that your food would run out before you got the money to buy more.: Never true    Within the past 12 months, the food you bought just didn't last and you didn't have money to get more.: Never true  Transportation Needs: No Transportation Needs (01/25/2022)   Received from [facility], [facility] visits prior to 03/24/2022.   PRAPARE - Administrator, Civil Service (Medical): No    Lack of Transportation (Non-Medical): No  Physical Activity: Not on file  Stress: Not on file  Social Connections: Not on file  Intimate Partner Violence: Not on file    Past Medical History, Surgical history,  Social history, and Family history were reviewed and updated as appropriate.   Please see review of systems for further details on the patient's review from today.   Objective:   Physical Exam:  There were no vitals taken for this visit.  Physical Exam Constitutional:      General: He is not in acute distress.    Appearance: He is obese.  Musculoskeletal:        General: No deformity.  Neurological:     Mental Status: He is alert and oriented to  person, place, and time.     Cranial Nerves: No dysarthria.     Motor: Weakness present.     Coordination: Coordination abnormal.     Comments: Shortened gait is better.  No sig tremor.  less Slow but still not normal.  Psychiatric:        Attention and Perception: Attention and perception normal. He does not perceive auditory or visual hallucinations.        Mood and Affect: Mood is anxious and depressed. Affect is not labile or blunt.        Speech: Speech normal.        Behavior: Behavior is not slowed. Behavior is cooperative.        Thought Content: Thought content normal. Thought content is not paranoid or delusional. Thought content does not include homicidal or suicidal ideation. Thought content does not include suicidal plan.        Cognition and Memory: Cognition and memory normal.        Judgment: Judgment normal.     Comments: Insight intact Depression manageable. OCD is about the same and not the main problem More expressive and more normal affect.  Quicker responses.    November 06, 2018: [place] Cog test in office within normal limits MMSE 28/30. Animal fluency 17 . (borderline) Taken as a whole, no indication to pursue neuropsychological testing.  Mini-Mental status exam 28/30 on 10/27/20.  No evidence of dementia.  Lab Review:   Vitamin D level acceptable at 54.5.   Normal B12 and folate and TSH in last couple of years..  Echocardiogram is stable re: AVR over the last 8 years and not likely the cause of lethargy.   06/28/23 MRI head:  IMPRESSION: 1. No acute intracranial abnormality. 2. 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 Head CT may be helpful for further assessment of the degree of mineralization. 3. Findings of chronic small vessel ischemia.  SABRAres Assessment: Plan:    Rachard was seen today for follow-up, depression, anxiety, fatigue  and add.  Diagnoses and all orders for this visit:  Recurrent major depression resistant to treatment -     methylphenidate  (CONCERTA ) 27 MG PO CR tablet; Take 1 tablet (27 mg total) by mouth every morning.  Mixed obsessional thoughts and acts -     ALPRAZolam  (XANAX ) 0.25 MG tablet; Take 1 tablet (0.25 mg total) by mouth 2 (two) times daily as needed for anxiety or sleep.  Attention deficit hyperactivity disorder (ADHD), predominantly inattentive type -     methylphenidate  (CONCERTA ) 27 MG PO CR tablet; Take 1 tablet (27 mg total) by mouth every morning.  Hypersomnia with sleep apnea  Mild cognitive impairment  Restless legs syndrome  Low vitamin D level  Erectile disorder, acquired, generalized, moderate    Mr. Sauseda has a long history of depression and OCD.  Major depression with fatigue, cognitive and physical slowness,  anhedonia is much worse.  Started Spravato  and improving.  Was better energy with duloxetine  90 vs Lexapro  so increased to 120 mg daily DT worsening depression.    But it was not better. So reduced it back to 90 mg daily.    Now reduced to 60 mg daily to see if ED better and also bc recent U retention.  (Not likely related) now dep bettter with Spravato. Disc risk worsening dep and anxiety.   He has some compulsive checking and obsessions around the house maintenance.  When travels then tends to have less OCD bc triggered less.    Received 84 mg Spravato today. The patient experienced the typical dissociation which gradually resolved over the 2-hour period of observation.  There were no complications.  Specifically the patient did not have nausea or vomiting or headache.  Blood pressures remained within normal ranges at the 40-minute and 2-hour follow-up intervals.  By the time the 2-hour observation period was met the patient was alert and oriented and able to exit without assistance.  Patient feels the Spravato administration is helpful for the treatment  resistant depression and would like to continue the treatment.  See nursing note for further details. Emphasized need to relax with Spravato and not return calls, read, return chats, emails etc. Started Spravato 06/25/23. continue weekly @ 84 mg Strongly rec avoid making any decisions nor dealing with any verbal information during the Spravato procedure.  He is not compliant so far with this.  Disc in detail with him and his wife.  His OCD is chronic with checking lites, stove, etc. .  It is better at the moment DT depression being worse.  Disc CBT techniques and potential for more therapy to address. Option Dr. Marijean.  Concerta  to 27  mg AM to reduce SE.   He wants to use it for depression and cognitive concerns.  Discussed potential benefits, risks, and side effects of stimulants with patient to include increased heart rate, palpitations, insomnia, increased anxiety, increased irritability, or decreased appetite.  Instructed patient to contact office if experiencing any significant tolerability issues.  Extensive discussion of sleep study and he has a copy.  Why is there virtually no N3 & REM sleep?  Is that affecting daytime alertness and fatigue?  Vs how much is related to mild OSA and PLMS?  Disc this in detail.  Wrote correspondence with sleep doc about it.  Options for sleep & fatigue:  Difficult to assess  bc [place] then [place] for 3 weeks.   Focus on reduction in OSA Focus on improving deep stage sleep.  ? Rx low dose mirtazapine or alternatives Focus on leg movements (hx RLS/PLMS) continue Trial as had been suggested Lyrica  for FM type sx and may help RLS/PLMS.  Better dreaming on 100 mg HS and too sleepy on 150.  Decreased  to 100 mg HS for sleep and back pain and RLS No ropinirole  needed.   Disc SE.  Not having any.   Use LED Xanax  and try to avoid BZ daytime bc fatigue.  He doesn't use it much daythime.  Using 0..25-0.5 mg at night.  May need less with more  Lyrica  at [facility] and try to minimize.  No hangover. We discussed the short-term risks associated with benzodiazepines including sedation and increased fall risk among others.  Discussed long-term side effect risk including dependence, potential withdrawal symptoms, and the potential eventual dose-related risk of dementia.  But recent studies from 2020  dispute this association between benzodiazepines and dementia risk. Newer studies in 2020 do not support an association with dementia.  Stimulant partially successfully used off label to augment antidepressants for depression and have resulted in improved productivity and attention.    Previous screening of memory was not suggestive of any neuro degenerative process: Mini-Mental status exam 28/30 on 10/27/20.   Recent cognition worse as dep worsened.  Also gait is short and slow.   neuro appt.  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  Disc Dr. Collie note and question of whether mobility px related to hx Vraylar .  No AIM noted but reports at times with have invol tongue protrusion.  Not uncomfortable.  Plan:  duloxetine  60 mg daily to reduce ED  Lyrica  100 mg HS.   Continue [facility] ER to 27 mg AM,    Spravato disc in detail.  He wants to continue.  He and wife notice he's better.  Administer now drop back to weekly  He's still improving.   He is tolerating it better  Follow-up   Lorene Macintosh MD, DFAPA.  Please see After Visit Summary for patient specific instructions.  Future Appointments  Date Time Provider Department Center  12/03/2023  9:00 AM CP-NURSE CP-CP None  12/03/2023  9:00 AM Teresa Rogue A, NP CP-CP None  12/10/2023  9:00 AM CP-NURSE CP-CP None  12/10/2023  9:00 AM White, Rogue LABOR, NP CP-CP None  01/30/2024  3:00 PM Tat, Asberry RAMAN, DO [facility] None  04/02/2024  8:00 AM Gilgannon, Chloe N, PT [facility]     No orders of the defined types were placed in this encounter.      -------------------------------

## 2023-11-28 DIAGNOSIS — R531 Weakness: Secondary | ICD-10-CM | POA: Diagnosis not present

## 2023-11-28 DIAGNOSIS — E7800 Pure hypercholesterolemia, unspecified: Secondary | ICD-10-CM | POA: Diagnosis not present

## 2023-11-28 DIAGNOSIS — G4710 Hypersomnia, unspecified: Secondary | ICD-10-CM | POA: Diagnosis not present

## 2023-11-28 DIAGNOSIS — G4733 Obstructive sleep apnea (adult) (pediatric): Secondary | ICD-10-CM | POA: Diagnosis not present

## 2023-12-03 ENCOUNTER — Encounter: Payer: Self-pay | Admitting: Behavioral Health

## 2023-12-03 ENCOUNTER — Ambulatory Visit: Admitting: Behavioral Health

## 2023-12-03 ENCOUNTER — Ambulatory Visit

## 2023-12-03 VITALS — BP 132/72 | HR 64

## 2023-12-03 DIAGNOSIS — F339 Major depressive disorder, recurrent, unspecified: Secondary | ICD-10-CM

## 2023-12-03 NOTE — Progress Notes (Signed)
 NURSES NOTE:         Pt arrived for his #31 Spravato Treatment for treatment resistant depression, the starting dose was 56 mg (2 of the 28 mg nasal sprays). Pt is getting 84 mg again today after receiving 3 of the 56 mg the past 3 weeks due to sedation.  Jiovanny is a patient of Dr. Calhoun so he will follow his care throughout treatments and follow ups. Pt's Spravato is a medical authorization through buy and bill.  Spravato medication is stored at treatment center per REMS/FDA guidelines. The medication is required to be locked behind two doors per REMS/FDA protocol. Medication is also disposed of properly after each use per regulations. All documentation for REMS is completed and submitted per FDA/REMS requirements.          Began taking patient's vital signs at 9:05 AM 150/54, pulse 67, SpO2 95%. Instructed patient to blow his nose if needed then recline back to a 45 degree angle. Gave patient first dose 28 mg nasal spray, administered in each nostril as directed and observed by nurse, waited 5 more minutes for the second and third dose.  After all doses given pt did not complain of any nausea/vomiting. Pt was given water, snacks, and candy. Assessed his 40 minute vitals, 9:45 AM, 138/72, pulse 65, SpO2 95%. Explained he would be monitored for a total time of 120 minutes. Discharge vitals were taken at 11:09 AM 132/72, P 64, SpO2 97%. [name], NP met with pt and his wife to discuss his treatment once his thoughts were clearer. Recommend he go home and sleep or just relax on the couch. No driving, no intense activities. Verbalized understanding. Nurse was with pt a total of 60 minutes for clinical assessment. Pt's wife picks him up after treatment. Instructed to call with any issues. Pt will return next Tuesday.   (84 mg) LOT [id number] EXP MAR 2028

## 2023-12-03 NOTE — Progress Notes (Signed)
 Eric [name] [id number] [birth year] 73 y.o.  Subjective:   Patient ID:  Eric [name] is a 73 y.o. (DOB [birth year]) male.  Chief Complaint: No chief complaint on file.   HPI Eric [name] presents to the office today for follow-up of  treatment resistant depression (TRD).   Spravato treatment    Patient was administered Spravato 84mg  intranasally today. Patient was observed by provider throughout Spravato treatment. The patient experienced the typical dissociation which gradually resolved over the 2-hour period of observation. There were no complications. Specifically, the patient did not have any untoward side effects - feeling disconnected from themself, their thoughts, feelings and things around them, dizziness, nausea, feeling sleepy, decreased feeling of sensitivity (numbness) spinning sensation, feeling anxious, lack of energy, increased blood pressure, feeling happy or very excited, or headache. Blood pressures remained within normal ranges at the 40-minute and 2-hour follow-up intervals. By the time the 2-hour observation period was met the patient was alert and oriented and able to exit without assistance. Patient willing to continue Spravato administration for the treatment of resistant depression.    Reports Depression 2/10 today. Feels like depression is starting to improve. Pt is still cognitively impaired and drowsy at the 2 hour mark after reducing dose of Sprivato. Pt was calm and collected today. Answered questions well and did not appear as agitated last check with me.  Wife is her to provide transportation.  No question or concerns this visit.   ECT-MADRS    Flowsheet Row Office Visit from 06/18/2023 in [facility] Office Visit from 04/16/2023 in [facility]  MADRS Total Score 37 36   PHQ2-9    Flowsheet Row Office Visit from 03/15/2020 in [facility] Total Score 3  PHQ-9 Total Score 9     Review of Systems:  Review of Systems  Constitutional: Negative.   Allergic/Immunologic: Negative.   Neurological: Negative.   Psychiatric/Behavioral:  Positive for dysphoric mood.     Medications: I have reviewed the patient's current medications.  Current Outpatient Medications  Medication Sig Dispense Refill   ALPRAZolam  (XANAX ) 0.25 MG tablet Take 1 tablet (0.25 mg total) by mouth 2 (two) times daily as needed for anxiety or sleep. 30 tablet 1   aspirin 81 MG tablet Take 81 mg by mouth daily.     Cholecalciferol (VITAMIN D-3) 5000 units TABS Take 5,000 Units by mouth daily.      DULoxetine  (CYMBALTA ) 30 MG capsule Take 3 capsules (90 mg total) by mouth daily. 270 capsule 0   methylphenidate  (CONCERTA ) 27 MG PO CR tablet Take 1 tablet (27 mg total) by mouth every morning. 30 tablet 0   naltrexone  (DEPADE) 50 MG tablet Take 0.5 tablets (25 mg total) by mouth daily. 45 tablet 1   pregabalin  (LYRICA ) 50 MG capsule Take 1 capsule (50 mg total) by mouth at bedtime. 90 capsule 0   simvastatin (ZOCOR) 20 MG tablet Take 20 mg by mouth at bedtime.     ZEPBOUND  10 MG/0.5ML Pen Inject 10 mg into the skin once a week.     No current facility-administered medications for this visit.    Medication Side Effects: None  Allergies:  Allergies  Allergen Reactions   E.E.S. [Erythromycin] Hives   Macrolides And Ketolides Other (See Comments)    EES    Rosuvastatin     Other reaction(s): cramps    Past Medical History:  Diagnosis Date  Allergy    Anemia    iron- pt denies    Anxiety    Aortic cusp regurgitation    Carotid artery occlusion    Constipation    Coronary artery stenosis    Hyperlipidemia    Lactose intolerance    Major depression, recurrent, chronic    Obesity    OCD (obsessive compulsive disorder)    OSA (obstructive sleep apnea)    Other chronic pain    Periodic limb movement disorder    Periodic limb movements of  sleep    Prediabetes    Pure hypercholesterolemia    Restless legs    Sleep apnea    wears cpap    Vitamin D deficiency     Past Medical History, Surgical history, Social history, and Family history were reviewed and updated as appropriate.   Please see review of systems for further details on the patient's review from today.   Objective:   Physical Exam:  There were no vitals taken for this visit.  Physical Exam Constitutional:      General: He is not in acute distress.    Appearance: Normal appearance.  Neurological:     Mental Status: He is alert and oriented to person, place, and time.     Gait: Gait normal.  Psychiatric:        Attention and Perception: Attention and perception normal. He does not perceive auditory or visual hallucinations.        Mood and Affect: Mood and affect normal. Mood is not anxious or depressed. Affect is not labile.        Speech: Speech normal.        Behavior: Behavior normal. Behavior is cooperative.        Thought Content: Thought content normal.        Cognition and Memory: Cognition and memory normal.        Judgment: Judgment normal.     Lab Review:  No results found for: NA, K, CL, CO2, GLUCOSE, BUN, CREATININE, CALCIUM, PROT, ALBUMIN, AST, ALT, ALKPHOS, BILITOT, GFRNONAA, GFRAA  No results found for: WBC, RBC, HGB, HCT, PLT, MCV, MCH, MCHC, RDW, LYMPHSABS, MONOABS, EOSABS, BASOSABS  No results found for: POCLITH, LITHIUM    No results found for: PHENYTOIN, PHENOBARB, VALPROATE, CBMZ   .res Assessment: Plan:   Recommendations:   Not as lethargic as last visit.   Needs assistance walking.    Continue Spravato treatment. Pt did not have any questions or concerns today.  Will continue to f/u with Lorene Macintosh MD for medication management.      Redell A. Annalyssa Thune, NP      There are no diagnoses linked to this encounter.   Please see After Visit Summary for  patient specific instructions.  Future Appointments  Date Time Provider Department Center  12/10/2023  9:00 AM CP-NURSE CP-CP None  12/10/2023  9:00 AM Teresa Redell A, NP CP-CP None  01/30/2024  3:00 PM Tat, Asberry RAMAN, DO [facility] None  04/02/2024  8:00 AM Gilgannon, Chloe N, PT [facility]-NR O[facility]CNR    No orders of the defined types were placed in this encounter.   -------------------------------

## 2023-12-10 ENCOUNTER — Ambulatory Visit: Admitting: Behavioral Health

## 2023-12-10 ENCOUNTER — Ambulatory Visit

## 2023-12-10 VITALS — BP 128/67 | HR 66

## 2023-12-10 DIAGNOSIS — F339 Major depressive disorder, recurrent, unspecified: Secondary | ICD-10-CM

## 2023-12-10 NOTE — Progress Notes (Signed)
 Eric [name] [id number] 1950-07-28 73 y.o.  Subjective:   Patient ID:  Eric [name] is a 73 y.o. (DOB Aug 29, 1950) male.  Chief Complaint: No chief complaint on file.   HPI Eric [name]. Parcel presents to the office today for follow-up of  treatment resistant depression (TRD).   Spravato treatment    Patient was administered Spravato 84mg  intranasally today. Patient was observed by provider throughout Spravato treatment. The patient experienced the typical dissociation which gradually resolved over the 2-hour period of observation. There were no complications. Specifically, the patient did not have any untoward side effects - feeling disconnected from themself, their thoughts, feelings and things around them, dizziness, nausea, feeling sleepy, decreased feeling of sensitivity (numbness) spinning sensation, feeling anxious, lack of energy, increased blood pressure, feeling happy or very excited, or headache. Blood pressures remained within normal ranges at the 40-minute and 2-hour follow-up intervals. By the time the 2-hour observation period was met the patient was alert and oriented and able to exit without assistance. Patient willing to continue Spravato administration for the treatment of resistant depression.    Reports Depression 2/10 today. Feels like depression is starting to improve. Pt is still cognitively impaired and drowsy at the 2 hour mark after reducing dose of Sprivato. Pt was calm and collected today.  Wife is her to provide transportation.  No question or concerns this visit.     ECT-MADRS    Flowsheet Row Office Visit from 06/18/2023 in [facility] Office Visit from 04/16/2023 in [facility]  MADRS Total Score 37 36   PHQ2-9    Flowsheet Row Office Visit from 03/15/2020 in [facility] Total Score 3  PHQ-9 Total Score 9     Review of Systems:   Review of Systems  Constitutional: Negative.   Allergic/Immunologic: Negative.   Neurological: Negative.     Medications: I have reviewed the patient's current medications.  Current Outpatient Medications  Medication Sig Dispense Refill  . ALPRAZolam  (XANAX ) 0.25 MG tablet Take 1 tablet (0.25 mg total) by mouth 2 (two) times daily as needed for anxiety or sleep. 30 tablet 1  . aspirin 81 MG tablet Take 81 mg by mouth daily.    . Cholecalciferol (VITAMIN D-3) 5000 units TABS Take 5,000 Units by mouth daily.     . DULoxetine  (CYMBALTA ) 30 MG capsule Take 3 capsules (90 mg total) by mouth daily. 270 capsule 0  . methylphenidate  (CONCERTA ) 27 MG PO CR tablet Take 1 tablet (27 mg total) by mouth every morning. 30 tablet 0  . naltrexone  (DEPADE) 50 MG tablet Take 0.5 tablets (25 mg total) by mouth daily. 45 tablet 1  . pregabalin  (LYRICA ) 50 MG capsule Take 1 capsule (50 mg total) by mouth at bedtime. 90 capsule 0  . simvastatin (ZOCOR) 20 MG tablet Take 20 mg by mouth at bedtime.    . ZEPBOUND  10 MG/0.5ML Pen Inject 10 mg into the skin once a week.     No current facility-administered medications for this visit.    Medication Side Effects: None  Allergies:  Allergies  Allergen Reactions  . E.E.S. [Erythromycin] Hives  . Macrolides And Ketolides Other (See Comments)    EES   . Rosuvastatin     Other reaction(s): cramps    Past Medical History:  Diagnosis Date  . Allergy   . Anemia    iron- pt denies   . Anxiety   .  Aortic cusp regurgitation   . Carotid artery occlusion   . Constipation   . Coronary artery stenosis   . Hyperlipidemia   . Lactose intolerance   . Major depression, recurrent, chronic   . Obesity   . OCD (obsessive compulsive disorder)   . OSA (obstructive sleep apnea)   . Other chronic pain   . Periodic limb movement disorder   . Periodic limb movements of sleep   . Prediabetes   . Pure hypercholesterolemia   . Restless legs   . Sleep apnea    wears  cpap   . Vitamin D deficiency     Past Medical History, Surgical history, Social history, and Family history were reviewed and updated as appropriate.   Please see review of systems for further details on the patient's review from today.   Objective:   Physical Exam:  There were no vitals taken for this visit.  Physical Exam  Lab Review:  No results found for: NA, K, CL, CO2, GLUCOSE, BUN, CREATININE, CALCIUM, PROT, ALBUMIN, AST, ALT, ALKPHOS, BILITOT, GFRNONAA, GFRAA  No results found for: WBC, RBC, HGB, HCT, PLT, MCV, MCH, MCHC, RDW, LYMPHSABS, MONOABS, EOSABS, BASOSABS  No results found for: POCLITH, LITHIUM    No results found for: PHENYTOIN, PHENOBARB, VALPROATE, CBMZ   .res Assessment: Plan:    Recommendations:   Very sleep today.  Needs assistance walking.    Continue Spravato treatment. Pt did not have any questions or concerns today.  Will continue to f/u with Lorene Macintosh MD for medication management.      [name], NP       Diagnoses and all orders for this visit:  Recurrent major depression resistant to treatment     Please see After Visit Summary for patient specific instructions.  Future Appointments  Date Time Provider Department Center  01/30/2024  3:00 PM Tat, Asberry RAMAN, DO [facility] None  04/02/2024  8:00 AM Gilgannon, Chloe N, PT [facility]-NR O[facility]NR    No orders of the defined types were placed in this encounter.   -------------------------------

## 2023-12-10 NOTE — Progress Notes (Signed)
 NURSES NOTE:         Pt arrived for his #32 Spravato Treatment for treatment resistant depression, the starting dose was 56 mg (2 of the 28 mg nasal sprays). Pt is getting 84 mg again today after receiving 3 of the 56 mg the past 3 weeks due to sedation.  Eric [name] is a patient of Dr. Calhoun so he will follow his care throughout treatments and follow ups. Pt's Spravato is a medical authorization through buy and bill.  Spravato medication is stored at treatment center per REMS/FDA guidelines. The medication is required to be locked behind two doors per REMS/FDA protocol. Medication is also disposed of properly after each use per regulations. All documentation for REMS is completed and submitted per FDA/REMS requirements.          Began taking patient's vital signs at 9:18 AM 133/85, pulse 67, SpO2 95%. Instructed patient to blow his nose if needed then recline back to a 45 degree angle. Gave patient first dose 28 mg nasal spray, administered in each nostril as directed and observed by nurse, waited 5 more minutes for the second and third dose.  After all doses given pt did not complain of any nausea/vomiting. Pt was given water, snacks, and candy. Assessed his 40 minute vitals, 10:10 AM, 129/80, pulse 82, SpO2 95%. Explained he would be monitored for a total time of 120 minutes. Discharge vitals were taken at 11:28 AM 128/67, P 66, SpO2 96%. Eric Pizza, NP met with pt and his wife to discuss his treatment once his thoughts were clearer. Recommend he go home and sleep or just relax on the couch. No driving, no intense activities. Verbalized understanding. Nurse was with pt a total of 60 minutes for clinical assessment. Pt's wife picks him up after treatment. Instructed to call with any issues. Pt will return next Tuesday.   (84 mg) LOT [id number] EXP AUG 2028

## 2023-12-13 ENCOUNTER — Telehealth: Payer: Self-pay

## 2023-12-13 ENCOUNTER — Telehealth: Payer: Self-pay | Admitting: Psychiatry

## 2023-12-13 DIAGNOSIS — G2581 Restless legs syndrome: Secondary | ICD-10-CM

## 2023-12-13 DIAGNOSIS — G8929 Other chronic pain: Secondary | ICD-10-CM

## 2023-12-13 DIAGNOSIS — G4710 Hypersomnia, unspecified: Secondary | ICD-10-CM

## 2023-12-13 NOTE — Telephone Encounter (Signed)
 Pt needs rf of Pregabail. States he only has enough for tonight.    [facility] [address]

## 2023-12-13 NOTE — Telephone Encounter (Signed)
 Pended

## 2023-12-16 ENCOUNTER — Other Ambulatory Visit: Payer: Self-pay | Admitting: Psychiatry

## 2023-12-16 DIAGNOSIS — G8929 Other chronic pain: Secondary | ICD-10-CM

## 2023-12-16 DIAGNOSIS — G4710 Hypersomnia, unspecified: Secondary | ICD-10-CM

## 2023-12-16 DIAGNOSIS — G2581 Restless legs syndrome: Secondary | ICD-10-CM

## 2023-12-16 MED ORDER — PREGABALIN 50 MG PO CAPS: 50.0000 mg | ORAL_CAPSULE | Freq: Every evening | ORAL | 0 refills | Status: DC

## 2023-12-16 NOTE — Telephone Encounter (Signed)
 Pt is calling again about status of med rf.

## 2023-12-16 NOTE — Telephone Encounter (Signed)
 Pt called at 9:36a stating he is completely out of the Pregabalin  he called in for on Friday.    Next appt is for Spravato tomorrow, 11/25

## 2023-12-16 NOTE — Telephone Encounter (Signed)
 Was pended to Dr. Geoffry on Friday.

## 2023-12-17 ENCOUNTER — Ambulatory Visit: Admitting: Psychiatry

## 2023-12-17 ENCOUNTER — Ambulatory Visit

## 2023-12-17 VITALS — BP 132/75 | HR 68

## 2023-12-17 DIAGNOSIS — R7989 Other specified abnormal findings of blood chemistry: Secondary | ICD-10-CM

## 2023-12-17 DIAGNOSIS — F422 Mixed obsessional thoughts and acts: Secondary | ICD-10-CM

## 2023-12-17 DIAGNOSIS — G8929 Other chronic pain: Secondary | ICD-10-CM

## 2023-12-17 DIAGNOSIS — F339 Major depressive disorder, recurrent, unspecified: Secondary | ICD-10-CM

## 2023-12-17 DIAGNOSIS — F900 Attention-deficit hyperactivity disorder, predominantly inattentive type: Secondary | ICD-10-CM

## 2023-12-17 DIAGNOSIS — G2581 Restless legs syndrome: Secondary | ICD-10-CM

## 2023-12-17 DIAGNOSIS — G3184 Mild cognitive impairment, so stated: Secondary | ICD-10-CM

## 2023-12-17 DIAGNOSIS — G4710 Hypersomnia, unspecified: Secondary | ICD-10-CM

## 2023-12-18 ENCOUNTER — Other Ambulatory Visit: Payer: Self-pay | Admitting: Psychiatry

## 2023-12-18 DIAGNOSIS — G2581 Restless legs syndrome: Secondary | ICD-10-CM

## 2023-12-18 DIAGNOSIS — G8929 Other chronic pain: Secondary | ICD-10-CM

## 2023-12-18 DIAGNOSIS — G4710 Hypersomnia, unspecified: Secondary | ICD-10-CM

## 2023-12-18 MED ORDER — PREGABALIN 50 MG PO CAPS: 50.0000 mg | ORAL_CAPSULE | Freq: Every evening | ORAL | 0 refills | Status: DC

## 2023-12-18 NOTE — Telephone Encounter (Signed)
 Eric [name] called in at 4:50pm today to request we transfer the Pregabalin  from [facility] to [facility] [address]. He said he called at 1pm and LM but there is nothing noted. Can we transfer the RX?  [facility]  [address]  [place] [place]

## 2023-12-18 NOTE — Progress Notes (Signed)
 NURSES NOTE:         Pt arrived for his #33 Spravato Treatment for treatment resistant depression, the starting dose was 56 mg (2 of the 28 mg nasal sprays). Pt is getting 84 mg again today after receiving 3 of the 56 mg the past 3 weeks due to sedation.  Eric [name] is a patient of Dr. Calhoun so he will follow his care throughout treatments and follow ups. Pt's Spravato is a medical authorization through buy and bill.  Spravato medication is stored at treatment center per REMS/FDA guidelines. The medication is required to be locked behind two doors per REMS/FDA protocol. Medication is also disposed of properly after each use per regulations. All documentation for REMS is completed and submitted per FDA/REMS requirements.          Began taking patient's vital signs at 9:16 AM 135/73, pulse 81, SpO2 95%. Instructed patient to blow his nose if needed then recline back to a 45 degree angle. Gave patient first dose 28 mg nasal spray, administered in each nostril as directed and observed by nurse, waited 5 more minutes for the second and third dose.  After all doses given pt did not complain of any nausea/vomiting. Pt was given water, snacks, and candy. Assessed his 40 minute vitals, 10:54 AM, 128/68, pulse 66, SpO2 95%. Explained he would be monitored for a total time of 120 minutes. Discharge vitals were taken at 11:36 AM 132/75, P 68, SpO2 96%. Dr. Geoffry met with pt and his wife to discuss his treatment once his thoughts were clearer. Recommend he go home and sleep or just relax on the couch. No driving, no intense activities. Verbalized understanding. Nurse was with pt a total of 60 minutes for clinical assessment. Pt's wife picks him up after treatment. Instructed to call with any issues. Pt will return next Tuesday.   (84 mg) LOT [id number] EXP AUG 2028

## 2023-12-18 NOTE — Telephone Encounter (Signed)
 Rx sent to [facility] [address]

## 2023-12-18 NOTE — Telephone Encounter (Signed)
 Reached out to pt's wife to confirm transfer. Pt was just in office yesterday for treatment and never requested a change in pharmacy.

## 2023-12-24 ENCOUNTER — Ambulatory Visit: Admitting: Behavioral Health

## 2023-12-24 ENCOUNTER — Ambulatory Visit

## 2023-12-24 ENCOUNTER — Encounter: Payer: Self-pay | Admitting: Psychiatry

## 2023-12-24 VITALS — BP 129/72 | HR 66

## 2023-12-24 DIAGNOSIS — F339 Major depressive disorder, recurrent, unspecified: Secondary | ICD-10-CM

## 2023-12-24 MED ORDER — VITAMIN D-3 125 MCG (5000 UT) PO TABS: 5000.0000 [IU] | ORAL_TABLET | Freq: Every day | ORAL | 1 refills | Status: AC

## 2023-12-24 NOTE — Progress Notes (Signed)
 Eric [name] [id number] 1950-04-21 73 y.o.  Subjective:   Patient ID:  Eric [name] is a 73 y.o. (DOB 05/30/1950) male.  Chief Complaint: No chief complaint on file.   HPI Eric [name] presents to the office today for follow-up of  treatment resistant depression (TRD).   Spravato treatment    Patient was administered Spravato 84mg  intranasally today. Patient was observed by provider throughout Spravato treatment. The patient experienced the typical dissociation which gradually resolved over the 2-hour period of observation. There were no complications. Specifically, the patient did not have any untoward side effects - feeling disconnected from themself, their thoughts, feelings and things around them, dizziness, nausea, feeling sleepy, decreased feeling of sensitivity (numbness) spinning sensation, feeling anxious, lack of energy, increased blood pressure, feeling happy or very excited, or headache. Blood pressures remained within normal ranges at the 40-minute and 2-hour follow-up intervals. By the time the 2-hour observation period was met the patient was alert and oriented and able to exit without assistance. Patient willing to continue Spravato administration for the treatment of resistant depression.    Reports Depression 2/10 today. Feels like depression is starting to improve.  Patient was slightly more alert today.  He states that he had trouble getting his Rx of Lyrica .  Says that he has been taking the 100 mg instead of the 50 which he had reduced to.  Says that he feels much better on the 100 mg and would like to continue with this dose.  Wife is her to provide transportation.  No question or concerns this visit.       ECT-MADRS    Flowsheet Row Office Visit from 06/18/2023 in [facility] Office Visit from 04/16/2023 in [facility]  MADRS Total Score 37 36   PHQ2-9    Flowsheet Row Office Visit  from 03/15/2020 in [facility] Total Score 3  PHQ-9 Total Score 9     Review of Systems:  Review of Systems  Constitutional: Negative.   Allergic/Immunologic: Negative.   Neurological: Negative.   Psychiatric/Behavioral:  Positive for dysphoric mood.     Medications: I have reviewed the patient's current medications.  Current Outpatient Medications  Medication Sig Dispense Refill   ALPRAZolam  (XANAX ) 0.25 MG tablet Take 1 tablet (0.25 mg total) by mouth 2 (two) times daily as needed for anxiety or sleep. 30 tablet 1   aspirin 81 MG tablet Take 81 mg by mouth daily.     Cholecalciferol (VITAMIN D-3) 5000 units TABS Take 5,000 Units by mouth daily.      DULoxetine  (CYMBALTA ) 30 MG capsule Take 3 capsules (90 mg total) by mouth daily. 270 capsule 0   methylphenidate  (CONCERTA ) 27 MG PO CR tablet Take 1 tablet (27 mg total) by mouth every morning. 30 tablet 0   naltrexone  (DEPADE) 50 MG tablet Take 0.5 tablets (25 mg total) by mouth daily. 45 tablet 1   pregabalin  (LYRICA ) 50 MG capsule Take 1 capsule (50 mg total) by mouth at bedtime. 90 capsule 0   simvastatin (ZOCOR) 20 MG tablet Take 20 mg by mouth at bedtime.     ZEPBOUND  10 MG/0.5ML Pen Inject 10 mg into the skin once a week.     No current facility-administered medications for this visit.    Medication Side Effects: None  Allergies:  Allergies  Allergen Reactions   E.E.S. [Erythromycin] Hives   Macrolides And Ketolides Other (See Comments)  EES    Rosuvastatin     Other reaction(s): cramps    Past Medical History:  Diagnosis Date   Allergy    Anemia    iron- pt denies    Anxiety    Aortic cusp regurgitation    Carotid artery occlusion    Constipation    Coronary artery stenosis    Hyperlipidemia    Lactose intolerance    Major depression, recurrent, chronic    Obesity    OCD (obsessive compulsive disorder)    OSA (obstructive sleep apnea)    Other chronic  pain    Periodic limb movement disorder    Periodic limb movements of sleep    Prediabetes    Pure hypercholesterolemia    Restless legs    Sleep apnea    wears cpap    Vitamin D deficiency     Past Medical History, Surgical history, Social history, and Family history were reviewed and updated as appropriate.   Please see review of systems for further details on the patient's review from today.   Objective:   Physical Exam:  There were no vitals taken for this visit.  Physical Exam Constitutional:      General: He is not in acute distress.    Appearance: Normal appearance.  Neurological:     Mental Status: He is alert and oriented to person, place, and time.     Gait: Gait normal.  Psychiatric:        Attention and Perception: Attention and perception normal. He does not perceive auditory or visual hallucinations.        Mood and Affect: Mood and affect normal. Mood is not anxious or depressed. Affect is not labile.        Speech: Speech normal.        Behavior: Behavior normal. Behavior is cooperative.        Thought Content: Thought content normal.        Cognition and Memory: Cognition and memory normal.        Judgment: Judgment normal.     Lab Review:  No results found for: NA, K, CL, CO2, GLUCOSE, BUN, CREATININE, CALCIUM, PROT, ALBUMIN, AST, ALT, ALKPHOS, BILITOT, GFRNONAA, GFRAA  No results found for: WBC, RBC, HGB, HCT, PLT, MCV, MCH, MCHC, RDW, LYMPHSABS, MONOABS, EOSABS, BASOSABS  No results found for: POCLITH, LITHIUM    No results found for: PHENYTOIN, PHENOBARB, VALPROATE, CBMZ   .res Assessment: Plan:    Recommendations:   Very sleep today.  Needs assistance walking.   Continue  with Lyrica  100 mg pending Dr. Calhoun approval.    Continue Spravato treatment.  Will continue to f/u with Eric Macintosh MD for medication management.      Eric A. Bryden Darden, NP      Diagnoses and  all orders for this visit:  Recurrent major depression resistant to treatment     Please see After Visit Summary for patient specific instructions.  Future Appointments  Date Time Provider Department Center  01/30/2024  3:00 PM Tat, Asberry RAMAN, DO [facility] None  04/02/2024  8:00 AM Gilgannon, Chloe N, PT [facility]    No orders of the defined types were placed in this encounter.   -------------------------------

## 2023-12-24 NOTE — Progress Notes (Signed)
 NURSES NOTE:         Pt arrived for his #34 Spravato Treatment for treatment resistant depression, the starting dose was 56 mg (2 of the 28 mg nasal sprays). Pt is getting 84 mg again today after receiving 3 of the 56 mg the past 3 weeks due to sedation.  Eric [name] is a patient of Dr. Calhoun so he will follow his care throughout treatments and follow ups. Pt's Spravato is a medical authorization through buy and bill.  Spravato medication is stored at treatment center per REMS/FDA guidelines. The medication is required to be locked behind two doors per REMS/FDA protocol. Medication is also disposed of properly after each use per regulations. All documentation for REMS is completed and submitted per FDA/REMS requirements.          Began taking patient's vital signs at 9:10 AM 129/82, pulse 66, SpO2 96%. Instructed patient to blow his nose if needed then recline back to a 45 degree angle. Gave patient first dose 28 mg nasal spray, administered in each nostril as directed and observed by nurse, waited 5 more minutes for the second and third dose.  After all doses given pt did not complain of any nausea/vomiting. Pt was given water, snacks, and candy. Assessed his 40 minute vitals, 9:58 AM, 136/72, pulse 65, SpO2 95%. Explained he would be monitored for a total time of 120 minutes. Discharge vitals were taken at 11:08 AM 129/72, P 66, SpO2 96%. Eric Pizza, NP met with pt and his wife to discuss his treatment once his thoughts were clearer. Recommend he go home and sleep or just relax on the couch. No driving, no intense activities. Verbalized understanding. Nurse was with pt a total of 60 minutes for clinical assessment. Pt's wife picks him up after treatment. Instructed to call with any issues. Pt will return next Tuesday.   (84 mg) LOT [id number] EXP AUG 2028

## 2023-12-24 NOTE — Progress Notes (Signed)
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 too much bc OCD.  Trying to decide about how to deal with a trigger lately.  OCD driving me crazy wanting to make things perfect.   Other than the trigger had a nice time since here.  GS here for 5 days at 73 yo.  Enjoyed that.   Taking semaglutide   down to 250# from 283#. Card at [facility] says he does not have Aortic aneurysm.  Measuring is OK.  Will FU with MRI in June.   Some diarrhea lately. Cannot stop worrying.  Triggered by the plumbing issue. Meds as above.  No SE of sig.   Plan:  switched to duloxetine  to 90 mg daily.  Off Lexapro .  Others as noted. Lyrica  100 mg HS. Has held Ritalin  10 TID, can resume as long as SBP below 140 per card.  none needed ropinirole  3 mg pm.  No risperidone .  02/18/23 appt noted:  with [name] Meds: as above. Taking Ritalin  30% of night.  Prn Xanax  rarely if gets off sleep cycle. No SE.   Terribly dep but not as bad as in the past. Taking Ritalin  appears to help significantly and started doing that.  Tend to stay in bed till noon but pattern of up late.   OCD about the same with some avoidance of detail work.  Afraid I'll  throw away something I need.   She doesn't know whether Ritalin  helps No sig RLS and wife agrees. Thinks of deceased B at the holidays but worries over son Deward too.   Plan: Increase duloxetine  to 120 mg daily.  Off Lexapro .  Others as noted. Lyrica  100 mg HS.  resumed Ritalin  30 AM with some benefit. no risperidone .  03/19/23 appt noted: Psych  med:  duloxetine  120, Ritalin  20 am  missing doses, Lyrica  100 HS, no risperidone , no ropinirole .   No improvement in depression since increase dose duloxetine .  More dep than usual.  Worrying about everything.  Including taxes.  All I can see is a black hole.  Making him feel negative about everything.   Keeping him from doing things.   OCD is not much different from when on Lexapro .  Wife agrees dep and distracted. Plan: resumed Ritalin  30 AM with some benefit. Resume Abilify  2 mg AM for dep.  04/16/23 appt noted:  wife here Psych med:  duloxetine  120, resumed Concerta  36 mg AM, Lyrica  100 HS, Abilify  2, no ropinirole .   Wife noted he seemed manic yesterday.  Invited window estimate against wife's will and without her input.   No mania noted until yesterday. He didn't feel manic yesterday.   He's noticed no change with Abilify  and still feels flat and a little low.  From [facility]PH energy and mood a little better.  Sleeps until 10-11 am about 12 hours. And asleep that whole time. Wife says he doesn't eat much bc he's not awake much. Trouble with hygiene.   Plan: Meds: continue duloxetine  to 120 mg daily.  Off Lexapro .  Lyrica  100 mg HS.  resumed Ritalin  30 AM with some benefit. DC Abiilify Vraylar  1.5 mg every other day Spravato disc in detail.  He wants to pursue if not better with Vraylar .  06/18/23 appt noted: with W Med:  duloxetine  120, resumed Concerta  36 mg AM, Lyrica  100 HS, no ropinirole .   Vraylar  1.5 every other day, no benefit Spravato denied by insurance but is being appealed. More trouble walking and shorter gait.  Neuro in [place] appt  in Sept.   Looking to get in in [place].  Can't be much more dep than I am.   OCD residual when has to make a decision.   ED is more of a px. Reduced voice family. Plan : Spravato  06/25/23 appt noted: with W Med:  duloxetine  120, Concerta  36 mg AM, Lyrica  100 HS, no ropinirole .   Vraylar  1.5 daily No SE Received Spravato 56 mg today.  Experienced very mild dissociation and no sig HA, N.  But some dizziness.  Resolved by end of 2 hours observation.  Able to leave office without assistance.  Ongoing dep without change.  Slow, reduced cognition, anhedonia, forgetful, low motivation. No other concerns with meds.   06/27/23 appt noted:  Med: Med:  duloxetine  120, Concerta  36 mg AM, Lyrica  100 HS, no ropinirole .   Stopped Vraylar  1.5 daily No SE Received Spravato 84 mg todayfor the first time..  Experienced very mild dissociation and no sig HA, N.  But some dizziness.  Resolved by end of 2 hours observation.  Able to leave office without assistance.  Ongoing dep without change.  Slow, reduced cognition, anhedonia, forgetful, low motivation. No other concerns with meds.   07/02/23 appt oted: Med: Med:  duloxetine  90, Concerta  36 mg AM, Lyrica  100 HS, no ropinirole .   Stopped Vraylar  1.5 daily No SE Received Spravato 84 mg today.  Experienced very mild dissociation and no sig HA, N.  But some dizziness.  Resolved by end of 2 hours observation.  Mildly drowsy at end of session. BC of gait px he needed to use WC to leave the office.  Per wife gait gradually worsened over a couple of years but accelerated recently in 3 mos or so.  Short gait.  Not  due to pain.  Pending neuro appt.  MRI last week not read yet. No problems with meds. Wife noticed he's shown more initiative at home since [place] ex doing crossword puzzles.  More engaged.  He feels lighter already with it.  07/15/23 appt noted:  Med: Med:  duloxetine  90, Concerta  36 mg AM, Lyrica  100 HS, no ropinirole .  No SE Received Spravato 84 mg  today.  Experienced very mild dissociation and no sig HA, N.  But some dizziness.  Resolved by end of 2 hours observation.  Mildly drowsy at end of session. BC of gait px he needed to use WC to leave the office. Seen with W.   W noted clear benefit Spravato with stronger voice, spontaneous chores he wasn't doing.  More engaged in coverstation.  Pt agrees. Disc concerns over gait px and his neuro visit and MRI scan suggesting Fahr's DZ.  He'll discuss further with DR. Tat tomorrow.  07/18/23 appt noted:  Med: Med:  duloxetine  90, Concerta  36 mg AM, Lyrica  100 HS, no ropinirole .  No SE Received Spravato 84 mg today.  Experienced very mild dissociation and no sig HA, N.  But some dizziness.  Resolved by end of 2 hours observation.  Mildly drowsy at end of session. BC of gait px he needed to use WC to leave the office. No med concerns.  Seen with W.    Both agree dep is better .  More affective range.  More socially engaged.  Quicker thought.  More active .  Less down and less negative.  07/23/23 appt noted: Med: Med:  duloxetine  90, Concerta  36 mg AM, Lyrica  100 HS, no ropinirole .  No SE Received Spravato 84 mg today.  Experienced very mild dissociation and no sig HA, N.  But some dizziness.  Resolved by end of 2 hours observation.  Mildly drowsy at end of session. BC of gait px he needed to use WC to leave the office. No med concerns.  Seen with W.    Dep is still improved.  But is dealing with poor gait, weak and slow.  Will start neurorehab today.   Anxiety re: OCD manageable. Thought and affect quicker and more responsive .  Humor returned.  07/25/23 appt:  Med: Med:  duloxetine  90, Concerta  36 mg AM, Lyrica  100 HS, no ropinirole .  No SE Received Spravato 84 mg today.  Experienced very mild dissociation and no sig HA, N.  But some dizziness.  Resolved by end of 2 hours observation.  Mildly drowsy at end of session. BC of gait px he needed to use WC to leave the office. No med concerns.   Seen with W.    Overall mood still improving but not 100%.  Starting neurorehab for weakness.  No new med concerns.  Satisfied with meds.  07/30/23 appt noted:  Med: Med:  duloxetine  90, Concerta  36 mg AM, Lyrica  100 HS, no ropinirole .  No SE Received Spravato 84 mg today.  Experienced very mild dissociation and no sig HA, N.  But some dizziness.  Resolved by end of 2 hours observation.  Mildly drowsy at end of session. BC of gait px he needed to use WC to leave the office. No med concerns.  Seen with W.    Both agree mood is improving and activity and interest.  Not 100% but limited by mobility and starting PT.    08/15/23 appt noted:  Med:  duloxetine  90, Concerta  36 mg AM, Lyrica  100 HS, no ropinirole .  No SE Received Spravato 56 mg today.  Experienced very mild dissociation and no sig HA, N.  But some dizziness.  Resolved by end of 2 hours observation.  Mildly drowsy at end of session.  But less than when on 84 mg. BC of gait px he needed to use WC to leave the office. No med concerns.  Seen with W.    Less drowsy at  end of time here than when got 84 mg daily. They both agree good response with dep and anxiety.  More involved and better interaction socially.  More initiative.  08/20/23 appt noted:  Med:  duloxetine  90, Concerta  36 mg AM, Lyrica  100 HS, no ropinirole .  No SE Received Spravato 56 mg today.  Experienced very mild dissociation and no sig HA, N.  But some dizziness.  Resolved by end of 2 hours observation.  Mildly drowsy at end of session.  But less than when on 84 mg. BC of gait px he needed to use WC to leave the office. No med concerns.  Seen with W.   She's still concerned he sleeps too much.  He's not sure why he does so.  Overall mood, initiative, socialization is better. Less drowsy at  end of time here than when got 84 mg daily.  08/27/23 appt noted:  Med:  duloxetine  90, Concerta  36 mg AM, Lyrica  100 HS, no ropinirole .  No SE Received Spravato 84 mg today.   Experienced very mild dissociation and no sig HA, N.  But some dizziness.  Resolved by end of 2 hours observation.  Mildly drowsy at end of session.  Walking is some better with PT.  Wife agrees.  He's more interested, responsive, engaged.  Better cognition. After Spravato 56 he does not have to nap when goes home. After 84 mg needed to nap but he wants to increase back to 84 mg to get max effect. More sedate after 84 mg today but is able to clear by end of session.  But wants to continue it.  Over all doing pretty well  with less dep and more engagement and activity including spontaneously cleaning things.   09/10/23 appt noted:  Med:  duloxetine  90, Concerta  36 mg AM, Lyrica  100 HS, no ropinirole .  No SE Received Spravato 84 mg today.  Experienced very mild dissociation and no sig HA, N.  But some dizziness.  Resolved by end of 2 hours observation.  Mildly drowsy at end of session.  Mood continues to improve overall and anxiety better too.  Walking is improving.  Cognition and emotional engagment, activity are all better. Plan no med changes  09/24/23 appt noted: Med: Med:  duloxetine  90, Concerta  36 mg AM, Lyrica  100 HS, no ropinirole .  No SE Received Spravato 84 mg today.  Experienced very mild dissociation and no sig HA, N.  But some dizziness.  Resolved by end of 2 hours observation.  Mildly drowsy at end of session.  Mood continues to improve overall and anxiety better too.  Walking is improving.  Cognition and emotional engagment, activity are all better. Asks about reducing duloxetine  bc ED issues. Plan: reduce duloxetine  to 60 mg daily to reduce ED  10/08/23 appt Med: Med:  duloxetine  60, Concerta  36 mg AM, Lyrica  100 HS, no ropinirole .  Received Spravato 84 mg today.  Experienced very mild dissociation and no sig HA, N.  But some dizziness.  Resolved by end of 2 hours observation.  Mildly drowsy at end of session.  Some dysphoria  this week but he and wife continue to see improvement  and benefit with meds and Spravato.   SE concerta  feeling jittery and skips it at times.   10/15/23 appt noted:  Med :  duloxetine  60, Concerta  36 mg AM, Lyrica  100 HS, no ropinirole .  Received Spravato 84 mg today.  Experienced very mild dissociation and no sig HA, N.  But some dizziness.  Resolved by end of 2 hours observation.  Mildly drowsy at end of session.  he and wife continue to see improvement and benefit with meds and Spravato.   Minimal depression now. Stressed by nearly scammed this week by caller, but wife stopped. It.  She is concerned bc in the past he would not have been so easily deceived.  However he says they were just good and he ultimately didn't get scammed.  No concerns with current meds. Plan reduce Concerta  to 27 mg AM  10/22/23 appt noted:  Med :  duloxetine  60, Concerta  36 mg AM, Lyrica  100 HS, no ropinirole .  Received Spravato 84 mg today.  Experienced very mild dissociation and no sig HA, N.  But some dizziness.  Resolved by end of 2 hours observation.  Mildly drowsy at end of session.  he and wife continue to see improvement and benefit with meds and Spravato.   Minimal depression now. Hasn't reduced Concerta  yet.   Is wearing a bladder cath bc U retention.   Suspected prostate px in workup.  Mood is still improving.  Will be here next week then going to see GS in [place] for a week. Plan: as noted reduce Concerta  2 AM  10/29/23 appt noted;  Med :  duloxetine  60, Concerta  36 mg AM, Lyrica  100 HS, no ropinirole .  Received Spravato 84 mg today.   Experienced very mild dissociation and no sig HA, N.  But some dizziness.  Resolved by end of 2 hours observation.  Mildly drowsy at end of session.  he and wife continue to see improvement and benefit with meds and Spravato.   Minimal depression now. Hasn't reduced Concerta  yet.   Is wearing a bladder cath bc U retention.   Frustrated over this but gets it out tomorrow. Pretty good with mood. No concerns with meds.   Intermittent compliance with Concerta .   Wife concerns over poor judgment at times.  Like when makes financial decisions while receiving Spravato.  10/31/23 appt noted:  Med :  duloxetine  60, Concerta  36 mg AM, Lyrica  100 HS, no ropinirole .  Received Spravato 84 mg today.   Experienced very mild dissociation and no sig HA, N.  But some dizziness.  Resolved by end of 2 hours observation.  Mildly drowsy at end of session.  he and wife continue to see improvement and benefit with meds and Spravato.   Minimal depression now. Hasn't reduced Concerta  yet.   Is wearing a bladder cath bc U retention.   Frustrated over this. Mood is better markedly with Spravato.  Not as much OCD lately but distracted by health problems.    11/11/23 appt noted:  Med :  duloxetine  60, Concerta  36 mg AM, Lyrica  100 HS, no ropinirole .  Received Spravato 84 mg today.   Experienced very mild dissociation and no sig HA, N.  But some dizziness.  Resolved by end of 2 hours observation.  Mildly drowsy at end of session.  he and wife continue to see improvement and benefit with meds and Spravato.   Minimal depression now. Trip to [facility] to visit  family went well sitting with GS.  Missed a Spravato admin.   Dep remained manageable generally.  Wife indicated he was negative this am but he feels better after Spravato.  Disc concerns abotu gait gradually better.  OCD is better.   Plan reduce Concerta  to 27 mg am  11/26/23 appt noted: Med :  duloxetine  60, Concerta  27 mg AM, Lyrica  100 HS, no ropinirole .  Received Spravato 84 mg today.   Experienced very mild dissociation and no sig HA, N.  But some dizziness.  Resolved by end of 2 hours observation.  Mildly drowsy at end of session.  he and wife continue to see improvement and benefit with meds and Spravato.   Minimal depression now. Done fine with less Concerta .  Mood is better and function better and wife agrees.  Satisfied with meds.   12/17/23 appt noted:  Med :  duloxetine   60, Concerta  27 mg AM, Lyrica  100 HS, no ropinirole .  Received Spravato 84 mg today.   No SE Experienced very mild dissociation and no sig HA, N.  But some dizziness.  Resolved by end of 2 hours observation.  Mildly drowsy at end of session.  he and wife continue to see improvement and benefit with meds and Spravato.   Tendency to do tasks under influence of Spravato against recommendation of staff and his wife's desires.  Minimal depression now.  OCD is still present but less anxiety from it now.  Doing well with meds without concerns.  Strength is not normal but has improved over time.    ECT-MADRS    Flowsheet Row Office Visit from 06/18/2023 in [facility] Office Visit from 04/16/2023 in [facility]  MADRS Total Score 37 36   [id number]    Flowsheet Row Office Visit from 03/15/2020 in [facility] Total Score 3  PHQ-9 Total Score 9     B schizophrenic SUI. After M's death. PCP Cecil Ee at [facility] at 73 years old.  Prior psychiatric medication trials include  Lexapro  20, citalopram NR, clomipramine weight gain, paroxetine, fluoxetine, Luvox, Trintellix,   Increase Lexapro  back to 20 mg January 2020. & 10/2020 bupropion ,  Auvelity NR  Abilify  10 fatigue,  Cerefolin NAC, and   Naltrexone  sexual SE Lyrica  150 tired  pramipexole,  ropinirole  Adderall XR & IR sexual SE,  Ritalin  30, Concerta  72 mg AM NR modafinil  and Nuvigil,   History [employer] OCD  Review of Systems:  Review of Systems  Constitutional:  Positive for fatigue. Negative for fever.  Cardiovascular:  Positive for palpitations. Negative for chest pain.  Genitourinary:  Positive for difficulty urinating.       ED  Musculoskeletal:  Positive for arthralgias, gait problem and myalgias.  Neurological:  Positive for weakness. Negative for dizziness and syncope.   Psychiatric/Behavioral:  Negative for agitation, behavioral problems, confusion, decreased concentration, dysphoric mood, hallucinations, self-injury, sleep disturbance and suicidal ideas. The patient is nervous/anxious. The patient is not hyperactive.     Medications: I have reviewed the patient's current medications.  Current Outpatient Medications  Medication Sig Dispense Refill   ALPRAZolam  (XANAX ) 0.25 MG tablet Take 1 tablet (0.25 mg total) by mouth 2 (two) times daily as needed for anxiety or sleep. 30 tablet 1   aspirin 81 MG tablet Take 81 mg by mouth daily.     Cholecalciferol (VITAMIN D-3) 125 MCG (5000 UT) TABS Take 1 tablet (5,000  Units total) by mouth daily. 90 tablet 1   DULoxetine  (CYMBALTA ) 30 MG capsule Take 3 capsules (90 mg total) by mouth daily. 270 capsule 0   methylphenidate  (CONCERTA ) 27 MG PO CR tablet Take 1 tablet (27 mg total) by mouth every morning. 30 tablet 0   naltrexone  (DEPADE) 50 MG tablet Take 0.5 tablets (25 mg total) by mouth daily. 45 tablet 1   pregabalin  (LYRICA ) 50 MG capsule Take 1 capsule (50 mg total) by mouth at bedtime. 90 capsule 0   simvastatin (ZOCOR) 20 MG tablet Take 20 mg by mouth at bedtime.     ZEPBOUND  10 MG/0.5ML Pen Inject 10 mg into the skin once a week.     No current facility-administered medications for this visit.    Medication Side Effects: None sexual SE are better not  All gone.  Allergies:  Allergies  Allergen Reactions   E.E.S. [Erythromycin] Hives   Macrolides And Ketolides Other (See Comments)    EES    Rosuvastatin     Other reaction(s): cramps    Past Medical History:  Diagnosis Date   Allergy    Anemia    iron- pt denies    Anxiety    Aortic cusp regurgitation    Carotid artery occlusion    Constipation    Coronary artery stenosis    Hyperlipidemia    Lactose intolerance    Major depression, recurrent, chronic    Obesity    OCD (obsessive compulsive disorder)    OSA (obstructive sleep apnea)     Other chronic pain    Periodic limb movement disorder    Periodic limb movements of sleep    Prediabetes    Pure hypercholesterolemia    Restless legs    Sleep apnea    wears cpap    Vitamin D  deficiency     Family History  Problem Relation Age of Onset   Cancer Mother        breast and ovarian   Anxiety disorder Mother    Breast cancer Mother    Ovarian cancer Mother    Depression Father        bi-polar   Hyperlipidemia Father    Heart disease Father    Sudden death Father    Bipolar disorder Father    Sleep apnea Father    Obesity Father    Depression Son    Colon cancer Neg Hx    Colon polyps Neg Hx    Esophageal cancer Neg Hx    Rectal cancer Neg Hx    Stomach cancer Neg Hx     Social History   Socioeconomic History   Marital status: Married    Spouse name: Not on file   Number of children: Not on file   Years of education: Not on file   Highest education level: Not on file  Occupational History   Occupation: retired pensions consultant  Tobacco Use   Smoking status: Never   Smokeless tobacco: Never  Substance and Sexual Activity   Alcohol use: Yes    Comment: occasionally    Drug use: No   Sexual activity: Not on file  Other Topics Concern   Not on file  Social History Narrative   Not on file   Social Drivers of Health   Financial Resource Strain: Not on file  Food Insecurity: No Food Insecurity (01/25/2022)   Received from [facility] visits prior to 03/24/2022., [facility]   Hunger Vital Sign  Within the past 12 months, you worried that your food would run out before you got the money to buy more.: Never true    Within the past 12 months, the food you bought just didn't last and you didn't have money to get more.: Never true  Transportation Needs: No Transportation Needs (01/25/2022)   Received from [facility], [facility] visits prior to 03/24/2022.   PRAPARE - Administrator, Civil Service  (Medical): No    Lack of Transportation (Non-Medical): No  Physical Activity: Not on file  Stress: Not on file  Social Connections: Not on file  Intimate Partner Violence: Not on file    Past Medical History, Surgical history, Social history, and Family history were reviewed and updated as appropriate.   Please see review of systems for further details on the patient's review from today.   Objective:   Physical Exam:  There were no vitals taken for this visit.  Physical Exam Constitutional:      General: He is not in acute distress.    Appearance: He is obese.  Musculoskeletal:        General: No deformity.  Neurological:     Mental Status: He is alert and oriented to person, place, and time.     Cranial Nerves: No dysarthria.     Motor: Weakness present.     Coordination: Coordination abnormal.     Comments: Shortened gait is better.  No sig tremor.  less Slow but still not normal.  Psychiatric:        Attention and Perception: Attention and perception normal. He does not perceive auditory or visual hallucinations.        Mood and Affect: Mood is anxious. Mood is not depressed. Affect is not labile or blunt.        Speech: Speech normal.        Behavior: Behavior is not slowed. Behavior is cooperative.        Thought Content: Thought content normal. Thought content is not paranoid or delusional. Thought content does not include homicidal or suicidal ideation. Thought content does not include suicidal plan.        Cognition and Memory: Cognition and memory normal.        Judgment: Judgment normal.     Comments: Insight intact Depression manageable. OCD is about the same and not the main problem More expressive and more normal affect.  Quicker responses.    November 06, 2018: [place] Cog test in office within normal limits MMSE 28/30. Animal fluency 17 . (borderline) Taken as a whole, no indication to pursue neuropsychological testing.  Mini-Mental status exam 28/30 on  10/27/20.  No evidence of dementia.  Lab Review:   Vitamin D level acceptable at 54.5.   Normal B12 and folate and TSH in last couple of years..  Echocardiogram is stable re: AVR over the last 8 years and not likely the cause of lethargy.   06/28/23 MRI head:  IMPRESSION: 1. No acute intracranial abnormality. 2. 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 Head CT may be helpful for further assessment of the degree of mineralization. 3. Findings of chronic small vessel ischemia.  SABRAres Assessment: Plan:    Jaze was seen today for follow-up, depression, anxiety and fatigue.  Diagnoses and all orders for this visit:  Recurrent major depression resistant to treatment  Hypersomnia with sleep apnea  Restless legs syndrome  Other chronic pain  Mixed obsessional thoughts and acts  Attention deficit hyperactivity disorder (ADHD), predominantly inattentive type  Mild cognitive impairment  Low vitamin D level -     Cholecalciferol (VITAMIN D-3) 125 MCG (5000 UT) TABS; Take 1 tablet (5,000 Units total) by mouth daily.    Mr. Gaspard has a long history of depression and OCD.  Major depression with fatigue, cognitive and physical slowness, anhedonia is much worse.  Started Spravato  and improving.  Was better energy with duloxetine  90 vs Lexapro  so increased to 120 mg daily DT worsening depression.    But it was not better. So reduced it back to 90 mg daily.    Now reduced to 60 mg daily to see if ED better and also bc recent U retention.  (Not likely related) now dep bettter with Spravato. Disc risk worsening dep and anxiety.   He has some compulsive checking and obsessions around the house maintenance.  When travels then tends to have less OCD bc triggered less.    Received 84 mg Spravato today. The patient experienced the typical dissociation which gradually  resolved over the 2-hour period of observation.  There were no complications.  Specifically the patient did not have nausea or vomiting or headache.  Blood pressures remained within normal ranges at the 40-minute and 2-hour follow-up intervals.  By the time the 2-hour observation period was met the patient was alert and oriented and able to exit without assistance.  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  See nursing note for further details. Emphasized need to relax with Spravato and not return calls, read, return chats, emails etc. Started Spravato 06/25/23. continue weekly @ 84 mg Strongly rec avoid making any decisions nor dealing with any verbal information during the Spravato procedure.  He is not compliant so far with this.  Disc in detail with him and his wife.  His OCD is chronic with checking lites, stove, etc. .  It is better at the moment DT depression being worse.  Disc CBT techniques and potential for more therapy to address. Option Dr. Marijean.  Concerta  to 27  mg AM to reduce SE.   He wants to use it for depression and cognitive concerns.  Discussed potential benefits, risks, and side effects of stimulants with patient to include increased heart rate, palpitations, insomnia, increased anxiety, increased irritability, or decreased appetite.  Instructed patient to contact office if experiencing any significant tolerability issues.  Extensive discussion of sleep study and he has a copy.  Why is there virtually no N3 & REM sleep?  Is that affecting daytime alertness and fatigue?  Vs how much is related to mild OSA and PLMS?  Disc this in detail.  Wrote correspondence with sleep doc about it.  Options for sleep & fatigue:  Difficult to assess  bc has been [place] of [place] and then sick for 3 weeks.   Focus on reduction in OSA Focus on improving deep stage sleep.  ? Rx low dose mirtazapine or  alternatives Focus on leg movements (hx RLS/PLMS) continue Trial as had been suggested Lyrica  for FM type sx and may help RLS/PLMS.  Better dreaming on 100 mg HS and too sleepy on 150.  Decreased  to 100 mg HS for sleep and back pain and RLS No ropinirole  needed.   Disc SE.  Not having any.   Use LED Xanax  and try to avoid BZ daytime bc  fatigue.  He doesn't use it much daythime.  Using 0..25-0.5 mg at night.  May need less with more Lyrica  at [employer] and try to minimize.  No hangover. We discussed the short-term risks associated with benzodiazepines including sedation and increased fall risk among others.  Discussed long-term side effect risk including dependence, potential withdrawal symptoms, and the potential eventual dose-related risk of dementia.  But recent studies from 2020 dispute this association between benzodiazepines and dementia risk. Newer studies in 2020 do not support an association with dementia.  Stimulant partially successfully used off label to augment antidepressants for depression and have resulted in improved productivity and attention.    Previous screening of memory was not suggestive of any neuro degenerative process: Mini-Mental status exam 28/30 on 10/27/20.   Recent cognition worse as dep worsened.  Also gait is short and slow.   neuro appt.  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  Disc Dr. Collie note and question of whether mobility px related to hx Vraylar .  No AIM noted but reports at times with have invol tongue protrusion.  Not uncomfortable.  Plan:  duloxetine  60 mg daily to reduce ED  Lyrica  100 mg HS.   Continue [facility] ER to 27 mg AM,    Spravato disc in detail.  He wants to continue.  He and wife notice he's better.  Administer now drop back to weekly  He's still improving.   He is tolerating it better  Follow-up   Lorene Macintosh MD, DFAPA.  Please see After Visit Summary for patient specific instructions.  Future Appointments  Date Time Provider  Department Center  12/31/2023  9:00 AM Cottle, Lorene KANDICE Raddle., MD [facility] None  12/31/2023  9:00 AM CP-NURSE [facility] None  01/07/2024  9:00 AM CP-NURSE [facility] None  01/07/2024  9:00 AM Teresa Rogue A, NP [facility] None  01/30/2024  3:00 PM Tat, Asberry RAMAN, DO [facility] None  04/02/2024  8:00 AM Gilgannon, Chloe N, PT [facility]     No orders of the defined types were placed in this encounter.      -------------------------------

## 2023-12-26 DIAGNOSIS — G4733 Obstructive sleep apnea (adult) (pediatric): Secondary | ICD-10-CM | POA: Diagnosis not present

## 2023-12-31 ENCOUNTER — Encounter: Payer: Self-pay | Admitting: Psychiatry

## 2023-12-31 ENCOUNTER — Ambulatory Visit: Admitting: Psychiatry

## 2023-12-31 ENCOUNTER — Ambulatory Visit

## 2023-12-31 ENCOUNTER — Encounter: Admitting: Psychiatry

## 2023-12-31 ENCOUNTER — Encounter

## 2023-12-31 VITALS — BP 119/68 | HR 68

## 2023-12-31 DIAGNOSIS — F339 Major depressive disorder, recurrent, unspecified: Secondary | ICD-10-CM

## 2023-12-31 DIAGNOSIS — G2581 Restless legs syndrome: Secondary | ICD-10-CM

## 2023-12-31 DIAGNOSIS — G4730 Sleep apnea, unspecified: Secondary | ICD-10-CM

## 2023-12-31 DIAGNOSIS — F422 Mixed obsessional thoughts and acts: Secondary | ICD-10-CM

## 2023-12-31 DIAGNOSIS — F331 Major depressive disorder, recurrent, moderate: Secondary | ICD-10-CM

## 2023-12-31 DIAGNOSIS — F900 Attention-deficit hyperactivity disorder, predominantly inattentive type: Secondary | ICD-10-CM

## 2023-12-31 DIAGNOSIS — G3184 Mild cognitive impairment, so stated: Secondary | ICD-10-CM

## 2023-12-31 NOTE — Progress Notes (Signed)
 NURSES NOTE:         Pt arrived for his #35 Spravato Treatment for treatment resistant depression, the starting dose was 56 mg (2 of the 28 mg nasal sprays). Pt is getting 84 mg again today after receiving 3 of the 56 mg the past 3 weeks due to sedation.  Eric [name] is a patient of Dr. Calhoun so he will follow his care throughout treatments and follow ups. Pt's Spravato is a medical authorization through buy and bill.  Spravato medication is stored at treatment center per REMS/FDA guidelines. The medication is required to be locked behind two doors per REMS/FDA protocol. Medication is also disposed of properly after each use per regulations. All documentation for REMS is completed and submitted per FDA/REMS requirements.          Began taking patient's vital signs at 11:10 AM 135/65, pulse 62, SpO2 96%. Instructed patient to blow his nose if needed then recline back to a 45 degree angle. Gave patient first dose 28 mg nasal spray, administered in each nostril as directed and observed by nurse, waited 5 more minutes for the second and third dose.  After all doses given pt did not complain of any nausea/vomiting. Pt was given water, snacks, and candy. Assessed his 40 minute vitals, 11:50 AM, 128/67, pulse 62, SpO2 96%. Explained he would be monitored for a total time of 120 minutes. Discharge vitals were taken at 12:57 PM 119/68, P 68, SpO2 95%. Dr. Geoffry met with pt and his wife to discuss his treatment once his thoughts were clearer. Recommend he go home and sleep or just relax on the couch. No driving, no intense activities. Verbalized understanding. Nurse was with pt a total of 60 minutes for clinical assessment. Pt's wife picks him up after treatment. Instructed to call with any issues. Pt will return next Tuesday.   (84 mg) LOT [id number] EXP AUG 2028

## 2023-12-31 NOTE — Progress Notes (Unsigned)
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 noted: Worrying too much bc OCD.  Trying to decide about how to deal with a trigger lately.  OCD driving me crazy wanting to make things perfect.   Other than the trigger had a nice time since here.  GS here for 5 days at 73 yo.  Enjoyed that.   Taking semaglutide   down to 250# from 283#. Card at [facility] says he does not have Aortic aneurysm.  Measuring is OK.  Will FU with MRI in June.   Some diarrhea lately. Cannot stop worrying.  Triggered by the plumbing issue. Meds as above.  No SE of sig.   Plan:  switched to duloxetine  to 90 mg daily.  Off Lexapro .  Others as noted. Lyrica  100 mg HS. Has held Ritalin  10 TID, can resume as long as SBP below 140 per card.  none needed ropinirole  3 mg pm.  No risperidone .  02/18/23 appt noted:  with [name] Meds: as above. Taking Ritalin  30% of night.  Prn Xanax  rarely if gets off sleep cycle. No SE.   Terribly dep but not as bad as in the past. Taking Ritalin  appears to help significantly and started doing that.  Tend to stay in bed till noon but pattern of up late.   OCD about the same with some avoidance of detail work.   Afraid I'll throw away something I need.   She doesn't know whether Ritalin  helps No sig RLS and wife agrees. Thinks of deceased B at the holidays but worries over son Deward too.   Plan: Increase duloxetine  to 120 mg daily.  Off Lexapro .  Others as noted. Lyrica  100 mg HS.  resumed Ritalin  30 AM with some benefit. no risperidone .  03/19/23 appt noted: Psych med:  duloxetine  120, Ritalin  20 am  missing doses, Lyrica  100 HS, no risperidone , no ropinirole .   No improvement in depression since increase dose duloxetine .  More dep than usual.  Worrying about everything.  Including taxes.  All I can see is a black hole.  Making him feel negative about everything.   Keeping him from doing things.   OCD is not much different from when on Lexapro .  Wife agrees dep and distracted. Plan: resumed Ritalin  30 AM with some benefit. Resume Abilify  2 mg AM for dep.  04/16/23 appt noted:  wife here Psych med:  duloxetine  120, resumed Concerta  36 mg AM, Lyrica  100 HS, Abilify  2, no ropinirole .   Wife noted he seemed manic yesterday.  Invited window estimate against wife's will and without her input.   No mania noted until yesterday. He didn't feel manic yesterday.   He's noticed no change with Abilify  and still feels flat and a little low.  From [facility] energy and mood a little better.  Sleeps until 10-11 am about 12 hours. And asleep that whole time. Wife says he doesn't eat much bc he's not awake much. Trouble with hygiene.   Plan: Meds: continue duloxetine  to 120 mg daily.  Off Lexapro .  Lyrica  100 mg HS.  resumed Ritalin  30 AM with some benefit. DC Abiilify Vraylar  1.5 mg every other day Spravato disc in detail.  He wants to pursue if not better with Vraylar .  06/18/23 appt noted: with W Med:  duloxetine  120, resumed Concerta  36 mg AM, Lyrica  100 HS, no ropinirole .   Vraylar  1.5 every other day, no benefit Spravato denied by insurance but is being appealed. More trouble walking and shorter gait.  Neuro  in [place] appt in Sept.   Looking to get in in [place].  Can't be much more dep than I am.   OCD residual when has to make a decision.   ED is more of a px. Reduced voice family. Plan : Spravato  06/25/23 appt noted: with W Med:  duloxetine  120, Concerta  36 mg AM, Lyrica  100 HS, no ropinirole .   Vraylar  1.5 daily No SE Received Spravato 56 mg today.  Experienced very mild dissociation and no sig HA, N.  But some dizziness.  Resolved by end of 2 hours observation.  Able to leave office without assistance.  Ongoing dep without change.  Slow, reduced cognition, anhedonia, forgetful, low motivation. No other concerns with meds.   06/27/23 appt noted:  Med: Med:  duloxetine  120, Concerta  36 mg AM, Lyrica  100 HS, no ropinirole .   Stopped Vraylar  1.5 daily No SE Received Spravato 84 mg todayfor the first time..  Experienced very mild dissociation and no sig HA, N.  But some dizziness.  Resolved by end of 2 hours observation.  Able to leave office without assistance.  Ongoing dep without change.  Slow, reduced cognition, anhedonia, forgetful, low motivation. No other concerns with meds.   07/02/23 appt oted: Med: Med:  duloxetine  90, Concerta  36 mg AM, Lyrica  100 HS, no ropinirole .   Stopped Vraylar  1.5 daily No SE Received Spravato 84 mg today.  Experienced very mild dissociation and no sig HA, N.  But some dizziness.  Resolved by end of 2 hours observation.  Mildly drowsy at end of session. BC of gait px he needed to use WC to leave the office.  Per wife gait gradually worsened over a couple of years but accelerated recently in 3 mos or so.  Short gait.  Not due to pain.  Pending neuro appt.  MRI last week not read yet. No problems with meds. Wife noticed he's shown more initiative at home since Velva ex doing crossword puzzles.  More engaged.  He feels lighter already with it.  07/15/23 appt noted:  Med: Med:  duloxetine  90, Concerta  36 mg AM, Lyrica  100 HS, no ropinirole .  No SE Received Spravato  84 mg today.  Experienced very mild dissociation and no sig HA, N.  But some dizziness.  Resolved by end of 2 hours observation.  Mildly drowsy at end of session. BC of gait px he needed to use WC to leave the office. Seen with W.   W noted clear benefit Spravato with stronger voice, spontaneous chores he wasn't doing.  More engaged in coverstation.  Pt agrees. Disc concerns over gait px and his neuro visit and MRI scan suggesting Fahr's DZ.  He'll discuss further with DR. Tat tomorrow.  07/18/23 appt noted:  Med: Med:  duloxetine  90, Concerta  36 mg AM, Lyrica  100 HS, no ropinirole .  No SE Received Spravato 84 mg today.  Experienced very mild dissociation and no sig HA, N.  But some dizziness.  Resolved by end of 2 hours observation.  Mildly drowsy at end of session. BC of gait px he needed to use WC to leave the office. No med concerns.  Seen with W.    Both agree dep is better .  More affective range.  More socially engaged.  Quicker thought.  More active .  Less down and less negative.  07/23/23 appt noted: Med: Med:  duloxetine  90, Concerta  36 mg AM, Lyrica  100 HS, no ropinirole .  No SE Received Spravato 84 mg today.  Experienced very mild dissociation and no sig HA, N.  But some dizziness.  Resolved by end of 2 hours observation.  Mildly drowsy at end of session. BC of gait px he needed to use WC to leave the office. No med concerns.  Seen with W.    Dep is still improved.  But is dealing with poor gait, weak and slow.  Will start neurorehab today.   Anxiety re: OCD manageable. Thought and affect quicker and more responsive .  Humor returned.  07/25/23 appt:  Med: Med:  duloxetine  90, Concerta  36 mg AM, Lyrica  100 HS, no ropinirole .  No SE Received Spravato 84 mg today.  Experienced very mild dissociation and no sig HA, N.  But some dizziness.  Resolved by end of 2 hours observation.  Mildly drowsy at end of session. BC of gait px he needed to use WC to leave the office. No med  concerns.  Seen with W.    Overall mood still improving but not 100%.  Starting neurorehab for weakness.  No new med concerns.  Satisfied with meds.  07/30/23 appt noted:  Med: Med:  duloxetine  90, Concerta  36 mg AM, Lyrica  100 HS, no ropinirole .  No SE Received Spravato 84 mg today.  Experienced very mild dissociation and no sig HA, N.  But some dizziness.  Resolved by end of 2 hours observation.  Mildly drowsy at end of session. BC of gait px he needed to use WC to leave the office. No med concerns.  Seen with W.    Both agree mood is improving and activity and interest.  Not 100% but limited by mobility and starting PT.    08/15/23 appt noted:  Med:  duloxetine  90, Concerta  36 mg AM, Lyrica  100  HS, no ropinirole .  No SE Received Spravato 56 mg today.  Experienced very mild dissociation and no sig HA, N.  But some dizziness.  Resolved by end of 2 hours observation.  Mildly drowsy at end of session.  But less than when on 84 mg. BC of gait px he needed to use WC to leave the office. No med concerns.  Seen with W.    Less drowsy at  end of time here than when got 84 mg daily. They both agree good response with dep and anxiety.  More involved and better interaction socially.  More initiative.  08/20/23 appt noted:  Med:  duloxetine  90, Concerta  36 mg AM, Lyrica  100 HS, no ropinirole .  No SE Received Spravato 56 mg today.  Experienced very mild dissociation and no sig HA, N.  But some dizziness.  Resolved by end of 2 hours observation.  Mildly drowsy at end of session.  But less than when on 84 mg. BC of gait px he needed to use WC to leave the office. No med concerns.  Seen with W.   She's still concerned he sleeps too much.  He's not sure why he does so.  Overall mood, initiative, socialization is better. Less drowsy at  end of time here than when got 84 mg daily.  08/27/23 appt noted:  Med:  duloxetine  90, Concerta  36 mg AM, Lyrica  100 HS, no ropinirole .  No SE Received Spravato 84 mg  today.  Experienced very mild dissociation and no sig HA, N.  But some dizziness.  Resolved by end of 2 hours observation.  Mildly drowsy at end of session.  Walking is some better with PT.  Wife agrees.  He's more interested, responsive, engaged.  Better cognition. After Spravato 56 he does not have to nap when goes home. After 84 mg needed to nap but he wants to increase back to 84 mg to get max effect. More sedate after 84 mg today but is able to clear by end of session.  But wants to continue it.  Over all doing pretty well  with less dep and more engagement and activity including spontaneously cleaning things.   09/10/23 appt noted:  Med:  duloxetine  90, Concerta  36 mg AM, Lyrica  100 HS, no ropinirole .  No SE Received Spravato 84 mg today.  Experienced very mild dissociation and no sig HA, N.  But some dizziness.  Resolved by end of 2 hours observation.  Mildly drowsy at end of session.  Mood continues to improve overall and anxiety better too.  Walking is improving.  Cognition and emotional engagment, activity are all better. Plan no med changes  09/24/23 appt noted: Med: Med:  duloxetine  90, Concerta  36 mg AM, Lyrica  100 HS, no ropinirole .  No SE Received Spravato 84 mg today.  Experienced very mild dissociation and no sig HA, N.  But some dizziness.  Resolved by end of 2 hours observation.  Mildly drowsy at end of session.  Mood continues to improve overall and anxiety better too.  Walking is improving.  Cognition and emotional engagment, activity are all better. Asks about reducing duloxetine  bc ED issues. Plan: reduce duloxetine  to 60 mg daily to reduce ED  10/08/23 appt Med: Med:  duloxetine  60, Concerta  36 mg AM, Lyrica  100 HS, no ropinirole .  Received Spravato 84 mg today.  Experienced very mild dissociation and no sig HA, N.  But some dizziness.  Resolved by end of 2 hours observation.  Mildly drowsy at end of  session.  Some dysphoria this week but he and wife continue to see  improvement and benefit with meds and Spravato.   SE concerta  feeling jittery and skips it at times.   10/15/23 appt noted:  Med :  duloxetine  60, Concerta  36 mg AM, Lyrica  100 HS, no ropinirole .  Received Spravato 84 mg today.  Experienced very mild dissociation and no sig HA, N.  But some dizziness.  Resolved by end of 2 hours observation.  Mildly drowsy at end of session.  he and wife continue to see improvement and benefit with meds and Spravato.   Minimal depression now. Stressed by nearly scammed this week by caller, but wife stopped. It.  She is concerned bc in the past he would not have been so easily deceived.  However he says they were just good and he ultimately didn't get scammed.  No concerns with current meds. Plan reduce Concerta  to 27 mg AM  10/22/23 appt noted:  Med :  duloxetine  60, Concerta  36 mg AM, Lyrica  100 HS, no ropinirole .  Received Spravato 84 mg today.  Experienced very mild dissociation and no sig HA, N.  But some dizziness.  Resolved by end of 2 hours observation.  Mildly drowsy at end of session.  he and wife continue to see improvement and benefit with meds and Spravato.   Minimal depression now. Hasn't reduced Concerta  yet.   Is wearing a bladder cath bc U retention.   Suspected prostate px in workup.  Mood is still improving.  Will be here next week then going to see GS in [place] for a week. Plan: as noted reduce Concerta  2 AM  10/29/23 appt noted;  Med :  duloxetine  60, Concerta  36 mg AM, Lyrica  100 HS, no ropinirole .  Received Spravato 84 mg today.   Experienced very mild dissociation and no sig HA, N.  But some dizziness.  Resolved by end of 2 hours observation.  Mildly drowsy at end of session.  he and wife continue to see improvement and benefit with meds and Spravato.   Minimal depression now. Hasn't reduced Concerta  yet.   Is wearing a bladder cath bc U retention.   Frustrated over this but gets it out tomorrow. Pretty good with mood. No concerns with  meds.  Intermittent compliance with Concerta .   Wife concerns over poor judgment at times.  Like when makes financial decisions while receiving Spravato.  10/31/23 appt noted:  Med :  duloxetine  60, Concerta  36 mg AM, Lyrica  100 HS, no ropinirole .  Received Spravato 84 mg today.   Experienced very mild dissociation and no sig HA, N.  But some dizziness.  Resolved by end of 2 hours observation.  Mildly drowsy at end of session.  he and wife continue to see improvement and benefit with meds and Spravato.   Minimal depression now. Hasn't reduced Concerta  yet.   Is wearing a bladder cath bc U retention.   Frustrated over this. Mood is better markedly with Spravato.  Not as much OCD lately but distracted by health problems.    11/11/23 appt noted:  Med :  duloxetine  60, Concerta  36 mg AM, Lyrica  100 HS, no ropinirole .  Received Spravato 84 mg today.   Experienced very mild dissociation and no sig HA, N.  But some dizziness.  Resolved by end of 2 hours observation.  Mildly drowsy at end of session.  he and wife continue to see improvement and benefit with meds and Spravato.   Minimal depression now. Trip  to [place] to visit family went well sitting with GS.  Missed a Spravato admin.   Dep remained manageable generally.  Wife indicated he was negative this am but he feels better after Spravato.  Disc concerns abotu gait gradually better.  OCD is better.   Plan reduce Concerta  to 27 mg am  11/26/23 appt noted: Med :  duloxetine  60, Concerta  27 mg AM, Lyrica  100 HS, no ropinirole .  Received Spravato 84 mg today.   Experienced very mild dissociation and no sig HA, N.  But some dizziness.  Resolved by end of 2 hours observation.  Mildly drowsy at end of session.  he and wife continue to see improvement and benefit with meds and Spravato.   Minimal depression now. Done fine with less Concerta .  Mood is better and function better and wife agrees.  Satisfied with meds.   12/17/23 appt noted:  Med :   duloxetine  60, Concerta  27 mg AM, Lyrica  100 HS, no ropinirole .  Received Spravato 84 mg today.   No SE Experienced very mild dissociation and no sig HA, N.  But some dizziness.  Resolved by end of 2 hours observation.  Mildly drowsy at end of session.  he and wife continue to see improvement and benefit with meds and Spravato.   Tendency to do tasks under influence of Spravato against recommendation of staff and his wife's desires.  Minimal depression now.  OCD is still present but less anxiety from it now.  Doing well with meds without concerns.  Strength is not normal but has improved over time.  12/31/23 appt noted:  Med :  duloxetine  60, Concerta  27 mg AM, Lyrica  100 HS,.  Received Spravato 84 mg today.   No SE Experienced very mild dissociation and no sig HA, N.  But some dizziness.  Resolved by end of 2 hours observation.  Mildly drowsy at end of session.  he and wife continue to see improvement and benefit with meds and Spravato.  Both agree he is not sig dep today nor recently.  Is still tired physically and still hard to walk.  OCD and anxiety are pretty well controlled to with current tx plan. No med changes wanted.  ECT-MADRS    Flowsheet Row Office Visit from 06/18/2023 in [facility] Office Visit from 04/16/2023 in [facility]  MADRS Total Score 37 36   [id number]    Flowsheet Row Office Visit from 03/15/2020 in [facility] Total Score 3  PHQ-9 Total Score 9     B schizophrenic SUI. After M's death. PCP Cecil Ee at [facility] [place]  [facility] at 73 years old.  Prior psychiatric medication trials include  Lexapro  20, citalopram NR, clomipramine weight gain, paroxetine, fluoxetine, Luvox, Trintellix,   Increase Lexapro  back to 20 mg January 2020. & 10/2020 bupropion ,  Auvelity NR  Abilify  10 fatigue,  Cerefolin NAC, and   Naltrexone  sexual SE Lyrica  150  tired  pramipexole,  ropinirole  Adderall XR & IR sexual SE,  Ritalin  30, Concerta  72 mg AM NR modafinil  and Nuvigil,   History [employer] OCD  Review of Systems:  Review of Systems  Constitutional:  Positive for fatigue. Negative for fever.  Cardiovascular:  Positive for palpitations. Negative for chest pain.  Genitourinary:        ED  Musculoskeletal:  Positive for arthralgias, gait problem and myalgias.  Neurological:  Positive for weakness. Negative for dizziness, tremors and syncope.  Psychiatric/Behavioral:  Negative for agitation, behavioral problems, confusion, decreased concentration, dysphoric mood, hallucinations, self-injury, sleep disturbance and suicidal ideas. The patient is nervous/anxious. The patient is not hyperactive.     Medications: I have reviewed the patient's current medications.  Current Outpatient Medications  Medication Sig Dispense Refill   ALPRAZolam  (XANAX ) 0.25 MG tablet Take 1 tablet (0.25 mg total) by mouth 2 (two) times daily as needed for anxiety or sleep. 30 tablet 1   aspirin 81 MG tablet Take 81 mg by mouth daily.     Cholecalciferol (VITAMIN D -3) 125 MCG (5000 UT) TABS Take 1 tablet (5,000 Units total) by mouth daily. 90 tablet 1   DULoxetine  (CYMBALTA ) 30 MG capsule Take 3 capsules (90 mg total) by mouth daily. 270 capsule 0   methylphenidate  (CONCERTA ) 27 MG PO CR tablet Take 1 tablet (27 mg total) by mouth every morning. 30 tablet 0   naltrexone  (DEPADE) 50 MG tablet Take 0.5 tablets (25 mg total) by mouth daily. 45 tablet 1   pregabalin  (LYRICA ) 50 MG capsule Take 1 capsule (50 mg total) by mouth at bedtime. 90 capsule 0   simvastatin (ZOCOR) 20 MG tablet Take 20 mg by mouth at bedtime.     ZEPBOUND  10 MG/0.5ML Pen Inject 10 mg into the skin once a week.     No current facility-administered medications for this visit.    Medication Side Effects: None sexual SE are better not  All gone.  Allergies:  Allergies  Allergen Reactions    E.E.S. [Erythromycin] Hives   Macrolides And Ketolides Other (See Comments)    EES    Rosuvastatin     Other reaction(s): cramps    Past Medical History:  Diagnosis Date   Allergy    Anemia    iron- pt denies    Anxiety    Aortic cusp regurgitation    Carotid artery occlusion    Constipation    Coronary artery stenosis    Hyperlipidemia    Lactose intolerance    Major depression, recurrent, chronic    Obesity    OCD (obsessive compulsive disorder)    OSA (obstructive sleep apnea)    Other chronic pain    Periodic limb movement disorder    Periodic limb movements of sleep    Prediabetes    Pure hypercholesterolemia    Restless legs    Sleep apnea    wears cpap    Vitamin D  deficiency     Family History  Problem Relation Age of Onset   Cancer Mother        breast and ovarian   Anxiety disorder Mother    Breast cancer Mother    Ovarian cancer Mother    Depression Father        bi-polar   Hyperlipidemia Father    Heart disease Father    Sudden death Father    Bipolar disorder Father    Sleep apnea Father    Obesity Father    Depression Son    Colon cancer Neg Hx    Colon polyps Neg Hx    Esophageal cancer Neg Hx    Rectal cancer Neg Hx    Stomach cancer Neg Hx     Social History   Socioeconomic History   Marital status: Married    Spouse name: Not on file   Number of children: Not on file   Years of education: Not on file   Highest education level: Not on file  Occupational History  Occupation: retired pensions consultant  Tobacco Use   Smoking status: Never   Smokeless tobacco: Never  Substance and Sexual Activity   Alcohol use: Yes    Comment: occasionally    Drug use: No   Sexual activity: Not on file  Other Topics Concern   Not on file  Social History Narrative   Not on file   Social Drivers of Health   Financial Resource Strain: Not on file  Food Insecurity: No Food Insecurity (01/25/2022)   Received from [facility]  visits prior to 03/24/2022[facility]   Hunger Vital Sign    Within the past 12 months, you worried that your food would run out before you got the money to buy more.: Never true    Within the past 12 months, the food you bought just didn't last and you didn't have money to get more.: Never true  Transportation Needs: No Transportation Needs (01/25/2022)   Received from [facility] visits prior to 03/24/2022.   PRAPARE - Administrator, Civil Service (Medical): No    Lack of Transportation (Non-Medical): No  Physical Activity: Not on file  Stress: Not on file  Social Connections: Not on file  Intimate Partner Violence: Not on file    Past Medical History, Surgical history, Social history, and Family history were reviewed and updated as appropriate.   Please see review of systems for further details on the patient's review from today.   Objective:   Physical Exam:  There were no vitals taken for this visit.  Physical Exam Constitutional:      General: He is not in acute distress.    Appearance: He is obese.  Musculoskeletal:        General: No deformity.  Neurological:     Mental Status: He is alert and oriented to person, place, and time.     Cranial Nerves: No dysarthria.     Motor: Weakness present.     Coordination: Coordination abnormal.     Comments: Shortened gait is better.  No sig tremor.  less Slow but still not normal.  Psychiatric:        Attention and Perception: Attention and perception normal. He does not perceive auditory or visual hallucinations.        Mood and Affect: Mood is not anxious or depressed. Affect is not labile or blunt.        Speech: Speech normal.        Behavior: Behavior is not slowed. Behavior is cooperative.        Thought Content: Thought content normal. Thought content is not paranoid or delusional. Thought content does not include homicidal or suicidal ideation. Thought content does not  include suicidal plan.        Cognition and Memory: Cognition and memory normal.        Judgment: Judgment normal.     Comments: Insight intact Depression manageable. OCD is about the same and not the main problem More expressive and more normal affect.  Quicker responses.    November 06, 2018: [place] Cog test in office within normal limits MMSE 28/30. Animal fluency 17 . (borderline) Taken as a whole, no indication to pursue neuropsychological testing.  Mini-Mental status exam 28/30 on 10/27/20.  No evidence of dementia.  Lab Review:   Vitamin D  level acceptable at 54.5.   Normal B12 and folate and TSH in last couple of years..  Echocardiogram is stable re: AVR  over the last 8 years and not likely the cause of lethargy.   06/28/23 MRI head:  IMPRESSION: 1. No acute intracranial abnormality. 2. 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 Head CT may be helpful for further assessment of the degree of mineralization. 3. Findings of chronic small vessel ischemia.  SABRAres Assessment: Plan:    Kewan was seen today for follow-up, depression, anxiety, fatigue and add.  Diagnoses and all orders for this visit:  Recurrent major depression resistant to treatment  Mixed obsessional thoughts and acts  Attention deficit hyperactivity disorder (ADHD), predominantly inattentive type  Hypersomnia with sleep apnea  Mild cognitive impairment  Restless legs syndrome     Mr. Zeitz has a long history of depression and OCD.  Major depression with fatigue, cognitive and physical slowness, anhedonia is much worse.  Started Spravato  and improving.  Was better energy with duloxetine  90 vs Lexapro  so increased to 120 mg daily DT worsening depression.    But it was not better. So reduced it back to 90 mg daily.    Now reduced to 60 mg daily to see if ED better and also  bc recent U retention.  (Not likely related) now dep bettter with Spravato. Disc risk worsening dep and anxiety.   He has some compulsive checking and obsessions around the house maintenance.  When travels then tends to have less OCD bc triggered less.    Received 84 mg Spravato today. The patient experienced the typical dissociation which gradually resolved over the 2-hour period of observation.  There were no complications.  Specifically the patient did not have nausea or vomiting or headache.  Blood pressures remained within normal ranges at the 40-minute and 2-hour follow-up intervals.  By the time the 2-hour observation period was met the patient was alert and oriented and able to exit without assistance.  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  See nursing note for further details. Emphasized need to relax with Spravato and not return calls, read, return chats, emails etc. Started Spravato 06/25/23. continue weekly @ 84 mg Strongly rec avoid making any decisions nor dealing with any verbal information during the Spravato procedure.  He is not compliant so far with this.  Disc in detail with him and his wife.  His OCD is chronic with checking lites, stove, etc. .  It is better at the moment DT depression being worse.  Disc CBT techniques and potential for more therapy to address. Option Dr. Marijean.  Concerta  to 27  mg AM to reduce SE.   He wants to use it for depression and cognitive concerns.  Discussed potential benefits, risks, and side effects of stimulants with patient to include increased heart rate, palpitations, insomnia, increased anxiety, increased irritability, or decreased appetite.  Instructed patient to contact office if experiencing any significant tolerability issues.  Extensive discussion of sleep study and he has a copy.  Why is there virtually no N3 & REM sleep?  Is that affecting  daytime alertness and fatigue?  Vs how much is related to mild OSA and PLMS?  Disc this in detail.  Wrote correspondence with sleep doc about it.  Options for sleep & fatigue:  Difficult to assess  bc has been out of country and then sick for 3 weeks.   Focus on reduction in OSA Focus on improving deep stage sleep.  ?  Rx low dose mirtazapine or alternatives Focus on leg movements (hx RLS/PLMS) continue Trial as had been suggested Lyrica  for FM type sx and may help RLS/PLMS.  Better dreaming on 100 mg HS and too sleepy on 150.  Decreased  to 100 mg HS for sleep and back pain and RLS No ropinirole  needed.   Disc SE.  Not having any.   Use LED Xanax  and try to avoid BZ daytime bc fatigue.  He doesn't use it much daythime.  Using 0..25-0.5 mg at night.  May need less with more Lyrica  at [facility] and try to minimize.  No hangover. We discussed the short-term risks associated with benzodiazepines including sedation and increased fall risk among others.  Discussed long-term side effect risk including dependence, potential withdrawal symptoms, and the potential eventual dose-related risk of dementia.  But recent studies from 2020 dispute this association between benzodiazepines and dementia risk. Newer studies in 2020 do not support an association with dementia.  Stimulant partially successfully used off label to augment antidepressants for depression and have resulted in improved productivity and attention.    Previous screening of memory was not suggestive of any neuro degenerative process: Mini-Mental status exam 28/30 on 10/27/20.   Recent cognition worse as dep worsened.  Also gait is short and slow.   neuro appt.  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  Disc Dr. Collie note and question of whether mobility px related to hx Vraylar .  No AIM noted but reports at times with have invol tongue protrusion.  Not uncomfortable.  No movements abnormal noted today  Plan:  duloxetine  60 mg daily to reduce ED   Lyrica  100 mg HS.   Continue [facility] ER to 27 mg AM,    Spravato disc in detail.  He wants to continue.  He and wife notice he's better.  Administer now drop back to weekly  He's still improving.   He is tolerating it better  Follow-up   Lorene Macintosh MD, DFAPA.  Please see After Visit Summary for patient specific instructions.  Future Appointments  Date Time Provider Department Center  01/07/2024  9:00 AM CP-NURSE [facility] None  01/07/2024  9:00 AM Teresa Rogue A, NP [facility] None  01/30/2024  3:00 PM Tat, Asberry RAMAN, DO [facility] None  04/02/2024  8:00 AM Gilgannon, Chloe N, PT [facility]     No orders of the defined types were placed in this encounter.      -------------------------------

## 2024-01-02 DIAGNOSIS — G4710 Hypersomnia, unspecified: Secondary | ICD-10-CM | POA: Diagnosis not present

## 2024-01-02 DIAGNOSIS — G4733 Obstructive sleep apnea (adult) (pediatric): Secondary | ICD-10-CM | POA: Diagnosis not present

## 2024-01-02 DIAGNOSIS — R531 Weakness: Secondary | ICD-10-CM | POA: Diagnosis not present

## 2024-01-02 DIAGNOSIS — E7800 Pure hypercholesterolemia, unspecified: Secondary | ICD-10-CM | POA: Diagnosis not present

## 2024-01-03 MED ORDER — DULOXETINE HCL 30 MG PO CPEP: 90.0000 mg | ORAL_CAPSULE | Freq: Every day | ORAL | 0 refills | Status: DC

## 2024-01-03 MED ORDER — METHYLPHENIDATE HCL ER (OSM) 27 MG PO TBCR: 27.0000 mg | EXTENDED_RELEASE_TABLET | ORAL | 0 refills | Status: DC

## 2024-01-07 ENCOUNTER — Other Ambulatory Visit: Payer: Self-pay

## 2024-01-07 ENCOUNTER — Ambulatory Visit

## 2024-01-07 ENCOUNTER — Ambulatory Visit: Admitting: Behavioral Health

## 2024-01-07 VITALS — BP 140/92 | HR 62

## 2024-01-07 DIAGNOSIS — F339 Major depressive disorder, recurrent, unspecified: Secondary | ICD-10-CM

## 2024-01-07 MED ORDER — NALTREXONE HCL 50 MG PO TABS: 25.0000 mg | ORAL_TABLET | Freq: Every day | ORAL | 1 refills | Status: DC

## 2024-01-07 NOTE — Progress Notes (Signed)
 NURSES NOTE:         Pt arrived for his #36 Spravato Treatment for treatment resistant depression, the starting dose was 56 mg (2 of the 28 mg nasal sprays). Pt is getting 84 mg again today after receiving 3 of the 56 mg the past 3 weeks due to sedation.  Eric [name] is a patient of Dr. Calhoun so he will follow his care throughout treatments and follow ups. Pt's Spravato is a medical authorization through buy and bill.  Spravato medication is stored at treatment center per REMS/FDA guidelines. The medication is required to be locked behind two doors per REMS/FDA protocol. Medication is also disposed of properly after each use per regulations. All documentation for REMS is completed and submitted per FDA/REMS requirements.          Began taking patient's vital signs at 9:13 AM 135/74, pulse 69, SpO2 96%. Instructed patient to blow his nose if needed then recline back to a 45 degree angle. Gave patient first dose 28 mg nasal spray, administered in each nostril as directed and observed by nurse, waited 5 more minutes for the second and third dose.  After all doses given pt did not complain of any nausea/vomiting. Pt was given water, snacks, and candy. Assessed his 40 minute vitals, 9:55 AM, 128/67, pulse 63, SpO2 95%. Explained he would be monitored for a total time of 120 minutes. Discharge vitals were taken at 11:19 AM 140/92, P 62, SpO2 95%. [name], NP met with pt. to discuss his treatment once his thoughts were clearer. Recommend he go home and sleep or just relax on the couch. No driving, no intense activities. Verbalized understanding. Nurse was with pt a total of 60 minutes for clinical assessment. Pt's wife picks him up after treatment. Instructed to call with any issues. Pt will return next Tuesday.   (84 mg) LOT [id number] EXP AUG 2028

## 2024-01-07 NOTE — Progress Notes (Signed)
 Eric [name] [id number] 10/26/1950 73 y.o.  Subjective:   Patient ID:  Eric [name] is a 73 y.o. (DOB 1950-10-21) male.  Chief Complaint: No chief complaint on file.   HPI Eric [name]. Bartolucci presents to the office today for follow-up of  treatment resistant depression (TRD).   Spravato treatment    Patient was administered Spravato 84mg  intranasally today. Patient was observed by provider throughout Spravato treatment. The patient experienced the typical dissociation which gradually resolved over the 2-hour period of observation. There were no complications. Specifically, the patient did not have any untoward side effects - feeling disconnected from themself, their thoughts, feelings and things around them, dizziness, nausea, feeling sleepy, decreased feeling of sensitivity (numbness) spinning sensation, feeling anxious, lack of energy, increased blood pressure, feeling happy or very excited, or headache. Blood pressures remained within normal ranges at the 40-minute and 2-hour follow-up intervals. By the time the 2-hour observation period was met the patient was alert and oriented and able to exit without assistance. Patient willing to continue Spravato administration for the treatment of resistant depression.    Reports Depression 2/10 today. Feels like depression is starting to improve.  Patient was sleeping and had to be aroused.  Cognitively sound.  Said that session was good today.  Wife is her to provide transportation.  No question or concerns this visit.           ECT-MADRS    Flowsheet Row Office Visit from 06/18/2023 in [facility] Office Visit from 04/16/2023 in [facility]  MADRS Total Score 37 36   PHQ2-9    Flowsheet Row Office Visit from 03/15/2020 in [facility] Total Score 3  PHQ-9 Total Score 9     Review of Systems:  Review of Systems   Constitutional: Negative.   Allergic/Immunologic: Negative.   Neurological: Negative.   Psychiatric/Behavioral:  Positive for dysphoric mood.     Medications: I have reviewed the patient's current medications.  Current Outpatient Medications  Medication Sig Dispense Refill   ALPRAZolam  (XANAX ) 0.25 MG tablet Take 1 tablet (0.25 mg total) by mouth 2 (two) times daily as needed for anxiety or sleep. 30 tablet 1   aspirin 81 MG tablet Take 81 mg by mouth daily.     Cholecalciferol (VITAMIN D -3) 125 MCG (5000 UT) TABS Take 1 tablet (5,000 Units total) by mouth daily. 90 tablet 1   DULoxetine  (CYMBALTA ) 30 MG capsule Take 3 capsules (90 mg total) by mouth daily. 270 capsule 0   methylphenidate  (CONCERTA ) 27 MG PO CR tablet Take 1 tablet (27 mg total) by mouth every morning. 30 tablet 0   naltrexone  (DEPADE) 50 MG tablet Take 0.5 tablets (25 mg total) by mouth daily. 45 tablet 1   pregabalin  (LYRICA ) 50 MG capsule Take 1 capsule (50 mg total) by mouth at bedtime. 90 capsule 0   simvastatin (ZOCOR) 20 MG tablet Take 20 mg by mouth at bedtime.     ZEPBOUND  10 MG/0.5ML Pen Inject 10 mg into the skin once a week.     No current facility-administered medications for this visit.    Medication Side Effects: None  Allergies: Allergies[1]  Past Medical History:  Diagnosis Date   Allergy    Anemia    iron- pt denies    Anxiety    Aortic cusp regurgitation    Carotid artery occlusion    Constipation    Coronary artery stenosis  Hyperlipidemia    Lactose intolerance    Major depression, recurrent, chronic    Obesity    OCD (obsessive compulsive disorder)    OSA (obstructive sleep apnea)    Other chronic pain    Periodic limb movement disorder    Periodic limb movements of sleep    Prediabetes    Pure hypercholesterolemia    Restless legs    Sleep apnea    wears cpap    Vitamin D  deficiency     Past Medical History, Surgical history, Social history, and Family history were  reviewed and updated as appropriate.   Please see review of systems for further details on the patient's review from today.   Objective:   Physical Exam:  There were no vitals taken for this visit.  Physical Exam Constitutional:      General: He is not in acute distress.    Appearance: Normal appearance.  Neurological:     Mental Status: He is alert and oriented to person, place, and time.     Gait: Gait normal.  Psychiatric:        Attention and Perception: Attention and perception normal. He does not perceive auditory or visual hallucinations.        Mood and Affect: Mood and affect normal. Mood is not anxious or depressed. Affect is not labile.        Speech: Speech normal.        Behavior: Behavior normal. Behavior is cooperative.        Thought Content: Thought content normal.        Cognition and Memory: Cognition and memory normal.        Judgment: Judgment normal.     Lab Review:  No results found for: NA, K, CL, CO2, GLUCOSE, BUN, CREATININE, CALCIUM, PROT, ALBUMIN, AST, ALT, ALKPHOS, BILITOT, GFRNONAA, GFRAA  No results found for: WBC, RBC, HGB, HCT, PLT, MCV, MCH, MCHC, RDW, LYMPHSABS, MONOABS, EOSABS, BASOSABS  No results found for: POCLITH, LITHIUM    No results found for: PHENYTOIN, PHENOBARB, VALPROATE, CBMZ   .res Assessment: Plan:     Recommendations:   Sleeping and had to arouse.  Needs assistance walking. No questions or concerns this visit. Requesting refills   Continue Spravato treatment.  Will continue to f/u with Lorene Macintosh MD for medication management.     Diagnoses and all orders for this visit:  Recurrent major depression resistant to treatment     Please see After Visit Summary for patient specific instructions.  Future Appointments  Date Time Provider Department Center  01/30/2024  3:00 PM Tat, Asberry RAMAN, DO [facility] None  04/02/2024  8:00 AM Gilgannon, Chloe N, PT  [facility]    No orders of the defined types were placed in this encounter.   -------------------------------    [1]  Allergies Allergen Reactions   E.E.S. [Erythromycin] Hives   Macrolides And Ketolides Other (See Comments)    EES    Rosuvastatin     Other reaction(s): cramps

## 2024-01-14 ENCOUNTER — Encounter: Admitting: Psychiatry

## 2024-01-14 ENCOUNTER — Ambulatory Visit

## 2024-01-14 ENCOUNTER — Other Ambulatory Visit: Payer: Self-pay | Admitting: Psychiatry

## 2024-01-14 VITALS — BP 132/76 | HR 66

## 2024-01-14 DIAGNOSIS — G8929 Other chronic pain: Secondary | ICD-10-CM

## 2024-01-14 DIAGNOSIS — F339 Major depressive disorder, recurrent, unspecified: Secondary | ICD-10-CM

## 2024-01-14 DIAGNOSIS — G4710 Hypersomnia, unspecified: Secondary | ICD-10-CM

## 2024-01-14 DIAGNOSIS — F900 Attention-deficit hyperactivity disorder, predominantly inattentive type: Secondary | ICD-10-CM

## 2024-01-14 DIAGNOSIS — F5221 Male erectile disorder: Secondary | ICD-10-CM

## 2024-01-14 DIAGNOSIS — G3184 Mild cognitive impairment, so stated: Secondary | ICD-10-CM

## 2024-01-14 DIAGNOSIS — F422 Mixed obsessional thoughts and acts: Secondary | ICD-10-CM

## 2024-01-14 DIAGNOSIS — G2581 Restless legs syndrome: Secondary | ICD-10-CM

## 2024-01-14 MED ORDER — NALTREXONE HCL 50 MG PO TABS: 25.0000 mg | ORAL_TABLET | Freq: Every day | ORAL | 1 refills | Status: AC

## 2024-01-14 NOTE — Progress Notes (Signed)
 NURSES NOTE:         Pt arrived for his #37 Spravato Treatment for treatment resistant depression, the starting dose was 56 mg (2 of the 28 mg nasal sprays). Pt is getting 84 mg again today after receiving 3 of the 56 mg the past 3 weeks due to sedation.  Eric [name] is a patient of Dr. Calhoun so he will follow his care throughout treatments and follow ups. Pt's Spravato is a medical authorization through buy and bill.  Spravato medication is stored at treatment center per REMS/FDA guidelines. The medication is required to be locked behind two doors per REMS/FDA protocol. Medication is also disposed of properly after each use per regulations. All documentation for REMS is completed and submitted per FDA/REMS requirements.          Began taking patient's vital signs at 9:15 AM 141/72, pulse 66, SpO2 96%. Instructed patient to blow his nose if needed then recline back to a 45 degree angle. Gave patient first dose 28 mg nasal spray, administered in each nostril as directed and observed by nurse, waited 5 more minutes for the second and third dose.  After all doses given pt did not complain of any nausea/vomiting. Pt was given water, snacks, and candy. Assessed his 40 minute vitals, 9:52 AM, 134/74, pulse 66, SpO2 96%. Explained he would be monitored for a total time of 120 minutes. Discharge vitals were taken at 11:05 AM 132/76, P 66, SpO2 96%. Dr. Geoffry met with pt. to discuss his treatment once his thoughts were clearer. Recommend he go home and sleep or just relax on the couch. No driving, no intense activities. Verbalized understanding. Nurse was with pt a total of 60 minutes for clinical assessment. Pt's wife picks him up after treatment. Instructed to call with any issues. Pt will return next Tuesday.   (84 mg) LOT [id number] EXP AUG 2028

## 2024-01-20 ENCOUNTER — Encounter: Payer: Self-pay | Admitting: Psychiatry

## 2024-01-20 NOTE — Progress Notes (Signed)
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 appt noted: Worrying too much bc OCD.  Trying to decide about how to deal with a trigger lately.  OCD driving me crazy wanting to make things perfect.   Other than the trigger had a nice time since here.  GS here for 5 days at 73 yo.  Enjoyed that.   Taking semaglutide   down to 250# from 283#. Card at [facility] says he does not have Aortic aneurysm.  Measuring is OK.  Will FU with MRI in June.   Some diarrhea lately. Cannot stop worrying.  Triggered by the plumbing issue. Meds as above.  No SE of sig.   Plan:  switched to duloxetine  to 90 mg daily.  Off Lexapro .  Others as noted. Lyrica  100 mg HS. Has held Ritalin  10 TID, can resume as long as SBP below 140 per card.  none needed ropinirole  3 mg pm.  No risperidone .  02/18/23 appt noted:  with [name] Meds: as above. Taking Ritalin  30% of night.  Prn Xanax  rarely if gets off sleep cycle. No SE.   Terribly dep but not as bad as in the past. Taking Ritalin  appears to help significantly and started doing that.  Tend to stay in bed till noon but pattern of up late.   OCD about the same with some avoidance of detail work.   Afraid I'll throw away something I need.   She doesn't know whether Ritalin  helps No sig RLS and wife agrees. Thinks of deceased B at the holidays but worries over son Deward too.   Plan: Increase duloxetine  to 120 mg daily.  Off Lexapro .  Others as noted. Lyrica  100 mg HS.  resumed Ritalin  30 AM  with some benefit. no risperidone .  03/19/23 appt noted: Psych med:  duloxetine  120, Ritalin  20 am  missing doses, Lyrica  100 HS, no risperidone , no ropinirole .   No improvement in depression since increase dose duloxetine .  More dep than usual.  Worrying about everything.  Including taxes.  All I can see is a black hole.  Making him feel negative about everything.   Keeping him from doing things.   OCD is not much different from when on Lexapro .  Wife agrees dep and distracted. Plan: resumed Ritalin  30 AM with some benefit. Resume Abilify  2 mg AM for dep.  04/16/23 appt noted:  wife here Psych med:  duloxetine  120, resumed Concerta  36 mg AM, Lyrica  100 HS, Abilify  2, no ropinirole .   Wife noted he seemed manic yesterday.  Invited window estimate against wife's will and without her input.   No mania noted until yesterday. He didn't feel manic yesterday.   He's noticed no change with Abilify  and still feels flat and a little low.  From [facility] energy and mood a little better.  Sleeps until 10-11 am about 12 hours. And asleep that whole time. Wife says he doesn't eat much bc he's not awake much. Trouble with hygiene.   Plan: Meds: continue duloxetine  to 120 mg daily.  Off Lexapro .  Lyrica  100 mg HS.  resumed Ritalin  30 AM with some benefit. DC Abiilify Vraylar  1.5 mg every other day Spravato disc in detail.  He wants to pursue if not better with Vraylar .  06/18/23 appt noted: with W Med:  duloxetine  120, resumed Concerta  36 mg AM, Lyrica  100 HS, no ropinirole .   Vraylar  1.5 every other day, no benefit Spravato denied by insurance but is being appealed. More trouble walking and shorter gait.  Neuro  in [place] appt in Sept.   Looking to get in in [place].  Can't be much more dep than I am.   OCD residual when has to make a decision.   ED is more of a px. Reduced voice family. Plan : Spravato  06/25/23 appt noted: with W Med:  duloxetine  120, Concerta  36 mg AM, Lyrica  100 HS, no ropinirole .   Vraylar  1.5 daily No SE Received Spravato 56 mg today.  Experienced very mild dissociation and no sig HA, N.  But some dizziness.  Resolved by end of 2 hours observation.  Able to leave office without assistance.  Ongoing dep without change.  Slow, reduced cognition, anhedonia, forgetful, low motivation. No other concerns with meds.   06/27/23 appt noted:  Med: Med:  duloxetine  120, Concerta  36 mg AM, Lyrica  100 HS, no ropinirole .   Stopped Vraylar  1.5 daily No SE Received Spravato 84 mg todayfor the first time..  Experienced very mild dissociation and no sig HA, N.  But some dizziness.  Resolved by end of 2 hours observation.  Able to leave office without assistance.  Ongoing dep without change.  Slow, reduced cognition, anhedonia, forgetful, low motivation. No other concerns with meds.   07/02/23 appt oted: Med: Med:  duloxetine  90, Concerta  36 mg AM, Lyrica  100 HS, no ropinirole .   Stopped Vraylar  1.5 daily No SE Received Spravato 84 mg today.  Experienced very mild dissociation and no sig HA, N.  But some dizziness.  Resolved by end of 2 hours observation.  Mildly drowsy at end of session. BC of gait px he needed to use WC to leave the office.  Per wife gait gradually worsened over a couple of years but accelerated recently  in 3 mos or so.  Short gait.  Not due to pain.  Pending neuro appt.  MRI last week not read yet. No problems with meds. Wife noticed he's shown more initiative at home since [place] ex doing crossword puzzles.  More engaged.  He feels lighter already with it.  07/15/23 appt noted:  Med: Med:  duloxetine  90, Concerta  36 mg AM, Lyrica  100 HS, no ropinirole .  No SE Received Spravato  84 mg today.  Experienced very mild dissociation and no sig HA, N.  But some dizziness.  Resolved by end of 2 hours observation.  Mildly drowsy at end of session. BC of gait px he needed to use WC to leave the office. Seen with W.   W noted clear benefit Spravato with stronger voice, spontaneous chores he wasn't doing.  More engaged in coverstation.  Pt agrees. Disc concerns over gait px and his neuro visit and MRI scan suggesting Fahr's DZ.  He'll discuss further with DR. Tat tomorrow.  07/18/23 appt noted:  Med: Med:  duloxetine  90, Concerta  36 mg AM, Lyrica  100 HS, no ropinirole .  No SE Received Spravato 84 mg today.  Experienced very mild dissociation and no sig HA, N.  But some dizziness.  Resolved by end of 2 hours observation.  Mildly drowsy at end of session. BC of gait px he needed to use WC to leave the office. No med concerns.  Seen with W.    Both agree dep is better .  More affective range.  More socially engaged.  Quicker thought.  More active .  Less down and less negative.  07/23/23 appt noted: Med: Med:  duloxetine  90, Concerta  36 mg AM, Lyrica  100 HS, no ropinirole .  No SE Received Spravato 84 mg today.  Experienced very mild dissociation and no sig HA, N.  But some dizziness.  Resolved by end of 2 hours observation.  Mildly drowsy at end of session. BC of gait px he needed to use WC to leave the office. No med concerns.  Seen with W.    Dep is still improved.  But is dealing with poor gait, weak and slow.  Will start neurorehab today.   Anxiety re: OCD manageable. Thought and affect quicker and more responsive .  Humor returned.  07/25/23 appt:  Med: Med:  duloxetine  90, Concerta  36 mg AM, Lyrica  100 HS, no ropinirole .  No SE Received Spravato 84 mg today.  Experienced very mild dissociation and no sig HA, N.  But some dizziness.  Resolved by end of 2 hours observation.  Mildly drowsy at end of session. BC of gait px he needed to use WC to leave the office. No med  concerns.  Seen with W.    Overall mood still improving but not 100%.  Starting neurorehab for weakness.  No new med concerns.  Satisfied with meds.  07/30/23 appt noted:  Med: Med:  duloxetine  90, Concerta  36 mg AM, Lyrica  100 HS, no ropinirole .  No SE Received Spravato 84 mg today.  Experienced very mild dissociation and no sig HA, N.  But some dizziness.  Resolved by end of 2 hours observation.  Mildly drowsy at end of session. BC of gait px he needed to use WC to leave the office. No med concerns.  Seen with W.    Both agree mood is improving and activity and interest.  Not 100% but limited by mobility and starting PT.    08/15/23 appt noted:  Med:  duloxetine  90,  Concerta  36 mg AM, Lyrica  100 HS, no ropinirole .  No SE Received Spravato 56 mg today.  Experienced very mild dissociation and no sig HA, N.  But some dizziness.  Resolved by end of 2 hours observation.  Mildly drowsy at end of session.  But less than when on 84 mg. BC of gait px he needed to use WC to leave the office. No med concerns.  Seen with W.    Less drowsy at  end of time here than when got 84 mg daily. They both agree good response with dep and anxiety.  More involved and better interaction socially.  More initiative.  08/20/23 appt noted:  Med:  duloxetine  90, Concerta  36 mg AM, Lyrica  100 HS, no ropinirole .  No SE Received Spravato 56 mg today.  Experienced very mild dissociation and no sig HA, N.  But some dizziness.  Resolved by end of 2 hours observation.  Mildly drowsy at end of session.  But less than when on 84 mg. BC of gait px he needed to use WC to leave the office. No med concerns.  Seen with W.   She's still concerned he sleeps too much.  He's not sure why he does so.  Overall mood, initiative, socialization is better. Less drowsy at  end of time here than when got 84 mg daily.  08/27/23 appt noted:  Med:  duloxetine  90, Concerta  36 mg AM, Lyrica  100 HS, no ropinirole .  No SE Received Spravato 84 mg  today.  Experienced very mild dissociation and no sig HA, N.  But some dizziness.  Resolved by end of 2 hours observation.  Mildly drowsy at end of session.  Walking is some better with PT.  Wife agrees.  He's more interested, responsive, engaged.  Better cognition. After Spravato 56 he does not have to nap when goes home. After 84 mg needed to nap but he wants to increase back to 84 mg to get max effect. More sedate after 84 mg today but is able to clear by end of session.  But wants to continue it.  Over all doing pretty well  with less dep and more engagement and activity including spontaneously cleaning things.   09/10/23 appt noted:  Med:  duloxetine  90, Concerta  36 mg AM, Lyrica  100 HS, no ropinirole .  No SE Received Spravato 84 mg today.  Experienced very mild dissociation and no sig HA, N.  But some dizziness.  Resolved by end of 2 hours observation.  Mildly drowsy at end of session.  Mood continues to improve overall and anxiety better too.  Walking is improving.  Cognition and emotional engagment, activity are all better. Plan no med changes  09/24/23 appt noted: Med: Med:  duloxetine  90, Concerta  36 mg AM, Lyrica  100 HS, no ropinirole .  No SE Received Spravato 84 mg today.  Experienced very mild dissociation and no sig HA, N.  But some dizziness.  Resolved by end of 2 hours observation.  Mildly drowsy at end of session.  Mood continues to improve overall and anxiety better too.  Walking is improving.  Cognition and emotional engagment, activity are all better. Asks about reducing duloxetine  bc ED issues. Plan: reduce duloxetine  to 60 mg daily to reduce ED  10/08/23 appt Med: Med:  duloxetine  60, Concerta  36 mg AM, Lyrica  100 HS, no ropinirole .  Received Spravato 84 mg today.  Experienced very mild dissociation and no sig HA, N.  But some dizziness.  Resolved by end of 2 hours observation.  Mildly drowsy at end of session.  Some dysphoria this week but he and wife continue to see  improvement and benefit with meds and Spravato.   SE concerta  feeling jittery and skips it at times.   10/15/23 appt noted:  Med :  duloxetine  60, Concerta  36 mg AM, Lyrica  100 HS, no ropinirole .  Received Spravato 84 mg today.  Experienced very mild dissociation and no sig HA, N.  But some dizziness.  Resolved by end of 2 hours observation.  Mildly drowsy at end of session.  he and wife continue to see improvement and benefit with meds and Spravato.   Minimal depression now. Stressed by nearly scammed this week by caller, but wife stopped. It.  She is concerned bc in the past he would not have been so easily deceived.  However he says they were just good and he ultimately didn't get scammed.  No concerns with current meds. Plan reduce Concerta  to 27 mg AM  10/22/23 appt noted:  Med :  duloxetine  60, Concerta  36 mg AM, Lyrica  100 HS, no ropinirole .  Received Spravato 84 mg today.  Experienced very mild dissociation and no sig HA, N.  But some dizziness.  Resolved by end of 2 hours observation.  Mildly drowsy at end of session.  he and wife continue to see improvement and benefit with meds and Spravato.   Minimal depression now. Hasn't reduced Concerta  yet.   Is wearing a bladder cath bc U retention.   Suspected prostate px in workup.  Mood is still improving.  Will be here next week then going to see GS in [place] for a week. Plan: as noted reduce Concerta  2 AM  10/29/23 appt noted;  Med :  duloxetine  60, Concerta  36 mg AM, Lyrica  100 HS, no ropinirole .  Received Spravato 84 mg today.   Experienced very mild dissociation and no sig HA, N.  But some dizziness.  Resolved by end of 2 hours observation.  Mildly drowsy at end of session.  he and wife continue to see improvement and benefit with meds and Spravato.   Minimal depression now. Hasn't reduced Concerta  yet.   Is wearing a bladder cath bc U retention.   Frustrated over this but gets it out tomorrow. Pretty good with mood. No concerns with  meds.  Intermittent compliance with Concerta .   Wife concerns over poor judgment at times.  Like when makes financial decisions while receiving Spravato.  10/31/23 appt noted:  Med :  duloxetine  60, Concerta  36 mg AM, Lyrica  100 HS, no ropinirole .  Received Spravato 84 mg today.   Experienced very mild dissociation and no sig HA, N.  But some dizziness.  Resolved by end of 2 hours observation.  Mildly drowsy at end of session.  he and wife continue to see improvement and benefit with meds and Spravato.   Minimal depression now. Hasn't reduced Concerta  yet.   Is wearing a bladder cath bc U retention.   Frustrated over this. Mood is better markedly with Spravato.  Not as much OCD lately but distracted by health problems.    11/11/23 appt noted:  Med :  duloxetine  60, Concerta  36 mg AM, Lyrica  100 HS, no ropinirole .  Received Spravato 84 mg today.   Experienced very mild dissociation and no sig HA, N.  But some dizziness.  Resolved by end of 2 hours observation.  Mildly drowsy at end of session.  he and wife continue to see improvement and benefit with meds and Spravato.  Minimal depression now. Trip to [place] to visit family went well sitting with GS.  Missed a Spravato admin.   Dep remained manageable generally.  Wife indicated he was negative this am but he feels better after Spravato.  Disc concerns abotu gait gradually better.  OCD is better.   Plan reduce Concerta  to 27 mg am  11/26/23 appt noted: Med :  duloxetine  60, Concerta  27 mg AM, Lyrica  100 HS, no ropinirole .  Received Spravato 84 mg today.   Experienced very mild dissociation and no sig HA, N.  But some dizziness.  Resolved by end of 2 hours observation.  Mildly drowsy at end of session.  he and wife continue to see improvement and benefit with meds and Spravato.   Minimal depression now. Done fine with less Concerta .  Mood is better and function better and wife agrees.  Satisfied with meds.   12/17/23 appt noted:  Med :   duloxetine  60, Concerta  27 mg AM, Lyrica  100 HS, no ropinirole .  Received Spravato 84 mg today.   No SE Experienced very mild dissociation and no sig HA, N.  But some dizziness.  Resolved by end of 2 hours observation.  Mildly drowsy at end of session.  he and wife continue to see improvement and benefit with meds and Spravato.   Tendency to do tasks under influence of Spravato against recommendation of staff and his wife's desires.  Minimal depression now.  OCD is still present but less anxiety from it now.  Doing well with meds without concerns.  Strength is not normal but has improved over time.  12/31/23 appt noted:  Med :  duloxetine  60, Concerta  27 mg AM, Lyrica  100 HS,.  Received Spravato 84 mg today.   No SE Experienced very mild dissociation and no sig HA, N.  But some dizziness.  Resolved by end of 2 hours observation.  Mildly drowsy at end of session.  Persistent weakness requiring WC when he leaves. he and wife continue to see improvement and benefit with meds and Spravato.  Both agree he is not sig dep today nor recently.  Is still tired physically and still hard to walk.  OCD and anxiety are pretty well controlled to with current tx plan. No med changes wanted.  01/14/24 appt noted:  Med :  duloxetine  60, Concerta  27 mg AM, Lyrica  100 HS,.  Received Spravato 84 mg today.   No SE Experienced very mild dissociation and no sig HA, N.  But some dizziness.  Resolved by end of 2 hours observation.  Mildly drowsy at end of session.  Persistent weakness requiring WC when he leaves. Still sees consistent benefit with Spravato and function better.  No problems with meds and no med changes wanted.    ECT-MADRS    Flowsheet Row Office Visit from 06/18/2023 in [facility] Office Visit from 04/16/2023 in [facility]  MADRS Total Score 37 36   PHQ2-9    Flowsheet Row Office Visit from 03/15/2020 in [facility] Total Score 3  PHQ-9 Total Score 9     B schizophrenic SUI. After M's death. PCP Cecil Ee at [facility] at 73 years old.  Prior psychiatric medication trials include  Lexapro  20, citalopram NR, clomipramine weight gain, paroxetine, fluoxetine, Luvox, Trintellix,   Increase Lexapro  back to 20 mg January 2020. & 10/2020 bupropion ,  Auvelity NR  Abilify  10 fatigue,  Cerefolin NAC,  and   Naltrexone  sexual SE Lyrica  150 tired  pramipexole,  ropinirole  Adderall XR & IR sexual SE,  Ritalin  30, Concerta  72 mg AM NR modafinil  and Nuvigil,   History [employer] OCD  Review of Systems:  Review of Systems  Constitutional:  Positive for fatigue. Negative for fever.  Cardiovascular:  Positive for palpitations.  Genitourinary:        ED  Musculoskeletal:  Positive for arthralgias, gait problem and myalgias.  Neurological:  Positive for weakness. Negative for dizziness, tremors and syncope.  Psychiatric/Behavioral:  Negative for agitation, behavioral problems, confusion, decreased concentration, dysphoric mood, hallucinations, self-injury, sleep disturbance and suicidal ideas. The patient is nervous/anxious. The patient is not hyperactive.     Medications: I have reviewed the patient's current medications.  Current Outpatient Medications  Medication Sig Dispense Refill   ALPRAZolam  (XANAX ) 0.25 MG tablet Take 1 tablet (0.25 mg total) by mouth 2 (two) times daily as needed for anxiety or sleep. 30 tablet 1   aspirin 81 MG tablet Take 81 mg by mouth daily.     Cholecalciferol (VITAMIN D -3) 125 MCG (5000 UT) TABS Take 1 tablet (5,000 Units total) by mouth daily. 90 tablet 1   DULoxetine  (CYMBALTA ) 30 MG capsule Take 3 capsules (90 mg total) by mouth daily. 270 capsule 0   methylphenidate  (CONCERTA ) 27 MG PO CR tablet Take 1 tablet (27 mg total) by mouth every morning. 30 tablet 0   naltrexone  (DEPADE) 50 MG tablet Take 0.5  tablets (25 mg total) by mouth daily. 45 tablet 1   pregabalin  (LYRICA ) 50 MG capsule Take 1 capsule (50 mg total) by mouth at bedtime. 90 capsule 0   simvastatin (ZOCOR) 20 MG tablet Take 20 mg by mouth at bedtime.     ZEPBOUND  10 MG/0.5ML Pen Inject 10 mg into the skin once a week.     No current facility-administered medications for this visit.    Medication Side Effects: None sexual SE are better not  All gone.  Allergies:  Allergies  Allergen Reactions   E.E.S. [Erythromycin] Hives   Macrolides And Ketolides Other (See Comments)    EES    Rosuvastatin     Other reaction(s): cramps    Past Medical History:  Diagnosis Date   Allergy    Anemia    iron- pt denies    Anxiety    Aortic cusp regurgitation    Carotid artery occlusion    Constipation    Coronary artery stenosis    Hyperlipidemia    Lactose intolerance    Major depression, recurrent, chronic    Obesity    OCD (obsessive compulsive disorder)    OSA (obstructive sleep apnea)    Other chronic pain    Periodic limb movement disorder    Periodic limb movements of sleep    Prediabetes    Pure hypercholesterolemia    Restless legs    Sleep apnea    wears cpap    Vitamin D  deficiency     Family History  Problem Relation Age of Onset   Cancer Mother        breast and ovarian   Anxiety disorder Mother    Breast cancer Mother    Ovarian cancer Mother    Depression Father        bi-polar   Hyperlipidemia Father    Heart disease Father    Sudden death Father    Bipolar disorder Father    Sleep apnea Father    Obesity Father  Depression Son    Colon cancer Neg Hx    Colon polyps Neg Hx    Esophageal cancer Neg Hx    Rectal cancer Neg Hx    Stomach cancer Neg Hx     Social History   Socioeconomic History   Marital status: Married    Spouse name: Not on file   Number of children: Not on file   Years of education: Not on file   Highest education level: Not on file  Occupational History    Occupation: retired pensions consultant  Tobacco Use   Smoking status: Never   Smokeless tobacco: Never  Substance and Sexual Activity   Alcohol use: Yes    Comment: occasionally    Drug use: No   Sexual activity: Not on file  Other Topics Concern   Not on file  Social History Narrative   Not on file   Social Drivers of Health   Tobacco Use: Low Risk (01/20/2024)   Patient History    Smoking Tobacco Use: Never    Smokeless Tobacco Use: Never    Passive Exposure: Not on file  Financial Resource Strain: Not on file  Food Insecurity: No Food Insecurity (01/25/2022)   [facility] from [facility] visits prior to 03/24/2022., [facility]   Epic    Within the past 12 months, you worried that your food would run out before you got the money to buy more.: Never true    Within the past 12 months, the food you bought just didn't last and you didn't have money to get more.: Never true  Transportation Needs: No Transportation Needs (01/25/2022)   Received from [employer], [facility] visits prior to 03/24/2022.   PRAPARE - Administrator, Civil Service (Medical): No    Lack of Transportation (Non-Medical): No  Physical Activity: Not on file  Stress: Not on file  Social Connections: Not on file  Intimate Partner Violence: Not on file  Depression (EYV7-0): Not on file  Alcohol Screen: Not on file  Housing: Unknown (06/30/2023)   Received from [facility]   Epic    Unable to Pay for Housing in the Last Year: Not on file    Number of Times Moved in the Last Year: Not on file    At any time in the past 12 months, were you homeless or living in a shelter (including now)?: No  Utilities: Not At Risk (01/25/2022)   [facility] from [facility] visits prior to 03/24/2022.   [facility] Utilities    Threatened with loss of utilities: No  Health Literacy: Not on file    Past Medical History, Surgical history, Social history, and  Family history were reviewed and updated as appropriate.   Please see review of systems for further details on the patient's review from today.   Objective:   Physical Exam:  There were no vitals taken for this visit.  Physical Exam Constitutional:      General: He is not in acute distress.    Appearance: He is obese.  Musculoskeletal:        General: No deformity.  Neurological:     Mental Status: He is alert and oriented to person, place, and time.     Cranial Nerves: No dysarthria.     Motor: Weakness present.     Coordination: Coordination abnormal.     Comments: Shortened gait is better.  No sig tremor.  less Slow  but still not normal.  Psychiatric:        Attention and Perception: Attention and perception normal. He does not perceive auditory or visual hallucinations.        Mood and Affect: Mood is not anxious or depressed. Affect is not labile, blunt or tearful.        Speech: Speech normal.        Behavior: Behavior is not slowed. Behavior is cooperative.        Thought Content: Thought content normal. Thought content is not paranoid or delusional. Thought content does not include homicidal or suicidal ideation. Thought content does not include suicidal plan.        Cognition and Memory: Cognition and memory normal.        Judgment: Judgment normal.     Comments: Insight intact Depression manageable. OCD is about the same and not the main problem More expressive and more normal affect.  Quicker responses.    November 06, 2018: [place] [facility] test in office within normal limits MMSE 28/30. Animal fluency 17 . (borderline) Taken as a whole, no indication to pursue neuropsychological testing.  Mini-Mental status exam 28/30 on 10/27/20.  No evidence of dementia.  Lab Review:   Vitamin D  level acceptable at 54.5.   Normal B12 and folate and TSH in last couple of years..  Echocardiogram is stable re: AVR over the last 8 years and not likely the cause of  lethargy.   06/28/23 MRI head:  IMPRESSION: 1. No acute intracranial abnormality. 2. 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 Head CT may be helpful for further assessment of the degree of mineralization. 3. Findings of chronic small vessel ischemia.  SABRAres Assessment: Plan:    Andyn was seen today for follow-up, depression, anxiety, fatigue, add and sleeping problem.  Diagnoses and all orders for this visit:  Recurrent major depression resistant to treatment  Mixed obsessional thoughts and acts  Attention deficit hyperactivity disorder (ADHD), predominantly inattentive type  Hypersomnia with sleep apnea  Mild cognitive impairment  Restless legs syndrome  Other chronic pain  Erectile disorder, acquired, generalized, moderate     Mr. Beckford has a long history of depression and OCD.  Major depression with fatigue, cognitive and physical slowness, anhedonia is much worse.  Started Spravato  and improving.  Was better energy with duloxetine  90 vs Lexapro  so increased to 120 mg daily DT worsening depression.    But it was not better. So reduced it back to 90 mg daily.    Now reduced to 60 mg daily to see if ED better and also bc recent U retention.  (Not likely related) now dep bettter with Spravato. Disc risk worsening dep and anxiety.   He has some compulsive checking and obsessions around the house maintenance.  When travels then tends to have less OCD bc triggered less.    Received 84 mg Spravato today. The patient experienced the typical dissociation which gradually resolved over the 2-hour period of observation.  There were no complications.  Specifically the patient did not have nausea or vomiting or headache.  Blood pressures remained within normal ranges at the 40-minute and 2-hour follow-up intervals.  By the time the 2-hour observation period  was met the patient was alert and oriented and able to exit without assistance.  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  See  nursing note for further details. Emphasized need to relax with Spravato and not return calls, read, return chats, emails etc. Started Spravato 06/25/23. continue weekly @ 84 mg Strongly rec avoid making any decisions nor dealing with any verbal information during the Spravato procedure.  He is not compliant so far with this.  Disc in detail with him and his wife.  His OCD is chronic with checking lites, stove, etc. .  It is better at the moment DT depression being worse.  Disc CBT techniques and potential for more therapy to address. Option Dr. Marijean.  Concerta  to 27  mg AM to reduce SE.   He wants to use it for depression and cognitive concerns.  Discussed potential benefits, risks, and side effects of stimulants with patient to include increased heart rate, palpitations, insomnia, increased anxiety, increased irritability, or decreased appetite.  Instructed patient to contact office if experiencing any significant tolerability issues.  Extensive discussion of sleep study and he has a copy.  Why is there virtually no N3 & REM sleep?  Is that affecting daytime alertness and fatigue?  Vs how much is related to mild OSA and PLMS?  Disc this in detail.  Wrote correspondence with sleep doc about it.  Options for sleep & fatigue:  Difficult to assess  bc has been out of country and then sick for 3 weeks.   Focus on reduction in OSA Focus on improving deep stage sleep.  ? Rx low dose mirtazapine or alternatives Focus on leg movements (hx RLS/PLMS) continue Trial as had been suggested Lyrica  for FM type sx and may help RLS/PLMS.  Better dreaming on 100 mg HS and too sleepy on 150.  Decreased  to 100 mg HS for sleep and back pain and RLS No ropinirole  needed.   Disc SE.  Not having  any.   Use LED Xanax  and try to avoid BZ daytime bc fatigue.  He doesn't use it much daythime.  Using 0..25-0.5 mg at night.  May need less with more Lyrica  at [facility] and try to minimize.  No hangover. We discussed the short-term risks associated with benzodiazepines including sedation and increased fall risk among others.  Discussed long-term side effect risk including dependence, potential withdrawal symptoms, and the potential eventual dose-related risk of dementia.  But recent studies from 2020 dispute this association between benzodiazepines and dementia risk. Newer studies in 2020 do not support an association with dementia.  Stimulant partially successfully used off label to augment antidepressants for depression and have resulted in improved productivity and attention.    Previous screening of memory was not suggestive of any neuro degenerative process: Mini-Mental status exam 28/30 on 10/27/20.   Recent cognition worse as dep worsened.  Also gait is short and slow.   neuro appt.  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  Disc Dr. Collie note and question of whether mobility px related to hx Vraylar .  No AIM noted but reports at times with have invol tongue protrusion.  Not uncomfortable.  No movements abnormal noted today  Plan:  duloxetine  60 mg daily to reduce ED  Lyrica  100 mg HS.   Continue [facility] ER to 27 mg AM,    Spravato disc in detail.  He wants to continue.  He and wife notice he's better.  Administer now drop back to weekly  He's still improving.   He is tolerating it better  Follow-up   Lorene Macintosh MD, DFAPA.  Please see After Visit Summary for patient specific instructions.  Future Appointments  Date Time Provider Department Center  01/21/2024  9:00 AM CP-NURSE [facility] None  01/21/2024  9:00 AM Teresa Rogue A, NP [facility] None  01/30/2024  3:00 PM Tat, Asberry RAMAN, DO [facility] None  04/02/2024  8:00 AM Gilgannon, Chloe N, PT [facility]     No orders of the defined types  were placed in this encounter.      -------------------------------

## 2024-01-21 ENCOUNTER — Ambulatory Visit

## 2024-01-21 ENCOUNTER — Ambulatory Visit (INDEPENDENT_AMBULATORY_CARE_PROVIDER_SITE_OTHER): Admitting: Behavioral Health

## 2024-01-21 VITALS — BP 138/84 | HR 68

## 2024-01-21 DIAGNOSIS — F339 Major depressive disorder, recurrent, unspecified: Secondary | ICD-10-CM

## 2024-01-21 NOTE — Progress Notes (Signed)
 Eric [name] [id number] 09-19-1950 73 y.o.  Subjective:   Patient ID:  Eric [name] is a 73 y.o. (DOB 12/17/1950) male.  Chief Complaint: No chief complaint on file.   HPI Timotheus Salm. Ledyard presents to the office today for follow-up of  treatment resistant depression (TRD).  Patient was administered Spravato 84mg  intranasally today. Patient was observed by provider throughout Spravato treatment. The patient experienced the typical dissociation which gradually resolved over the 2-hour period of observation. There were no complications. Specifically, the patient did not have any untoward side effects - feeling disconnected from themself, their thoughts, feelings and things around them, dizziness, nausea, feeling sleepy, decreased feeling of sensitivity (numbness) spinning sensation, feeling anxious, lack of energy, increased blood pressure, feeling happy or very excited, or headache. Blood pressures remained within normal ranges at the 40-minute and 2-hour follow-up intervals. By the time the 2-hour observation period was met the patient was alert and oriented and able to exit without assistance. Patient willing to continue Spravato administration for the treatment of resistant depression.    Reports Depression 2/10 today. Feels like depression is starting to improve.  Patient was sleeping and had to be aroused.  Cognitively sound.  Said that session was good today.  Wife is her to provide transportation.  No question or concerns this visit.                   ECT-MADRS    Flowsheet Row Office Visit from 06/18/2023 in [facility] Office Visit from 04/16/2023 in [facility]  MADRS Total Score 37 36   PHQ2-9    Flowsheet Row Office Visit from 03/15/2020 in [facility] Total Score 3  PHQ-9 Total Score 9     Review of Systems:  Review of Systems   Constitutional: Negative.   Allergic/Immunologic: Negative.   Neurological: Negative.   Psychiatric/Behavioral:  Positive for dysphoric mood.     Medications: I have reviewed the patient's current medications.  Current Outpatient Medications  Medication Sig Dispense Refill   ALPRAZolam  (XANAX ) 0.25 MG tablet Take 1 tablet (0.25 mg total) by mouth 2 (two) times daily as needed for anxiety or sleep. 30 tablet 1   aspirin 81 MG tablet Take 81 mg by mouth daily.     Cholecalciferol (VITAMIN D -3) 125 MCG (5000 UT) TABS Take 1 tablet (5,000 Units total) by mouth daily. 90 tablet 1   DULoxetine  (CYMBALTA ) 30 MG capsule Take 3 capsules (90 mg total) by mouth daily. 270 capsule 0   methylphenidate  (CONCERTA ) 27 MG PO CR tablet Take 1 tablet (27 mg total) by mouth every morning. 30 tablet 0   naltrexone  (DEPADE) 50 MG tablet Take 0.5 tablets (25 mg total) by mouth daily. 45 tablet 1   pregabalin  (LYRICA ) 50 MG capsule Take 1 capsule (50 mg total) by mouth at bedtime. 90 capsule 0   simvastatin (ZOCOR) 20 MG tablet Take 20 mg by mouth at bedtime.     ZEPBOUND  10 MG/0.5ML Pen Inject 10 mg into the skin once a week.     No current facility-administered medications for this visit.    Medication Side Effects: None  Allergies: Allergies[1]  Past Medical History:  Diagnosis Date   Allergy    Anemia    iron- pt denies    Anxiety    Aortic cusp regurgitation    Carotid artery occlusion    Constipation    Coronary  artery stenosis    Hyperlipidemia    Lactose intolerance    Major depression, recurrent, chronic    Obesity    OCD (obsessive compulsive disorder)    OSA (obstructive sleep apnea)    Other chronic pain    Periodic limb movement disorder    Periodic limb movements of sleep    Prediabetes    Pure hypercholesterolemia    Restless legs    Sleep apnea    wears cpap    Vitamin D  deficiency     Past Medical History, Surgical history, Social history, and Family history were  reviewed and updated as appropriate.   Please see review of systems for further details on the patient's review from today.   Objective:   Physical Exam:  There were no vitals taken for this visit.  Physical Exam Constitutional:      General: He is not in acute distress.    Appearance: Normal appearance.  Neurological:     Mental Status: He is alert and oriented to person, place, and time.     Gait: Gait normal.  Psychiatric:        Attention and Perception: Attention and perception normal. He does not perceive auditory or visual hallucinations.        Mood and Affect: Mood and affect normal. Mood is not anxious or depressed. Affect is not labile.        Speech: Speech normal.        Behavior: Behavior normal. Behavior is cooperative.        Thought Content: Thought content normal.        Cognition and Memory: Cognition and memory normal.        Judgment: Judgment normal.     Lab Review:  No results found for: NA, K, CL, CO2, GLUCOSE, BUN, CREATININE, CALCIUM, PROT, ALBUMIN, AST, ALT, ALKPHOS, BILITOT, GFRNONAA, GFRAA  No results found for: WBC, RBC, HGB, HCT, PLT, MCV, MCH, MCHC, RDW, LYMPHSABS, MONOABS, EOSABS, BASOSABS  No results found for: POCLITH, LITHIUM    No results found for: PHENYTOIN, PHENOBARB, VALPROATE, CBMZ   .res Assessment: Plan:    Recommendations:   Sleeping and had to arouse.  Needs assistance walking. No questions or concerns this visit. Requesting refills   Continue Spravato treatment.  Will continue to f/u with Lorene Macintosh MD for medication management.     Redell A. Rashena Dowling, NP   Diagnoses and all orders for this visit:   Recurrent major depression resistant to treatment       There are no diagnoses linked to this encounter.   Please see After Visit Summary for patient specific instructions.  Future Appointments  Date Time Provider Department Center  01/30/2024  3:00  PM Tat, Asberry RAMAN, DO [facility] None  04/02/2024  8:00 AM Gilgannon, Chloe N, PT [facility]    No orders of the defined types were placed in this encounter.   -------------------------------    [1]  Allergies Allergen Reactions   E.E.S. [Erythromycin] Hives   Macrolides And Ketolides Other (See Comments)    EES    Rosuvastatin     Other reaction(s): cramps

## 2024-01-21 NOTE — Progress Notes (Signed)
 NURSES NOTE:         Pt arrived for his #38 Spravato Treatment for treatment resistant depression, the starting dose was 56 mg (2 of the 28 mg nasal sprays). Pt is getting 84 mg again today after receiving 3 of the 56 mg the past 3 weeks due to sedation.  Eric [name] is a patient of Dr. Calhoun so he will follow his care throughout treatments and follow ups. Pt's Spravato is a medical authorization through buy and bill.  Spravato medication is stored at treatment center per REMS/FDA guidelines. The medication is required to be locked behind two doors per REMS/FDA protocol. Medication is also disposed of properly after each use per regulations. All documentation for REMS is completed and submitted per FDA/REMS requirements.          Began taking patient's vital signs at 9:10 AM 141/76, pulse 67, SpO2 97%. Instructed patient to blow his nose if needed then recline back to a 45 degree angle. Gave patient first dose 28 mg nasal spray, administered in each nostril as directed and observed by nurse, waited 5 more minutes for the second and third dose.  After all doses given pt did not complain of any nausea/vomiting. Pt was given water, snacks, and candy. Assessed his 40 minute vitals, 9:50 AM, 130/85, pulse 66, SpO2 99%. Explained he would be monitored for a total time of 120 minutes. Discharge vitals were taken at 11:33 AM 138/84, P 68, SpO2 97%. [name], NP met with pt. to discuss his treatment once his thoughts were clearer. Recommend he go home and sleep or just relax on the couch. No driving, no intense activities. Verbalized understanding. Nurse was with pt a total of 60 minutes for clinical assessment. Pt's son picked him up after treatment today. Instructed to call with any issues. Pt will return after his new insurance for 2026 has approved Spravato treatments.   (84 mg) LOT [id number] EXP AUG 2028

## 2024-01-28 ENCOUNTER — Encounter: Admitting: Psychiatry

## 2024-01-28 ENCOUNTER — Encounter

## 2024-01-29 NOTE — Progress Notes (Signed)
 "   Assessment/Plan:  1.  Parkinsonism             - Possibly due to exposure to antipsychotic medication but he was not on either Abilify  or Vraylar  for very long.  He has now been off all antipsychotics for over 6 months and I am seeing more tremor today, although they think his walk is better, but I am definitely seeing parkinsonism in his walk still             - Referral for DaTscan   2.  Tardive dyskinesia             - Patient does definitely have some tardive movements of his tongue.  This is mild and not bothersome   3.  Abnormal brain scan             -there is heavy basal ganglia calcification in the BG, thalami, cerebellum.  I am going to send him to endocrinology just to make sure no endocrinopathy (parathyroid, etc) as etiology.  Certainly, basal ganglia calcifications can be idiopathic as well as well as exposure to toxins (carbon monoxide, mercury, lead, etc.).  doubt fahrs             - Patient does have some mild small vessel disease on MRI.  We discussed nature and etiology of small vessel disease in this age group with his medical problems.  4.  Neurogenic bladder  - Patient following with urology.  He is performing self catheterization.  -he is following with Dr. Elisabeth  5.  Depression  -on spravato  Subjective:   Eric [name] was seen today in follow-up for parkinsonism.  Patient previously on Abilify  for short time and then on Vraylar  for a short time.  His Vraylar  was discontinued in June, 2025, which was around the time that I last saw him.  Patient also has a history of tardive dyskinesia from the above medications.  He did have a CT brain since last visit, confirming the prominent mineralization in the basal ganglia, thalami, cerebellum.  We had previously asked the patient to follow-up with primary care for consideration of testing of any metabolic derangements, including parathyroid function.  He reports today that he isn't sure that he did that.  Overall,  he is doing better.  His gait is better.  Tremor in the R hand persists.    Separately, he does tell me that he was diagnosed with neurogenic bladder since last visit.  He is following with urology.  He is performing self-catheterization.  He does receive Spravato from psychiatry.  Notes are reviewed.  Notes indicate that patient does become significantly weak with the medication and requires wheelchair when he leaves.  ALLERGIES:   Allergies  Allergen Reactions   E.E.S. [Erythromycin] Hives   Macrolides And Ketolides Other (See Comments)    EES    Rosuvastatin     Other reaction(s): cramps    CURRENT MEDICATIONS:  Current Meds  Medication Sig   ALPRAZolam  (XANAX ) 0.25 MG tablet Take 1 tablet (0.25 mg total) by mouth 2 (two) times daily as needed for anxiety or sleep.   aspirin 81 MG tablet Take 81 mg by mouth daily.   Cholecalciferol (VITAMIN D -3) 125 MCG (5000 UT) TABS Take 1 tablet (5,000 Units total) by mouth daily.   Cholecalciferol 50 MCG (2000 UT) CAPS 1 tablet Orally Once a day   DULoxetine  (CYMBALTA ) 30 MG capsule Take 60 mg by mouth daily.   Esketamine HCl,  84 MG Dose, (SPRAVATO, 84 MG DOSE,) 28 MG/DEVICE SOPK 3 sprays in each nostril Nasally Twice a week Starts this dose on 06/27/2023   methylphenidate  (CONCERTA ) 27 MG PO CR tablet Take 1 tablet (27 mg total) by mouth every morning.   naltrexone  (DEPADE) 50 MG tablet Take 0.5 tablets (25 mg total) by mouth daily.   pregabalin  (LYRICA ) 100 MG capsule Take 100 mg by mouth daily.   simvastatin (ZOCOR) 20 MG tablet Take 20 mg by mouth at bedtime.   ZEPBOUND  15 MG/0.5ML Pen Inject 15 mg into the skin once a week.   [DISCONTINUED] DULoxetine  (CYMBALTA ) 30 MG capsule Take 3 capsules (90 mg total) by mouth daily.   [DISCONTINUED] pregabalin  (LYRICA ) 50 MG capsule Take 1 capsule (50 mg total) by mouth at bedtime.   [DISCONTINUED] ZEPBOUND  10 MG/0.5ML Pen Inject 10 mg into the skin once a week.     Objective:   VITALS:   Vitals:    01/30/24 1508  BP: 100/60  Pulse: 60  SpO2: 98%  Weight: 254 lb (115.2 kg)  Height: 5' 8 (1.727 m)     GEN:  The patient appears stated age and is in NAD. HEENT:  Normocephalic, atraumatic.  The tongue is thick but tongue is in the mouth with some movements involuntarily.  The mucous membranes are moist.  CV:  RRR Lungs:  CTAB Neck/HEME:  There are no carotid bruits bilaterally.  Neurological examination:  Orientation: The patient is alert and oriented x3.  Cranial nerves: There is good facial symmetry. Extraocular muscles are intact. The visual fields are full to confrontational testing. The speech is fluent and clear. Soft palate rises symmetrically and there is no tongue deviation. Hearing is intact to conversational tone. Sensation: Sensation is intact to light touch throughout Motor: Strength is at least antigravity x 4  Movement examination: Tone: There is normal tone in the bilateral upper extremities.  The tone in the lower extremities is normal.  Abnormal movements: there is rare RUE rest tremor Coordination:  There is no decremation with RAM's, with any form of RAMS, including alternating supination and pronation of the forearm, hand opening and closing, finger taps, heel taps and toe taps.  Gait and Station: The patient has  difficulty arising out of a deep-seated chair without the use of the hands. The patient's stride length is decreased with decreased arm swing and a RUE rest tremor.     Total time spent on today's visit was 32 minutes, including both face-to-face time and nonface-to-face time.  Time included that spent on review of records (prior notes available to me/labs/imaging if pertinent), discussing treatment and goals, answering patient's questions and coordinating care.  Cc:  Koirala, Dibas, MD  "

## 2024-01-30 ENCOUNTER — Ambulatory Visit (INDEPENDENT_AMBULATORY_CARE_PROVIDER_SITE_OTHER): Admitting: Neurology

## 2024-01-30 DIAGNOSIS — G20C Parkinsonism, unspecified: Secondary | ICD-10-CM

## 2024-01-30 DIAGNOSIS — R9402 Abnormal brain scan: Secondary | ICD-10-CM | POA: Diagnosis not present

## 2024-01-30 DIAGNOSIS — G2401 Drug induced subacute dyskinesia: Secondary | ICD-10-CM | POA: Diagnosis not present

## 2024-01-30 NOTE — Patient Instructions (Addendum)
 As we discussed, we are going to do a DaT scan.  We discussed that this is not a diagnostic scan, but will just give us  some information on dopamine levels in the brain.  Here is some information which may be helpful to you.  Before the Exam  Please tell the nurse, nuclear imaging technician or nuclear medicine physician if you are pregnant, nursing or have reduced liver function. Please also inform us  if you have an allergy or sensitivity to iodine.  The test may be completed with those who are allergic to iodine, but may require pre-medication with other medications to help avoid reactions. If you need to cancel the examination, please give us  at least 24 hours notice.  Before your scan, stop taking these medicines for the length of time shown: Name of Drug Stop Taking  Amoxapine 4 days before  Benztropine  Cogentin 3 days before  Bupropion  (Aplenzin , Budeprion, Voxra, Wellbutrin , Zyban ) 8 days before  Concerta  3 days  duloxetine  3 days  Cocaine 6 hours before  Escitalopram  24 hours before  Methamphetamine 24 hours before  Methylphenidate  (Concerta , Metadate , Methylin , Ritalin ) 20 hours before  Paroxetine 24 hours before  Selegilene 48 hours before  Sertraline 3 days before    On the Day of the Exam Drink plenty of fluids and go to the bathroom frequently (and for two days after your exam) Wear loose comfortable clothing, since you will need to lie still for a period of time. Please bring a list of all medications that you are taking; name and dosage. We want to make your waiting time as pleasant as possible. Consider bringing your favorite magazine, book or music player to help you pass the time.  You do not need to stay at the imaging facility the entire time, between the initial injection and the scan itself.   Please leave your jewelry and valuables at home.  During the Exam The DaTscan once started takes approximately 30-45 minutes. However, following injection of the DaT  agent approximately 3-6 hours are required before the agent has achieved appropriate concentration in the brain.  We will inject the DaTscan through an intravenous (IV) line into your arm in the AM, usually around 8-9am, and then you will come back usually in the mid afternoon for the scan. Before the exam, you will receive a drug to allow you to protect the thyroid . For the imaging test, you will be asked to lie on a table and an imaging technologist will position your head in a headrest. A strip of tape or a flexible restraint may be placed around your head to help you to not move your head during the scan. A camera will be positioned above you and you must remain very still for about 30 minute while images are taken. The scanner will be very close to your head, but will not touch your head.

## 2024-02-03 ENCOUNTER — Telehealth: Payer: Self-pay

## 2024-02-03 ENCOUNTER — Other Ambulatory Visit: Payer: Self-pay

## 2024-02-03 DIAGNOSIS — R251 Tremor, unspecified: Secondary | ICD-10-CM

## 2024-02-03 NOTE — Telephone Encounter (Signed)
 Pateit approved for Dat Scan will you please scan into chart when you have time. Papers are in the box in the back Thank You

## 2024-02-04 ENCOUNTER — Ambulatory Visit: Admitting: Psychiatry

## 2024-02-04 ENCOUNTER — Encounter: Payer: Self-pay | Admitting: Psychiatry

## 2024-02-04 ENCOUNTER — Ambulatory Visit

## 2024-02-04 VITALS — BP 133/74 | HR 68

## 2024-02-04 DIAGNOSIS — G2581 Restless legs syndrome: Secondary | ICD-10-CM

## 2024-02-04 DIAGNOSIS — F339 Major depressive disorder, recurrent, unspecified: Secondary | ICD-10-CM

## 2024-02-04 DIAGNOSIS — G3184 Mild cognitive impairment, so stated: Secondary | ICD-10-CM

## 2024-02-04 DIAGNOSIS — F900 Attention-deficit hyperactivity disorder, predominantly inattentive type: Secondary | ICD-10-CM

## 2024-02-04 DIAGNOSIS — F5221 Male erectile disorder: Secondary | ICD-10-CM

## 2024-02-04 DIAGNOSIS — G4710 Hypersomnia, unspecified: Secondary | ICD-10-CM

## 2024-02-04 DIAGNOSIS — F422 Mixed obsessional thoughts and acts: Secondary | ICD-10-CM

## 2024-02-04 MED ORDER — METHYLPHENIDATE HCL ER (OSM) 36 MG PO TBCR: 36.0000 mg | EXTENDED_RELEASE_TABLET | ORAL | 0 refills | Status: AC

## 2024-02-04 NOTE — Progress Notes (Signed)
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   Sometimes poor sleep. Plan  no changes  01/21/23 appt noted: Worrying too much bc OCD.  Trying to decide about how to deal with a trigger lately.  OCD driving me crazy wanting to make things perfect.   Other than the trigger had a nice time since here.  GS here for 5 days at 74 yo.  Enjoyed that.   Taking semaglutide   down to 250# from 283#. Card at [facility] says he does not have Aortic aneurysm.  Measuring is OK.  Will FU with MRI in June.   Some diarrhea lately. Cannot stop worrying.  Triggered by the plumbing issue. Meds as above.  No SE of sig.   Plan:  switched to duloxetine  to 90 mg daily.  Off Lexapro .  Others as noted. Lyrica  100 mg HS. Has held Ritalin  10 TID, can resume as long as SBP below 140 per card.  none needed ropinirole  3 mg pm.  No risperidone .  02/18/23 appt noted:  with Eric [name] Meds: as above. Taking Ritalin  30% of night.  Prn Xanax  rarely if gets off sleep cycle. No SE.   Terribly dep but not as bad as in the past. Taking Ritalin  appears to help significantly and started doing that.  Tend to stay in bed till noon but pattern of up late.   OCD about the same with some avoidance of detail work.   Afraid I'll throw away something I need.   She doesn't know whether Ritalin  helps No sig RLS and wife agrees. Thinks of deceased B at the holidays but worries over son Deward too.   Plan: Increase duloxetine  to 120 mg daily.  Off Lexapro .  Others as noted. Lyrica  100 mg HS.  resumed Ritalin  30 AM  with some benefit. no risperidone .  03/19/23 appt noted: Psych med:  duloxetine  120, Ritalin  20 am  missing doses, Lyrica  100 HS, no risperidone , no ropinirole .   No improvement in depression since increase dose duloxetine .  More dep than usual.  Worrying about everything.  Including taxes.  All I can see is a black hole.  Making him feel negative about everything.   Keeping him from doing things.   OCD is not much different from when on Lexapro .  Wife agrees dep and distracted. Plan: resumed Ritalin  30 AM with some benefit. Resume Abilify  2 mg AM for dep.  04/16/23 appt noted:  wife here Psych med:  duloxetine  120, resumed Concerta  36 mg AM, Lyrica  100 HS, Abilify  2, no ropinirole .   Wife noted he seemed manic yesterday.  Invited window estimate against wife's will and without her input.   No mania noted until yesterday. He didn't feel manic yesterday.   He's noticed no change with Abilify  and still feels flat and a little low.  From [facility] energy and mood a little better.  Sleeps until 10-11 am about 12 hours. And asleep that whole time. Wife says he doesn't eat much bc he's not awake much. Trouble with hygiene.   Plan: Meds: continue duloxetine  to 120 mg daily.  Off Lexapro .  Lyrica  100 mg HS.  resumed Ritalin  30 AM with some benefit. DC Abiilify Vraylar  1.5 mg every other day Spravato disc in detail.  He wants to pursue if not better with Vraylar .  06/18/23 appt noted: with W Med:  duloxetine  120, resumed Concerta  36 mg AM, Lyrica  100 HS, no ropinirole .   Vraylar  1.5 every other day, no benefit Spravato denied by insurance but is being appealed. More trouble walking and shorter gait.  Neuro  in [place] appt in Sept.   Looking to get in in [place].  Can't be much more dep than I am.   OCD residual when has to make a decision.   ED is more of a px. Reduced voice family. Plan : Spravato  06/25/23 appt noted: with W Med:  duloxetine  120, Concerta  36 mg AM, Lyrica  100 HS, no ropinirole .   Vraylar  1.5 daily No SE Received Spravato 56 mg today.  Experienced very mild dissociation and no sig HA, N.  But some dizziness.  Resolved by end of 2 hours observation.  Able to leave office without assistance.  Ongoing dep without change.  Slow, reduced cognition, anhedonia, forgetful, low motivation. No other concerns with meds.   06/27/23 appt noted:  Med: Med:  duloxetine  120, Concerta  36 mg AM, Lyrica  100 HS, no ropinirole .   Stopped Vraylar  1.5 daily No SE Received Spravato 84 mg todayfor the first time..  Experienced very mild dissociation and no sig HA, N.  But some dizziness.  Resolved by end of 2 hours observation.  Able to leave office without assistance.  Ongoing dep without change.  Slow, reduced cognition, anhedonia, forgetful, low motivation. No other concerns with meds.   07/02/23 appt oted: Med: Med:  duloxetine  90, Concerta  36 mg AM, Lyrica  100 HS, no ropinirole .   Stopped Vraylar  1.5 daily No SE Received Spravato 84 mg today.  Experienced very mild dissociation and no sig HA, N.  But some dizziness.  Resolved by end of 2 hours observation.  Mildly drowsy at end of session. BC of gait px he needed to use WC to leave the office.  Per wife gait gradually worsened over a couple of years but accelerated recently  in 3 mos or so.  Short gait.  Not due to pain.  Pending neuro appt.  MRI last week not read yet. No problems with meds. Wife noticed he's shown more initiative at home since [place] ex doing crossword puzzles.  More engaged.  He feels lighter already with it.  07/15/23 appt noted:  Med: Med:  duloxetine  90, Concerta  36 mg AM, Lyrica  100 HS, no ropinirole .  No SE Received Spravato  84 mg today.  Experienced very mild dissociation and no sig HA, N.  But some dizziness.  Resolved by end of 2 hours observation.  Mildly drowsy at end of session. BC of gait px he needed to use WC to leave the office. Seen with W.   W noted clear benefit Spravato with stronger voice, spontaneous chores he wasn't doing.  More engaged in coverstation.  Pt agrees. Disc concerns over gait px and his neuro visit and MRI scan suggesting Fahr's DZ.  He'll discuss further with DR. Tat tomorrow.  07/18/23 appt noted:  Med: Med:  duloxetine  90, Concerta  36 mg AM, Lyrica  100 HS, no ropinirole .  No SE Received Spravato 84 mg today.  Experienced very mild dissociation and no sig HA, N.  But some dizziness.  Resolved by end of 2 hours observation.  Mildly drowsy at end of session. BC of gait px he needed to use WC to leave the office. No med concerns.  Seen with W.    Both agree dep is better .  More affective range.  More socially engaged.  Quicker thought.  More active .  Less down and less negative.  07/23/23 appt noted: Med: Med:  duloxetine  90, Concerta  36 mg AM, Lyrica  100 HS, no ropinirole .  No SE Received Spravato 84 mg today.  Experienced very mild dissociation and no sig HA, N.  But some dizziness.  Resolved by end of 2 hours observation.  Mildly drowsy at end of session. BC of gait px he needed to use WC to leave the office. No med concerns.  Seen with W.    Dep is still improved.  But is dealing with poor gait, weak and slow.  Will start neurorehab today.   Anxiety re: OCD manageable. Thought and affect quicker and more responsive .  Humor returned.  07/25/23 appt:  Med: Med:  duloxetine  90, Concerta  36 mg AM, Lyrica  100 HS, no ropinirole .  No SE Received Spravato 84 mg today.  Experienced very mild dissociation and no sig HA, N.  But some dizziness.  Resolved by end of 2 hours observation.  Mildly drowsy at end of session. BC of gait px he needed to use WC to leave the office. No med  concerns.  Seen with W.    Overall mood still improving but not 100%.  Starting neurorehab for weakness.  No new med concerns.  Satisfied with meds.  07/30/23 appt noted:  Med: Med:  duloxetine  90, Concerta  36 mg AM, Lyrica  100 HS, no ropinirole .  No SE Received Spravato 84 mg today.  Experienced very mild dissociation and no sig HA, N.  But some dizziness.  Resolved by end of 2 hours observation.  Mildly drowsy at end of session. BC of gait px he needed to use WC to leave the office. No med concerns.  Seen with W.    Both agree mood is improving and activity and interest.  Not 100% but limited by mobility and starting PT.    08/15/23 appt noted:  Med:  duloxetine  90,  Concerta  36 mg AM, Lyrica  100 HS, no ropinirole .  No SE Received Spravato 56 mg today.  Experienced very mild dissociation and no sig HA, N.  But some dizziness.  Resolved by end of 2 hours observation.  Mildly drowsy at end of session.  But less than when on 84 mg. BC of gait px he needed to use WC to leave the office. No med concerns.  Seen with W.    Less drowsy at  end of time here than when got 84 mg daily. They both agree good response with dep and anxiety.  More involved and better interaction socially.  More initiative.  08/20/23 appt noted:  Med:  duloxetine  90, Concerta  36 mg AM, Lyrica  100 HS, no ropinirole .  No SE Received Spravato 56 mg today.  Experienced very mild dissociation and no sig HA, N.  But some dizziness.  Resolved by end of 2 hours observation.  Mildly drowsy at end of session.  But less than when on 84 mg. BC of gait px he needed to use WC to leave the office. No med concerns.  Seen with W.   She's still concerned he sleeps too much.  He's not sure why he does so.  Overall mood, initiative, socialization is better. Less drowsy at  end of time here than when got 84 mg daily.  08/27/23 appt noted:  Med:  duloxetine  90, Concerta  36 mg AM, Lyrica  100 HS, no ropinirole .  No SE Received Spravato 84 mg  today.  Experienced very mild dissociation and no sig HA, N.  But some dizziness.  Resolved by end of 2 hours observation.  Mildly drowsy at end of session.  Walking is some better with PT.  Wife agrees.  He's more interested, responsive, engaged.  Better cognition. After Spravato 56 he does not have to nap when goes home. After 84 mg needed to nap but he wants to increase back to 84 mg to get max effect. More sedate after 84 mg today but is able to clear by end of session.  But wants to continue it.  Over all doing pretty well  with less dep and more engagement and activity including spontaneously cleaning things.   09/10/23 appt noted:  Med:  duloxetine  90, Concerta  36 mg AM, Lyrica  100 HS, no ropinirole .  No SE Received Spravato 84 mg today.  Experienced very mild dissociation and no sig HA, N.  But some dizziness.  Resolved by end of 2 hours observation.  Mildly drowsy at end of session.  Mood continues to improve overall and anxiety better too.  Walking is improving.  Cognition and emotional engagment, activity are all better. Plan no med changes  09/24/23 appt noted: Med: Med:  duloxetine  90, Concerta  36 mg AM, Lyrica  100 HS, no ropinirole .  No SE Received Spravato 84 mg today.  Experienced very mild dissociation and no sig HA, N.  But some dizziness.  Resolved by end of 2 hours observation.  Mildly drowsy at end of session.  Mood continues to improve overall and anxiety better too.  Walking is improving.  Cognition and emotional engagment, activity are all better. Asks about reducing duloxetine  bc ED issues. Plan: reduce duloxetine  to 60 mg daily to reduce ED  10/08/23 appt Med: Med:  duloxetine  60, Concerta  36 mg AM, Lyrica  100 HS, no ropinirole .  Received Spravato 84 mg today.  Experienced very mild dissociation and no sig HA, N.  But some dizziness.  Resolved by end of 2 hours observation.  Mildly drowsy at end of session.  Some dysphoria this week but he and wife continue to see  improvement and benefit with meds and Spravato.   SE concerta  feeling jittery and skips it at times.   10/15/23 appt noted:  Med :  duloxetine  60, Concerta  36 mg AM, Lyrica  100 HS, no ropinirole .  Received Spravato 84 mg today.  Experienced very mild dissociation and no sig HA, N.  But some dizziness.  Resolved by end of 2 hours observation.  Mildly drowsy at end of session.  he and wife continue to see improvement and benefit with meds and Spravato.   Minimal depression now. Stressed by nearly scammed this week by caller, but wife stopped. It.  She is concerned bc in the past he would not have been so easily deceived.  However he says they were just good and he ultimately didn't get scammed.  No concerns with current meds. Plan reduce Concerta  to 27 mg AM  10/22/23 appt noted:  Med :  duloxetine  60, Concerta  36 mg AM, Lyrica  100 HS, no ropinirole .  Received Spravato 84 mg today.  Experienced very mild dissociation and no sig HA, N.  But some dizziness.  Resolved by end of 2 hours observation.  Mildly drowsy at end of session.  he and wife continue to see improvement and benefit with meds and Spravato.   Minimal depression now. Hasn't reduced Concerta  yet.   Is wearing a bladder cath bc U retention.   Suspected prostate px in workup.  Mood is still improving.  Will be here next week then going to see GS in [place] for a week. Plan: as noted reduce Concerta  2 AM  10/29/23 appt noted;  Med :  duloxetine  60, Concerta  36 mg AM, Lyrica  100 HS, no ropinirole .  Received Spravato 84 mg today.   Experienced very mild dissociation and no sig HA, N.  But some dizziness.  Resolved by end of 2 hours observation.  Mildly drowsy at end of session.  he and wife continue to see improvement and benefit with meds and Spravato.   Minimal depression now. Hasn't reduced Concerta  yet.   Is wearing a bladder cath bc U retention.   Frustrated over this but gets it out tomorrow. Pretty good with mood. No concerns with  meds.  Intermittent compliance with Concerta .   Wife concerns over poor judgment at times.  Like when makes financial decisions while receiving Spravato.  10/31/23 appt noted:  Med :  duloxetine  60, Concerta  36 mg AM, Lyrica  100 HS, no ropinirole .  Received Spravato 84 mg today.   Experienced very mild dissociation and no sig HA, N.  But some dizziness.  Resolved by end of 2 hours observation.  Mildly drowsy at end of session.  he and wife continue to see improvement and benefit with meds and Spravato.   Minimal depression now. Hasn't reduced Concerta  yet.   Is wearing a bladder cath bc U retention.   Frustrated over this. Mood is better markedly with Spravato.  Not as much OCD lately but distracted by health problems.    11/11/23 appt noted:  Med :  duloxetine  60, Concerta  36 mg AM, Lyrica  100 HS, no ropinirole .  Received Spravato 84 mg today.   Experienced very mild dissociation and no sig HA, N.  But some dizziness.  Resolved by end of 2 hours observation.  Mildly drowsy at end of session.  he and wife continue to see improvement and benefit with meds and Spravato.  Minimal depression now. Trip to [place] to visit family went well sitting with GS.  Missed a Spravato admin.   Dep remained manageable generally.  Wife indicated he was negative this am but he feels better after Spravato.  Disc concerns abotu gait gradually better.  OCD is better.   Plan reduce Concerta  to 27 mg am  11/26/23 appt noted: Med :  duloxetine  60, Concerta  27 mg AM, Lyrica  100 HS, no ropinirole .  Received Spravato 84 mg today.   Experienced very mild dissociation and no sig HA, N.  But some dizziness.  Resolved by end of 2 hours observation.  Mildly drowsy at end of session.  he and wife continue to see improvement and benefit with meds and Spravato.   Minimal depression now. Done fine with less Concerta .  Mood is better and function better and wife agrees.  Satisfied with meds.   12/17/23 appt noted:  Med :   duloxetine  60, Concerta  27 mg AM, Lyrica  100 HS, no ropinirole .  Received Spravato 84 mg today.   No SE Experienced very mild dissociation and no sig HA, N.  But some dizziness.  Resolved by end of 2 hours observation.  Mildly drowsy at end of session.  he and wife continue to see improvement and benefit with meds and Spravato.   Tendency to do tasks under influence of Spravato against recommendation of staff and his wife's desires.  Minimal depression now.  OCD is still present but less anxiety from it now.  Doing well with meds without concerns.  Strength is not normal but has improved over time.  12/31/23 appt noted:  Med :  duloxetine  60, Concerta  27 mg AM, Lyrica  100 HS,.  Received Spravato 84 mg today.   No SE Experienced very mild dissociation and no sig HA, N.  But some dizziness.  Resolved by end of 2 hours observation.  Mildly drowsy at end of session.  Persistent weakness requiring WC when he leaves. he and wife continue to see improvement and benefit with meds and Spravato.  Both agree he is not sig dep today nor recently.  Is still tired physically and still hard to walk.  OCD and anxiety are pretty well controlled to with current tx plan. No med changes wanted.  01/14/24 appt noted:  Med :  duloxetine  60, Concerta  27 mg AM, Lyrica  100 HS,.  Received Spravato 84 mg today.   No SE Experienced very mild dissociation and no sig HA, N.  But some dizziness.  Resolved by end of 2 hours observation.  Mildly drowsy at end of session.  Persistent weakness requiring WC when he leaves. Still sees consistent benefit with Spravato and function better.  No problems with meds and no med changes wanted.    02/04/24 appt noted:  Med: duloxetine  60, Concerta  27 mg AM, Lyrica  100 HS,.  Received Spravato 84 mg today.   No SE Experienced very mild dissociation and no sig HA, N.  But some dizziness.  Resolved by end of 2 hours observation.  Mildly drowsy at end of session.  Persistent weakness  requiring WC when he leaves. Still sees consistent benefit with Spravato and function better.  Mood gradually better. Asking to increase Concerta  for better energy and focus and productivity.  Has had his BP checked and reassured by PCP he should be able to tolerate higher Concerta .    ECT-MADRS    Flowsheet Row Office Visit from 06/18/2023 in [facility] Office Visit from 04/16/2023 in  [facility]  MADRS Total Score 37 36   PHQ2-9    Flowsheet Row Office Visit from 03/15/2020 in [facility] Total Score 3  PHQ-9 Total Score 9     B schizophrenic SUI. After M's death. PCP Eric [name] at [facility] at 74 years old.  Prior psychiatric medication trials include  Lexapro  20, citalopram NR, clomipramine weight gain, paroxetine, fluoxetine, Luvox, Trintellix,   Increase Lexapro  back to 20 mg January 2020. & 10/2020 bupropion ,  Auvelity NR  Abilify  10 fatigue,  Cerefolin NAC, and   Naltrexone  sexual SE Lyrica  150 tired  pramipexole,  ropinirole  Adderall XR & IR sexual SE,  Ritalin  30, Concerta  72 mg AM NR modafinil  and Nuvigil,   History Eric [name] OCD  Review of Systems:  Review of Systems  Constitutional:  Positive for fatigue. Negative for fever.  Cardiovascular:  Positive for palpitations. Negative for chest pain.  Genitourinary:        ED  Musculoskeletal:  Positive for arthralgias, gait problem and myalgias.  Neurological:  Positive for weakness. Negative for dizziness, tremors and syncope.  Psychiatric/Behavioral:  Negative for agitation, behavioral problems, confusion, decreased concentration, dysphoric mood, hallucinations, self-injury, sleep disturbance and suicidal ideas. The patient is nervous/anxious. The patient is not hyperactive.     Medications: I have reviewed the patient's current medications.  Current Outpatient Medications   Medication Sig Dispense Refill   ALPRAZolam  (XANAX ) 0.25 MG tablet Take 1 tablet (0.25 mg total) by mouth 2 (two) times daily as needed for anxiety or sleep. 30 tablet 1   aspirin 81 MG tablet Take 81 mg by mouth daily.     Cholecalciferol (VITAMIN D -3) 125 MCG (5000 UT) TABS Take 1 tablet (5,000 Units total) by mouth daily. 90 tablet 1   Cholecalciferol 50 MCG (2000 UT) CAPS 1 tablet Orally Once a day     DULoxetine  (CYMBALTA ) 30 MG capsule Take 60 mg by mouth daily.     Esketamine HCl, 84 MG Dose, (SPRAVATO, 84 MG DOSE,) 28 MG/DEVICE SOPK 3 sprays in each nostril Nasally Twice a week Starts this dose on 06/27/2023     naltrexone  (DEPADE) 50 MG tablet Take 0.5 tablets (25 mg total) by mouth daily. 45 tablet 1   pregabalin  (LYRICA ) 100 MG capsule Take 100 mg by mouth daily.     simvastatin (ZOCOR) 20 MG tablet Take 20 mg by mouth at bedtime.     ZEPBOUND  15 MG/0.5ML Pen Inject 15 mg into the skin once a week.     methylphenidate  (CONCERTA ) 36 MG PO CR tablet Take 1 tablet (36 mg total) by mouth every morning. 30 tablet 0   No current facility-administered medications for this visit.    Medication Side Effects: None sexual SE are better not  All gone.  Allergies:  Allergies  Allergen Reactions   E.E.S. [Erythromycin] Hives   Macrolides And Ketolides Other (See Comments)    EES    Rosuvastatin     Other reaction(s): cramps    Past Medical History:  Diagnosis Date   Allergy    Anemia    iron- pt denies    Anxiety    Aortic cusp regurgitation    Carotid artery occlusion    Constipation    Coronary artery stenosis    Hyperlipidemia    Lactose intolerance    Major depression, recurrent, chronic    Obesity    OCD (obsessive compulsive disorder)  OSA (obstructive sleep apnea)    Other chronic pain    Periodic limb movement disorder    Periodic limb movements of sleep    Prediabetes    Pure hypercholesterolemia    Restless legs    Sleep apnea    wears cpap    Vitamin D   deficiency     Family History  Problem Relation Age of Onset   Cancer Mother        breast and ovarian   Anxiety disorder Mother    Breast cancer Mother    Ovarian cancer Mother    Depression Father        bi-polar   Hyperlipidemia Father    Heart disease Father    Sudden death Father    Bipolar disorder Father    Sleep apnea Father    Obesity Father    Depression Son    Colon cancer Neg Hx    Colon polyps Neg Hx    Esophageal cancer Neg Hx    Rectal cancer Neg Hx    Stomach cancer Neg Hx     Social History   Socioeconomic History   Marital status: Married    Spouse name: Not on file   Number of children: Not on file   Years of education: Not on file   Highest education level: Not on file  Occupational History   Occupation: retired pensions consultant  Tobacco Use   Smoking status: Never   Smokeless tobacco: Never  Substance and Sexual Activity   Alcohol use: Yes    Comment: occasionally    Drug use: No   Sexual activity: Not on file  Other Topics Concern   Not on file  Social History Narrative   Not on file   Social Drivers of Health   Tobacco Use: Low Risk (02/04/2024)   Patient History    Smoking Tobacco Use: Never    Smokeless Tobacco Use: Never    Passive Exposure: Not on file  Financial Resource Strain: Not on file  Food Insecurity: No Food Insecurity (01/25/2022)   Received from [facility] visits prior to 03/24/[facility]22[facility]   Epic    Within the past 12 months, you worried that your food would run out before you got the money to buy more.: Never true    Within the past 12 months, the food you bought just didn't last and you didn't have money to get more.: Never true  Transportation Needs: No Transportation Needs (01/25/2022)   Received from [employer], [name] visits prior to 03/24/2022.   PRAPARE - Administrator, Civil Service (Medical): No    Lack of Transportation (Non-Medical): No   Physical Activity: Not on file  Stress: Not on file  Social Connections: Not on file  Intimate Partner Violence: Not on file  Depression (EYV7-0): Not on file  Alcohol Screen: Not on file  Housing: Unknown (06/30/2023)   Received from [facility]   Epic    Unable to Pay for Housing in the Last Year: Not on file    Number of Times Moved in the Last Year: Not on file    At any time in the past 12 months, were you homeless or living in a shelter (including now)?: No  Utilities: Not At Risk (01/25/2022)   [facility] visits prior to 03/24/2022.   [facility] Utilities    Threatened with loss of utilities: No  Health Literacy: Not on file    Past Medical History, Surgical history, Social history, and Family history were reviewed and updated as appropriate.   Please see review of systems for further details on the patient's review from today.   Objective:   Physical Exam:  There were no vitals taken for this visit.  Physical Exam Constitutional:      General: He is not in acute distress.    Appearance: He is obese.  Musculoskeletal:        General: No deformity.  Neurological:     Mental Status: He is alert and oriented to person, place, and time.     Cranial Nerves: No dysarthria.     Motor: Weakness present.     Coordination: Coordination abnormal.     Comments: Shortened gait is better.  No sig tremor.  less Slow but still not normal.  Psychiatric:        Attention and Perception: Attention and perception normal. He does not perceive auditory or visual hallucinations.        Mood and Affect: Mood is not anxious or depressed. Affect is not labile or blunt.        Speech: Speech normal.        Behavior: Behavior is not slowed. Behavior is cooperative.        Thought Content: Thought content normal. Thought content is not paranoid or delusional. Thought content does not include homicidal or suicidal ideation. Thought content does not  include suicidal plan.        Cognition and Memory: Cognition and memory normal.        Judgment: Judgment normal.     Comments: Insight intact Depression manageable. OCD is about the same and not the main problem More expressive and more normal affect.  Quicker responses.     November 06, 2018: [place] Cog test in office within normal limits MMSE 28/30. Animal fluency 17 . (borderline) Taken as a whole, no indication to pursue neuropsychological testing.  Mini-Mental status exam 28/30 on 10/27/20.  No evidence of dementia.  Lab Review:   Vitamin D  level acceptable at 54.5.   Normal B12 and folate and TSH in last couple of years..  Echocardiogram is stable re: AVR over the last 8 years and not likely the cause of lethargy.  06/28/23 MRI head:  IMPRESSION: 1. No acute intracranial abnormality. 2. 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 Head CT may be helpful for further assessment of the degree of mineralization. 3. Findings of chronic small vessel ischemia.  SABRAres Assessment: Plan:    Eric [name] was seen today for follow-up, depression, anxiety, add, fatigue and sleeping problem.  Diagnoses and all orders for this visit:  Recurrent major depression resistant to treatment -     methylphenidate  (CONCERTA ) 36 MG PO CR tablet; Take 1 tablet (36 mg total) by mouth every morning.  Attention deficit hyperactivity disorder (ADHD), predominantly inattentive type -     methylphenidate  (CONCERTA ) 36 MG PO CR tablet; Take 1 tablet (36 mg total) by mouth every morning.  Mixed obsessional thoughts and acts  Hypersomnia with sleep apnea  Mild cognitive impairment  Restless legs syndrome  Erectile disorder, acquired, generalized, moderate      Eric [name] has a long history of depression and OCD.  Major depression with fatigue, cognitive and physical  slowness, anhedonia is much worse.  Started Spravato  and improving.  Was better energy with  duloxetine  90 vs Lexapro  so increased to 120 mg daily DT worsening depression.    But it was not better. So reduced it back to 90 mg daily.    Now reduced to 60 mg daily to see if ED better and also bc recent U retention.  (Not likely related) now dep bettter with Spravato. Disc risk worsening dep and anxiety.   He has some compulsive checking and obsessions around the house maintenance.  When travels then tends to have less OCD bc triggered less.    Received 84 mg Spravato today. The patient experienced the typical dissociation which gradually resolved over the 2-hour period of observation.  There were no complications.  Specifically the patient did not have nausea or vomiting or headache.  Blood pressures remained within normal ranges at the 40-minute and 2-hour follow-up intervals.  By the time the 2-hour observation period was met the patient was alert and oriented and able to exit without assistance.  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  See nursing note for further details. Emphasized need to relax with Spravato and not return calls, read, return chats, emails etc. Started Spravato 06/25/23. continue weekly @ 84 mg Strongly rec avoid making any decisions nor dealing with any verbal information during the Spravato procedure.  He is not compliant so far with this.  Disc in detail with him and his wife.  His OCD is chronic with checking lites, stove, etc. .  It is better at the moment DT depression being worse.  Disc CBT techniques and potential for more therapy to address. Option Dr. Marijean.  Ok increase again back to Concerta  36  mg AM  He wants to use it for depression and cognitive concerns.  Discussed potential benefits, risks, and side effects of stimulants with patient to include increased heart  rate, palpitations, insomnia, increased anxiety, increased irritability, or decreased appetite.  Instructed patient to contact office if experiencing any significant tolerability issues.  Extensive discussion of sleep study and he has a copy.  Why is there virtually no N3 & REM sleep?  Is that affecting daytime alertness and fatigue?  Vs how much is related to mild OSA and PLMS?  Disc this in detail.  Wrote correspondence with sleep doc about it.  Options for sleep & fatigue:  Difficult to assess  bc has been [place] and then sick for 3 weeks.   Focus on reduction in OSA Focus on improving deep stage sleep.  ? Rx low dose mirtazapine or alternatives Focus on leg movements (hx RLS/PLMS) continue Trial as had been suggested Lyrica  for FM type sx and may help RLS/PLMS.  Better dreaming on 100 mg HS and too sleepy on 150.  Decreased  to 100 mg HS for sleep and back pain and RLS No ropinirole  needed.   Disc SE.  Not having any.   Use LED Xanax  and try to avoid BZ daytime bc fatigue.  He doesn't use it much daythime.  Using 0..25-0.5 mg at night.  May need less with more Lyrica  at [facility] and try to minimize.  No hangover. We discussed the short-term risks associated with benzodiazepines including sedation and increased fall risk among others.  Discussed long-term side effect risk including dependence, potential withdrawal symptoms, and the potential eventual dose-related risk of dementia.  But recent studies from 2020 dispute this association between benzodiazepines and dementia risk. Newer studies in 2020 do not support an  association with dementia.  Stimulant partially successfully used off label to augment antidepressants for depression and have resulted in improved productivity and attention.    Previous screening of memory was not suggestive of any neuro degenerative process: Mini-Mental status exam 28/30 on 10/27/20.   Recent cognition worse as dep worsened.  Also gait is short and slow.    neuro appt.  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  Disc Dr. Collie note and question of whether mobility px related to hx Vraylar .  No AIM noted but reports at times with have invol tongue protrusion.  Not uncomfortable.  No movements abnormal noted today  Plan:  duloxetine  60 mg daily to reduce ED  Lyrica  100 mg HS.   Increase MPH ER to 36 mg AM,    Spravato disc in detail.  He wants to continue.  He and wife notice he's better.  Administer weekly  He's still improving.   He is tolerating it better  Follow-up 1 week.  Disc option soon to try every other week with understanding he could lose benefit  Lorene Macintosh MD, DFAPA.  Please see After Visit Summary for patient specific instructions.  Future Appointments  Date Time Provider Department Center  03/25/2024  9:45 AM Tat, Asberry RAMAN, DO [facility] None  04/02/2024  8:00 AM Gilgannon, Chloe N, PT [facility]     No orders of the defined types were placed in this encounter.      -------------------------------

## 2024-02-04 NOTE — Progress Notes (Signed)
 NURSES NOTE:         Pt arrived for his #39 Spravato Treatment for treatment resistant depression, the starting dose was 56 mg (2 of the 28 mg nasal sprays). Pt is getting 84 mg again today after receiving 3 of the 56 mg the past 3 weeks due to sedation.  Carr is a patient of Dr. Calhoun so he will follow his care throughout treatments and follow ups. Pt's Spravato is a medical authorization through buy and bill.  Spravato medication is stored at treatment center per REMS/FDA guidelines. The medication is required to be locked behind two doors per REMS/FDA protocol. Medication is also disposed of properly after each use per regulations. All documentation for REMS is completed and submitted per FDA/REMS requirements.          Began taking patient's vital signs at 9:13 AM 112/65, pulse 67, SpO2 95%. Instructed patient to blow his nose if needed then recline back to a 45 degree angle. Gave patient first dose 28 mg nasal spray, administered in each nostril as directed and observed by nurse, waited 5 more minutes for the second and third dose.  After all doses given pt did not complain of any nausea/vomiting. Pt was given water, snacks, and candy. Assessed his 40 minute vitals, 10:06 AM, 128/71, pulse 66, SpO2 95%. Explained he would be monitored for a total time of 120 minutes. Discharge vitals were taken at 11:06 AM 133/74, P 68, SpO2 97%. Dr. Geoffry met with pt. to discuss his treatment once his thoughts were clearer. Recommend he go home and sleep or just relax on the couch. No driving, no intense activities. Verbalized understanding. Nurse was with pt a total of 60 minutes for clinical assessment. Pt's wife picked him up after treatment today. Instructed to call with any issues. Pt will return next Tuesday.   (84 mg) LOT [id number] EXP AUG 2028

## 2024-02-11 ENCOUNTER — Ambulatory Visit

## 2024-02-11 ENCOUNTER — Ambulatory Visit: Admitting: Psychiatry

## 2024-02-11 VITALS — BP 129/87 | HR 68

## 2024-02-11 DIAGNOSIS — G2581 Restless legs syndrome: Secondary | ICD-10-CM

## 2024-02-11 DIAGNOSIS — G8929 Other chronic pain: Secondary | ICD-10-CM

## 2024-02-11 DIAGNOSIS — F339 Major depressive disorder, recurrent, unspecified: Secondary | ICD-10-CM | POA: Diagnosis not present

## 2024-02-11 DIAGNOSIS — F5221 Male erectile disorder: Secondary | ICD-10-CM

## 2024-02-11 DIAGNOSIS — F422 Mixed obsessional thoughts and acts: Secondary | ICD-10-CM

## 2024-02-11 DIAGNOSIS — G3184 Mild cognitive impairment, so stated: Secondary | ICD-10-CM

## 2024-02-11 DIAGNOSIS — F900 Attention-deficit hyperactivity disorder, predominantly inattentive type: Secondary | ICD-10-CM

## 2024-02-11 DIAGNOSIS — G4710 Hypersomnia, unspecified: Secondary | ICD-10-CM

## 2024-02-11 NOTE — Progress Notes (Unsigned)
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 deal with a trigger lately.  OCD driving me crazy wanting to make things perfect.   Other than the trigger had a nice time since here.  GS here for 5 days at 74 yo.  Enjoyed that.   Taking semaglutide   down to 250# from 283#. Card at [facility] says he does not have Aortic aneurysm.  Measuring is OK.  Will FU with MRI in June.   Some diarrhea lately. Cannot stop worrying.  Triggered by the plumbing issue. Meds as above.  No SE of sig.   Plan:  switched to duloxetine  to 90 mg daily.  Off Lexapro .  Others as noted. Lyrica  100 mg HS. Has held Ritalin  10 TID, can resume as long as SBP below 140 per card.  none needed ropinirole  3 mg pm.  No risperidone .  02/18/23 appt noted:  with [name] Meds: as above. Taking Ritalin  30% of night.  Prn Xanax  rarely if gets off sleep cycle. No SE.   Terribly dep but not as bad as in the past. Taking Ritalin  appears to help significantly and started doing that.  Tend to stay in bed till noon but pattern of up late.   OCD about the same with some avoidance of detail work.  Afraid I'll throw away something I need.   She doesn't know whether Ritalin  helps No sig RLS and  wife agrees. Thinks of deceased B at the holidays but worries over son Deward too.   Plan: Increase duloxetine  to 120 mg daily.  Off Lexapro .  Others as noted. Lyrica  100 mg HS.  resumed Ritalin  30 AM with some benefit. no risperidone .  03/19/23 appt noted: Psych med:  duloxetine  120, Ritalin  20 am  missing doses, Lyrica   100 HS, no risperidone , no ropinirole .   No improvement in depression since increase dose duloxetine .  More dep than usual.  Worrying about everything.  Including taxes.  All I can see is a black hole.  Making him feel negative about everything.   Keeping him from doing things.   OCD is not much different from when on Lexapro .  Wife agrees dep and distracted. Plan: resumed Ritalin  30 AM with some benefit. Resume Abilify  2 mg AM for dep.  04/16/23 appt noted:  wife here Psych med:  duloxetine  120, resumed Concerta  36 mg AM, Lyrica  100 HS, Abilify  2, no ropinirole .   Wife noted he seemed manic yesterday.  Invited window estimate against wife's will and without her input.   No mania noted until yesterday. He didn't feel manic yesterday.   He's noticed no change with Abilify  and still feels flat and a little low.  From MPH energy and mood a little better.  Sleeps until 10-11 am about 12 hours. And asleep that whole time. Wife says he doesn't eat much bc he's not awake much. Trouble with hygiene.   Plan: Meds: continue duloxetine  to 120 mg daily.  Off Lexapro .  Lyrica  100 mg HS.  resumed Ritalin  30 AM with some benefit. DC Abiilify Vraylar  1.5 mg every other day Spravato disc in detail.  He wants to pursue if not better with Vraylar .  06/18/23 appt noted: with W Med:  duloxetine  120, resumed Concerta  36 mg AM, Lyrica  100 HS, no ropinirole .   Vraylar  1.5 every other day, no benefit Spravato denied by insurance but is being appealed. More trouble walking and shorter gait.  Neuro in [place] appt in Sept.   Looking to get in in [place].  Can't be much more dep than I am.   OCD  residual when has to make a decision.   ED is more of a px. Reduced voice family. Plan : Spravato  06/25/23 appt noted: with W Med:  duloxetine  120, Concerta  36 mg AM, Lyrica  100 HS, no ropinirole .   Vraylar  1.5 daily No SE Received Spravato 56 mg today.  Experienced very mild dissociation and no sig HA, N.  But some dizziness.  Resolved by end of 2 hours observation.  Able to leave office without assistance.  Ongoing dep without change.  Slow, reduced cognition, anhedonia, forgetful, low motivation. No other concerns with meds.   06/27/23 appt noted:  Med: Med:  duloxetine  120, Concerta  36 mg AM, Lyrica  100 HS, no ropinirole .   Stopped Vraylar  1.5 daily No SE Received Spravato 84 mg todayfor the first time..  Experienced very mild dissociation and no sig HA, N.  But some dizziness.  Resolved by end of 2 hours observation.  Able to leave office without assistance.  Ongoing dep without change.  Slow, reduced cognition, anhedonia, forgetful, low motivation. No other concerns with meds.   07/02/23 appt oted: Med: Med:  duloxetine  90, Concerta  36 mg AM, Lyrica  100 HS, no ropinirole .   Stopped Vraylar  1.5 daily No SE Received Spravato 84 mg today.  Experienced very mild dissociation and no sig HA, N.  But some dizziness.  Resolved by end of 2 hours observation.  Mildly drowsy at end of session. BC of gait px he needed to use WC to leave the office.  Per wife gait gradually worsened over a couple of years but accelerated recently in 3 mos or so.  Short gait.  Not due to pain.  Pending neuro appt.  MRI last week  not read yet. No problems with meds. Wife noticed he's shown more initiative at home since [place] ex doing crossword puzzles.  More engaged.  He feels lighter already with it.  07/15/23 appt noted:  Med: Med:  duloxetine  90, Concerta  36 mg AM, Lyrica  100 HS, no ropinirole .  No SE Received Spravato 84 mg today.  Experienced very mild dissociation and no sig HA, N.  But some dizziness.   Resolved by end of 2 hours observation.  Mildly drowsy at end of session. BC of gait px he needed to use WC to leave the office. Seen with W.   W noted clear benefit Spravato with stronger voice, spontaneous chores he wasn't doing.  More engaged in coverstation.  Pt agrees. Disc concerns over gait px and his neuro visit and MRI scan suggesting Fahr's DZ.  He'll discuss further with DR. Tat tomorrow.  07/18/23 appt noted:  Med: Med:  duloxetine  90, Concerta  36 mg AM, Lyrica  100 HS, no ropinirole .  No SE Received Spravato 84 mg today.  Experienced very mild dissociation and no sig HA, N.  But some dizziness.  Resolved by end of 2 hours observation.  Mildly drowsy at end of session. BC of gait px he needed to use WC to leave the office. No med concerns.  Seen with W.    Both agree dep is better .  More affective range.  More socially engaged.  Quicker thought.  More active .  Less down and less negative.  07/23/23 appt noted: Med: Med:  duloxetine  90, Concerta  36 mg AM, Lyrica  100 HS, no ropinirole .  No SE Received Spravato 84 mg today.  Experienced very mild dissociation and no sig HA, N.  But some dizziness.  Resolved by end of 2 hours observation.  Mildly drowsy at end of session. BC of gait px he needed to use WC to leave the office. No med concerns.  Seen with W.    Dep is still improved.  But is dealing with poor gait, weak and slow.  Will start neurorehab today.   Anxiety re: OCD manageable. Thought and affect quicker and more responsive .  Humor returned.  07/25/23 appt:  Med: Med:  duloxetine  90, Concerta  36 mg AM, Lyrica  100 HS, no ropinirole .  No SE Received Spravato 84 mg today.  Experienced very mild dissociation and no sig HA, N.  But some dizziness.  Resolved by end of 2 hours observation.  Mildly drowsy at end of session. BC of gait px he needed to use WC to leave the office. No med concerns.  Seen with W.    Overall mood still improving but not 100%.  Starting neurorehab for  weakness.  No new med concerns.  Satisfied with meds.  07/30/23 appt noted:  Med: Med:  duloxetine  90, Concerta  36 mg AM, Lyrica  100 HS, no ropinirole .  No SE Received Spravato 84 mg today.  Experienced very mild dissociation and no sig HA, N.  But some dizziness.  Resolved by end of 2 hours observation.  Mildly drowsy at end of session. BC of gait px he needed to use WC to leave the office. No med concerns.  Seen with W.    Both agree mood is improving and activity and interest.  Not 100% but limited by mobility and starting PT.    08/15/23 appt noted:  Med:  duloxetine  90, Concerta  36 mg AM, Lyrica  100 HS, no ropinirole .  No SE Received Spravato 56 mg today.  Experienced very mild  dissociation and no sig HA, N.  But some dizziness.  Resolved by end of 2 hours observation.  Mildly drowsy at end of session.  But less than when on 84 mg. BC of gait px he needed to use WC to leave the office. No med concerns.  Seen with W.    Less drowsy at  end of time here than when got 84 mg daily. They both agree good response with dep and anxiety.  More involved and better interaction socially.  More initiative.  08/20/23 appt noted:  Med:  duloxetine  90, Concerta  36 mg AM, Lyrica  100 HS, no ropinirole .  No SE Received Spravato 56 mg today.  Experienced very mild dissociation and no sig HA, N.  But some dizziness.  Resolved by end of 2 hours observation.  Mildly drowsy at end of session.  But less than when on 84 mg. BC of gait px he needed to use WC to leave the office. No med concerns.  Seen with W.   She's still concerned he sleeps too much.  He's not sure why he does so.  Overall mood, initiative, socialization is better. Less drowsy at  end of time here than when got 84 mg daily.  08/27/23 appt noted:  Med:  duloxetine  90, Concerta  36 mg AM, Lyrica  100 HS, no ropinirole .  No SE Received Spravato 84 mg today.  Experienced very mild dissociation and no sig HA, N.  But some dizziness.  Resolved by end  of 2 hours observation.  Mildly drowsy at end of session.  Walking is some better with PT.  Wife agrees.  He's more interested, responsive, engaged.  Better cognition. After Spravato 56 he does not have to nap when goes home. After 84 mg needed to nap but he wants to increase back to 84 mg to get max effect. More sedate after 84 mg today but is able to clear by end of session.  But wants to continue it.  Over all doing pretty well  with less dep and more engagement and activity including spontaneously cleaning things.   09/10/23 appt noted:  Med:  duloxetine  90, Concerta  36 mg AM, Lyrica  100 HS, no ropinirole .  No SE Received Spravato 84 mg today.  Experienced very mild dissociation and no sig HA, N.  But some dizziness.  Resolved by end of 2 hours observation.  Mildly drowsy at end of session.  Mood continues to improve overall and anxiety better too.  Walking is improving.  Cognition and emotional engagment, activity are all better. Plan no med changes  09/24/23 appt noted: Med: Med:  duloxetine  90, Concerta  36 mg AM, Lyrica  100 HS, no ropinirole .  No SE Received Spravato 84 mg today.  Experienced very mild dissociation and no sig HA, N.  But some dizziness.  Resolved by end of 2 hours observation.  Mildly drowsy at end of session.  Mood continues to improve overall and anxiety better too.  Walking is improving.  Cognition and emotional engagment, activity are all better. Asks about reducing duloxetine  bc ED issues. Plan: reduce duloxetine  to 60 mg daily to reduce ED  10/08/23 appt Med: Med:  duloxetine  60, Concerta  36 mg AM, Lyrica  100 HS, no ropinirole .  Received Spravato 84 mg today.  Experienced very mild dissociation and no sig HA, N.  But some dizziness.  Resolved by end of 2 hours observation.  Mildly drowsy at end of session.  Some dysphoria this week but he and wife continue to see improvement and  benefit with meds and Spravato.   SE concerta  feeling jittery and skips it at times.    10/15/23 appt noted:  Med :  duloxetine  60, Concerta  36 mg AM, Lyrica  100 HS, no ropinirole .  Received Spravato 84 mg today.  Experienced very mild dissociation and no sig HA, N.  But some dizziness.  Resolved by end of 2 hours observation.  Mildly drowsy at end of session.  he and wife continue to see improvement and benefit with meds and Spravato.   Minimal depression now. Stressed by nearly scammed this week by caller, but wife stopped. It.  She is concerned bc in the past he would not have been so easily deceived.  However he says they were just good and he ultimately didn't get scammed.  No concerns with current meds. Plan reduce Concerta  to 27 mg AM  10/22/23 appt noted:  Med :  duloxetine  60, Concerta  36 mg AM, Lyrica  100 HS, no ropinirole .  Received Spravato 84 mg today.  Experienced very mild dissociation and no sig HA, N.  But some dizziness.  Resolved by end of 2 hours observation.  Mildly drowsy at end of session.  he and wife continue to see improvement and benefit with meds and Spravato.   Minimal depression now. Hasn't reduced Concerta  yet.   Is wearing a bladder cath bc U retention.   Suspected prostate px in workup.  Mood is still improving.  Will be here next week then going to see GS in [place] for a week. Plan: as noted reduce Concerta  2 AM  10/29/23 appt noted;  Med :  duloxetine  60, Concerta  36 mg AM, Lyrica  100 HS, no ropinirole .  Received Spravato 84 mg today.   Experienced very mild dissociation and no sig HA, N.  But some dizziness.  Resolved by end of 2 hours observation.  Mildly drowsy at end of session.  he and wife continue to see improvement and benefit with meds and Spravato.   Minimal depression now. Hasn't reduced Concerta  yet.   Is wearing a bladder cath bc U retention.   Frustrated over this but gets it out tomorrow. Pretty good with mood. No concerns with meds.  Intermittent compliance with Concerta .   Wife concerns over poor judgment at times.  Like when  makes financial decisions while receiving Spravato.  10/31/23 appt noted:  Med :  duloxetine  60, Concerta  36 mg AM, Lyrica  100 HS, no ropinirole .  Received Spravato 84 mg today.   Experienced very mild dissociation and no sig HA, N.  But some dizziness.  Resolved by end of 2 hours observation.  Mildly drowsy at end of session.  he and wife continue to see improvement and benefit with meds and Spravato.   Minimal depression now. Hasn't reduced Concerta  yet.   Is wearing a bladder cath bc U retention.   Frustrated over this. Mood is better markedly with Spravato.  Not as much OCD lately but distracted by health problems.    11/11/23 appt noted:  Med :  duloxetine  60, Concerta  36 mg AM, Lyrica  100 HS, no ropinirole .  Received Spravato 84 mg today.   Experienced very mild dissociation and no sig HA, N.  But some dizziness.  Resolved by end of 2 hours observation.  Mildly drowsy at end of session.  he and wife continue to see improvement and benefit with meds and Spravato.   Minimal depression now. Trip to [place] to visit family went well sitting with GS.  Missed a Spravato admin.  Dep remained manageable generally.  Wife indicated he was negative this am but he feels better after Spravato.  Disc concerns abotu gait gradually better.  OCD is better.   Plan reduce Concerta  to 27 mg am  11/26/23 appt noted: Med :  duloxetine  60, Concerta  27 mg AM, Lyrica  100 HS, no ropinirole .  Received Spravato 84 mg today.   Experienced very mild dissociation and no sig HA, N.  But some dizziness.  Resolved by end of 2 hours observation.  Mildly drowsy at end of session.  he and wife continue to see improvement and benefit with meds and Spravato.   Minimal depression now. Done fine with less Concerta .  Mood is better and function better and wife agrees.  Satisfied with meds.   12/17/23 appt noted:  Med :  duloxetine  60, Concerta  27 mg AM, Lyrica  100 HS, no ropinirole .  Received Spravato 84 mg today.   No  SE Experienced very mild dissociation and no sig HA, N.  But some dizziness.  Resolved by end of 2 hours observation.  Mildly drowsy at end of session.  he and wife continue to see improvement and benefit with meds and Spravato.   Tendency to do tasks under influence of Spravato against recommendation of staff and his wife's desires.  Minimal depression now.  OCD is still present but less anxiety from it now.  Doing well with meds without concerns.  Strength is not normal but has improved over time.  12/31/23 appt noted:  Med :  duloxetine  60, Concerta  27 mg AM, Lyrica  100 HS,.  Received Spravato 84 mg today.   No SE Experienced very mild dissociation and no sig HA, N.  But some dizziness.  Resolved by end of 2 hours observation.  Mildly drowsy at end of session.  Persistent weakness requiring WC when he leaves. he and wife continue to see improvement and benefit with meds and Spravato.  Both agree he is not sig dep today nor recently.  Is still tired physically and still hard to walk.  OCD and anxiety are pretty well controlled to with current tx plan. No med changes wanted.  01/14/24 appt noted:  Med :  duloxetine  60, Concerta  27 mg AM, Lyrica  100 HS,.  Received Spravato 84 mg today.   No SE Experienced very mild dissociation and no sig HA, N.  But some dizziness.  Resolved by end of 2 hours observation.  Mildly drowsy at end of session.  Persistent weakness requiring WC when he leaves. Still sees consistent benefit with Spravato and function better.  No problems with meds and no med changes wanted.    02/04/24 appt noted:  Med: duloxetine  60, Concerta  27 mg AM, Lyrica  100 HS,.  Received Spravato 84 mg today.   No SE Experienced very mild dissociation and no sig HA, N.  But some dizziness.  Resolved by end of 2 hours observation.  Mildly drowsy at end of session.  Persistent weakness requiring WC when he leaves. Still sees consistent benefit with Spravato and function better.  Mood  gradually better. Asking to increase Concerta  for better energy and focus and productivity.  Has had his BP checked and reassured by PCP he should be able to tolerate higher Concerta .   Plan: Increase MPH ER to 36 mg AM,   02/11/24 APPTNOTE: Med: duloxetine  60, Increase MPH ER to 36 mg AM,  Lyrica  100 HS,.  Received Spravato 84 mg today.   No SE Experienced very mild dissociation and no sig  HA, N.  But some dizziness.  Resolved by end of 2 hours observation.  Mildly drowsy at end of session.  Persistent weakness requiring WC when he leaves.   ECT-MADRS    Flowsheet Row Office Visit from 06/18/2023 in [facility] Office Visit from 04/16/2023 in [facility]  MADRS Total Score 37 36   PHQ2-9    Flowsheet Row Office Visit from 03/15/2020 in [facility] Total Score 3  PHQ-9 Total Score 9     B schizophrenic SUI. After M's death. PCP Cecil Ee at [facility] at 74 years old.  Prior psychiatric medication trials include  Lexapro  20, citalopram NR, clomipramine weight gain, paroxetine, fluoxetine, Luvox, Trintellix,   Increase Lexapro  back to 20 mg January 2020. & 10/2020 bupropion ,  Auvelity NR  Abilify  10 fatigue,  Cerefolin NAC, and   Naltrexone  sexual SE Lyrica  150 tired  pramipexole,  ropinirole  Adderall XR & IR sexual SE,  Ritalin  30, Concerta  72 mg AM NR modafinil  and Nuvigil,   History [employer] OCD  Review of Systems:  Review of Systems  Constitutional:  Positive for fatigue. Negative for fever.  Cardiovascular:  Positive for palpitations. Negative for chest pain.  Genitourinary:        ED  Musculoskeletal:  Positive for arthralgias, gait problem and myalgias.  Neurological:  Positive for weakness. Negative for dizziness, tremors and syncope.  Psychiatric/Behavioral:  Negative for agitation, behavioral problems, confusion, decreased  concentration, dysphoric mood, hallucinations, self-injury, sleep disturbance and suicidal ideas. The patient is nervous/anxious. The patient is not hyperactive.     Medications: I have reviewed the patient's current medications.  Current Outpatient Medications  Medication Sig Dispense Refill   ALPRAZolam  (XANAX ) 0.25 MG tablet Take 1 tablet (0.25 mg total) by mouth 2 (two) times daily as needed for anxiety or sleep. 30 tablet 1   aspirin 81 MG tablet Take 81 mg by mouth daily.     Cholecalciferol (VITAMIN D -3) 125 MCG (5000 UT) TABS Take 1 tablet (5,000 Units total) by mouth daily. 90 tablet 1   Cholecalciferol 50 MCG (2000 UT) CAPS 1 tablet Orally Once a day     DULoxetine  (CYMBALTA ) 30 MG capsule Take 60 mg by mouth daily.     Esketamine HCl, 84 MG Dose, (SPRAVATO, 84 MG DOSE,) 28 MG/DEVICE SOPK 3 sprays in each nostril Nasally Twice a week Starts this dose on 06/27/2023     methylphenidate  (CONCERTA ) 36 MG PO CR tablet Take 1 tablet (36 mg total) by mouth every morning. 30 tablet 0   naltrexone  (DEPADE) 50 MG tablet Take 0.5 tablets (25 mg total) by mouth daily. 45 tablet 1   pregabalin  (LYRICA ) 100 MG capsule Take 100 mg by mouth daily.     simvastatin (ZOCOR) 20 MG tablet Take 20 mg by mouth at bedtime.     ZEPBOUND  15 MG/0.5ML Pen Inject 15 mg into the skin once a week.     No current facility-administered medications for this visit.    Medication Side Effects: None sexual SE are better not  All gone.  Allergies:  Allergies  Allergen Reactions   E.E.S. [Erythromycin] Hives   Macrolides And Ketolides Other (See Comments)    EES    Rosuvastatin     Other reaction(s): cramps    Past Medical History:  Diagnosis Date   Allergy    Anemia    iron- pt denies  Anxiety    Aortic cusp regurgitation    Carotid artery occlusion    Constipation    Coronary artery stenosis    Hyperlipidemia    Lactose intolerance    Major depression, recurrent, chronic     Obesity    OCD (obsessive compulsive disorder)    OSA (obstructive sleep apnea)    Other chronic pain    Periodic limb movement disorder    Periodic limb movements of sleep    Prediabetes    Pure hypercholesterolemia    Restless legs    Sleep apnea    wears cpap    Vitamin D  deficiency     Family History  Problem Relation Age of Onset   Cancer Mother        breast and ovarian   Anxiety disorder Mother    Breast cancer Mother    Ovarian cancer Mother    Depression Father        bi-polar   Hyperlipidemia Father    Heart disease Father    Sudden death Father    Bipolar disorder Father    Sleep apnea Father    Obesity Father    Depression Son    Colon cancer Neg Hx    Colon polyps Neg Hx    Esophageal cancer Neg Hx    Rectal cancer Neg Hx    Stomach cancer Neg Hx     Social History   Socioeconomic History   Marital status: Married    Spouse name: Not on file   Number of children: Not on file   Years of education: Not on file   Highest education level: Not on file  Occupational History   Occupation: retired pensions consultant  Tobacco Use   Smoking status: Never   Smokeless tobacco: Never  Substance and Sexual Activity   Alcohol use: Yes    Comment: occasionally    Drug use: No   Sexual activity: Not on file  Other Topics Concern   Not on file  Social History Narrative   Not on file   Social Drivers of Health   Tobacco Use: Low Risk (02/04/2024)   Patient History    Smoking Tobacco Use: Never    Smokeless Tobacco Use: Never    Passive Exposure: Not on file  Financial Resource Strain: Not on file  Food Insecurity: No Food Insecurity (01/25/2022)   Received from [facility] visits prior to [facility]24/2022[facility]   Epic    Within the past 12 months, you worried that your food would run out before you got the money to buy more.: Never true    Within the past 12 months, the food you bought just  didn't last and you didn't have money to get more.: Never true  Transportation Needs: No Transportation Needs (01/25/2022)   Received from [employer], [facility] visits prior to 03/24/2022.   PRAPARE - Administrator, Civil Service (Medical): No    Lack of Transportation (Non-Medical): No  Physical Activity: Not on file  Stress: Not on file  Social Connections: Not on file  Intimate Partner Violence: Not on file  Depression (EYV7-0): Not on file  Alcohol Screen: Not on file  Housing: Unknown (06/30/2023)   Received from [facility]   Epic    Unable to Pay for Housing in the Last Year: Not on file    Number of Times Moved in the Last Year: Not on file  At any time in the past 12 months, were you homeless or living in a shelter (including now)?: No  Utilities: Not At Risk (01/25/2022)   [facility] visits prior to 03/24/2022.   [facility] Utilities    Threatened with loss of utilities: No  Health Literacy: Not on file    Past Medical History, Surgical history, Social history, and Family history were reviewed and updated as appropriate.   Please see review of systems for further details on the patient's review from today.   Objective:   Physical Exam:  There were no vitals taken for this visit.  Physical Exam Constitutional:      General: He is not in acute distress.    Appearance: He is obese.  Musculoskeletal:        General: No deformity.  Neurological:     Mental Status: He is alert and oriented to person, place, and time.     Cranial Nerves: No dysarthria.     Motor: Weakness present.     Coordination: Coordination abnormal.     Comments: Shortened gait is better.  No sig tremor.  less Slow but still not normal.  Psychiatric:        Attention and Perception: Attention and perception normal. He does not perceive auditory or visual hallucinations.        Mood and Affect: Mood is not  anxious or depressed. Affect is not labile or blunt.        Speech: Speech normal.        Behavior: Behavior is not slowed. Behavior is cooperative.        Thought Content: Thought content normal. Thought content is not paranoid or delusional. Thought content does not include homicidal or suicidal ideation. Thought content does not include suicidal plan.        Cognition and Memory: Cognition and memory normal.        Judgment: Judgment normal.     Comments: Insight intact Depression manageable. OCD is about the same and not the main problem More expressive and more normal affect.  Quicker responses.     November 06, 2018: [place] Cog test in office within normal limits MMSE 28/30. Animal fluency 17 . (borderline) Taken as a whole, no indication to pursue neuropsychological testing.  Mini-Mental status exam 28/30 on 10/27/20.  No evidence of dementia.  Lab Review:   Vitamin D  level acceptable at 54.5.   Normal B12 and folate and TSH in last couple of years..  Echocardiogram is stable re: AVR over the last 8 years and not likely the cause of lethargy.  06/28/23 MRI head:  IMPRESSION: 1. No acute intracranial abnormality. 2. 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 Head CT may be helpful for further assessment of the degree of mineralization. 3. Findings of chronic small vessel ischemia.  SABRAres Assessment: Plan:    There are no diagnoses linked to this encounter.     Mr. Cleckler has a long history of depression and OCD.  Major depression with fatigue, cognitive and physical slowness, anhedonia is much worse.  Started Spravato  and improving.  Was better energy with duloxetine  90 vs Lexapro  so increased to 120 mg daily DT worsening depression.    But it was not better. So reduced it back to 90 mg daily.    Now reduced to 60 mg daily to see if ED better and  also bc recent  U retention.  (Not likely related) now dep bettter with Spravato. Disc risk worsening dep and anxiety.   He has some compulsive checking and obsessions around the house maintenance.  When travels then tends to have less OCD bc triggered less.    Received 84 mg Spravato today. The patient experienced the typical dissociation which gradually resolved over the 2-hour period of observation.  There were no complications.  Specifically the patient did not have nausea or vomiting or headache.  Blood pressures remained within normal ranges at the 40-minute and 2-hour follow-up intervals.  By the time the 2-hour observation period was met the patient was alert and oriented and able to exit without assistance.  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  See nursing note for further details. Emphasized need to relax with Spravato and not return calls, read, return chats, emails etc. Started Spravato 06/25/23. continue weekly @ 84 mg Strongly rec avoid making any decisions nor dealing with any verbal information during the Spravato procedure.  He is not compliant so far with this.  Disc in detail with him and his wife.  His OCD is chronic with checking lites, stove, etc. .  It is better at the moment DT depression being worse.  Disc CBT techniques and potential for more therapy to address. Option Dr. Marijean.  Ok increase again back to Concerta  36  mg AM  He wants to use it for depression and cognitive concerns.  Discussed potential benefits, risks, and side effects of stimulants with patient to include increased heart rate, palpitations, insomnia, increased anxiety, increased irritability, or decreased appetite.  Instructed patient to contact office if experiencing any significant tolerability issues.  Extensive discussion of sleep study and he has a copy.  Why is there virtually no N3 & REM sleep?  Is  that affecting daytime alertness and fatigue?  Vs how much is related to mild OSA and PLMS?  Disc this in detail.  Wrote correspondence with sleep doc about it.  Options for sleep & fatigue:  Difficult to assess  bc has been [place] and then sick for 3 weeks.   Focus on reduction in OSA Focus on improving deep stage sleep.  ? Rx low dose mirtazapine or alternatives Focus on leg movements (hx RLS/PLMS) continue Trial as had been suggested Lyrica  for FM type sx and may help RLS/PLMS.  Better dreaming on 100 mg HS and too sleepy on 150.  Decreased  to 100 mg HS for sleep and back pain and RLS No ropinirole  needed.   Disc SE.  Not having any.   Use LED Xanax  and try to avoid BZ daytime bc fatigue.  He doesn't use it much daythime.  Using 0..25-0.5 mg at night.  May need less with more Lyrica  at HS and try to minimize.  No hangover. We discussed the short-term risks associated with benzodiazepines including sedation and increased fall risk among others.  Discussed long-term side effect risk including dependence, potential withdrawal symptoms, and the potential eventual dose-related risk of dementia.  But recent studies from 2020 dispute this association between benzodiazepines and dementia risk. Newer studies in 2020 do not support an association with dementia.  Stimulant partially successfully used off label to augment antidepressants for depression and have resulted in improved productivity and attention.    Previous screening of memory was not suggestive of any neuro degenerative process: Mini-Mental status exam 28/30 on 10/27/20.   Recent  cognition worse as dep worsened.  Also gait is short and slow.   neuro appt.  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  Disc Dr. Collie note  01/30/24: pending DAT scan  Plan:  duloxetine  60 mg daily to reduce ED  Lyrica  100 mg HS.   Increase MPH ER to 36 mg AM,    Spravato disc in detail.  He wants to continue.  He and wife notice he's better.   Administer weekly  He's still improving.   He is tolerating it better  Follow-up 1 week.  Disc option soon to try every other week with understanding he could lose benefit  Lorene Macintosh MD, DFAPA.  Please see After Visit Summary for patient specific instructions.  Future Appointments  Date Time Provider Department Center  02/18/2024  9:00 AM Cottle, Lorene KANDICE Raddle., MD [facility] None  02/18/2024  9:00 AM CP-NURSE [facility] None  03/25/2024  9:45 AM Tat, Asberry RAMAN, DO [facility] None  04/02/2024  8:00 AM Gilgannon, Chloe N, PT [facility]     No orders of the defined types were placed in this encounter.      -------------------------------

## 2024-02-11 NOTE — Progress Notes (Signed)
 NURSES NOTE:         Pt arrived for his #40 Spravato Treatment for treatment resistant depression, the starting dose was 56 mg (2 of the 28 mg nasal sprays). Pt is getting 84 mg again today after receiving 3 of the 56 mg the past 3 weeks due to sedation.  Eric [name] is a patient of Dr. Calhoun so he will follow his care throughout treatments and follow ups. Pt's Spravato is a medical authorization through buy and bill.  Spravato medication is stored at treatment center per REMS/FDA guidelines. The medication is required to be locked behind two doors per REMS/FDA protocol. Medication is also disposed of properly after each use per regulations. All documentation for REMS is completed and submitted per FDA/REMS requirements.          Began taking patient's vital signs at 9:08 AM 135/80, pulse 68, SpO2 95%. Instructed patient to blow his nose if needed then recline back to a 45 degree angle. Gave patient first dose 28 mg nasal spray, administered in each nostril as directed and observed by nurse, waited 5 more minutes for the second and third dose.  After all doses given pt did not complain of any nausea/vomiting. Pt was given water, snacks, and candy. Assessed his 40 minute vitals, 9:48 AM, 132/67, pulse 63, SpO2 96%. Explained he would be monitored for a total time of 120 minutes. Discharge vitals were taken at 11:44 AM 129/87, P 68, SpO2 95%. Dr. Geoffry met with pt. to discuss his treatment once his thoughts were clearer. Recommend he go home and sleep or just relax on the couch. No driving, no intense activities. Verbalized understanding. Nurse was with pt a total of 60 minutes for clinical assessment. Pt's wife picked him up after treatment today. Instructed to call with any issues. Pt will return next Tuesday.   (84 mg) LOT [id number] EXP MARCH 2028

## 2024-02-12 ENCOUNTER — Encounter: Payer: Self-pay | Admitting: Psychiatry

## 2024-02-18 ENCOUNTER — Ambulatory Visit

## 2024-02-18 ENCOUNTER — Encounter

## 2024-02-18 ENCOUNTER — Encounter: Admitting: Psychiatry

## 2024-02-18 ENCOUNTER — Ambulatory Visit: Admitting: Psychiatry

## 2024-02-18 ENCOUNTER — Encounter: Payer: Self-pay | Admitting: Psychiatry

## 2024-02-18 VITALS — BP 133/67 | HR 69

## 2024-02-18 DIAGNOSIS — G8929 Other chronic pain: Secondary | ICD-10-CM

## 2024-02-18 DIAGNOSIS — F900 Attention-deficit hyperactivity disorder, predominantly inattentive type: Secondary | ICD-10-CM

## 2024-02-18 DIAGNOSIS — F339 Major depressive disorder, recurrent, unspecified: Secondary | ICD-10-CM | POA: Diagnosis not present

## 2024-02-18 DIAGNOSIS — G3184 Mild cognitive impairment, so stated: Secondary | ICD-10-CM

## 2024-02-18 DIAGNOSIS — G4710 Hypersomnia, unspecified: Secondary | ICD-10-CM

## 2024-02-18 DIAGNOSIS — F5221 Male erectile disorder: Secondary | ICD-10-CM

## 2024-02-18 DIAGNOSIS — G2581 Restless legs syndrome: Secondary | ICD-10-CM

## 2024-02-18 DIAGNOSIS — F422 Mixed obsessional thoughts and acts: Secondary | ICD-10-CM

## 2024-02-18 NOTE — Progress Notes (Signed)
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 Worrying too much bc OCD.  Trying to decide about how to deal with a trigger lately.  OCD driving me crazy wanting to make things perfect.   Other than the trigger had a nice time since here.  GS here for 5 days at 74 yo.  Enjoyed that.   Taking semaglutide   down to 250# from 283#. Card at [facility] says he does not have Aortic aneurysm.  Measuring is OK.  Will FU with MRI in June.   Some diarrhea lately. Cannot stop worrying.  Triggered by the plumbing issue. Meds as above.  No SE of sig.   Plan:  switched to duloxetine  to 90 mg daily.  Off Lexapro .  Others as noted. Lyrica  100 mg HS. Has held Ritalin  10 TID, can resume as long as SBP below 140 per card.  none needed ropinirole  3 mg pm.  No risperidone .  02/18/23 appt noted:  with [name] Meds: as above. Taking Ritalin  30% of night.  Prn Xanax  rarely if gets off sleep cycle. No SE.   Terribly dep but not as bad as in the past. Taking Ritalin  appears to help significantly and started doing that.  Tend to stay in bed till noon but pattern of up late.   OCD about the same with some avoidance of detail work.  Afraid I'll  throw away something I need.   She doesn't know whether Ritalin  helps No sig RLS and wife agrees. Thinks of deceased B at the holidays but worries over son Deward too.   Plan: Increase duloxetine  to 120 mg daily.  Off Lexapro .  Others as noted. Lyrica  100 mg HS.  resumed Ritalin  30 AM with some benefit. no risperidone .  03/19/23 appt noted: Psych  med:  duloxetine  120, Ritalin  20 am  missing doses, Lyrica  100 HS, no risperidone , no ropinirole .   No improvement in depression since increase dose duloxetine .  More dep than usual.  Worrying about everything.  Including taxes.  All I can see is a black hole.  Making him feel negative about everything.   Keeping him from doing things.   OCD is not much different from when on Lexapro .  Wife agrees dep and distracted. Plan: resumed Ritalin  30 AM with some benefit. Resume Abilify  2 mg AM for dep.  04/16/23 appt noted:  wife here Psych med:  duloxetine  120, resumed Concerta  36 mg AM, Lyrica  100 HS, Abilify  2, no ropinirole .   Wife noted he seemed manic yesterday.  Invited window estimate against wife's will and without her input.   No mania noted until yesterday. He didn't feel manic yesterday.   He's noticed no change with Abilify  and still feels flat and a little low.  From MPH energy and mood a little better.  Sleeps until 10-11 am about 12 hours. And asleep that whole time. Wife says he doesn't eat much bc he's not awake much. Trouble with hygiene.   Plan: Meds: continue duloxetine  to 120 mg daily.  Off Lexapro .  Lyrica  100 mg HS.  resumed Ritalin  30 AM with some benefit. DC Abiilify Vraylar  1.5 mg every other day Spravato disc in detail.  He wants to pursue if not better with Vraylar .  06/18/23 appt noted: with W Med:  duloxetine  120, resumed Concerta  36 mg AM, Lyrica  100 HS, no ropinirole .   Vraylar  1.5 every other day, no benefit Spravato denied by insurance but is being appealed. More trouble walking and shorter gait.  Neuro in [place] appt  in Sept.   Looking to get in in [place].  Can't be much more dep than I am.   OCD residual when has to make a decision.   ED is more of a px. Reduced voice family. Plan : Spravato  06/25/23 appt noted: with W Med:  duloxetine  120, Concerta  36 mg AM, Lyrica  100 HS, no ropinirole .   Vraylar  1.5 daily No SE Received Spravato 56 mg today.  Experienced very mild dissociation and no sig HA, N.  But some dizziness.  Resolved by end of 2 hours observation.  Able to leave office without assistance.  Ongoing dep without change.  Slow, reduced cognition, anhedonia, forgetful, low motivation. No other concerns with meds.   06/27/23 appt noted:  Med: Med:  duloxetine  120, Concerta  36 mg AM, Lyrica  100 HS, no ropinirole .   Stopped Vraylar  1.5 daily No SE Received Spravato 84 mg todayfor the first time..  Experienced very mild dissociation and no sig HA, N.  But some dizziness.  Resolved by end of 2 hours observation.  Able to leave office without assistance.  Ongoing dep without change.  Slow, reduced cognition, anhedonia, forgetful, low motivation. No other concerns with meds.   07/02/23 appt oted: Med: Med:  duloxetine  90, Concerta  36 mg AM, Lyrica  100 HS, no ropinirole .   Stopped Vraylar  1.5 daily No SE Received Spravato 84 mg today.  Experienced very mild dissociation and no sig HA, N.  But some dizziness.  Resolved by end of 2 hours observation.  Mildly drowsy at end of session. BC of gait px he needed to use WC to leave the office.  Per wife gait gradually worsened over a couple of years but accelerated recently in 3 mos or so.  Short gait.  Not  due to pain.  Pending neuro appt.  MRI last week not read yet. No problems with meds. Wife noticed he's shown more initiative at home since [place] ex doing crossword puzzles.  More engaged.  He feels lighter already with it.  07/15/23 appt noted:  Med: Med:  duloxetine  90, Concerta  36 mg AM, Lyrica  100 HS, no ropinirole .  No SE Received Spravato 84 mg  today.  Experienced very mild dissociation and no sig HA, N.  But some dizziness.  Resolved by end of 2 hours observation.  Mildly drowsy at end of session. BC of gait px he needed to use WC to leave the office. Seen with W.   W noted clear benefit Spravato with stronger voice, spontaneous chores he wasn't doing.  More engaged in coverstation.  Pt agrees. Disc concerns over gait px and his neuro visit and MRI scan suggesting Fahr's DZ.  He'll discuss further with DR. Tat tomorrow.  07/18/23 appt noted:  Med: Med:  duloxetine  90, Concerta  36 mg AM, Lyrica  100 HS, no ropinirole .  No SE Received Spravato 84 mg today.  Experienced very mild dissociation and no sig HA, N.  But some dizziness.  Resolved by end of 2 hours observation.  Mildly drowsy at end of session. BC of gait px he needed to use WC to leave the office. No med concerns.  Seen with W.    Both agree dep is better .  More affective range.  More socially engaged.  Quicker thought.  More active .  Less down and less negative.  07/23/23 appt noted: Med: Med:  duloxetine  90, Concerta  36 mg AM, Lyrica  100 HS, no ropinirole .  No SE Received Spravato 84 mg today.  Experienced very mild dissociation and no sig HA, N.  But some dizziness.  Resolved by end of 2 hours observation.  Mildly drowsy at end of session. BC of gait px he needed to use WC to leave the office. No med concerns.  Seen with W.    Dep is still improved.  But is dealing with poor gait, weak and slow.  Will start neurorehab today.   Anxiety re: OCD manageable. Thought and affect quicker and more responsive .  Humor returned.  07/25/23 appt:  Med: Med:  duloxetine  90, Concerta  36 mg AM, Lyrica  100 HS, no ropinirole .  No SE Received Spravato 84 mg today.  Experienced very mild dissociation and no sig HA, N.  But some dizziness.  Resolved by end of 2 hours observation.  Mildly drowsy at end of session. BC of gait px he needed to use WC to leave the office. No med concerns.   Seen with W.    Overall mood still improving but not 100%.  Starting neurorehab for weakness.  No new med concerns.  Satisfied with meds.  07/30/23 appt noted:  Med: Med:  duloxetine  90, Concerta  36 mg AM, Lyrica  100 HS, no ropinirole .  No SE Received Spravato 84 mg today.  Experienced very mild dissociation and no sig HA, N.  But some dizziness.  Resolved by end of 2 hours observation.  Mildly drowsy at end of session. BC of gait px he needed to use WC to leave the office. No med concerns.  Seen with W.    Both agree mood is improving and activity and interest.  Not 100% but limited by mobility and starting PT.    08/15/23 appt noted:  Med:  duloxetine  90, Concerta  36 mg AM, Lyrica  100 HS, no ropinirole .  No SE Received Spravato 56 mg today.  Experienced very mild dissociation and no sig HA, N.  But some dizziness.  Resolved by end of 2 hours observation.  Mildly drowsy at end of session.  But less than when on 84 mg. BC of gait px he needed to use WC to leave the office. No med concerns.  Seen with W.    Less drowsy at  end of time here than when got 84 mg daily. They both agree good response with dep and anxiety.  More involved and better interaction socially.  More initiative.  08/20/23 appt noted:  Med:  duloxetine  90, Concerta  36 mg AM, Lyrica  100 HS, no ropinirole .  No SE Received Spravato 56 mg today.  Experienced very mild dissociation and no sig HA, N.  But some dizziness.  Resolved by end of 2 hours observation.  Mildly drowsy at end of session.  But less than when on 84 mg. BC of gait px he needed to use WC to leave the office. No med concerns.  Seen with W.   She's still concerned he sleeps too much.  He's not sure why he does so.  Overall mood, initiative, socialization is better. Less drowsy at  end of time here than when got 84 mg daily.  08/27/23 appt noted:  Med:  duloxetine  90, Concerta  36 mg AM, Lyrica  100 HS, no ropinirole .  No SE Received Spravato 84 mg today.   Experienced very mild dissociation and no sig HA, N.  But some dizziness.  Resolved by end of 2 hours observation.  Mildly drowsy at end of session.  Walking is some better with PT.  Wife agrees.  He's more interested, responsive, engaged.  Better cognition. After Spravato 56 he does not have to nap when goes home. After 84 mg needed to nap but he wants to increase back to 84 mg to get max effect. More sedate after 84 mg today but is able to clear by end of session.  But wants to continue it.  Over all doing pretty well  with less dep and more engagement and activity including spontaneously cleaning things.   09/10/23 appt noted:  Med:  duloxetine  90, Concerta  36 mg AM, Lyrica  100 HS, no ropinirole .  No SE Received Spravato 84 mg today.  Experienced very mild dissociation and no sig HA, N.  But some dizziness.  Resolved by end of 2 hours observation.  Mildly drowsy at end of session.  Mood continues to improve overall and anxiety better too.  Walking is improving.  Cognition and emotional engagment, activity are all better. Plan no med changes  09/24/23 appt noted: Med: Med:  duloxetine  90, Concerta  36 mg AM, Lyrica  100 HS, no ropinirole .  No SE Received Spravato 84 mg today.  Experienced very mild dissociation and no sig HA, N.  But some dizziness.  Resolved by end of 2 hours observation.  Mildly drowsy at end of session.  Mood continues to improve overall and anxiety better too.  Walking is improving.  Cognition and emotional engagment, activity are all better. Asks about reducing duloxetine  bc ED issues. Plan: reduce duloxetine  to 60 mg daily to reduce ED  10/08/23 appt Med: Med:  duloxetine  60, Concerta  36 mg AM, Lyrica  100 HS, no ropinirole .  Received Spravato 84 mg today.  Experienced very mild dissociation and no sig HA, N.  But some dizziness.  Resolved by end of 2 hours observation.  Mildly drowsy at end of session.  Some dysphoria  this week but he and wife continue to see improvement  and benefit with meds and Spravato.   SE concerta  feeling jittery and skips it at times.   10/15/23 appt noted:  Med :  duloxetine  60, Concerta  36 mg AM, Lyrica  100 HS, no ropinirole .  Received Spravato 84 mg today.  Experienced very mild dissociation and no sig HA, N.  But some dizziness.  Resolved by end of 2 hours observation.  Mildly drowsy at end of session.  he and wife continue to see improvement and benefit with meds and Spravato.   Minimal depression now. Stressed by nearly scammed this week by caller, but wife stopped. It.  She is concerned bc in the past he would not have been so easily deceived.  However he says they were just good and he ultimately didn't get scammed.  No concerns with current meds. Plan reduce Concerta  to 27 mg AM  10/22/23 appt noted:  Med :  duloxetine  60, Concerta  36 mg AM, Lyrica  100 HS, no ropinirole .  Received Spravato 84 mg today.  Experienced very mild dissociation and no sig HA, N.  But some dizziness.  Resolved by end of 2 hours observation.  Mildly drowsy at end of session.  he and wife continue to see improvement and benefit with meds and Spravato.   Minimal depression now. Hasn't reduced Concerta  yet.   Is wearing a bladder cath bc U retention.   Suspected prostate px in workup.  Mood is still improving.  Will be here next week then going to see GS in [place] for a week. Plan: as noted reduce Concerta  2 AM  10/29/23 appt noted;  Med :  duloxetine  60, Concerta  36 mg AM, Lyrica  100 HS, no ropinirole .  Received Spravato 84 mg today.   Experienced very mild dissociation and no sig HA, N.  But some dizziness.  Resolved by end of 2 hours observation.  Mildly drowsy at end of session.  he and wife continue to see improvement and benefit with meds and Spravato.   Minimal depression now. Hasn't reduced Concerta  yet.   Is wearing a bladder cath bc U retention.   Frustrated over this but gets it out tomorrow. Pretty good with mood. No concerns with meds.   Intermittent compliance with Concerta .   Wife concerns over poor judgment at times.  Like when makes financial decisions while receiving Spravato.  10/31/23 appt noted:  Med :  duloxetine  60, Concerta  36 mg AM, Lyrica  100 HS, no ropinirole .  Received Spravato 84 mg today.   Experienced very mild dissociation and no sig HA, N.  But some dizziness.  Resolved by end of 2 hours observation.  Mildly drowsy at end of session.  he and wife continue to see improvement and benefit with meds and Spravato.   Minimal depression now. Hasn't reduced Concerta  yet.   Is wearing a bladder cath bc U retention.   Frustrated over this. Mood is better markedly with Spravato.  Not as much OCD lately but distracted by health problems.    11/11/23 appt noted:  Med :  duloxetine  60, Concerta  36 mg AM, Lyrica  100 HS, no ropinirole .  Received Spravato 84 mg today.   Experienced very mild dissociation and no sig HA, N.  But some dizziness.  Resolved by end of 2 hours observation.  Mildly drowsy at end of session.  he and wife continue to see improvement and benefit with meds and Spravato.   Minimal depression now. Trip to [name] to visit  family went well sitting with GS.  Missed a Spravato admin.   Dep remained manageable generally.  Wife indicated he was negative this am but he feels better after Spravato.  Disc concerns abotu gait gradually better.  OCD is better.   Plan reduce Concerta  to 27 mg am  11/26/23 appt noted: Med :  duloxetine  60, Concerta  27 mg AM, Lyrica  100 HS, no ropinirole .  Received Spravato 84 mg today.   Experienced very mild dissociation and no sig HA, N.  But some dizziness.  Resolved by end of 2 hours observation.  Mildly drowsy at end of session.  he and wife continue to see improvement and benefit with meds and Spravato.   Minimal depression now. Done fine with less Concerta .  Mood is better and function better and wife agrees.  Satisfied with meds.   12/17/23 appt noted:  Med :  duloxetine   60, Concerta  27 mg AM, Lyrica  100 HS, no ropinirole .  Received Spravato 84 mg today.   No SE Experienced very mild dissociation and no sig HA, N.  But some dizziness.  Resolved by end of 2 hours observation.  Mildly drowsy at end of session.  he and wife continue to see improvement and benefit with meds and Spravato.   Tendency to do tasks under influence of Spravato against recommendation of staff and his wife's desires.  Minimal depression now.  OCD is still present but less anxiety from it now.  Doing well with meds without concerns.  Strength is not normal but has improved over time.  12/31/23 appt noted:  Med :  duloxetine  60, Concerta  27 mg AM, Lyrica  100 HS,.  Received Spravato 84 mg today.   No SE Experienced very mild dissociation and no sig HA, N.  But some dizziness.  Resolved by end of 2 hours observation.  Mildly drowsy at end of session.  Persistent weakness requiring WC when he leaves. he and wife continue to see improvement and benefit with meds and Spravato.  Both agree he is not sig dep today nor recently.  Is still tired physically and still hard to walk.  OCD and anxiety are pretty well controlled to with current tx plan. No med changes wanted.  01/14/24 appt noted:  Med :  duloxetine  60, Concerta  27 mg AM, Lyrica  100 HS,.  Received Spravato 84 mg today.   No SE Experienced very mild dissociation and no sig HA, N.  But some dizziness.  Resolved by end of 2 hours observation.  Mildly drowsy at end of session.  Persistent weakness requiring WC when he leaves. Still sees consistent benefit with Spravato and function better.  No problems with meds and no med changes wanted.    02/04/24 appt noted:  Med: duloxetine  60, Concerta  27 mg AM, Lyrica  100 HS,.  Received Spravato 84 mg today.   No SE Experienced very mild dissociation and no sig HA, N.  But some dizziness.  Resolved by end of 2 hours observation.  Mildly drowsy at end of session.  Persistent weakness requiring WC  when he leaves. Still sees consistent benefit with Spravato and function better.  Mood gradually better. Asking to increase Concerta  for better energy and focus and productivity.  Has had his BP checked and reassured by PCP he should be able to tolerate higher Concerta .   Plan: Increase MPH ER to 36 mg AM,   02/11/24 APPTNOTE: Med: duloxetine  60, MPH ER to 36 mg AM,  Lyrica  100 HS,.  No SE Received Spravato  84 mg today.   Experienced mild dissociation and no sig HA, N.  But some dizziness.  Resolved by end of 2 hours observation.  Mildly drowsy at end of session. More relaxed after administration.   Persistent weakness requiring WC when he leaves. Mood is improving.  No new concerns.  Still persistent weakness.  OCD manageable at this time.  Wife says still sleeps too much.  02/18/24 appt noted:  Med: duloxetine  60, MPH ER to 36 mg AM,  Lyrica  100 HS,.  No SE Received Spravato 84 mg today.   Experienced mild dissociation and no sig HA, N.  But some dizziness.  Resolved by end of 2 hours observation.  Mildly drowsy at end of session. More relaxed after administration.   Persistent weakness requiring WC when he leaves. Mood is improving.  No new concerns.  Still persistent weakness.  OCD manageable at this time.  Wife says still sleeps too much. Neuro sched DAT scan next week and questions how to address this.  Needs to be off MPH and duloxetine  3 days before  scan.  Questions how to do this.   ECT-MADRS    Flowsheet Row Office Visit from 06/18/2023 in [facility] Office Visit from 04/16/2023 in [facility]  MADRS Total Score 37 36   [id number]    Flowsheet Row Office Visit from 03/15/2020 in [facility] Total Score 3  PHQ-9 Total Score 9     B schizophrenic SUI. After M's death. PCP Cecil Ee at [facility] at 74 years old.  Prior psychiatric medication  trials include  Lexapro  20, citalopram NR, clomipramine weight gain, paroxetine, fluoxetine, Luvox, Trintellix,   Increase Lexapro  back to 20 mg January 2020. & 10/2020 bupropion ,  Auvelity NR  Abilify  10 fatigue,  Cerefolin NAC, and   Naltrexone  sexual SE Lyrica  150 tired  pramipexole,  ropinirole  Adderall XR & IR sexual SE,  Ritalin  30, Concerta  72 mg AM NR modafinil  and Nuvigil,   History [employer] OCD  Review of Systems:  Review of Systems  Constitutional:  Positive for fatigue. Negative for fever.  Cardiovascular:  Positive for palpitations. Negative for chest pain.  Genitourinary:        ED  Musculoskeletal:  Positive for arthralgias, gait problem and myalgias.  Neurological:  Positive for weakness. Negative for dizziness, tremors and syncope.  Psychiatric/Behavioral:  Negative for agitation, behavioral problems, confusion, decreased concentration, dysphoric mood, hallucinations, self-injury, sleep disturbance and suicidal ideas. The patient is nervous/anxious. The patient is not hyperactive.     Medications: I have reviewed the patient's current medications.  Current Outpatient Medications  Medication Sig Dispense Refill   aspirin 81 MG tablet Take 81 mg by mouth daily.     Cholecalciferol (VITAMIN D -3) 125 MCG (5000 UT) TABS Take 1 tablet (5,000 Units total) by mouth daily. 90 tablet 1   Cholecalciferol 50 MCG (2000 UT) CAPS 1 tablet Orally Once a day     DULoxetine  (CYMBALTA ) 30 MG capsule Take 60 mg by mouth daily.     Esketamine HCl, 84 MG Dose, (SPRAVATO, 84 MG DOSE,) 28 MG/DEVICE SOPK 3 sprays in each nostril Nasally Twice a week Starts this dose on 06/27/2023     methylphenidate  (CONCERTA ) 36 MG PO CR tablet Take 1 tablet (36 mg total) by mouth every morning. 30 tablet 0   naltrexone  (DEPADE) 50 MG tablet Take 0.5 tablets (25 mg total) by mouth  daily. 45 tablet 1   pregabalin  (LYRICA ) 100 MG capsule Take 100 mg by mouth daily.     simvastatin (ZOCOR) 20 MG  tablet Take 20 mg by mouth at bedtime.     ZEPBOUND  15 MG/0.5ML Pen Inject 15 mg into the skin once a week.     ALPRAZolam  (XANAX ) 0.25 MG tablet Take 1 tablet (0.25 mg total) by mouth 2 (two) times daily as needed for anxiety or sleep. (Patient not taking: Reported on 02/18/2024) 30 tablet 1   No current facility-administered medications for this visit.    Medication Side Effects: None sexual SE are better not  All gone.  Allergies:  Allergies  Allergen Reactions   E.E.S. [Erythromycin] Hives   Macrolides And Ketolides Other (See Comments)    EES    Rosuvastatin     Other reaction(s): cramps    Past Medical History:  Diagnosis Date   Allergy    Anemia    iron- pt denies    Anxiety    Aortic cusp regurgitation    Carotid artery occlusion    Constipation    Coronary artery stenosis    Hyperlipidemia    Lactose intolerance    Major depression, recurrent, chronic    Obesity    OCD (obsessive compulsive disorder)    OSA (obstructive sleep apnea)    Other chronic pain    Periodic limb movement disorder    Periodic limb movements of sleep    Prediabetes    Pure hypercholesterolemia    Restless legs    Sleep apnea    wears cpap    Vitamin D  deficiency     Family History  Problem Relation Age of Onset   Cancer Mother        breast and ovarian   Anxiety disorder Mother    Breast cancer Mother    Ovarian cancer Mother    Depression Father        bi-polar   Hyperlipidemia Father    Heart disease Father    Sudden death Father    Bipolar disorder Father    Sleep apnea Father    Obesity Father    Depression Son    Colon cancer Neg Hx    Colon polyps Neg Hx    Esophageal cancer Neg Hx    Rectal cancer Neg Hx    Stomach cancer Neg Hx     Social History   Socioeconomic History   Marital status: Married    Spouse name: Not on file   Number of children: Not on file   Years of education: Not on file   Highest education level: Not on file  Occupational History    Occupation: retired pensions consultant  Tobacco Use   Smoking status: Never   Smokeless tobacco: Never  Substance and Sexual Activity   Alcohol use: Yes    Comment: occasionally    Drug use: No   Sexual activity: Not on file  Other Topics Concern   Not on file  Social History Narrative   Not on file   Social Drivers of Health   Tobacco Use: Low Risk (02/18/2024)   Patient History    Smoking Tobacco Use: Never    Smokeless Tobacco Use: Never    Passive Exposure: Not on file  Financial Resource Strain: Not on file  Food Insecurity: No Food Insecurity (01/25/2022)   Received from [facility] visits prior to [facility]24/2022[facility]   Epic    Within the past  12 months, you worried that your food would run out before you got the money to buy more.: Never true    Within the past 12 months, the food you bought just didn't last and you didn't have money to get more.: Never true  Transportation Needs: No Transportation Needs (01/25/2022)   Received from [facility] visits prior to 03/24/2022.   PRAPARE - Administrator, Civil Service (Medical): No    Lack of Transportation (Non-Medical): No  Physical Activity: Not on file  Stress: Not on file  Social Connections: Not on file  Intimate Partner Violence: Not on file  Depression (EYV7-0): Not on file  Alcohol Screen: Not on file  Housing: Unknown (06/30/2023)   [facility] [facility]   Epic    Unable to Pay for Housing in the Last Year: Not on file    Number of Times Moved in the Last Year: Not on file    At any time in the past 12 months, were you homeless or living in a shelter (including now)?: No  Utilities: Not At Risk (01/25/2022)   Received from [facility] visits prior to 03/24/2022.   [facility] Utilities    Threatened with loss of utilities: No  Health Literacy: Not on file    Past Medical History, Surgical history, Social history,  and Family history were reviewed and updated as appropriate.   Please see review of systems for further details on the patient's review from today.   Objective:   Physical Exam:  There were no vitals taken for this visit.  Physical Exam Constitutional:      General: He is not in acute distress.    Appearance: He is obese.  Musculoskeletal:        General: No deformity.  Neurological:     Mental Status: He is alert and oriented to person, place, and time.     Cranial Nerves: No dysarthria.     Motor: Weakness present.     Coordination: Coordination abnormal.     Comments: Shortened gait is better.  No sig tremor.  less Slow but still not normal.  Psychiatric:        Attention and Perception: Attention and perception normal. He does not perceive auditory or visual hallucinations.        Mood and Affect: Mood is not anxious or depressed. Affect is not labile or blunt.        Speech: Speech normal.        Behavior: Behavior is not slowed. Behavior is cooperative.        Thought Content: Thought content normal. Thought content is not paranoid or delusional. Thought content does not include homicidal or suicidal ideation. Thought content does not include suicidal plan.        Cognition and Memory: Cognition and memory normal.        Judgment: Judgment normal.     Comments: Insight intact Depression manageable. OCD is about the same and not the main problem More expressive and more normal affect.  Quicker responses. Dep generally controlled     November 06, 2018: [place] Cog test in office within normal limits MMSE 28/30. Animal fluency 17 . (borderline) Taken as a whole, no indication to pursue neuropsychological testing.  Mini-Mental status exam 28/30 on 10/27/20.  No evidence of dementia.  Lab Review:   Vitamin D  level acceptable at 54.5.   Normal B12 and folate and TSH in  last couple of years..  Echocardiogram is stable re: AVR over the last 8 years and not likely the  cause of lethargy.  06/28/23 MRI head:  IMPRESSION: 1. No acute intracranial abnormality. 2. 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 Head CT may be helpful for further assessment of the degree of mineralization. 3. Findings of chronic small vessel ischemia.  SABRAres Assessment: Plan:    Aztlan was seen today for follow-up, depression, anxiety and add.  Diagnoses and all orders for this visit:  Recurrent major depression resistant to treatment  Mixed obsessional thoughts and acts  Attention deficit hyperactivity disorder (ADHD), predominantly inattentive type  Hypersomnia with sleep apnea  Mild cognitive impairment  Restless legs syndrome  Erectile disorder, acquired, generalized, moderate  Other chronic pain        Mr. Vencill has a long history of depression and OCD.  Major depression with fatigue, cognitive and physical slowness, anhedonia is much worse.  Started Spravato  and improving.  Was better energy with duloxetine  90 vs Lexapro  so increased to 120 mg daily DT worsening depression.    But it was not better. So reduced it back to 90 mg daily.    Now reduced to 60 mg daily to see if ED better and also bc recent U retention.  (Not likely related) now dep bettter with Spravato. Disc risk worsening dep and anxiety.   He has some compulsive checking and obsessions around the house maintenance.  When travels then tends to have less OCD bc triggered less.    Received 84 mg Spravato today. The patient experienced the typical dissociation which gradually resolved over the 2-hour period of observation.  There were no complications.  Specifically the patient did not have nausea or vomiting or headache.  Blood pressures remained within normal ranges at the 40-minute and 2-hour follow-up intervals.  By the time the 2-hour observation period was met the  patient was alert and oriented and able to exit without assistance.  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  See nursing note for further details. Emphasized need to relax with Spravato and not return calls, read, return chats, emails etc. Started Spravato 06/25/23. continue weekly @ 84 mg Strongly rec avoid making any decisions nor dealing with any verbal information during the Spravato procedure.  He is not compliant so far with this.  Disc in detail with him and his wife.  His OCD is chronic with checking lites, stove, etc. .  It is better at the moment DT depression being worse.  Disc CBT techniques and potential for more therapy to address. Option Dr. Marijean.  Ok increase again back to Concerta  36  mg AM  He wants to use it for depression and cognitive concerns.  Discussed potential benefits, risks, and side effects of stimulants with patient to include increased heart rate, palpitations, insomnia, increased anxiety, increased irritability, or decreased appetite.  Instructed patient to contact office if experiencing any significant tolerability issues.  Extensive discussion of sleep study and he has a copy.  Why is there virtually no N3 & REM sleep?  Is that affecting daytime alertness and fatigue?  Vs how much is related to mild OSA and PLMS?  Disc this in detail.  Wrote correspondence with sleep doc about it.  Options for sleep & fatigue:  Difficult to assess  bc [place] and then sick for 3 weeks.   Focus on reduction in OSA Focus on improving deep stage sleep.  ? Rx low dose mirtazapine or alternatives Focus on leg movements (hx RLS/PLMS) continue Trial as had been suggested Lyrica  for FM type sx and may help RLS/PLMS.  Better dreaming on 100 mg HS and too sleepy on 150.  Decreased  to 100 mg HS for sleep and back pain and RLS No ropinirole  needed.   Disc SE.  Not having any.    Use LED Xanax  and try to avoid BZ daytime bc fatigue.  He doesn't use it much daythime.  Using 0..25-0.5 mg at night.  May need less with more Lyrica  at [facility] and try to minimize.  No hangover. We discussed the short-term risks associated with benzodiazepines including sedation and increased fall risk among others.  Discussed long-term side effect risk including dependence, potential withdrawal symptoms, and the potential eventual dose-related risk of dementia.  But recent studies from 2020 dispute this association between benzodiazepines and dementia risk. Newer studies in 2020 do not support an association with dementia.  Stimulant partially successfully used off label to augment antidepressants for depression and have resulted in improved productivity and attention.    Previous screening of memory was not suggestive of any neuro degenerative process: Mini-Mental status exam 28/30 on 10/27/20.   Recent cognition worse as dep worsened.  Also gait is short and slow.   neuro appt.  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  Disc Dr. Collie note  01/30/24:Neuro sched DAT scan next week and questions how to address this.  Needs to be off MPH and duloxetine  3 days before  scan.  Questions how to do this.  Disc SE in detail and SSRI withdrawal sx.  Plan:  duloxetine  60 mg daily   Lyrica  100 mg HS.  MPH ER to 36 mg AM,    Spravato disc in detail.  He wants to continue.  He and wife notice he's better.  Administer weekly  He's still improving.   He is tolerating it better  Follow-up 1 week.  Disc option soon to try every other week with understanding he could lose benefit  Lorene Macintosh MD, DFAPA.  Please see After Visit Summary for patient specific instructions.  Future Appointments  Date Time Provider Department Center  02/26/2024 10:00 AM [facility]-NM INJ 1 [facility]  02/26/2024  3:00 PM MC-NM 2 [facility]NM [facility]  02/27/2024  9:00 AM Cottle, Lorene KANDICE Raddle., MD CP-CP None  02/27/2024  9:00 AM CP-NURSE CP-CP None   03/25/2024  9:45 AM Tat, Asberry RAMAN, DO [facility] None  04/02/2024  8:00 AM Gilgannon, Chloe N, PT [facility]     No orders of the defined types were placed in this encounter.      -------------------------------

## 2024-02-18 NOTE — Progress Notes (Signed)
 NURSES NOTE:         Pt arrived for his #41 Spravato Treatment for treatment resistant depression, the starting dose was 56 mg (2 of the 28 mg nasal sprays). Pt is getting 84 mg again today after receiving 3 of the 56 mg the past 3 weeks due to sedation.  Pauline is a patient of Dr. Calhoun so he will follow his care throughout treatments and follow ups. Pt's Spravato is a medical authorization through buy and bill.  Spravato medication is stored at treatment center per REMS/FDA guidelines. The medication is required to be locked behind two doors per REMS/FDA protocol. Medication is also disposed of properly after each use per regulations. All documentation for REMS is completed and submitted per FDA/REMS requirements.          Began taking patient's vital signs at 11:15 AM 131/75, pulse 72, SpO2 95%. Instructed patient to blow his nose if needed then recline back to a 45 degree angle. Gave patient first dose 28 mg nasal spray, administered in each nostril as directed and observed by nurse, waited 5 more minutes for the second and third dose.  After all doses given pt did not complain of any nausea/vomiting. Pt was given water, snacks, and candy. Assessed his 40 minute vitals, 11:55 AM, 135/63, pulse 69, SpO2 95%. Explained he would be monitored for a total time of 120 minutes. Discharge vitals were taken at 1:15 PM 133/67, P 69, SpO2 95%. Dr. Geoffry met with pt. to discuss his treatment once his thoughts were clearer. Recommend he go home and sleep or just relax on the couch. No driving, no intense activities. Verbalized understanding. Nurse was with pt a total of 60 minutes for clinical assessment. Pt's wife picked him up after treatment today. Instructed to call with any issues. Pt will return next Thursday due to having a DAT SCAN next Wednesday .   (84 mg) LOT [id number] EXP MARCH 2028

## 2024-02-26 ENCOUNTER — Encounter (HOSPITAL_COMMUNITY): Payer: Self-pay

## 2024-02-26 ENCOUNTER — Encounter (HOSPITAL_COMMUNITY): Admission: RE | Admit: 2024-02-26

## 2024-02-26 ENCOUNTER — Encounter (HOSPITAL_COMMUNITY): Admission: RE | Admit: 2024-02-26 | Source: Ambulatory Visit

## 2024-02-26 DIAGNOSIS — R251 Tremor, unspecified: Secondary | ICD-10-CM

## 2024-02-26 MED ORDER — IOFLUPANE I 123 185 MBQ/2.5ML IV SOLN
5.0000 | Freq: Once | INTRAVENOUS | Status: AC
  Administered 2024-02-26: 4.26 via INTRAVENOUS

## 2024-02-27 ENCOUNTER — Ambulatory Visit

## 2024-02-27 ENCOUNTER — Encounter: Admitting: Psychiatry

## 2024-02-27 ENCOUNTER — Ambulatory Visit: Payer: Self-pay | Admitting: Neurology

## 2024-02-27 VITALS — BP 124/65 | HR 63

## 2024-02-27 DIAGNOSIS — F339 Major depressive disorder, recurrent, unspecified: Secondary | ICD-10-CM

## 2024-02-27 NOTE — Progress Notes (Signed)
 NURSES NOTE:  Pt arrived for his #42 Spravato Treatment for treatment resistant depression, the starting dose was 56 mg (2 of the 28 mg nasal sprays). Pt is getting 84 mg again today after receiving 3 of the 56 mg the past 3 weeks due to sedation.  Kadon is a patient of Dr. Calhoun so he will follow his care throughout treatments and follow ups. Pt's Spravato is a medical authorization through buy and bill.  Spravato medication is stored at treatment center per REMS/FDA guidelines. The medication is required to be locked behind two doors per REMS/FDA protocol. Medication is also disposed of properly after each use per regulations. All documentation for REMS is completed and submitted per FDA/REMS requirements.          Began taking patient's vital signs at 9:11 AM 129/63, pulse 69, SpO2 95%. Instructed patient to blow his nose if needed then recline back to a 45 degree angle. Gave patient first dose 28 mg nasal spray, administered in each nostril as directed and observed by nurse, waited 5 more minutes for the second and third dose.  After all doses given pt did not complain of any nausea/vomiting. Pt was given water, snacks, and candy. Assessed his 40 minute vitals, 9:51 AM, 133/85, pulse 64, SpO2 95%. Explained he would be monitored for a total time of 120 minutes. Discharge vitals were taken at 11:11 AM 124/65, P 63, SpO2 96%. Dr. Geoffry met with pt. to discuss his treatment once his thoughts were clearer. Recommend he go home and sleep or just relax on the couch. No driving, no intense activities. Verbalized understanding. Nurse was with pt a total of 60 minutes for clinical assessment. Pt's wife picked him up after treatment today. Instructed to call with any issues. Pt will return next Tuesday .   (84 mg) LOT [id number] EXP MARCH 2028

## 2024-02-28 NOTE — Progress Notes (Unsigned)
 "   Assessment/Plan:  1.  Parkinsonism             - Start levodopa and work to carbidopa/levodopa 25/100, 1 tablet 3 times per day  - Schedule skin biopsy for alpha-synuclein             - DaTscan  February, 2026 with decreased activity in the bilateral stratum   2.  Tardive dyskinesia (prior history of exposure to Abilify  and Vraylar , although not on either from prolonged)             - Patient does definitely have some tardive movements of his tongue.  This is mild and not bothersome   3.  Abnormal brain scan             -there is heavy basal ganglia calcification in the BG, thalami, cerebellum.  Awaiting endocrine appointment (June) just to make sure no endocrinopathy (parathyroid, etc) as etiology.  Certainly, basal ganglia calcifications can be idiopathic as well as well as exposure to toxins (carbon monoxide, mercury, lead, etc.).  doubt fahrs             - Patient does have some mild small vessel disease on MRI.  We discussed nature and etiology of small vessel disease in this age group with his medical problems.  4.  Neurogenic bladder  - Patient following with urology.  He is performing self catheterization.  -he is following with Dr. Elisabeth  5.  Depression  -on spravato  Subjective:   Eric [name] was seen today in follow-up for parkinsonism.  DaTscan  was just completed February 4 and demonstrated decreased activity in the bilateral striata  ALLERGIES:   Allergies  Allergen Reactions   E.E.S. [Erythromycin] Hives   Macrolides And Ketolides Other (See Comments)    EES    Rosuvastatin     Other reaction(s): cramps    CURRENT MEDICATIONS:  No outpatient medications have been marked as taking for the 03/02/24 encounter (Appointment) with Porcia Morganti, Asberry RAMAN, DO.     Objective:   VITALS:   There were no vitals filed for this visit.    GEN:  The patient appears stated age and is in NAD. HEENT:  Normocephalic, atraumatic.  The tongue is thick but tongue is in the  mouth with some movements involuntarily.  The mucous membranes are moist.  CV:  RRR Lungs:  CTAB Neck/HEME:  There are no carotid bruits bilaterally.  Neurological examination:  Orientation: The patient is alert and oriented x3.  Cranial nerves: There is good facial symmetry. Extraocular muscles are intact. The visual fields are full to confrontational testing. The speech is fluent and clear. Soft palate rises symmetrically and there is no tongue deviation. Hearing is intact to conversational tone. Sensation: Sensation is intact to light touch throughout Motor: Strength is at least antigravity x 4  Movement examination: Tone: There is normal tone in the bilateral upper extremities.  The tone in the lower extremities is normal.  Abnormal movements: there is rare RUE rest tremor Coordination:  There is no decremation with RAM's, with any form of RAMS, including alternating supination and pronation of the forearm, hand opening and closing, finger taps, heel taps and toe taps.  Gait and Station: The patient has  difficulty arising out of a deep-seated chair without the use of the hands. The patient's stride length is decreased with decreased arm swing and a RUE rest tremor.     Total time spent on today's visit was 32 minutes, including  both face-to-face time and nonface-to-face time.  Time included that spent on review of records (prior notes available to me/labs/imaging if pertinent), discussing treatment and goals, answering patient's questions and coordinating care.  Cc:  Koirala, Dibas, MD  "

## 2024-03-02 ENCOUNTER — Ambulatory Visit: Admitting: Neurology

## 2024-03-03 ENCOUNTER — Encounter

## 2024-03-03 ENCOUNTER — Encounter: Admitting: Psychiatry

## 2024-03-10 ENCOUNTER — Encounter

## 2024-03-10 ENCOUNTER — Encounter: Admitting: Psychiatry

## 2024-03-17 ENCOUNTER — Encounter

## 2024-03-17 ENCOUNTER — Encounter: Admitting: Psychiatry

## 2024-03-25 ENCOUNTER — Telehealth: Payer: Self-pay | Admitting: Neurology

## 2024-04-02 ENCOUNTER — Ambulatory Visit: Admitting: Physical Therapy

## 2024-07-02 ENCOUNTER — Ambulatory Visit: Admitting: Endocrinology
